# Patient Record
Sex: Male | Born: 1945 | Race: White | Hispanic: No | Marital: Married | Smoking: Former smoker
Health system: Southern US, Community
[De-identification: ages and names within clinical notes are randomized; demographics above are authoritative.]

## PROBLEM LIST (undated history)

## (undated) DIAGNOSIS — IMO0001 Reserved for inherently not codable concepts without codable children: Secondary | ICD-10-CM

## (undated) DIAGNOSIS — M1990 Unspecified osteoarthritis, unspecified site: Secondary | ICD-10-CM

## (undated) DIAGNOSIS — C9000 Multiple myeloma not having achieved remission: Secondary | ICD-10-CM

## (undated) DIAGNOSIS — K219 Gastro-esophageal reflux disease without esophagitis: Secondary | ICD-10-CM

## (undated) DIAGNOSIS — F32A Depression, unspecified: Secondary | ICD-10-CM

## (undated) DIAGNOSIS — I209 Angina pectoris, unspecified: Secondary | ICD-10-CM

## (undated) DIAGNOSIS — E785 Hyperlipidemia, unspecified: Secondary | ICD-10-CM

## (undated) DIAGNOSIS — F419 Anxiety disorder, unspecified: Secondary | ICD-10-CM

## (undated) DIAGNOSIS — I2510 Atherosclerotic heart disease of native coronary artery without angina pectoris: Secondary | ICD-10-CM

## (undated) DIAGNOSIS — E119 Type 2 diabetes mellitus without complications: Secondary | ICD-10-CM

## (undated) DIAGNOSIS — I10 Essential (primary) hypertension: Secondary | ICD-10-CM

## (undated) DIAGNOSIS — F329 Major depressive disorder, single episode, unspecified: Secondary | ICD-10-CM

## (undated) DIAGNOSIS — I509 Heart failure, unspecified: Secondary | ICD-10-CM

## (undated) DIAGNOSIS — I219 Acute myocardial infarction, unspecified: Secondary | ICD-10-CM

## (undated) DIAGNOSIS — G4730 Sleep apnea, unspecified: Secondary | ICD-10-CM

## (undated) DIAGNOSIS — M4800 Spinal stenosis, site unspecified: Secondary | ICD-10-CM

## (undated) DIAGNOSIS — K5790 Diverticulosis of intestine, part unspecified, without perforation or abscess without bleeding: Secondary | ICD-10-CM

## (undated) HISTORY — PX: CATARACT EXTRACTION: SUR2

## (undated) HISTORY — DX: Heart failure, unspecified: I50.9

## (undated) HISTORY — DX: Hyperlipidemia, unspecified: E78.5

## (undated) HISTORY — PX: EYE SURGERY: SHX253

## (undated) HISTORY — PX: CORONARY ARTERY BYPASS GRAFT: SHX141

## (undated) HISTORY — PX: TOTAL HIP ARTHROPLASTY: SHX124

## (undated) HISTORY — PX: CARDIAC CATHETERIZATION: SHX172

## (undated) HISTORY — PX: ROTATOR CUFF REPAIR: SHX139

## (undated) HISTORY — DX: Spinal stenosis, site unspecified: M48.00

## (undated) HISTORY — PX: INGUINAL HERNIA REPAIR: SUR1180

## (undated) HISTORY — DX: Essential (primary) hypertension: I10

## (undated) HISTORY — DX: Gastro-esophageal reflux disease without esophagitis: K21.9

## (undated) HISTORY — PX: CARPAL TUNNEL RELEASE: SHX101

## (undated) HISTORY — PX: PILONIDAL CYST EXCISION: SHX744

## (undated) HISTORY — DX: Unspecified osteoarthritis, unspecified site: M19.90

## (undated) HISTORY — DX: Type 2 diabetes mellitus without complications: E11.9

## (undated) HISTORY — DX: Multiple myeloma not having achieved remission: C90.00

## (undated) HISTORY — DX: Angina pectoris, unspecified: I20.9

---

## 2000-02-07 DIAGNOSIS — I219 Acute myocardial infarction, unspecified: Secondary | ICD-10-CM

## 2000-02-07 HISTORY — DX: Acute myocardial infarction, unspecified: I21.9

## 2000-03-02 ENCOUNTER — Encounter: Payer: Self-pay | Admitting: Thoracic Surgery (Cardiothoracic Vascular Surgery)

## 2000-03-02 ENCOUNTER — Inpatient Hospital Stay (HOSPITAL_COMMUNITY): Admission: EM | Admit: 2000-03-02 | Discharge: 2000-03-10 | Payer: Self-pay | Admitting: *Deleted

## 2000-03-03 ENCOUNTER — Encounter: Payer: Self-pay | Admitting: Thoracic Surgery (Cardiothoracic Vascular Surgery)

## 2000-03-04 ENCOUNTER — Encounter: Payer: Self-pay | Admitting: Thoracic Surgery (Cardiothoracic Vascular Surgery)

## 2000-03-05 ENCOUNTER — Encounter: Payer: Self-pay | Admitting: Thoracic Surgery (Cardiothoracic Vascular Surgery)

## 2000-03-06 ENCOUNTER — Encounter: Payer: Self-pay | Admitting: Thoracic Surgery (Cardiothoracic Vascular Surgery)

## 2005-09-24 ENCOUNTER — Emergency Department: Payer: Self-pay | Admitting: Emergency Medicine

## 2005-09-24 ENCOUNTER — Other Ambulatory Visit: Payer: Self-pay

## 2005-10-07 ENCOUNTER — Ambulatory Visit: Payer: Self-pay | Admitting: Emergency Medicine

## 2006-11-08 HISTORY — PX: JOINT REPLACEMENT: SHX530

## 2006-12-27 ENCOUNTER — Inpatient Hospital Stay: Payer: Self-pay | Admitting: Unknown Physician Specialty

## 2006-12-27 ENCOUNTER — Other Ambulatory Visit: Payer: Self-pay

## 2008-12-21 ENCOUNTER — Emergency Department: Payer: Self-pay

## 2009-08-12 ENCOUNTER — Ambulatory Visit: Payer: Self-pay | Admitting: Unknown Physician Specialty

## 2009-08-18 ENCOUNTER — Ambulatory Visit: Payer: Self-pay | Admitting: Unknown Physician Specialty

## 2009-08-25 ENCOUNTER — Ambulatory Visit: Payer: Self-pay | Admitting: Internal Medicine

## 2009-09-08 ENCOUNTER — Ambulatory Visit: Payer: Self-pay | Admitting: Internal Medicine

## 2009-09-24 ENCOUNTER — Ambulatory Visit: Payer: Self-pay | Admitting: Unknown Physician Specialty

## 2010-03-27 ENCOUNTER — Ambulatory Visit: Payer: Self-pay | Admitting: Surgery

## 2010-04-13 ENCOUNTER — Emergency Department: Payer: Self-pay | Admitting: Emergency Medicine

## 2012-11-17 ENCOUNTER — Ambulatory Visit: Payer: Self-pay | Admitting: Gastroenterology

## 2012-12-01 ENCOUNTER — Ambulatory Visit: Payer: Self-pay | Admitting: Unknown Physician Specialty

## 2013-02-22 ENCOUNTER — Ambulatory Visit: Payer: Self-pay | Admitting: Internal Medicine

## 2013-03-01 ENCOUNTER — Ambulatory Visit: Payer: Self-pay | Admitting: Hematology and Oncology

## 2013-03-01 LAB — CBC CANCER CENTER
Basophil #: 0.1 x10 3/mm (ref 0.0–0.1)
Eosinophil %: 1.7 %
HGB: 12.8 g/dL — ABNORMAL LOW (ref 13.0–18.0)
Lymphocyte #: 1 x10 3/mm (ref 1.0–3.6)
Lymphocyte %: 20.4 %
MCH: 36.1 pg — ABNORMAL HIGH (ref 26.0–34.0)
MCHC: 35.4 g/dL (ref 32.0–36.0)
MCV: 102 fL — ABNORMAL HIGH (ref 80–100)
Monocyte #: 0.5 x10 3/mm (ref 0.2–1.0)
Neutrophil #: 3.4 x10 3/mm (ref 1.4–6.5)
Neutrophil %: 67 %
Platelet: 199 x10 3/mm (ref 150–440)
RBC: 3.54 10*6/uL — ABNORMAL LOW (ref 4.40–5.90)
RDW: 13.9 % (ref 11.5–14.5)
WBC: 5.1 x10 3/mm (ref 3.8–10.6)

## 2013-03-01 LAB — BASIC METABOLIC PANEL
Calcium, Total: 8.8 mg/dL (ref 8.5–10.1)
EGFR (Non-African Amer.): >60
Osmolality: 276 (ref 275–301)
Potassium: 4 mmol/L (ref 3.5–5.1)
Sodium: 135 mmol/L — ABNORMAL LOW (ref 136–145)

## 2013-03-05 LAB — PROT IMMUNOELECTROPHORES(ARMC)

## 2013-03-05 LAB — KAPPA/LAMBDA FREE LIGHT CHAINS (ARMC)

## 2013-03-06 ENCOUNTER — Ambulatory Visit: Payer: Self-pay | Admitting: Hematology and Oncology

## 2013-03-08 ENCOUNTER — Ambulatory Visit: Payer: Self-pay | Admitting: Hematology and Oncology

## 2013-03-09 LAB — CBC CANCER CENTER
Bands: 1 %
Comment - H1-Com2: NORMAL
HCT: 38 % — ABNORMAL LOW (ref 40.0–52.0)
HGB: 12.8 g/dL — ABNORMAL LOW (ref 13.0–18.0)
MCH: 34.9 pg — ABNORMAL HIGH (ref 26.0–34.0)
MCV: 104 fL — ABNORMAL HIGH (ref 80–100)
Monocytes: 10 %
Platelet: 214 x10 3/mm (ref 150–440)
RDW: 14.1 % (ref 11.5–14.5)
WBC: 4.1 x10 3/mm (ref 3.8–10.6)

## 2013-04-04 LAB — CBC CANCER CENTER
Basophil #: 0.1 x10 3/mm (ref 0.0–0.1)
Basophil %: 1.2 %
Eosinophil #: 0.1 x10 3/mm (ref 0.0–0.7)
Eosinophil %: 1.5 %
Lymphocyte %: 17.3 %
MCH: 36.6 pg — ABNORMAL HIGH (ref 26.0–34.0)
MCV: 103 fL — ABNORMAL HIGH (ref 80–100)
Monocyte %: 8.9 %
Neutrophil #: 4.8 x10 3/mm (ref 1.4–6.5)
Neutrophil %: 71.1 %
Platelet: 220 x10 3/mm (ref 150–440)
RDW: 13 % (ref 11.5–14.5)
WBC: 6.7 x10 3/mm (ref 3.8–10.6)

## 2013-04-04 LAB — BASIC METABOLIC PANEL
Co2: 27 mmol/L (ref 21–32)
Creatinine: 0.96 mg/dL (ref 0.60–1.30)
EGFR (African American): >60
Osmolality: 277 (ref 275–301)
Potassium: 4 mmol/L (ref 3.5–5.1)
Sodium: 134 mmol/L — ABNORMAL LOW (ref 136–145)

## 2013-04-08 ENCOUNTER — Ambulatory Visit: Payer: Self-pay | Admitting: Hematology and Oncology

## 2013-05-01 LAB — COMPREHENSIVE METABOLIC PANEL
Albumin: 3.3 g/dL — ABNORMAL LOW (ref 3.4–5.0)
Anion Gap: 8 (ref 7–16)
Bilirubin,Total: 0.4 mg/dL (ref 0.2–1.0)
Chloride: 97 mmol/L — ABNORMAL LOW (ref 98–107)
Co2: 27 mmol/L (ref 21–32)
Creatinine: 1.02 mg/dL (ref 0.60–1.30)
EGFR (African American): >60
EGFR (Non-African Amer.): >60
Glucose: 240 mg/dL — ABNORMAL HIGH (ref 65–99)
Potassium: 4.6 mmol/L (ref 3.5–5.1)
SGOT(AST): 23 U/L (ref 15–37)
SGPT (ALT): 44 U/L (ref 12–78)
Sodium: 132 mmol/L — ABNORMAL LOW (ref 136–145)

## 2013-05-01 LAB — CBC CANCER CENTER
Basophil %: 1.3 %
Eosinophil %: 10.5 %
HGB: 13 g/dL (ref 13.0–18.0)
Lymphocyte #: 0.7 x10 3/mm — ABNORMAL LOW (ref 1.0–3.6)
Lymphocyte %: 18.8 %
MCH: 36 pg — ABNORMAL HIGH (ref 26.0–34.0)
MCHC: 35.4 g/dL (ref 32.0–36.0)
Monocyte %: 13.6 %
RDW: 12.7 % (ref 11.5–14.5)
WBC: 3.6 x10 3/mm — ABNORMAL LOW (ref 3.8–10.6)

## 2013-05-08 ENCOUNTER — Ambulatory Visit: Payer: Self-pay | Admitting: Hematology and Oncology

## 2013-05-29 LAB — COMPREHENSIVE METABOLIC PANEL
Albumin: 3.4 g/dL (ref 3.4–5.0)
BUN: 14 mg/dL (ref 7–18)
Calcium, Total: 8.5 mg/dL (ref 8.5–10.1)
Co2: 28 mmol/L (ref 21–32)
Potassium: 4.7 mmol/L (ref 3.5–5.1)
SGOT(AST): 17 U/L (ref 15–37)
Sodium: 133 mmol/L — ABNORMAL LOW (ref 136–145)

## 2013-05-29 LAB — CBC CANCER CENTER
Basophil #: 0.2 x10 3/mm — ABNORMAL HIGH (ref 0.0–0.1)
Lymphocyte %: 14.3 %
MCH: 35.8 pg — ABNORMAL HIGH (ref 26.0–34.0)
MCHC: 35.7 g/dL (ref 32.0–36.0)
MCV: 100 fL (ref 80–100)
Monocyte #: 0.5 x10 3/mm (ref 0.2–1.0)
RDW: 13.5 % (ref 11.5–14.5)
WBC: 4.7 x10 3/mm (ref 3.8–10.6)

## 2013-05-30 LAB — PROT IMMUNOELECTROPHORES(ARMC)

## 2013-06-08 ENCOUNTER — Ambulatory Visit: Payer: Self-pay | Admitting: Hematology and Oncology

## 2013-07-03 LAB — CBC CANCER CENTER
Basophil #: 0 x10 3/mm (ref 0.0–0.1)
Basophil %: 0.4 %
Eosinophil #: 0 x10 3/mm (ref 0.0–0.7)
HCT: 35 % — ABNORMAL LOW (ref 40.0–52.0)
HGB: 12.4 g/dL — ABNORMAL LOW (ref 13.0–18.0)
Lymphocyte %: 10.2 %
MCHC: 35.5 g/dL (ref 32.0–36.0)
Monocyte #: 0.7 x10 3/mm (ref 0.2–1.0)
Monocyte %: 12.4 %
Neutrophil %: 77 %
Platelet: 227 x10 3/mm (ref 150–440)
RBC: 3.55 10*6/uL — ABNORMAL LOW (ref 4.40–5.90)
WBC: 5.5 x10 3/mm (ref 3.8–10.6)

## 2013-07-03 LAB — COMPREHENSIVE METABOLIC PANEL
Alkaline Phosphatase: 70 U/L (ref 50–136)
Calcium, Total: 8.5 mg/dL (ref 8.5–10.1)
Creatinine: 1.16 mg/dL (ref 0.60–1.30)
Glucose: 379 mg/dL — ABNORMAL HIGH (ref 65–99)
Osmolality: 288 (ref 275–301)
Total Protein: 7.2 g/dL (ref 6.4–8.2)

## 2013-07-05 LAB — PROT IMMUNOELECTROPHORES(ARMC)

## 2013-07-09 ENCOUNTER — Ambulatory Visit: Payer: Self-pay | Admitting: Hematology and Oncology

## 2013-07-31 LAB — CBC CANCER CENTER
Basophil #: 0.1 x10 3/mm (ref 0.0–0.1)
Basophil %: 2.5 %
Eosinophil #: 0.3 x10 3/mm (ref 0.0–0.7)
HGB: 12.2 g/dL — ABNORMAL LOW (ref 13.0–18.0)
Lymphocyte #: 0.8 x10 3/mm — ABNORMAL LOW (ref 1.0–3.6)
MCHC: 34.4 g/dL (ref 32.0–36.0)
Monocyte #: 0.6 x10 3/mm (ref 0.2–1.0)
Monocyte %: 18 %
Platelet: 192 x10 3/mm (ref 150–440)
RBC: 3.56 10*6/uL — ABNORMAL LOW (ref 4.40–5.90)
RDW: 14.7 % — ABNORMAL HIGH (ref 11.5–14.5)
WBC: 3.2 x10 3/mm — ABNORMAL LOW (ref 3.8–10.6)

## 2013-07-31 LAB — BASIC METABOLIC PANEL
Calcium, Total: 8.4 mg/dL — ABNORMAL LOW (ref 8.5–10.1)
Creatinine: 0.79 mg/dL (ref 0.60–1.30)
EGFR (African American): >60
EGFR (Non-African Amer.): >60
Glucose: 213 mg/dL — ABNORMAL HIGH (ref 65–99)
Osmolality: 284 (ref 275–301)
Potassium: 3.9 mmol/L (ref 3.5–5.1)
Sodium: 140 mmol/L (ref 136–145)

## 2013-08-06 LAB — CBC
HCT: 35.8 % — ABNORMAL LOW (ref 40.0–52.0)
HGB: 12.7 g/dL — ABNORMAL LOW (ref 13.0–18.0)
MCH: 34.2 pg — ABNORMAL HIGH (ref 26.0–34.0)
MCHC: 35.4 g/dL (ref 32.0–36.0)
Platelet: 210 10*3/uL (ref 150–440)
WBC: 4.8 10*3/uL (ref 3.8–10.6)

## 2013-08-07 ENCOUNTER — Inpatient Hospital Stay: Payer: Self-pay | Admitting: Internal Medicine

## 2013-08-07 LAB — TROPONIN I: Troponin-I: <0.02 ng/mL

## 2013-08-07 LAB — URINALYSIS, COMPLETE
Bilirubin,UR: NEGATIVE
Blood: NEGATIVE
Glucose,UR: 50 mg/dL (ref 0–75)
Leukocyte Esterase: NEGATIVE
Nitrite: NEGATIVE
Ph: 5 (ref 4.5–8.0)
RBC,UR: 1 /HPF (ref 0–5)
Specific Gravity: 1.023 (ref 1.003–1.030)
WBC UR: 1 /HPF (ref 0–5)

## 2013-08-07 LAB — COMPREHENSIVE METABOLIC PANEL
Albumin: 3.3 g/dL — ABNORMAL LOW (ref 3.4–5.0)
Alkaline Phosphatase: 63 U/L (ref 50–136)
Anion Gap: 8 (ref 7–16)
BUN: 13 mg/dL (ref 7–18)
Bilirubin,Total: 0.2 mg/dL (ref 0.2–1.0)
Calcium, Total: 8.7 mg/dL (ref 8.5–10.1)
Chloride: 104 mmol/L (ref 98–107)
Co2: 23 mmol/L (ref 21–32)
EGFR (African American): >60
EGFR (Non-African Amer.): >60
SGPT (ALT): 25 U/L (ref 12–78)
Sodium: 135 mmol/L — ABNORMAL LOW (ref 136–145)
Total Protein: 7.2 g/dL (ref 6.4–8.2)

## 2013-08-07 LAB — AMYLASE: Amylase: 107 U/L (ref 25–115)

## 2013-08-07 LAB — CK TOTAL AND CKMB (NOT AT ARMC): CK, Total: 65 U/L (ref 35–232)

## 2013-08-08 ENCOUNTER — Ambulatory Visit: Payer: Self-pay | Admitting: Hematology and Oncology

## 2013-08-08 LAB — CBC WITH DIFFERENTIAL/PLATELET
Basophil #: 0.1 10*3/uL (ref 0.0–0.1)
Eosinophil #: 0 10*3/uL (ref 0.0–0.7)
HCT: 34.9 % — ABNORMAL LOW (ref 40.0–52.0)
Lymphocyte #: 0.6 10*3/uL — ABNORMAL LOW (ref 1.0–3.6)
Lymphocyte %: 7.5 %
MCH: 34.5 pg — ABNORMAL HIGH (ref 26.0–34.0)
MCHC: 34.9 g/dL (ref 32.0–36.0)
MCV: 99 fL (ref 80–100)
Monocyte #: 0.7 x10 3/mm (ref 0.2–1.0)
Monocyte %: 10 %
Neutrophil %: 80.8 %
Platelet: 178 10*3/uL (ref 150–440)
RBC: 3.53 10*6/uL — ABNORMAL LOW (ref 4.40–5.90)
RDW: 15.1 % — ABNORMAL HIGH (ref 11.5–14.5)
WBC: 7.5 10*3/uL (ref 3.8–10.6)

## 2013-08-08 LAB — BASIC METABOLIC PANEL
Anion Gap: 6 — ABNORMAL LOW (ref 7–16)
BUN: 7 mg/dL (ref 7–18)
Calcium, Total: 7.7 mg/dL — ABNORMAL LOW (ref 8.5–10.1)
Co2: 25 mmol/L (ref 21–32)
EGFR (Non-African Amer.): >60
Glucose: 135 mg/dL — ABNORMAL HIGH (ref 65–99)
Osmolality: 272 (ref 275–301)
Potassium: 4 mmol/L (ref 3.5–5.1)
Sodium: 136 mmol/L (ref 136–145)

## 2013-08-09 LAB — CBC WITH DIFFERENTIAL/PLATELET
Basophil #: 0.1 10*3/uL (ref 0.0–0.1)
Basophil %: 1.2 %
Eosinophil #: 0.1 10*3/uL (ref 0.0–0.7)
Eosinophil %: 2 %
Lymphocyte #: 0.6 10*3/uL — ABNORMAL LOW (ref 1.0–3.6)
Lymphocyte %: 8.7 %
MCHC: 35.2 g/dL (ref 32.0–36.0)
MCV: 98 fL (ref 80–100)
Monocyte #: 0.5 x10 3/mm (ref 0.2–1.0)
Monocyte %: 6.8 %
Neutrophil %: 81.3 %
Platelet: 173 10*3/uL (ref 150–440)
RBC: 3.32 10*6/uL — ABNORMAL LOW (ref 4.40–5.90)

## 2013-08-09 LAB — BASIC METABOLIC PANEL
Co2: 24 mmol/L (ref 21–32)
Creatinine: 0.79 mg/dL (ref 0.60–1.30)
EGFR (African American): >60
Glucose: 97 mg/dL (ref 65–99)
Osmolality: 275 (ref 275–301)
Sodium: 139 mmol/L (ref 136–145)

## 2013-08-14 LAB — BASIC METABOLIC PANEL
Anion Gap: 10 (ref 7–16)
BUN: 12 mg/dL (ref 7–18)
Chloride: 102 mmol/L (ref 98–107)
Co2: 27 mmol/L (ref 21–32)
EGFR (Non-African Amer.): >60
Glucose: 184 mg/dL — ABNORMAL HIGH (ref 65–99)
Potassium: 3.1 mmol/L — ABNORMAL LOW (ref 3.5–5.1)
Sodium: 139 mmol/L (ref 136–145)

## 2013-08-14 LAB — CBC CANCER CENTER
Basophil %: 1.1 %
Eosinophil #: 0.3 x10 3/mm (ref 0.0–0.7)
Eosinophil %: 5.1 %
HCT: 37.1 % — ABNORMAL LOW (ref 40.0–52.0)
Lymphocyte #: 0.7 x10 3/mm — ABNORMAL LOW (ref 1.0–3.6)
Lymphocyte %: 12.4 %
MCH: 33.9 pg (ref 26.0–34.0)
MCHC: 34.9 g/dL (ref 32.0–36.0)
MCV: 97 fL (ref 80–100)
Monocyte %: 10.3 %
Platelet: 326 x10 3/mm (ref 150–440)
WBC: 5.5 x10 3/mm (ref 3.8–10.6)

## 2013-08-30 LAB — CBC CANCER CENTER
Basophil %: 3.4 %
HCT: 39.6 % — ABNORMAL LOW (ref 40.0–52.0)
HGB: 13.7 g/dL (ref 13.0–18.0)
Lymphocyte %: 24.5 %
MCH: 33.6 pg (ref 26.0–34.0)
Monocyte #: 0.7 x10 3/mm (ref 0.2–1.0)
Neutrophil #: 1.6 x10 3/mm (ref 1.4–6.5)
Neutrophil %: 41.4 %
Platelet: 207 x10 3/mm (ref 150–440)
RBC: 4.07 10*6/uL — ABNORMAL LOW (ref 4.40–5.90)
WBC: 3.8 x10 3/mm (ref 3.8–10.6)

## 2013-08-30 LAB — BASIC METABOLIC PANEL
Anion Gap: 12 (ref 7–16)
Calcium, Total: 7.8 mg/dL — ABNORMAL LOW (ref 8.5–10.1)
Chloride: 102 mmol/L (ref 98–107)
EGFR (Non-African Amer.): >60
Glucose: 140 mg/dL — ABNORMAL HIGH (ref 65–99)
Osmolality: 277 (ref 275–301)
Potassium: 4.1 mmol/L (ref 3.5–5.1)

## 2013-09-03 LAB — PROT IMMUNOELECTROPHORES(ARMC)

## 2013-09-08 ENCOUNTER — Ambulatory Visit: Payer: Self-pay | Admitting: Hematology and Oncology

## 2013-10-03 LAB — CBC CANCER CENTER
Basophil #: 0.2 x10 3/mm — ABNORMAL HIGH (ref 0.0–0.1)
Eosinophil #: 0.6 x10 3/mm (ref 0.0–0.7)
HGB: 13.3 g/dL (ref 13.0–18.0)
Lymphocyte #: 1.1 x10 3/mm (ref 1.0–3.6)
MCH: 32.9 pg (ref 26.0–34.0)
MCHC: 33.4 g/dL (ref 32.0–36.0)
MCV: 99 fL (ref 80–100)
Monocyte %: 14.2 %
Neutrophil %: 29.4 %
Platelet: 168 x10 3/mm (ref 150–440)
RBC: 4.05 10*6/uL — ABNORMAL LOW (ref 4.40–5.90)
RDW: 15.7 % — ABNORMAL HIGH (ref 11.5–14.5)
WBC: 3.2 x10 3/mm — ABNORMAL LOW (ref 3.8–10.6)

## 2013-10-03 LAB — COMPREHENSIVE METABOLIC PANEL
Alkaline Phosphatase: 48 U/L
Anion Gap: 12 (ref 7–16)
BUN: 13 mg/dL (ref 7–18)
Calcium, Total: 8.4 mg/dL — ABNORMAL LOW (ref 8.5–10.1)
EGFR (African American): >60
EGFR (Non-African Amer.): >60
Glucose: 114 mg/dL — ABNORMAL HIGH (ref 65–99)

## 2013-10-08 ENCOUNTER — Ambulatory Visit: Payer: Self-pay | Admitting: Hematology and Oncology

## 2013-10-30 LAB — BASIC METABOLIC PANEL
Chloride: 103 mmol/L (ref 98–107)
Creatinine: 0.84 mg/dL (ref 0.60–1.30)
EGFR (Non-African Amer.): >60
Glucose: 121 mg/dL — ABNORMAL HIGH (ref 65–99)
Osmolality: 279 (ref 275–301)
Potassium: 3.9 mmol/L (ref 3.5–5.1)
Sodium: 139 mmol/L (ref 136–145)

## 2013-10-30 LAB — CBC CANCER CENTER
Basophil #: 0.1 x10 3/mm (ref 0.0–0.1)
Basophil %: 4.4 %
Eosinophil %: 13.1 %
HGB: 14 g/dL (ref 13.0–18.0)
Lymphocyte %: 35.6 %
MCHC: 33.5 g/dL (ref 32.0–36.0)
MCV: 99 fL (ref 80–100)
Monocyte #: 0.5 x10 3/mm (ref 0.2–1.0)
Monocyte %: 17.9 %
Neutrophil #: 0.9 x10 3/mm — ABNORMAL LOW (ref 1.4–6.5)
Neutrophil %: 29 %
Platelet: 203 x10 3/mm (ref 150–440)
RDW: 16.2 % — ABNORMAL HIGH (ref 11.5–14.5)
WBC: 3 x10 3/mm — ABNORMAL LOW (ref 3.8–10.6)

## 2013-11-02 LAB — PROT IMMUNOELECTROPHORES(ARMC)

## 2013-11-08 ENCOUNTER — Ambulatory Visit: Payer: Self-pay | Admitting: Hematology and Oncology

## 2013-11-27 LAB — COMPREHENSIVE METABOLIC PANEL
ALT: 36 U/L (ref 12–78)
Albumin: 3.5 g/dL (ref 3.4–5.0)
Alkaline Phosphatase: 70 U/L
Anion Gap: 4 — ABNORMAL LOW (ref 7–16)
BUN: 12 mg/dL (ref 7–18)
Bilirubin,Total: 0.3 mg/dL (ref 0.2–1.0)
CHLORIDE: 106 mmol/L (ref 98–107)
CO2: 25 mmol/L (ref 21–32)
Calcium, Total: 8.2 mg/dL — ABNORMAL LOW (ref 8.5–10.1)
Creatinine: 0.84 mg/dL (ref 0.60–1.30)
Glucose: 99 mg/dL (ref 65–99)
Osmolality: 270 (ref 275–301)
POTASSIUM: 4 mmol/L (ref 3.5–5.1)
SGOT(AST): 40 U/L — ABNORMAL HIGH (ref 15–37)
Sodium: 135 mmol/L — ABNORMAL LOW (ref 136–145)
Total Protein: 8.1 g/dL (ref 6.4–8.2)

## 2013-11-27 LAB — CBC CANCER CENTER
BASOS PCT: 3.9 %
Basophil #: 0.2 x10 3/mm — ABNORMAL HIGH (ref 0.0–0.1)
EOS ABS: 0.9 x10 3/mm — AB (ref 0.0–0.7)
Eosinophil %: 21.3 %
HCT: 40.8 % (ref 40.0–52.0)
HGB: 14 g/dL (ref 13.0–18.0)
LYMPHS ABS: 1.1 x10 3/mm (ref 1.0–3.6)
Lymphocyte %: 26.1 %
MCH: 34.2 pg — ABNORMAL HIGH (ref 26.0–34.0)
MCHC: 34.4 g/dL (ref 32.0–36.0)
MCV: 100 fL (ref 80–100)
Monocyte #: 0.6 x10 3/mm (ref 0.2–1.0)
Monocyte %: 15.6 %
NEUTROS PCT: 33.1 %
Neutrophil #: 1.4 x10 3/mm (ref 1.4–6.5)
Platelet: 196 x10 3/mm (ref 150–440)
RBC: 4.1 10*6/uL — ABNORMAL LOW (ref 4.40–5.90)
RDW: 15.7 % — ABNORMAL HIGH (ref 11.5–14.5)
WBC: 4.1 x10 3/mm (ref 3.8–10.6)

## 2013-11-29 LAB — PROT IMMUNOELECTROPHORES(ARMC)

## 2013-12-09 ENCOUNTER — Ambulatory Visit: Payer: Self-pay | Admitting: Hematology and Oncology

## 2013-12-28 LAB — CBC CANCER CENTER
BASOS PCT: 0.6 %
Basophil #: 0 x10 3/mm (ref 0.0–0.1)
Eosinophil #: 0.5 x10 3/mm (ref 0.0–0.7)
Eosinophil %: 13.8 %
HCT: 40.5 % (ref 40.0–52.0)
HGB: 13.6 g/dL (ref 13.0–18.0)
Lymphocyte #: 1.1 x10 3/mm (ref 1.0–3.6)
Lymphocyte %: 32.6 %
MCH: 34.2 pg — ABNORMAL HIGH (ref 26.0–34.0)
MCHC: 33.7 g/dL (ref 32.0–36.0)
MCV: 102 fL — ABNORMAL HIGH (ref 80–100)
Monocyte #: 0.4 x10 3/mm (ref 0.2–1.0)
Monocyte %: 12.3 %
Neutrophil #: 1.4 x10 3/mm (ref 1.4–6.5)
Neutrophil %: 40.7 %
Platelet: 167 x10 3/mm (ref 150–440)
RBC: 3.98 10*6/uL — AB (ref 4.40–5.90)
RDW: 14.7 % — ABNORMAL HIGH (ref 11.5–14.5)
WBC: 3.4 x10 3/mm — ABNORMAL LOW (ref 3.8–10.6)

## 2013-12-28 LAB — COMPREHENSIVE METABOLIC PANEL
ALK PHOS: 56 U/L
AST: 26 U/L (ref 15–37)
Albumin: 3.6 g/dL (ref 3.4–5.0)
Anion Gap: 9 (ref 7–16)
BUN: 10 mg/dL (ref 7–18)
Bilirubin,Total: 0.4 mg/dL (ref 0.2–1.0)
CALCIUM: 8 mg/dL — AB (ref 8.5–10.1)
Chloride: 103 mmol/L (ref 98–107)
Co2: 27 mmol/L (ref 21–32)
Creatinine: 0.84 mg/dL (ref 0.60–1.30)
EGFR (African American): >60
EGFR (Non-African Amer.): >60
Glucose: 77 mg/dL (ref 65–99)
Osmolality: 275 (ref 275–301)
POTASSIUM: 4.6 mmol/L (ref 3.5–5.1)
SGPT (ALT): 29 U/L (ref 12–78)
Sodium: 139 mmol/L (ref 136–145)
Total Protein: 8.1 g/dL (ref 6.4–8.2)

## 2014-01-06 ENCOUNTER — Ambulatory Visit: Payer: Self-pay | Admitting: Hematology and Oncology

## 2014-01-11 DIAGNOSIS — I25810 Atherosclerosis of coronary artery bypass graft(s) without angina pectoris: Secondary | ICD-10-CM | POA: Insufficient documentation

## 2014-01-11 DIAGNOSIS — K219 Gastro-esophageal reflux disease without esophagitis: Secondary | ICD-10-CM | POA: Insufficient documentation

## 2014-01-11 DIAGNOSIS — F17200 Nicotine dependence, unspecified, uncomplicated: Secondary | ICD-10-CM | POA: Insufficient documentation

## 2014-01-11 DIAGNOSIS — E782 Mixed hyperlipidemia: Secondary | ICD-10-CM | POA: Insufficient documentation

## 2014-01-25 LAB — CBC CANCER CENTER
Basophil #: 0.1 x10 3/mm (ref 0.0–0.1)
Basophil %: 4.2 %
Eosinophil #: 0.4 x10 3/mm (ref 0.0–0.7)
Eosinophil %: 13.7 %
HCT: 40 % (ref 40.0–52.0)
HGB: 13.5 g/dL (ref 13.0–18.0)
Lymphocyte #: 0.9 x10 3/mm — ABNORMAL LOW (ref 1.0–3.6)
Lymphocyte %: 32.3 %
MCH: 35.3 pg — AB (ref 26.0–34.0)
MCHC: 33.7 g/dL (ref 32.0–36.0)
MCV: 105 fL — ABNORMAL HIGH (ref 80–100)
Monocyte #: 0.4 x10 3/mm (ref 0.2–1.0)
Monocyte %: 14 %
NEUTROS ABS: 1 x10 3/mm — AB (ref 1.4–6.5)
Neutrophil %: 35.8 %
Platelet: 191 x10 3/mm (ref 150–440)
RBC: 3.82 10*6/uL — ABNORMAL LOW (ref 4.40–5.90)
RDW: 14.4 % (ref 11.5–14.5)
WBC: 2.9 x10 3/mm — AB (ref 3.8–10.6)

## 2014-01-25 LAB — BASIC METABOLIC PANEL
Anion Gap: 4 — ABNORMAL LOW (ref 7–16)
BUN: 12 mg/dL (ref 7–18)
CALCIUM: 8.2 mg/dL — AB (ref 8.5–10.1)
Chloride: 105 mmol/L (ref 98–107)
Co2: 28 mmol/L (ref 21–32)
Creatinine: 0.82 mg/dL (ref 0.60–1.30)
EGFR (Non-African Amer.): >60
GLUCOSE: 113 mg/dL — AB (ref 65–99)
OSMOLALITY: 274 (ref 275–301)
POTASSIUM: 4.1 mmol/L (ref 3.5–5.1)
Sodium: 137 mmol/L (ref 136–145)

## 2014-01-28 LAB — PROT IMMUNOELECTROPHORES(ARMC)

## 2014-02-06 ENCOUNTER — Ambulatory Visit: Payer: Self-pay | Admitting: Hematology and Oncology

## 2014-02-19 DIAGNOSIS — C4492 Squamous cell carcinoma of skin, unspecified: Secondary | ICD-10-CM

## 2014-02-19 HISTORY — DX: Squamous cell carcinoma of skin, unspecified: C44.92

## 2014-02-28 LAB — BASIC METABOLIC PANEL
Anion Gap: 13 (ref 7–16)
BUN: 13 mg/dL (ref 7–18)
CALCIUM: 8.8 mg/dL (ref 8.5–10.1)
CREATININE: 0.85 mg/dL (ref 0.60–1.30)
Chloride: 104 mmol/L (ref 98–107)
Co2: 24 mmol/L (ref 21–32)
EGFR (African American): >60
EGFR (Non-African Amer.): >60
Glucose: 94 mg/dL (ref 65–99)
Osmolality: 281 (ref 275–301)
Potassium: 4.2 mmol/L (ref 3.5–5.1)
Sodium: 141 mmol/L (ref 136–145)

## 2014-02-28 LAB — CBC CANCER CENTER
BASOS ABS: 0.1 x10 3/mm (ref 0.0–0.1)
BASOS PCT: 4.3 %
Eosinophil #: 0.4 x10 3/mm (ref 0.0–0.7)
Eosinophil %: 15.2 %
HCT: 39.7 % — ABNORMAL LOW (ref 40.0–52.0)
HGB: 13.5 g/dL (ref 13.0–18.0)
LYMPHS ABS: 0.9 x10 3/mm — AB (ref 1.0–3.6)
LYMPHS PCT: 31.2 %
MCH: 36.1 pg — AB (ref 26.0–34.0)
MCHC: 34 g/dL (ref 32.0–36.0)
MCV: 106 fL — AB (ref 80–100)
MONO ABS: 0.4 x10 3/mm (ref 0.2–1.0)
Monocyte %: 12.7 %
NEUTROS PCT: 36.6 %
Neutrophil #: 1.1 x10 3/mm — ABNORMAL LOW (ref 1.4–6.5)
Platelet: 169 x10 3/mm (ref 150–440)
RBC: 3.74 10*6/uL — AB (ref 4.40–5.90)
RDW: 15.2 % — AB (ref 11.5–14.5)
WBC: 2.9 x10 3/mm — ABNORMAL LOW (ref 3.8–10.6)

## 2014-03-08 ENCOUNTER — Ambulatory Visit: Payer: Self-pay | Admitting: Hematology and Oncology

## 2014-03-14 DIAGNOSIS — R5383 Other fatigue: Secondary | ICD-10-CM | POA: Insufficient documentation

## 2014-03-14 DIAGNOSIS — R079 Chest pain, unspecified: Secondary | ICD-10-CM | POA: Insufficient documentation

## 2014-03-14 DIAGNOSIS — R531 Weakness: Secondary | ICD-10-CM | POA: Insufficient documentation

## 2014-03-14 DIAGNOSIS — R0602 Shortness of breath: Secondary | ICD-10-CM | POA: Insufficient documentation

## 2014-03-28 DIAGNOSIS — M1990 Unspecified osteoarthritis, unspecified site: Secondary | ICD-10-CM | POA: Insufficient documentation

## 2014-03-29 LAB — CBC CANCER CENTER
Basophil #: 0 x10 3/mm (ref 0.0–0.1)
Basophil %: 1.3 %
EOS PCT: 0.3 %
Eosinophil #: 0 x10 3/mm (ref 0.0–0.7)
HCT: 37.4 % — AB (ref 40.0–52.0)
HGB: 13.2 g/dL (ref 13.0–18.0)
Lymphocyte #: 0.5 x10 3/mm — ABNORMAL LOW (ref 1.0–3.6)
Lymphocyte %: 17.5 %
MCH: 36.8 pg — ABNORMAL HIGH (ref 26.0–34.0)
MCHC: 35.2 g/dL (ref 32.0–36.0)
MCV: 105 fL — AB (ref 80–100)
MONO ABS: 0.4 x10 3/mm (ref 0.2–1.0)
MONOS PCT: 14.6 %
Neutrophil #: 2 x10 3/mm (ref 1.4–6.5)
Neutrophil %: 66.3 %
Platelet: 207 x10 3/mm (ref 150–440)
RBC: 3.58 10*6/uL — ABNORMAL LOW (ref 4.40–5.90)
RDW: 13.5 % (ref 11.5–14.5)
WBC: 3 x10 3/mm — ABNORMAL LOW (ref 3.8–10.6)

## 2014-03-29 LAB — BASIC METABOLIC PANEL
ANION GAP: 11 (ref 7–16)
BUN: 18 mg/dL (ref 7–18)
CALCIUM: 8.7 mg/dL (ref 8.5–10.1)
CO2: 25 mmol/L (ref 21–32)
Chloride: 101 mmol/L (ref 98–107)
Creatinine: 0.96 mg/dL (ref 0.60–1.30)
EGFR (African American): >60
EGFR (Non-African Amer.): >60
Glucose: 153 mg/dL — ABNORMAL HIGH (ref 65–99)
Osmolality: 279 (ref 275–301)
POTASSIUM: 4.8 mmol/L (ref 3.5–5.1)
SODIUM: 137 mmol/L (ref 136–145)

## 2014-04-08 ENCOUNTER — Ambulatory Visit: Payer: Self-pay | Admitting: Hematology and Oncology

## 2014-05-03 LAB — COMPREHENSIVE METABOLIC PANEL
ALK PHOS: 49 U/L
AST: 22 U/L (ref 15–37)
Albumin: 3.5 g/dL (ref 3.4–5.0)
Anion Gap: 10 (ref 7–16)
BUN: 11 mg/dL (ref 7–18)
Bilirubin,Total: 0.4 mg/dL (ref 0.2–1.0)
CALCIUM: 8.7 mg/dL (ref 8.5–10.1)
Chloride: 102 mmol/L (ref 98–107)
Co2: 27 mmol/L (ref 21–32)
Creatinine: 0.9 mg/dL (ref 0.60–1.30)
EGFR (African American): >60
EGFR (Non-African Amer.): >60
GLUCOSE: 127 mg/dL — AB (ref 65–99)
OSMOLALITY: 279 (ref 275–301)
POTASSIUM: 4.5 mmol/L (ref 3.5–5.1)
SGPT (ALT): 31 U/L (ref 12–78)
Sodium: 139 mmol/L (ref 136–145)
Total Protein: 7.9 g/dL (ref 6.4–8.2)

## 2014-05-03 LAB — CBC CANCER CENTER
BASOS ABS: 0.1 x10 3/mm (ref 0.0–0.1)
Basophil %: 4.5 %
Eosinophil #: 0.3 x10 3/mm (ref 0.0–0.7)
Eosinophil %: 12.6 %
HCT: 38.9 % — ABNORMAL LOW (ref 40.0–52.0)
HGB: 13.1 g/dL (ref 13.0–18.0)
LYMPHS PCT: 29.7 %
Lymphocyte #: 0.8 x10 3/mm — ABNORMAL LOW (ref 1.0–3.6)
MCH: 36.6 pg — ABNORMAL HIGH (ref 26.0–34.0)
MCHC: 33.7 g/dL (ref 32.0–36.0)
MCV: 108 fL — ABNORMAL HIGH (ref 80–100)
MONO ABS: 0.3 x10 3/mm (ref 0.2–1.0)
Monocyte %: 12.6 %
NEUTROS PCT: 40.6 %
Neutrophil #: 1 x10 3/mm — ABNORMAL LOW (ref 1.4–6.5)
PLATELETS: 159 x10 3/mm (ref 150–440)
RBC: 3.59 10*6/uL — AB (ref 4.40–5.90)
RDW: 14.1 % (ref 11.5–14.5)
WBC: 2.5 x10 3/mm — ABNORMAL LOW (ref 3.8–10.6)

## 2014-05-03 LAB — LACTATE DEHYDROGENASE: LDH: 163 U/L (ref 85–241)

## 2014-05-03 LAB — PROTIME-INR
INR: 1
Prothrombin Time: 12.7 secs (ref 11.5–14.7)

## 2014-05-03 LAB — FOLATE: FOLIC ACID: 11.4 ng/mL (ref 3.1–100.0)

## 2014-05-07 LAB — PROT IMMUNOELECTROPHORES(ARMC)

## 2014-05-08 ENCOUNTER — Ambulatory Visit: Payer: Self-pay | Admitting: Hematology and Oncology

## 2014-05-24 LAB — COMPREHENSIVE METABOLIC PANEL
ALK PHOS: 54 U/L
ANION GAP: 10 (ref 7–16)
Albumin: 3.6 g/dL (ref 3.4–5.0)
BUN: 15 mg/dL (ref 7–18)
Bilirubin,Total: 0.5 mg/dL (ref 0.2–1.0)
CALCIUM: 9.1 mg/dL (ref 8.5–10.1)
CREATININE: 0.96 mg/dL (ref 0.60–1.30)
Chloride: 101 mmol/L (ref 98–107)
Co2: 26 mmol/L (ref 21–32)
EGFR (African American): >60
EGFR (Non-African Amer.): >60
Glucose: 120 mg/dL — ABNORMAL HIGH (ref 65–99)
OSMOLALITY: 276 (ref 275–301)
POTASSIUM: 4.6 mmol/L (ref 3.5–5.1)
SGOT(AST): 27 U/L (ref 15–37)
SGPT (ALT): 28 U/L (ref 12–78)
SODIUM: 137 mmol/L (ref 136–145)
TOTAL PROTEIN: 8.1 g/dL (ref 6.4–8.2)

## 2014-05-24 LAB — CBC CANCER CENTER
Basophil #: 0 x10 3/mm (ref 0.0–0.1)
Basophil %: 1.4 %
Eosinophil #: 0.1 x10 3/mm (ref 0.0–0.7)
Eosinophil %: 3.5 %
HCT: 38.3 % — ABNORMAL LOW (ref 40.0–52.0)
HGB: 13.2 g/dL (ref 13.0–18.0)
LYMPHS PCT: 34.2 %
Lymphocyte #: 1.2 x10 3/mm (ref 1.0–3.6)
MCH: 37.1 pg — ABNORMAL HIGH (ref 26.0–34.0)
MCHC: 34.4 g/dL (ref 32.0–36.0)
MCV: 108 fL — ABNORMAL HIGH (ref 80–100)
Monocyte #: 0.4 x10 3/mm (ref 0.2–1.0)
Monocyte %: 11.6 %
NEUTROS PCT: 49.3 %
Neutrophil #: 1.7 x10 3/mm (ref 1.4–6.5)
Platelet: 179 x10 3/mm (ref 150–440)
RBC: 3.55 10*6/uL — ABNORMAL LOW (ref 4.40–5.90)
RDW: 14 % (ref 11.5–14.5)
WBC: 3.5 x10 3/mm — ABNORMAL LOW (ref 3.8–10.6)

## 2014-05-31 LAB — BASIC METABOLIC PANEL
Anion Gap: 12 (ref 7–16)
BUN: 13 mg/dL (ref 7–18)
CO2: 24 mmol/L (ref 21–32)
CREATININE: 1.28 mg/dL (ref 0.60–1.30)
Calcium, Total: 9.3 mg/dL (ref 8.5–10.1)
Chloride: 102 mmol/L (ref 98–107)
EGFR (Non-African Amer.): 57 — ABNORMAL LOW
Glucose: 143 mg/dL — ABNORMAL HIGH (ref 65–99)
OSMOLALITY: 278 (ref 275–301)
POTASSIUM: 4.6 mmol/L (ref 3.5–5.1)
SODIUM: 138 mmol/L (ref 136–145)

## 2014-05-31 LAB — CBC CANCER CENTER
Basophil #: 0 x10 3/mm (ref 0.0–0.1)
Basophil %: 0.9 %
EOS ABS: 0.3 x10 3/mm (ref 0.0–0.7)
EOS PCT: 6.1 %
HCT: 38.1 % — ABNORMAL LOW (ref 40.0–52.0)
HGB: 13.1 g/dL (ref 13.0–18.0)
LYMPHS ABS: 1 x10 3/mm (ref 1.0–3.6)
Lymphocyte %: 24 %
MCH: 37 pg — AB (ref 26.0–34.0)
MCHC: 34.5 g/dL (ref 32.0–36.0)
MCV: 107 fL — AB (ref 80–100)
MONO ABS: 0.4 x10 3/mm (ref 0.2–1.0)
Monocyte %: 10.1 %
NEUTROS ABS: 2.4 x10 3/mm (ref 1.4–6.5)
Neutrophil %: 58.9 %
PLATELETS: 221 x10 3/mm (ref 150–440)
RBC: 3.55 10*6/uL — ABNORMAL LOW (ref 4.40–5.90)
RDW: 14.7 % — ABNORMAL HIGH (ref 11.5–14.5)
WBC: 4.1 x10 3/mm (ref 3.8–10.6)

## 2014-06-08 ENCOUNTER — Ambulatory Visit: Payer: Self-pay | Admitting: Hematology and Oncology

## 2014-06-21 LAB — CBC CANCER CENTER
BASOS PCT: 2.4 %
Basophil #: 0.1 x10 3/mm (ref 0.0–0.1)
EOS PCT: 4.6 %
Eosinophil #: 0.1 x10 3/mm (ref 0.0–0.7)
HCT: 38.3 % — AB (ref 40.0–52.0)
HGB: 13.4 g/dL (ref 13.0–18.0)
LYMPHS ABS: 1 x10 3/mm (ref 1.0–3.6)
LYMPHS PCT: 31.3 %
MCH: 37.9 pg — ABNORMAL HIGH (ref 26.0–34.0)
MCHC: 34.9 g/dL (ref 32.0–36.0)
MCV: 109 fL — AB (ref 80–100)
MONOS PCT: 11.8 %
Monocyte #: 0.4 x10 3/mm (ref 0.2–1.0)
Neutrophil #: 1.6 x10 3/mm (ref 1.4–6.5)
Neutrophil %: 49.9 %
Platelet: 205 x10 3/mm (ref 150–440)
RBC: 3.52 10*6/uL — ABNORMAL LOW (ref 4.40–5.90)
RDW: 14.4 % (ref 11.5–14.5)
WBC: 3.2 x10 3/mm — AB (ref 3.8–10.6)

## 2014-06-21 LAB — BASIC METABOLIC PANEL
Anion Gap: 13 (ref 7–16)
BUN: 15 mg/dL (ref 7–18)
CHLORIDE: 98 mmol/L (ref 98–107)
Calcium, Total: 8.3 mg/dL — ABNORMAL LOW (ref 8.5–10.1)
Co2: 26 mmol/L (ref 21–32)
Creatinine: 1.06 mg/dL (ref 0.60–1.30)
EGFR (African American): >60
EGFR (Non-African Amer.): >60
GLUCOSE: 159 mg/dL — AB (ref 65–99)
OSMOLALITY: 278 (ref 275–301)
Potassium: 4.1 mmol/L (ref 3.5–5.1)
SODIUM: 137 mmol/L (ref 136–145)

## 2014-07-09 ENCOUNTER — Ambulatory Visit: Payer: Self-pay | Admitting: Hematology and Oncology

## 2014-07-26 LAB — CBC CANCER CENTER
Basophil #: 0.1 x10 3/mm (ref 0.0–0.1)
Basophil %: 3.7 %
EOS ABS: 0.4 x10 3/mm (ref 0.0–0.7)
Eosinophil %: 11.1 %
HCT: 38.5 % — ABNORMAL LOW (ref 40.0–52.0)
HGB: 13.2 g/dL (ref 13.0–18.0)
LYMPHS ABS: 1 x10 3/mm (ref 1.0–3.6)
Lymphocyte %: 25.3 %
MCH: 37.5 pg — ABNORMAL HIGH (ref 26.0–34.0)
MCHC: 34.4 g/dL (ref 32.0–36.0)
MCV: 109 fL — AB (ref 80–100)
Monocyte #: 0.6 x10 3/mm (ref 0.2–1.0)
Monocyte %: 14.8 %
Neutrophil #: 1.7 x10 3/mm (ref 1.4–6.5)
Neutrophil %: 45.1 %
Platelet: 209 x10 3/mm (ref 150–440)
RBC: 3.53 10*6/uL — ABNORMAL LOW (ref 4.40–5.90)
RDW: 12.8 % (ref 11.5–14.5)
WBC: 3.9 x10 3/mm (ref 3.8–10.6)

## 2014-07-26 LAB — COMPREHENSIVE METABOLIC PANEL
Albumin: 3.6 g/dL (ref 3.4–5.0)
Alkaline Phosphatase: 54 U/L
Anion Gap: 12 (ref 7–16)
BILIRUBIN TOTAL: 0.5 mg/dL (ref 0.2–1.0)
BUN: 16 mg/dL (ref 7–18)
CHLORIDE: 101 mmol/L (ref 98–107)
CO2: 22 mmol/L (ref 21–32)
Calcium, Total: 9 mg/dL (ref 8.5–10.1)
Creatinine: 1.12 mg/dL (ref 0.60–1.30)
EGFR (African American): >60
Glucose: 141 mg/dL — ABNORMAL HIGH (ref 65–99)
Osmolality: 274 (ref 275–301)
POTASSIUM: 4.9 mmol/L (ref 3.5–5.1)
SGOT(AST): 40 U/L — ABNORMAL HIGH (ref 15–37)
SGPT (ALT): 39 U/L
Sodium: 135 mmol/L — ABNORMAL LOW (ref 136–145)
Total Protein: 8.3 g/dL — ABNORMAL HIGH (ref 6.4–8.2)

## 2014-07-31 LAB — PROT IMMUNOELECTROPHORES(ARMC)

## 2014-08-08 ENCOUNTER — Ambulatory Visit: Payer: Self-pay | Admitting: Hematology and Oncology

## 2014-08-23 LAB — BASIC METABOLIC PANEL
ANION GAP: 4 — AB (ref 7–16)
BUN: 17 mg/dL (ref 7–18)
CALCIUM: 8.3 mg/dL — AB (ref 8.5–10.1)
CREATININE: 1 mg/dL (ref 0.60–1.30)
Chloride: 106 mmol/L (ref 98–107)
Co2: 26 mmol/L (ref 21–32)
EGFR (Non-African Amer.): >60
Glucose: 118 mg/dL — ABNORMAL HIGH (ref 65–99)
Osmolality: 275 (ref 275–301)
Potassium: 4.5 mmol/L (ref 3.5–5.1)
Sodium: 136 mmol/L (ref 136–145)

## 2014-08-23 LAB — CBC CANCER CENTER
BASOS PCT: 3.2 %
Basophil #: 0.1 x10 3/mm (ref 0.0–0.1)
EOS ABS: 0.2 x10 3/mm (ref 0.0–0.7)
Eosinophil %: 6.7 %
HCT: 39.5 % — AB (ref 40.0–52.0)
HGB: 13.5 g/dL (ref 13.0–18.0)
LYMPHS PCT: 33.2 %
Lymphocyte #: 1.2 x10 3/mm (ref 1.0–3.6)
MCH: 36.3 pg — ABNORMAL HIGH (ref 26.0–34.0)
MCHC: 34.2 g/dL (ref 32.0–36.0)
MCV: 106 fL — AB (ref 80–100)
MONOS PCT: 16.5 %
Monocyte #: 0.6 x10 3/mm (ref 0.2–1.0)
NEUTROS ABS: 1.5 x10 3/mm (ref 1.4–6.5)
Neutrophil %: 40.4 %
Platelet: 212 x10 3/mm (ref 150–440)
RBC: 3.72 10*6/uL — ABNORMAL LOW (ref 4.40–5.90)
RDW: 12.5 % (ref 11.5–14.5)
WBC: 3.7 x10 3/mm — AB (ref 3.8–10.6)

## 2014-08-27 DIAGNOSIS — M5116 Intervertebral disc disorders with radiculopathy, lumbar region: Secondary | ICD-10-CM | POA: Insufficient documentation

## 2014-08-27 DIAGNOSIS — M169 Osteoarthritis of hip, unspecified: Secondary | ICD-10-CM | POA: Insufficient documentation

## 2014-08-27 DIAGNOSIS — M4800 Spinal stenosis, site unspecified: Secondary | ICD-10-CM | POA: Insufficient documentation

## 2014-08-27 DIAGNOSIS — M48061 Spinal stenosis, lumbar region without neurogenic claudication: Secondary | ICD-10-CM | POA: Insufficient documentation

## 2014-08-27 DIAGNOSIS — M47816 Spondylosis without myelopathy or radiculopathy, lumbar region: Secondary | ICD-10-CM | POA: Insufficient documentation

## 2014-09-08 ENCOUNTER — Ambulatory Visit: Payer: Self-pay | Admitting: Hematology and Oncology

## 2014-09-17 DIAGNOSIS — I272 Pulmonary hypertension, unspecified: Secondary | ICD-10-CM | POA: Insufficient documentation

## 2014-09-17 DIAGNOSIS — I340 Nonrheumatic mitral (valve) insufficiency: Secondary | ICD-10-CM | POA: Insufficient documentation

## 2014-09-17 DIAGNOSIS — G4733 Obstructive sleep apnea (adult) (pediatric): Secondary | ICD-10-CM | POA: Insufficient documentation

## 2014-09-20 LAB — BASIC METABOLIC PANEL
Anion Gap: 14 (ref 7–16)
BUN: 20 mg/dL — ABNORMAL HIGH (ref 7–18)
CALCIUM: 9.4 mg/dL (ref 8.5–10.1)
CO2: 22 mmol/L (ref 21–32)
CREATININE: 1.26 mg/dL (ref 0.60–1.30)
Chloride: 101 mmol/L (ref 98–107)
EGFR (African American): >60
Glucose: 111 mg/dL — ABNORMAL HIGH (ref 65–99)
OSMOLALITY: 277 (ref 275–301)
Potassium: 4.8 mmol/L (ref 3.5–5.1)
SODIUM: 137 mmol/L (ref 136–145)

## 2014-09-20 LAB — CBC CANCER CENTER
Basophil #: 0.1 x10 3/mm (ref 0.0–0.1)
Basophil %: 1.4 %
Eosinophil #: 0.2 x10 3/mm (ref 0.0–0.7)
Eosinophil %: 4.8 %
HCT: 39.4 % — AB (ref 40.0–52.0)
HGB: 13.6 g/dL (ref 13.0–18.0)
LYMPHS ABS: 1.2 x10 3/mm (ref 1.0–3.6)
Lymphocyte %: 29.1 %
MCH: 35.3 pg — ABNORMAL HIGH (ref 26.0–34.0)
MCHC: 34.4 g/dL (ref 32.0–36.0)
MCV: 103 fL — ABNORMAL HIGH (ref 80–100)
MONO ABS: 0.7 x10 3/mm (ref 0.2–1.0)
Monocyte %: 16.4 %
Neutrophil #: 2.1 x10 3/mm (ref 1.4–6.5)
Neutrophil %: 48.3 %
Platelet: 193 x10 3/mm (ref 150–440)
RBC: 3.84 10*6/uL — AB (ref 4.40–5.90)
RDW: 13.2 % (ref 11.5–14.5)
WBC: 4.3 x10 3/mm (ref 3.8–10.6)

## 2014-09-23 LAB — PROT IMMUNOELECTROPHORES(ARMC)

## 2014-09-29 ENCOUNTER — Ambulatory Visit: Payer: Self-pay | Admitting: Internal Medicine

## 2014-10-08 ENCOUNTER — Ambulatory Visit: Payer: Self-pay | Admitting: Hematology and Oncology

## 2014-10-16 LAB — CBC CANCER CENTER
Basophil #: 0.1 x10 3/mm (ref 0.0–0.1)
Basophil %: 3.6 %
Eosinophil #: 0.3 x10 3/mm (ref 0.0–0.7)
Eosinophil %: 8.9 %
HCT: 38.5 % — ABNORMAL LOW (ref 40.0–52.0)
HGB: 13.1 g/dL (ref 13.0–18.0)
LYMPHS PCT: 20.7 %
Lymphocyte #: 0.7 x10 3/mm — ABNORMAL LOW (ref 1.0–3.6)
MCH: 35.1 pg — AB (ref 26.0–34.0)
MCHC: 33.9 g/dL (ref 32.0–36.0)
MCV: 104 fL — ABNORMAL HIGH (ref 80–100)
MONOS PCT: 11.6 %
Monocyte #: 0.4 x10 3/mm (ref 0.2–1.0)
Neutrophil #: 1.8 x10 3/mm (ref 1.4–6.5)
Neutrophil %: 55.2 %
PLATELETS: 177 x10 3/mm (ref 150–440)
RBC: 3.72 10*6/uL — AB (ref 4.40–5.90)
RDW: 13.5 % (ref 11.5–14.5)
WBC: 3.3 x10 3/mm — AB (ref 3.8–10.6)

## 2014-10-16 LAB — COMPREHENSIVE METABOLIC PANEL
ALBUMIN: 3.5 g/dL (ref 3.4–5.0)
Alkaline Phosphatase: 58 U/L
Anion Gap: 9 (ref 7–16)
BUN: 16 mg/dL (ref 7–18)
Bilirubin,Total: 0.5 mg/dL (ref 0.2–1.0)
CO2: 26 mmol/L (ref 21–32)
Calcium, Total: 8.8 mg/dL (ref 8.5–10.1)
Chloride: 102 mmol/L (ref 98–107)
Creatinine: 1.07 mg/dL (ref 0.60–1.30)
EGFR (African American): >60
EGFR (Non-African Amer.): >60
Glucose: 169 mg/dL — ABNORMAL HIGH (ref 65–99)
OSMOLALITY: 279 (ref 275–301)
Potassium: 4.9 mmol/L (ref 3.5–5.1)
SGOT(AST): 17 U/L (ref 15–37)
SGPT (ALT): 26 U/L
Sodium: 137 mmol/L (ref 136–145)
Total Protein: 7.8 g/dL (ref 6.4–8.2)

## 2014-10-16 LAB — MAGNESIUM: MAGNESIUM: 1.8 mg/dL

## 2014-10-17 LAB — KAPPA/LAMBDA FREE LIGHT CHAINS (ARMC)

## 2014-11-08 ENCOUNTER — Ambulatory Visit: Payer: Self-pay | Admitting: Hematology and Oncology

## 2014-11-18 LAB — CBC CANCER CENTER
Basophil #: 0.1 x10 3/mm (ref 0.0–0.1)
Basophil %: 2.7 %
EOS PCT: 8.5 %
Eosinophil #: 0.3 x10 3/mm (ref 0.0–0.7)
HCT: 37 % — ABNORMAL LOW (ref 40.0–52.0)
HGB: 12.7 g/dL — ABNORMAL LOW (ref 13.0–18.0)
Lymphocyte #: 1 x10 3/mm (ref 1.0–3.6)
Lymphocyte %: 34.8 %
MCH: 35.8 pg — AB (ref 26.0–34.0)
MCHC: 34.4 g/dL (ref 32.0–36.0)
MCV: 104 fL — ABNORMAL HIGH (ref 80–100)
Monocyte #: 0.4 x10 3/mm (ref 0.2–1.0)
Monocyte %: 14.5 %
Neutrophil #: 1.2 x10 3/mm — ABNORMAL LOW (ref 1.4–6.5)
Neutrophil %: 39.5 %
Platelet: 198 x10 3/mm (ref 150–440)
RBC: 3.56 10*6/uL — AB (ref 4.40–5.90)
RDW: 15.1 % — ABNORMAL HIGH (ref 11.5–14.5)
WBC: 3 x10 3/mm — ABNORMAL LOW (ref 3.8–10.6)

## 2014-11-18 LAB — CREATININE, SERUM
Creatinine: 0.82 mg/dL (ref 0.60–1.30)
EGFR (African American): >60
EGFR (Non-African Amer.): >60

## 2014-11-18 LAB — ALBUMIN: Albumin: 3.5 g/dL (ref 3.4–5.0)

## 2014-11-18 LAB — CALCIUM: Calcium, Total: 7.9 mg/dL — ABNORMAL LOW (ref 8.5–10.1)

## 2014-12-09 ENCOUNTER — Ambulatory Visit: Payer: Self-pay | Admitting: Hematology and Oncology

## 2015-01-07 ENCOUNTER — Ambulatory Visit
Admit: 2015-01-07 | Disposition: A | Discharge status: Home / Self Care | Payer: Self-pay | Attending: Hematology and Oncology | Admitting: Hematology and Oncology

## 2015-02-07 ENCOUNTER — Ambulatory Visit
Admit: 2015-02-07 | Disposition: A | Discharge status: Home / Self Care | Payer: Self-pay | Attending: Hematology and Oncology | Admitting: Hematology and Oncology

## 2015-02-17 LAB — CBC CANCER CENTER
Basophil #: 0.1 x10 3/mm (ref 0.0–0.1)
Basophil %: 2.6 %
Eosinophil #: 0.5 x10 3/mm (ref 0.0–0.7)
Eosinophil %: 12.6 %
HCT: 37.7 % — ABNORMAL LOW (ref 40.0–52.0)
HGB: 12.9 g/dL — ABNORMAL LOW (ref 13.0–18.0)
Lymphocyte #: 0.9 x10 3/mm — ABNORMAL LOW (ref 1.0–3.6)
Lymphocyte %: 25.4 %
MCH: 35.5 pg — ABNORMAL HIGH (ref 26.0–34.0)
MCHC: 34.2 g/dL (ref 32.0–36.0)
MCV: 104 fL — ABNORMAL HIGH (ref 80–100)
Monocyte #: 0.4 x10 3/mm (ref 0.2–1.0)
Monocyte %: 10.1 %
Neutrophil #: 1.8 x10 3/mm (ref 1.4–6.5)
Neutrophil %: 49.3 %
Platelet: 284 x10 3/mm (ref 150–440)
RBC: 3.63 10*6/uL — ABNORMAL LOW (ref 4.40–5.90)
RDW: 13.5 % (ref 11.5–14.5)
WBC: 3.6 x10 3/mm — ABNORMAL LOW (ref 3.8–10.6)

## 2015-02-17 LAB — BASIC METABOLIC PANEL
Anion Gap: 7 (ref 7–16)
BUN: 14 mg/dL
Calcium, Total: 8.6 mg/dL — ABNORMAL LOW
Chloride: 101 mmol/L
Co2: 26 mmol/L
Creatinine: 0.77 mg/dL
EGFR (African American): >60
EGFR (Non-African Amer.): >60
Glucose: 120 mg/dL — ABNORMAL HIGH
Potassium: 4.3 mmol/L
Sodium: 134 mmol/L — ABNORMAL LOW

## 2015-02-17 LAB — ALBUMIN: Albumin: 3.8 g/dL

## 2015-02-26 LAB — CREATININE, SERUM: CREATINE, SERUM: 0.77

## 2015-02-28 NOTE — H&P (Signed)
PATIENT NAME:  Luis [name], DEVINCENT MR#:  [id number] DATE OF BIRTH:  [birth year]  DATE OF ADMISSION:  08/07/2013  REFERRING PHYSICIAN: Dr. Marta Antu.   PRIMARY CARE PHYSICIAN: Dr. Geralyn Corwin.   PRIMARY ONCOLOGIST: Dr. Kennieth Francois.  CHIEF COMPLAINT: Abdominal pain.   HISTORY OF PRESENT ILLNESS: This is a 69 year old male with significant past medical history of coronary artery disease, diabetes mellitus, hyperlipidemia, multiple myeloma currently in admission, presents with complaints of abdominal pain. The patient reports he is having abdominal pain. Started this afternoon. Reported progressed during he day. Became more significant which prompted him to come to the ED. Also, the patient has been complaining of nausea, some dry heaves. Reports some episodes of vomiting but was a very minimal amount. The patient denies any fever, any chills, any coffee-ground emesis, any diarrhea, any constipation or any melena. In the ED, the patient had CT abdomen and pelvis which did show evidence of acute diverticulitis. As well, did show evidence of left abdominal wall hernia containing small bowel and as well containing part on inflamed colon, but there was no evidence of incarceration or obstruction. Hospitalist service was requested to admit the patient for treatment for his acute diverticulitis. ED physician discussed the case with surgery on call who reported there is no indication for any emergent surgery intervention at this point. As well, she discussed this with radiology on call who reports that there is no evidence of obstruction or incarceration. The patient did not have any fever in the ED. Has no leukocytosis. Reports abdominal pain has much improved after receiving pain meds in the ED.   PAST MEDICAL HISTORY:  1. Coronary artery disease.  2. Gastroesophageal reflux disease.  3. Hyperlipidemia.  4. Diabetes mellitus.  5. Hypertension.  6. Osteoarthritis.  7. Obstructive sleep apnea, on CPAP.  8.  Multiple myeloma, in remission.   PAST SURGICAL HISTORY:  1. Cardiac bypass in 2001.  2. Right shoulder surgery.  3. Left inguinal hernia.  4. Right hip replacement.  5. Right carpal tunnel surgery.  6. Right cataract surgery in 2009, left in 2011.  7. Colonoscopy in January 2014 showing rectal polyps.   ALLERGIES:  1. PRAVACHOL.  2. STATIN.  3. ZOCOR.   HOME MEDICATIONS:  1. Prednisone 5 mg oral daily. Being tapered currently. Today is his first dose on 5 mg. He used to be on 15. This was by his rheumatologist for inflammatory arthritis.  2. Aspirin 81 mg oral daily.  3. Tramadol 50 mg every 6 hours as needed.  4. Lisinopril 10 mg oral daily.  5. Glyburide 5 mg oral daily.  6. Metformin 500 mg oral 2 times a day.  7. Lovastatin 20 mg oral daily.  8. Revlimid 10 mg oral daily.  9. Omeprazole 20 mg oral daily.  10. Vitamin B12 1000 mcg injection every 6 weeks.    FAMILY HISTORY: Significant for coronary artery disease in his father.   SOCIAL HISTORY: No history of alcohol use. The patient quit smoking in 2003. Lives at home with his wife.   REVIEW OF SYSTEMS:  CONSTITUTIONAL: The patient denies any fever, chills. Complains of fatigue, weakness. Denies weight gain, weight loss.  EYES: Denies blurry vision, double vision, inflammation, glaucoma.  ENT: Denies tinnitus, ear pain, hearing loss, epistaxis, discharge.  RESPIRATORY: Denies cough, wheezing, hemoptysis, dyspnea, painful respiratory or COPD.   CARDIOVASCULAR: Denies chest pain, orthopnea, edema, arrhythmia, palpitation or syncope.  GASTROINTESTINAL: Complains of nausea. Had dry heaves but no significant vomiting. Complains  of abdominal pain. Denies hematemesis, melena, rectal bleed, diarrhea or constipation.  GENITOURINARY: Denies dysuria, hematuria, renal colic.  ENDOCRINE: Denies polyuria, polydipsia, heat or cold intolerance.  HEMATOLOGY: Denies anemia, easy bruising, bleeding, diathesis. Has history of multiple  myeloma.  INTEGUMENTARY: Denies acne, rash or skin lesions.  MUSCULOSKELETAL: Has history of arthritis but denies any cramps, swelling or gout.  NEUROLOGIC: Denies CVA, TIA, vertigo, tremors, migraine.  PSYCHIATRIC: Denies anxiety, insomnia, schizophrenia, substance abuse or alcohol abuse.   PHYSICAL EXAMINATION:  VITAL SIGNS: Temperature 98.2, pulse 72, respiratory rate 18, blood pressure 170/84, saturating 95% on room air.  GENERAL: Elderly male, looks comfortable, in no apparent distress.  HEENT: Head is atraumatic, normocephalic. Pupils equal, reactive to light. Pink conjunctivae. Anicteric sclerae. Moist oral mucosa.  NECK: Supple. No thyromegaly. No JVD.  CHEST: Good air entry bilaterally. No wheezing, rales or rhonchi.  CARDIOVASCULAR: S1, S2 heard. No rubs, murmurs or gallops.  ABDOMEN: Has very minimal tenderness in the right abdominal area, but no rebound, no guarding. Bowel sounds present in all 4 quadrents.  No tenderness on the left abdominal wall hernia.  EXTREMITIES: No edema. No clubbing. No cyanosis. Dorsalis pedis pulse +2 bilaterally.  SKIN: Normal skin turgor. Warm and dry. No rash.  PSYCHIATRIC: Awake, alert x 3. Intact judgment and insight.  NEUROLOGIC: Cranial nerves grossly intact. Motor 5 out of 5. No focal deficits.  LYMPHATICS: No cervical or supraclavicular lymphadenopathy.   PERTINENT LABORATORIES: Glucose 153, BUN 15, creatinine 1.06, sodium 135, potassium 3.3, chloride 104, CO2 23. ALT 25, AST 28, alk phos 6.3. Troponin less than 0.02. White blood cells 4.8, hemoglobin 12.7, hematocrit 35.8, platelets 210. Urinalysis negative for leukocyte esterase and nitrites.   CT of abdomen and pelvis showing evidence of left abdominal wall hernia with small bowel. No evidence of obstruction and part of inflamed colon in the hernia as well.   ASSESSMENT AND PLAN:  1. Acute diverticulitis: The patient will be started on intravenous Cipro and intravenous Flagyl. He will be  kept on clear liquid diet and advance as tolerated. He will be kept on p.r.n. nausea and pain medicine. Will consult surgery as well.  2. Abdominal hernia which contains inflamed colon: Has no evidence of incarceration or obstruction on the CT. I did discuss with surgeon on call. There is no need for any emergent intervention. Will consult surgery and they will follow the patient in a.m.  3. Coronary artery disease: The patient denies any chest pain or any shortness of breath. Will resume him back on aspirin when he is more stable.  4. Gastroesophageal reflux disease: Continue with proton pump inhibitor.   5. History of multiple myeloma: Currently in admission. Continue with Revlimid. The patient is following with oncology service as an outpatient.  6. Hyperlipidemia: Continue with Mevacor.  7. Diabetes mellitus: Will hold oral agents,  and metformin, until we start the patient on insulin sliding scale.  8. Obstructive sleep apnea: Continue with CPAP.  9. Deep vein thrombosis prophylaxis: Subcutaneous heparin.  10. Gastrointestinal prophylaxis: The patient is on proton pump inhibitor.   CODE STATUS: The patient is FULL CODE, but does not wish to remain on life support if he has poor prognosis. As well, he reports he does not have a Living Will, but his healthcare power of attorney is his eldest son.   TOTAL TIME SPENT ON ADMISSION AND PATIENT CARE: 55 minutes.   ____________________________ Albertine Patricia, MD dse:gb  D: 08/07/2013 03:05:23 ET T: 08/07/2013 03:31:30 ET  JOB#: [id number]  cc: Albertine Patricia, MD, <Dictator> Latoia Eyster Graciela Husbands MD ELECTRONICALLY SIGNED 08/18/2013 3:30

## 2015-02-28 NOTE — Consult Note (Signed)
PATIENT NAME:  Luis [name], Luis [name] MR#:  [id number] DATE OF BIRTH:  15-Oct-1946  DATE OF CONSULTATION:  08/07/2013  CONSULTING PHYSICIAN:  Harrell Gave A. Allante Beane, MD  ATTENDING PHYSICIAN: Dr. Marlyce Huge.  REASON FOR CONSULT: Abdominal pain.   HISTORY OF PRESENT ILLNESS: Luis [name] is a pleasant 69 year old male with a history of coronary artery disease, diabetes, hyperlipidemia and multiple myeloma, currently in remission for which he takes chemotherapy intermittently, for which he began his first dose of oral chemotherapy yesterday, who presents with abdominal pain. He says that his pain started in the afternoon yesterday. It has gotten worse. He also had some nausea and dry heaves. He says that it is diffuse. No obvious focal area at that time, but it has since resolved for the most part. Otherwise feeling fine. Does have alternating diarrhea and constipation at baseline. No current nausea, vomiting, diarrhea, or constipation. No current abdominal pain, no fevers, chills, night sweats. Has a history of abdominal pain in the past after chemotherapy, but is unable to be specific as to how long after taking chemotherapy.   PAST MEDICAL HISTORY:  1.  Coronary artery disease.  2.  Gastroesophageal reflux disease.  3.  Hyperlipidemia.  4.  Diabetes.  5.  Hypertension.  6.  Osteoarthritis.  7.  Multiple myeloma, in remission.  8.  Obstructive sleep apnea.  9.  History of cardiac bypass.  10.  History of right shoulder surgery.  11.  History of left inguinal hernia repair when he was 69 year old.  12.  History of right hip replacement.  13.  History of right carpal tunnel.   SURGERIES:  1.  History of right and left cataract surgeries.  2.  History of colonoscopy in 2014, showing five rectal polyps.   ALLERGIES: PRAVACHOL, STATIN, ZOCOR.   HOME MEDICATIONS:  1.  Prednisone.  2.  Aspirin.  3.  Tramadol.  4.  Lisinopril. 5. Glyburide.  6.  Metformin.  7.  Lovastatin.  8.   Revlimid. 9.  Omeprazole.  10.  Vitamin B12.   FAMILY HISTORY: Coronary artery disease in his father.   SOCIAL HISTORY: Denies alcohol or drug use. No current tobacco. Is here with his wife.   REVIEW OF SYSTEMS: 12-point review of systems  obtained. Pertinent positives and negatives as above.   PHYSICAL EXAMINATION: VITAL SIGNS: Temperature 99.1, pulse 86, blood pressure 171/77, respirations 20, 93% on room air.  GENERAL: No acute distress. Alert and oriented x 3.  HEAD: Normocephalic, atraumatic.  EYES: No scleral icterus. No conjunctivitis.  CHEST: Lungs clear to auscultation.  HEAD: No obvious facial trauma. Normal external nose. Normal external ears.  CHEST: Lungs clear to auscultation, without rales.  ABDOMEN: Soft, nontender, nondistended. Does have a weakening in the lateral abdominal wall. Groins nontender.  EXTREMITIES. Strength 5/5 in all 4 extremities. Moves all extremities well.  NEUROLOGIC: Cranial nerves II-XII grossly intact.   LABS: Currently white cell count of 4.8, hemoglobin 12.7, hematocrit 35.8, platelets are 210.   BMP significant for potassium of 3.3.   CT shows a colon with diverticulosis. No history of diverticulitis. Does have a weakening  hernia/atrophy of the lateral abdominal wall. No obvious signs of incarceration of bowel. Does have 2 fat-filled inguinal hernias.   ASSESSMENT AND PLAN: Luis [name] is a pleasant 69 year old male who presents with abdominal pain which has since resolved.   I do not see any indication of diverticulitis, but he is just began his chemotherapy and I am unsure whether or  not that could be there could be masking the exam and CT findings. As his white cell count was depressed at admission I feel that his abdominal wall hernia may in fact be  an atrophy of abdominal wall muscles in the context of rib fractures and metastases, which could be involving nerves which innervate this area. I can see no surgical intervention at this time.  Will consider discussing.   Will defer antibiotic. Continue the patient on antibiotics to primary team as I am uncertain whether or not chemotherapy could be masking symptoms, and the safe route would be to continue for a full course, but as this was uncomplicated would not recommend surgery now or in the near future. Will continue to follow.      ____________________________ Glena Norfolk Bettey Muraoka, MD cal:dm D: 08/07/2013 11:55:08 ET T: 08/07/2013 12:10:52 ET JOB#: [id number]  cc: Harrell Gave A. Clarity Ciszek, MD, <Dictator> Floyde Parkins MD ELECTRONICALLY SIGNED 08/20/2013 21:08

## 2015-02-28 NOTE — Consult Note (Signed)
Statins: Muscles aches  Zocor: Other  Pravachol: Other  Electronic Signatures: Georges Mouse (MD)  (Signed 27-Oct-14 13:25)  AuthoredFabienne Bruns, HOME MEDICATIONS   Last Updated: 27-Oct-14 13:25 by Georges Mouse (MD)

## 2015-02-28 NOTE — Discharge Summary (Signed)
PATIENT NAME:  Luis [name], HOOGLAND MR#:  [id number] DATE OF BIRTH:  1946-05-28  DATE OF ADMISSION:  08/07/2013 DATE OF DISCHARGE:  08/09/2013  DISCHARGE DIAGNOSES: 1.  Abdominal pain secondary to diverticulitis.  2.  Multiple myeloma.  3.  History of coronary artery disease.  4.  Gastroesophageal reflux disease.  5.  Hyperlipidemia.  6.  Type 2 diabetes.  7.  Obstructive sleep apnea.   CHIEF COMPLAINT: Abdominal pain.   HISTORY OF PRESENT ILLNESS: Dina Mobley is a 69 year old male with a history of coronary artery disease, type 2 diabetes, hyperlipidemia and multiple myeloma who presented to the ER complaining of abdominal pain. The patient states the pain gradually got worse and also had been complaining of nausea associated with dry heaves. Denies any fevers or chills. No coffee-ground emesis. No diarrhea.   PAST MEDICAL HISTORY: Significant for CAD, GERD, hyperlipidemia, diabetes mellitus, hypertension, osteoarthritis, obstructive sleep apnea and multiple myeloma in remission.   PAST SURGICAL HISTORY: Significant for cardiac bypass surgery in 2001, right shoulder surgery, left inguinal hernia repair, right hip replacement, right carpal tunnel surgery and right cataract surgery.   PHYSICAL EXAMINATION: VITAL SIGNS: Temperature 98.2, pulse 72, respirations 18, blood pressure 170/84.  GENERAL: He was not in distress.  HEENT: :[place], AT NECK: Supple.  CHEST: Good air entry.  HEART: S1, S2.  ABDOMEN: Soft. Evidence of tenderness in both lower quadrants, left worse than right.  EXTREMITIES: No edema.  NEUROLOGIC: Nonfocal.   HOSPITAL COURSE: The patient was admitted to [facility] and started on IV antibiotics with Cipro and Flagyl. He was also seen in consultation by Dr. Rexene Edison who did not think surgery was warranted. The patient did have an episode of right-sided chest pain and had an x-ray of his chest which showed an area of atelectasis versus infiltrate in the region of the right lingula and left  lung base. CT scan of the abdomen showed atrophic liver with hepatic changes and bilateral fat-containing inguinal hernias, left lower quadrant ventral wall hernia and evidence of diverticulosis. The patient responded well with antibiotic therapy and his abdominal pain gradually reduced. His diet was advanced. He did have low potassium of 3.3, which was repleted. White cell count remained stable at 6.9. The patient was discharged in stable condition on the following medications.   DISCHARGE MEDICATIONS: 1.  Cipro 500 mg q. 12 for 10 days. 2.  Metronidazole 5 mg every 8 hours for 10 days. 3.  Revlimid 10 mg once a day. 4.  Prednisone 5 mg a day. 5.  Lisinopril 10 mg once a day. 6.  Vitamin B12 one dose injectable every 6 weeks. 7.  Lovastatin 20 mg once a day. 8.  Aspirin 81 mg a day. 9.  Bee pollen 1 tab once a day.   DISCHARGE INSTRUCTIONS:  The patient was advised a low carb diet and follow me, Dr. Ginette Pitman, in 1 to 2 weeks. He was also advised to keep his follow-up appointment with Dr. Kallie Edward, his oncologist, as scheduled. Advised to call back with any questions or concerns.   TOTAL TIME SPENT: In discharging this patient, 35 minutes.  ____________________________ Tracie Harrier, MD vh:sb D: 08/09/2013 13:14:15 ET T: 08/09/2013 13:40:09 ET JOB#: [id number]  cc: Tracie Harrier, MD, <Dictator> Tracie Harrier MD ELECTRONICALLY SIGNED 08/13/2013 18:27

## 2015-03-27 ENCOUNTER — Other Ambulatory Visit: Payer: Self-pay

## 2015-03-27 DIAGNOSIS — C9000 Multiple myeloma not having achieved remission: Secondary | ICD-10-CM | POA: Insufficient documentation

## 2015-03-27 HISTORY — DX: Multiple myeloma not having achieved remission: C90.00

## 2015-03-31 ENCOUNTER — Inpatient Hospital Stay: Payer: BLUE CROSS/BLUE SHIELD

## 2015-03-31 ENCOUNTER — Inpatient Hospital Stay: Payer: BLUE CROSS/BLUE SHIELD | Attending: Hematology and Oncology | Admitting: Hematology and Oncology

## 2015-03-31 VITALS — BP 160/82 | HR 102 | Temp 98.7°F | Ht 67.0 in | Wt 194.9 lb

## 2015-03-31 DIAGNOSIS — E119 Type 2 diabetes mellitus without complications: Secondary | ICD-10-CM | POA: Diagnosis not present

## 2015-03-31 DIAGNOSIS — I10 Essential (primary) hypertension: Secondary | ICD-10-CM

## 2015-03-31 DIAGNOSIS — K219 Gastro-esophageal reflux disease without esophagitis: Secondary | ICD-10-CM | POA: Insufficient documentation

## 2015-03-31 DIAGNOSIS — E538 Deficiency of other specified B group vitamins: Secondary | ICD-10-CM

## 2015-03-31 DIAGNOSIS — C9000 Multiple myeloma not having achieved remission: Secondary | ICD-10-CM | POA: Diagnosis present

## 2015-03-31 DIAGNOSIS — R5383 Other fatigue: Secondary | ICD-10-CM | POA: Diagnosis not present

## 2015-03-31 DIAGNOSIS — R197 Diarrhea, unspecified: Secondary | ICD-10-CM | POA: Insufficient documentation

## 2015-03-31 DIAGNOSIS — I208 Other forms of angina pectoris: Secondary | ICD-10-CM

## 2015-03-31 DIAGNOSIS — I509 Heart failure, unspecified: Secondary | ICD-10-CM | POA: Diagnosis not present

## 2015-03-31 DIAGNOSIS — Z79899 Other long term (current) drug therapy: Secondary | ICD-10-CM | POA: Insufficient documentation

## 2015-03-31 DIAGNOSIS — M129 Arthropathy, unspecified: Secondary | ICD-10-CM

## 2015-03-31 DIAGNOSIS — E785 Hyperlipidemia, unspecified: Secondary | ICD-10-CM | POA: Diagnosis not present

## 2015-03-31 DIAGNOSIS — Z7982 Long term (current) use of aspirin: Secondary | ICD-10-CM | POA: Diagnosis not present

## 2015-03-31 LAB — COMPREHENSIVE METABOLIC PANEL
ALT: 14 U/L — ABNORMAL LOW (ref 17–63)
AST: 25 U/L (ref 15–41)
Albumin: 4 g/dL (ref 3.5–5.0)
Alkaline Phosphatase: 42 U/L (ref 38–126)
Anion gap: 7 (ref 5–15)
BUN: 11 mg/dL (ref 6–20)
CO2: 24 mmol/L (ref 22–32)
Calcium: 8.6 mg/dL — ABNORMAL LOW (ref 8.9–10.3)
Chloride: 104 mmol/L (ref 101–111)
Creatinine, Ser: 0.81 mg/dL (ref 0.61–1.24)
GFR calc Af Amer: >60 mL/min (ref >60)
GFR calc non Af Amer: >60 mL/min (ref >60)
Glucose, Bld: 118 mg/dL — ABNORMAL HIGH (ref 65–99)
Potassium: 3.8 mmol/L (ref 3.5–5.1)
Sodium: 135 mmol/L (ref 135–145)
Total Bilirubin: 0.7 mg/dL (ref 0.3–1.2)
Total Protein: 8.2 g/dL — ABNORMAL HIGH (ref 6.5–8.1)

## 2015-03-31 LAB — CBC WITH DIFFERENTIAL/PLATELET
Basophils Absolute: 0.1 10*3/uL (ref 0–0.1)
Basophils Relative: 3 %
Eosinophils Absolute: 0.3 10*3/uL (ref 0–0.7)
Eosinophils Relative: 10 %
HCT: 40.5 % (ref 40.0–52.0)
Hemoglobin: 13.9 g/dL (ref 13.0–18.0)
Lymphocytes Relative: 29 %
Lymphs Abs: 1 10*3/uL (ref 1.0–3.6)
MCH: 36.2 pg — ABNORMAL HIGH (ref 26.0–34.0)
MCHC: 34.4 g/dL (ref 32.0–36.0)
MCV: 105.2 fL — ABNORMAL HIGH (ref 80.0–100.0)
Monocytes Absolute: 0.5 10*3/uL (ref 0.2–1.0)
Monocytes Relative: 15 %
Neutro Abs: 1.5 10*3/uL (ref 1.4–6.5)
Neutrophils Relative %: 43 %
Platelets: 202 10*3/uL (ref 150–440)
RBC: 3.85 MIL/uL — ABNORMAL LOW (ref 4.40–5.90)
RDW: 14 % (ref 11.5–14.5)
WBC: 3.5 10*3/uL — ABNORMAL LOW (ref 3.8–10.6)

## 2015-03-31 LAB — MAGNESIUM: Magnesium: 1.7 mg/dL (ref 1.7–2.4)

## 2015-03-31 MED ORDER — CYANOCOBALAMIN 1000 MCG/ML IJ SOLN
1000.0000 ug | Freq: Once | INTRAMUSCULAR | Status: AC
  Administered 2015-03-31: 1000 ug via INTRAMUSCULAR
  Filled 2015-03-31: qty 1

## 2015-03-31 MED ORDER — ZOLEDRONIC ACID 4 MG/100ML IV SOLN
4.0000 mg | Freq: Once | INTRAVENOUS | Status: AC
  Administered 2015-03-31: 4 mg via INTRAVENOUS

## 2015-03-31 MED ORDER — ZOLEDRONIC ACID 4 MG/5ML IV CONC: 4.0000 mg | Freq: Once | INTRAVENOUS | Status: DC

## 2015-03-31 MED ORDER — SODIUM CHLORIDE 0.9 % IV SOLN
Freq: Once | INTRAVENOUS | Status: AC
  Administered 2015-03-31: 13:00:00 via INTRAVENOUS
  Filled 2015-03-31: qty 250

## 2015-03-31 NOTE — Progress Notes (Signed)
Pt here today for follow up regarding MM; c/o hip and joint pain 5/10

## 2015-04-01 LAB — PROTEIN ELECTROPHORESIS, SERUM
A/G Ratio: 0.9 (ref 0.7–2.0)
Albumin ELP: 3.7 g/dL (ref 3.2–5.6)
Alpha-1-Globulin: 0.3 g/dL (ref 0.1–0.4)
Alpha-2-Globulin: 1 g/dL (ref 0.4–1.2)
Beta Globulin: 1.4 g/dL — ABNORMAL HIGH (ref 0.6–1.3)
Gamma Globulin: 1.5 g/dL (ref 0.5–1.6)
Globulin, Total: 4.1 g/dL (ref 2.0–4.5)
Total Protein ELP: 7.8 g/dL (ref 6.0–8.5)

## 2015-04-04 ENCOUNTER — Telehealth: Payer: Self-pay | Admitting: *Deleted

## 2015-04-04 DIAGNOSIS — I10 Essential (primary) hypertension: Secondary | ICD-10-CM | POA: Insufficient documentation

## 2015-04-04 DIAGNOSIS — C9000 Multiple myeloma not having achieved remission: Secondary | ICD-10-CM

## 2015-04-04 MED ORDER — TRAMADOL HCL 50 MG PO TABS: 50.0000 mg | ORAL_TABLET | Freq: Three times a day (TID) | ORAL | Status: DC | PRN

## 2015-04-04 NOTE — Telephone Encounter (Signed)
Rx faxed

## 2015-04-08 ENCOUNTER — Telehealth: Payer: Self-pay | Admitting: *Deleted

## 2015-04-08 MED ORDER — SBI/PROTEIN ISOLATE 5 G PO PACK: 1.0000 | PACK | Freq: Every day | ORAL | Status: DC

## 2015-04-08 NOTE — Telephone Encounter (Signed)
Called and told pt samples are at front desk for him.

## 2015-04-08 NOTE — Addendum Note (Signed)
Addended by: Betti Cruz on: 04/08/2015 05:07 PM   Modules accepted: Orders

## 2015-04-11 NOTE — Telephone Encounter (Signed)
  Please call patient next week and see how the Luis [name] is doing.  M

## 2015-04-16 DIAGNOSIS — I071 Rheumatic tricuspid insufficiency: Secondary | ICD-10-CM | POA: Insufficient documentation

## 2015-04-18 ENCOUNTER — Telehealth: Payer: Self-pay

## 2015-04-18 NOTE — Telephone Encounter (Signed)
[facility] [phone] ext [phone]) called to clarify REvlimid prescription; verified with Dr. Mike Gip pt is to take Revlimid 10 mg every other day

## 2015-05-01 ENCOUNTER — Inpatient Hospital Stay: Payer: BLUE CROSS/BLUE SHIELD

## 2015-05-01 ENCOUNTER — Inpatient Hospital Stay: Payer: BLUE CROSS/BLUE SHIELD | Attending: Hematology and Oncology

## 2015-05-01 ENCOUNTER — Inpatient Hospital Stay (HOSPITAL_BASED_OUTPATIENT_CLINIC_OR_DEPARTMENT_OTHER): Payer: BLUE CROSS/BLUE SHIELD | Admitting: Hematology and Oncology

## 2015-05-01 VITALS — BP 164/84 | HR 64 | Temp 96.5°F | Resp 18 | Ht 67.0 in | Wt 200.8 lb

## 2015-05-01 DIAGNOSIS — M129 Arthropathy, unspecified: Secondary | ICD-10-CM | POA: Insufficient documentation

## 2015-05-01 DIAGNOSIS — I208 Other forms of angina pectoris: Secondary | ICD-10-CM | POA: Diagnosis not present

## 2015-05-01 DIAGNOSIS — E119 Type 2 diabetes mellitus without complications: Secondary | ICD-10-CM

## 2015-05-01 DIAGNOSIS — Z7982 Long term (current) use of aspirin: Secondary | ICD-10-CM

## 2015-05-01 DIAGNOSIS — I10 Essential (primary) hypertension: Secondary | ICD-10-CM | POA: Diagnosis not present

## 2015-05-01 DIAGNOSIS — R5383 Other fatigue: Secondary | ICD-10-CM | POA: Diagnosis not present

## 2015-05-01 DIAGNOSIS — R197 Diarrhea, unspecified: Secondary | ICD-10-CM | POA: Diagnosis not present

## 2015-05-01 DIAGNOSIS — Z79899 Other long term (current) drug therapy: Secondary | ICD-10-CM | POA: Diagnosis not present

## 2015-05-01 DIAGNOSIS — C9000 Multiple myeloma not having achieved remission: Secondary | ICD-10-CM

## 2015-05-01 DIAGNOSIS — I509 Heart failure, unspecified: Secondary | ICD-10-CM | POA: Insufficient documentation

## 2015-05-01 DIAGNOSIS — K219 Gastro-esophageal reflux disease without esophagitis: Secondary | ICD-10-CM

## 2015-05-01 DIAGNOSIS — E785 Hyperlipidemia, unspecified: Secondary | ICD-10-CM | POA: Diagnosis not present

## 2015-05-01 DIAGNOSIS — E538 Deficiency of other specified B group vitamins: Secondary | ICD-10-CM

## 2015-05-01 DIAGNOSIS — M1990 Unspecified osteoarthritis, unspecified site: Secondary | ICD-10-CM | POA: Insufficient documentation

## 2015-05-01 LAB — COMPREHENSIVE METABOLIC PANEL
ALT: 18 U/L (ref 17–63)
AST: 27 U/L (ref 15–41)
Albumin: 3.7 g/dL (ref 3.5–5.0)
Alkaline Phosphatase: 46 U/L (ref 38–126)
Anion gap: 7 (ref 5–15)
BUN: 14 mg/dL (ref 6–20)
CO2: 24 mmol/L (ref 22–32)
Calcium: 8.5 mg/dL — ABNORMAL LOW (ref 8.9–10.3)
Chloride: 107 mmol/L (ref 101–111)
Creatinine, Ser: 0.72 mg/dL (ref 0.61–1.24)
GFR calc Af Amer: >60 mL/min (ref >60)
GFR calc non Af Amer: >60 mL/min (ref >60)
Glucose, Bld: 158 mg/dL — ABNORMAL HIGH (ref 65–99)
Potassium: 3.6 mmol/L (ref 3.5–5.1)
Sodium: 138 mmol/L (ref 135–145)
Total Bilirubin: 0.6 mg/dL (ref 0.3–1.2)
Total Protein: 7.7 g/dL (ref 6.5–8.1)

## 2015-05-01 LAB — CBC WITH DIFFERENTIAL/PLATELET
Basophils Absolute: 0.1 10*3/uL (ref 0–0.1)
Basophils Relative: 3 %
Eosinophils Absolute: 0.3 10*3/uL (ref 0–0.7)
Eosinophils Relative: 10 %
HCT: 38.8 % — ABNORMAL LOW (ref 40.0–52.0)
Hemoglobin: 13.2 g/dL (ref 13.0–18.0)
Lymphocytes Relative: 32 %
Lymphs Abs: 1.1 10*3/uL (ref 1.0–3.6)
MCH: 35.3 pg — ABNORMAL HIGH (ref 26.0–34.0)
MCHC: 34 g/dL (ref 32.0–36.0)
MCV: 103.7 fL — ABNORMAL HIGH (ref 80.0–100.0)
Monocytes Absolute: 0.5 10*3/uL (ref 0.2–1.0)
Monocytes Relative: 15 %
Neutro Abs: 1.3 10*3/uL — ABNORMAL LOW (ref 1.4–6.5)
Neutrophils Relative %: 40 %
Platelets: 190 10*3/uL (ref 150–440)
RBC: 3.74 MIL/uL — ABNORMAL LOW (ref 4.40–5.90)
RDW: 13.8 % (ref 11.5–14.5)
WBC: 3.3 10*3/uL — ABNORMAL LOW (ref 3.8–10.6)

## 2015-05-01 MED ORDER — CYANOCOBALAMIN 1000 MCG/ML IJ SOLN
1000.0000 ug | Freq: Once | INTRAMUSCULAR | Status: AC
  Administered 2015-05-01: 1000 ug via INTRAMUSCULAR
  Filled 2015-05-01: qty 1

## 2015-05-01 NOTE — Progress Notes (Signed)
[facility] day:  03/31/2015  Chief Complaint: Danny Yackley. is a 69 y.o. male with multiple myeloma on Revlimid who is seen for 1 month assessment.  HPI: The patient was last seen in the medical oncology clinic on 02/17/2015.  At that time, he was having more issues with diarrhea that was affecting his quality of life. He denied any bone pain or infection. His calcium was slightly low. We discussed his diarrhea with episodic constipation. It was felt unlikely related to his low dose Revlimid. Stool studies were requested.  Enterogam was discussed. We also discussed the use of Imodium. He was to follow-up with his gastroenterologist at the [facility] clinic. He was to continue his Revlimid 10 mg every other day.  He received Zometa on 02/17/2015.  Symptomatically he states that his bowel movements may be normal then 2-3 hours mostly liquid.  Diarrhea comes on quickly. He may have 3 episodes of diarrhea a day.  He notes that some days are good and some days not so good. He feels fatigue and at times fights to stay awake. His muscles are sore. He has some arthritis.  Past Medical History  Diagnosis Date  . Multiple myeloma   . Hypertension   . Spinal stenosis   . GERD (gastroesophageal reflux disease)   . Diabetes mellitus without complication   . CHF (congestive heart failure)   . Arthritis   . Angina pectoris   . Hyperlipidemia   . Multiple myeloma 03/27/2015    Past Surgical History  Procedure Laterality Date  . Rotator cuff repair    . Coronary artery bypass graft    . Pilonidal cyst excision    . Total hip arthroplasty Right   . Cataract extraction    . Inguinal hernia repair    . Carpal tunnel release      No family history on file.  Social History:  reports that he does not drink alcohol or use illicit drugs. His tobacco history is not on file.  The patient is alone today.  Allergies:  Allergies  Allergen Reactions  .  Pravachol [Pravastatin]   . Statins Other (See Comments)    Muscle aches  . Zocor [Simvastatin]     Current Medications: Current Outpatient Prescriptions  Medication Sig Dispense Refill  . aspirin 81 MG tablet Take 81 mg by mouth daily as needed for pain.    [name] Pollen 500 MG CHEW Chew 1 tablet by mouth daily.    . Calcium Carb-Cholecalciferol 600-200 MG-UNIT TABS Take 1 tablet by mouth daily.    . Ferrous Sulfate (IRON) 325 (65 FE) MG TABS Take 1 tablet by mouth. 2 - 3 times  a week    . lenalidomide (REVLIMID) 15 MG capsule Take 15 mg by mouth every other day. At bedtime    . lisinopril (PRINIVIL,ZESTRIL) 10 MG tablet Take 5 mg by mouth daily.    . metFORMIN (GLUCOPHAGE) 500 MG tablet Take 500 mg by mouth daily with breakfast.    . omeprazole (PRILOSEC) 20 MG capsule Take 20 mg by mouth daily.    . potassium chloride SA (K-DUR,KLOR-CON) 20 MEQ tablet Take 20 mEq by mouth 2 (two) times daily.    Marland Kitchen zolendronic acid 4 mg in sodium chloride 0.9 % 100 mL Inject 4 mg into the vein every 30 (thirty) days.    . ALPRAZolam (XANAX) 0.25 MG tablet Take 0.25 mg by mouth at bedtime as needed. for sleep  3  . amoxicillin (AMOXIL) 500 MG capsule   1  . SBI/Protein Isolate (ENTERAGAM) 5 G PACK Take 1 packet by mouth daily.    . traMADol (ULTRAM) 50 MG tablet Take 1 tablet (50 mg total) by mouth 3 (three) times daily as needed. 60 tablet 0   No current facility-administered medications for this visit.    Review of Systems:  GENERAL:  Some good days and some bad days.  No fevers, sweats or weight loss. PERFORMANCE STATUS (ECOG):  1 HEENT:  No visual changes, runny nose, sore throat, mouth sores or tenderness. Lungs: No shortness of breath or cough.  No hemoptysis. Cardiac:  No chest pain, palpitations, orthopnea, or PND. GI:  Diarrhea, comes and goes.  No nausea, vomiting, constipation, melena or hematochezia. GU:  No urgency, frequency, dysuria, or hematuria. Musculoskeletal:  Arthritis.   Little stiff today.  No back pain.  Muscles sore. Extremities:  No pain or swelling. Skin:  No rashes or skin changes. Neuro:  No headache, numbness or weakness, balance or coordination issues. Endocrine:  No diabetes, thyroid issues, hot flashes or night sweats. Psych:  Fights depression every day.  No mood changes or anxiety. Pain:  No focal pain. Review of systems:  All other systems reviewed and found to be negative.   Physical Exam: Blood pressure 160/82, pulse 102, temperature 98.7 F (37.1 C), temperature source Tympanic, height _0  (1.702 m), weight 194 lb 14.2 oz (88.4 kg). GENERAL:  Well developed, well nourished, sitting comfortably in the exam room in no acute distress.  He has a cane at his side. MENTAL STATUS:  Alert and oriented to person, place and time. HEAD:  Pearline Cables hair.  Male pattern baldness.  Normocephalic, atraumatic, face symmetric, no Cushingoid features. EYES:  Blue eyes.  Pupils equal round and reactive to light and accomodation.  No conjunctivitis or scleral icterus. ENT:  Oropharynx clear without lesion.  Tongue normal. Mucous membranes moist.  RESPIRATORY:  Clear to auscultation without rales, wheezes or rhonchi. CARDIOVASCULAR:  Regular rate and rhythm without murmur, rub or gallop. ABDOMEN:  Soft, non-tender, with active bowel sounds, and no hepatosplenomegaly.  No masses. SKIN:  No rashes, ulcers or lesions. EXTREMITIES: No edema, no skin discoloration or tenderness.  No palpable cords. LYMPH NODES: No palpable cervical, supraclavicular, axillary or inguinal adenopathy  NEUROLOGICAL: Unremarkable. PSYCH:  Appropriate.   Appointment on 03/31/2015  Component Date Value Ref Range Status  . WBC 03/31/2015 3.5* 3.8 - 10.6 K/uL Final  . RBC 03/31/2015 3.85* 4.40 - 5.90 MIL/uL Final  . Hemoglobin 03/31/2015 13.9  13.0 - 18.0 g/dL Final  . HCT 03/31/2015 40.5  40.0 - 52.0 % Final  . MCV 03/31/2015 105.2* 80.0 - 100.0 fL Final  . MCH 03/31/2015 36.2* 26.0 -  34.0 pg Final  . MCHC 03/31/2015 34.4  32.0 - 36.0 g/dL Final  . RDW 03/31/2015 14.0  11.5 - 14.5 % Final  . Platelets 03/31/2015 202  150 - 440 K/uL Final  . Neutrophils Relative % 03/31/2015 43   Final  . Neutro Abs 03/31/2015 1.5  1.4 - 6.5 K/uL Final  . Lymphocytes Relative 03/31/2015 29   Final  . Lymphs Abs 03/31/2015 1.0  1.0 - 3.6 K/uL Final  . Monocytes Relative 03/31/2015 15   Final  . Monocytes Absolute 03/31/2015 0.5  0.2 - 1.0 K/uL Final  . Eosinophils Relative 03/31/2015 10   Final  . Eosinophils Absolute 03/31/2015 0.3  0 - 0.7 K/uL Final  .  Basophils Relative 03/31/2015 3   Final  . Basophils Absolute 03/31/2015 0.1  0 - 0.1 K/uL Final  . Sodium 03/31/2015 135  135 - 145 mmol/L Final  . Potassium 03/31/2015 3.8  3.5 - 5.1 mmol/L Final  . Chloride 03/31/2015 104  101 - 111 mmol/L Final  . CO2 03/31/2015 24  22 - 32 mmol/L Final  . Glucose, Bld 03/31/2015 118* 65 - 99 mg/dL Final  . BUN 03/31/2015 11  6 - 20 mg/dL Final  . Creatinine, Ser 03/31/2015 0.81  0.61 - 1.24 mg/dL Final  . Calcium 03/31/2015 8.6* 8.9 - 10.3 mg/dL Final  . Total Protein 03/31/2015 8.2* 6.5 - 8.1 g/dL Final  . Albumin 03/31/2015 4.0  3.5 - 5.0 g/dL Final  . AST 03/31/2015 25  15 - 41 U/L Final  . ALT 03/31/2015 14* 17 - 63 U/L Final  . Alkaline Phosphatase 03/31/2015 42  38 - 126 U/L Final  . Total Bilirubin 03/31/2015 0.7  0.3 - 1.2 mg/dL Final  . GFR calc non Af Amer 03/31/2015 >60  >60 mL/min Final  . GFR calc Af Amer 03/31/2015 >60  >60 mL/min Final   Comment: (NOTE) The eGFR has been calculated using the CKD EPI equation. This calculation has not been validated in all clinical situations. eGFR's persistently <60 mL/min signify possible Chronic Kidney Disease.   . Anion gap 03/31/2015 7  5 - 15 Final  . Magnesium 03/31/2015 1.7  1.7 - 2.4 mg/dL Final  . Total Protein ELP 03/31/2015 7.8  6.0 - 8.5 g/dL Final  . Albumin ELP 03/31/2015 3.7  3.2 - 5.6 g/dL Final  . Alpha-1-Globulin  03/31/2015 0.3  0.1 - 0.4 g/dL Final  . Alpha-2-Globulin 03/31/2015 1.0  0.4 - 1.2 g/dL Final  . Beta Globulin 03/31/2015 1.4* 0.6 - 1.3 g/dL Final  . Gamma Globulin 03/31/2015 1.5  0.5 - 1.6 g/dL Final  . M-Spike, % 03/31/2015 Not Observed  Not Observed g/dL Final  . SPE Interp. 03/31/2015 Comment   Final   Comment: (NOTE) The SPE pattern appears essentially unremarkable. Evidence of monoclonal protein is not apparent. Performed At: [facility] [place], [place] [id number] Lindon Romp MD ZP:[id number]   . Comment 03/31/2015 Comment   Final   Comment: (NOTE) Protein electrophoresis scan will follow via computer, mail, or courier delivery.   Marland Kitchen GLOBULIN, TOTAL 03/31/2015 4.1  2.0 - 4.5 g/dL Corrected  . A/G Ratio 03/31/2015 0.9  0.7 - 2.0 Corrected    Assessment:  Dalonte Hardage. is a 69 y.o. male with stage III multiple myeloma. He presented in 02/2013 with left-sided sharp pain. Evaluation revealed wide-spread lytic lesions including fracture of the left 5th rib laterally. CT scans on 02/22/2013 showed innumerable lytic lesions in thoracic spine, sternum, clavicle, scapula, and ribs.   Bone marrow biopsy on 03/13/2013 revealed 15 to 20% plasma cells. Iron stores were absent. Renal function was normal. SPEP on 03/01/2013 revealed a 1.4 gm.dl IgG monoclonal lambda. IgG was 1987.   He began induction with Revlimid 25 mg a day (3 weeks on and 1 week off) and Decadron (40 mg once a week) on 04/09/2013. SPEP has revealed no monoclonal protein since 07/26/2014 (last check 03/31/2015). IgG was 1362 on 07/26/2014 and 1097 on 12/19/2014. Light chains have been monitored: lambda free light chains 33.20 (ratio of 1.56) on 09/20/2014, 31.51 (ratio of 1.43) on 12/19/2014, and 30.25 (ratio of 1.56).  With remission, the patient's Revlimid has been decreased  to 10 mg a day then to 10 mg every other day (several months ago) secondary to issues with renal  function and diarrhea.   He receives Zometa monthly (last 02/17/2015). He takes calcium. He has macrocytic RBC indices.  B12 was 370, folate 15.4, and TSH 1.713  on 01/17/2015.  Symptomatically, he is faigued. He has diarrhea which appears unrelated to low dose Revlimid. Counts are good.  Plan: 1. Labs today:  CBC with diff, CMP, SPEP. 2. B12 and Zometa today. 3. Schedule bone survey- f/u multiple myeloma. 4. Information on Enterogam.  Patient to call if would like samples 5. RTC in 1 month for MD assess, labs (CBC, CMP, SPEP, free light chain assay), B12 +/- Zometa  Lequita Asal, MD  03/31/2015

## 2015-05-02 LAB — KAPPA/LAMBDA LIGHT CHAINS
Kappa free light chain: 47.07 mg/L — ABNORMAL HIGH (ref 3.30–19.40)
Kappa, lambda light chain ratio: 1.56 (ref 0.26–1.65)
Lambda free light chains: 30.25 mg/L — ABNORMAL HIGH (ref 5.71–26.30)

## 2015-05-06 ENCOUNTER — Other Ambulatory Visit: Payer: Self-pay

## 2015-05-06 MED ORDER — SBI/PROTEIN ISOLATE 5 G PO PACK: 1.0000 | PACK | Freq: Every day | ORAL | Status: DC

## 2015-05-18 ENCOUNTER — Other Ambulatory Visit: Payer: Self-pay | Admitting: Hematology and Oncology

## 2015-05-20 ENCOUNTER — Other Ambulatory Visit: Payer: Self-pay

## 2015-05-22 ENCOUNTER — Other Ambulatory Visit: Payer: Self-pay

## 2015-05-22 ENCOUNTER — Other Ambulatory Visit: Payer: Self-pay | Admitting: *Deleted

## 2015-05-22 MED ORDER — LENALIDOMIDE 10 MG PO CAPS: 10.0000 mg | ORAL_CAPSULE | ORAL | Status: DC

## 2015-05-22 MED ORDER — LENALIDOMIDE 15 MG PO CAPS: 10.0000 mg | ORAL_CAPSULE | ORAL | Status: DC

## 2015-05-29 ENCOUNTER — Inpatient Hospital Stay: Payer: BLUE CROSS/BLUE SHIELD | Attending: Hematology and Oncology | Admitting: Hematology and Oncology

## 2015-05-29 ENCOUNTER — Other Ambulatory Visit: Payer: Medicare Other

## 2015-05-29 ENCOUNTER — Inpatient Hospital Stay: Payer: BLUE CROSS/BLUE SHIELD

## 2015-05-29 ENCOUNTER — Ambulatory Visit: Payer: Medicare Other

## 2015-05-29 ENCOUNTER — Telehealth: Payer: Self-pay

## 2015-05-29 ENCOUNTER — Ambulatory Visit: Payer: Medicare Other | Admitting: Hematology and Oncology

## 2015-05-29 ENCOUNTER — Other Ambulatory Visit: Payer: Self-pay

## 2015-05-29 VITALS — BP 168/80 | HR 64 | Temp 98.3°F | Ht 67.0 in | Wt 197.3 lb

## 2015-05-29 DIAGNOSIS — K219 Gastro-esophageal reflux disease without esophagitis: Secondary | ICD-10-CM | POA: Diagnosis not present

## 2015-05-29 DIAGNOSIS — E119 Type 2 diabetes mellitus without complications: Secondary | ICD-10-CM | POA: Diagnosis not present

## 2015-05-29 DIAGNOSIS — I208 Other forms of angina pectoris: Secondary | ICD-10-CM | POA: Diagnosis not present

## 2015-05-29 DIAGNOSIS — E538 Deficiency of other specified B group vitamins: Secondary | ICD-10-CM

## 2015-05-29 DIAGNOSIS — E785 Hyperlipidemia, unspecified: Secondary | ICD-10-CM | POA: Diagnosis not present

## 2015-05-29 DIAGNOSIS — M4800 Spinal stenosis, site unspecified: Secondary | ICD-10-CM | POA: Insufficient documentation

## 2015-05-29 DIAGNOSIS — C9001 Multiple myeloma in remission: Secondary | ICD-10-CM | POA: Diagnosis not present

## 2015-05-29 DIAGNOSIS — I10 Essential (primary) hypertension: Secondary | ICD-10-CM | POA: Diagnosis not present

## 2015-05-29 DIAGNOSIS — Z7982 Long term (current) use of aspirin: Secondary | ICD-10-CM | POA: Insufficient documentation

## 2015-05-29 DIAGNOSIS — M129 Arthropathy, unspecified: Secondary | ICD-10-CM | POA: Diagnosis not present

## 2015-05-29 DIAGNOSIS — D709 Neutropenia, unspecified: Secondary | ICD-10-CM

## 2015-05-29 DIAGNOSIS — Z79899 Other long term (current) drug therapy: Secondary | ICD-10-CM | POA: Diagnosis not present

## 2015-05-29 DIAGNOSIS — I509 Heart failure, unspecified: Secondary | ICD-10-CM | POA: Insufficient documentation

## 2015-05-29 DIAGNOSIS — C9000 Multiple myeloma not having achieved remission: Secondary | ICD-10-CM

## 2015-05-29 LAB — CBC WITH DIFFERENTIAL/PLATELET
Basophils Absolute: 0.1 10*3/uL (ref 0–0.1)
Basophils Relative: 4 %
Eosinophils Absolute: 0.3 10*3/uL (ref 0–0.7)
Eosinophils Relative: 11 %
HCT: 40.3 % (ref 40.0–52.0)
Hemoglobin: 13.8 g/dL (ref 13.0–18.0)
Lymphocytes Relative: 37 %
Lymphs Abs: 1 10*3/uL (ref 1.0–3.6)
MCH: 35.1 pg — ABNORMAL HIGH (ref 26.0–34.0)
MCHC: 34.1 g/dL (ref 32.0–36.0)
MCV: 103 fL — ABNORMAL HIGH (ref 80.0–100.0)
Monocytes Absolute: 0.4 10*3/uL (ref 0.2–1.0)
Monocytes Relative: 15 %
Neutro Abs: 0.9 10*3/uL — ABNORMAL LOW (ref 1.4–6.5)
Neutrophils Relative %: 33 %
Platelets: 201 10*3/uL (ref 150–440)
RBC: 3.91 MIL/uL — ABNORMAL LOW (ref 4.40–5.90)
RDW: 13.8 % (ref 11.5–14.5)
WBC: 2.8 10*3/uL — ABNORMAL LOW (ref 3.8–10.6)

## 2015-05-29 LAB — COMPREHENSIVE METABOLIC PANEL
ALT: 18 U/L (ref 17–63)
AST: 30 U/L (ref 15–41)
Albumin: 3.9 g/dL (ref 3.5–5.0)
Alkaline Phosphatase: 47 U/L (ref 38–126)
Anion gap: 7 (ref 5–15)
BUN: 11 mg/dL (ref 6–20)
CO2: 25 mmol/L (ref 22–32)
Calcium: 8.6 mg/dL — ABNORMAL LOW (ref 8.9–10.3)
Chloride: 106 mmol/L (ref 101–111)
Creatinine, Ser: 0.78 mg/dL (ref 0.61–1.24)
GFR calc Af Amer: >60 mL/min (ref >60)
GFR calc non Af Amer: >60 mL/min (ref >60)
Glucose, Bld: 101 mg/dL — ABNORMAL HIGH (ref 65–99)
Potassium: 3.6 mmol/L (ref 3.5–5.1)
Sodium: 138 mmol/L (ref 135–145)
Total Bilirubin: 0.7 mg/dL (ref 0.3–1.2)
Total Protein: 8 g/dL (ref 6.5–8.1)

## 2015-05-29 LAB — VITAMIN B12: Vitamin B-12: 250 pg/mL (ref 180–914)

## 2015-05-29 MED ORDER — TRAMADOL HCL 50 MG PO TABS: 50.0000 mg | ORAL_TABLET | Freq: Three times a day (TID) | ORAL | Status: DC | PRN

## 2015-05-29 MED ORDER — CYANOCOBALAMIN 1000 MCG/ML IJ SOLN
1000.0000 ug | Freq: Once | INTRAMUSCULAR | Status: AC
  Administered 2015-05-29: 1000 ug via INTRAMUSCULAR
  Filled 2015-05-29: qty 1

## 2015-05-29 NOTE — Progress Notes (Signed)
[facility] day:  05/29/2015  Chief Complaint: Luis [name]. is a 69 y.o. male with multiple myeloma on Revlimid who is seen for 1 month assessment.  HPI: The patient was last seen in the medical oncology clinic on 05/01/2015.  At that time, he was seen for monthly assessment.  Symptomatically, his diarrhea had improved on Enterogam.  CBC revealed a hematocrit of 38.8, hemoglobin 13.2, platelets 190,000, WBC 3300 and ANC 1300.  Kappa free light chains were 47.07, lambda free light chains 30.25 with a ratio of 1.56 (normal).  SPEP on 03/31/2015 was negative.  During the interim, he has done well.  He saw gastroenterology. He has cut down on eating cantelope.  He is eating yogurt and taking Citrical. He has about 2 bowel movements a day. He Is taking Revlimid 10 mg every other day. He denies any bone pain.  He did not have a bone survey (orderd at last visit).  Past Medical History  Diagnosis Date  . Multiple myeloma   . Hypertension   . Spinal stenosis   . GERD (gastroesophageal reflux disease)   . Diabetes mellitus without complication   . CHF (congestive heart failure)   . Arthritis   . Angina pectoris   . Hyperlipidemia   . Multiple myeloma 03/27/2015    Past Surgical History  Procedure Laterality Date  . Rotator cuff repair    . Coronary artery bypass graft    . Pilonidal cyst excision    . Total hip arthroplasty Right   . Cataract extraction    . Inguinal hernia repair    . Carpal tunnel release      No family history on file.  Social History:  reports that he does not drink alcohol or use illicit drugs. His tobacco history is not on file.  The patient is alone today.  Allergies:  Allergies  Allergen Reactions  . Pravachol [Pravastatin]   . Pravastatin Sodium Other (See Comments)  . Statins Other (See Comments)    Muscle aches  . Zocor [Simvastatin]     Current Medications: Current Outpatient Prescriptions   Medication Sig Dispense Refill  . ALPRAZolam (XANAX) 0.25 MG tablet Take 0.25 mg by mouth at bedtime as needed. for sleep  3  . amoxicillin (AMOXIL) 500 MG capsule   1  . aspirin 81 MG tablet Take 81 mg by mouth daily as needed for pain.    [name] Pollen 500 MG CHEW Chew 1 tablet by mouth daily.    . Calcium Carb-Cholecalciferol 600-200 MG-UNIT TABS Take 1 tablet by mouth daily.    . Ferrous Sulfate (IRON) 325 (65 FE) MG TABS Take 1 tablet by mouth. 2 - 3 times  a week    . lenalidomide (REVLIMID) 10 MG capsule Take 1 capsule (10 mg total) by mouth every other day. Auth # [id number] 14 capsule 0  . lisinopril (PRINIVIL,ZESTRIL) 10 MG tablet Take 5 mg by mouth daily.    . metFORMIN (GLUCOPHAGE) 500 MG tablet Take 500 mg by mouth daily with breakfast.    . omeprazole (PRILOSEC) 20 MG capsule Take 20 mg by mouth daily.    . potassium chloride SA (K-DUR,KLOR-CON) 20 MEQ tablet Take 20 mEq by mouth 2 (two) times daily.    . SBI/Protein Isolate (ENTERAGAM) 5 G PACK Take 1 packet by mouth daily. 30 each 2  . traMADol (ULTRAM) 50 MG tablet Take 1 tablet (50 mg total) by mouth 3 (three)  times daily as needed. 60 tablet 0  . zolendronic acid 4 mg in sodium chloride 0.9 % 100 mL Inject 4 mg into the vein every 30 (thirty) days.     No current facility-administered medications for this visit.    Review of Systems:  GENERAL:  Feels good. No fevers, sweats or weight loss. PERFORMANCE STATUS (ECOG):  1 HEENT:  No visual changes, runny nose, sore throat, mouth sores or tenderness. Lungs: No shortness of breath or cough.  No hemoptysis. Cardiac:  No chest pain, palpitations, orthopnea, or PND. GI:  Diarrhea improved.  No nausea, vomiting, constipation, melena or hematochezia. GU:  No urgency, frequency, dysuria, or hematuria. Musculoskeletal:  Spinal stenosis.  Steroid injection today. No joint pain.  No muscle tenderness. Extremities:  No pain or swelling. Skin:  No rashes or skin changes. Neuro:  No  headache, numbness or weakness, balance or coordination issues. Endocrine:  No diabetes, thyroid issues, hot flashes or night sweats. Psych:  No mood changes, depression or anxiety. Pain:  No focal pain. Review of systems:  All other systems reviewed and found to be negative.   Physical Exam: Blood pressure 168/80, pulse 64, temperature 98.3 F (36.8 C), temperature source Tympanic, height '5\' 7"'  (1.702 m), weight 197 lb 5 oz (89.5 kg). GENERAL:  Well developed, well nourished, sitting comfortably in the exam room in no acute distress. MENTAL STATUS:  Alert and oriented to person, place and time. HEAD:  Gtray hair.  Male pattern baldness.  Normocephalic, atraumatic, face symmetric, no Cushingoid features. EYES:  Blue eyes.  Pupils equal round and reactive to light and accomodation.  No conjunctivitis or scleral icterus. ENT:  Oropharynx clear without lesion.  Tongue normal. Mucous membranes moist.  RESPIRATORY:  Clear to auscultation without rales, wheezes or rhonchi. CARDIOVASCULAR:  Regular rate and rhythm without murmur, rub or gallop. ABDOMEN:  Soft, non-tender, with active bowel sounds, and no hepatosplenomegaly.  No masses. SKIN:  No rashes, ulcers or lesions. EXTREMITIES: No edema, no skin discoloration or tenderness.  No palpable cords. LYMPH NODES: No palpable cervical, supraclavicular, axillary or inguinal adenopathy  NEUROLOGICAL: Unremarkable. PSYCH:  Appropriate.   Appointment on 05/29/2015  Component Date Value Ref Range Status  . WBC 05/29/2015 2.8* 3.8 - 10.6 K/uL Final  . RBC 05/29/2015 3.91* 4.40 - 5.90 MIL/uL Final  . Hemoglobin 05/29/2015 13.8  13.0 - 18.0 g/dL Final  . HCT 05/29/2015 40.3  40.0 - 52.0 % Final  . MCV 05/29/2015 103.0* 80.0 - 100.0 fL Final  . MCH 05/29/2015 35.1* 26.0 - 34.0 pg Final  . MCHC 05/29/2015 34.1  32.0 - 36.0 g/dL Final  . RDW 05/29/2015 13.8  11.5 - 14.5 % Final  . Platelets 05/29/2015 201  150 - 440 K/uL Final  . Neutrophils Relative  % 05/29/2015 33   Final  . Neutro Abs 05/29/2015 0.9* 1.4 - 6.5 K/uL Final  . Lymphocytes Relative 05/29/2015 37   Final  . Lymphs Abs 05/29/2015 1.0  1.0 - 3.6 K/uL Final  . Monocytes Relative 05/29/2015 15   Final  . Monocytes Absolute 05/29/2015 0.4  0.2 - 1.0 K/uL Final  . Eosinophils Relative 05/29/2015 11   Final  . Eosinophils Absolute 05/29/2015 0.3  0 - 0.7 K/uL Final  . Basophils Relative 05/29/2015 4   Final  . Basophils Absolute 05/29/2015 0.1  0 - 0.1 K/uL Final  . Sodium 05/29/2015 138  135 - 145 mmol/L Final  . Potassium 05/29/2015 3.6  3.5 - 5.1  mmol/L Final  . Chloride 05/29/2015 106  101 - 111 mmol/L Final  . CO2 05/29/2015 25  22 - 32 mmol/L Final  . Glucose, Bld 05/29/2015 101* 65 - 99 mg/dL Final  . BUN 05/29/2015 11  6 - 20 mg/dL Final  . Creatinine, Ser 05/29/2015 0.78  0.61 - 1.24 mg/dL Final  . Calcium 05/29/2015 8.6* 8.9 - 10.3 mg/dL Final  . Total Protein 05/29/2015 8.0  6.5 - 8.1 g/dL Final  . Albumin 05/29/2015 3.9  3.5 - 5.0 g/dL Final  . AST 05/29/2015 30  15 - 41 U/L Final  . ALT 05/29/2015 18  17 - 63 U/L Final  . Alkaline Phosphatase 05/29/2015 47  38 - 126 U/L Final  . Total Bilirubin 05/29/2015 0.7  0.3 - 1.2 mg/dL Final  . GFR calc non Af Amer 05/29/2015 >60  >60 mL/min Final  . GFR calc Af Amer 05/29/2015 >60  >60 mL/min Final   Comment: (NOTE) The eGFR has been calculated using the CKD EPI equation. This calculation has not been validated in all clinical situations. eGFR's persistently <60 mL/min signify possible Chronic Kidney Disease.   . Anion gap 05/29/2015 7  5 - 15 Final  . Vitamin B-12 05/29/2015 250  180 - 914 pg/mL Final   Comment: (NOTE) This assay is not validated for testing neonatal or myeloproliferative syndrome specimens for Vitamin B12 levels. Performed at [facility]     Assessment:  Luis Lavallee Mauricio Dahlen. is a 69 y.o. male with stage III multiple myeloma.  He presented in 02/2013 with left-sided sharp pain.   Evaluation revealed  wide-spread lytic lesions including fracture of the left 5th rib laterally.  CT scans on 02/22/2013 showed innumerable lytic lesions in thoracic spine, sternum, clavicle, scapula, and ribs.    Bone marrow biopsy on 03/13/2013 revealed 15 to 20% plasma cells. Iron stores were absent.  Renal function was normal.  SPEP on 03/01/2013 revealed a 1.4 gm.dl IgG monoclonal lambda.  IgG was 1987.   He began induction with Revlimid 25 mg a day (3 weeks on and 1 week off) and Decadron (40 mg once a week) on 04/09/2013.  SPEP has revealed no monoclonal protein since 07/26/2014 (last check 03/31/2015).  IgG was 1362 on 07/26/2014 and 1097 on 12/19/2014.  Light chains have been monitored:  lambda free light chains 33.20 (ratio of 1.56) on 09/20/2014, 31.51 (ratio of 1.43) on 12/19/2014, and 30.25 (ratio of 1.56).  With remission, the patient's Revlimid has been decreased to 10 mg a day then to 10 mg every other day (several months ago) secondary to issues with renal function and diarrhea.    He receives Zometa monthly (last 03/31/2015).  He takes calcium 600 mg BID.  He takes iron 2-3 times a week.  He receives B12 monthly (last 05/01/2015).  B12 was 370 on 01/17/2015.  Symptomatically, he is doing well.  Bowel movements are less frequent.  He is neutropenic.  Plan: 1. Labs today:  CBC with diff, CMP, B12. 2. B12 today. 3. Hold Revlimid. 4. Review fever and neutropenia precautions. 5. RTC in 1 week for CBC with diff 6. Reschedule bone survey. 7. RTC in 1 month for MD assessment, labs (CBC with diff, BMP), Zometa, and B12.   Lequita Asal, MD  05/29/2015

## 2015-05-29 NOTE — Progress Notes (Signed)
Pt here today for follow up regarding B 12 deficiency and B 12 injection; currently REvlimid 10 mg every other day; c/o mild abdominal and leg pain 3/10; saw gastroenterolgist 2 weeks ago and placed on yogurt and Citracell; diarrhea has improved per pt; needs rerill on Tramadol and states he was getting a steroid injection in his back this afternoon for spinal stenosis

## 2015-05-30 ENCOUNTER — Ambulatory Visit
Admission: RE | Admit: 2015-05-30 | Discharge: 2015-05-30 | Disposition: A | Discharge status: Home / Self Care | Payer: BLUE CROSS/BLUE SHIELD | Source: Ambulatory Visit | Attending: Hematology and Oncology | Admitting: Hematology and Oncology

## 2015-05-30 DIAGNOSIS — I700 Atherosclerosis of aorta: Secondary | ICD-10-CM | POA: Insufficient documentation

## 2015-05-30 DIAGNOSIS — C9000 Multiple myeloma not having achieved remission: Secondary | ICD-10-CM | POA: Insufficient documentation

## 2015-06-02 ENCOUNTER — Telehealth: Payer: Self-pay

## 2015-06-02 NOTE — Telephone Encounter (Signed)
Called pt to inform him that his rx for enteragam had been faxed to [phone]; per rep should receive the medication in 3-5 business days and should be $59/box; he will receive a call from a [phone] and to please answer; also he can go to the [email or web address] website and register for a copay card; pt verbalized understanding of this

## 2015-06-05 ENCOUNTER — Inpatient Hospital Stay: Payer: BLUE CROSS/BLUE SHIELD

## 2015-06-05 ENCOUNTER — Other Ambulatory Visit: Payer: Self-pay

## 2015-06-05 DIAGNOSIS — C9000 Multiple myeloma not having achieved remission: Secondary | ICD-10-CM

## 2015-06-05 DIAGNOSIS — C9001 Multiple myeloma in remission: Secondary | ICD-10-CM | POA: Diagnosis not present

## 2015-06-05 LAB — CBC WITH DIFFERENTIAL/PLATELET
Basophils Absolute: 0.1 10*3/uL (ref 0–0.1)
Basophils Relative: 3 %
Eosinophils Absolute: 0.2 10*3/uL (ref 0–0.7)
Eosinophils Relative: 5 %
HCT: 39.2 % — ABNORMAL LOW (ref 40.0–52.0)
Hemoglobin: 13.3 g/dL (ref 13.0–18.0)
Lymphocytes Relative: 33 %
Lymphs Abs: 1.3 10*3/uL (ref 1.0–3.6)
MCH: 35.1 pg — ABNORMAL HIGH (ref 26.0–34.0)
MCHC: 33.8 g/dL (ref 32.0–36.0)
MCV: 103.6 fL — ABNORMAL HIGH (ref 80.0–100.0)
Monocytes Absolute: 0.6 10*3/uL (ref 0.2–1.0)
Monocytes Relative: 15 %
Neutro Abs: 1.7 10*3/uL (ref 1.4–6.5)
Neutrophils Relative %: 44 %
Platelets: 154 10*3/uL (ref 150–440)
RBC: 3.78 MIL/uL — ABNORMAL LOW (ref 4.40–5.90)
RDW: 13.8 % (ref 11.5–14.5)
WBC: 3.8 10*3/uL (ref 3.8–10.6)

## 2015-06-25 NOTE — Telephone Encounter (Signed)
She lvm regarding Revlomid but the message was garbled. Please call: [phone]

## 2015-06-25 NOTE — Telephone Encounter (Signed)
  Can you sort this out?  M

## 2015-06-26 ENCOUNTER — Inpatient Hospital Stay: Payer: BLUE CROSS/BLUE SHIELD | Attending: Hematology and Oncology

## 2015-06-26 ENCOUNTER — Inpatient Hospital Stay: Payer: BLUE CROSS/BLUE SHIELD

## 2015-06-26 ENCOUNTER — Telehealth: Payer: Self-pay

## 2015-06-26 ENCOUNTER — Inpatient Hospital Stay (HOSPITAL_BASED_OUTPATIENT_CLINIC_OR_DEPARTMENT_OTHER): Payer: BLUE CROSS/BLUE SHIELD | Admitting: Hematology and Oncology

## 2015-06-26 ENCOUNTER — Encounter: Payer: Self-pay | Admitting: Hematology and Oncology

## 2015-06-26 ENCOUNTER — Other Ambulatory Visit: Payer: Self-pay | Admitting: Family Medicine

## 2015-06-26 VITALS — BP 146/80 | HR 60 | Temp 96.6°F | Resp 20 | Wt 202.2 lb

## 2015-06-26 DIAGNOSIS — K219 Gastro-esophageal reflux disease without esophagitis: Secondary | ICD-10-CM

## 2015-06-26 DIAGNOSIS — I10 Essential (primary) hypertension: Secondary | ICD-10-CM | POA: Insufficient documentation

## 2015-06-26 DIAGNOSIS — D519 Vitamin B12 deficiency anemia, unspecified: Secondary | ICD-10-CM

## 2015-06-26 DIAGNOSIS — Z87891 Personal history of nicotine dependence: Secondary | ICD-10-CM | POA: Diagnosis not present

## 2015-06-26 DIAGNOSIS — E876 Hypokalemia: Secondary | ICD-10-CM

## 2015-06-26 DIAGNOSIS — I208 Other forms of angina pectoris: Secondary | ICD-10-CM | POA: Insufficient documentation

## 2015-06-26 DIAGNOSIS — Z79899 Other long term (current) drug therapy: Secondary | ICD-10-CM | POA: Insufficient documentation

## 2015-06-26 DIAGNOSIS — M129 Arthropathy, unspecified: Secondary | ICD-10-CM

## 2015-06-26 DIAGNOSIS — C9001 Multiple myeloma in remission: Secondary | ICD-10-CM | POA: Diagnosis not present

## 2015-06-26 DIAGNOSIS — Z7982 Long term (current) use of aspirin: Secondary | ICD-10-CM

## 2015-06-26 DIAGNOSIS — I509 Heart failure, unspecified: Secondary | ICD-10-CM | POA: Diagnosis not present

## 2015-06-26 DIAGNOSIS — E119 Type 2 diabetes mellitus without complications: Secondary | ICD-10-CM

## 2015-06-26 DIAGNOSIS — C9000 Multiple myeloma not having achieved remission: Secondary | ICD-10-CM

## 2015-06-26 DIAGNOSIS — M4800 Spinal stenosis, site unspecified: Secondary | ICD-10-CM

## 2015-06-26 DIAGNOSIS — E538 Deficiency of other specified B group vitamins: Secondary | ICD-10-CM

## 2015-06-26 LAB — CBC WITH DIFFERENTIAL/PLATELET
Basophils Absolute: 0.1 10*3/uL (ref 0–0.1)
Basophils Relative: 2 %
Eosinophils Absolute: 0.5 10*3/uL (ref 0–0.7)
Eosinophils Relative: 13 %
HCT: 37.8 % — ABNORMAL LOW (ref 40.0–52.0)
Hemoglobin: 13.3 g/dL (ref 13.0–18.0)
Lymphocytes Relative: 25 %
Lymphs Abs: 0.9 10*3/uL — ABNORMAL LOW (ref 1.0–3.6)
MCH: 35.5 pg — ABNORMAL HIGH (ref 26.0–34.0)
MCHC: 35.2 g/dL (ref 32.0–36.0)
MCV: 100.7 fL — ABNORMAL HIGH (ref 80.0–100.0)
Monocytes Absolute: 0.5 10*3/uL (ref 0.2–1.0)
Monocytes Relative: 14 %
Neutro Abs: 1.6 10*3/uL (ref 1.4–6.5)
Neutrophils Relative %: 46 %
Platelets: 181 10*3/uL (ref 150–440)
RBC: 3.75 MIL/uL — ABNORMAL LOW (ref 4.40–5.90)
RDW: 14.2 % (ref 11.5–14.5)
WBC: 3.5 10*3/uL — ABNORMAL LOW (ref 3.8–10.6)

## 2015-06-26 LAB — BASIC METABOLIC PANEL
Anion gap: 8 (ref 5–15)
BUN: 12 mg/dL (ref 6–20)
CO2: 23 mmol/L (ref 22–32)
Calcium: 8.4 mg/dL — ABNORMAL LOW (ref 8.9–10.3)
Chloride: 105 mmol/L (ref 101–111)
Creatinine, Ser: 0.73 mg/dL (ref 0.61–1.24)
GFR calc Af Amer: >60 mL/min (ref >60)
GFR calc non Af Amer: >60 mL/min (ref >60)
Glucose, Bld: 162 mg/dL — ABNORMAL HIGH (ref 65–99)
Potassium: 3.1 mmol/L — ABNORMAL LOW (ref 3.5–5.1)
Sodium: 136 mmol/L (ref 135–145)

## 2015-06-26 MED ORDER — ZOLEDRONIC ACID 4 MG/100ML IV SOLN
4.0000 mg | Freq: Once | INTRAVENOUS | Status: AC
  Administered 2015-06-26: 4 mg via INTRAVENOUS
  Filled 2015-06-26: qty 100

## 2015-06-26 MED ORDER — CYANOCOBALAMIN 1000 MCG/ML IJ SOLN
1000.0000 ug | Freq: Once | INTRAMUSCULAR | Status: AC
  Administered 2015-06-26: 1000 ug via INTRAMUSCULAR
  Filled 2015-06-26: qty 1

## 2015-06-26 MED ORDER — SODIUM CHLORIDE 0.9 % IV SOLN
Freq: Once | INTRAVENOUS | Status: AC
  Administered 2015-06-26: 13:00:00 via INTRAVENOUS
  Filled 2015-06-26: qty 1000

## 2015-06-26 MED ORDER — LENALIDOMIDE 10 MG PO CAPS
10.0000 mg | ORAL_CAPSULE | ORAL | Status: DC
Start: 2015-06-26 — End: 2015-11-11

## 2015-06-26 NOTE — Progress Notes (Signed)
Patient here today for follow up B12 deficiency and MM. State he has been doing well. Reports his bowel movements are less and he is feeling better since starting the SBI/Protein Isolate.  States he has pain 3/10 on left side of abdomen where he has a hernia that he will be seeing Dr. Tamala Julian for repair.

## 2015-06-26 NOTE — Progress Notes (Signed)
[facility] day:  06/26/2015  Chief Complaint: Luis [name]. is a 69 y.o. male with multiple myeloma on Revlimid who is seen for assessment on Revlimid prior to Zometa and monthly B12.  HPI: The patient was last seen in the medical oncology clinic on 05/29/2015.  At that time, he was seen for monthly assessment.  Symptomatically, his diarrhea had improved with a change in diet (added yogurt and Citrical).  He had seen gastroenterology.    Labs on 05/29/2015 included a hematocrit of 40.3, hemoglobin 13.8, platelets 201, 000, WBC 2800 with an ANC of 900.  His Revlimid was held.  He received his monthly B12.  His bone survey was reordered.  CBC with diff on 06/05/2015 revealed a WBC of 3800 with an ANC of 1700.  Revlimid was restarted.  Bone survey on 05/30/2015 revealed the majority of small lytic lesions were stable.  There was possibly new lesion in the distal left clavicle and right mid femur (small) and possible developing lucency in the proximal left femoral shaft.  The calvarial lesion noted on the prior study was not well seen (? positional).  Last bone survey was 09/24/2014.  Symptomatically,  he is doing well. He is considering a hernia repair. He underwent a steroid injection in his back.  He stopped his calcium and switch to calcium with vitamin D. He is taking Enterogam.  Bowel movements are daily.  He continues to eat yogurt.  Past Medical History  Diagnosis Date  . Hypertension   . Spinal stenosis   . GERD (gastroesophageal reflux disease)   . CHF (congestive heart failure)   . Arthritis   . Angina pectoris   . Hyperlipidemia   . Multiple myeloma   . Multiple myeloma 03/27/2015  . Diabetes mellitus without complication     Patient takes Metformin.    Past Surgical History  Procedure Laterality Date  . Rotator cuff repair    . Coronary artery bypass graft    . Pilonidal cyst excision    . Total hip arthroplasty Right   .  Cataract extraction    . Inguinal hernia repair    . Carpal tunnel release      History reviewed. No pertinent family history.  Social History:  reports that he quit smoking about 15 years ago. His smoking use included Cigarettes. He has a 60 pack-year smoking history. He does not have any smokeless tobacco history on file. He reports that he does not drink alcohol or use illicit drugs.  The patient is alone today.  Allergies:  Allergies  Allergen Reactions  . Pravachol [Pravastatin]   . Pravastatin Sodium Other (See Comments)  . Statins Other (See Comments)    Muscle aches  . Zocor [Simvastatin]     Current Medications: Current Outpatient Prescriptions  Medication Sig Dispense Refill  . ALPRAZolam (XANAX) 0.25 MG tablet Take 0.25 mg by mouth at bedtime as needed. for sleep  3  . amoxicillin (AMOXIL) 500 MG capsule   1  . aspirin 81 MG tablet Take 81 mg by mouth daily as needed for pain.    [name] Pollen 500 MG CHEW Chew 1 tablet by mouth daily.    . Ferrous Sulfate (IRON) 325 (65 FE) MG TABS Take 1 tablet by mouth. 2 - 3 times  a week    . lisinopril (PRINIVIL,ZESTRIL) 20 MG tablet Take 1 tablet by mouth daily.    . metFORMIN (GLUCOPHAGE) 500 MG tablet  Take 500 mg by mouth daily with breakfast.    . omeprazole (PRILOSEC) 20 MG capsule Take 20 mg by mouth daily.    . SBI/Protein Isolate (ENTERAGAM) 5 G PACK Take 1 packet by mouth daily. 30 each 2  . zolendronic acid 4 mg in sodium chloride 0.9 % 100 mL Inject 4 mg into the vein every 30 (thirty) days.    . Calcium Carb-Cholecalciferol 600-200 MG-UNIT TABS Take 1 tablet by mouth daily.    Marland Kitchen lenalidomide (REVLIMID) 10 MG capsule Take 1 capsule (10 mg total) by mouth every other day. Auth # [id number] 14 capsule 0  . potassium chloride SA (K-DUR,KLOR-CON) 20 MEQ tablet Take 1 tablet (20 mEq total) by mouth daily. 30 tablet 2  . traMADol (ULTRAM) 50 MG tablet TAKE 1 TABLET BY MOUTH 3 TIMES A DAY AS NEEDED 60 tablet 0   No current  facility-administered medications for this visit.    Review of Systems:  GENERAL: Feels good. No fevers, sweats or weight loss. PERFORMANCE STATUS (ECOG): 1 HEENT: No visual changes, runny nose, sore throat, mouth sores or tenderness. Lungs: No shortness of breath or cough. No hemoptysis. Cardiac: No chest pain, palpitations, orthopnea, or PND. GI: Diarrhea improved. No nausea, vomiting, constipation, melena or hematochezia. GU: No urgency, frequency, dysuria, or hematuria. Musculoskeletal: Spinal stenosis. Steroid injection today. No joint pain. No muscle tenderness. Extremities: No pain or swelling. Skin: No rashes or skin changes. Neuro: No headache, numbness or weakness, balance or coordination issues. Endocrine: No diabetes, thyroid issues, hot flashes or night sweats. Psych: No mood changes, depression or anxiety. Pain: No focal pain. Review of systems: All other systems reviewed and found to be negative.  Physical Exam: Blood pressure 146/80, pulse 60, temperature 96.6 F (35.9 C), temperature source Tympanic, resp. rate 20, weight 202 lb 2.6 oz (91.7 kg).  GENERAL: Well developed, well nourished, sitting comfortably in the exam room in no acute distress. MENTAL STATUS: Alert and oriented to person, place and time. HEAD: Pearline Cables hair. Male pattern baldness. Normocephalic, atraumatic, face symmetric, no Cushingoid features. EYES: Blue eyes. Pupils equal round and reactive to light and accomodation. No conjunctivitis or scleral icterus. ENT: Oropharynx clear without lesion. Tongue normal. Mucous membranes moist.  RESPIRATORY: Clear to auscultation without rales, wheezes or rhonchi. CARDIOVASCULAR: Regular rate and rhythm without murmur, rub or gallop. ABDOMEN: Soft, non-tender, with active bowel sounds, and no hepatosplenomegaly. No masses. SKIN: No rashes, ulcers or lesions. EXTREMITIES: No edema, no skin discoloration or tenderness. No palpable  cords. LYMPH NODES: No palpable cervical, supraclavicular, axillary or inguinal adenopathy  NEUROLOGICAL: Unremarkable. PSYCH: Appropriate  Appointment on 06/26/2015  Component Date Value Ref Range Status  . Sodium 06/26/2015 136  135 - 145 mmol/L Final  . Potassium 06/26/2015 3.1* 3.5 - 5.1 mmol/L Final  . Chloride 06/26/2015 105  101 - 111 mmol/L Final  . CO2 06/26/2015 23  22 - 32 mmol/L Final  . Glucose, Bld 06/26/2015 162* 65 - 99 mg/dL Final  . BUN 06/26/2015 12  6 - 20 mg/dL Final  . Creatinine, Ser 06/26/2015 0.73  0.61 - 1.24 mg/dL Final  . Calcium 06/26/2015 8.4* 8.9 - 10.3 mg/dL Final  . GFR calc non Af Amer 06/26/2015 >60  >60 mL/min Final  . GFR calc Af Amer 06/26/2015 >60  >60 mL/min Final   Comment: (NOTE) The eGFR has been calculated using the CKD EPI equation. This calculation has not been validated in all clinical situations. eGFR's persistently <60 mL/min signify possible  Chronic Kidney Disease.   . Anion gap 06/26/2015 8  5 - 15 Final  . WBC 06/26/2015 3.5* 3.8 - 10.6 K/uL Final  . RBC 06/26/2015 3.75* 4.40 - 5.90 MIL/uL Final  . Hemoglobin 06/26/2015 13.3  13.0 - 18.0 g/dL Final  . HCT 06/26/2015 37.8* 40.0 - 52.0 % Final  . MCV 06/26/2015 100.7* 80.0 - 100.0 fL Final  . MCH 06/26/2015 35.5* 26.0 - 34.0 pg Final  . MCHC 06/26/2015 35.2  32.0 - 36.0 g/dL Final  . RDW 06/26/2015 14.2  11.5 - 14.5 % Final  . Platelets 06/26/2015 181  150 - 440 K/uL Final  . Neutrophils Relative % 06/26/2015 46   Final  . Neutro Abs 06/26/2015 1.6  1.4 - 6.5 K/uL Final  . Lymphocytes Relative 06/26/2015 25   Final  . Lymphs Abs 06/26/2015 0.9* 1.0 - 3.6 K/uL Final  . Monocytes Relative 06/26/2015 14   Final  . Monocytes Absolute 06/26/2015 0.5  0.2 - 1.0 K/uL Final  . Eosinophils Relative 06/26/2015 13   Final  . Eosinophils Absolute 06/26/2015 0.5  0 - 0.7 K/uL Final  . Basophils Relative 06/26/2015 2   Final  . Basophils Absolute 06/26/2015 0.1  0 - 0.1 K/uL Final     Assessment:  Keylin Podolsky. is a 69 y.o. male with stage III multiple myeloma. He presented in 02/2013 with left-sided sharp pain. Evaluation revealed wide-spread lytic lesions including fracture of the left 5th rib laterally. CT scans on 02/22/2013 showed innumerable lytic lesions in thoracic spine, sternum, clavicle, scapula, and ribs.   Bone marrow biopsy on 03/13/2013 revealed 15 to 20% plasma cells. Iron stores were absent. Renal function was normal. SPEP on 03/01/2013 revealed a 1.4 gm/dL IgG monoclonal lambda. IgG was 1987.   He began induction with Revlimid 25 mg a day (3 weeks on and 1 week off) and Decadron (40 mg once a week) on 04/09/2013. SPEP has revealed no monoclonal protein since 07/26/2014 (last check 03/31/2015). IgG was 1362 on 07/26/2014 and 1097 on 12/19/2014. Light chains have been monitored: lambda free light chains 33.20 (ratio of 1.56) on 09/20/2014, 31.51 (ratio of 1.43) on 12/19/2014, and 30.25 (ratio of 1.56) on 05/01/2015.  With remission, Revlimid was decreased to 10 mg a day then to 10 mg every other day (several months ago) secondary to issues with renal function and diarrhea.   Bone survey on 05/30/2015 revealed the majority of small lytic lesions were stable.  There was possibly new lesion in the distal left clavicle and right mid femur (small) and a possible developing lucency in the proximal left femoral shaft.  The calvarial lesion noted on the prior study was not well seen (? positional).   He receives Zometa monthly (last 03/31/2015). He takes calcium 600 mg BID. He takes iron 2-3 times a week. He receives B12 monthly (last 05/29/2015). B12 was 370 on 01/17/2015.  Symptomatically, he is doing well. Bowel movements have improved on Enterogam. Exam is stable.  He has hypokalemia.  Plan: 1. Labs today:  CBC with diff, BMP. 2. Zometa today. 3. B12 today. 4. Patient to restart K-Dur 20 meq a day. 5. Anticipate follow-up bone survey in 6  months. 6. RTC in 1 month for MD assessment and labs (CBC with diff, CMP, SPEP, ferritin, iron studies).   Lequita Asal, MD  06/26/2015, 10:58 AM

## 2015-06-26 NOTE — Telephone Encounter (Signed)
E-scribe prescription to [facility]

## 2015-07-08 ENCOUNTER — Other Ambulatory Visit: Payer: Self-pay | Admitting: Gastroenterology

## 2015-07-08 DIAGNOSIS — R1904 Left lower quadrant abdominal swelling, mass and lump: Secondary | ICD-10-CM

## 2015-07-08 DIAGNOSIS — R1032 Left lower quadrant pain: Secondary | ICD-10-CM

## 2015-07-15 ENCOUNTER — Ambulatory Visit
Admission: RE | Admit: 2015-07-15 | Discharge: 2015-07-15 | Disposition: A | Discharge status: Home / Self Care | Payer: BLUE CROSS/BLUE SHIELD | Source: Ambulatory Visit | Attending: Gastroenterology | Admitting: Gastroenterology

## 2015-07-15 DIAGNOSIS — R1904 Left lower quadrant abdominal swelling, mass and lump: Secondary | ICD-10-CM | POA: Diagnosis present

## 2015-07-15 DIAGNOSIS — M899 Disorder of bone, unspecified: Secondary | ICD-10-CM | POA: Diagnosis not present

## 2015-07-15 DIAGNOSIS — K4020 Bilateral inguinal hernia, without obstruction or gangrene, not specified as recurrent: Secondary | ICD-10-CM | POA: Diagnosis not present

## 2015-07-15 DIAGNOSIS — K5730 Diverticulosis of large intestine without perforation or abscess without bleeding: Secondary | ICD-10-CM | POA: Insufficient documentation

## 2015-07-15 DIAGNOSIS — R1032 Left lower quadrant pain: Secondary | ICD-10-CM

## 2015-07-15 MED ORDER — IOHEXOL 300 MG/ML  SOLN
100.0000 mL | Freq: Once | INTRAMUSCULAR | Status: AC | PRN
  Administered 2015-07-15: 100 mL via INTRAVENOUS

## 2015-07-22 ENCOUNTER — Telehealth: Payer: Self-pay | Admitting: *Deleted

## 2015-07-22 MED ORDER — POTASSIUM CHLORIDE CRYS ER 20 MEQ PO TBCR: 20.0000 meq | EXTENDED_RELEASE_TABLET | Freq: Every day | ORAL | Status: DC

## 2015-07-22 NOTE — Telephone Encounter (Signed)
Escribed

## 2015-07-26 ENCOUNTER — Other Ambulatory Visit: Payer: Self-pay | Admitting: Hematology and Oncology

## 2015-07-28 ENCOUNTER — Encounter: Payer: Self-pay | Admitting: Hematology and Oncology

## 2015-07-28 ENCOUNTER — Ambulatory Visit: Payer: Medicare Other | Admitting: Hematology and Oncology

## 2015-07-28 ENCOUNTER — Other Ambulatory Visit: Payer: Medicare Other

## 2015-07-29 ENCOUNTER — Inpatient Hospital Stay: Payer: BLUE CROSS/BLUE SHIELD

## 2015-07-29 ENCOUNTER — Inpatient Hospital Stay: Payer: BLUE CROSS/BLUE SHIELD | Attending: Hematology and Oncology

## 2015-07-29 ENCOUNTER — Encounter: Payer: Self-pay | Admitting: Hematology and Oncology

## 2015-07-29 ENCOUNTER — Inpatient Hospital Stay (HOSPITAL_BASED_OUTPATIENT_CLINIC_OR_DEPARTMENT_OTHER): Payer: BLUE CROSS/BLUE SHIELD | Admitting: Hematology and Oncology

## 2015-07-29 VITALS — BP 146/71 | HR 62 | Temp 97.9°F | Resp 20 | Ht 67.0 in | Wt 200.0 lb

## 2015-07-29 DIAGNOSIS — E785 Hyperlipidemia, unspecified: Secondary | ICD-10-CM

## 2015-07-29 DIAGNOSIS — Z8042 Family history of malignant neoplasm of prostate: Secondary | ICD-10-CM | POA: Diagnosis not present

## 2015-07-29 DIAGNOSIS — F329 Major depressive disorder, single episode, unspecified: Secondary | ICD-10-CM

## 2015-07-29 DIAGNOSIS — I509 Heart failure, unspecified: Secondary | ICD-10-CM | POA: Insufficient documentation

## 2015-07-29 DIAGNOSIS — M4800 Spinal stenosis, site unspecified: Secondary | ICD-10-CM

## 2015-07-29 DIAGNOSIS — Z7982 Long term (current) use of aspirin: Secondary | ICD-10-CM | POA: Insufficient documentation

## 2015-07-29 DIAGNOSIS — E538 Deficiency of other specified B group vitamins: Secondary | ICD-10-CM | POA: Insufficient documentation

## 2015-07-29 DIAGNOSIS — R197 Diarrhea, unspecified: Secondary | ICD-10-CM | POA: Diagnosis not present

## 2015-07-29 DIAGNOSIS — I10 Essential (primary) hypertension: Secondary | ICD-10-CM

## 2015-07-29 DIAGNOSIS — Z87891 Personal history of nicotine dependence: Secondary | ICD-10-CM

## 2015-07-29 DIAGNOSIS — K219 Gastro-esophageal reflux disease without esophagitis: Secondary | ICD-10-CM | POA: Diagnosis not present

## 2015-07-29 DIAGNOSIS — Z79899 Other long term (current) drug therapy: Secondary | ICD-10-CM

## 2015-07-29 DIAGNOSIS — E119 Type 2 diabetes mellitus without complications: Secondary | ICD-10-CM

## 2015-07-29 DIAGNOSIS — K469 Unspecified abdominal hernia without obstruction or gangrene: Secondary | ICD-10-CM | POA: Diagnosis not present

## 2015-07-29 DIAGNOSIS — D519 Vitamin B12 deficiency anemia, unspecified: Secondary | ICD-10-CM

## 2015-07-29 DIAGNOSIS — M129 Arthropathy, unspecified: Secondary | ICD-10-CM

## 2015-07-29 DIAGNOSIS — C9000 Multiple myeloma not having achieved remission: Secondary | ICD-10-CM | POA: Diagnosis not present

## 2015-07-29 LAB — CBC WITH DIFFERENTIAL/PLATELET
Basophils Absolute: 0.1 10*3/uL (ref 0–0.1)
Basophils Relative: 3 %
Eosinophils Absolute: 0.4 10*3/uL (ref 0–0.7)
Eosinophils Relative: 11 %
HCT: 40 % (ref 40.0–52.0)
Hemoglobin: 14 g/dL (ref 13.0–18.0)
Lymphocytes Relative: 30 %
Lymphs Abs: 1 10*3/uL (ref 1.0–3.6)
MCH: 35.7 pg — ABNORMAL HIGH (ref 26.0–34.0)
MCHC: 34.9 g/dL (ref 32.0–36.0)
MCV: 102.3 fL — ABNORMAL HIGH (ref 80.0–100.0)
Monocytes Absolute: 0.4 10*3/uL (ref 0.2–1.0)
Monocytes Relative: 14 %
Neutro Abs: 1.4 10*3/uL (ref 1.4–6.5)
Neutrophils Relative %: 42 %
Platelets: 190 10*3/uL (ref 150–440)
RBC: 3.91 MIL/uL — ABNORMAL LOW (ref 4.40–5.90)
RDW: 13.5 % (ref 11.5–14.5)
WBC: 3.3 10*3/uL — ABNORMAL LOW (ref 3.8–10.6)

## 2015-07-29 LAB — COMPREHENSIVE METABOLIC PANEL
ALT: 20 U/L (ref 17–63)
AST: 28 U/L (ref 15–41)
Albumin: 4.1 g/dL (ref 3.5–5.0)
Alkaline Phosphatase: 44 U/L (ref 38–126)
Anion gap: 7 (ref 5–15)
BUN: 14 mg/dL (ref 6–20)
CO2: 23 mmol/L (ref 22–32)
Calcium: 8.6 mg/dL — ABNORMAL LOW (ref 8.9–10.3)
Chloride: 103 mmol/L (ref 101–111)
Creatinine, Ser: 0.88 mg/dL (ref 0.61–1.24)
GFR calc Af Amer: >60 mL/min (ref >60)
GFR calc non Af Amer: >60 mL/min (ref >60)
Glucose, Bld: 119 mg/dL — ABNORMAL HIGH (ref 65–99)
Potassium: 4 mmol/L (ref 3.5–5.1)
Sodium: 133 mmol/L — ABNORMAL LOW (ref 135–145)
Total Bilirubin: 0.6 mg/dL (ref 0.3–1.2)
Total Protein: 8 g/dL (ref 6.5–8.1)

## 2015-07-29 LAB — IRON AND TIBC
Iron: 99 ug/dL (ref 45–182)
Saturation Ratios: 30 % (ref 17.9–39.5)
TIBC: 332 ug/dL (ref 250–450)
UIBC: 233 ug/dL

## 2015-07-29 LAB — FERRITIN: Ferritin: 334 ng/mL (ref 24–336)

## 2015-07-29 MED ORDER — CYANOCOBALAMIN 1000 MCG/ML IJ SOLN
1000.0000 ug | Freq: Once | INTRAMUSCULAR | Status: AC
  Administered 2015-07-29: 1000 ug via INTRAMUSCULAR
  Filled 2015-07-29: qty 1

## 2015-07-29 NOTE — Progress Notes (Signed)
[facility] day:  07/29/2015   Chief Complaint: Luis [name]. is a 69 y.o. male with multiple myeloma on Revlimid who is seen for assessment on Revlimid prior to monthly B12.  HPI: The patient was last seen in the medical oncology clinic on 06/26/2015.  At that time, he was doing well. He was considering a hernia repair. He recently had a steroid injection in his back.  He stopped his calcium and switch to calcium with vitamin D. He was taking Enterogam.  Bowel movements were daily.  He continues to eat yogurt.  He received B12 and Zometa.  Symptomatically, he notes good days and bad days. He has some episodes of diarrhea. He is taking Enterogam 1 a day. He notes a left hernia for years. For the past 3-4 months, is has been painful. Surgery is planned on 08/15/2015. He is waiting approval by cardiology (stress test and echo on 08/13/2015).  He denies any heavy lifting for the past month.  He notes that he fights depression. He tries to keep busy. 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  He meets from 10 am to 1 pm then has lunch.  Past Medical History  Diagnosis Date  . Hypertension   . Spinal stenosis   . GERD (gastroesophageal reflux disease)   . CHF (congestive heart failure)   . Arthritis   . Angina pectoris   . Hyperlipidemia   . Multiple myeloma   . Multiple myeloma 03/27/2015  . Diabetes mellitus without complication     Patient takes Metformin.    Past Surgical History  Procedure Laterality Date  . Rotator cuff repair    . Coronary artery bypass graft    . Pilonidal cyst excision    . Total hip arthroplasty Right   . Cataract extraction    . Inguinal hernia repair    . Carpal tunnel release      Family History  Problem Relation Age of Onset  . Heart disease Father   . Stroke Mother   . Prostate cancer Maternal Grandfather 6    Social History:  reports that he quit smoking about 15 years ago.  His smoking use included Cigarettes. He has a 60 pack-year smoking history. He quit smokeless tobacco use about 15 years ago. He reports that he does not drink alcohol or use illicit drugs.  The patient is alone today.  Allergies:  Allergies  Allergen Reactions  . Pravachol [Pravastatin]   . Pravastatin Sodium Other (See Comments)  . Statins Other (See Comments)    Muscle aches  . Zocor [Simvastatin]     Current Medications: Current Outpatient Prescriptions  Medication Sig Dispense Refill  . ALPRAZolam (XANAX) 0.25 MG tablet Take 0.25 mg by mouth at bedtime as needed. for sleep  3  . amoxicillin (AMOXIL) 500 MG capsule   1  . aspirin 81 MG tablet Take 81 mg by mouth daily as needed for pain.    [name] Pollen 500 MG CHEW Chew 1 tablet by mouth daily.    . Calcium Carb-Cholecalciferol 600-200 MG-UNIT TABS Take 1 tablet by mouth daily.    . Ferrous Sulfate (IRON) 325 (65 FE) MG TABS Take 1 tablet by mouth. 2 - 3 times  a week    . lenalidomide (REVLIMID) 10 MG capsule Take 1 capsule (10 mg total) by mouth every other day. Auth # [id number] 14 capsule 0  . lisinopril (PRINIVIL,ZESTRIL) 20  MG tablet Take 1 tablet by mouth daily.    . metFORMIN (GLUCOPHAGE) 500 MG tablet Take 500 mg by mouth daily with breakfast.    . omeprazole (PRILOSEC) 20 MG capsule Take 20 mg by mouth daily.    . potassium chloride SA (K-DUR,KLOR-CON) 20 MEQ tablet Take 1 tablet (20 mEq total) by mouth daily. 30 tablet 2  . SBI/Protein Isolate (ENTERAGAM) 5 G PACK Take 1 packet by mouth daily. 30 each 2  . traMADol (ULTRAM) 50 MG tablet TAKE 1 TABLET BY MOUTH 3 TIMES A DAY AS NEEDED 60 tablet 0  . zolendronic acid 4 mg in sodium chloride 0.9 % 100 mL Inject 4 mg into the vein every 30 (thirty) days.     No current facility-administered medications for this visit.    Review of Systems:  GENERAL: Good and bad days. No fevers, sweats or weight loss. PERFORMANCE STATUS (ECOG): 1 HEENT: No visual changes, runny nose,  sore throat, mouth sores or tenderness. Lungs: No shortness of breath or cough. No hemoptysis. Cardiac: No chest pain, palpitations, orthopnea, or PND.  Cardiology clearance in process. GI: Diarrhea on Enterogam No nausea, vomiting, constipation, melena or hematochezia. GU: No urgency, frequency, dysuria, or hematuria. Musculoskeletal: Spinal stenosis. No joint pain. No muscle tenderness. Extremities: No pain or swelling. Skin: No rashes or skin changes. Neuro: No headache, numbness or weakness, balance or coordination issues. Endocrine: Diabetes.  No thyroid issues, hot flashes or night sweats. Psych: fights depression, stays busy.  No mood changes, depression or anxiety. Pain: No focal pain. Review of systems: All other systems reviewed and found to be negative.  Physical Exam: Blood pressure 146/71, pulse 62, temperature 97.9 F (36.6 C), temperature source Tympanic, resp. rate 20, height _0  (1.702 m), weight 199 lb 15.3 oz (90.7 kg).  GENERAL: Well developed, well nourished, sitting comfortably in the exam room in no acute distress. MENTAL STATUS: Alert and oriented to person, place and time. HEAD: Short white hair. Male pattern baldness. Normocephalic, atraumatic, face symmetric, no Cushingoid features. EYES: Blue eyes. Pupils equal round and reactive to light and accomodation. No conjunctivitis or scleral icterus. ENT: Oropharynx clear without lesion. Tongue normal. Mucous membranes moist.  RESPIRATORY: Clear to auscultation without rales, wheezes or rhonchi. CARDIOVASCULAR: Regular rate and rhythm without murmur, rub or gallop. ABDOMEN: Soft, non-tender, with active bowel sounds, and no hepatosplenomegaly. No masses. SKIN: No rashes, ulcers or lesions. EXTREMITIES: No edema, no skin discoloration or tenderness. No palpable cords. LYMPH NODES: No palpable cervical, supraclavicular, axillary or inguinal adenopathy  NEUROLOGICAL:  Unremarkable. PSYCH: Appropriate  Appointment on 07/29/2015  Component Date Value Ref Range Status  . WBC 07/29/2015 3.3* 3.8 - 10.6 K/uL Final  . RBC 07/29/2015 3.91* 4.40 - 5.90 MIL/uL Final  . Hemoglobin 07/29/2015 14.0  13.0 - 18.0 g/dL Final  . HCT 07/29/2015 40.0  40.0 - 52.0 % Final  . MCV 07/29/2015 102.3* 80.0 - 100.0 fL Final  . MCH 07/29/2015 35.7* 26.0 - 34.0 pg Final  . MCHC 07/29/2015 34.9  32.0 - 36.0 g/dL Final  . RDW 07/29/2015 13.5  11.5 - 14.5 % Final  . Platelets 07/29/2015 190  150 - 440 K/uL Final  . Neutrophils Relative % 07/29/2015 42   Final  . Neutro Abs 07/29/2015 1.4  1.4 - 6.5 K/uL Final  . Lymphocytes Relative 07/29/2015 30   Final  . Lymphs Abs 07/29/2015 1.0  1.0 - 3.6 K/uL Final  . Monocytes Relative 07/29/2015 14   Final  .  Monocytes Absolute 07/29/2015 0.4  0.2 - 1.0 K/uL Final  . Eosinophils Relative 07/29/2015 11   Final  . Eosinophils Absolute 07/29/2015 0.4  0 - 0.7 K/uL Final  . Basophils Relative 07/29/2015 3   Final  . Basophils Absolute 07/29/2015 0.1  0 - 0.1 K/uL Final  . Sodium 07/29/2015 133* 135 - 145 mmol/L Final  . Potassium 07/29/2015 4.0  3.5 - 5.1 mmol/L Final  . Chloride 07/29/2015 103  101 - 111 mmol/L Final  . CO2 07/29/2015 23  22 - 32 mmol/L Final  . Glucose, Bld 07/29/2015 119* 65 - 99 mg/dL Final  . BUN 07/29/2015 14  6 - 20 mg/dL Final  . Creatinine, Ser 07/29/2015 0.88  0.61 - 1.24 mg/dL Final  . Calcium 07/29/2015 8.6* 8.9 - 10.3 mg/dL Final  . Total Protein 07/29/2015 8.0  6.5 - 8.1 g/dL Final  . Albumin 07/29/2015 4.1  3.5 - 5.0 g/dL Final  . AST 07/29/2015 28  15 - 41 U/L Final  . ALT 07/29/2015 20  17 - 63 U/L Final  . Alkaline Phosphatase 07/29/2015 44  38 - 126 U/L Final  . Total Bilirubin 07/29/2015 0.6  0.3 - 1.2 mg/dL Final  . GFR calc non Af Amer 07/29/2015 >60  >60 mL/min Final  . GFR calc Af Amer 07/29/2015 >60  >60 mL/min Final   Comment: (NOTE) The eGFR has been calculated using the CKD EPI  equation. This calculation has not been validated in all clinical situations. eGFR's persistently <60 mL/min signify possible Chronic Kidney Disease.   . Anion gap 07/29/2015 7  5 - 15 Final  . Ferritin 07/29/2015 334  24 - 336 ng/mL Final    Assessment:  Luis [name]. is a 69 y.o. male with stage III multiple myeloma. He presented in 02/2013 with left-sided sharp pain. Evaluation revealed wide-spread lytic lesions including fracture of the left 5th rib laterally. CT scans on 02/22/2013 showed innumerable lytic lesions in thoracic spine, sternum, clavicle, scapula, and ribs.   Bone marrow biopsy on 03/13/2013 revealed 15 to 20% plasma cells. Iron stores were absent. Renal function was normal. SPEP on 03/01/2013 revealed a 1.4 gm/dL IgG monoclonal lambda. IgG was 1987.   He began induction with Revlimid 25 mg a day (3 weeks on and 1 week off) and Decadron (40 mg once a week) on 04/09/2013. SPEP has revealed no monoclonal protein since 07/26/2014 (last check 03/31/2015). IgG was 1362 on 07/26/2014 and 1097 on 12/19/2014. Light chains have been monitored: lambda free light chains 33.20 (ratio of 1.56) on 09/20/2014, 31.51 (ratio of 1.43) on 12/19/2014, and 30.25 (ratio of 1.56) on 05/01/2015.  With remission, Revlimid was decreased to 10 mg a day then to 10 mg every other day (several months ago) secondary to issues with renal function and diarrhea.   Bone survey on 05/30/2015 revealed the majority of small lytic lesions were stable.  There was possibly new lesion in the distal left clavicle and right mid femur (small) and a possible developing lucency in the proximal left femoral shaft.  The calvarial lesion noted on the prior study was not well seen (? positional).   He receives Zometa every 3 months (last 06/26/2015). He takes calcium 600 mg BID. He takes iron 2-3 times a week. He receives B12 monthly (last 06/26/2015). B12 was 370 on 01/17/2015.  Symptomatically, he is doing  well. Bowel movements are at baseline on Enterogam. He is scheduled for a hernia repair on 08/15/2015.  Exam  is stable.  Plan: 1.  Labs today:  CBC with diff, CMP, SPEP, ferritin, iron studies. 2.  B12 today. 3.  Continue Revlimid 10 mg QOD. 4.  Anticipate follow-up bone survey 11/30/2015. 5.  RTC in 1 month for MD assessment and labs (CBC with diff, CMP, SPEP), and B12.   Lequita Asal, MD  07/29/2015, 2:52 PM

## 2015-07-30 LAB — PROTEIN ELECTROPHORESIS, SERUM
A/G Ratio: 1 (ref 0.7–1.7)
Albumin ELP: 3.6 g/dL (ref 2.9–4.4)
Alpha-1-Globulin: 0.3 g/dL (ref 0.0–0.4)
Alpha-2-Globulin: 0.8 g/dL (ref 0.4–1.0)
Beta Globulin: 1.4 g/dL — ABNORMAL HIGH (ref 0.7–1.3)
Gamma Globulin: 1.2 g/dL (ref 0.4–1.8)
Globulin, Total: 3.7 g/dL (ref 2.2–3.9)
Total Protein ELP: 7.3 g/dL (ref 6.0–8.5)

## 2015-07-31 ENCOUNTER — Telehealth: Payer: Self-pay

## 2015-07-31 ENCOUNTER — Encounter
Admission: RE | Admit: 2015-07-31 | Discharge: 2015-07-31 | Disposition: A | Discharge status: Home / Self Care | Payer: BLUE CROSS/BLUE SHIELD | Source: Ambulatory Visit | Attending: Surgery | Admitting: Surgery

## 2015-07-31 DIAGNOSIS — K439 Ventral hernia without obstruction or gangrene: Secondary | ICD-10-CM | POA: Insufficient documentation

## 2015-07-31 DIAGNOSIS — Z01818 Encounter for other preprocedural examination: Secondary | ICD-10-CM | POA: Insufficient documentation

## 2015-07-31 HISTORY — DX: Major depressive disorder, single episode, unspecified: F32.9

## 2015-07-31 HISTORY — DX: Atherosclerotic heart disease of native coronary artery without angina pectoris: I25.10

## 2015-07-31 HISTORY — DX: Reserved for inherently not codable concepts without codable children: IMO0001

## 2015-07-31 HISTORY — DX: Diverticulosis of intestine, part unspecified, without perforation or abscess without bleeding: K57.90

## 2015-07-31 HISTORY — DX: Depression, unspecified: F32.A

## 2015-07-31 HISTORY — DX: Acute myocardial infarction, unspecified: I21.9

## 2015-07-31 HISTORY — DX: Sleep apnea, unspecified: G47.30

## 2015-07-31 HISTORY — DX: Anxiety disorder, unspecified: F41.9

## 2015-07-31 NOTE — Patient Instructions (Signed)
  Your procedure is scheduled on: August 15, 2015 (Friday) Report to Day Surgery.[facility]) Second Floor To find out your arrival time please call [phone] between 1PM - 3PM on August 14, 2015 (Thursday).  Remember: Instructions that are not followed completely may result in serious medical risk, up to and including death, or upon the discretion of your surgeon and anesthesiologist your surgery may need to be rescheduled.    __x_ 1. Do not eat food or drink liquids after midnight. No gum chewing or hard candies.     ____ 2. No Alcohol for 24 hours before or after surgery.   ____ 3. Bring all medications with you on the day of surgery if instructed.    __x__ 4. Notify your doctor if there is any change in your medical condition     (cold, fever, infections).     Do not wear jewelry, make-up, hairpins, clips or nail polish.  Do not wear lotions, powders, or perfumes. You may wear deodorant.  Do not shave 48 hours prior to surgery. Men may shave face and neck.  Do not bring valuables to the hospital.    [facility] is not responsible for any belongings or valuables.               Contacts, dentures or bridgework may not be worn into surgery.  Leave your suitcase in the car. After surgery it may be brought to your room.  For patients admitted to the hospital, discharge time is determined by your                treatment team.   Patients discharged the day of surgery will not be allowed to drive home.   Please read over the following fact sheets that you were given:   Surgical Site Infection Prevention   ____ Take these medicines the morning of surgery with A SIP OF WATER:    1. Lisinopril  2. Omeprazole (Omeprazole at bedtime on October 6)  3.   4.  5.  6.  ____ Fleet Enema (as directed)   __x__ Use CHG Soap as directed  ____ Use inhalers on the day of surgery  _x___ Stop metformin 2 days prior to surgery (STOP METFORMIN ON October 5)  ____ Take 1/2 of usual  insulin dose the night before surgery and none on the morning of surgery.   __x__ Stop Coumadin/Plavix/aspirin on (STOP ASPIRIN ONE WEEK PRIOR TO SURGERY)  ____ Stop Anti-inflammatories on    __x__ Stop supplements until after surgery.  (STOP BEE POLLEN NOW)  ____ Bring C-Pap to the hospital.

## 2015-07-31 NOTE — Telephone Encounter (Signed)
Called pt per md to let him know it was ok to stop taking oral iron.  Pt also had a question that I asked Dr. Mike Gip about over the phone.  Pt asked to be off Revelemid for two days for a seafood festival wanting to avoid diarrhea,  per MD that was completely fine.  Pt aware and verbalized an understanding.

## 2015-08-15 ENCOUNTER — Ambulatory Visit: Payer: BLUE CROSS/BLUE SHIELD | Admitting: *Deleted

## 2015-08-15 ENCOUNTER — Encounter
Admission: RE | Disposition: A | Discharge status: Home / Self Care | Payer: Self-pay | Source: Ambulatory Visit | Attending: Surgery

## 2015-08-15 ENCOUNTER — Ambulatory Visit
Admission: RE | Admit: 2015-08-15 | Discharge: 2015-08-15 | Disposition: A | Discharge status: Home / Self Care | Payer: BLUE CROSS/BLUE SHIELD | Source: Ambulatory Visit | Attending: Surgery | Admitting: Surgery

## 2015-08-15 DIAGNOSIS — I509 Heart failure, unspecified: Secondary | ICD-10-CM | POA: Diagnosis not present

## 2015-08-15 DIAGNOSIS — R0602 Shortness of breath: Secondary | ICD-10-CM | POA: Diagnosis not present

## 2015-08-15 DIAGNOSIS — E119 Type 2 diabetes mellitus without complications: Secondary | ICD-10-CM | POA: Insufficient documentation

## 2015-08-15 DIAGNOSIS — Z8249 Family history of ischemic heart disease and other diseases of the circulatory system: Secondary | ICD-10-CM | POA: Insufficient documentation

## 2015-08-15 DIAGNOSIS — K439 Ventral hernia without obstruction or gangrene: Secondary | ICD-10-CM | POA: Diagnosis present

## 2015-08-15 DIAGNOSIS — Z823 Family history of stroke: Secondary | ICD-10-CM | POA: Insufficient documentation

## 2015-08-15 DIAGNOSIS — K5790 Diverticulosis of intestine, part unspecified, without perforation or abscess without bleeding: Secondary | ICD-10-CM | POA: Diagnosis not present

## 2015-08-15 DIAGNOSIS — I25119 Atherosclerotic heart disease of native coronary artery with unspecified angina pectoris: Secondary | ICD-10-CM | POA: Diagnosis not present

## 2015-08-15 DIAGNOSIS — Z8042 Family history of malignant neoplasm of prostate: Secondary | ICD-10-CM | POA: Diagnosis not present

## 2015-08-15 DIAGNOSIS — M4800 Spinal stenosis, site unspecified: Secondary | ICD-10-CM | POA: Diagnosis not present

## 2015-08-15 DIAGNOSIS — G4730 Sleep apnea, unspecified: Secondary | ICD-10-CM | POA: Diagnosis not present

## 2015-08-15 DIAGNOSIS — E785 Hyperlipidemia, unspecified: Secondary | ICD-10-CM | POA: Insufficient documentation

## 2015-08-15 DIAGNOSIS — Z9841 Cataract extraction status, right eye: Secondary | ICD-10-CM | POA: Diagnosis not present

## 2015-08-15 DIAGNOSIS — K219 Gastro-esophageal reflux disease without esophagitis: Secondary | ICD-10-CM | POA: Diagnosis not present

## 2015-08-15 DIAGNOSIS — F419 Anxiety disorder, unspecified: Secondary | ICD-10-CM | POA: Diagnosis not present

## 2015-08-15 DIAGNOSIS — Z96641 Presence of right artificial hip joint: Secondary | ICD-10-CM | POA: Diagnosis not present

## 2015-08-15 DIAGNOSIS — Z7982 Long term (current) use of aspirin: Secondary | ICD-10-CM | POA: Diagnosis not present

## 2015-08-15 DIAGNOSIS — C9000 Multiple myeloma not having achieved remission: Secondary | ICD-10-CM | POA: Diagnosis not present

## 2015-08-15 DIAGNOSIS — Z7984 Long term (current) use of oral hypoglycemic drugs: Secondary | ICD-10-CM | POA: Insufficient documentation

## 2015-08-15 DIAGNOSIS — Z951 Presence of aortocoronary bypass graft: Secondary | ICD-10-CM | POA: Diagnosis not present

## 2015-08-15 DIAGNOSIS — M1990 Unspecified osteoarthritis, unspecified site: Secondary | ICD-10-CM | POA: Insufficient documentation

## 2015-08-15 DIAGNOSIS — I252 Old myocardial infarction: Secondary | ICD-10-CM | POA: Insufficient documentation

## 2015-08-15 DIAGNOSIS — R0600 Dyspnea, unspecified: Secondary | ICD-10-CM | POA: Diagnosis not present

## 2015-08-15 DIAGNOSIS — Z79899 Other long term (current) drug therapy: Secondary | ICD-10-CM | POA: Insufficient documentation

## 2015-08-15 DIAGNOSIS — I10 Essential (primary) hypertension: Secondary | ICD-10-CM | POA: Diagnosis not present

## 2015-08-15 DIAGNOSIS — Z87891 Personal history of nicotine dependence: Secondary | ICD-10-CM | POA: Insufficient documentation

## 2015-08-15 DIAGNOSIS — I110 Hypertensive heart disease with heart failure: Secondary | ICD-10-CM | POA: Insufficient documentation

## 2015-08-15 DIAGNOSIS — Z9842 Cataract extraction status, left eye: Secondary | ICD-10-CM | POA: Insufficient documentation

## 2015-08-15 DIAGNOSIS — F329 Major depressive disorder, single episode, unspecified: Secondary | ICD-10-CM | POA: Diagnosis not present

## 2015-08-15 DIAGNOSIS — F418 Other specified anxiety disorders: Secondary | ICD-10-CM | POA: Insufficient documentation

## 2015-08-15 HISTORY — PX: VENTRAL HERNIA REPAIR: SHX424

## 2015-08-15 LAB — GLUCOSE, CAPILLARY: Glucose-Capillary: 130 mg/dL — ABNORMAL HIGH (ref 65–99)

## 2015-08-15 SURGERY — REPAIR, HERNIA, VENTRAL
Anesthesia: General | Wound class: Clean

## 2015-08-15 MED ORDER — GLYCOPYRROLATE 0.2 MG/ML IJ SOLN
INTRAMUSCULAR | Status: DC | PRN
  Administered 2015-08-15: .4 mg via INTRAVENOUS
  Administered 2015-08-15: .1 mg via INTRAVENOUS

## 2015-08-15 MED ORDER — HYDROCODONE-ACETAMINOPHEN 5-325 MG PO TABS: 1.0000 | ORAL_TABLET | ORAL | Status: DC | PRN

## 2015-08-15 MED ORDER — HYDROCODONE-ACETAMINOPHEN 5-325 MG PO TABS
ORAL_TABLET | ORAL | Status: AC
  Filled 2015-08-15: qty 1

## 2015-08-15 MED ORDER — BUPIVACAINE-EPINEPHRINE (PF) 0.5% -1:200000 IJ SOLN
INTRAMUSCULAR | Status: AC
  Filled 2015-08-15: qty 30

## 2015-08-15 MED ORDER — FENTANYL CITRATE (PF) 100 MCG/2ML IJ SOLN
25.0000 ug | INTRAMUSCULAR | Status: DC | PRN
  Administered 2015-08-15 (×4): 25 ug via INTRAVENOUS

## 2015-08-15 MED ORDER — CEFAZOLIN SODIUM-DEXTROSE 2-3 GM-% IV SOLR
INTRAVENOUS | Status: AC
  Administered 2015-08-15: 2 g via INTRAVENOUS
  Filled 2015-08-15: qty 50

## 2015-08-15 MED ORDER — HYDROCODONE-ACETAMINOPHEN 5-325 MG PO TABS
1.0000 | ORAL_TABLET | ORAL | Status: DC | PRN
  Administered 2015-08-15: 1 via ORAL

## 2015-08-15 MED ORDER — DEXAMETHASONE SODIUM PHOSPHATE 10 MG/ML IJ SOLN
INTRAMUSCULAR | Status: DC | PRN
  Administered 2015-08-15: 5 mg via INTRAVENOUS

## 2015-08-15 MED ORDER — FENTANYL CITRATE (PF) 100 MCG/2ML IJ SOLN
INTRAMUSCULAR | Status: AC
  Administered 2015-08-15: 25 ug via INTRAVENOUS
  Filled 2015-08-15: qty 2

## 2015-08-15 MED ORDER — EPHEDRINE SULFATE 50 MG/ML IJ SOLN
INTRAMUSCULAR | Status: DC | PRN
  Administered 2015-08-15: 10 mg via INTRAVENOUS
  Administered 2015-08-15: 15 mg via INTRAVENOUS

## 2015-08-15 MED ORDER — LIDOCAINE HCL (CARDIAC) 20 MG/ML IV SOLN
INTRAVENOUS | Status: DC | PRN
  Administered 2015-08-15: 40 mg via INTRAVENOUS

## 2015-08-15 MED ORDER — FENTANYL CITRATE (PF) 100 MCG/2ML IJ SOLN
INTRAMUSCULAR | Status: DC | PRN
  Administered 2015-08-15: 125 ug via INTRAVENOUS
  Administered 2015-08-15: 25 ug via INTRAVENOUS

## 2015-08-15 MED ORDER — FAMOTIDINE 20 MG PO TABS
20.0000 mg | ORAL_TABLET | Freq: Once | ORAL | Status: AC
  Administered 2015-08-15: 20 mg via ORAL

## 2015-08-15 MED ORDER — ONDANSETRON HCL 4 MG/2ML IJ SOLN: 4.0000 mg | Freq: Once | INTRAMUSCULAR | Status: DC | PRN

## 2015-08-15 MED ORDER — FAMOTIDINE 20 MG PO TABS
ORAL_TABLET | ORAL | Status: AC
  Administered 2015-08-15: 20 mg via ORAL
  Filled 2015-08-15: qty 1

## 2015-08-15 MED ORDER — NEOSTIGMINE METHYLSULFATE 10 MG/10ML IV SOLN
INTRAVENOUS | Status: DC | PRN
  Administered 2015-08-15: 3 mg via INTRAVENOUS

## 2015-08-15 MED ORDER — SODIUM CHLORIDE 0.9 % IV SOLN
INTRAVENOUS | Status: DC
  Administered 2015-08-15 (×2): via INTRAVENOUS

## 2015-08-15 MED ORDER — SUCCINYLCHOLINE CHLORIDE 20 MG/ML IJ SOLN
INTRAMUSCULAR | Status: DC | PRN
  Administered 2015-08-15: 100 mg via INTRAVENOUS

## 2015-08-15 MED ORDER — PROPOFOL 10 MG/ML IV BOLUS
INTRAVENOUS | Status: DC | PRN
  Administered 2015-08-15: 150 mg via INTRAVENOUS
  Administered 2015-08-15: 50 mg via INTRAVENOUS

## 2015-08-15 MED ORDER — ONDANSETRON HCL 4 MG/2ML IJ SOLN
INTRAMUSCULAR | Status: DC | PRN
  Administered 2015-08-15: 4 mg via INTRAVENOUS

## 2015-08-15 MED ORDER — MIDAZOLAM HCL 5 MG/5ML IJ SOLN
INTRAMUSCULAR | Status: DC | PRN
  Administered 2015-08-15: 2 mg via INTRAVENOUS

## 2015-08-15 MED ORDER — BUPIVACAINE-EPINEPHRINE 0.5% -1:200000 IJ SOLN
INTRAMUSCULAR | Status: DC | PRN
  Administered 2015-08-15: 30 mL

## 2015-08-15 MED ORDER — CEFAZOLIN SODIUM-DEXTROSE 2-3 GM-% IV SOLR
2.0000 g | Freq: Once | INTRAVENOUS | Status: AC
  Administered 2015-08-15: 2 g via INTRAVENOUS

## 2015-08-15 MED ORDER — ROCURONIUM BROMIDE 100 MG/10ML IV SOLN
INTRAVENOUS | Status: DC | PRN
  Administered 2015-08-15: 5 mg via INTRAVENOUS
  Administered 2015-08-15: 15 mg via INTRAVENOUS
  Administered 2015-08-15: 10 mg via INTRAVENOUS

## 2015-08-15 SURGICAL SUPPLY — 23 items
CANISTER SUCT 1200ML W/VALVE (MISCELLANEOUS) ×3 IMPLANT
CHLORAPREP W/TINT 26ML (MISCELLANEOUS) ×3 IMPLANT
DRAPE LAPAROTOMY 100X77 ABD (DRAPES) ×3 IMPLANT
GAUZE SPONGE 4X4 12PLY STRL (GAUZE/BANDAGES/DRESSINGS) IMPLANT
GLOVE BIO SURGEON STRL SZ7.5 (GLOVE) ×9 IMPLANT
GOWN STRL REUS W/ TWL LRG LVL3 (GOWN DISPOSABLE) ×3 IMPLANT
GOWN STRL REUS W/TWL LRG LVL3 (GOWN DISPOSABLE) ×6
KIT RM TURNOVER STRD PROC AR (KITS) ×3 IMPLANT
LABEL OR SOLS (LABEL) IMPLANT
LIQUID BAND (GAUZE/BANDAGES/DRESSINGS) ×3 IMPLANT
MESH SYNTHETIC 4X6 SOFT BARD (Mesh General) ×1 IMPLANT
MESH SYNTHETIC SOFT BARD 4X6 (Mesh General) ×2 IMPLANT
NEEDLE HYPO 25X1 1.5 SAFETY (NEEDLE) ×3 IMPLANT
NS IRRIG 500ML POUR BTL (IV SOLUTION) ×3 IMPLANT
PACK BASIN MINOR ARMC (MISCELLANEOUS) ×3 IMPLANT
PAD GROUND ADULT SPLIT (MISCELLANEOUS) ×3 IMPLANT
STAPLER SKIN PROX 35W (STAPLE) IMPLANT
SUT CHROMIC 3 0 SH 27 (SUTURE) ×3 IMPLANT
SUT MNCRL 4-0 (SUTURE) ×2
SUT MNCRL 4-0 27XMFL (SUTURE) ×1
SUT SURGILON 0 30 BLK (SUTURE) ×15 IMPLANT
SUTURE MNCRL 4-0 27XMF (SUTURE) ×1 IMPLANT
SYRINGE 10CC LL (SYRINGE) ×3 IMPLANT

## 2015-08-15 NOTE — Discharge Instructions (Addendum)
Take Tylenol or Norco if needed for pain. Should not take tramadol when taking Norco. May resume aspirin on Monday. May shower. Avoid straining and heavy lifting.AMBULATORY SURGERY  DISCHARGE INSTRUCTIONS   1) The drugs that you were given will stay in your system until tomorrow so for the next 24 hours you should not:  A) Drive an automobile B) Make any legal decisions C) Drink any alcoholic beverage   2) You may resume regular meals tomorrow.  Today it is better to start with liquids and gradually work up to solid foods.  You may eat anything you prefer, but it is better to start with liquids, then soup and crackers, and gradually work up to solid foods.   3) Please notify your doctor immediately if you have any unusual bleeding, trouble breathing, redness and pain at the surgery site, drainage, fever, or pain not relieved by medication.    4) Additional Instructions:        Please contact your physician with any problems or Same Day Surgery at [phone], Monday through Friday 6 am to 4 pm, or [place] at [facility] number at [phone].AMBULATORY SURGERY  DISCHARGE INSTRUCTIONS   5) The drugs that you were given will stay in your system until tomorrow so for the next 24 hours you should not:  D) Drive an automobile E) Make any legal decisions F) Drink any alcoholic beverage   6) You may resume regular meals tomorrow.  Today it is better to start with liquids and gradually work up to solid foods.  You may eat anything you prefer, but it is better to start with liquids, then soup and crackers, and gradually work up to solid foods.   7) Please notify your doctor immediately if you have any unusual bleeding, trouble breathing, redness and pain at the surgery site, drainage, fever, or pain not relieved by medication.    8) Additional Instructions:        Please contact your physician with any problems or Same Day Surgery at [phone], Monday  through Friday 6 am to 4 pm, or [place] at [facility] number at [phone].AMBULATORY SURGERY  DISCHARGE INSTRUCTIONS   9) The drugs that you were given will stay in your system until tomorrow so for the next 24 hours you should not:  G) Drive an automobile H) Make any legal decisions I) Drink any alcoholic beverage   10) You may resume regular meals tomorrow.  Today it is better to start with liquids and gradually work up to solid foods.  You may eat anything you prefer, but it is better to start with liquids, then soup and crackers, and gradually work up to solid foods.   11) Please notify your doctor immediately if you have any unusual bleeding, trouble breathing, redness and pain at the surgery site, drainage, fever, or pain not relieved by medication.    12) Additional Instructions:        Please contact your physician with any problems or Same Day Surgery at [phone], Monday through Friday 6 am to 4 pm, or [place] at [facility] number at [phone].AMBULATORY SURGERY  DISCHARGE INSTRUCTIONS   13) The drugs that you were given will stay in your system until tomorrow so for the next 24 hours you should not:  J) Drive an automobile K) Make any legal decisions L) Drink any alcoholic beverage   14) You may resume regular meals tomorrow.  Today it is better to start with liquids and gradually work  up to solid foods.  You may eat anything you prefer, but it is better to start with liquids, then soup and crackers, and gradually work up to solid foods.   15) Please notify your doctor immediately if you have any unusual bleeding, trouble breathing, redness and pain at the surgery site, drainage, fever, or pain not relieved by medication.    16) Additional Instructions:        Please contact your physician with any problems or Same Day Surgery at [phone], Monday through Friday 6 am to 4 pm, or [place] at [facility] number at  [phone].AMBULATORY SURGERY  DISCHARGE INSTRUCTIONS   17) The drugs that you were given will stay in your system until tomorrow so for the next 24 hours you should not:  M) Drive an automobile N) Make any legal decisions O) Drink any alcoholic beverage   18) You may resume regular meals tomorrow.  Today it is better to start with liquids and gradually work up to solid foods.  You may eat anything you prefer, but it is better to start with liquids, then soup and crackers, and gradually work up to solid foods.   19) Please notify your doctor immediately if you have any unusual bleeding, trouble breathing, redness and pain at the surgery site, drainage, fever, or pain not relieved by medication.    20) Additional Instructions:        Please contact your physician with any problems or Same Day Surgery at [phone], Monday through Friday 6 am to 4 pm, or [place] at [facility] number at [phone].

## 2015-08-15 NOTE — Op Note (Signed)
OPERATIVE REPORT  PREOPERATIVE  DIAGNOSIS: . Ventral hernia  POSTOPERATIVE DIAGNOSIS: . Spigelian ventral hernia  PROCEDURE: . Ventral hernia repair  ANESTHESIA:  General  SURGEON: Rochel Brome  MD   INDICATIONS: . He has a history of bulging and pain in the left mid abdomen. A large ventral hernia was demonstrated on physical exam. The hernias and some 15 cm with standing.  The patient was placed on the operating table in the supine position under general endotracheal anesthesia. The abdomen was prepared with ChloraPrep and draped in a sterile manner. A left lower mid abdominal incision was made oriented obliquely and was lengthened up to 11 cm.. Dissection was carried down through subcutaneous tissues. Numerous small bleeding points were cauterized. The external oblique aponeurosis was incised along the course of its fibers. There was a ventral hernia sac which was dissected free from surrounding structures. This was just lateral to the rectus muscle typical of a spigelian hernia. There was a somewhat tedious dissection undertaken. The hernia was large in size. Dissection was carried out circumferentially dissecting the hernia sac away from the muscle wall identifying the rectus muscle the internal oblique and transversus abdominis. Retractors were used for exposure. Bard soft mesh was cut to create an oval shape of 6 x 12 cm. This was placed into the properitoneal plane and was sutured to the surrounding fascia with 0 Surgilon. Some of the sutures were through and through sutures and other sutures were simple sutures. It is noted that during the course of the procedure a number of small bleeding points were cauterized and bleeding was minimal. The external oblique aponeurosis was closed over the mesh with interrupted 0 Surgilon figure-of-eight sutures. The deep fascia superior and lateral to the repair site was infiltrated with half percent Sensorcaine with epinephrine. Subcutaneous tissues were  also infiltrated. Scarpa's fascia was closed with interrupted 3-0 chromic. The skin was closed with running 4-0 Monocryl subcuticular suture and LiquiBand.  The patient tolerated the procedure satisfactorily and was prepared for transfer to the recovery room. Estimated blood loss approximately 10 cc. The operation was longer and more difficult than the typical ventral hernia repair due to its large size. The operation lasted 2 hours and 20 minutes.  Rochel Brome M.D.

## 2015-08-15 NOTE — Transfer of Care (Signed)
Immediate Anesthesia Transfer of Care Note  Patient: Luis [name].  Procedure(s) Performed: Procedure(s): VENTRAL HERNIA REPAIR WITH MESH  (N/A)  Patient Location: PACU  Anesthesia Type:General  Level of Consciousness: sedated  Airway & Oxygen Therapy: Patient Spontanous Breathing and Patient connected to face mask oxygen  Post-op Assessment: Report given to RN and Post -op Vital signs reviewed and stable  Post vital signs: Reviewed and stable  Last Vitals:  Filed Vitals:   08/15/15 1148  BP: 151/63  Pulse: 74  Temp: 38.2 C  Resp: 23    Complications: No apparent anesthesia complications

## 2015-08-15 NOTE — H&P (Signed)
Luis [name]. is an 69 y.o. male.   Chief Complaint: Bulging in the left mid abdomen HPI: He had recent office visit finding a ventral hernia in the left mid abdomen containing bowel.  Past Medical History  Diagnosis Date  . Hypertension   . Spinal stenosis   . GERD (gastroesophageal reflux disease)   . CHF (congestive heart failure) ([place])   . Arthritis   . Angina pectoris ([place])   . Hyperlipidemia   . Multiple myeloma ([place])   . Multiple myeloma ([place]) 03/27/2015  . Diabetes mellitus without complication [facility])     Patient takes Metformin.  . Sleep apnea     No CPAP @ present  . Shortness of breath dyspnea   . Anxiety   . Depression   . Coronary artery disease   . Diverticulosis   . Myocardial infarction [facility]) April 2001    widowmaker   recent cardiac stress test was normal  Past Surgical History  Procedure Laterality Date  . Rotator cuff repair    . Coronary artery bypass graft    . Pilonidal cyst excision    . Total hip arthroplasty Right   . Cataract extraction    . Inguinal hernia repair    . Carpal tunnel release    . Cardiac catheterization    . Joint replacement Right 2008    Right Total Hip Replacement  . Eye surgery Bilateral     Cataract Extraction    Family History  Problem Relation Age of Onset  . Heart disease Father   . Stroke Mother   . Prostate cancer Maternal Grandfather 80   Social History:  reports that he quit smoking about 15 years ago. His smoking use included Cigarettes. He has a 60 pack-year smoking history. He quit smokeless tobacco use about 15 years ago. He reports that he does not drink alcohol or use illicit drugs.  Allergies:  Allergies  Allergen Reactions  . Pravachol [Pravastatin]   . Pravastatin Sodium Other (See Comments)  . Statins Other (See Comments)    Muscle aches  . Zocor [Simvastatin] Other (See Comments)    Muscle aches    Medications Prior to Admission  Medication Sig Dispense Refill  . ALPRAZolam (XANAX) 0.25  MG tablet Take 0.25 mg by mouth at bedtime as needed. for sleep  3  . aspirin 81 MG tablet Take 81 mg by mouth daily.     [name] Pollen 500 MG CHEW Chew 1 tablet by mouth daily.    . Calcium Carb-Cholecalciferol 600-200 MG-UNIT TABS Take 1 tablet by mouth daily.    . ergocalciferol (VITAMIN D2) 50000 UNITS capsule Take 50,000 Units by mouth once a week.    . Ferrous Sulfate (IRON) 325 (65 FE) MG TABS Take 1 tablet by mouth. 2 - 3 times  a week    . lenalidomide (REVLIMID) 10 MG capsule Take 1 capsule (10 mg total) by mouth every other day. Auth # [id number] 14 capsule 0  . lovastatin (MEVACOR) 40 MG tablet Take 40 mg by mouth at bedtime.    Marland Kitchen omeprazole (PRILOSEC) 20 MG capsule Take 20 mg by mouth daily.    . potassium chloride SA (K-DUR,KLOR-CON) 20 MEQ tablet Take 1 tablet (20 mEq total) by mouth daily. 30 tablet 2  . SBI/Protein Isolate (ENTERAGAM) 5 G PACK Take 1 packet by mouth daily. 30 each 2  . traMADol (ULTRAM) 50 MG tablet TAKE 1 TABLET BY MOUTH 3 TIMES A DAY AS NEEDED 60  tablet 0  . vitamin B-12 (CYANOCOBALAMIN) 1000 MCG tablet Take 1,000 mcg by mouth. 2-3 times a week    . amoxicillin (AMOXIL) 500 MG capsule One hour prior to dental appointment (4 capsules)  1  . lisinopril (PRINIVIL,ZESTRIL) 20 MG tablet Take 1 tablet by mouth daily.    . metFORMIN (GLUCOPHAGE) 500 MG tablet Take 500 mg by mouth daily with breakfast.    . zolendronic acid 4 mg in sodium chloride 0.9 % 100 mL Inject 4 mg into the vein every 30 (thirty) days.      Results for orders placed or performed during the hospital encounter of 08/15/15 (from the past 48 hour(s))  Glucose, capillary     Status: Abnormal   Collection Time: 08/15/15  7:44 AM  Result Value Ref Range   Glucose-Capillary 130 (H) 65 - 99 mg/dL   No results found.  ROS he reports no recent acute illness such as cough cold or sore throat. He reports his been eating satisfactorily moving his bowels satisfactorily and voiding satisfactorily.  Blood  pressure 169/54, pulse 61, temperature 98.5 F (36.9 C), temperature source Oral, resp. rate 20, height '5\' 7"'  (1.702 m), weight 88.905 kg (196 lb), SpO2 98 %. Physical Exam he is awake alert and oriented.  HEENT:  Head is normocephalic.  Pupils are equal reactive to light.  Extraocular movements are intact. Sclera is clear.  Pharynx is clear.  LUNGS:  Clear without rales rhonchi or wheezes.  HEART:  Regular rhythm S1-S2, without murmur.  Abdomen is moderately obese. There is a palpable bulge in the left lower quadrant which is approximately 10 cm in dimension and reducible.  Assessment/Plan Diagnosis is ventral hernia. Plan is ventral hernia repair as discussed  Rochel Brome 08/15/2015, 8:43 AM

## 2015-08-15 NOTE — Anesthesia Postprocedure Evaluation (Signed)
  Anesthesia Post-op Note  Patient: Luis [name].  Procedure(s) Performed: Procedure(s): VENTRAL HERNIA REPAIR WITH MESH  (N/A)  Anesthesia type:General  Patient location: PACU  Post pain: Pain level controlled  Post assessment: Post-op Vital signs reviewed, Patient's Cardiovascular Status Stable, Respiratory Function Stable, Patent Airway and No signs of Nausea or vomiting  Post vital signs: Reviewed and stable  Last Vitals:  Filed Vitals:   08/15/15 0745  BP: 169/54  Pulse: 61  Temp: 36.9 C  Resp: 20    Level of consciousness: awake, alert  and patient cooperative  Complications: No apparent anesthesia complications

## 2015-08-15 NOTE — Anesthesia Preprocedure Evaluation (Signed)
Anesthesia Evaluation  Patient identified by MRN, date of birth, ID band Patient awake    Airway Mallampati: I  TM Distance: >3 FB Neck ROM: Limited    Dental  (+) Teeth Intact   Pulmonary shortness of breath, sleep apnea , former smoker,    Pulmonary exam normal        Cardiovascular Exercise Tolerance: Poor hypertension, + angina with exertion + CAD, + Past MI and +CHF  Normal cardiovascular exam     Neuro/Psych Anxiety Depression    GI/Hepatic GERD  Medicated and Controlled,  Endo/Other  diabetes, Poorly Controlled, Type 2  Renal/GU      Musculoskeletal  (+) Arthritis , Osteoarthritis,    Abdominal (+) + obese,   Peds  Hematology   Anesthesia Other Findings   Reproductive/Obstetrics                             Anesthesia Physical Anesthesia Plan  ASA: III  Anesthesia Plan: General   Post-op Pain Management:    Induction: Intravenous  Airway Management Planned: Oral ETT  Additional Equipment:   Intra-op Plan:   Post-operative Plan: Extubation in OR  Informed Consent: I have reviewed the patients History and Physical, chart, labs and discussed the procedure including the risks, benefits and alternatives for the proposed anesthesia with the patient or authorized representative who has indicated his/her understanding and acceptance.     Plan Discussed with: CRNA  Anesthesia Plan Comments:         Anesthesia Quick Evaluation

## 2015-08-15 NOTE — Anesthesia Procedure Notes (Signed)
Procedure Name: Intubation Date/Time: 08/15/2015 9:08 AM Performed by: Kennon Holter Pre-anesthesia Checklist: Patient identified, Emergency Drugs available, Suction available, Patient being monitored and Timeout performed Patient Re-evaluated:Patient Re-evaluated prior to inductionOxygen Delivery Method: Circle system utilized Preoxygenation: Pre-oxygenation with 100% oxygen Intubation Type: IV induction Ventilation: Mask ventilation without difficulty Laryngoscope Size: Miller and 2 Grade View: Grade II Tube type: Oral Tube size: 7.5 mm Number of attempts: 1 Airway Equipment and Method: Stylet Placement Confirmation: ETT inserted through vocal cords under direct vision,  positive ETCO2 and breath sounds checked- equal and bilateral Secured at: 23.5 cm Dental Injury: Teeth and Oropharynx as per pre-operative assessment  Difficulty Due To: Difficulty was anticipated Comments: Very limited neck extension, on two pillows.  Grade 2.  Would recommend Mac 4 and have Glidescope immediately available.  Poor dentation is noted.  Justine Null, CRNA

## 2015-09-01 ENCOUNTER — Inpatient Hospital Stay: Payer: BLUE CROSS/BLUE SHIELD

## 2015-09-01 ENCOUNTER — Inpatient Hospital Stay: Payer: BLUE CROSS/BLUE SHIELD | Attending: Hematology and Oncology | Admitting: Hematology and Oncology

## 2015-09-01 ENCOUNTER — Other Ambulatory Visit: Payer: Self-pay

## 2015-09-01 VITALS — BP 138/68 | HR 65 | Temp 98.7°F | Resp 18 | Ht 67.0 in | Wt 202.6 lb

## 2015-09-01 DIAGNOSIS — Z8719 Personal history of other diseases of the digestive system: Secondary | ICD-10-CM | POA: Diagnosis not present

## 2015-09-01 DIAGNOSIS — M4800 Spinal stenosis, site unspecified: Secondary | ICD-10-CM | POA: Diagnosis not present

## 2015-09-01 DIAGNOSIS — E538 Deficiency of other specified B group vitamins: Secondary | ICD-10-CM

## 2015-09-01 DIAGNOSIS — C9000 Multiple myeloma not having achieved remission: Secondary | ICD-10-CM

## 2015-09-01 DIAGNOSIS — M129 Arthropathy, unspecified: Secondary | ICD-10-CM | POA: Insufficient documentation

## 2015-09-01 DIAGNOSIS — F419 Anxiety disorder, unspecified: Secondary | ICD-10-CM | POA: Diagnosis not present

## 2015-09-01 DIAGNOSIS — Z79899 Other long term (current) drug therapy: Secondary | ICD-10-CM | POA: Diagnosis not present

## 2015-09-01 DIAGNOSIS — Z7982 Long term (current) use of aspirin: Secondary | ICD-10-CM

## 2015-09-01 DIAGNOSIS — F329 Major depressive disorder, single episode, unspecified: Secondary | ICD-10-CM | POA: Diagnosis not present

## 2015-09-01 DIAGNOSIS — Z7984 Long term (current) use of oral hypoglycemic drugs: Secondary | ICD-10-CM | POA: Insufficient documentation

## 2015-09-01 DIAGNOSIS — G4730 Sleep apnea, unspecified: Secondary | ICD-10-CM | POA: Diagnosis not present

## 2015-09-01 DIAGNOSIS — E785 Hyperlipidemia, unspecified: Secondary | ICD-10-CM | POA: Diagnosis not present

## 2015-09-01 DIAGNOSIS — I2510 Atherosclerotic heart disease of native coronary artery without angina pectoris: Secondary | ICD-10-CM | POA: Diagnosis not present

## 2015-09-01 DIAGNOSIS — I252 Old myocardial infarction: Secondary | ICD-10-CM

## 2015-09-01 DIAGNOSIS — I509 Heart failure, unspecified: Secondary | ICD-10-CM | POA: Diagnosis not present

## 2015-09-01 DIAGNOSIS — Z87891 Personal history of nicotine dependence: Secondary | ICD-10-CM | POA: Insufficient documentation

## 2015-09-01 DIAGNOSIS — R197 Diarrhea, unspecified: Secondary | ICD-10-CM | POA: Insufficient documentation

## 2015-09-01 DIAGNOSIS — Z8042 Family history of malignant neoplasm of prostate: Secondary | ICD-10-CM | POA: Insufficient documentation

## 2015-09-01 DIAGNOSIS — I10 Essential (primary) hypertension: Secondary | ICD-10-CM | POA: Diagnosis not present

## 2015-09-01 DIAGNOSIS — K219 Gastro-esophageal reflux disease without esophagitis: Secondary | ICD-10-CM | POA: Diagnosis not present

## 2015-09-01 DIAGNOSIS — K469 Unspecified abdominal hernia without obstruction or gangrene: Secondary | ICD-10-CM | POA: Diagnosis not present

## 2015-09-01 DIAGNOSIS — E119 Type 2 diabetes mellitus without complications: Secondary | ICD-10-CM | POA: Insufficient documentation

## 2015-09-01 LAB — COMPREHENSIVE METABOLIC PANEL
ALT: 15 U/L — ABNORMAL LOW (ref 17–63)
AST: 26 U/L (ref 15–41)
Albumin: 3.9 g/dL (ref 3.5–5.0)
Alkaline Phosphatase: 48 U/L (ref 38–126)
Anion gap: 6 (ref 5–15)
BUN: 12 mg/dL (ref 6–20)
CO2: 25 mmol/L (ref 22–32)
Calcium: 8.2 mg/dL — ABNORMAL LOW (ref 8.9–10.3)
Chloride: 108 mmol/L (ref 101–111)
Creatinine, Ser: 0.76 mg/dL (ref 0.61–1.24)
GFR calc Af Amer: >60 mL/min (ref >60)
GFR calc non Af Amer: >60 mL/min (ref >60)
Glucose, Bld: 146 mg/dL — ABNORMAL HIGH (ref 65–99)
Potassium: 3.8 mmol/L (ref 3.5–5.1)
Sodium: 139 mmol/L (ref 135–145)
Total Bilirubin: 0.5 mg/dL (ref 0.3–1.2)
Total Protein: 7.5 g/dL (ref 6.5–8.1)

## 2015-09-01 LAB — CBC WITH DIFFERENTIAL/PLATELET
Basophils Absolute: 0.1 10*3/uL (ref 0–0.1)
Basophils Relative: 2 %
Eosinophils Absolute: 0.3 10*3/uL (ref 0–0.7)
Eosinophils Relative: 9 %
HCT: 38.2 % — ABNORMAL LOW (ref 40.0–52.0)
Hemoglobin: 13.3 g/dL (ref 13.0–18.0)
Lymphocytes Relative: 30 %
Lymphs Abs: 1 10*3/uL (ref 1.0–3.6)
MCH: 35.4 pg — ABNORMAL HIGH (ref 26.0–34.0)
MCHC: 34.7 g/dL (ref 32.0–36.0)
MCV: 101.9 fL — ABNORMAL HIGH (ref 80.0–100.0)
Monocytes Absolute: 0.4 10*3/uL (ref 0.2–1.0)
Monocytes Relative: 12 %
Neutro Abs: 1.5 10*3/uL (ref 1.4–6.5)
Neutrophils Relative %: 47 %
Platelets: 236 10*3/uL (ref 150–440)
RBC: 3.75 MIL/uL — ABNORMAL LOW (ref 4.40–5.90)
RDW: 13.8 % (ref 11.5–14.5)
WBC: 3.3 10*3/uL — ABNORMAL LOW (ref 3.8–10.6)

## 2015-09-01 LAB — FERRITIN: Ferritin: 376 ng/mL — ABNORMAL HIGH (ref 24–336)

## 2015-09-01 LAB — IRON AND TIBC
Iron: 139 ug/dL (ref 45–182)
Saturation Ratios: 46 % — ABNORMAL HIGH (ref 17.9–39.5)
TIBC: 299 ug/dL (ref 250–450)
UIBC: 160 ug/dL

## 2015-09-01 MED ORDER — CYANOCOBALAMIN 1000 MCG/ML IJ SOLN
1000.0000 ug | Freq: Once | INTRAMUSCULAR | Status: AC
  Administered 2015-09-01: 1000 ug via INTRAMUSCULAR

## 2015-09-01 NOTE — Progress Notes (Signed)
Patient is here for follow-up of multiple myeloma. Patient had surgery on 08/15/15 for a ventral hernia. He states that he is still kind of sore from the surgery. He states that he is still having diarrhea and is working to control it.

## 2015-09-01 NOTE — Progress Notes (Signed)
[facility] day:  09/01/2015   Chief Complaint: Luis Parmer. is a 69 y.o. male with multiple myeloma on Revlimid who is seen for assessment on Revlimid prior tomonthly B12.  HPI: The patient was last seen in the medical oncology clinic on 07/29/2015.  At that time, he was doing well on Revlimid.  Bowel movements were stable on Enterogram.  He was planning a hernia repair on 08/15/2015.  He received B12.  During the interim, he has had a rough month. He states he had his ventral hernia repair on 08/15/2015.  It is  taking him a while to get over this. Pain pills cause constipation. He is now using Tylenol only.  He continues to use Enterogam. He is almost back to his regular regimen. He will have a bowel movement and 30-40 minutes later have liquid stools with some particulate matter. He had 3 bowel movements on Sunday.  He continues to take Revlimid every other day. He does not take iron regularly. He is taking potassium one a day. He is not taking calcium but plans to restart it.  He denies any bone pain.  He denies any infections.  Past Medical History  Diagnosis Date  . Hypertension   . Spinal stenosis   . GERD (gastroesophageal reflux disease)   . CHF (congestive heart failure) ([place])   . Arthritis   . Angina pectoris ([place])   . Hyperlipidemia   . Multiple myeloma ([place])   . Multiple myeloma ([place]) 03/27/2015  . Diabetes mellitus without complication [facility])     Patient takes Metformin.  . Sleep apnea     No CPAP @ present  . Shortness of breath dyspnea   . Anxiety   . Depression   . Coronary artery disease   . Diverticulosis   . Myocardial infarction [facility]) April 2001    widowmaker    Past Surgical History  Procedure Laterality Date  . Rotator cuff repair    . Coronary artery bypass graft    . Pilonidal cyst excision    . Total hip arthroplasty Right   . Cataract extraction    . Inguinal hernia repair    . Carpal tunnel  release    . Cardiac catheterization    . Joint replacement Right 2008    Right Total Hip Replacement  . Eye surgery Bilateral     Cataract Extraction  . Ventral hernia repair N/A 08/15/2015    Procedure: VENTRAL HERNIA REPAIR WITH MESH ;  Surgeon: Leonie Green, MD;  Location: [facility] ORS;  Service: General;  Laterality: N/A;    Family History  Problem Relation Age of Onset  . Heart disease Father   . Stroke Mother   . Prostate cancer Maternal Grandfather 4    Social History:  reports that he quit smoking about 15 years ago. His smoking use included Cigarettes. He has a 60 pack-year smoking history. He quit smokeless tobacco use about 15 years ago. He reports that he does not drink alcohol or use illicit drugs.  The patient is alone today.  Allergies:  Allergies  Allergen Reactions  . Pravachol [Pravastatin]   . Pravastatin Sodium Other (See Comments)  . Statins Other (See Comments)    Muscle aches  . Zocor [Simvastatin] Other (See Comments)    Muscle aches    Current Medications: Current Outpatient Prescriptions  Medication Sig Dispense Refill  . ALPRAZolam (XANAX) 0.25 MG tablet Take 0.25 mg by mouth at  bedtime as needed. for sleep  3  . amoxicillin (AMOXIL) 500 MG capsule One hour prior to dental appointment (4 capsules)  1  . aspirin 81 MG tablet Take 81 mg by mouth daily.     [name] Pollen 500 MG CHEW Chew 1 tablet by mouth daily.    . Calcium Carb-Cholecalciferol 600-200 MG-UNIT TABS Take 1 tablet by mouth daily.    . ergocalciferol (VITAMIN D2) 50000 UNITS capsule Take 50,000 Units by mouth once a week.    . Ferrous Sulfate (IRON) 325 (65 FE) MG TABS Take 1 tablet by mouth. 2 - 3 times  a week    . HYDROcodone-acetaminophen (NORCO) 5-325 MG tablet Take 1-2 tablets by mouth every 4 (four) hours as needed for moderate pain. 20 tablet 0  . lenalidomide (REVLIMID) 10 MG capsule Take 1 capsule (10 mg total) by mouth every other day. Auth # [id number] 14 capsule 0  .  lisinopril (PRINIVIL,ZESTRIL) 20 MG tablet Take 1 tablet by mouth daily.    Marland Kitchen lovastatin (MEVACOR) 40 MG tablet Take 40 mg by mouth at bedtime.    . metFORMIN (GLUCOPHAGE) 500 MG tablet Take 500 mg by mouth daily with breakfast.    . omeprazole (PRILOSEC) 20 MG capsule Take 20 mg by mouth daily.    . potassium chloride SA (K-DUR,KLOR-CON) 20 MEQ tablet Take 1 tablet (20 mEq total) by mouth daily. 30 tablet 2  . SBI/Protein Isolate (ENTERAGAM) 5 G PACK Take 1 packet by mouth daily. 30 each 2  . traMADol (ULTRAM) 50 MG tablet TAKE 1 TABLET BY MOUTH 3 TIMES A DAY AS NEEDED 60 tablet 0  . vitamin B-12 (CYANOCOBALAMIN) 1000 MCG tablet Take 1,000 mcg by mouth. 2-3 times a week    . zolendronic acid 4 mg in sodium chloride 0.9 % 100 mL Inject 4 mg into the vein every 30 (thirty) days.     No current facility-administered medications for this visit.    Review of Systems:  GENERAL: Feels "ok".  No fevers, sweats or weight loss. PERFORMANCE STATUS (ECOG): 1 HEENT: No visual changes, runny nose, sore throat, mouth sores or tenderness. Lungs: No shortness of breath or cough. No hemoptysis. Cardiac: No chest pain, palpitations, orthopnea, or PND.  Pre-op echo and stress test good. GI: Bowel movements fairly predictable on Enterogam.  No nausea, vomiting, diarrhea, constipation, melena or hematochezia. GU: No urgency, frequency, dysuria, or hematuria. Musculoskeletal: Spinal stenosis.No joint pain. No muscle tenderness. Extremities: No pain or swelling. Skin: No rashes or skin changes. Neuro: No headache, numbness or weakness, balance or coordination issues. Endocrine: Diabetes.  No thyroid issues, hot flashes or night sweats. Psych: No mood changes, depression or anxiety. Pain: No focal pain. Review of systems: All other systems reviewed and found to be negative.  Physical Exam: Blood pressure 138/68, pulse 65, temperature 98.7 F (37.1 C), temperature source Tympanic, resp. rate  18, height _0  (1.702 m), weight 202 lb 9.6 oz (91.9 kg).  GENERAL: Well developed, well nourished, sitting comfortably in the exam room in no acute distress.  He has a cane at his side.   MENTAL STATUS: Alert and oriented to person, place and time. HEAD: White hair. Male pattern baldness. Normocephalic, atraumatic, face symmetric, no Cushingoid features. EYES: Blue eyes. Pupils equal round and reactive to light and accomodation. No conjunctivitis or scleral icterus. ENT: Oropharynx clear without lesion. Tongue normal. Mucous membranes moist.  RESPIRATORY: Clear to auscultation without rales, wheezes or rhonchi. CARDIOVASCULAR: Regular rate and  rhythm without murmur, rub or gallop. ABDOMEN: Long horizontal incision in LLQ.  Soft, non-tender, with active bowel sounds, and no hepatosplenomegaly. No guarding or rebound tenderness.  No masses. SKIN: No rashes, ulcers or lesions. EXTREMITIES: No edema, no skin discoloration or tenderness. No palpable cords. LYMPH NODES: No palpable cervical, supraclavicular, axillary or inguinal adenopathy  NEUROLOGICAL: Unremarkable. PSYCH: Appropriate  Appointment on 09/01/2015  Component Date Value Ref Range Status  . WBC 09/01/2015 3.3* 3.8 - 10.6 K/uL Final  . RBC 09/01/2015 3.75* 4.40 - 5.90 MIL/uL Final  . Hemoglobin 09/01/2015 13.3  13.0 - 18.0 g/dL Final  . HCT 09/01/2015 38.2* 40.0 - 52.0 % Final  . MCV 09/01/2015 101.9* 80.0 - 100.0 fL Final  . MCH 09/01/2015 35.4* 26.0 - 34.0 pg Final  . MCHC 09/01/2015 34.7  32.0 - 36.0 g/dL Final  . RDW 09/01/2015 13.8  11.5 - 14.5 % Final  . Platelets 09/01/2015 236  150 - 440 K/uL Final  . Neutrophils Relative % 09/01/2015 47   Final  . Neutro Abs 09/01/2015 1.5  1.4 - 6.5 K/uL Final  . Lymphocytes Relative 09/01/2015 30   Final  . Lymphs Abs 09/01/2015 1.0  1.0 - 3.6 K/uL Final  . Monocytes Relative 09/01/2015 12   Final  . Monocytes Absolute 09/01/2015 0.4  0.2 - 1.0 K/uL Final  .  Eosinophils Relative 09/01/2015 9   Final  . Eosinophils Absolute 09/01/2015 0.3  0 - 0.7 K/uL Final  . Basophils Relative 09/01/2015 2   Final  . Basophils Absolute 09/01/2015 0.1  0 - 0.1 K/uL Final  . Sodium 09/01/2015 139  135 - 145 mmol/L Final  . Potassium 09/01/2015 3.8  3.5 - 5.1 mmol/L Final  . Chloride 09/01/2015 108  101 - 111 mmol/L Final  . CO2 09/01/2015 25  22 - 32 mmol/L Final  . Glucose, Bld 09/01/2015 146* 65 - 99 mg/dL Final  . BUN 09/01/2015 12  6 - 20 mg/dL Final  . Creatinine, Ser 09/01/2015 0.76  0.61 - 1.24 mg/dL Final  . Calcium 09/01/2015 8.2* 8.9 - 10.3 mg/dL Final  . Total Protein 09/01/2015 7.5  6.5 - 8.1 g/dL Final  . Albumin 09/01/2015 3.9  3.5 - 5.0 g/dL Final  . AST 09/01/2015 26  15 - 41 U/L Final  . ALT 09/01/2015 15* 17 - 63 U/L Final  . Alkaline Phosphatase 09/01/2015 48  38 - 126 U/L Final  . Total Bilirubin 09/01/2015 0.5  0.3 - 1.2 mg/dL Final  . GFR calc non Af Amer 09/01/2015 >60  >60 mL/min Final  . GFR calc Af Amer 09/01/2015 >60  >60 mL/min Final   Comment: (NOTE) The eGFR has been calculated using the CKD EPI equation. This calculation has not been validated in all clinical situations. eGFR's persistently <60 mL/min signify possible Chronic Kidney Disease.   . Anion gap 09/01/2015 6  5 - 15 Final  . Ferritin 09/01/2015 376* 24 - 336 ng/mL Final  . Iron 09/01/2015 139  45 - 182 ug/dL Final  . TIBC 09/01/2015 299  250 - 450 ug/dL Final  . Saturation Ratios 09/01/2015 46* 17.9 - 39.5 % Final  . UIBC 09/01/2015 160   Final    Assessment:  Luis [name]. is a 69 y.o. male with stage III multiple myeloma. He presented in 02/2013 with left-sided sharp pain. Evaluation revealed wide-spread lytic lesions including fracture of the left 5th rib laterally. CT scans on 02/22/2013 showed innumerable lytic lesions in  thoracic spine, sternum, clavicle, scapula, and ribs.   Bone marrow biopsy on 03/13/2013 revealed 15 to 20% plasma cells. Iron  stores were absent. Renal function was normal. SPEP on 03/01/2013 revealed a 1.4 gm/dL IgG monoclonal lambda. IgG was 1987.   He began induction with Revlimid 25 mg a day (3 weeks on and 1 week off) and Decadron (40 mg once a week) on 04/09/2013. SPEP has revealed no monoclonal protein since 07/26/2014 (last check 07/29/2015). IgG was 1362 on 07/26/2014 and 1097 on 12/19/2014. Light chains have been monitored: lambda free light chains 33.20 (ratio of 1.56) on 09/20/2014, 31.51 (ratio of 1.43) on 12/19/2014, and 30.25 (ratio of 1.56) on 05/01/2015.  With remission, Revlimid was decreased to 10 mg a day then to 10 mg every other day secondary to issues with renal function and diarrhea.   Bone survey on 05/30/2015 revealed the majority of small lytic lesions were stable.  There was possibly new lesion in the distal left clavicle and right mid femur (small) and a possible developing lucency in the proximal left femoral shaft.  The calvarial lesion noted on the prior study was not well seen (? positional).   He receives Zometa every 3 months (last 06/26/2015). He takes calcium 600 mg BID. He takes iron periodically. He receives B12 monthly (last 07/29/2015). B12 was 370 on 01/17/2015.  Symptomatically, he is has had a difficult time since his hernia surgery on 08/15/2015.  Bowel movements are predictable on Enterogam. Exam is stable.  Ferritin is 376.  Plan: 1.  Labs today:  CBC with diff, CMP, SPEP, ferritin, iron studies. 2.  B12 today. 3.  Discontinue ferrous sulfate 4.  Follow-up bone survey in 6 months (11/30/2015). 5.  RTC in 1 month for MD assessment and labs (CBC with diff, CMP, SPEP), B12 and Zometa.   Lequita Asal, MD  09/01/2015, 3:27 PM

## 2015-09-02 LAB — PROTEIN ELECTROPHORESIS, SERUM
A/G Ratio: 1 (ref 0.7–1.7)
Albumin ELP: 3.5 g/dL (ref 2.9–4.4)
Alpha-1-Globulin: 0.2 g/dL (ref 0.0–0.4)
Alpha-2-Globulin: 0.9 g/dL (ref 0.4–1.0)
Beta Globulin: 1.2 g/dL (ref 0.7–1.3)
Gamma Globulin: 1.1 g/dL (ref 0.4–1.8)
Globulin, Total: 3.5 g/dL (ref 2.2–3.9)
Total Protein ELP: 7 g/dL (ref 6.0–8.5)

## 2015-09-23 ENCOUNTER — Other Ambulatory Visit: Payer: Self-pay

## 2015-09-24 ENCOUNTER — Encounter: Payer: Self-pay | Admitting: Hematology and Oncology

## 2015-09-24 NOTE — Progress Notes (Signed)
[facility] day:  05/01/2015  Chief Complaint: Luis Wambold. is a 69 y.o. male with multiple myeloma on Revlimid who is seen for 1 month assessment prior to monthly Zometa.  HPI: The patient was last seen in the medical oncology clinic on 03/31/2015.  At that time, he was had on going issues with diarrhea.  Prior stool studies were normal.  We discussed a trial of Enterogam.  He was to continue his Revlimid 10 mg every other day.  He received Zometa on 03/31/2015.  Symptomatically, he states that his diarrhea is a little better with Enterogam. Some days he has diarrhea twice a day. He has had none today. He notes more control. He now describes his bowel movements as loose, small pieces, but not liquid.  He is taking Enterogam twice a day. He has 3 samples left. He continues his Revlimid.  Past Medical History  Diagnosis Date  . Hypertension   . Spinal stenosis   . GERD (gastroesophageal reflux disease)   . CHF (congestive heart failure) ([place])   . Arthritis   . Angina pectoris ([place])   . Hyperlipidemia   . Multiple myeloma ([place])   . Multiple myeloma ([place]) 03/27/2015  . Diabetes mellitus without complication [facility])     Patient takes Metformin.  . Sleep apnea     No CPAP @ present  . Shortness of breath dyspnea   . Anxiety   . Depression   . Coronary artery disease   . Diverticulosis   . Myocardial infarction [facility]) April 2001    widowmaker    Past Surgical History  Procedure Laterality Date  . Rotator cuff repair    . Coronary artery bypass graft    . Pilonidal cyst excision    . Total hip arthroplasty Right   . Cataract extraction    . Inguinal hernia repair    . Carpal tunnel release    . Cardiac catheterization    . Joint replacement Right 2008    Right Total Hip Replacement  . Eye surgery Bilateral     Cataract Extraction  . Ventral hernia repair N/A 08/15/2015    Procedure: VENTRAL HERNIA REPAIR WITH MESH ;  Surgeon:  Leonie Green, MD;  Location: [facility] ORS;  Service: General;  Laterality: N/A;    Family History  Problem Relation Age of Onset  . Heart disease Father   . Stroke Mother   . Prostate cancer Maternal Grandfather 68    Social History:  reports that he quit smoking about 15 years ago. His smoking use included Cigarettes. He has a 60 pack-year smoking history. He quit smokeless tobacco use about 15 years ago. He reports that he does not drink alcohol or use illicit drugs.  The patient is alone today.  Allergies:  Allergies  Allergen Reactions  . Pravachol [Pravastatin]   . Pravastatin Sodium Other (See Comments)  . Statins Other (See Comments)    Muscle aches  . Zocor [Simvastatin] Other (See Comments)    Muscle aches    Current Medications: Current Outpatient Prescriptions  Medication Sig Dispense Refill  . ALPRAZolam (XANAX) 0.25 MG tablet Take 0.25 mg by mouth at bedtime as needed. for sleep  3  . amoxicillin (AMOXIL) 500 MG capsule One hour prior to dental appointment (4 capsules)  1  . aspirin 81 MG tablet Take 81 mg by mouth daily.     [name] Pollen 500 MG CHEW Chew 1 tablet by  mouth daily.    . Calcium Carb-Cholecalciferol 600-200 MG-UNIT TABS Take 1 tablet by mouth daily.    . Ferrous Sulfate (IRON) 325 (65 FE) MG TABS Take 1 tablet by mouth. 2 - 3 times  a week    . metFORMIN (GLUCOPHAGE) 500 MG tablet Take 500 mg by mouth daily with breakfast.    . omeprazole (PRILOSEC) 20 MG capsule Take 20 mg by mouth daily.    Marland Kitchen zolendronic acid 4 mg in sodium chloride 0.9 % 100 mL Inject 4 mg into the vein every 30 (thirty) days.    . ergocalciferol (VITAMIN D2) 50000 UNITS capsule Take 50,000 Units by mouth once a week.    Marland Kitchen HYDROcodone-acetaminophen (NORCO) 5-325 MG tablet Take 1-2 tablets by mouth every 4 (four) hours as needed for moderate pain. 20 tablet 0  . lenalidomide (REVLIMID) 10 MG capsule Take 1 capsule (10 mg total) by mouth every other day. Auth # [id number] 14 capsule 0   . lisinopril (PRINIVIL,ZESTRIL) 20 MG tablet Take 1 tablet by mouth daily.    Marland Kitchen lovastatin (MEVACOR) 40 MG tablet Take 40 mg by mouth at bedtime.    . potassium chloride SA (K-DUR,KLOR-CON) 20 MEQ tablet Take 1 tablet (20 mEq total) by mouth daily. 30 tablet 2  . SBI/Protein Isolate (ENTERAGAM) 5 G PACK Take 1 packet by mouth daily. 30 each 2  . traMADol (ULTRAM) 50 MG tablet TAKE 1 TABLET BY MOUTH 3 TIMES A DAY AS NEEDED 60 tablet 0  . vitamin B-12 (CYANOCOBALAMIN) 1000 MCG tablet Take 1,000 mcg by mouth. 2-3 times a week     No current facility-administered medications for this visit.    Review of Systems:  GENERAL:  Overall better.  No fevers, sweats or weight loss. PERFORMANCE STATUS (ECOG):  1 HEENT:  No visual changes, runny nose, sore throat, mouth sores or tenderness. Lungs: No shortness of breath or cough.  No hemoptysis. Cardiac:  No chest pain, palpitations, orthopnea, or PND. GI:  Diarrhea, improved with Enterogam.  No nausea, vomiting, constipation, melena or hematochezia. GU:  No urgency, frequency, dysuria, or hematuria. Musculoskeletal:  Arthritis. No back pain.  Muscles sore. Extremities:  No pain or swelling. Skin:  No rashes or skin changes. Neuro:  No headache, numbness or weakness, balance or coordination issues. Endocrine:  No diabetes, thyroid issues, hot flashes or night sweats. Psych:  No mood changes or anxiety. Pain:  No focal pain. Review of systems:  All other systems reviewed and found to be negative.   Physical Exam: Blood pressure 164/84, pulse 64, temperature 96.5 F (35.8 C), temperature source Tympanic, resp. rate 18, height '5\' 7"'  (1.702 m), weight 200 lb 13.4 oz (91.1 kg). GENERAL:  Well developed, well nourished, sitting comfortably in the exam room in no acute distress.  He has a cane at his side. MENTAL STATUS:  Alert and oriented to person, place and time. HEAD:  Pearline Cables hair.  Male pattern baldness.  Normocephalic, atraumatic, face symmetric, no  Cushingoid features. EYES:  Blue eyes.  Pupils equal round and reactive to light and accomodation.  No conjunctivitis or scleral icterus. ENT:  Oropharynx clear without lesion.  Tongue normal. Mucous membranes moist.  RESPIRATORY:  Clear to auscultation without rales, wheezes or rhonchi. CARDIOVASCULAR:  Regular rate and rhythm without murmur, rub or gallop. ABDOMEN:  Soft, non-tender, with active bowel sounds, and no hepatosplenomegaly.  No masses. SKIN:  No rashes, ulcers or lesions. EXTREMITIES: No edema, no skin discoloration or tenderness.  No  palpable cords. LYMPH NODES: No palpable cervical, supraclavicular, axillary or inguinal adenopathy  NEUROLOGICAL: Unremarkable. PSYCH:  Appropriate.   Appointment on 05/01/2015  Component Date Value Ref Range Status  . WBC 05/01/2015 3.3* 3.8 - 10.6 K/uL Final  . RBC 05/01/2015 3.74* 4.40 - 5.90 MIL/uL Final  . Hemoglobin 05/01/2015 13.2  13.0 - 18.0 g/dL Final  . HCT 05/01/2015 38.8* 40.0 - 52.0 % Final  . MCV 05/01/2015 103.7* 80.0 - 100.0 fL Final  . MCH 05/01/2015 35.3* 26.0 - 34.0 pg Final  . MCHC 05/01/2015 34.0  32.0 - 36.0 g/dL Final  . RDW 05/01/2015 13.8  11.5 - 14.5 % Final  . Platelets 05/01/2015 190  150 - 440 K/uL Final  . Neutrophils Relative % 05/01/2015 40   Final  . Neutro Abs 05/01/2015 1.3* 1.4 - 6.5 K/uL Final  . Lymphocytes Relative 05/01/2015 32   Final  . Lymphs Abs 05/01/2015 1.1  1.0 - 3.6 K/uL Final  . Monocytes Relative 05/01/2015 15   Final  . Monocytes Absolute 05/01/2015 0.5  0.2 - 1.0 K/uL Final  . Eosinophils Relative 05/01/2015 10   Final  . Eosinophils Absolute 05/01/2015 0.3  0 - 0.7 K/uL Final  . Basophils Relative 05/01/2015 3   Final  . Basophils Absolute 05/01/2015 0.1  0 - 0.1 K/uL Final  . Sodium 05/01/2015 138  135 - 145 mmol/L Final  . Potassium 05/01/2015 3.6  3.5 - 5.1 mmol/L Final  . Chloride 05/01/2015 107  101 - 111 mmol/L Final  . CO2 05/01/2015 24  22 - 32 mmol/L Final  . Glucose, Bld  05/01/2015 158* 65 - 99 mg/dL Final  . BUN 05/01/2015 14  6 - 20 mg/dL Final  . Creatinine, Ser 05/01/2015 0.72  0.61 - 1.24 mg/dL Final  . Calcium 05/01/2015 8.5* 8.9 - 10.3 mg/dL Final  . Total Protein 05/01/2015 7.7  6.5 - 8.1 g/dL Final  . Albumin 05/01/2015 3.7  3.5 - 5.0 g/dL Final  . AST 05/01/2015 27  15 - 41 U/L Final  . ALT 05/01/2015 18  17 - 63 U/L Final  . Alkaline Phosphatase 05/01/2015 46  38 - 126 U/L Final  . Total Bilirubin 05/01/2015 0.6  0.3 - 1.2 mg/dL Final  . GFR calc non Af Amer 05/01/2015 >60  >60 mL/min Final  . GFR calc Af Amer 05/01/2015 >60  >60 mL/min Final   Comment: (NOTE) The eGFR has been calculated using the CKD EPI equation. This calculation has not been validated in all clinical situations. eGFR's persistently <60 mL/min signify possible Chronic Kidney Disease.   . Anion gap 05/01/2015 7  5 - 15 Final  . Kappa free light chain 05/01/2015 47.07* 3.30 - 19.40 mg/L Final  . Lamda free light chains 05/01/2015 30.25* 5.71 - 26.30 mg/L Final  . Kappa, lamda light chain ratio 05/01/2015 1.56  0.26 - 1.65 Final   Comment: (NOTE) Performed At: [facility] [place], [place] [id number] Lindon Romp MD YN:[id number]     Assessment:  Luis Klopf. is a 69 y.o. male with stage III multiple myeloma. He presented in 02/2013 with left-sided sharp pain. Evaluation revealed wide-spread lytic lesions including fracture of the left 5th rib laterally. CT scans on 02/22/2013 showed innumerable lytic lesions in thoracic spine, sternum, clavicle, scapula, and ribs.   Bone marrow biopsy on 03/13/2013 revealed 15 to 20% plasma cells. Iron stores were absent. Renal function was normal. SPEP on 03/01/2013 revealed a  1.4 gm/dL IgG monoclonal lambda. IgG was 1987.   He began induction with Revlimid 25 mg a day (3 weeks on and 1 week off) and Decadron (40 mg once a week) on 04/09/2013. SPEP has revealed no monoclonal protein since  07/26/2014 (last check 03/31/2015). IgG was 1362 on 07/26/2014 and 1097 on 12/19/2014. Light chains have been monitored: lambda free light chains 33.20 (ratio of 1.56) on 09/20/2014, 31.51 (ratio of 1.43) on 12/19/2014, and 30.25 (ratio of 1.56) on 05/01/2015.  With remission, the patient's Revlimid has been decreased to 10 mg a day then to 10 mg every other day (several months ago) secondary to issues with renal function and diarrhea.   He receives Zometa monthly (last 03/31/2015). He takes calcium. He has macrocytic RBC indices.  B12 was 370, folate 15.4, and TSH 1.713  on 01/17/2015.  Symptomatically, he is faigued. He has diarrhea which appears unrelated to low dose Revlimid. Counts are good.  Calcium is slightly low.  Plan: 1. Labs today:  CBC with diff, CMP, free light chains. 2. B12 today.  No Zometa. 3. Re-schedule bone survey- f/u multiple myeloma. 4. Refill Revlimid. 5. Continue Enterogam.  Rx provided.  Samples. 6. RTC in 1 month for MD assess, labs (CBC, CMP), and B12.   Lequita Asal, MD  05/01/2015

## 2015-10-06 ENCOUNTER — Inpatient Hospital Stay: Payer: BLUE CROSS/BLUE SHIELD | Attending: Hematology and Oncology | Admitting: Hematology and Oncology

## 2015-10-06 ENCOUNTER — Inpatient Hospital Stay: Payer: BLUE CROSS/BLUE SHIELD

## 2015-10-06 VITALS — BP 144/75 | HR 63 | Temp 96.0°F | Resp 18 | Ht 67.0 in | Wt 203.7 lb

## 2015-10-06 DIAGNOSIS — Z87891 Personal history of nicotine dependence: Secondary | ICD-10-CM | POA: Diagnosis not present

## 2015-10-06 DIAGNOSIS — E119 Type 2 diabetes mellitus without complications: Secondary | ICD-10-CM | POA: Diagnosis not present

## 2015-10-06 DIAGNOSIS — Z8042 Family history of malignant neoplasm of prostate: Secondary | ICD-10-CM

## 2015-10-06 DIAGNOSIS — I509 Heart failure, unspecified: Secondary | ICD-10-CM

## 2015-10-06 DIAGNOSIS — K219 Gastro-esophageal reflux disease without esophagitis: Secondary | ICD-10-CM

## 2015-10-06 DIAGNOSIS — C9000 Multiple myeloma not having achieved remission: Secondary | ICD-10-CM

## 2015-10-06 DIAGNOSIS — K5730 Diverticulosis of large intestine without perforation or abscess without bleeding: Secondary | ICD-10-CM

## 2015-10-06 DIAGNOSIS — F329 Major depressive disorder, single episode, unspecified: Secondary | ICD-10-CM

## 2015-10-06 DIAGNOSIS — E538 Deficiency of other specified B group vitamins: Secondary | ICD-10-CM | POA: Diagnosis not present

## 2015-10-06 DIAGNOSIS — F419 Anxiety disorder, unspecified: Secondary | ICD-10-CM

## 2015-10-06 DIAGNOSIS — I10 Essential (primary) hypertension: Secondary | ICD-10-CM

## 2015-10-06 DIAGNOSIS — M4800 Spinal stenosis, site unspecified: Secondary | ICD-10-CM | POA: Diagnosis not present

## 2015-10-06 DIAGNOSIS — Z7982 Long term (current) use of aspirin: Secondary | ICD-10-CM

## 2015-10-06 DIAGNOSIS — M129 Arthropathy, unspecified: Secondary | ICD-10-CM

## 2015-10-06 DIAGNOSIS — K469 Unspecified abdominal hernia without obstruction or gangrene: Secondary | ICD-10-CM

## 2015-10-06 DIAGNOSIS — Z79899 Other long term (current) drug therapy: Secondary | ICD-10-CM

## 2015-10-06 DIAGNOSIS — E785 Hyperlipidemia, unspecified: Secondary | ICD-10-CM | POA: Diagnosis not present

## 2015-10-06 DIAGNOSIS — I252 Old myocardial infarction: Secondary | ICD-10-CM | POA: Diagnosis not present

## 2015-10-06 DIAGNOSIS — I2510 Atherosclerotic heart disease of native coronary artery without angina pectoris: Secondary | ICD-10-CM

## 2015-10-06 DIAGNOSIS — R197 Diarrhea, unspecified: Secondary | ICD-10-CM

## 2015-10-06 LAB — CBC WITH DIFFERENTIAL/PLATELET
Basophils Absolute: 0.1 10*3/uL (ref 0–0.1)
Basophils Relative: 3 %
Eosinophils Absolute: 0.4 10*3/uL (ref 0–0.7)
Eosinophils Relative: 10 %
HCT: 39.4 % — ABNORMAL LOW (ref 40.0–52.0)
Hemoglobin: 13.6 g/dL (ref 13.0–18.0)
Lymphocytes Relative: 30 %
Lymphs Abs: 1.2 10*3/uL (ref 1.0–3.6)
MCH: 35.3 pg — ABNORMAL HIGH (ref 26.0–34.0)
MCHC: 34.4 g/dL (ref 32.0–36.0)
MCV: 102.4 fL — ABNORMAL HIGH (ref 80.0–100.0)
Monocytes Absolute: 0.5 10*3/uL (ref 0.2–1.0)
Monocytes Relative: 14 %
Neutro Abs: 1.7 10*3/uL (ref 1.4–6.5)
Neutrophils Relative %: 43 %
Platelets: 183 10*3/uL (ref 150–440)
RBC: 3.85 MIL/uL — ABNORMAL LOW (ref 4.40–5.90)
RDW: 13.6 % (ref 11.5–14.5)
WBC: 3.9 10*3/uL (ref 3.8–10.6)

## 2015-10-06 LAB — COMPREHENSIVE METABOLIC PANEL
ALT: 38 U/L (ref 17–63)
AST: 44 U/L — ABNORMAL HIGH (ref 15–41)
Albumin: 4.1 g/dL (ref 3.5–5.0)
Alkaline Phosphatase: 59 U/L (ref 38–126)
Anion gap: 11 (ref 5–15)
BUN: 16 mg/dL (ref 6–20)
CO2: 22 mmol/L (ref 22–32)
Calcium: 9.2 mg/dL (ref 8.9–10.3)
Chloride: 104 mmol/L (ref 101–111)
Creatinine, Ser: 0.84 mg/dL (ref 0.61–1.24)
GFR calc Af Amer: >60 mL/min (ref >60)
GFR calc non Af Amer: >60 mL/min (ref >60)
Glucose, Bld: 103 mg/dL — ABNORMAL HIGH (ref 65–99)
Potassium: 4.3 mmol/L (ref 3.5–5.1)
Sodium: 137 mmol/L (ref 135–145)
Total Bilirubin: 0.8 mg/dL (ref 0.3–1.2)
Total Protein: 8.1 g/dL (ref 6.5–8.1)

## 2015-10-06 LAB — MAGNESIUM: Magnesium: 1.9 mg/dL (ref 1.7–2.4)

## 2015-10-06 MED ORDER — CYANOCOBALAMIN 1000 MCG/ML IJ SOLN
1000.0000 ug | Freq: Once | INTRAMUSCULAR | Status: AC
Start: 2015-10-06 — End: 2015-10-06
  Administered 2015-10-06: 1000 ug via INTRAMUSCULAR
  Filled 2015-10-06: qty 1

## 2015-10-06 MED ORDER — ZOLEDRONIC ACID 4 MG/100ML IV SOLN
4.0000 mg | Freq: Once | INTRAVENOUS | Status: AC
  Administered 2015-10-06: 4 mg via INTRAVENOUS
  Filled 2015-10-06: qty 100

## 2015-10-06 MED ORDER — ZOLEDRONIC ACID 4 MG/5ML IV CONC: 4.0000 mg | Freq: Once | INTRAVENOUS | Status: DC

## 2015-10-06 MED ORDER — SODIUM CHLORIDE 0.9 % IV SOLN
Freq: Once | INTRAVENOUS | Status: AC
  Administered 2015-10-06: 15:00:00 via INTRAVENOUS
  Filled 2015-10-06: qty 1000

## 2015-10-06 NOTE — Progress Notes (Signed)
[facility] day:  10/06/2015   Chief Complaint: Luis Holan. is a 69 y.o. male with multiple myeloma on Revlimid who is seen for assessment on Revlimid prior to Zometa and monthly B12.  HPI: The patient was last seen in the medical oncology clinic on 09/01/2015.  At that time, he was slowly recovering from hernia surgery on 08/15/2015.  Bowel movements were predictable on Enterogam.  He was tolerating his Revlimid.  SPEP revealed no monoclonal protein.  He received B12.  During the interim, had some issues with diarrhea. He continues to take Enterogam. Approximately 45 minutes after eating, he has diarrhea. He notes that every second or third day it is "bad". He will have constipation and then loose stools. He has a colonoscopy scheduled for after the first of the year with Dr. Gustavo Lah.   He underwent abdominal and pelvic CT scan on 07/15/2015. There was extensive sigmoid diverticulosis. There was questionable wall thickening of the ascending colon versus artifact from incomplete distention. He has Spigelian hernias.  There were numerous lytic lesions consistent with myeloma  Past Medical History  Diagnosis Date  . Hypertension   . Spinal stenosis   . GERD (gastroesophageal reflux disease)   . CHF (congestive heart failure) ([place])   . Arthritis   . Angina pectoris ([place])   . Hyperlipidemia   . Multiple myeloma ([place])   . Multiple myeloma ([place]) 03/27/2015  . Diabetes mellitus without complication [facility])     Patient takes Metformin.  . Sleep apnea     No CPAP @ present  . Shortness of breath dyspnea   . Anxiety   . Depression   . Coronary artery disease   . Diverticulosis   . Myocardial infarction [facility]) April 2001    widowmaker    Past Surgical History  Procedure Laterality Date  . Rotator cuff repair    . Coronary artery bypass graft    . Pilonidal cyst excision    . Total hip arthroplasty Right   . Cataract extraction    .  Inguinal hernia repair    . Carpal tunnel release    . Cardiac catheterization    . Joint replacement Right 2008    Right Total Hip Replacement  . Eye surgery Bilateral     Cataract Extraction  . Ventral hernia repair N/A 08/15/2015    Procedure: VENTRAL HERNIA REPAIR WITH MESH ;  Surgeon: Leonie Green, MD;  Location: [facility] ORS;  Service: General;  Laterality: N/A;    Family History  Problem Relation Age of Onset  . Heart disease Father   . Stroke Mother   . Prostate cancer Maternal Grandfather 40    Social History:  reports that he quit smoking about 15 years ago. His smoking use included Cigarettes. He has a 60 pack-year smoking history. He quit smokeless tobacco use about 15 years ago. He reports that he does not drink alcohol or use illicit drugs.  The patient is alone today.  Allergies:  Allergies  Allergen Reactions  . Pravachol [Pravastatin]   . Pravastatin Sodium Other (See Comments)  . Statins Other (See Comments)    Muscle aches  . Zocor [Simvastatin] Other (See Comments)    Muscle aches    Current Medications: Current Outpatient Prescriptions  Medication Sig Dispense Refill  . ALPRAZolam (XANAX) 0.25 MG tablet Take 0.25 mg by mouth at bedtime as needed. for sleep  3  . amoxicillin (AMOXIL) 500 MG capsule One  hour prior to dental appointment (4 capsules)  1  . aspirin 81 MG tablet Take 81 mg by mouth daily.     [name] Pollen 500 MG CHEW Chew 1 tablet by mouth daily.    . Calcium Carb-Cholecalciferol 600-200 MG-UNIT TABS Take 1 tablet by mouth daily.    . ergocalciferol (VITAMIN D2) 50000 UNITS capsule Take 50,000 Units by mouth once a week.    Marland Kitchen HYDROcodone-acetaminophen (NORCO) 5-325 MG tablet Take 1-2 tablets by mouth every 4 (four) hours as needed for moderate pain. 20 tablet 0  . lenalidomide (REVLIMID) 10 MG capsule Take 1 capsule (10 mg total) by mouth every other day. Auth # [id number] 14 capsule 0  . lisinopril (PRINIVIL,ZESTRIL) 20 MG tablet Take 1 tablet  by mouth daily.    Marland Kitchen lovastatin (MEVACOR) 40 MG tablet Take 40 mg by mouth at bedtime.    . metFORMIN (GLUCOPHAGE) 500 MG tablet Take 500 mg by mouth daily with breakfast.    . omeprazole (PRILOSEC) 20 MG capsule Take 20 mg by mouth daily.    . potassium chloride SA (K-DUR,KLOR-CON) 20 MEQ tablet Take 1 tablet (20 mEq total) by mouth daily. 30 tablet 2  . SBI/Protein Isolate (ENTERAGAM) 5 G PACK Take 1 packet by mouth daily. 30 each 2  . traMADol (ULTRAM) 50 MG tablet TAKE 1 TABLET BY MOUTH 3 TIMES A DAY AS NEEDED 60 tablet 0  . vitamin B-12 (CYANOCOBALAMIN) 1000 MCG tablet Take 1,000 mcg by mouth. 2-3 times a week    . zolendronic acid 4 mg in sodium chloride 0.9 % 100 mL Inject 4 mg into the vein every 30 (thirty) days.     No current facility-administered medications for this visit.    Review of Systems:  GENERAL: Tired. No fevers, sweats or weight loss. PERFORMANCE STATUS (ECOG): 1 HEENT: No visual changes, runny nose, sore throat, mouth sores or tenderness. Lungs: No shortness of breath or cough. No hemoptysis. Cardiac: No chest pain, palpitations, orthopnea, or PND.  Pre-op echo and stress test good. GI: Diarrhea worse every 2nd or 3rd day. No nausea, vomiting, constipation, melena or hematochezia. GU: No urgency, frequency, dysuria, or hematuria. Musculoskeletal: Spinal stenosis. No joint pain. No muscle tenderness. Extremities: No pain or swelling. Skin: No rashes or skin changes. Neuro: No headache, numbness or weakness, balance or coordination issues. Endocrine: Diabetes.  No tthyroid issues, hot flashes or night sweats. Psych: No mood changes, depression or anxiety. Pain: No focal pain. Review of systems: All other systems reviewed and found to be negative.  Physical Exam: Blood pressure 144/75, pulse 63, temperature 96 F (35.6 C), temperature source Tympanic, resp. rate 18, height '5\' 7"'  (1.702 m), weight 203 lb 11.3 oz (92.4 kg).  GENERAL: Well  developed, well nourished, sitting comfortably in the exam room in no acute distress. MENTAL STATUS: Alert and oriented to person, place and time. HEAD: Pearline Cables hair. Male pattern baldness. Normocephalic, atraumatic, face symmetric, no Cushingoid features. EYES: Blue eyes. Pupils equal round and reactive to light and accomodation. No conjunctivitis or scleral icterus. ENT: Oropharynx clear without lesion. Tongue normal. Mucous membranes moist.  RESPIRATORY: Clear to auscultation without rales, wheezes or rhonchi. CARDIOVASCULAR: Regular rate and rhythm without murmur, rub or gallop. ABDOMEN: Soft, non-tender, with active bowel sounds, and no hepatosplenomegaly. No masses. SKIN: No rashes, ulcers or lesions. EXTREMITIES: No edema, no skin discoloration or tenderness. No palpable cords. LYMPH NODES: No palpable cervical, supraclavicular, axillary or inguinal adenopathy  NEUROLOGICAL: Unremarkable. PSYCH: Appropriate  Appointment on 10/06/2015  Component Date Value Ref Range Status  . WBC 10/06/2015 3.9  3.8 - 10.6 K/uL Final  . RBC 10/06/2015 3.85* 4.40 - 5.90 MIL/uL Final  . Hemoglobin 10/06/2015 13.6  13.0 - 18.0 g/dL Final  . HCT 10/06/2015 39.4* 40.0 - 52.0 % Final  . MCV 10/06/2015 102.4* 80.0 - 100.0 fL Final  . MCH 10/06/2015 35.3* 26.0 - 34.0 pg Final  . MCHC 10/06/2015 34.4  32.0 - 36.0 g/dL Final  . RDW 10/06/2015 13.6  11.5 - 14.5 % Final  . Platelets 10/06/2015 183  150 - 440 K/uL Final  . Neutrophils Relative % 10/06/2015 43   Final  . Neutro Abs 10/06/2015 1.7  1.4 - 6.5 K/uL Final  . Lymphocytes Relative 10/06/2015 30   Final  . Lymphs Abs 10/06/2015 1.2  1.0 - 3.6 K/uL Final  . Monocytes Relative 10/06/2015 14   Final  . Monocytes Absolute 10/06/2015 0.5  0.2 - 1.0 K/uL Final  . Eosinophils Relative 10/06/2015 10   Final  . Eosinophils Absolute 10/06/2015 0.4  0 - 0.7 K/uL Final  . Basophils Relative 10/06/2015 3   Final  . Basophils Absolute 10/06/2015  0.1  0 - 0.1 K/uL Final  . Sodium 10/06/2015 137  135 - 145 mmol/L Final  . Potassium 10/06/2015 4.3  3.5 - 5.1 mmol/L Final  . Chloride 10/06/2015 104  101 - 111 mmol/L Final  . CO2 10/06/2015 22  22 - 32 mmol/L Final  . Glucose, Bld 10/06/2015 103* 65 - 99 mg/dL Final  . BUN 10/06/2015 16  6 - 20 mg/dL Final  . Creatinine, Ser 10/06/2015 0.84  0.61 - 1.24 mg/dL Final  . Calcium 10/06/2015 9.2  8.9 - 10.3 mg/dL Final  . Total Protein 10/06/2015 8.1  6.5 - 8.1 g/dL Final  . Albumin 10/06/2015 4.1  3.5 - 5.0 g/dL Final  . AST 10/06/2015 44* 15 - 41 U/L Final  . ALT 10/06/2015 38  17 - 63 U/L Final  . Alkaline Phosphatase 10/06/2015 59  38 - 126 U/L Final  . Total Bilirubin 10/06/2015 0.8  0.3 - 1.2 mg/dL Final  . GFR calc non Af Amer 10/06/2015 >60  >60 mL/min Final  . GFR calc Af Amer 10/06/2015 >60  >60 mL/min Final   Comment: (NOTE) The eGFR has been calculated using the CKD EPI equation. This calculation has not been validated in all clinical situations. eGFR's persistently <60 mL/min signify possible Chronic Kidney Disease.   . Anion gap 10/06/2015 11  5 - 15 Final  . Magnesium 10/06/2015 1.9  1.7 - 2.4 mg/dL Final    Assessment:  Luis [name]. is a 69 y.o. male with stage III multiple myeloma. He presented in 02/2013 with left-sided sharp pain. Evaluation revealed wide-spread lytic lesions including fracture of the left 5th rib laterally. CT scans on 02/22/2013 showed innumerable lytic lesions in thoracic spine, sternum, clavicle, scapula, and ribs.   Bone marrow biopsy on 03/13/2013 revealed 15 to 20% plasma cells. Iron stores were absent. Renal function was normal. SPEP on 03/01/2013 revealed a 1.4 gm/dL IgG monoclonal lambda. IgG was 1987.   He began induction with Revlimid 25 mg a day (3 weeks on and 1 week off) and Decadron (40 mg once a week) on 04/09/2013. SPEP has revealed no monoclonal protein since 07/26/2014 (last check 09/01/2015). IgG was 1362 on  07/26/2014 and 1097 on 12/19/2014. Light chains have been monitored: lambda free light chains 33.20 (ratio of  1.56) on 09/20/2014, 31.51 (ratio of 1.43) on 12/19/2014, and 30.25 (ratio of 1.56) on 05/01/2015.  With remission, Revlimid was decreased to 10 mg a day then to 10 mg every other day secondary to issues with renal function and diarrhea.   Bone survey on 05/30/2015 revealed the majority of small lytic lesions were stable.  There was possibly new lesion in the distal left clavicle and right mid femur (small) and a possible developing lucency in the proximal left femoral shaft.  The calvarial lesion noted on the prior study was not well seen (? positional).   He receives Zometa every 3 months (last 06/26/2015). He takes calcium 600 mg BID. He receives B12 monthly (last 09/01/2015). B12 was 370 on 01/17/2015.  He struggles with diarrhea.  He remains on Enterogam.  Abdominal and pelvic CT scan on 07/15/2015 revealed  extensive sigmoid diverticulosis. There was questionable wall thickening of the ascending colon versus artifact from incomplete distention.  He is scheduled for colonoscopy on 11/2015.  Plan: 1.  Labs today:  CBC with diff, CMP. 2.  Zometa today. 3.  B12 today. 4.  Schedule bone survey on 11/30/2015. 5.  RTC in 1 month for labs (CBC with diff, CMP), and B12. 6.  RTC in 2 months for MD assess, labs (CBC with diff, CMP, SPEP), review of bone survey, and B12.   Lequita Asal, MD  10/06/2015, 2:41 PM

## 2015-10-08 LAB — PROTEIN ELECTROPHORESIS, SERUM
A/G Ratio: 1.1 (ref 0.7–1.7)
Albumin ELP: 3.8 g/dL (ref 2.9–4.4)
Alpha-1-Globulin: 0.2 g/dL (ref 0.0–0.4)
Alpha-2-Globulin: 0.9 g/dL (ref 0.4–1.0)
Beta Globulin: 1.2 g/dL (ref 0.7–1.3)
Gamma Globulin: 1.3 g/dL (ref 0.4–1.8)
Globulin, Total: 3.6 g/dL (ref 2.2–3.9)
Total Protein ELP: 7.4 g/dL (ref 6.0–8.5)

## 2015-10-30 ENCOUNTER — Other Ambulatory Visit: Payer: Self-pay | Admitting: Hematology and Oncology

## 2015-10-31 ENCOUNTER — Other Ambulatory Visit: Payer: Self-pay | Admitting: Hematology and Oncology

## 2015-11-01 ENCOUNTER — Other Ambulatory Visit: Payer: Self-pay | Admitting: Hematology and Oncology

## 2015-11-03 ENCOUNTER — Other Ambulatory Visit: Payer: Self-pay | Admitting: Hematology and Oncology

## 2015-11-04 ENCOUNTER — Inpatient Hospital Stay: Payer: BLUE CROSS/BLUE SHIELD | Attending: Hematology and Oncology

## 2015-11-04 ENCOUNTER — Inpatient Hospital Stay: Payer: BLUE CROSS/BLUE SHIELD

## 2015-11-04 DIAGNOSIS — E538 Deficiency of other specified B group vitamins: Secondary | ICD-10-CM

## 2015-11-04 DIAGNOSIS — C9000 Multiple myeloma not having achieved remission: Secondary | ICD-10-CM

## 2015-11-04 LAB — COMPREHENSIVE METABOLIC PANEL WITH GFR
ALT: 21 U/L (ref 17–63)
AST: 25 U/L (ref 15–41)
Albumin: 4 g/dL (ref 3.5–5.0)
Alkaline Phosphatase: 53 U/L (ref 38–126)
Anion gap: 8 (ref 5–15)
BUN: 16 mg/dL (ref 6–20)
CO2: 24 mmol/L (ref 22–32)
Calcium: 9 mg/dL (ref 8.9–10.3)
Chloride: 103 mmol/L (ref 101–111)
Creatinine, Ser: 0.79 mg/dL (ref 0.61–1.24)
GFR calc Af Amer: >60 mL/min
GFR calc non Af Amer: >60 mL/min
Glucose, Bld: 200 mg/dL — ABNORMAL HIGH (ref 65–99)
Potassium: 3.5 mmol/L (ref 3.5–5.1)
Sodium: 135 mmol/L (ref 135–145)
Total Bilirubin: 0.6 mg/dL (ref 0.3–1.2)
Total Protein: 7.9 g/dL (ref 6.5–8.1)

## 2015-11-04 LAB — CBC WITH DIFFERENTIAL/PLATELET
Basophils Absolute: 0 10*3/uL (ref 0–0.1)
Basophils Relative: 1 %
Eosinophils Absolute: 0.4 10*3/uL (ref 0–0.7)
Eosinophils Relative: 10 %
HCT: 38.9 % — ABNORMAL LOW (ref 40.0–52.0)
Hemoglobin: 13.5 g/dL (ref 13.0–18.0)
Lymphocytes Relative: 25 %
Lymphs Abs: 1 10*3/uL (ref 1.0–3.6)
MCH: 35.4 pg — ABNORMAL HIGH (ref 26.0–34.0)
MCHC: 34.8 g/dL (ref 32.0–36.0)
MCV: 101.9 fL — ABNORMAL HIGH (ref 80.0–100.0)
Monocytes Absolute: 0.4 10*3/uL (ref 0.2–1.0)
Monocytes Relative: 10 %
Neutro Abs: 2.3 10*3/uL (ref 1.4–6.5)
Neutrophils Relative %: 54 %
Platelets: 218 10*3/uL (ref 150–440)
RBC: 3.82 MIL/uL — ABNORMAL LOW (ref 4.40–5.90)
RDW: 13.4 % (ref 11.5–14.5)
WBC: 4.2 10*3/uL (ref 3.8–10.6)

## 2015-11-04 MED ORDER — CYANOCOBALAMIN 1000 MCG/ML IJ SOLN
1000.0000 ug | Freq: Once | INTRAMUSCULAR | Status: AC
  Administered 2015-11-04: 1000 ug via INTRAMUSCULAR
  Filled 2015-11-04: qty 1

## 2015-11-10 ENCOUNTER — Encounter: Payer: Self-pay | Admitting: Hematology and Oncology

## 2015-11-11 ENCOUNTER — Ambulatory Visit: Payer: BLUE CROSS/BLUE SHIELD | Admitting: Anesthesiology

## 2015-11-11 ENCOUNTER — Other Ambulatory Visit: Payer: Self-pay | Admitting: *Deleted

## 2015-11-11 ENCOUNTER — Ambulatory Visit
Admission: RE | Admit: 2015-11-11 | Discharge: 2015-11-11 | Disposition: A | Discharge status: Home / Self Care | Payer: BLUE CROSS/BLUE SHIELD | Source: Ambulatory Visit | Attending: Gastroenterology | Admitting: Gastroenterology

## 2015-11-11 ENCOUNTER — Encounter
Admission: RE | Disposition: A | Discharge status: Home / Self Care | Payer: Self-pay | Source: Ambulatory Visit | Attending: Gastroenterology

## 2015-11-11 ENCOUNTER — Encounter: Payer: Self-pay | Admitting: Anesthesiology

## 2015-11-11 DIAGNOSIS — K5790 Diverticulosis of intestine, part unspecified, without perforation or abscess without bleeding: Secondary | ICD-10-CM | POA: Insufficient documentation

## 2015-11-11 DIAGNOSIS — Z7984 Long term (current) use of oral hypoglycemic drugs: Secondary | ICD-10-CM | POA: Diagnosis not present

## 2015-11-11 DIAGNOSIS — I252 Old myocardial infarction: Secondary | ICD-10-CM | POA: Diagnosis not present

## 2015-11-11 DIAGNOSIS — I509 Heart failure, unspecified: Secondary | ICD-10-CM | POA: Diagnosis not present

## 2015-11-11 DIAGNOSIS — Z87891 Personal history of nicotine dependence: Secondary | ICD-10-CM | POA: Diagnosis not present

## 2015-11-11 DIAGNOSIS — K219 Gastro-esophageal reflux disease without esophagitis: Secondary | ICD-10-CM | POA: Diagnosis not present

## 2015-11-11 DIAGNOSIS — F329 Major depressive disorder, single episode, unspecified: Secondary | ICD-10-CM | POA: Insufficient documentation

## 2015-11-11 DIAGNOSIS — I209 Angina pectoris, unspecified: Secondary | ICD-10-CM | POA: Insufficient documentation

## 2015-11-11 DIAGNOSIS — E785 Hyperlipidemia, unspecified: Secondary | ICD-10-CM | POA: Insufficient documentation

## 2015-11-11 DIAGNOSIS — I2510 Atherosclerotic heart disease of native coronary artery without angina pectoris: Secondary | ICD-10-CM | POA: Insufficient documentation

## 2015-11-11 DIAGNOSIS — I10 Essential (primary) hypertension: Secondary | ICD-10-CM | POA: Insufficient documentation

## 2015-11-11 DIAGNOSIS — F419 Anxiety disorder, unspecified: Secondary | ICD-10-CM | POA: Diagnosis not present

## 2015-11-11 DIAGNOSIS — R0600 Dyspnea, unspecified: Secondary | ICD-10-CM | POA: Diagnosis not present

## 2015-11-11 DIAGNOSIS — M1990 Unspecified osteoarthritis, unspecified site: Secondary | ICD-10-CM | POA: Diagnosis not present

## 2015-11-11 DIAGNOSIS — M4800 Spinal stenosis, site unspecified: Secondary | ICD-10-CM | POA: Insufficient documentation

## 2015-11-11 DIAGNOSIS — Z951 Presence of aortocoronary bypass graft: Secondary | ICD-10-CM | POA: Insufficient documentation

## 2015-11-11 DIAGNOSIS — E119 Type 2 diabetes mellitus without complications: Secondary | ICD-10-CM | POA: Diagnosis not present

## 2015-11-11 DIAGNOSIS — K5730 Diverticulosis of large intestine without perforation or abscess without bleeding: Secondary | ICD-10-CM | POA: Insufficient documentation

## 2015-11-11 DIAGNOSIS — R933 Abnormal findings on diagnostic imaging of other parts of digestive tract: Secondary | ICD-10-CM | POA: Diagnosis present

## 2015-11-11 DIAGNOSIS — Z79899 Other long term (current) drug therapy: Secondary | ICD-10-CM | POA: Diagnosis not present

## 2015-11-11 DIAGNOSIS — D125 Benign neoplasm of sigmoid colon: Secondary | ICD-10-CM | POA: Diagnosis not present

## 2015-11-11 DIAGNOSIS — R0602 Shortness of breath: Secondary | ICD-10-CM | POA: Insufficient documentation

## 2015-11-11 DIAGNOSIS — Z7982 Long term (current) use of aspirin: Secondary | ICD-10-CM | POA: Diagnosis not present

## 2015-11-11 DIAGNOSIS — Z888 Allergy status to other drugs, medicaments and biological substances status: Secondary | ICD-10-CM | POA: Insufficient documentation

## 2015-11-11 DIAGNOSIS — G4730 Sleep apnea, unspecified: Secondary | ICD-10-CM | POA: Diagnosis not present

## 2015-11-11 DIAGNOSIS — Z85820 Personal history of malignant melanoma of skin: Secondary | ICD-10-CM | POA: Insufficient documentation

## 2015-11-11 HISTORY — PX: COLONOSCOPY WITH PROPOFOL: SHX5780

## 2015-11-11 LAB — GLUCOSE, CAPILLARY: GLUCOSE-CAPILLARY: 130 mg/dL — AB (ref 65–99)

## 2015-11-11 LAB — CBC
HCT: 40.1 % (ref 40.0–52.0)
Hemoglobin: 14.1 g/dL (ref 13.0–18.0)
MCH: 35.8 pg — AB (ref 26.0–34.0)
MCHC: 35.2 g/dL (ref 32.0–36.0)
MCV: 101.9 fL — AB (ref 80.0–100.0)
PLATELETS: 183 10*3/uL (ref 150–440)
RBC: 3.93 MIL/uL — ABNORMAL LOW (ref 4.40–5.90)
RDW: 13.5 % (ref 11.5–14.5)
WBC: 4.2 10*3/uL (ref 3.8–10.6)

## 2015-11-11 SURGERY — COLONOSCOPY WITH PROPOFOL
Anesthesia: General

## 2015-11-11 MED ORDER — SODIUM CHLORIDE 0.9 % IV SOLN
INTRAVENOUS | Status: DC
  Administered 2015-11-11: 10:00:00 via INTRAVENOUS

## 2015-11-11 MED ORDER — PROPOFOL 500 MG/50ML IV EMUL
INTRAVENOUS | Status: DC | PRN
  Administered 2015-11-11: 140 ug/kg/min via INTRAVENOUS

## 2015-11-11 MED ORDER — LENALIDOMIDE 10 MG PO CAPS: 10.0000 mg | ORAL_CAPSULE | ORAL | Status: DC

## 2015-11-11 MED ORDER — SODIUM CHLORIDE 0.9 % IV SOLN
INTRAVENOUS | Status: DC
  Administered 2015-11-11: 09:00:00 via INTRAVENOUS

## 2015-11-11 MED ORDER — PROPOFOL 10 MG/ML IV BOLUS
INTRAVENOUS | Status: DC | PRN
  Administered 2015-11-11: 50 mg via INTRAVENOUS

## 2015-11-11 NOTE — H&P (Addendum)
Outpatient short stay form Pre-procedure 11/11/2015 8:45 AM Luis Sails MD  Primary Physician: Dr Roetta Sessions  Reason for visit:  Colonoscopy  History of present illness:  Patient is a 70 year old male presenting today for colonoscopy. Several months ago he had a CT scan of the abdomen and pelvis in regards to evaluation of a large ventral hernia. CT scan found a thickening in the ascending colon. In the interim since that time he has had a ventral hernia this being a scaling hernia on 08/15/2015. Percent today for colonoscopy. Discussed his case with Dr. Tamala Julian this morning and we should be fine for the colonoscopy. She tolerated his prep well.  He takes no blood thinning products with the exception of an 81 mg aspirin.  Current facility-administered medications:  .  0.9 %  sodium chloride infusion, , Intravenous, Continuous, Luis Sails, MD .  0.9 %  sodium chloride infusion, , Intravenous, Continuous, Luis Sails, MD  Prescriptions prior to admission  Medication Sig Dispense Refill Last Dose  . ALPRAZolam (XANAX) 0.25 MG tablet Take 0.25 mg by mouth at bedtime as needed. for sleep  3 Taking  . amoxicillin (AMOXIL) 500 MG capsule One hour prior to dental appointment (4 capsules)  1 Taking  . aspirin 81 MG tablet Take 81 mg by mouth daily.    Taking  . Bee Pollen 500 MG CHEW Chew 1 tablet by mouth daily.   Taking  . Calcium Carb-Cholecalciferol 600-200 MG-UNIT TABS Take 1 tablet by mouth daily.   Taking  . ergocalciferol (VITAMIN D2) 50000 UNITS capsule Take 50,000 Units by mouth once a week.   Taking  . HYDROcodone-acetaminophen (NORCO) 5-325 MG tablet Take 1-2 tablets by mouth every 4 (four) hours as needed for moderate pain. 20 tablet 0 Taking  . KLOR-CON M20 20 MEQ tablet TAKE 1 TABLET (20 MEQ TOTAL) BY MOUTH DAILY. 30 tablet 2   . lenalidomide (REVLIMID) 10 MG capsule Take 1 capsule (10 mg total) by mouth every other day. Auth # [id number] 46 capsule 0 Taking  . lisinopril  (PRINIVIL,ZESTRIL) 20 MG tablet Take 1 tablet by mouth daily.   Taking  . lovastatin (MEVACOR) 40 MG tablet Take 40 mg by mouth at bedtime.   Taking  . metFORMIN (GLUCOPHAGE) 500 MG tablet Take 500 mg by mouth daily with breakfast.   Taking  . omeprazole (PRILOSEC) 20 MG capsule Take 20 mg by mouth daily.   Taking  . SBI/Protein Isolate (ENTERAGAM) 5 G PACK Take 1 packet by mouth daily. 30 each 2 Taking  . traMADol (ULTRAM) 50 MG tablet TAKE 1 TABLET 3 TIMES A DAY AS NEEDED 60 tablet 0   . vitamin B-12 (CYANOCOBALAMIN) 1000 MCG tablet Take 1,000 mcg by mouth. 2-3 times a week   Taking  . zolendronic acid 4 mg in sodium chloride 0.9 % 100 mL Inject 4 mg into the vein every 30 (thirty) days.   Taking     Allergies  Allergen Reactions  . Pravachol [Pravastatin]   . Pravastatin Sodium Other (See Comments)  . Statins Other (See Comments)    Muscle aches  . Zocor [Simvastatin] Other (See Comments)    Muscle aches     Past Medical History  Diagnosis Date  . Hypertension   . Spinal stenosis   . GERD (gastroesophageal reflux disease)   . CHF (congestive heart failure) ([place])   . Arthritis   . Angina pectoris ([place])   . Hyperlipidemia   . Multiple myeloma ([place])   .  Multiple myeloma ([place]) 03/27/2015  . Diabetes mellitus without complication [facility])     Patient takes Metformin.  . Sleep apnea     No CPAP @ present  . Shortness of breath dyspnea   . Anxiety   . Depression   . Coronary artery disease   . Diverticulosis   . Myocardial infarction [facility]) April 2001    widowmaker    Review of systems:      Physical Exam    Heart and lungs: Regular rate and rhythm without rub or gallop, lungs are bilaterally clear.    HEENT: Normocephalic atraumatic eyes are anicteric    Other:     Pertinant exam for procedure: Soft mild tenderness to palpation left lower quadrant associated with his incision site. Bowel sounds are positive normoactive. Is no rebound.    Planned proceedures:  Colonoscopy and indicated procedures.    Luis Sails, MD Gastroenterology 11/11/2015  8:45 AM

## 2015-11-11 NOTE — Anesthesia Preprocedure Evaluation (Signed)
Anesthesia Evaluation  Patient identified by MRN, date of birth, ID band Patient awake    Reviewed: Allergy & Precautions, H&P , NPO status , Patient's Chart, lab work & pertinent test results  History of Anesthesia Complications Negative for: history of anesthetic complications  Airway Mallampati: III  TM Distance: >3 FB Neck ROM: limited    Dental  (+) Poor Dentition, Chipped, Missing   Pulmonary shortness of breath, sleep apnea , former smoker,    Pulmonary exam normal breath sounds clear to auscultation       Cardiovascular Exercise Tolerance: Good hypertension, (-) angina+ CAD, + Past MI, + CABG and +CHF  (-) DOE Normal cardiovascular exam Rhythm:regular Rate:Normal     Neuro/Psych PSYCHIATRIC DISORDERS Anxiety Depression  Neuromuscular disease    GI/Hepatic Neg liver ROS, GERD  Controlled,  Endo/Other  diabetes, Type 2  Renal/GU negative Renal ROS  negative genitourinary   Musculoskeletal  (+) Arthritis ,   Abdominal   Peds  Hematology negative hematology ROS (+)   Anesthesia Other Findings Past Medical History:   Hypertension                                                 Spinal stenosis                                              GERD (gastroesophageal reflux disease)                       CHF (congestive heart failure) (HCC)                         Arthritis                                                    Angina pectoris (HCC)                                        Hyperlipidemia                                               Multiple myeloma (HCC)                                       Multiple myeloma ([place])                          03/27/2015    Diabetes mellitus without complication ([place])                   Comment:Patient takes Metformin.   Sleep apnea  Comment:No CPAP @ present   Shortness of breath dyspnea                                 Anxiety                                                      Depression                                                   Coronary artery disease                                      Diverticulosis                                               Myocardial infarction [facility])                     April 2001     Comment:widowmaker  Past Surgical History:   ROTATOR CUFF REPAIR                                           CORONARY ARTERY BYPASS GRAFT                                  PILONIDAL CYST EXCISION                                       TOTAL HIP ARTHROPLASTY                          Right              CATARACT EXTRACTION                                           INGUINAL HERNIA REPAIR                                        CARPAL TUNNEL RELEASE                                         CARDIAC CATHETERIZATION  JOINT REPLACEMENT                               Right 2008           Comment:Right Total Hip Replacement   EYE SURGERY                                     Bilateral                Comment:Cataract Extraction   VENTRAL HERNIA REPAIR                           N/A 08/15/2015      Comment:Procedure: VENTRAL HERNIA REPAIR WITH MESH ;                Surgeon: Leonie Green, MD;  Location:               [facility] ORS;  Service: General;  Laterality: N/A;  Patient has cardiac clearance for this procedure.     Reproductive/Obstetrics negative OB ROS                             Anesthesia Physical Anesthesia Plan  ASA: IV  Anesthesia Plan: General   Post-op Pain Management:    Induction:   Airway Management Planned:   Additional Equipment:   Intra-op Plan:   Post-operative Plan:   Informed Consent: I have reviewed the patients History and Physical, chart, labs and discussed the procedure including the risks, benefits and alternatives for the proposed anesthesia with the patient or authorized representative who has  indicated his/her understanding and acceptance.   Dental Advisory Given  Plan Discussed with: Anesthesiologist, CRNA and Surgeon  Anesthesia Plan Comments:         Anesthesia Quick Evaluation

## 2015-11-11 NOTE — Op Note (Signed)
[facility] Gastroenterology Patient Name: Luis [name] Procedure Date: 11/11/2015 10:09 AM MRN: [id number] Account #: [id number] Date of Birth: [birth year] Admit Type: Outpatient Age: 70 Room: [facility] ENDO ROOM 3 Gender: Male Note Status: Finalized Procedure:         Colonoscopy Indications:       Abnormal CT of the GI tract Providers:         Lollie Sails, MD Referring MD:      Tracie Harrier, MD (Referring MD) Medicines:         Monitored Anesthesia Care Complications:     No immediate complications. Procedure:         Pre-Anesthesia Assessment:                    - ASA Grade Assessment: III - A patient with severe                     systemic disease.                    After obtaining informed consent, the colonoscope was                     passed under direct vision. Throughout the procedure, the                     patient's blood pressure, pulse, and oxygen saturations                     were monitored continuously. The Colonoscope was                     introduced through the anus and advanced to the the cecum,                     identified by appendiceal orifice and ileocecal valve. The                     colonoscopy was performed without difficulty. The patient                     tolerated the procedure well. The quality of the bowel                     preparation was good. Findings:      Multiple small and large-mouthed diverticula were found in the sigmoid       colon, in the distal descending colon and in the ascending colon.      A 2 mm polyp was found in the mid sigmoid colon. The polyp was flat. The       polyp was removed with a cold biopsy forceps. Resection and retrieval       were complete.      The retroflexed view of the distal rectum and anal verge was normal and       showed no anal or rectal abnormalities.      The digital rectal exam was normal. Prominant anal pillar noted.      The exam was otherwise without abnormality.    No evidence of abnormality in the ascending colon specifically. Impression:        - Diverticulosis in the sigmoid colon, in the distal                     descending colon and in  the ascending colon.                    - One 2 mm polyp in the mid sigmoid colon. Resected and                     retrieved.                    - The distal rectum and anal verge are normal on                     retroflexion view.                    - The examination was otherwise normal. Recommendation:    - Telephone GI clinic for pathology results in 1 week. Procedure Code(s): --- Professional ---                    [phone], Colonoscopy, flexible; with biopsy, single or                     multiple Diagnosis Code(s): --- Professional ---                    D12.5, Benign neoplasm of sigmoid colon                    K57.30, Diverticulosis of large intestine without                     perforation or abscess without bleeding                    R93.3, Abnormal findings on diagnostic imaging of other                     parts of digestive tract CPT copyright 2014 American Medical Association. All rights reserved. The codes documented in this report are preliminary and upon coder review may  be revised to meet current compliance requirements. Lollie Sails, MD 11/11/2015 10:45:47 AM This report has been signed electronically. Number of Addenda: 0 Note Initiated On: 11/11/2015 10:09 AM Scope Withdrawal Time: 0 hours 10 minutes 40 seconds  Total Procedure Duration: 0 hours 19 minutes 23 seconds       [facility]

## 2015-11-11 NOTE — Anesthesia Postprocedure Evaluation (Signed)
Anesthesia Post Note  Patient: Luis [name].  Procedure(s) Performed: Procedure(s) (LRB): COLONOSCOPY WITH PROPOFOL (N/A)  Patient location during evaluation: Endoscopy Anesthesia Type: General Level of consciousness: awake and alert Pain management: pain level controlled Vital Signs Assessment: post-procedure vital signs reviewed and stable Respiratory status: spontaneous breathing, nonlabored ventilation, respiratory function stable and patient connected to nasal cannula oxygen Cardiovascular status: blood pressure returned to baseline and stable Postop Assessment: no signs of nausea or vomiting Anesthetic complications: no    Last Vitals:  Filed Vitals:   11/11/15 1048 11/11/15 1050  BP: 128/48 128/48  Pulse: 62   Temp: 36.1 C   Resp: 20     Last Pain:  Filed Vitals:   11/11/15 1132  PainSc: 0-No pain                 Precious Haws Geneal Huebert

## 2015-11-11 NOTE — Transfer of Care (Signed)
Immediate Anesthesia Transfer of Care Note  Patient: Luis [name].  Procedure(s) Performed: Procedure(s): COLONOSCOPY WITH PROPOFOL (N/A)  Patient Location: PACU  Anesthesia Type:General  Level of Consciousness: sedated  Airway & Oxygen Therapy: Patient Spontanous Breathing and Patient connected to nasal cannula oxygen  Post-op Assessment: Report given to RN and Post -op Vital signs reviewed and stable  Post vital signs: Reviewed and stable  Last Vitals:  Filed Vitals:   11/11/15 0815  BP: 166/81  Pulse: 69  Temp: 36.3 C  Resp: 19    Complications: No apparent anesthesia complications

## 2015-11-12 ENCOUNTER — Encounter: Payer: Self-pay | Admitting: Gastroenterology

## 2015-11-12 LAB — SURGICAL PATHOLOGY

## 2015-11-14 ENCOUNTER — Other Ambulatory Visit: Payer: Self-pay | Admitting: Hematology and Oncology

## 2015-11-16 IMAGING — CR DG BONE SURVEY MET
7 of 10 series · 7 of 10 positions shown · non-contrast
Comparison: 09/24/2014

CLINICAL DATA: Multiple myeloma. Known lytic lesions. Last oral
chemotherapy 2 days ago. Previous right hip surgery. Cardiac bypass
8222.

EXAM:
METASTATIC BONE SURVEY

[chest pa]
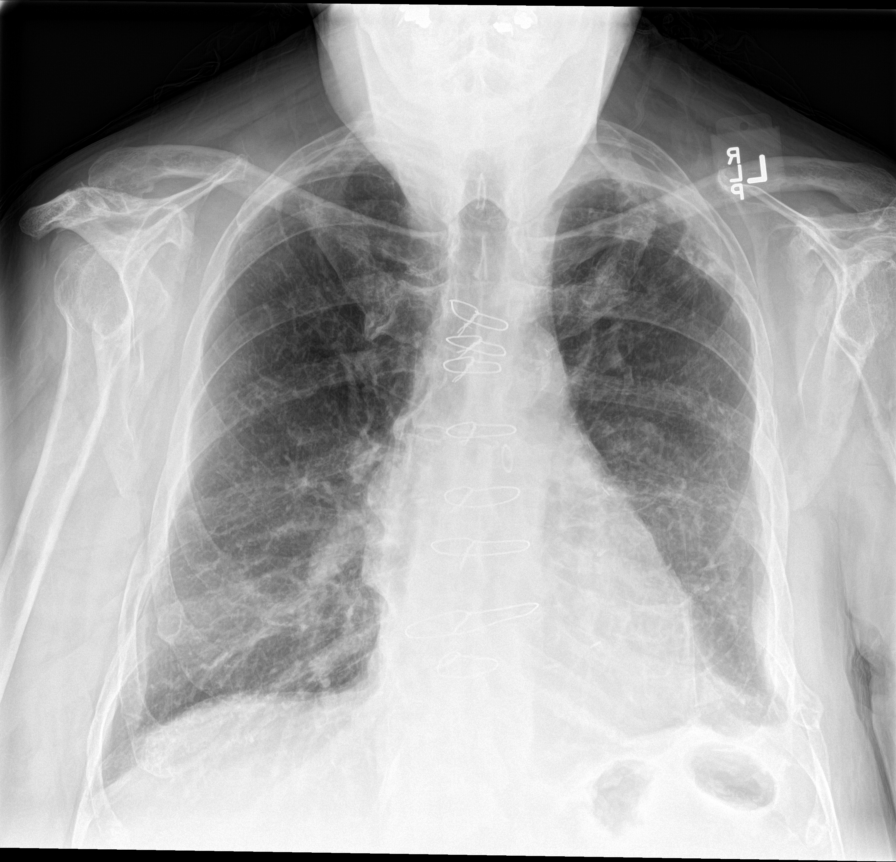

[c-spine lat]
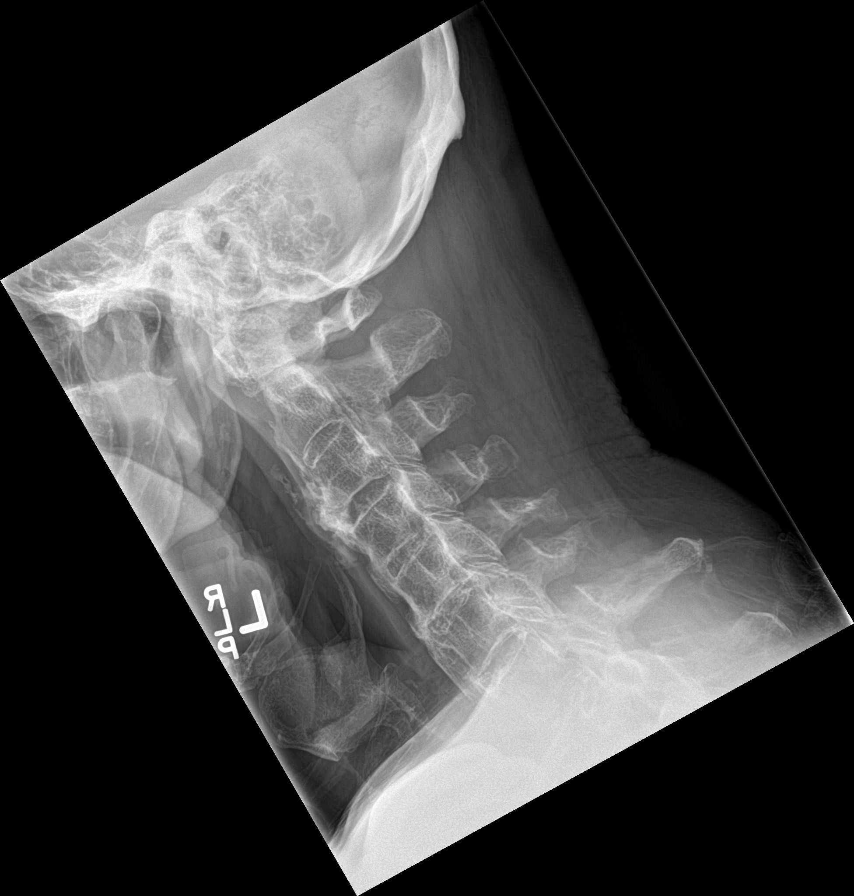

[c-spine swimmers]
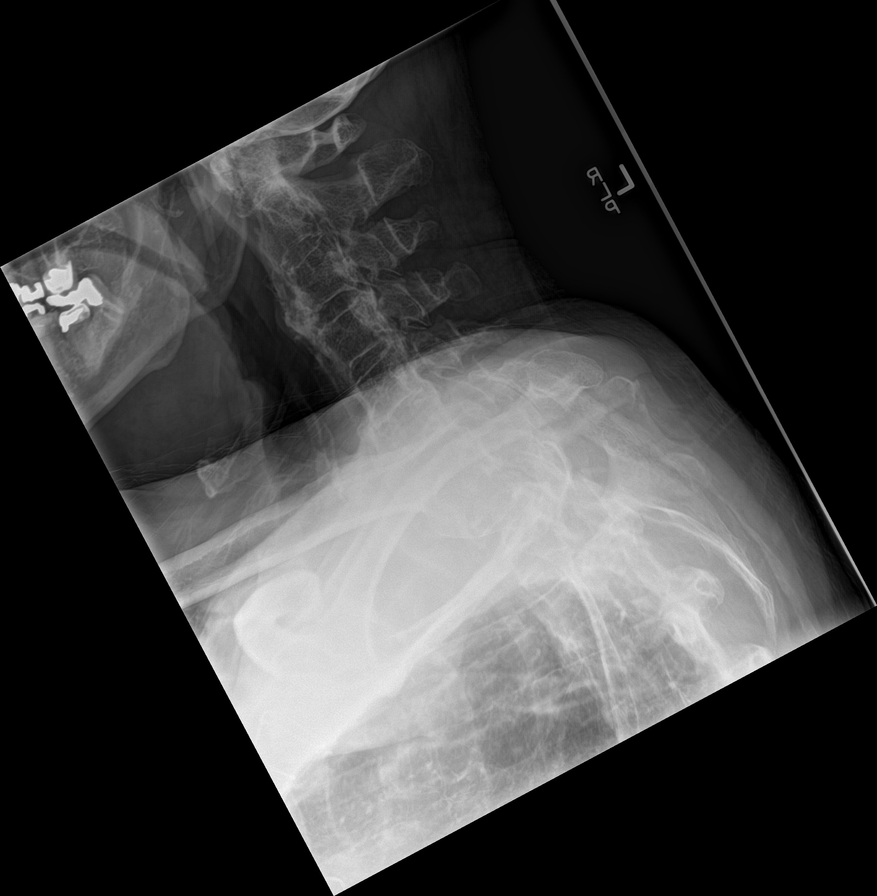

[skull lat]
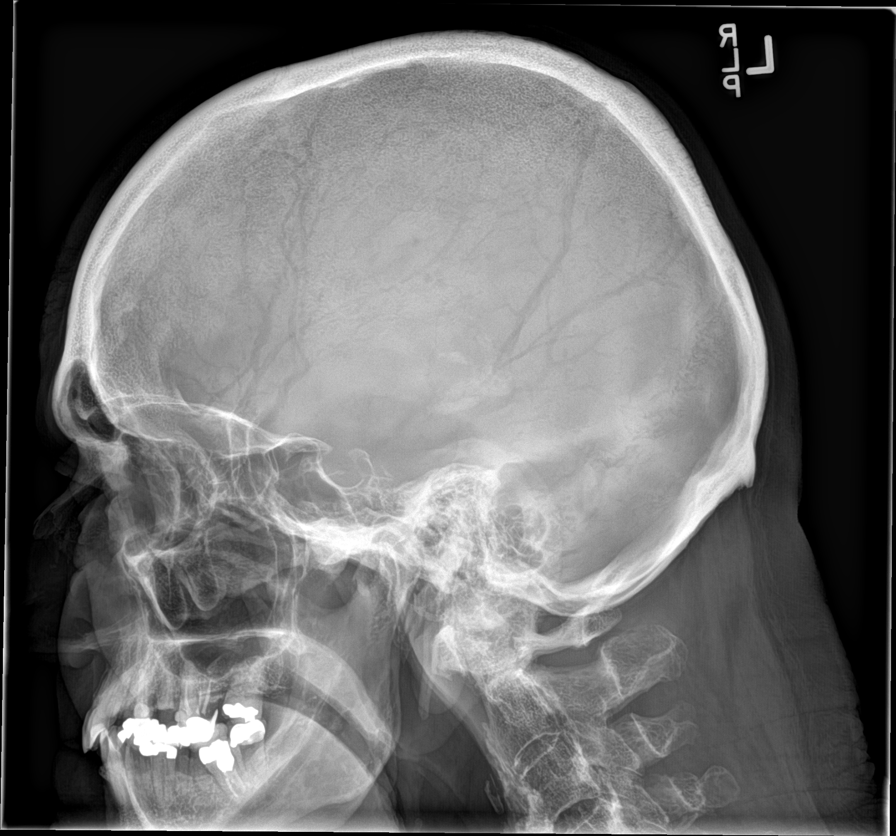

[c-spine ap]
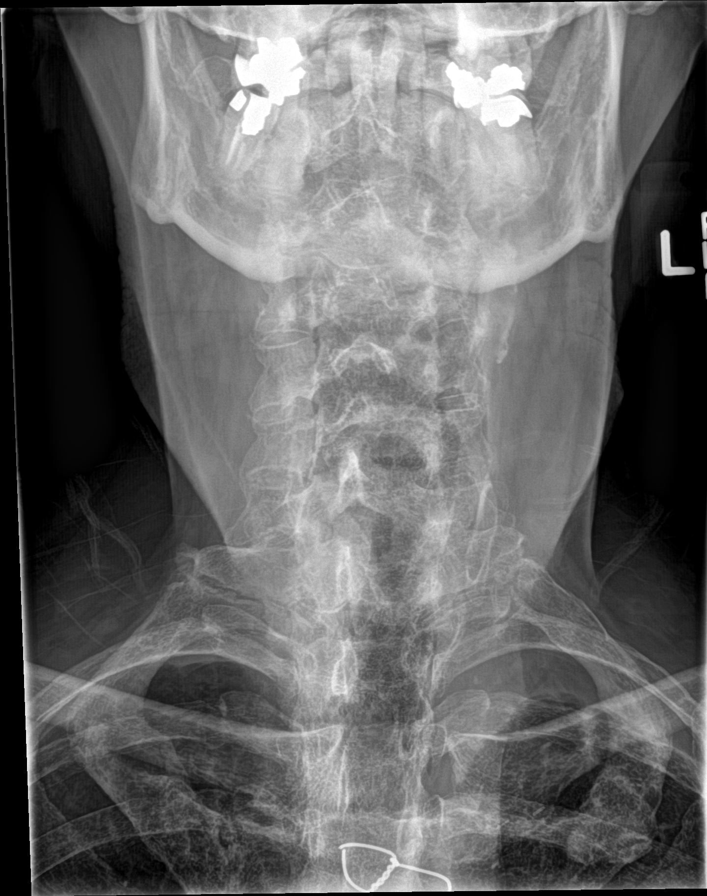

[shoulder ap (1 of 2)]
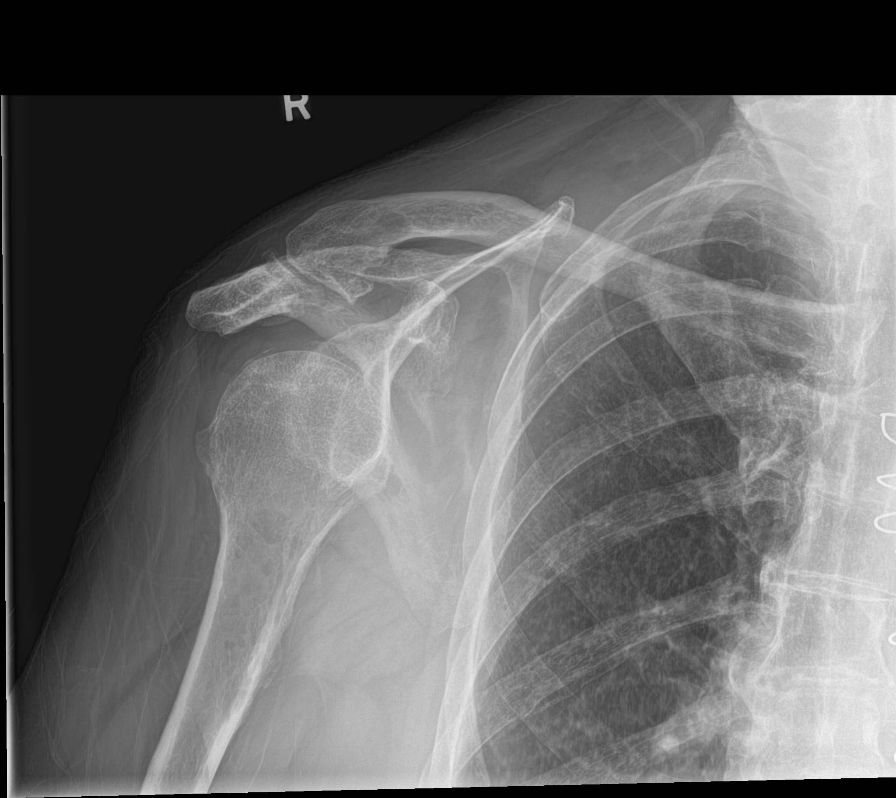

[shoulder ap (2 of 2)]
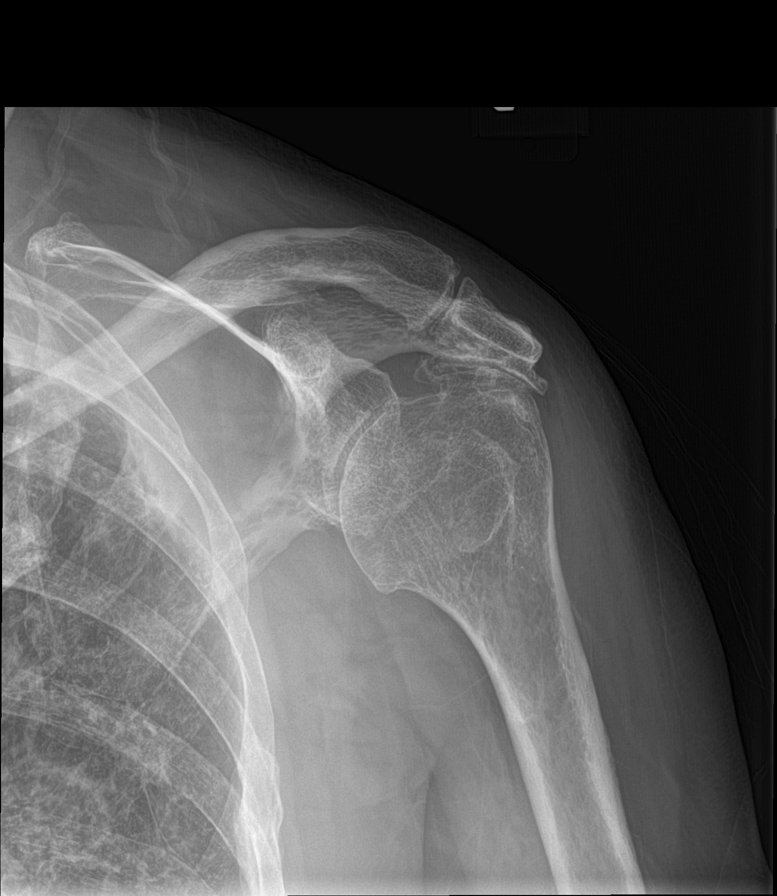

[7 of 10 positions shown; findings below may reference images not displayed]

FINDINGS: Patient has had median sternotomy. The heart is mildly enlarged.
Biapical pleural parenchymal changes are again identified, left
greater than right. There is left basilar pleural thickening or
small amount of pleural fluid, stable in appearance.

There is loss of cervical lordosis. There is stable ankylosis of the
cervical spine. No discrete lytic lesions.

Discrete upper mid calvarial lesion noted on the previous exam is
not well seen on the current study possibly due to differences in
positioning. There are small lytic lesions within the skull, stable
in appearance.

There are numerous lytic lesions within the proximal right humerus,
lateral right scapula, and distal right clavicle probably stable in
appearance. Similar lytic lesions are identified within the lateral
left scapula and proximal left humerus. I suspect early lesions
within the distal left clavicle. These are possibly new. No new
forearm or wrist lesions.

Numerous lytic and expansile rib lesions are probably stable. There
is ankylosis of the thoracic spine. No acute fractures. Degenerative
changes are seen in the lumbar spine. No acute fractures. Dense
atherosclerotic calcification of the abdominal aorta.

Status post right hip hemiarthroplasty. Numerous lytic lesions are
identified throughout the pelvis which appear stable. Significant
degenerative change involving the left hip. Progression of small
lytic lesions in the right mid femur. Stable left femoral head
lesion. Possible developing lucent lesions in the proximal left
femoral shaft. No new lesions identified within the lower legs
bilaterally.
IMPRESSION: 1. No evidence for acute fracture.
2. Majority of the small lytic lesions are largely stable.
3. Progression of the lesions involving the left distal clavicle,
proximal left and right femurs.
4. Larger calvarial lesion described on the prior study is not well
seen but this may be positional. Other calvarial lesions appear
stable.

## 2015-11-27 ENCOUNTER — Other Ambulatory Visit: Payer: Self-pay

## 2015-11-27 ENCOUNTER — Ambulatory Visit
Admission: RE | Admit: 2015-11-27 | Discharge: 2015-11-27 | Disposition: A | Discharge status: Home / Self Care | Payer: BLUE CROSS/BLUE SHIELD | Source: Ambulatory Visit | Attending: Hematology and Oncology | Admitting: Hematology and Oncology

## 2015-11-27 DIAGNOSIS — C9000 Multiple myeloma not having achieved remission: Secondary | ICD-10-CM

## 2015-12-01 ENCOUNTER — Other Ambulatory Visit: Payer: Self-pay

## 2015-12-01 ENCOUNTER — Inpatient Hospital Stay: Payer: BLUE CROSS/BLUE SHIELD | Attending: Hematology and Oncology | Admitting: Hematology and Oncology

## 2015-12-01 ENCOUNTER — Inpatient Hospital Stay: Payer: BLUE CROSS/BLUE SHIELD

## 2015-12-01 VITALS — BP 150/77 | HR 66 | Temp 98.1°F | Resp 18 | Ht 67.0 in | Wt 203.7 lb

## 2015-12-01 DIAGNOSIS — C9001 Multiple myeloma in remission: Secondary | ICD-10-CM | POA: Diagnosis present

## 2015-12-01 DIAGNOSIS — M129 Arthropathy, unspecified: Secondary | ICD-10-CM | POA: Diagnosis not present

## 2015-12-01 DIAGNOSIS — I2510 Atherosclerotic heart disease of native coronary artery without angina pectoris: Secondary | ICD-10-CM | POA: Insufficient documentation

## 2015-12-01 DIAGNOSIS — Z79899 Other long term (current) drug therapy: Secondary | ICD-10-CM | POA: Insufficient documentation

## 2015-12-01 DIAGNOSIS — K219 Gastro-esophageal reflux disease without esophagitis: Secondary | ICD-10-CM | POA: Diagnosis not present

## 2015-12-01 DIAGNOSIS — Z7984 Long term (current) use of oral hypoglycemic drugs: Secondary | ICD-10-CM

## 2015-12-01 DIAGNOSIS — E538 Deficiency of other specified B group vitamins: Secondary | ICD-10-CM | POA: Diagnosis not present

## 2015-12-01 DIAGNOSIS — C9000 Multiple myeloma not having achieved remission: Secondary | ICD-10-CM

## 2015-12-01 DIAGNOSIS — F419 Anxiety disorder, unspecified: Secondary | ICD-10-CM | POA: Diagnosis not present

## 2015-12-01 DIAGNOSIS — E119 Type 2 diabetes mellitus without complications: Secondary | ICD-10-CM | POA: Insufficient documentation

## 2015-12-01 DIAGNOSIS — M4800 Spinal stenosis, site unspecified: Secondary | ICD-10-CM | POA: Diagnosis not present

## 2015-12-01 DIAGNOSIS — I252 Old myocardial infarction: Secondary | ICD-10-CM

## 2015-12-01 DIAGNOSIS — E785 Hyperlipidemia, unspecified: Secondary | ICD-10-CM | POA: Insufficient documentation

## 2015-12-01 DIAGNOSIS — G4730 Sleep apnea, unspecified: Secondary | ICD-10-CM | POA: Diagnosis not present

## 2015-12-01 DIAGNOSIS — F329 Major depressive disorder, single episode, unspecified: Secondary | ICD-10-CM | POA: Diagnosis not present

## 2015-12-01 DIAGNOSIS — D125 Benign neoplasm of sigmoid colon: Secondary | ICD-10-CM | POA: Diagnosis not present

## 2015-12-01 DIAGNOSIS — I10 Essential (primary) hypertension: Secondary | ICD-10-CM | POA: Insufficient documentation

## 2015-12-01 DIAGNOSIS — Z7982 Long term (current) use of aspirin: Secondary | ICD-10-CM | POA: Diagnosis not present

## 2015-12-01 DIAGNOSIS — Z87891 Personal history of nicotine dependence: Secondary | ICD-10-CM | POA: Diagnosis not present

## 2015-12-01 DIAGNOSIS — K5730 Diverticulosis of large intestine without perforation or abscess without bleeding: Secondary | ICD-10-CM | POA: Diagnosis not present

## 2015-12-01 DIAGNOSIS — I509 Heart failure, unspecified: Secondary | ICD-10-CM

## 2015-12-01 DIAGNOSIS — R0602 Shortness of breath: Secondary | ICD-10-CM | POA: Insufficient documentation

## 2015-12-01 DIAGNOSIS — I209 Angina pectoris, unspecified: Secondary | ICD-10-CM | POA: Diagnosis not present

## 2015-12-01 LAB — COMPREHENSIVE METABOLIC PANEL
ALT: 22 U/L (ref 17–63)
AST: 25 U/L (ref 15–41)
Albumin: 4.1 g/dL (ref 3.5–5.0)
Alkaline Phosphatase: 50 U/L (ref 38–126)
Anion gap: 10 (ref 5–15)
BUN: 18 mg/dL (ref 6–20)
CO2: 21 mmol/L — ABNORMAL LOW (ref 22–32)
Calcium: 9 mg/dL (ref 8.9–10.3)
Chloride: 102 mmol/L (ref 101–111)
Creatinine, Ser: 0.99 mg/dL (ref 0.61–1.24)
GFR calc Af Amer: >60 mL/min (ref >60)
GFR calc non Af Amer: >60 mL/min (ref >60)
Glucose, Bld: 139 mg/dL — ABNORMAL HIGH (ref 65–99)
Potassium: 4.1 mmol/L (ref 3.5–5.1)
Sodium: 133 mmol/L — ABNORMAL LOW (ref 135–145)
Total Bilirubin: 0.6 mg/dL (ref 0.3–1.2)
Total Protein: 8.1 g/dL (ref 6.5–8.1)

## 2015-12-01 LAB — CBC WITH DIFFERENTIAL/PLATELET
Basophils Absolute: 0.1 10*3/uL (ref 0–0.1)
Basophils Relative: 3 %
Eosinophils Absolute: 0.4 10*3/uL (ref 0–0.7)
Eosinophils Relative: 10 %
HCT: 40.5 % (ref 40.0–52.0)
Hemoglobin: 14.1 g/dL (ref 13.0–18.0)
Lymphocytes Relative: 31 %
Lymphs Abs: 1.2 10*3/uL (ref 1.0–3.6)
MCH: 35.4 pg — ABNORMAL HIGH (ref 26.0–34.0)
MCHC: 34.8 g/dL (ref 32.0–36.0)
MCV: 101.6 fL — ABNORMAL HIGH (ref 80.0–100.0)
Monocytes Absolute: 0.6 10*3/uL (ref 0.2–1.0)
Monocytes Relative: 15 %
Neutro Abs: 1.5 10*3/uL (ref 1.4–6.5)
Neutrophils Relative %: 41 %
Platelets: 191 10*3/uL (ref 150–440)
RBC: 3.99 MIL/uL — ABNORMAL LOW (ref 4.40–5.90)
RDW: 13.8 % (ref 11.5–14.5)
WBC: 3.7 10*3/uL — ABNORMAL LOW (ref 3.8–10.6)

## 2015-12-01 MED ORDER — CYANOCOBALAMIN 1000 MCG/ML IJ SOLN
1000.0000 ug | Freq: Once | INTRAMUSCULAR | Status: AC
  Administered 2015-12-01: 1000 ug via INTRAMUSCULAR
  Filled 2015-12-01: qty 1

## 2015-12-01 NOTE — Progress Notes (Signed)
[facility] day:  12/01/2015   Chief Complaint: Luis [name]. is a 70 y.o. male with multiple myeloma on Revlimid who is seen for 2 month assessment on Revlimid prior to monthly B12.  HPI: The patient was last seen in the medical oncology clinic on 10/06/2015.  At that time, he was struggling with diarrhea.  He remained on Enterogam.  Abdominal and pelvic CT scan had revealed a questionable wall thickening of the ascending colon versus artifact from incomplete distention.  Colonoscopy on 11/11/2015 revealed diverticulosis in the sigmoid colon and distal descending colon and ascending colon.  There was a 2 mm polyp in the mid sigmoid colon.  Pathology revealed a hyperplastic polyp, negative for dysplasia and malignancy.  Regarding his myeloma, he was doing well.  SPEP on 10/06/2015 revealed no monoclonal protein.  Bone survey on 11/27/2015 revealed widespread bony lytic lesions consistent with multiple myeloma.  The vast majority of lesions were stable.  A few small lesions were not previously seen.  One lesion was previously obscured by bowel contents.  Two small lesions in the right distal femoral diaphysis appeared new.  During the interim, he has felt pretty good. He is off his central Gamma. He describes previously taking is an trochanter with cranberry juice than 1-2 hours later bowel movement. He states by spreading his pills out, his diarrhea is less. He is going 1-2 times a day some days he has 3 bowel movements.  He received his B12 on 11/04/2015 he last received his omental on 10/06/2015.  He states he trying to get back on his treadmill. His weight is up 3-4 pounds over the holidays. He denies any infections.  He has issues with spinal stenosis. He is unsure about surgery.  Past Medical History  Diagnosis Date  . Hypertension   . Spinal stenosis   . GERD (gastroesophageal reflux disease)   . CHF (congestive heart failure) ([place])   .  Arthritis   . Angina pectoris ([place])   . Hyperlipidemia   . Multiple myeloma ([place])   . Multiple myeloma ([place]) 03/27/2015  . Diabetes mellitus without complication [facility])     Patient takes Metformin.  . Sleep apnea     No CPAP @ present  . Shortness of breath dyspnea   . Anxiety   . Depression   . Coronary artery disease   . Diverticulosis   . Myocardial infarction [facility]) April 2001    widowmaker    Past Surgical History  Procedure Laterality Date  . Rotator cuff repair    . Coronary artery bypass graft    . Pilonidal cyst excision    . Total hip arthroplasty Right   . Cataract extraction    . Inguinal hernia repair    . Carpal tunnel release    . Cardiac catheterization    . Joint replacement Right 2008    Right Total Hip Replacement  . Eye surgery Bilateral     Cataract Extraction  . Ventral hernia repair N/A 08/15/2015    Procedure: VENTRAL HERNIA REPAIR WITH MESH ;  Surgeon: Leonie Green, MD;  Location: [facility] ORS;  Service: General;  Laterality: N/A;  . Colonoscopy with propofol N/A 11/11/2015    Procedure: COLONOSCOPY WITH PROPOFOL;  Surgeon: Lollie Sails, MD;  Location: [facility] ENDOSCOPY;  Service: Endoscopy;  Laterality: N/A;    Family History  Problem Relation Age of Onset  . Heart disease Father   . Stroke Mother   .  Prostate cancer Maternal Grandfather 17    Social History:  reports that he quit smoking about 15 years ago. His smoking use included Cigarettes. He has a 60 pack-year smoking history. He quit smokeless tobacco use about 15 years ago. He reports that he does not drink alcohol or use illicit drugs.  The patient is alone today.  Allergies:  Allergies  Allergen Reactions  . Pravachol [Pravastatin]   . Pravastatin Sodium Other (See Comments)  . Statins Other (See Comments)    Muscle aches  . Zocor [Simvastatin] Other (See Comments)    Muscle aches    Current Medications: Current Outpatient Prescriptions  Medication Sig Dispense Refill  .  ALPRAZolam (XANAX) 0.25 MG tablet Take 0.25 mg by mouth at bedtime as needed. for sleep  3  . aspirin 81 MG tablet Take 81 mg by mouth daily.     [name] Pollen 500 MG CHEW Chew 1 tablet by mouth daily.    . Calcium Carb-Cholecalciferol 600-200 MG-UNIT TABS Take 1 tablet by mouth daily.    . ergocalciferol (VITAMIN D2) 50000 UNITS capsule Take 50,000 Units by mouth once a week.    Marland Kitchen HYDROcodone-acetaminophen (NORCO) 5-325 MG tablet Take 1-2 tablets by mouth every 4 (four) hours as needed for moderate pain. 20 tablet 0  . KLOR-CON M20 20 MEQ tablet TAKE 1 TABLET (20 MEQ TOTAL) BY MOUTH DAILY. 30 tablet 2  . lenalidomide (REVLIMID) 10 MG capsule Take 1 capsule (10 mg total) by mouth every other day. Auth # [id number] 14 capsule 0  . lisinopril (PRINIVIL,ZESTRIL) 20 MG tablet Take 1 tablet by mouth daily.    Marland Kitchen lovastatin (MEVACOR) 40 MG tablet Take 40 mg by mouth at bedtime.    . metFORMIN (GLUCOPHAGE) 500 MG tablet Take 500 mg by mouth daily with breakfast.    . omeprazole (PRILOSEC) 20 MG capsule Take 20 mg by mouth daily.    . traMADol (ULTRAM) 50 MG tablet TAKE 1 TABLET 3 TIMES A DAY AS NEEDED 60 tablet 0  . vitamin B-12 (CYANOCOBALAMIN) 1000 MCG tablet Take 1,000 mcg by mouth. 2-3 times a week    . zolendronic acid 4 mg in sodium chloride 0.9 % 100 mL Inject 4 mg into the vein every 30 (thirty) days.     No current facility-administered medications for this visit.    Review of Systems:  GENERAL:  Feels pretty good. No fevers or sweats.  Weight gain of 3-4 pounds over holidays. PERFORMANCE STATUS (ECOG): 1 HEENT: No visual changes, runny nose, sore throat, mouth sores or tenderness. Lungs: No shortness of breath or cough. No hemoptysis. Cardiac: No chest pain, palpitations, orthopnea, or PND.  Pre-op echo and stress test good. GI: Diarrhea better with pills spread out. No nausea, vomiting, constipation, melena or hematochezia. GU: No urgency, frequency, dysuria, or  hematuria. Musculoskeletal: Spinal stenosis. No joint pain. No muscle tenderness. Extremities: No pain or swelling. Skin: No rashes or skin changes. Neuro: No headache, numbness or weakness, balance or coordination issues. Endocrine: Diabetes.  No tthyroid issues, hot flashes or night sweats. Psych: No mood changes, depression or anxiety. Pain: No focal pain. Review of systems: All other systems reviewed and found to be negative.  Physical Exam: Blood pressure 150/77, pulse 66, temperature 98.1 F (36.7 C), temperature source Tympanic, resp. rate 18, height '5\' 7"'$  (1.702 m), weight 203 lb 11.3 oz (92.4 kg).  GENERAL: Well developed, well nourished, sitting comfortably in the exam room in no acute distress. MENTAL STATUS: Alert  and oriented to person, place and time. HEAD: Glasses propped up.  Gray/white hair. Male pattern baldness. Normocephalic, atraumatic, face symmetric, no Cushingoid features. EYES: Blue eyes. Pupils equal round and reactive to light and accomodation. No conjunctivitis or scleral icterus. ENT: Oropharynx clear without lesion. Tongue normal. Mucous membranes moist.  RESPIRATORY: Clear to auscultation without rales, wheezes or rhonchi. CARDIOVASCULAR: Regular rate and rhythm without murmur, rub or gallop. ABDOMEN: Soft, non-tender, with active bowel sounds, and no hepatosplenomegaly. No masses. SKIN: No rashes, ulcers or lesions. EXTREMITIES: No edema, no skin discoloration or tenderness. No palpable cords. LYMPH NODES: No palpable cervical, supraclavicular, axillary or inguinal adenopathy  NEUROLOGICAL: Unremarkable. PSYCH: Appropriate  Appointment on 12/01/2015  Component Date Value Ref Range Status  . WBC 12/01/2015 3.7* 3.8 - 10.6 K/uL Final  . RBC 12/01/2015 3.99* 4.40 - 5.90 MIL/uL Final  . Hemoglobin 12/01/2015 14.1  13.0 - 18.0 g/dL Final  . HCT 12/01/2015 40.5  40.0 - 52.0 % Final  . MCV 12/01/2015 101.6* 80.0 - 100.0 fL Final   . MCH 12/01/2015 35.4* 26.0 - 34.0 pg Final  . MCHC 12/01/2015 34.8  32.0 - 36.0 g/dL Final  . RDW 12/01/2015 13.8  11.5 - 14.5 % Final  . Platelets 12/01/2015 191  150 - 440 K/uL Final  . Neutrophils Relative % 12/01/2015 41   Final  . Neutro Abs 12/01/2015 1.5  1.4 - 6.5 K/uL Final  . Lymphocytes Relative 12/01/2015 31   Final  . Lymphs Abs 12/01/2015 1.2  1.0 - 3.6 K/uL Final  . Monocytes Relative 12/01/2015 15   Final  . Monocytes Absolute 12/01/2015 0.6  0.2 - 1.0 K/uL Final  . Eosinophils Relative 12/01/2015 10   Final  . Eosinophils Absolute 12/01/2015 0.4  0 - 0.7 K/uL Final  . Basophils Relative 12/01/2015 3   Final  . Basophils Absolute 12/01/2015 0.1  0 - 0.1 K/uL Final  . Sodium 12/01/2015 133* 135 - 145 mmol/L Final  . Potassium 12/01/2015 4.1  3.5 - 5.1 mmol/L Final  . Chloride 12/01/2015 102  101 - 111 mmol/L Final  . CO2 12/01/2015 21* 22 - 32 mmol/L Final  . Glucose, Bld 12/01/2015 139* 65 - 99 mg/dL Final  . BUN 12/01/2015 18  6 - 20 mg/dL Final  . Creatinine, Ser 12/01/2015 0.99  0.61 - 1.24 mg/dL Final  . Calcium 12/01/2015 9.0  8.9 - 10.3 mg/dL Final  . Total Protein 12/01/2015 8.1  6.5 - 8.1 g/dL Final  . Albumin 12/01/2015 4.1  3.5 - 5.0 g/dL Final  . AST 12/01/2015 25  15 - 41 U/L Final  . ALT 12/01/2015 22  17 - 63 U/L Final  . Alkaline Phosphatase 12/01/2015 50  38 - 126 U/L Final  . Total Bilirubin 12/01/2015 0.6  0.3 - 1.2 mg/dL Final  . GFR calc non Af Amer 12/01/2015 >60  >60 mL/min Final  . GFR calc Af Amer 12/01/2015 >60  >60 mL/min Final   Comment: (NOTE) The eGFR has been calculated using the CKD EPI equation. This calculation has not been validated in all clinical situations. eGFR's persistently <60 mL/min signify possible Chronic Kidney Disease.   Georgiann Hahn gap 12/01/2015 10  5 - 15 Final    Assessment:  Muath Hallam. is a 71 y.o. male with stage III multiple myeloma. He presented in 02/2013 with left-sided sharp pain. Evaluation  revealed wide-spread lytic lesions including fracture of the left 5th rib laterally. CT scans on 02/22/2013 showed  innumerable lytic lesions in thoracic spine, sternum, clavicle, scapula, and ribs.   Bone marrow biopsy on 03/13/2013 revealed 15 to 20% plasma cells. Iron stores were absent. Renal function was normal. SPEP on 03/01/2013 revealed a 1.4 gm/dL IgG monoclonal lambda. IgG was 1987.   He began induction with Revlimid 25 mg a day (3 weeks on and 1 week off) and Decadron (40 mg once a week) on 04/09/2013. SPEP has revealed no monoclonal protein since 07/26/2014 (last check 12/01/2015). IgG was 1362 on 07/26/2014 and 1097 on 12/19/2014. Light chains have been monitored: lambda free light chains 33.20 (ratio of 1.56) on 09/20/2014, 31.51 (ratio of 1.43) on 12/19/2014, and 30.25 (ratio of 1.56) on 05/01/2015.  With remission, Revlimid was decreased to 10 mg a day then to 10 mg every other day secondary to issues with renal function and diarrhea.   Bone survey on 05/30/2015 revealed the majority of small lytic lesions were stable.  There was possibly new lesion in the distal left clavicle and right mid femur (small) and a possible developing lucency in the proximal left femoral shaft.  The calvarial lesion noted on the prior study was not well seen (? positional).  Bone survey on 11/27/2015 revealed widespread bony lytic lesions consistent with multiple myeloma.  The vast majority of lesions were stable.  A few small lesions were not previously seen.  One lesion was previously obscured by bowel contents.  Two small lesions in the right distal femoral diaphysis appeared new.  He receives Zometa every 3 months (last 10/06/2015). He takes calcium 600 mg BID. He receives B12 monthly (last 11/04/2015). B12 was 370 on 01/17/2015.  His diarrhea is better.  Abdominal and pelvic CT scan on 07/15/2015 revealed  extensive sigmoid diverticulosis. There was questionable wall thickening of the  ascending colon versus artifact from incomplete distention.  Colonoscopy on 11/11/2015 revealed diverticulosis in the sigmoid colon and distal descending colon and ascending colon.  There was a 2 mm polyp in the mid sigmoid colon.  Pathology revealed a hyperplastic polyp, negative for dysplasia or malignancy.  Symptomatically, he is doing well.  Exam is stable.  SPEP reveals no monoclonal protein today.  Plan: 1.  Labs today:  CBC with diff, CMP, SPEP. 2.  Collect 24 hour urine for UPEP with immunofixation. 3.  Continue Revlimid. 4.  B12 today and monthly. 5.  Discuss follow-up bone survey in 6 months (05/26/2016) secondary to questionable new lesions. 6.  RTC on 12/29/2015 for MD assessment, labs (CBC with diff, CMP, FLCA), and Zometa.   Lequita Asal, MD  12/01/2015, 3:17 PM

## 2015-12-02 DIAGNOSIS — C9001 Multiple myeloma in remission: Secondary | ICD-10-CM | POA: Diagnosis not present

## 2015-12-02 LAB — PROTEIN ELECTROPHORESIS, SERUM
A/G Ratio: 1 (ref 0.7–1.7)
Albumin ELP: 3.7 g/dL (ref 2.9–4.4)
Alpha-1-Globulin: 0.2 g/dL (ref 0.0–0.4)
Alpha-2-Globulin: 0.9 g/dL (ref 0.4–1.0)
Beta Globulin: 1.3 g/dL (ref 0.7–1.3)
Gamma Globulin: 1.3 g/dL (ref 0.4–1.8)
Globulin, Total: 3.7 g/dL (ref 2.2–3.9)
Total Protein ELP: 7.4 g/dL (ref 6.0–8.5)

## 2015-12-03 ENCOUNTER — Other Ambulatory Visit: Payer: Self-pay

## 2015-12-03 DIAGNOSIS — C9000 Multiple myeloma not having achieved remission: Secondary | ICD-10-CM

## 2015-12-04 LAB — UPEP/TP, 24-HR URINE
Albumin, U: 100 %
Alpha 1, Urine: 0 %
Alpha 2, Urine: 0 %
Beta, Urine: 0 %
Gamma Globulin, Urine: 0 %
Total Protein, Urine-Ur/day: 113.6 mg/24 hr (ref 30.0–150.0)
Total Protein, Urine: 14.2 mg/dL
Total Volume: 800

## 2015-12-07 ENCOUNTER — Encounter: Payer: Self-pay | Admitting: Hematology and Oncology

## 2016-01-01 ENCOUNTER — Ambulatory Visit: Payer: BLUE CROSS/BLUE SHIELD

## 2016-01-01 ENCOUNTER — Other Ambulatory Visit: Payer: Self-pay | Admitting: Hematology and Oncology

## 2016-01-01 ENCOUNTER — Inpatient Hospital Stay: Payer: BLUE CROSS/BLUE SHIELD

## 2016-01-01 ENCOUNTER — Inpatient Hospital Stay (HOSPITAL_BASED_OUTPATIENT_CLINIC_OR_DEPARTMENT_OTHER): Payer: BLUE CROSS/BLUE SHIELD | Admitting: Hematology and Oncology

## 2016-01-01 ENCOUNTER — Inpatient Hospital Stay: Payer: BLUE CROSS/BLUE SHIELD | Attending: Hematology and Oncology

## 2016-01-01 VITALS — BP 143/81 | HR 65 | Temp 98.2°F | Resp 18 | Ht 67.0 in | Wt 203.9 lb

## 2016-01-01 DIAGNOSIS — K5730 Diverticulosis of large intestine without perforation or abscess without bleeding: Secondary | ICD-10-CM | POA: Diagnosis not present

## 2016-01-01 DIAGNOSIS — K5900 Constipation, unspecified: Secondary | ICD-10-CM | POA: Insufficient documentation

## 2016-01-01 DIAGNOSIS — I10 Essential (primary) hypertension: Secondary | ICD-10-CM | POA: Diagnosis not present

## 2016-01-01 DIAGNOSIS — K219 Gastro-esophageal reflux disease without esophagitis: Secondary | ICD-10-CM

## 2016-01-01 DIAGNOSIS — E785 Hyperlipidemia, unspecified: Secondary | ICD-10-CM

## 2016-01-01 DIAGNOSIS — F329 Major depressive disorder, single episode, unspecified: Secondary | ICD-10-CM | POA: Diagnosis not present

## 2016-01-01 DIAGNOSIS — F419 Anxiety disorder, unspecified: Secondary | ICD-10-CM | POA: Diagnosis not present

## 2016-01-01 DIAGNOSIS — I252 Old myocardial infarction: Secondary | ICD-10-CM | POA: Insufficient documentation

## 2016-01-01 DIAGNOSIS — G4730 Sleep apnea, unspecified: Secondary | ICD-10-CM | POA: Insufficient documentation

## 2016-01-01 DIAGNOSIS — E119 Type 2 diabetes mellitus without complications: Secondary | ICD-10-CM | POA: Diagnosis not present

## 2016-01-01 DIAGNOSIS — E538 Deficiency of other specified B group vitamins: Secondary | ICD-10-CM

## 2016-01-01 DIAGNOSIS — C9000 Multiple myeloma not having achieved remission: Secondary | ICD-10-CM | POA: Insufficient documentation

## 2016-01-01 DIAGNOSIS — Z7982 Long term (current) use of aspirin: Secondary | ICD-10-CM

## 2016-01-01 DIAGNOSIS — Z79899 Other long term (current) drug therapy: Secondary | ICD-10-CM

## 2016-01-01 DIAGNOSIS — I2510 Atherosclerotic heart disease of native coronary artery without angina pectoris: Secondary | ICD-10-CM

## 2016-01-01 DIAGNOSIS — I509 Heart failure, unspecified: Secondary | ICD-10-CM | POA: Insufficient documentation

## 2016-01-01 DIAGNOSIS — M4800 Spinal stenosis, site unspecified: Secondary | ICD-10-CM | POA: Insufficient documentation

## 2016-01-01 DIAGNOSIS — D125 Benign neoplasm of sigmoid colon: Secondary | ICD-10-CM

## 2016-01-01 DIAGNOSIS — Z7984 Long term (current) use of oral hypoglycemic drugs: Secondary | ICD-10-CM | POA: Insufficient documentation

## 2016-01-01 DIAGNOSIS — I209 Angina pectoris, unspecified: Secondary | ICD-10-CM | POA: Insufficient documentation

## 2016-01-01 DIAGNOSIS — C9001 Multiple myeloma in remission: Secondary | ICD-10-CM

## 2016-01-01 DIAGNOSIS — M129 Arthropathy, unspecified: Secondary | ICD-10-CM | POA: Diagnosis not present

## 2016-01-01 DIAGNOSIS — Z87891 Personal history of nicotine dependence: Secondary | ICD-10-CM | POA: Diagnosis not present

## 2016-01-01 LAB — CBC WITH DIFFERENTIAL/PLATELET
Basophils Absolute: 0.1 10*3/uL (ref 0–0.1)
Basophils Relative: 3 %
Eosinophils Absolute: 0.4 10*3/uL (ref 0–0.7)
Eosinophils Relative: 11 %
HCT: 38.7 % — ABNORMAL LOW (ref 40.0–52.0)
Hemoglobin: 13.6 g/dL (ref 13.0–18.0)
Lymphocytes Relative: 38 %
Lymphs Abs: 1.3 10*3/uL (ref 1.0–3.6)
MCH: 36.1 pg — ABNORMAL HIGH (ref 26.0–34.0)
MCHC: 35.3 g/dL (ref 32.0–36.0)
MCV: 102.4 fL — ABNORMAL HIGH (ref 80.0–100.0)
Monocytes Absolute: 0.6 10*3/uL (ref 0.2–1.0)
Monocytes Relative: 17 %
Neutro Abs: 1.1 10*3/uL — ABNORMAL LOW (ref 1.4–6.5)
Neutrophils Relative %: 31 %
Platelets: 201 10*3/uL (ref 150–440)
RBC: 3.78 MIL/uL — ABNORMAL LOW (ref 4.40–5.90)
RDW: 13.8 % (ref 11.5–14.5)
WBC: 3.5 10*3/uL — ABNORMAL LOW (ref 3.8–10.6)

## 2016-01-01 LAB — COMPREHENSIVE METABOLIC PANEL
ALT: 24 U/L (ref 17–63)
AST: 26 U/L (ref 15–41)
Albumin: 4 g/dL (ref 3.5–5.0)
Alkaline Phosphatase: 53 U/L (ref 38–126)
Anion gap: 9 (ref 5–15)
BUN: 16 mg/dL (ref 6–20)
CO2: 22 mmol/L (ref 22–32)
Calcium: 8.7 mg/dL — ABNORMAL LOW (ref 8.9–10.3)
Chloride: 105 mmol/L (ref 101–111)
Creatinine, Ser: 0.84 mg/dL (ref 0.61–1.24)
GFR calc Af Amer: >60 mL/min (ref >60)
GFR calc non Af Amer: >60 mL/min (ref >60)
Glucose, Bld: 145 mg/dL — ABNORMAL HIGH (ref 65–99)
Potassium: 4 mmol/L (ref 3.5–5.1)
Sodium: 136 mmol/L (ref 135–145)
Total Bilirubin: 0.5 mg/dL (ref 0.3–1.2)
Total Protein: 8.1 g/dL (ref 6.5–8.1)

## 2016-01-01 MED ORDER — CYANOCOBALAMIN 1000 MCG/ML IJ SOLN
1000.0000 ug | Freq: Once | INTRAMUSCULAR | Status: AC
  Administered 2016-01-01: 1000 ug via INTRAMUSCULAR
  Filled 2016-01-01: qty 1

## 2016-01-01 NOTE — Progress Notes (Signed)
[facility] day:  01/01/2016   Chief Complaint: Luis [name]. is a 70 y.o. male with multiple myeloma on Revlimid who is seen for 2 month assessment on Revlimid prior to monthly B12.  HPI: The patient was last seen in the medical oncology clinic on 12/01/2015.  At that time, he was was seen for 2 month assessment on Revlimid and prior to monthly B12.  Symptomatically, he was doing well except for issues with spinal stenosis.  Exam was stable.  SPEP reveals no monoclonal protein.  Follow-up 24 hour UPEP on 12/02/2015 revealed no monoclonal protein.  During the interim, he has been doing well.  He is doing some projects.  He has had some constipation alternating with diarrhea.  He states that he may have missed taking his calcium a few times.  He denies any bone pain or infections.  Past Medical History  Diagnosis Date  . Hypertension   . Spinal stenosis   . GERD (gastroesophageal reflux disease)   . CHF (congestive heart failure) ([place])   . Arthritis   . Angina pectoris ([place])   . Hyperlipidemia   . Multiple myeloma ([place])   . Multiple myeloma ([place]) 03/27/2015  . Diabetes mellitus without complication [facility])     Patient takes Metformin.  . Sleep apnea     No CPAP @ present  . Shortness of breath dyspnea   . Anxiety   . Depression   . Coronary artery disease   . Diverticulosis   . Myocardial infarction [facility]) April 2001    widowmaker    Past Surgical History  Procedure Laterality Date  . Rotator cuff repair    . Coronary artery bypass graft    . Pilonidal cyst excision    . Total hip arthroplasty Right   . Cataract extraction    . Inguinal hernia repair    . Carpal tunnel release    . Cardiac catheterization    . Joint replacement Right 2008    Right Total Hip Replacement  . Eye surgery Bilateral     Cataract Extraction  . Ventral hernia repair N/A 08/15/2015    Procedure: VENTRAL HERNIA REPAIR WITH MESH ;  Surgeon: Leonie Green, MD;  Location: [facility] ORS;  Service: General;  Laterality: N/A;  . Colonoscopy with propofol N/A 11/11/2015    Procedure: COLONOSCOPY WITH PROPOFOL;  Surgeon: Lollie Sails, MD;  Location: [facility] ENDOSCOPY;  Service: Endoscopy;  Laterality: N/A;    Family History  Problem Relation Age of Onset  . Heart disease Father   . Stroke Mother   . Prostate cancer Maternal Grandfather 49    Social History:  reports that he quit smoking about 15 years ago. His smoking use included Cigarettes. He has a 60 pack-year smoking history. He quit smokeless tobacco use about 15 years ago. He reports that he does not drink alcohol or use illicit drugs.  He notes that his wife goes off to work.  He has projects that he likes to do at home.  The patient is alone today.  Allergies:  Allergies  Allergen Reactions  . Pravachol [Pravastatin]   . Pravastatin Sodium Other (See Comments)  . Statins Other (See Comments)    Muscle aches  . Zocor [Simvastatin] Other (See Comments)    Muscle aches    Current Medications: Current Outpatient Prescriptions  Medication Sig Dispense Refill  . ALPRAZolam (XANAX) 0.25 MG tablet Take 0.25 mg by mouth at bedtime  as needed. for sleep  3  . aspirin 81 MG tablet Take 81 mg by mouth daily.     [name] Pollen 500 MG CHEW Chew 1 tablet by mouth daily.    . Calcium Carb-Cholecalciferol 600-200 MG-UNIT TABS Take 1 tablet by mouth daily.    . ergocalciferol (VITAMIN D2) 50000 UNITS capsule Take 50,000 Units by mouth once a week.    Marland Kitchen HYDROcodone-acetaminophen (NORCO) 5-325 MG tablet Take 1-2 tablets by mouth every 4 (four) hours as needed for moderate pain. 20 tablet 0  . KLOR-CON M20 20 MEQ tablet TAKE 1 TABLET (20 MEQ TOTAL) BY MOUTH DAILY. 30 tablet 2  . lenalidomide (REVLIMID) 10 MG capsule Take 1 capsule (10 mg total) by mouth every other day. Auth # [id number] 14 capsule 0  . lisinopril (PRINIVIL,ZESTRIL) 20 MG tablet Take 1 tablet by mouth daily.    Marland Kitchen lovastatin (MEVACOR)  40 MG tablet Take 40 mg by mouth at bedtime.    . metFORMIN (GLUCOPHAGE) 500 MG tablet Take 500 mg by mouth daily with breakfast.    . omeprazole (PRILOSEC) 20 MG capsule Take 20 mg by mouth daily.    . traMADol (ULTRAM) 50 MG tablet TAKE 1 TABLET 3 TIMES A DAY AS NEEDED 60 tablet 0  . vitamin B-12 (CYANOCOBALAMIN) 1000 MCG tablet Take 1,000 mcg by mouth. 2-3 times a week    . zolendronic acid 4 mg in sodium chloride 0.9 % 100 mL Inject 4 mg into the vein every 30 (thirty) days.     No current facility-administered medications for this visit.    Review of Systems:  GENERAL:  Feels good. No fevers or sweats.  Weight stable. PERFORMANCE STATUS (ECOG): 1 HEENT: No visual changes, runny nose, sore throat, mouth sores or tenderness. Lungs: No shortness of breath or cough. No hemoptysis. Cardiac: No chest pain, palpitations, orthopnea, or PND.  Pre-op echo and stress test good. GI: Diarrhea alternating with constipation. No nausea, vomiting, constipation, melena or hematochezia. GU: No urgency, frequency, dysuria, or hematuria. Musculoskeletal: Spinal stenosis. No joint pain. No muscle tenderness. Extremities: No pain or swelling. Skin: No rashes or skin changes. Neuro: No headache, numbness or weakness, balance or coordination issues. Endocrine: Diabetes.  No tthyroid issues, hot flashes or night sweats. Psych: No mood changes, depression or anxiety. Pain: No focal pain. Review of systems: All other systems reviewed and found to be negative.  Physical Exam: Blood pressure 143/81, pulse 65, temperature 98.2 F (36.8 C), temperature source Tympanic, resp. rate 18, height '5\' 7"'  (1.702 m), weight 203 lb 14.8 oz (92.5 kg).  GENERAL: Well developed, well nourished, sitting comfortably in the exam room in no acute distress. MENTAL STATUS: Alert and oriented to person, place and time. HEAD: Short gray hair. Male pattern baldness. Normocephalic, atraumatic, face symmetric, no  Cushingoid features. EYES: Blue eyes. Pupils equal round and reactive to light and accomodation. No conjunctivitis or scleral icterus. ENT: Oropharynx clear without lesion. Tongue normal. Mucous membranes moist.  RESPIRATORY: Clear to auscultation without rales, wheezes or rhonchi. CARDIOVASCULAR: Regular rate and rhythm without murmur, rub or gallop. ABDOMEN: Soft, non-tender, with active bowel sounds, and no hepatosplenomegaly. No masses. SKIN: No rashes, ulcers or lesions. EXTREMITIES: No edema, no skin discoloration or tenderness. No palpable cords. LYMPH NODES: No palpable cervical, supraclavicular, axillary or inguinal adenopathy  NEUROLOGICAL: Unremarkable. PSYCH: Appropriate  Appointment on 01/01/2016  Component Date Value Ref Range Status  . WBC 01/01/2016 3.5* 3.8 - 10.6 K/uL Final  . RBC  01/01/2016 3.78* 4.40 - 5.90 MIL/uL Final  . Hemoglobin 01/01/2016 13.6  13.0 - 18.0 g/dL Final  . HCT 01/01/2016 38.7* 40.0 - 52.0 % Final  . MCV 01/01/2016 102.4* 80.0 - 100.0 fL Final  . MCH 01/01/2016 36.1* 26.0 - 34.0 pg Final  . MCHC 01/01/2016 35.3  32.0 - 36.0 g/dL Final  . RDW 01/01/2016 13.8  11.5 - 14.5 % Final  . Platelets 01/01/2016 201  150 - 440 K/uL Final  . Neutrophils Relative % 01/01/2016 31   Final  . Neutro Abs 01/01/2016 1.1* 1.4 - 6.5 K/uL Final  . Lymphocytes Relative 01/01/2016 38   Final  . Lymphs Abs 01/01/2016 1.3  1.0 - 3.6 K/uL Final  . Monocytes Relative 01/01/2016 17   Final  . Monocytes Absolute 01/01/2016 0.6  0.2 - 1.0 K/uL Final  . Eosinophils Relative 01/01/2016 11   Final  . Eosinophils Absolute 01/01/2016 0.4  0 - 0.7 K/uL Final  . Basophils Relative 01/01/2016 3   Final  . Basophils Absolute 01/01/2016 0.1  0 - 0.1 K/uL Final  . Sodium 01/01/2016 136  135 - 145 mmol/L Final  . Potassium 01/01/2016 4.0  3.5 - 5.1 mmol/L Final  . Chloride 01/01/2016 105  101 - 111 mmol/L Final  . CO2 01/01/2016 22  22 - 32 mmol/L Final  . Glucose, Bld  01/01/2016 145* 65 - 99 mg/dL Final  . BUN 01/01/2016 16  6 - 20 mg/dL Final  . Creatinine, Ser 01/01/2016 0.84  0.61 - 1.24 mg/dL Final  . Calcium 01/01/2016 8.7* 8.9 - 10.3 mg/dL Final  . Total Protein 01/01/2016 8.1  6.5 - 8.1 g/dL Final  . Albumin 01/01/2016 4.0  3.5 - 5.0 g/dL Final  . AST 01/01/2016 26  15 - 41 U/L Final  . ALT 01/01/2016 24  17 - 63 U/L Final  . Alkaline Phosphatase 01/01/2016 53  38 - 126 U/L Final  . Total Bilirubin 01/01/2016 0.5  0.3 - 1.2 mg/dL Final  . GFR calc non Af Amer 01/01/2016 >60  >60 mL/min Final  . GFR calc Af Amer 01/01/2016 >60  >60 mL/min Final   Comment: (NOTE) The eGFR has been calculated using the CKD EPI equation. This calculation has not been validated in all clinical situations. eGFR's persistently <60 mL/min signify possible Chronic Kidney Disease.   Georgiann Hahn gap 01/01/2016 9  5 - 15 Final    Assessment:  Luis [name]. is a 70 y.o. male with stage III multiple myeloma. He presented in 02/2013 with left-sided sharp pain. Evaluation revealed wide-spread lytic lesions including fracture of the left 5th rib laterally. CT scans on 02/22/2013 showed innumerable lytic lesions in thoracic spine, sternum, clavicle, scapula, and ribs.   Bone marrow biopsy on 03/13/2013 revealed 15 to 20% plasma cells. Iron stores were absent. Renal function was normal. SPEP on 03/01/2013 revealed a 1.4 gm/dL IgG monoclonal lambda. IgG was 1987.   He began induction with Revlimid 25 mg a day (3 weeks on and 1 week off) and Decadron (40 mg once a week) on 04/09/2013. SPEP has revealed no monoclonal protein since 07/26/2014 (last check 12/01/2015). IgG was 1362 on 07/26/2014 and 1097 on 12/19/2014. Light chains have been monitored: lambda free light chains 33.20 (ratio of 1.56) on 09/20/2014, 31.51 (ratio of 1.43) on 12/19/2014, 30.25 (ratio of 1.56) on 05/01/2015, and 47.75 (ratio 1.49) on 01/01/2016.  24 hour UPEP on 12/02/2015 revealed no monoclonal  protein.  With remission, Revlimid was  decreased to 10 mg a day then to 10 mg every other day secondary to issues with renal function and diarrhea.   Bone survey on 05/30/2015 revealed the majority of small lytic lesions were stable.  There was possibly new lesion in the distal left clavicle and right mid femur (small) and a possible developing lucency in the proximal left femoral shaft.  The calvarial lesion noted on the prior study was not well seen (? positional).  Bone survey on 11/27/2015 revealed widespread bony lytic lesions consistent with multiple myeloma.  The vast majority of lesions were stable.  A few small lesions were not previously seen.  One lesion was previously obscured by bowel contents.  Two small lesions in the right distal femoral diaphysis appeared new.  He receives Zometa every 3 months (last 10/06/2015). He takes calcium 600 mg BID. He receives B12 monthly (last 12/01/2015). B12 was 370 on 01/17/2015.  Abdominal and pelvic CT scan on 07/15/2015 revealed  extensive sigmoid diverticulosis. There was questionable wall thickening of the ascending colon versus artifact from incomplete distention.  Colonoscopy on 11/11/2015 revealed diverticulosis in the sigmoid colon and distal descending colon and ascending colon.  There was a 2 mm polyp in the mid sigmoid colon.  Pathology revealed a hyperplastic polyp, negative for dysplasia or malignancy.  Symptomatically, he is doing well.  Exam is stable.  He has mild hypocalcemia.  Plan: 1.  Labs today:  CBC with diff, CMP, FLCA. 2.  Continue Revlimid 10 mg QOD. 3.  B12 today and monthly. 4.  Discuss postponing Zometa secondary to low calcium. 5.  Anticipate follow-up bone survey in 6 months (05/26/2016) secondary to questionable new lesions. 6.  RTC in 1 month for MD assessment, labs (CBC with diff, CMP, SPEP), and Zometa.   Lequita Asal, MD  01/01/2016, 2:00 PM

## 2016-01-02 ENCOUNTER — Other Ambulatory Visit: Payer: Self-pay

## 2016-01-02 DIAGNOSIS — C9000 Multiple myeloma not having achieved remission: Secondary | ICD-10-CM

## 2016-01-02 LAB — KAPPA/LAMBDA LIGHT CHAINS
Kappa free light chain: 47.75 mg/L — ABNORMAL HIGH (ref 3.30–19.40)
Kappa, lambda light chain ratio: 1.49 (ref 0.26–1.65)
Lambda free light chains: 31.95 mg/L — ABNORMAL HIGH (ref 5.71–26.30)

## 2016-01-05 ENCOUNTER — Encounter: Payer: Self-pay | Admitting: Hematology and Oncology

## 2016-01-19 ENCOUNTER — Other Ambulatory Visit: Payer: Self-pay | Admitting: Hematology and Oncology

## 2016-01-24 ENCOUNTER — Other Ambulatory Visit: Payer: Self-pay | Admitting: Hematology and Oncology

## 2016-01-29 ENCOUNTER — Inpatient Hospital Stay: Payer: BLUE CROSS/BLUE SHIELD | Attending: Hematology and Oncology

## 2016-01-29 DIAGNOSIS — Z79899 Other long term (current) drug therapy: Secondary | ICD-10-CM | POA: Diagnosis not present

## 2016-01-29 DIAGNOSIS — E538 Deficiency of other specified B group vitamins: Secondary | ICD-10-CM | POA: Diagnosis present

## 2016-01-29 MED ORDER — CYANOCOBALAMIN 1000 MCG/ML IJ SOLN
1000.0000 ug | Freq: Once | INTRAMUSCULAR | Status: AC
  Administered 2016-01-29: 1000 ug via INTRAMUSCULAR
  Filled 2016-01-29: qty 1

## 2016-02-02 ENCOUNTER — Other Ambulatory Visit: Payer: Self-pay | Admitting: Hematology and Oncology

## 2016-02-02 ENCOUNTER — Inpatient Hospital Stay: Payer: BLUE CROSS/BLUE SHIELD

## 2016-02-02 ENCOUNTER — Inpatient Hospital Stay: Payer: BLUE CROSS/BLUE SHIELD | Admitting: Hematology and Oncology

## 2016-02-04 ENCOUNTER — Other Ambulatory Visit: Payer: Self-pay | Admitting: Hematology and Oncology

## 2016-02-05 NOTE — Telephone Encounter (Signed)
Brandy spoke to [place] and did the refill electronically and got the number for he celgene auth and sent it to [facility] specialty pharmacy

## 2016-02-12 ENCOUNTER — Other Ambulatory Visit: Payer: Self-pay | Admitting: Hematology and Oncology

## 2016-02-12 ENCOUNTER — Inpatient Hospital Stay: Payer: BLUE CROSS/BLUE SHIELD

## 2016-02-12 ENCOUNTER — Inpatient Hospital Stay (HOSPITAL_BASED_OUTPATIENT_CLINIC_OR_DEPARTMENT_OTHER): Payer: BLUE CROSS/BLUE SHIELD | Admitting: Hematology and Oncology

## 2016-02-12 ENCOUNTER — Encounter: Payer: Self-pay | Admitting: Hematology and Oncology

## 2016-02-12 ENCOUNTER — Telehealth: Payer: Self-pay | Admitting: *Deleted

## 2016-02-12 ENCOUNTER — Encounter: Payer: Self-pay | Admitting: *Deleted

## 2016-02-12 ENCOUNTER — Inpatient Hospital Stay: Payer: BLUE CROSS/BLUE SHIELD | Attending: Hematology and Oncology

## 2016-02-12 VITALS — BP 120/74 | HR 61 | Temp 96.6°F | Resp 18 | Wt 206.1 lb

## 2016-02-12 DIAGNOSIS — I209 Angina pectoris, unspecified: Secondary | ICD-10-CM | POA: Insufficient documentation

## 2016-02-12 DIAGNOSIS — Z7982 Long term (current) use of aspirin: Secondary | ICD-10-CM

## 2016-02-12 DIAGNOSIS — C9001 Multiple myeloma in remission: Secondary | ICD-10-CM

## 2016-02-12 DIAGNOSIS — K5790 Diverticulosis of intestine, part unspecified, without perforation or abscess without bleeding: Secondary | ICD-10-CM | POA: Insufficient documentation

## 2016-02-12 DIAGNOSIS — F418 Other specified anxiety disorders: Secondary | ICD-10-CM | POA: Insufficient documentation

## 2016-02-12 DIAGNOSIS — I252 Old myocardial infarction: Secondary | ICD-10-CM | POA: Insufficient documentation

## 2016-02-12 DIAGNOSIS — Z808 Family history of malignant neoplasm of other organs or systems: Secondary | ICD-10-CM | POA: Diagnosis not present

## 2016-02-12 DIAGNOSIS — K219 Gastro-esophageal reflux disease without esophagitis: Secondary | ICD-10-CM

## 2016-02-12 DIAGNOSIS — Z87891 Personal history of nicotine dependence: Secondary | ICD-10-CM | POA: Diagnosis not present

## 2016-02-12 DIAGNOSIS — R0602 Shortness of breath: Secondary | ICD-10-CM | POA: Diagnosis not present

## 2016-02-12 DIAGNOSIS — M129 Arthropathy, unspecified: Secondary | ICD-10-CM | POA: Diagnosis not present

## 2016-02-12 DIAGNOSIS — Z7984 Long term (current) use of oral hypoglycemic drugs: Secondary | ICD-10-CM

## 2016-02-12 DIAGNOSIS — E538 Deficiency of other specified B group vitamins: Secondary | ICD-10-CM | POA: Insufficient documentation

## 2016-02-12 DIAGNOSIS — Z79899 Other long term (current) drug therapy: Secondary | ICD-10-CM | POA: Diagnosis not present

## 2016-02-12 DIAGNOSIS — E119 Type 2 diabetes mellitus without complications: Secondary | ICD-10-CM

## 2016-02-12 DIAGNOSIS — C9000 Multiple myeloma not having achieved remission: Secondary | ICD-10-CM | POA: Diagnosis present

## 2016-02-12 DIAGNOSIS — I2510 Atherosclerotic heart disease of native coronary artery without angina pectoris: Secondary | ICD-10-CM | POA: Insufficient documentation

## 2016-02-12 DIAGNOSIS — I509 Heart failure, unspecified: Secondary | ICD-10-CM | POA: Insufficient documentation

## 2016-02-12 DIAGNOSIS — G4730 Sleep apnea, unspecified: Secondary | ICD-10-CM | POA: Diagnosis not present

## 2016-02-12 DIAGNOSIS — E785 Hyperlipidemia, unspecified: Secondary | ICD-10-CM | POA: Insufficient documentation

## 2016-02-12 DIAGNOSIS — M4800 Spinal stenosis, site unspecified: Secondary | ICD-10-CM

## 2016-02-12 LAB — COMPREHENSIVE METABOLIC PANEL
ALT: 28 U/L (ref 17–63)
AST: 34 U/L (ref 15–41)
Albumin: 3.8 g/dL (ref 3.5–5.0)
Alkaline Phosphatase: 53 U/L (ref 38–126)
Anion gap: 5 (ref 5–15)
BUN: 14 mg/dL (ref 6–20)
CO2: 25 mmol/L (ref 22–32)
Calcium: 8.5 mg/dL — ABNORMAL LOW (ref 8.9–10.3)
Chloride: 105 mmol/L (ref 101–111)
Creatinine, Ser: 0.86 mg/dL (ref 0.61–1.24)
GFR calc Af Amer: >60 mL/min (ref >60)
GFR calc non Af Amer: >60 mL/min (ref >60)
Glucose, Bld: 134 mg/dL — ABNORMAL HIGH (ref 65–99)
Potassium: 3.8 mmol/L (ref 3.5–5.1)
Sodium: 135 mmol/L (ref 135–145)
Total Bilirubin: 0.6 mg/dL (ref 0.3–1.2)
Total Protein: 7.8 g/dL (ref 6.5–8.1)

## 2016-02-12 LAB — CBC WITH DIFFERENTIAL/PLATELET
Basophils Absolute: 0.1 10*3/uL (ref 0–0.1)
Basophils Relative: 4 %
Eosinophils Absolute: 0.3 10*3/uL (ref 0–0.7)
Eosinophils Relative: 9 %
HCT: 37 % — ABNORMAL LOW (ref 40.0–52.0)
Hemoglobin: 13.2 g/dL (ref 13.0–18.0)
Lymphocytes Relative: 38 %
Lymphs Abs: 1.1 10*3/uL (ref 1.0–3.6)
MCH: 36.2 pg — ABNORMAL HIGH (ref 26.0–34.0)
MCHC: 35.6 g/dL (ref 32.0–36.0)
MCV: 101.6 fL — ABNORMAL HIGH (ref 80.0–100.0)
Monocytes Absolute: 0.5 10*3/uL (ref 0.2–1.0)
Monocytes Relative: 16 %
Neutro Abs: 1 10*3/uL — ABNORMAL LOW (ref 1.4–6.5)
Neutrophils Relative %: 33 %
Platelets: 158 10*3/uL (ref 150–440)
RBC: 3.65 MIL/uL — ABNORMAL LOW (ref 4.40–5.90)
RDW: 13.7 % (ref 11.5–14.5)
WBC: 3 10*3/uL — ABNORMAL LOW (ref 3.8–10.6)

## 2016-02-12 NOTE — Progress Notes (Signed)
F/U MM today. Has spinal stenosis causing his waist and legs to ache. Appetite is good. Trying to lose weight. Energy is fair to low. Has dyspnea with exertion. Started going to gym for exercise 3 days per week.

## 2016-02-12 NOTE — Telephone Encounter (Signed)
I called pt to let him know his ANC is 1000 , WBC is low. Instructions given for neutropenic precautions. He is to call MD for any fever. He is to hold his Revlimid for 1 week and until after F/U blood work in 1 week and MD re evaluates blood results. Our scheduler will call him with that lab appt. Pt voices his understanding.

## 2016-02-12 NOTE — Progress Notes (Signed)
[facility] day:  02/12/2016   Chief Complaint: Luis [name]. is a 70 y.o. male with multiple myeloma on Revlimid who is seen for 2 month assessment on Revlimid prior to monthly B12.  HPI: The patient was last seen in the medical oncology clinic on 01/01/2016.  At that time, he was was seen for 2 month assessment on Revlimid and prior to monthly B12.  He was doing well.  Exam was stable.  He had mild hypocalcemia.  Zometa was held.  Free light chain ratio was 1.49 (normal).  He received B12 on 01/29/2016.  During the interim, he has had issues with spinal stenosis.  He states that his legs ache at night and he takes Tylenol.  Appetite is good.  He is trying to lose weight.  He is going to the gym 3 times a week.  He denies any issues with infections.  Past Medical History  Diagnosis Date  . Hypertension   . Spinal stenosis   . GERD (gastroesophageal reflux disease)   . CHF (congestive heart failure) ([place])   . Arthritis   . Angina pectoris ([place])   . Hyperlipidemia   . Multiple myeloma ([place])   . Multiple myeloma ([place]) 03/27/2015  . Diabetes mellitus without complication [facility])     Patient takes Metformin.  . Sleep apnea     No CPAP @ present  . Shortness of breath dyspnea   . Anxiety   . Depression   . Coronary artery disease   . Diverticulosis   . Myocardial infarction [facility]) April 2001    widowmaker    Past Surgical History  Procedure Laterality Date  . Rotator cuff repair    . Coronary artery bypass graft    . Pilonidal cyst excision    . Total hip arthroplasty Right   . Cataract extraction    . Inguinal hernia repair    . Carpal tunnel release    . Cardiac catheterization    . Joint replacement Right 2008    Right Total Hip Replacement  . Eye surgery Bilateral     Cataract Extraction  . Ventral hernia repair N/A 08/15/2015    Procedure: VENTRAL HERNIA REPAIR WITH MESH ;  Surgeon: Leonie Green, MD;  Location: [facility]  ORS;  Service: General;  Laterality: N/A;  . Colonoscopy with propofol N/A 11/11/2015    Procedure: COLONOSCOPY WITH PROPOFOL;  Surgeon: Lollie Sails, MD;  Location: [facility] ENDOSCOPY;  Service: Endoscopy;  Laterality: N/A;    Family History  Problem Relation Age of Onset  . Heart disease Father   . Stroke Mother   . Prostate cancer Maternal Grandfather 76    Social History:  reports that he quit smoking about 15 years ago. His smoking use included Cigarettes. He has a 60 pack-year smoking history. He quit smokeless tobacco use about 15 years ago. He reports that he does not drink alcohol or use illicit drugs.  He notes that his wife goes off to work.  He has projects that he likes to do at home.  He shows pictures of his wood working today in clinic.The patient is alone today.  Allergies:  Allergies  Allergen Reactions  . Pravachol [Pravastatin]   . Pravastatin Sodium Other (See Comments)  . Statins Other (See Comments)    Muscle aches  . Zocor [Simvastatin] Other (See Comments)    Muscle aches    Current Medications: Current Outpatient Prescriptions  Medication Sig Dispense  Refill  . ALPRAZolam (XANAX) 0.25 MG tablet Take 0.25 mg by mouth at bedtime as needed. for sleep  3  . aspirin 81 MG tablet Take 81 mg by mouth daily.     [name] Pollen 500 MG CHEW Chew 1 tablet by mouth daily.    . Calcium Carb-Cholecalciferol 600-200 MG-UNIT TABS Take 1 tablet by mouth daily.    . ergocalciferol (VITAMIN D2) 50000 UNITS capsule Take 50,000 Units by mouth once a week.    Marland Kitchen KLOR-CON M20 20 MEQ tablet TAKE 1 TABLET (20 MEQ TOTAL) BY MOUTH DAILY. 30 tablet 2  . lisinopril (PRINIVIL,ZESTRIL) 20 MG tablet Take 1 tablet by mouth daily.    Marland Kitchen lovastatin (MEVACOR) 40 MG tablet Take 40 mg by mouth at bedtime.    . metFORMIN (GLUCOPHAGE) 500 MG tablet Take 500 mg by mouth daily with breakfast.    . omeprazole (PRILOSEC) 20 MG capsule Take 20 mg by mouth daily.    Marland Kitchen REVLIMID 10 MG capsule TAKE 1  CAPSULE (10MG) BY MOUTH EVERY OTHER DAY 14 capsule 0  . traMADol (ULTRAM) 50 MG tablet TAKE 1 TABLET THREE TIMES A DAY AS NEEDED 60 tablet 0  . vitamin B-12 (CYANOCOBALAMIN) 1000 MCG tablet Take 1,000 mcg by mouth. 2-3 times a week    . zolendronic acid 4 mg in sodium chloride 0.9 % 100 mL Inject 4 mg into the vein every 30 (thirty) days.    Marland Kitchen HYDROcodone-acetaminophen (NORCO) 5-325 MG tablet Take 1-2 tablets by mouth every 4 (four) hours as needed for moderate pain. (Patient not taking: Reported on 02/12/2016) 20 tablet 0   No current facility-administered medications for this visit.    Review of Systems:  GENERAL:  Feels good. No fevers or sweats.  Weight stable (tryong to lose weight). PERFORMANCE STATUS (ECOG): 1 HEENT: No visual changes, runny nose, sore throat, mouth sores or tenderness. Lungs: No shortness of breath or cough. No hemoptysis. Cardiac: No chest pain, palpitations, orthopnea, or PND.  Pre-op echo and stress test good. GI: Diarrhea alternating with constipation. No nausea, vomiting, constipation, melena or hematochezia. GU: No urgency, frequency, dysuria, or hematuria. Musculoskeletal: Spinal stenosis causing aching in legs. No joint pain. No muscle tenderness. Extremities: No pain or swelling. Skin: No rashes or skin changes. Neuro: No headache, numbness or weakness, balance or coordination issues. Endocrine: Diabetes.  No tthyroid issues, hot flashes or night sweats. Psych: No mood changes, depression or anxiety. Pain: No focal pain. Review of systems: All other systems reviewed and found to be negative.  Physical Exam: Blood pressure 120/74, pulse 61, temperature 96.6 F (35.9 C), temperature source Tympanic, resp. rate 18, weight 206 lb 2.1 oz (93.5 kg), SpO2 96 %.  GENERAL: Well developed, well nourished, gentleman sitting comfortably in the exam room in no acute distress. MENTAL STATUS: Alert and oriented to person, place and time. HEAD:  Short gray hair. Male pattern baldness. Normocephalic, atraumatic, face symmetric, no Cushingoid features. EYES: Blue eyes. Pupils equal round and reactive to light and accomodation. No conjunctivitis or scleral icterus. ENT: Oropharynx clear without lesion. Tongue normal. Mucous membranes moist.  RESPIRATORY: Clear to auscultation without rales, wheezes or rhonchi. CARDIOVASCULAR: Regular rate and rhythm without murmur, rub or gallop. ABDOMEN: Soft, non-tender, with active bowel sounds, and no hepatosplenomegaly. No masses. SKIN: No rashes, ulcers or lesions. EXTREMITIES: No edema, no skin discoloration or tenderness. No palpable cords. LYMPH NODES: No palpable cervical, supraclavicular, axillary or inguinal adenopathy  NEUROLOGICAL: Unremarkable. PSYCH: Appropriate  Appointment  on 02/12/2016  Component Date Value Ref Range Status  . WBC 02/12/2016 3.0* 3.8 - 10.6 K/uL Final  . RBC 02/12/2016 3.65* 4.40 - 5.90 MIL/uL Final  . Hemoglobin 02/12/2016 13.2  13.0 - 18.0 g/dL Final  . HCT 02/12/2016 37.0* 40.0 - 52.0 % Final  . MCV 02/12/2016 101.6* 80.0 - 100.0 fL Final  . MCH 02/12/2016 36.2* 26.0 - 34.0 pg Final  . MCHC 02/12/2016 35.6  32.0 - 36.0 g/dL Final  . RDW 02/12/2016 13.7  11.5 - 14.5 % Final  . Platelets 02/12/2016 158  150 - 440 K/uL Final  . Neutrophils Relative % 02/12/2016 33   Final  . Neutro Abs 02/12/2016 1.0* 1.4 - 6.5 K/uL Final  . Lymphocytes Relative 02/12/2016 38   Final  . Lymphs Abs 02/12/2016 1.1  1.0 - 3.6 K/uL Final  . Monocytes Relative 02/12/2016 16   Final  . Monocytes Absolute 02/12/2016 0.5  0.2 - 1.0 K/uL Final  . Eosinophils Relative 02/12/2016 9   Final  . Eosinophils Absolute 02/12/2016 0.3  0 - 0.7 K/uL Final  . Basophils Relative 02/12/2016 4   Final  . Basophils Absolute 02/12/2016 0.1  0 - 0.1 K/uL Final  . Sodium 02/12/2016 135  135 - 145 mmol/L Final  . Potassium 02/12/2016 3.8  3.5 - 5.1 mmol/L Final  . Chloride 02/12/2016  105  101 - 111 mmol/L Final  . CO2 02/12/2016 25  22 - 32 mmol/L Final  . Glucose, Bld 02/12/2016 134* 65 - 99 mg/dL Final  . BUN 02/12/2016 14  6 - 20 mg/dL Final  . Creatinine, Ser 02/12/2016 0.86  0.61 - 1.24 mg/dL Final  . Calcium 02/12/2016 8.5* 8.9 - 10.3 mg/dL Final  . Total Protein 02/12/2016 7.8  6.5 - 8.1 g/dL Final  . Albumin 02/12/2016 3.8  3.5 - 5.0 g/dL Final  . AST 02/12/2016 34  15 - 41 U/L Final  . ALT 02/12/2016 28  17 - 63 U/L Final  . Alkaline Phosphatase 02/12/2016 53  38 - 126 U/L Final  . Total Bilirubin 02/12/2016 0.6  0.3 - 1.2 mg/dL Final  . GFR calc non Af Amer 02/12/2016 >60  >60 mL/min Final  . GFR calc Af Amer 02/12/2016 >60  >60 mL/min Final   Comment: (NOTE) The eGFR has been calculated using the CKD EPI equation. This calculation has not been validated in all clinical situations. eGFR's persistently <60 mL/min signify possible Chronic Kidney Disease.   Georgiann Hahn gap 02/12/2016 5  5 - 15 Final    Assessment:  Holt Woolbright. is a 70 y.o. male with stage III multiple myeloma. He presented in 02/2013 with left-sided sharp pain. Evaluation revealed wide-spread lytic lesions including fracture of the left 5th rib laterally. CT scans on 02/22/2013 showed innumerable lytic lesions in thoracic spine, sternum, clavicle, scapula, and ribs.   Bone marrow biopsy on 03/13/2013 revealed 15 to 20% plasma cells. Iron stores were absent. Renal function was normal. SPEP on 03/01/2013 revealed a 1.4 gm/dL IgG monoclonal lambda. IgG was 1987.   He began induction with Revlimid 25 mg a day (3 weeks on and 1 week off) and Decadron (40 mg once a week) on 04/09/2013. SPEP has revealed no monoclonal protein since 07/26/2014 (last check 12/01/2015). IgG was 1362 on 07/26/2014 and 1097 on 12/19/2014. Light chains have been monitored: lambda free light chains 33.20 (ratio of 1.56) on 09/20/2014, 31.51 (ratio of 1.43) on 12/19/2014, 30.25 (ratio of 1.56) on 05/01/2015,  and  47.75 (ratio 1.49) on 01/01/2016.  24 hour UPEP on 12/02/2015 revealed no monoclonal protein.  With remission, Revlimid was decreased to 10 mg a day then to 10 mg every other day secondary to issues with renal function and diarrhea.   Bone survey on 05/30/2015 revealed the majority of small lytic lesions were stable.  There was possibly new lesion in the distal left clavicle and right mid femur (small) and a possible developing lucency in the proximal left femoral shaft.  The calvarial lesion noted on the prior study was not well seen (? positional).  Bone survey on 11/27/2015 revealed widespread bony lytic lesions consistent with multiple myeloma.  The vast majority of lesions were stable.  A few small lesions were not previously seen.  One lesion was previously obscured by bowel contents.  Two small lesions in the right distal femoral diaphysis appeared new.  He receives Zometa every 3 months (last 10/06/2015). He takes calcium 600 mg BID. He receives B12 monthly (last 01/29/2016). B12 was 370 on 01/17/2015.  Abdominal and pelvic CT scan on 07/15/2015 revealed  extensive sigmoid diverticulosis. There was questionable wall thickening of the ascending colon versus artifact from incomplete distention.  Colonoscopy on 11/11/2015 revealed diverticulosis in the sigmoid colon and distal descending colon and ascending colon.  There was a 2 mm polyp in the mid sigmoid colon.  Pathology revealed a hyperplastic polyp, negative for dysplasia or malignancy.  Symptomatically, he has issues with spinal stenosis.  Exam is stable.  He has mild hypocalcemia.  Plan: 1.  Labs today:  CBC with diff, CMP, SPEP. 2.  Continue Revlimid 10 mg QOD. 3.  B12 monthly  (next due 02/26/2016). 4.  Postpone Zometa secondary to low calcium. 5.  Increase oral calcium. 6.  Schedule bone survey 05/26/2016. 7.  RTC on 03/25/2016 for MD assessment, labs (CBC with diff, CMP, SPEP, FLCA), B12 and Zometa.   Lequita Asal,  MD  02/12/2016, 9:03 AM

## 2016-02-13 ENCOUNTER — Other Ambulatory Visit: Payer: Self-pay

## 2016-02-13 DIAGNOSIS — C9001 Multiple myeloma in remission: Secondary | ICD-10-CM

## 2016-02-13 LAB — PROTEIN ELECTROPHORESIS, SERUM
A/G Ratio: 1 (ref 0.7–1.7)
Albumin ELP: 3.4 g/dL (ref 2.9–4.4)
Alpha-1-Globulin: 0.2 g/dL (ref 0.0–0.4)
Alpha-2-Globulin: 0.8 g/dL (ref 0.4–1.0)
Beta Globulin: 1.3 g/dL (ref 0.7–1.3)
Gamma Globulin: 1.3 g/dL (ref 0.4–1.8)
Globulin, Total: 3.5 g/dL (ref 2.2–3.9)
Total Protein ELP: 6.9 g/dL (ref 6.0–8.5)

## 2016-02-17 DIAGNOSIS — E119 Type 2 diabetes mellitus without complications: Secondary | ICD-10-CM | POA: Insufficient documentation

## 2016-02-19 ENCOUNTER — Other Ambulatory Visit: Payer: Self-pay | Admitting: Hematology and Oncology

## 2016-02-19 ENCOUNTER — Inpatient Hospital Stay: Payer: BLUE CROSS/BLUE SHIELD

## 2016-02-19 DIAGNOSIS — C9001 Multiple myeloma in remission: Secondary | ICD-10-CM

## 2016-02-19 DIAGNOSIS — C9000 Multiple myeloma not having achieved remission: Secondary | ICD-10-CM | POA: Diagnosis not present

## 2016-02-19 LAB — CBC WITH DIFFERENTIAL/PLATELET
Basophils Absolute: 0.1 10*3/uL (ref 0–0.1)
Basophils Relative: 4 %
Eosinophils Absolute: 0.1 10*3/uL (ref 0–0.7)
Eosinophils Relative: 5 %
HCT: 36.9 % — ABNORMAL LOW (ref 40.0–52.0)
Hemoglobin: 13.2 g/dL (ref 13.0–18.0)
Lymphocytes Relative: 34 %
Lymphs Abs: 1 10*3/uL (ref 1.0–3.6)
MCH: 36.6 pg — ABNORMAL HIGH (ref 26.0–34.0)
MCHC: 35.8 g/dL (ref 32.0–36.0)
MCV: 102.1 fL — ABNORMAL HIGH (ref 80.0–100.0)
Monocytes Absolute: 0.5 10*3/uL (ref 0.2–1.0)
Monocytes Relative: 15 %
Neutro Abs: 1.3 10*3/uL — ABNORMAL LOW (ref 1.4–6.5)
Neutrophils Relative %: 42 %
Platelets: 161 10*3/uL (ref 150–440)
RBC: 3.61 MIL/uL — ABNORMAL LOW (ref 4.40–5.90)
RDW: 13.6 % (ref 11.5–14.5)
WBC: 3 10*3/uL — ABNORMAL LOW (ref 3.8–10.6)

## 2016-02-23 ENCOUNTER — Telehealth: Payer: Self-pay | Admitting: *Deleted

## 2016-02-23 DIAGNOSIS — C9001 Multiple myeloma in remission: Secondary | ICD-10-CM

## 2016-02-23 NOTE — Telephone Encounter (Signed)
Asking when he is to restart his revlimid. Had lab done last week   Ref Range 4d ago    WBC 3.8 - 10.6 K/uL 3.0 (L)   RBC 4.40 - 5.90 MIL/uL 3.61 (L)   Hemoglobin 13.0 - 18.0 g/dL 13.2   HCT 40.0 - 52.0 % 36.9 (L)   MCV 80.0 - 100.0 fL 102.1 (H)   MCH 26.0 - 34.0 pg 36.6 (H)   MCHC 32.0 - 36.0 g/dL 35.8   RDW 11.5 - 14.5 % 13.6   Platelets 150 - 440 K/uL 161   Neutrophils Relative % % 42   Neutro Abs 1.4 - 6.5 K/uL 1.3 (L)   Lymphocytes Relative % 34   Lymphs Abs 1.0 - 3.6 K/uL 1.0   Monocytes Relative % 15   Monocytes Absolute 0.2 - 1.0 K/uL 0.5   Eosinophils Relative % 5   Eosinophils Absolute 0 - 0.7 K/uL 0.1   Basophils Relative % 4   Basophils Absolute 0 - 0.1 K/uL 0.1

## 2016-02-23 NOTE — Telephone Encounter (Signed)
Per Dr Mike Gip, recheck labs 1 week, Pt has appt 4/20 which is 1 week post last lab so pt agrees to come in at 1015 for lab

## 2016-02-26 ENCOUNTER — Inpatient Hospital Stay: Payer: BLUE CROSS/BLUE SHIELD

## 2016-02-26 ENCOUNTER — Ambulatory Visit: Payer: BLUE CROSS/BLUE SHIELD | Admitting: Hematology and Oncology

## 2016-02-26 ENCOUNTER — Ambulatory Visit: Payer: BLUE CROSS/BLUE SHIELD

## 2016-02-26 ENCOUNTER — Other Ambulatory Visit: Payer: BLUE CROSS/BLUE SHIELD

## 2016-02-26 DIAGNOSIS — E538 Deficiency of other specified B group vitamins: Secondary | ICD-10-CM

## 2016-02-26 DIAGNOSIS — C9001 Multiple myeloma in remission: Secondary | ICD-10-CM

## 2016-02-26 DIAGNOSIS — C9000 Multiple myeloma not having achieved remission: Secondary | ICD-10-CM | POA: Diagnosis not present

## 2016-02-26 LAB — CBC WITH DIFFERENTIAL/PLATELET
Basophils Absolute: 0.1 10*3/uL (ref 0–0.1)
Basophils Relative: 3 %
Eosinophils Absolute: 0.2 10*3/uL (ref 0–0.7)
Eosinophils Relative: 5 %
HCT: 37.6 % — ABNORMAL LOW (ref 40.0–52.0)
Hemoglobin: 13.3 g/dL (ref 13.0–18.0)
Lymphocytes Relative: 34 %
Lymphs Abs: 1.2 10*3/uL (ref 1.0–3.6)
MCH: 36.1 pg — ABNORMAL HIGH (ref 26.0–34.0)
MCHC: 35.2 g/dL (ref 32.0–36.0)
MCV: 102.6 fL — ABNORMAL HIGH (ref 80.0–100.0)
Monocytes Absolute: 0.4 10*3/uL (ref 0.2–1.0)
Monocytes Relative: 12 %
Neutro Abs: 1.6 10*3/uL (ref 1.4–6.5)
Neutrophils Relative %: 46 %
Platelets: 163 10*3/uL (ref 150–440)
RBC: 3.67 MIL/uL — ABNORMAL LOW (ref 4.40–5.90)
RDW: 13.4 % (ref 11.5–14.5)
WBC: 3.4 10*3/uL — ABNORMAL LOW (ref 3.8–10.6)

## 2016-02-26 MED ORDER — CYANOCOBALAMIN 1000 MCG/ML IJ SOLN
1000.0000 ug | Freq: Once | INTRAMUSCULAR | Status: AC
Start: 2016-02-26 — End: 2016-02-26
  Administered 2016-02-26: 1000 ug via INTRAMUSCULAR
  Filled 2016-02-26: qty 1

## 2016-02-27 ENCOUNTER — Other Ambulatory Visit: Payer: Self-pay

## 2016-03-01 ENCOUNTER — Telehealth: Payer: Self-pay | Admitting: *Deleted

## 2016-03-01 NOTE — Telephone Encounter (Signed)
Asking if he is to restart his Revlimid based on labs from Thursday. Per Dr Mike Gip, he is to wait 1 more week then restart no need to recheck labs before starting. P informed of this and he will restart his Revlimid on Monday 03/08/16. He repeated this to me

## 2016-03-04 ENCOUNTER — Ambulatory Visit: Payer: BLUE CROSS/BLUE SHIELD | Admitting: Hematology and Oncology

## 2016-03-04 ENCOUNTER — Ambulatory Visit: Payer: BLUE CROSS/BLUE SHIELD

## 2016-03-04 ENCOUNTER — Other Ambulatory Visit: Payer: BLUE CROSS/BLUE SHIELD

## 2016-03-23 ENCOUNTER — Other Ambulatory Visit: Payer: Self-pay | Admitting: Hematology and Oncology

## 2016-03-25 ENCOUNTER — Other Ambulatory Visit: Payer: Self-pay | Admitting: Hematology and Oncology

## 2016-03-25 ENCOUNTER — Inpatient Hospital Stay (HOSPITAL_BASED_OUTPATIENT_CLINIC_OR_DEPARTMENT_OTHER): Payer: BLUE CROSS/BLUE SHIELD | Admitting: Hematology and Oncology

## 2016-03-25 ENCOUNTER — Inpatient Hospital Stay: Payer: BLUE CROSS/BLUE SHIELD

## 2016-03-25 ENCOUNTER — Inpatient Hospital Stay: Payer: BLUE CROSS/BLUE SHIELD | Attending: Hematology and Oncology

## 2016-03-25 VITALS — BP 133/76 | HR 75 | Temp 96.1°F | Resp 18 | Ht 67.0 in | Wt 203.4 lb

## 2016-03-25 DIAGNOSIS — E785 Hyperlipidemia, unspecified: Secondary | ICD-10-CM | POA: Insufficient documentation

## 2016-03-25 DIAGNOSIS — Z79899 Other long term (current) drug therapy: Secondary | ICD-10-CM | POA: Insufficient documentation

## 2016-03-25 DIAGNOSIS — E119 Type 2 diabetes mellitus without complications: Secondary | ICD-10-CM | POA: Diagnosis not present

## 2016-03-25 DIAGNOSIS — I208 Other forms of angina pectoris: Secondary | ICD-10-CM

## 2016-03-25 DIAGNOSIS — I252 Old myocardial infarction: Secondary | ICD-10-CM | POA: Diagnosis not present

## 2016-03-25 DIAGNOSIS — I10 Essential (primary) hypertension: Secondary | ICD-10-CM | POA: Diagnosis not present

## 2016-03-25 DIAGNOSIS — R197 Diarrhea, unspecified: Secondary | ICD-10-CM

## 2016-03-25 DIAGNOSIS — Z87891 Personal history of nicotine dependence: Secondary | ICD-10-CM

## 2016-03-25 DIAGNOSIS — Z8042 Family history of malignant neoplasm of prostate: Secondary | ICD-10-CM | POA: Diagnosis not present

## 2016-03-25 DIAGNOSIS — M4800 Spinal stenosis, site unspecified: Secondary | ICD-10-CM | POA: Diagnosis not present

## 2016-03-25 DIAGNOSIS — Z7982 Long term (current) use of aspirin: Secondary | ICD-10-CM | POA: Diagnosis not present

## 2016-03-25 DIAGNOSIS — C9001 Multiple myeloma in remission: Secondary | ICD-10-CM | POA: Diagnosis not present

## 2016-03-25 DIAGNOSIS — Z7984 Long term (current) use of oral hypoglycemic drugs: Secondary | ICD-10-CM

## 2016-03-25 DIAGNOSIS — I2510 Atherosclerotic heart disease of native coronary artery without angina pectoris: Secondary | ICD-10-CM

## 2016-03-25 DIAGNOSIS — K219 Gastro-esophageal reflux disease without esophagitis: Secondary | ICD-10-CM | POA: Diagnosis not present

## 2016-03-25 DIAGNOSIS — K5790 Diverticulosis of intestine, part unspecified, without perforation or abscess without bleeding: Secondary | ICD-10-CM

## 2016-03-25 DIAGNOSIS — I509 Heart failure, unspecified: Secondary | ICD-10-CM | POA: Diagnosis not present

## 2016-03-25 DIAGNOSIS — G4730 Sleep apnea, unspecified: Secondary | ICD-10-CM | POA: Diagnosis not present

## 2016-03-25 DIAGNOSIS — E538 Deficiency of other specified B group vitamins: Secondary | ICD-10-CM

## 2016-03-25 DIAGNOSIS — F418 Other specified anxiety disorders: Secondary | ICD-10-CM | POA: Diagnosis not present

## 2016-03-25 DIAGNOSIS — K5900 Constipation, unspecified: Secondary | ICD-10-CM | POA: Diagnosis not present

## 2016-03-25 LAB — COMPREHENSIVE METABOLIC PANEL
ALT: 36 U/L (ref 17–63)
AST: 37 U/L (ref 15–41)
Albumin: 4.1 g/dL (ref 3.5–5.0)
Alkaline Phosphatase: 64 U/L (ref 38–126)
Anion gap: 8 (ref 5–15)
BUN: 19 mg/dL (ref 6–20)
CO2: 22 mmol/L (ref 22–32)
Calcium: 9.2 mg/dL (ref 8.9–10.3)
Chloride: 105 mmol/L (ref 101–111)
Creatinine, Ser: 0.84 mg/dL (ref 0.61–1.24)
GFR calc Af Amer: >60 mL/min (ref >60)
GFR calc non Af Amer: >60 mL/min (ref >60)
Glucose, Bld: 160 mg/dL — ABNORMAL HIGH (ref 65–99)
Potassium: 4.1 mmol/L (ref 3.5–5.1)
Sodium: 135 mmol/L (ref 135–145)
Total Bilirubin: 0.8 mg/dL (ref 0.3–1.2)
Total Protein: 8.1 g/dL (ref 6.5–8.1)

## 2016-03-25 LAB — CBC WITH DIFFERENTIAL/PLATELET
Basophils Absolute: 0 10*3/uL (ref 0–0.1)
Basophils Relative: 1 %
Eosinophils Absolute: 0.3 10*3/uL (ref 0–0.7)
Eosinophils Relative: 7 %
HCT: 36.3 % — ABNORMAL LOW (ref 40.0–52.0)
Hemoglobin: 12.8 g/dL — ABNORMAL LOW (ref 13.0–18.0)
Lymphocytes Relative: 29 %
Lymphs Abs: 1.1 10*3/uL (ref 1.0–3.6)
MCH: 36.4 pg — ABNORMAL HIGH (ref 26.0–34.0)
MCHC: 35.2 g/dL (ref 32.0–36.0)
MCV: 103.5 fL — ABNORMAL HIGH (ref 80.0–100.0)
Monocytes Absolute: 0.7 10*3/uL (ref 0.2–1.0)
Monocytes Relative: 18 %
Neutro Abs: 1.8 10*3/uL (ref 1.4–6.5)
Neutrophils Relative %: 45 %
Platelets: 185 10*3/uL (ref 150–440)
RBC: 3.51 MIL/uL — ABNORMAL LOW (ref 4.40–5.90)
RDW: 14.3 % (ref 11.5–14.5)
WBC: 3.9 10*3/uL (ref 3.8–10.6)

## 2016-03-25 MED ORDER — ZOLEDRONIC ACID 4 MG/100ML IV SOLN
4.0000 mg | Freq: Once | INTRAVENOUS | Status: AC
  Administered 2016-03-25: 4 mg via INTRAVENOUS
  Filled 2016-03-25: qty 100

## 2016-03-25 MED ORDER — CYANOCOBALAMIN 1000 MCG/ML IJ SOLN
1000.0000 ug | Freq: Once | INTRAMUSCULAR | Status: AC
  Administered 2016-03-25: 1000 ug via INTRAMUSCULAR
  Filled 2016-03-25: qty 1

## 2016-03-25 MED ORDER — SODIUM CHLORIDE 0.9 % IV SOLN
Freq: Once | INTRAVENOUS | Status: AC
  Administered 2016-03-25: 15:00:00 via INTRAVENOUS
  Filled 2016-03-25: qty 1000

## 2016-03-25 NOTE — Progress Notes (Signed)
Pt verbalizes he has fatigue still.  Pt reports a new lower abdominal pain about at belt line.  Pt reports that he thinks it could be related to lifting bags of soil and it started about a week ago.

## 2016-03-25 NOTE — Progress Notes (Signed)
[facility] day:  03/25/2016   Chief Complaint: Luis [name]. is a 70 y.o. male with multiple myeloma on Revlimid who is seen for 1 month assessment on Revlimid prior to monthly B12.  HPI: The patient was last seen in the medical oncology clinic on 02/12/2016.  At that time, , he had issues with spinal stenosis.  His legs ached at night.  Appetite was good.  He was trying to lose weight.  He was going to the gym 3 times a week.  He denied any issues with infections.    Labs at last visit included a hematocrit of 37.0, hemoglobin 13.2, MCV 191.6, platelets 158,000, WBC 3000 with an ANC of 1000.  SPEP revealed no monoclonal protein.  Creatinine was 0.86.  Calcium was 8.5.  Zometa was held secondary to his mild hypocalcemia. His calcium was increased from 2 pills to 3 pills a day.  He received B12 on 02/26/2016.  During the interim, his Revlimid was held briefly secondary to [place] of 1000.  He has been back on his Revlimid for 2 weeks.  He notes issues with constipation and diarrhea.  He describes some aches and pains secondary to lifting bags of soil.   Past Medical History  Diagnosis Date  . Hypertension   . Spinal stenosis   . GERD (gastroesophageal reflux disease)   . CHF (congestive heart failure) ([place])   . Arthritis   . Angina pectoris ([place])   . Hyperlipidemia   . Multiple myeloma ([place])   . Multiple myeloma ([place]) 03/27/2015  . Diabetes mellitus without complication [facility])     Patient takes Metformin.  . Sleep apnea     No CPAP @ present  . Shortness of breath dyspnea   . Anxiety   . Depression   . Coronary artery disease   . Diverticulosis   . Myocardial infarction [facility]) April 2001    widowmaker    Past Surgical History  Procedure Laterality Date  . Rotator cuff repair    . Coronary artery bypass graft    . Pilonidal cyst excision    . Total hip arthroplasty Right   . Cataract extraction    . Inguinal hernia repair    .  Carpal tunnel release    . Cardiac catheterization    . Joint replacement Right 2008    Right Total Hip Replacement  . Eye surgery Bilateral     Cataract Extraction  . Ventral hernia repair N/A 08/15/2015    Procedure: VENTRAL HERNIA REPAIR WITH MESH ;  Surgeon: Leonie Green, MD;  Location: [facility] ORS;  Service: General;  Laterality: N/A;  . Colonoscopy with propofol N/A 11/11/2015    Procedure: COLONOSCOPY WITH PROPOFOL;  Surgeon: Lollie Sails, MD;  Location: [facility] ENDOSCOPY;  Service: Endoscopy;  Laterality: N/A;    Family History  Problem Relation Age of Onset  . Heart disease Father   . Stroke Mother   . Prostate cancer Maternal Grandfather 79    Social History:  reports that he quit smoking about 16 years ago. His smoking use included Cigarettes. He has a 60 pack-year smoking history. He quit smokeless tobacco use about 16 years ago. He reports that he does not drink alcohol or use illicit drugs.  He notes that his wife goes off to work.  He has projects that he likes to do at home.  He does wood working.  His 42nd high school graduation anniversary is coming  up in 06/2016.  The patient is alone today.  Allergies:  Allergies  Allergen Reactions  . Pravachol [Pravastatin]   . Pravastatin Sodium Other (See Comments)  . Statins Other (See Comments)    Muscle aches  . Zocor [Simvastatin] Other (See Comments)    Muscle aches    Current Medications: Current Outpatient Prescriptions  Medication Sig Dispense Refill  . ALPRAZolam (XANAX) 0.25 MG tablet Take 0.25 mg by mouth at bedtime as needed. for sleep  3  . aspirin 81 MG tablet Take 81 mg by mouth daily.     [name] Pollen 500 MG CHEW Chew 1 tablet by mouth daily.    . Calcium Carb-Cholecalciferol 600-200 MG-UNIT TABS Take 1 tablet by mouth daily.    . ergocalciferol (VITAMIN D2) 50000 UNITS capsule Take 50,000 Units by mouth once a week.    Marland Kitchen KLOR-CON M20 20 MEQ tablet TAKE 1 TABLET (20 MEQ TOTAL) BY MOUTH DAILY. 30  tablet 2  . lisinopril (PRINIVIL,ZESTRIL) 20 MG tablet Take 1 tablet by mouth daily.    Marland Kitchen lovastatin (MEVACOR) 40 MG tablet Take 40 mg by mouth at bedtime.    . metFORMIN (GLUCOPHAGE) 500 MG tablet Take 500 mg by mouth daily with breakfast.    . omeprazole (PRILOSEC) 20 MG capsule Take 20 mg by mouth daily.    Marland Kitchen REVLIMID 10 MG capsule TAKE 1 CAPSULE (10MG) BY MOUTH EVERY OTHER DAY 14 capsule 0  . traMADol (ULTRAM) 50 MG tablet TAKE 1 TABLET THREE TIMES A DAY AS NEEDED 60 tablet 0  . triamcinolone cream (KENALOG) 0.1 %     . vitamin B-12 (CYANOCOBALAMIN) 1000 MCG tablet Take 1,000 mcg by mouth. 2-3 times a week    . HYDROcodone-acetaminophen (NORCO) 5-325 MG tablet Take 1-2 tablets by mouth every 4 (four) hours as needed for moderate pain. (Patient not taking: Reported on 03/25/2016) 20 tablet 0   No current facility-administered medications for this visit.    Review of Systems:  GENERAL:  Feels good.  Some fatigue.  No fevers or sweats.  Weight down 3 pounds (tryong to lose weight). PERFORMANCE STATUS (ECOG): 1 HEENT: No visual changes, runny nose, sore throat, mouth sores or tenderness. Lungs: No shortness of breath or cough. No hemoptysis. Cardiac: No chest pain, palpitations, orthopnea, or PND.  Pre-op echo and stress test good. GI: Diarrhea alternating with constipation. No nausea, vomiting, constipation, melena or hematochezia. GU: No urgency, frequency, dysuria, or hematuria. Musculoskeletal: Spinal stenosis causing legs aching. No joint pain. No muscle tenderness. Extremities: No pain or swelling. Skin: No rashes or skin changes. Neuro: No headache, numbness or weakness, balance or coordination issues. Endocrine: Diabetes.  No tthyroid issues, hot flashes or night sweats. Psych: No mood changes, depression or anxiety. Pain: No focal pain. Review of systems: All other systems reviewed and found to be negative.  Physical Exam: Blood pressure 133/76, pulse 75,  temperature 96.1 F (35.6 C), temperature source Tympanic, resp. rate 18, height '5\' 7"'  (1.702 m), weight 203 lb 6 oz (92.25 kg).  GENERAL: Well developed, well nourished, gentleman sitting comfortably in the exam room in no acute distress. MENTAL STATUS: Alert and oriented to person, place and time. HEAD: Short gray hair. Male pattern baldness. Normocephalic, atraumatic, face symmetric, no Cushingoid features. EYES: Blue eyes. Pupils equal round and reactive to light and accomodation. No conjunctivitis or scleral icterus. ENT: Oropharynx clear without lesion. Tongue normal. Mucous membranes moist.  RESPIRATORY: Clear to auscultation without rales, wheezes or rhonchi. CARDIOVASCULAR:  Regular rate and rhythm without murmur, rub or gallop. ABDOMEN: Soft, non-tender, with active bowel sounds, and no hepatosplenomegaly. No masses. SKIN: No rashes, ulcers or lesions. EXTREMITIES: No edema, no skin discoloration or tenderness. No palpable cords. LYMPH NODES: No palpable cervical, supraclavicular, axillary or inguinal adenopathy  NEUROLOGICAL: Unremarkable. PSYCH: Appropriate  Appointment on 03/25/2016  Component Date Value Ref Range Status  . WBC 03/25/2016 3.9  3.8 - 10.6 K/uL Final  . RBC 03/25/2016 3.51* 4.40 - 5.90 MIL/uL Final  . Hemoglobin 03/25/2016 12.8* 13.0 - 18.0 g/dL Final  . HCT 03/25/2016 36.3* 40.0 - 52.0 % Final  . MCV 03/25/2016 103.5* 80.0 - 100.0 fL Final  . MCH 03/25/2016 36.4* 26.0 - 34.0 pg Final  . MCHC 03/25/2016 35.2  32.0 - 36.0 g/dL Final  . RDW 03/25/2016 14.3  11.5 - 14.5 % Final  . Platelets 03/25/2016 185  150 - 440 K/uL Final  . Neutrophils Relative % 03/25/2016 45   Final  . Neutro Abs 03/25/2016 1.8  1.4 - 6.5 K/uL Final  . Lymphocytes Relative 03/25/2016 29   Final  . Lymphs Abs 03/25/2016 1.1  1.0 - 3.6 K/uL Final  . Monocytes Relative 03/25/2016 18   Final  . Monocytes Absolute 03/25/2016 0.7  0.2 - 1.0 K/uL Final  . Eosinophils  Relative 03/25/2016 7   Final  . Eosinophils Absolute 03/25/2016 0.3  0 - 0.7 K/uL Final  . Basophils Relative 03/25/2016 1   Final  . Basophils Absolute 03/25/2016 0.0  0 - 0.1 K/uL Final  . Sodium 03/25/2016 135  135 - 145 mmol/L Final  . Potassium 03/25/2016 4.1  3.5 - 5.1 mmol/L Final  . Chloride 03/25/2016 105  101 - 111 mmol/L Final  . CO2 03/25/2016 22  22 - 32 mmol/L Final  . Glucose, Bld 03/25/2016 160* 65 - 99 mg/dL Final  . BUN 03/25/2016 19  6 - 20 mg/dL Final  . Creatinine, Ser 03/25/2016 0.84  0.61 - 1.24 mg/dL Final  . Calcium 03/25/2016 9.2  8.9 - 10.3 mg/dL Final  . Total Protein 03/25/2016 8.1  6.5 - 8.1 g/dL Final  . Albumin 03/25/2016 4.1  3.5 - 5.0 g/dL Final  . AST 03/25/2016 37  15 - 41 U/L Final  . ALT 03/25/2016 36  17 - 63 U/L Final  . Alkaline Phosphatase 03/25/2016 64  38 - 126 U/L Final  . Total Bilirubin 03/25/2016 0.8  0.3 - 1.2 mg/dL Final  . GFR calc non Af Amer 03/25/2016 >60  >60 mL/min Final  . GFR calc Af Amer 03/25/2016 >60  >60 mL/min Final   Comment: (NOTE) The eGFR has been calculated using the CKD EPI equation. This calculation has not been validated in all clinical situations. eGFR's persistently <60 mL/min signify possible Chronic Kidney Disease.   Georgiann Hahn gap 03/25/2016 8  5 - 15 Final    Assessment:  Chelsey Kimberley. is a 70 y.o. male with stage III multiple myeloma. He presented in 02/2013 with left-sided sharp pain. Evaluation revealed wide-spread lytic lesions including fracture of the left 5th rib laterally. CT scans on 02/22/2013 showed innumerable lytic lesions in thoracic spine, sternum, clavicle, scapula, and ribs.   Bone marrow biopsy on 03/13/2013 revealed 15 to 20% plasma cells. Iron stores were absent. Renal function was normal. SPEP on 03/01/2013 revealed a 1.4 gm/dL IgG monoclonal lambda. IgG was 1987.   He began induction with Revlimid 25 mg a day (3 weeks on and 1 week  off) and Decadron (40 mg once a week) on  04/09/2013. SPEP has revealed no monoclonal protein since 07/26/2014 (last check 03/25/2016). IgG was 1362 on 07/26/2014 and 1097 on 12/19/2014. Light chains have been monitored: lambda free light chains 33.20 (ratio of 1.56) on 09/20/2014, 31.51 (ratio of 1.43) on 12/19/2014, 30.25 (ratio of 1.56) on 05/01/2015, 31.95 (ratio 1.49) on 01/01/2016, and 28.02 (ratio 1.29) on 03/25/2016.  24 hour UPEP on 12/02/2015 revealed no monoclonal protein.  With remission, Revlimid was decreased to 10 mg a day then to 10 mg every other day secondary to issues with renal function and diarrhea.   Bone survey on 05/30/2015 revealed the majority of small lytic lesions were stable.  There was possibly new lesion in the distal left clavicle and right mid femur (small) and a possible developing lucency in the proximal left femoral shaft.  The calvarial lesion noted on the prior study was not well seen (? positional).  Bone survey on 11/27/2015 revealed widespread bony lytic lesions consistent with multiple myeloma.  The vast majority of lesions were stable.  A few small lesions were not previously seen.  One lesion was previously obscured by bowel contents.  Two small lesions in the right distal femoral diaphysis appeared new.  He receives Zometa every 3 months (last 10/06/2015). He takes calcium 600 mg BID. He receives B12 monthly (last 02/26/2016). B12 was 370 on 01/17/2015.  Abdominal and pelvic CT scan on 07/15/2015 revealed  extensive sigmoid diverticulosis. There was questionable wall thickening of the ascending colon versus artifact from incomplete distention.  Colonoscopy on 11/11/2015 revealed diverticulosis in the sigmoid colon and distal descending colon and ascending colon.  There was a 2 mm polyp in the mid sigmoid colon.  Pathology revealed a hyperplastic polyp, negative for dysplasia or malignancy.  Symptomatically, he is doing well.  Exam is stable.  Hypocalcemia has resolved with increased calcium  supplementation.  Plan: 1.  Labs today:  CBC with diff, CMP, SPEP, FLCA. 2.  Continue Revlimid 10 mg QOD. 3.  B12 today and monthly. 4.  Zometa today. 5.  Continue increased calcium supplimentation. 6.  Bone survey 05/26/2016. 7.  RTC in 1 month for labs (CBC with diff, CMP) and B12. 8.  RTC in 2 months for MD assessment, labs (CBC with diff, CMP, SPEP, FLCA), and B12.   Lequita Asal, MD  03/25/2016, 2:34 PM

## 2016-03-26 LAB — PROTEIN ELECTROPHORESIS, SERUM
A/G Ratio: 1 (ref 0.7–1.7)
Albumin ELP: 3.6 g/dL (ref 2.9–4.4)
Alpha-1-Globulin: 0.2 g/dL (ref 0.0–0.4)
Alpha-2-Globulin: 0.9 g/dL (ref 0.4–1.0)
Beta Globulin: 1.3 g/dL (ref 0.7–1.3)
Gamma Globulin: 1.1 g/dL (ref 0.4–1.8)
Globulin, Total: 3.5 g/dL (ref 2.2–3.9)
Total Protein ELP: 7.1 g/dL (ref 6.0–8.5)

## 2016-03-26 LAB — KAPPA/LAMBDA LIGHT CHAINS
Kappa free light chain: 36.07 mg/L — ABNORMAL HIGH (ref 3.30–19.40)
Kappa, lambda light chain ratio: 1.29 (ref 0.26–1.65)
Lambda free light chains: 28.02 mg/L — ABNORMAL HIGH (ref 5.71–26.30)

## 2016-03-27 ENCOUNTER — Encounter: Payer: Self-pay | Admitting: Hematology and Oncology

## 2016-03-30 ENCOUNTER — Telehealth: Payer: Self-pay | Admitting: *Deleted

## 2016-03-30 MED ORDER — TRAMADOL HCL 50 MG PO TABS: 50.0000 mg | ORAL_TABLET | Freq: Three times a day (TID) | ORAL | Status: DC | PRN

## 2016-03-30 NOTE — Telephone Encounter (Signed)
Faxed rx

## 2016-04-01 ENCOUNTER — Inpatient Hospital Stay: Payer: BLUE CROSS/BLUE SHIELD

## 2016-04-16 ENCOUNTER — Other Ambulatory Visit: Payer: Self-pay | Admitting: Hematology and Oncology

## 2016-04-16 ENCOUNTER — Telehealth: Payer: Self-pay | Admitting: *Deleted

## 2016-04-16 NOTE — Telephone Encounter (Signed)
Need auth number for Revlimid prescription sent to them, please call with it

## 2016-04-22 ENCOUNTER — Inpatient Hospital Stay: Payer: BLUE CROSS/BLUE SHIELD | Attending: Hematology and Oncology

## 2016-04-22 DIAGNOSIS — E538 Deficiency of other specified B group vitamins: Secondary | ICD-10-CM | POA: Diagnosis not present

## 2016-04-22 MED ORDER — CYANOCOBALAMIN 1000 MCG/ML IJ SOLN
1000.0000 ug | Freq: Once | INTRAMUSCULAR | Status: AC
  Administered 2016-04-22: 1000 ug via INTRAMUSCULAR
  Filled 2016-04-22: qty 1

## 2016-04-26 ENCOUNTER — Inpatient Hospital Stay: Payer: BLUE CROSS/BLUE SHIELD

## 2016-04-26 DIAGNOSIS — E538 Deficiency of other specified B group vitamins: Secondary | ICD-10-CM | POA: Diagnosis not present

## 2016-04-26 DIAGNOSIS — C9001 Multiple myeloma in remission: Secondary | ICD-10-CM

## 2016-04-26 LAB — COMPREHENSIVE METABOLIC PANEL
ALT: 26 U/L (ref 17–63)
AST: 33 U/L (ref 15–41)
Albumin: 4 g/dL (ref 3.5–5.0)
Alkaline Phosphatase: 53 U/L (ref 38–126)
Anion gap: 8 (ref 5–15)
BUN: 16 mg/dL (ref 6–20)
CO2: 22 mmol/L (ref 22–32)
Calcium: 8.5 mg/dL — ABNORMAL LOW (ref 8.9–10.3)
Chloride: 107 mmol/L (ref 101–111)
Creatinine, Ser: 1.1 mg/dL (ref 0.61–1.24)
GFR calc Af Amer: >60 mL/min (ref >60)
GFR calc non Af Amer: >60 mL/min (ref >60)
Glucose, Bld: 154 mg/dL — ABNORMAL HIGH (ref 65–99)
Potassium: 3.7 mmol/L (ref 3.5–5.1)
Sodium: 137 mmol/L (ref 135–145)
Total Bilirubin: 0.8 mg/dL (ref 0.3–1.2)
Total Protein: 7.8 g/dL (ref 6.5–8.1)

## 2016-04-26 LAB — CBC WITH DIFFERENTIAL/PLATELET
Basophils Absolute: 0.1 10*3/uL (ref 0–0.1)
Basophils Relative: 3 %
Eosinophils Absolute: 0.3 10*3/uL (ref 0–0.7)
Eosinophils Relative: 9 %
HCT: 37.6 % — ABNORMAL LOW (ref 40.0–52.0)
Hemoglobin: 13.4 g/dL (ref 13.0–18.0)
Lymphocytes Relative: 33 %
Lymphs Abs: 1.2 10*3/uL (ref 1.0–3.6)
MCH: 36.7 pg — ABNORMAL HIGH (ref 26.0–34.0)
MCHC: 35.6 g/dL (ref 32.0–36.0)
MCV: 103.2 fL — ABNORMAL HIGH (ref 80.0–100.0)
Monocytes Absolute: 0.5 10*3/uL (ref 0.2–1.0)
Monocytes Relative: 14 %
Neutro Abs: 1.5 10*3/uL (ref 1.4–6.5)
Neutrophils Relative %: 41 %
Platelets: 195 10*3/uL (ref 150–440)
RBC: 3.64 MIL/uL — ABNORMAL LOW (ref 4.40–5.90)
RDW: 13.8 % (ref 11.5–14.5)
WBC: 3.6 10*3/uL — ABNORMAL LOW (ref 3.8–10.6)

## 2016-04-28 ENCOUNTER — Other Ambulatory Visit: Payer: Self-pay | Admitting: *Deleted

## 2016-04-29 ENCOUNTER — Other Ambulatory Visit: Payer: Self-pay

## 2016-04-29 NOTE — Telephone Encounter (Signed)
Sent 6/9

## 2016-05-14 ENCOUNTER — Other Ambulatory Visit: Payer: Self-pay | Admitting: Hematology and Oncology

## 2016-05-20 ENCOUNTER — Inpatient Hospital Stay: Payer: BLUE CROSS/BLUE SHIELD | Attending: Hematology and Oncology

## 2016-05-20 ENCOUNTER — Telehealth: Payer: Self-pay

## 2016-05-20 DIAGNOSIS — Z87891 Personal history of nicotine dependence: Secondary | ICD-10-CM | POA: Insufficient documentation

## 2016-05-20 DIAGNOSIS — K219 Gastro-esophageal reflux disease without esophagitis: Secondary | ICD-10-CM | POA: Insufficient documentation

## 2016-05-20 DIAGNOSIS — I110 Hypertensive heart disease with heart failure: Secondary | ICD-10-CM | POA: Insufficient documentation

## 2016-05-20 DIAGNOSIS — C9000 Multiple myeloma not having achieved remission: Secondary | ICD-10-CM | POA: Insufficient documentation

## 2016-05-20 DIAGNOSIS — K5730 Diverticulosis of large intestine without perforation or abscess without bleeding: Secondary | ICD-10-CM | POA: Insufficient documentation

## 2016-05-20 DIAGNOSIS — I2510 Atherosclerotic heart disease of native coronary artery without angina pectoris: Secondary | ICD-10-CM | POA: Insufficient documentation

## 2016-05-20 DIAGNOSIS — I252 Old myocardial infarction: Secondary | ICD-10-CM | POA: Insufficient documentation

## 2016-05-20 DIAGNOSIS — Z7982 Long term (current) use of aspirin: Secondary | ICD-10-CM | POA: Insufficient documentation

## 2016-05-20 DIAGNOSIS — F419 Anxiety disorder, unspecified: Secondary | ICD-10-CM | POA: Insufficient documentation

## 2016-05-20 DIAGNOSIS — M1990 Unspecified osteoarthritis, unspecified site: Secondary | ICD-10-CM | POA: Insufficient documentation

## 2016-05-20 DIAGNOSIS — I10 Essential (primary) hypertension: Secondary | ICD-10-CM | POA: Insufficient documentation

## 2016-05-20 DIAGNOSIS — E538 Deficiency of other specified B group vitamins: Secondary | ICD-10-CM | POA: Insufficient documentation

## 2016-05-20 DIAGNOSIS — D125 Benign neoplasm of sigmoid colon: Secondary | ICD-10-CM | POA: Insufficient documentation

## 2016-05-20 DIAGNOSIS — Z79899 Other long term (current) drug therapy: Secondary | ICD-10-CM | POA: Insufficient documentation

## 2016-05-20 DIAGNOSIS — I509 Heart failure, unspecified: Secondary | ICD-10-CM | POA: Insufficient documentation

## 2016-05-20 DIAGNOSIS — E785 Hyperlipidemia, unspecified: Secondary | ICD-10-CM | POA: Insufficient documentation

## 2016-05-20 DIAGNOSIS — F329 Major depressive disorder, single episode, unspecified: Secondary | ICD-10-CM | POA: Insufficient documentation

## 2016-05-20 DIAGNOSIS — R0602 Shortness of breath: Secondary | ICD-10-CM | POA: Insufficient documentation

## 2016-05-20 DIAGNOSIS — G4730 Sleep apnea, unspecified: Secondary | ICD-10-CM | POA: Insufficient documentation

## 2016-05-20 DIAGNOSIS — Z7984 Long term (current) use of oral hypoglycemic drugs: Secondary | ICD-10-CM | POA: Insufficient documentation

## 2016-05-20 DIAGNOSIS — E119 Type 2 diabetes mellitus without complications: Secondary | ICD-10-CM | POA: Insufficient documentation

## 2016-05-20 DIAGNOSIS — M4800 Spinal stenosis, site unspecified: Secondary | ICD-10-CM | POA: Insufficient documentation

## 2016-05-20 NOTE — Telephone Encounter (Signed)
Called pt back and he states his wife is going to retire and they are looking at another Set designer and spoke to representative about medicare advantage plan through [place] team advantage plan. He was not told if his pharmacy will change but he was told by the representative that his cost for 1 month of revlimid 13,000 dollars and he was interested in checking to see if he could get some help with the cost. I told him that I can check to see if [facility] works with copay asst funds or not and if not that we can get him an appt for jack the Education officer, museum and for pt to keep in mind that all of these copay foundations approve asst based on how much income the patient and wife make and bills that they have so i can't promise anything but I will check into it and call him back. He appreciates my helpfulness

## 2016-05-20 NOTE — Telephone Encounter (Signed)
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  Has questions regarding how he can keep the cost down with new insurance

## 2016-05-25 ENCOUNTER — Telehealth: Payer: Self-pay | Admitting: *Deleted

## 2016-05-25 ENCOUNTER — Ambulatory Visit
Admission: RE | Admit: 2016-05-25 | Discharge: 2016-05-25 | Disposition: A | Discharge status: Home / Self Care | Payer: BLUE CROSS/BLUE SHIELD | Source: Ambulatory Visit | Attending: Hematology and Oncology | Admitting: Hematology and Oncology

## 2016-05-25 DIAGNOSIS — C9001 Multiple myeloma in remission: Secondary | ICD-10-CM | POA: Diagnosis not present

## 2016-05-25 NOTE — Telephone Encounter (Signed)
rcvd fax for refill of tramadol and potassium. Refills signed and faxed bak tohis pharmacy.

## 2016-05-25 NOTE — Telephone Encounter (Signed)
Pt called me on Friday last week and told me that he is on his wife insurance plan and she is going to retire and they have spoke to another insurance provider for new policy and found health Team Advantage plan. It is a medicare advantage plan.  He was told by the representative that came out to give them info about the plan that when he ran the meds he is taking the revlimid cost was 13,000.00 per month and pt can't afford that so he wanted me to call around and see if he would qualify for copay asst.  I told him it will take a few days to get time to call but I would and when I called [facility] and got trf to revlimid dept.  They had never heard of the plan he is switching to .  At the end until they have rx for him with insurance info that they can run they will not know what his cost is.  There are 3 major copay funds that they can help him with but all funds are specific to dx and drug and how much income they make and how many people in household and that would determine if he would qualify.  They suggested asking rep do they have drug tiers and if so which one would revlmid be on and what is estimate of cost after benefits of insurance pays.  I have asked him to call me back and I don't think I have the answer he is looking for before he gets insurance.

## 2016-05-28 ENCOUNTER — Inpatient Hospital Stay: Payer: BLUE CROSS/BLUE SHIELD

## 2016-05-28 ENCOUNTER — Inpatient Hospital Stay (HOSPITAL_BASED_OUTPATIENT_CLINIC_OR_DEPARTMENT_OTHER): Payer: BLUE CROSS/BLUE SHIELD | Admitting: Hematology and Oncology

## 2016-05-28 ENCOUNTER — Other Ambulatory Visit: Payer: Self-pay | Admitting: Hematology and Oncology

## 2016-05-28 ENCOUNTER — Encounter: Payer: Self-pay | Admitting: Hematology and Oncology

## 2016-05-28 VITALS — BP 138/76 | HR 65 | Temp 99.4°F | Resp 18 | Wt 204.6 lb

## 2016-05-28 DIAGNOSIS — E538 Deficiency of other specified B group vitamins: Secondary | ICD-10-CM | POA: Diagnosis not present

## 2016-05-28 DIAGNOSIS — Z79899 Other long term (current) drug therapy: Secondary | ICD-10-CM

## 2016-05-28 DIAGNOSIS — M4800 Spinal stenosis, site unspecified: Secondary | ICD-10-CM

## 2016-05-28 DIAGNOSIS — K5730 Diverticulosis of large intestine without perforation or abscess without bleeding: Secondary | ICD-10-CM

## 2016-05-28 DIAGNOSIS — R0602 Shortness of breath: Secondary | ICD-10-CM

## 2016-05-28 DIAGNOSIS — C9000 Multiple myeloma not having achieved remission: Secondary | ICD-10-CM | POA: Diagnosis not present

## 2016-05-28 DIAGNOSIS — G4730 Sleep apnea, unspecified: Secondary | ICD-10-CM

## 2016-05-28 DIAGNOSIS — F329 Major depressive disorder, single episode, unspecified: Secondary | ICD-10-CM | POA: Diagnosis not present

## 2016-05-28 DIAGNOSIS — I509 Heart failure, unspecified: Secondary | ICD-10-CM | POA: Diagnosis not present

## 2016-05-28 DIAGNOSIS — I2510 Atherosclerotic heart disease of native coronary artery without angina pectoris: Secondary | ICD-10-CM | POA: Diagnosis not present

## 2016-05-28 DIAGNOSIS — M1990 Unspecified osteoarthritis, unspecified site: Secondary | ICD-10-CM | POA: Diagnosis not present

## 2016-05-28 DIAGNOSIS — Z7982 Long term (current) use of aspirin: Secondary | ICD-10-CM

## 2016-05-28 DIAGNOSIS — Z7984 Long term (current) use of oral hypoglycemic drugs: Secondary | ICD-10-CM

## 2016-05-28 DIAGNOSIS — K219 Gastro-esophageal reflux disease without esophagitis: Secondary | ICD-10-CM | POA: Diagnosis not present

## 2016-05-28 DIAGNOSIS — Z87891 Personal history of nicotine dependence: Secondary | ICD-10-CM

## 2016-05-28 DIAGNOSIS — I10 Essential (primary) hypertension: Secondary | ICD-10-CM

## 2016-05-28 DIAGNOSIS — F419 Anxiety disorder, unspecified: Secondary | ICD-10-CM | POA: Diagnosis not present

## 2016-05-28 DIAGNOSIS — I252 Old myocardial infarction: Secondary | ICD-10-CM

## 2016-05-28 DIAGNOSIS — I110 Hypertensive heart disease with heart failure: Secondary | ICD-10-CM | POA: Diagnosis not present

## 2016-05-28 DIAGNOSIS — E785 Hyperlipidemia, unspecified: Secondary | ICD-10-CM | POA: Diagnosis not present

## 2016-05-28 DIAGNOSIS — D125 Benign neoplasm of sigmoid colon: Secondary | ICD-10-CM | POA: Diagnosis not present

## 2016-05-28 DIAGNOSIS — E119 Type 2 diabetes mellitus without complications: Secondary | ICD-10-CM | POA: Diagnosis not present

## 2016-05-28 DIAGNOSIS — C9001 Multiple myeloma in remission: Secondary | ICD-10-CM

## 2016-05-28 LAB — COMPREHENSIVE METABOLIC PANEL
ALT: 33 U/L (ref 17–63)
AST: 41 U/L (ref 15–41)
Albumin: 4.1 g/dL (ref 3.5–5.0)
Alkaline Phosphatase: 57 U/L (ref 38–126)
Anion gap: 8 (ref 5–15)
BUN: 13 mg/dL (ref 6–20)
CO2: 22 mmol/L (ref 22–32)
Calcium: 9.3 mg/dL (ref 8.9–10.3)
Chloride: 107 mmol/L (ref 101–111)
Creatinine, Ser: 0.81 mg/dL (ref 0.61–1.24)
GFR calc Af Amer: >60 mL/min (ref >60)
GFR calc non Af Amer: >60 mL/min (ref >60)
Glucose, Bld: 167 mg/dL — ABNORMAL HIGH (ref 65–99)
Potassium: 4.2 mmol/L (ref 3.5–5.1)
Sodium: 137 mmol/L (ref 135–145)
Total Bilirubin: 0.7 mg/dL (ref 0.3–1.2)
Total Protein: 7.9 g/dL (ref 6.5–8.1)

## 2016-05-28 LAB — CBC WITH DIFFERENTIAL/PLATELET
Basophils Absolute: 0 10*3/uL (ref 0–0.1)
Basophils Relative: 0 %
Eosinophils Absolute: 0.3 10*3/uL (ref 0–0.7)
Eosinophils Relative: 9 %
HCT: 36.7 % — ABNORMAL LOW (ref 40.0–52.0)
Hemoglobin: 13.1 g/dL (ref 13.0–18.0)
Lymphocytes Relative: 34 %
Lymphs Abs: 1.1 10*3/uL (ref 1.0–3.6)
MCH: 37.1 pg — ABNORMAL HIGH (ref 26.0–34.0)
MCHC: 35.7 g/dL (ref 32.0–36.0)
MCV: 103.8 fL — ABNORMAL HIGH (ref 80.0–100.0)
Monocytes Absolute: 0.5 10*3/uL (ref 0.2–1.0)
Monocytes Relative: 14 %
Neutro Abs: 1.4 10*3/uL (ref 1.4–6.5)
Neutrophils Relative %: 43 %
Platelets: 167 10*3/uL (ref 150–440)
RBC: 3.54 MIL/uL — ABNORMAL LOW (ref 4.40–5.90)
RDW: 13.1 % (ref 11.5–14.5)
WBC: 3.3 10*3/uL — ABNORMAL LOW (ref 3.8–10.6)

## 2016-05-28 MED ORDER — CYANOCOBALAMIN 1000 MCG/ML IJ SOLN
1000.0000 ug | Freq: Once | INTRAMUSCULAR | Status: AC
  Administered 2016-05-28: 1000 ug via INTRAMUSCULAR

## 2016-05-28 NOTE — Progress Notes (Signed)
Patient is here for follow up, no complaints  

## 2016-05-28 NOTE — Progress Notes (Signed)
[facility] day:  05/28/2016   Chief Complaint: Terel Bann. is a 70 y.o. male with multiple myeloma on Revlimid who is seen for 2 month assessment on Revlimid prior to monthly B12.  HPI: The patient was last seen in the medical oncology clinic on 03/25/2016.  At that time, he was doing well.  Exam was stable.  Hypocalcemia had resolved with increased calcium supplementation.  He received his monthly B12.  He continued his Revlimid every other day.  Bone survey on 05/25/2016 revealed no evidence of progressive myelomatous involvement of the skeleton.  There were stable lytic foci in the right humerus, bilateral inferior pubic rami, and in both femurs.      During the interim, he has done well.  He voices no new complaints.  He denies any bone pain, fevers or infections.  Past Medical History  Diagnosis Date  . Hypertension   . Spinal stenosis   . GERD (gastroesophageal reflux disease)   . CHF (congestive heart failure) ([place])   . Arthritis   . Angina pectoris ([place])   . Hyperlipidemia   . Multiple myeloma ([place])   . Multiple myeloma ([place]) 03/27/2015  . Diabetes mellitus without complication [facility])     Patient takes Metformin.  . Sleep apnea     No CPAP @ present  . Shortness of breath dyspnea   . Anxiety   . Depression   . Coronary artery disease   . Diverticulosis   . Myocardial infarction [facility]) April 2001    widowmaker    Past  Surgical History  Procedure Laterality Date  . Rotator cuff repair    . Coronary artery bypass graft    . Pilonidal cyst excision    . Total hip arthroplasty Right   . Cataract extraction    . Inguinal hernia repair    . Carpal tunnel release    . Cardiac catheterization    . Joint replacement Right 2008    Right Total Hip Replacement  . Eye surgery Bilateral     Cataract Extraction  . Ventral hernia repair N/A 08/15/2015    Procedure: VENTRAL HERNIA REPAIR WITH MESH ;  Surgeon: Leonie Green, MD;  Location: [facility] ORS;  Service: General;  Laterality: N/A;  . Colonoscopy with propofol N/A 11/11/2015    Procedure: COLONOSCOPY WITH PROPOFOL;  Surgeon: Lollie Sails, MD;  Location[facility] ENDOSCOPY;  Service: Endoscopy;  Laterality: N/A;    Family History  Problem Relation Age of Onset  . Heart disease Father   . Stroke Mother   . Prostate cancer Maternal Grandfather 47    Social History:  reports that he quit smoking about 16 years ago. His smoking use included Cigarettes. He has a 60 pack-year smoking history. He quit smokeless tobacco use about 16 years ago. He reports that he does not drink alcohol or use illicit drugs.  He notes that his wife goes off to work.  He states that his wife would like to retire, but that would affect his medication coverage.  He has projects that he likes to do at home.  He does wood working.  His 42nd high school graduation anniversary is coming up in 06/2016.  The patient is alone today.  Allergies:  Allergies  Allergen Reactions  . Pravachol [Pravastatin]   . Pravastatin Sodium Other (See Comments)  . Statins Other (See Comments)    Muscle aches  . Zocor [Simvastatin] Other (See Comments)    Muscle aches    Current Medications: Current Outpatient Prescriptions  Medication Sig Dispense Refill  . ALPRAZolam (XANAX) 0.25 MG tablet Take 0.25 mg by mouth at bedtime as needed. for sleep  3  . aspirin 81 MG tablet Take 81 mg by mouth daily.      [name] Pollen 500 MG CHEW Chew 1 tablet by mouth daily.    . Calcium Carb-Cholecalciferol 600-200 MG-UNIT TABS Take 1 tablet by mouth daily.    . ergocalciferol (VITAMIN D2) 50000 UNITS capsule Take 50,000 Units by mouth once a week.    Marland Kitchen HYDROcodone-acetaminophen (NORCO) 5-325 MG tablet Take 1-2 tablets by mouth every 4 (four) hours as needed for moderate pain. 20 tablet 0  . KLOR-CON M20 20 MEQ tablet TAKE 1 TABLET (20 MEQ TOTAL) BY MOUTH DAILY. 30 tablet 2  . lisinopril (PRINIVIL,ZESTRIL) 20 MG tablet Take 1  tablet by mouth daily.    Marland Kitchen lovastatin (MEVACOR) 40 MG tablet Take 40 mg by mouth at bedtime.    . metFORMIN (GLUCOPHAGE) 500 MG tablet Take 500 mg by mouth daily with breakfast.    . omeprazole (PRILOSEC) 20 MG capsule Take 20 mg by mouth daily.    Marland Kitchen REVLIMID 10 MG capsule TAKE 1 CAPSULE (10MG) BY MOUTH EVERY OTHER DAY 14 capsule 1  . traMADol (ULTRAM) 50 MG tablet Take 1 tablet (50 mg total) by mouth 3 (three) times daily as needed. 60 tablet 0  . triamcinolone cream (KENALOG) 0.1 %     . vitamin B-12 (CYANOCOBALAMIN) 1000 MCG tablet Take 1,000 mcg by mouth. 2-3 times a week     No current facility-administered medications for this visit.    Review of Systems:  GENERAL:  Feels good.  No fevers or sweats.  Weight down 2 pounds (tryong to lose weight). PERFORMANCE STATUS (ECOG): 1 HEENT: No visual changes, runny nose, sore throat, mouth sores or tenderness. Lungs: No shortness of breath or cough. No hemoptysis. Cardiac: No chest pain, palpitations, orthopnea, or PND.  Pre-op echo and stress test good. GI: Diarrhea alternating with constipation. No nausea, vomiting, constipation, melena or hematochezia. GU: No urgency, frequency, dysuria, or hematuria. Musculoskeletal: Spinal stenosis causing legs aching. No joint pain. No muscle tenderness. Extremities: No pain or swelling. Skin: No rashes or skin changes. Neuro: No headache, numbness or weakness, balance or  coordination issues. Endocrine: Diabetes.  No tthyroid issues, hot flashes or night sweats. Psych: No mood changes, depression or anxiety. Pain: No focal pain. Review of systems: All other systems reviewed and found to be negative.  Physical Exam: Blood pressure 138/76, pulse 65, temperature 99.4 F (37.4 C), temperature source Tympanic, resp. rate 18, weight 204 lb 9.4 oz (92.8 kg).  GENERAL: Well developed, well nourished, elderly gentleman sitting comfortably in the exam room in no acute distress. MENTAL STATUS: Alert and oriented to person, place and time. HEAD: Short gray hair. Male pattern baldness. Normocephalic, atraumatic, face symmetric, no Cushingoid features. EYES: Blue eyes. Pupils equal round and reactive to light and accomodation. No conjunctivitis or scleral icterus. ENT: Oropharynx clear without lesion. Tongue normal. Mucous membranes moist.  RESPIRATORY: Clear to auscultation without rales, wheezes or rhonchi. CARDIOVASCULAR: Regular rate and rhythm without murmur, rub or gallop. ABDOMEN: Soft, non-tender, with active bowel sounds, and no hepatosplenomegaly. No masses. SKIN: No rashes, ulcers or lesions. EXTREMITIES: No edema, no skin discoloration or tenderness. No palpable cords. LYMPH NODES: No palpable cervical, supraclavicular, axillary or inguinal adenopathy  NEUROLOGICAL: Unremarkable. PSYCH: Appropriate   Appointment on 05/28/2016  Component Date Value Ref Range Status  . WBC 05/28/2016 3.3* 3.8 - 10.6 K/uL Final  . RBC 05/28/2016 3.54* 4.40 - 5.90 MIL/uL Final  . Hemoglobin 05/28/2016 13.1  13.0 - 18.0 g/dL Final  . HCT 05/28/2016 36.7* 40.0 - 52.0 % Final  . MCV 05/28/2016 103.8* 80.0 - 100.0 fL Final  . MCH 05/28/2016 37.1* 26.0 - 34.0 pg Final  . MCHC 05/28/2016 35.7  32.0 - 36.0 g/dL Final  . RDW 05/28/2016 13.1  11.5 - 14.5 % Final  . Platelets 05/28/2016 167  150 - 440 K/uL Final  . Neutrophils Relative % 05/28/2016 43   Final   . Neutro Abs 05/28/2016 1.4  1.4 - 6.5 K/uL Final  . Lymphocytes Relative 05/28/2016 34   Final  . Lymphs Abs 05/28/2016 1.1  1.0 - 3.6 K/uL Final  . Monocytes Relative 05/28/2016  14   Final  . Monocytes Absolute 05/28/2016 0.5  0.2 - 1.0 K/uL Final  . Eosinophils Relative 05/28/2016 9   Final  . Eosinophils Absolute 05/28/2016 0.3  0 - 0.7 K/uL Final  . Basophils Relative 05/28/2016 0   Final  . Basophils Absolute 05/28/2016 0.0  0 - 0.1 K/uL Final  . Sodium 05/28/2016 137  135 - 145 mmol/L Final  . Potassium 05/28/2016 4.2  3.5 - 5.1 mmol/L Final  . Chloride 05/28/2016 107  101 - 111 mmol/L Final  . CO2 05/28/2016 22  22 - 32 mmol/L Final  . Glucose, Bld 05/28/2016 167* 65 - 99 mg/dL Final  . BUN 05/28/2016 13  6 - 20 mg/dL Final  . Creatinine, Ser 05/28/2016 0.81  0.61 - 1.24 mg/dL Final  . Calcium 05/28/2016 9.3  8.9 - 10.3 mg/dL Final  . Total Protein 05/28/2016 7.9  6.5 - 8.1 g/dL Final  . Albumin 05/28/2016 4.1  3.5 - 5.0 g/dL Final  . AST 05/28/2016 41  15 - 41 U/L Final  . ALT 05/28/2016 33  17 - 63 U/L Final  . Alkaline Phosphatase 05/28/2016 57  38 - 126 U/L Final  . Total Bilirubin 05/28/2016 0.7  0.3 - 1.2 mg/dL Final  . GFR calc non Af Amer 05/28/2016 >60  >60 mL/min Final  . GFR calc Af Amer 05/28/2016 >60  >60 mL/min Final   Comment: (NOTE) The eGFR has been calculated using the CKD EPI equation. This calculation has not been validated in all clinical situations. eGFR's persistently <60 mL/min signify possible Chronic Kidney Disease.   Georgiann Hahn gap 05/28/2016 8  5 - 15 Final    Assessment:  Jermond Burkemper. is a 70 y.o. male with stage III multiple myeloma. He presented in 02/2013 with left-sided sharp pain. Evaluation revealed wide-spread lytic lesions including fracture of the left 5th rib laterally. CT scans on 02/22/2013 showed innumerable lytic lesions in thoracic spine, sternum, clavicle, scapula, and ribs.   Bone marrow biopsy on 03/13/2013 revealed 15  to 20% plasma cells. Iron stores were absent. Renal function was normal. SPEP on 03/01/2013 revealed a 1.4 gm/dL IgG monoclonal lambda. IgG was 1987.   He began induction with Revlimid 25 mg a day (3 weeks on and 1 week off) and Decadron (40 mg once a week) on 04/09/2013. SPEP has revealed no monoclonal protein since 07/26/2014 (last check 03/25/2016). IgG was 1362 on 07/26/2014 and 1097 on 12/19/2014. Light chains have been monitored: lambda free light chains 33.20 (ratio of 1.56) on 09/20/2014, 31.51 (ratio of 1.43) on 12/19/2014, 30.25 (ratio of 1.56) on 05/01/2015, 31.95 (ratio 1.49) on 01/01/2016, and 28.02 (ratio 1.29) on 03/25/2016.  24 hour UPEP on 12/02/2015 revealed no monoclonal protein.  With remission, Revlimid was decreased to 10 mg a day then to 10 mg every other day secondary to issues with renal function and diarrhea.   Bone survey on 05/30/2015 revealed the majority of small lytic lesions were stable.  There was possibly new lesion in the distal left clavicle and right mid femur (small) and a possible developing lucency in the proximal left femoral shaft.  The calvarial lesion noted on the prior study was not well seen (? positional).  Bone survey on 11/27/2015 revealed widespread bony lytic lesions consistent with multiple myeloma.  The vast majority of lesions were stable.  A few small lesions were not previously seen.  One lesion was previously obscured by bowel contents.  Two small lesions in  the right distal femoral diaphysis appeared new.  Bone survey on 05/25/2016 revealed no evidence of progressive myelomatous involvement of the skeleton.  He receives Zometa every 3 months (last 03/25/2016). He takes calcium 600 mg BID. He receives B12 monthly (last 04/22/2016). B12 was 370 on 01/17/2015.  Abdominal and pelvic CT scan on 07/15/2015 revealed  extensive sigmoid diverticulosis. There was questionable wall thickening of the ascending colon versus artifact from incomplete  distention.  Colonoscopy on 11/11/2015 revealed diverticulosis in the sigmoid colon and distal descending colon and ascending colon.  There was a 2 mm polyp in the mid sigmoid colon.  Pathology revealed a hyperplastic polyp, negative for dysplasia or malignancy.  Symptomatically, he is doing well.  Exam is stable.  Calcium is normal.  Plan: 1.  Labs today:  CBC with diff, CMP, SPEP, FLCA. 2.  Review interval bone survey. 3.  Continue Revlimid 10 mg QOD. 4.  B12 today and monthly. 4.  Continue increased calcium supplimentation. 5.  RTC in 1 month for MD assessment, labs (CBC with diff, BMP), and Zometa + B12.   Lequita Asal, MD  05/28/2016, 3:18 PM

## 2016-05-30 ENCOUNTER — Other Ambulatory Visit: Payer: Self-pay | Admitting: *Deleted

## 2016-05-30 DIAGNOSIS — E538 Deficiency of other specified B group vitamins: Secondary | ICD-10-CM

## 2016-05-30 DIAGNOSIS — C9000 Multiple myeloma not having achieved remission: Secondary | ICD-10-CM

## 2016-05-31 LAB — KAPPA/LAMBDA LIGHT CHAINS
Kappa free light chain: 39.6 mg/L — ABNORMAL HIGH (ref 3.3–19.4)
Kappa, lambda light chain ratio: 1.26 (ref 0.26–1.65)
Lambda free light chains: 31.5 mg/L — ABNORMAL HIGH (ref 5.7–26.3)

## 2016-05-31 LAB — PROTEIN ELECTROPHORESIS, SERUM
A/G Ratio: 1.1 (ref 0.7–1.7)
Albumin ELP: 3.8 g/dL (ref 2.9–4.4)
Alpha-1-Globulin: 0.2 g/dL (ref 0.0–0.4)
Alpha-2-Globulin: 0.9 g/dL (ref 0.4–1.0)
Beta Globulin: 1.5 g/dL — ABNORMAL HIGH (ref 0.7–1.3)
Gamma Globulin: 1.1 g/dL (ref 0.4–1.8)
Globulin, Total: 3.6 g/dL (ref 2.2–3.9)
Total Protein ELP: 7.4 g/dL (ref 6.0–8.5)

## 2016-06-16 ENCOUNTER — Other Ambulatory Visit: Payer: Self-pay | Admitting: Hematology and Oncology

## 2016-06-25 ENCOUNTER — Ambulatory Visit: Payer: BLUE CROSS/BLUE SHIELD | Admitting: Hematology and Oncology

## 2016-06-25 ENCOUNTER — Inpatient Hospital Stay (HOSPITAL_BASED_OUTPATIENT_CLINIC_OR_DEPARTMENT_OTHER): Payer: BLUE CROSS/BLUE SHIELD | Admitting: Hematology and Oncology

## 2016-06-25 ENCOUNTER — Ambulatory Visit: Payer: BLUE CROSS/BLUE SHIELD

## 2016-06-25 ENCOUNTER — Other Ambulatory Visit: Payer: BLUE CROSS/BLUE SHIELD

## 2016-06-25 ENCOUNTER — Encounter: Payer: Self-pay | Admitting: Hematology and Oncology

## 2016-06-25 ENCOUNTER — Inpatient Hospital Stay: Payer: BLUE CROSS/BLUE SHIELD | Attending: Hematology and Oncology

## 2016-06-25 ENCOUNTER — Inpatient Hospital Stay: Payer: BLUE CROSS/BLUE SHIELD

## 2016-06-25 VITALS — BP 132/66 | HR 59 | Temp 97.3°F | Resp 18 | Wt 206.4 lb

## 2016-06-25 DIAGNOSIS — E119 Type 2 diabetes mellitus without complications: Secondary | ICD-10-CM

## 2016-06-25 DIAGNOSIS — D125 Benign neoplasm of sigmoid colon: Secondary | ICD-10-CM | POA: Insufficient documentation

## 2016-06-25 DIAGNOSIS — I10 Essential (primary) hypertension: Secondary | ICD-10-CM

## 2016-06-25 DIAGNOSIS — E785 Hyperlipidemia, unspecified: Secondary | ICD-10-CM

## 2016-06-25 DIAGNOSIS — Z87891 Personal history of nicotine dependence: Secondary | ICD-10-CM | POA: Diagnosis not present

## 2016-06-25 DIAGNOSIS — C9000 Multiple myeloma not having achieved remission: Secondary | ICD-10-CM | POA: Insufficient documentation

## 2016-06-25 DIAGNOSIS — K219 Gastro-esophageal reflux disease without esophagitis: Secondary | ICD-10-CM | POA: Diagnosis not present

## 2016-06-25 DIAGNOSIS — E538 Deficiency of other specified B group vitamins: Secondary | ICD-10-CM

## 2016-06-25 DIAGNOSIS — M129 Arthropathy, unspecified: Secondary | ICD-10-CM

## 2016-06-25 DIAGNOSIS — I2510 Atherosclerotic heart disease of native coronary artery without angina pectoris: Secondary | ICD-10-CM | POA: Diagnosis not present

## 2016-06-25 DIAGNOSIS — F419 Anxiety disorder, unspecified: Secondary | ICD-10-CM | POA: Diagnosis not present

## 2016-06-25 DIAGNOSIS — M4800 Spinal stenosis, site unspecified: Secondary | ICD-10-CM | POA: Diagnosis not present

## 2016-06-25 DIAGNOSIS — Z79899 Other long term (current) drug therapy: Secondary | ICD-10-CM | POA: Diagnosis not present

## 2016-06-25 DIAGNOSIS — Z7982 Long term (current) use of aspirin: Secondary | ICD-10-CM | POA: Diagnosis not present

## 2016-06-25 DIAGNOSIS — I209 Angina pectoris, unspecified: Secondary | ICD-10-CM | POA: Diagnosis not present

## 2016-06-25 DIAGNOSIS — K5730 Diverticulosis of large intestine without perforation or abscess without bleeding: Secondary | ICD-10-CM

## 2016-06-25 DIAGNOSIS — F329 Major depressive disorder, single episode, unspecified: Secondary | ICD-10-CM | POA: Insufficient documentation

## 2016-06-25 DIAGNOSIS — I252 Old myocardial infarction: Secondary | ICD-10-CM | POA: Diagnosis not present

## 2016-06-25 DIAGNOSIS — I509 Heart failure, unspecified: Secondary | ICD-10-CM | POA: Insufficient documentation

## 2016-06-25 LAB — BASIC METABOLIC PANEL
Anion gap: 7 (ref 5–15)
BUN: 15 mg/dL (ref 6–20)
CO2: 24 mmol/L (ref 22–32)
Calcium: 8.6 mg/dL — ABNORMAL LOW (ref 8.9–10.3)
Chloride: 105 mmol/L (ref 101–111)
Creatinine, Ser: 0.88 mg/dL (ref 0.61–1.24)
GFR calc Af Amer: >60 mL/min (ref >60)
GFR calc non Af Amer: >60 mL/min (ref >60)
Glucose, Bld: 119 mg/dL — ABNORMAL HIGH (ref 65–99)
Potassium: 4 mmol/L (ref 3.5–5.1)
Sodium: 136 mmol/L (ref 135–145)

## 2016-06-25 MED ORDER — CYANOCOBALAMIN 1000 MCG/ML IJ SOLN
1000.0000 ug | Freq: Once | INTRAMUSCULAR | Status: AC
  Administered 2016-06-25: 1000 ug via INTRAMUSCULAR
  Filled 2016-06-25: qty 1

## 2016-06-25 NOTE — Progress Notes (Signed)
[facility] day:  06/25/16   Chief Complaint: Luis Lampert. is a 70 y.o. male with multiple myeloma on Revlimid who is seen for 1 month assessment on Revlimid prior to monthly B12 and every 3 month Zometa.  HPI: The patient was last seen in the medical oncology clinic on 05/28/2016.  At that time, he was doing well.  Exam was stable.  Calcium was normal.  He received B12.  Bone survey revealed no evidence of progressive disease.  He continued Revlimid QOD.  During the interim, he states that he has been off his Metformin for 2 weeks as his A1c and blood sugar has been good.  He notes less diarrhea off Metformin.  He takes calcium 1  1/2 pills twice a day.  He notes some soreness from his right ear to his shoulder which went away and may have been due to lifting.  He denies any bone pain.                                                                                                                                                                                                                                                       Past Medical History:  Diagnosis Date  . Angina pectoris ([place])   . Anxiety   . Arthritis   . CHF (congestive heart failure) ([place])   . Coronary artery disease   . Depression   . Diabetes mellitus without complication [facility])    Patient takes Metformin.  . Diverticulosis   . GERD (gastroesophageal reflux disease)   . Hyperlipidemia   . Hypertension   . Multiple myeloma ([place])   . Multiple myeloma ([place]) 03/27/2015  . Myocardial infarction [facility]) April 2001   widowmaker  . Shortness of breath dyspnea   . Sleep apnea    No CPAP @ present  . Spinal stenosis     Past Surgical History:  Procedure Laterality Date  . CARDIAC CATHETERIZATION    . CARPAL TUNNEL RELEASE    . CATARACT EXTRACTION    . COLONOSCOPY WITH PROPOFOL N/A 11/11/2015   Procedure: COLONOSCOPY WITH PROPOFOL;  Surgeon: Lollie Sails, MD;  Location:  [facility] ENDOSCOPY;  Service: Endoscopy;  Laterality: N/A;  . CORONARY ARTERY BYPASS GRAFT    . EYE SURGERY Bilateral  Cataract Extraction  . INGUINAL HERNIA REPAIR    . JOINT REPLACEMENT Right 2008   Right Total Hip Replacement  . PILONIDAL CYST EXCISION    . ROTATOR CUFF REPAIR    . TOTAL HIP ARTHROPLASTY Right   . VENTRAL HERNIA REPAIR N/A 08/15/2015   Procedure: VENTRAL HERNIA REPAIR WITH MESH ;  Surgeon: Leonie Green, MD;  Location: [facility] ORS;  Service: General;  Laterality: N/A;    Family History  Problem Relation Age of Onset  . Heart disease Father   . Stroke Mother   . Prostate cancer Maternal Grandfather 68    Social History:  reports that he quit smoking about 16 years ago. His smoking use included Cigarettes. He has a 60.00 pack-year smoking history. He quit smokeless tobacco use about 16 years ago. He reports that he does not drink alcohol or use drugs.  He notes that his wife goes off to work.  He states that his wife would like to retire, but that would affect his medication coverage.  He has projects that he likes to do at home.  He does wood working.  His 42nd high school graduation anniversary is coming up in 06/2016.  He will be traveling to [place] for a few days to see his new grand-daughter, [name].  The patient is alone today.  Allergies:  Allergies  Allergen Reactions  . Pravachol [Pravastatin]   . Pravastatin Sodium Other (See Comments)  . Statins Other (See Comments)    Muscle aches  . Zocor [Simvastatin] Other (See Comments)    Muscle aches    Current Medications: Current Outpatient Prescriptions  Medication Sig Dispense Refill  . acetaminophen (TYLENOL) 500 MG tablet Take 500 mg by mouth every 6 (six) hours as needed.    . ALPRAZolam (XANAX) 0.25 MG tablet Take 0.25 mg by mouth at bedtime as needed. for sleep  3  . aspirin 81 MG tablet Take 81 mg by mouth daily.     [name] Pollen 500 MG CHEW Chew 1 tablet by mouth daily.    . Calcium  Carb-Cholecalciferol 600-200 MG-UNIT TABS Take 1 tablet by mouth daily.    . Cyanocobalamin (VITAMIN B-12 IJ) Inject as directed.    . ergocalciferol (VITAMIN D2) 50000 UNITS capsule Take 50,000 Units by mouth once a week.    Marland Kitchen KLOR-CON M20 20 MEQ tablet TAKE 1 TABLET (20 MEQ TOTAL) BY MOUTH DAILY. 30 tablet 2  . lisinopril (PRINIVIL,ZESTRIL) 20 MG tablet Take 1 tablet by mouth daily.    Marland Kitchen lovastatin (MEVACOR) 40 MG tablet Take 40 mg by mouth at bedtime.    Marland Kitchen omeprazole (PRILOSEC) 20 MG capsule Take 20 mg by mouth daily.    Marland Kitchen REVLIMID 10 MG capsule TAKE 1 CAPSULE (10MG) BY MOUTH EVERY OTHER DAY 14 capsule 1  . traMADol (ULTRAM) 50 MG tablet Take 1 tablet (50 mg total) by mouth 3 (three) times daily as needed. 60 tablet 0  . HYDROcodone-acetaminophen (NORCO) 5-325 MG tablet Take 1-2 tablets by mouth every 4 (four) hours as needed for moderate pain. (Patient not taking: Reported on 06/25/2016) 20 tablet 0   No current facility-administered medications for this visit.     Review of Systems:  GENERAL:  Feels good.  No fevers or sweats.  Weight up 2 pounds. PERFORMANCE STATUS (ECOG): 1 HEENT: No visual changes, runny nose, sore throat, mouth sores or tenderness. Lungs: No shortness of breath or cough. No hemoptysis. Cardiac: No chest pain, palpitations, orthopnea, or PND.  Pre-op echo and stress test good. GI: Diarrhea less off Metformin. No nausea, vomiting, constipation, melena or hematochezia. GU: No urgency, frequency, dysuria, or hematuria. Musculoskeletal: Spinal stenosis causing legs aching. Transient right neck pain ? secondary to lifting.  No joint pain. No muscle tenderness. Extremities: No pain or swelling. Skin: No rashes or skin changes. Neuro: No headache, numbness or weakness, balance or coordination issues. Endocrine: Diabetes.  No tthyroid issues, hot flashes or night sweats. Psych: No mood changes, depression or anxiety. Pain: No focal pain. Review of  systems: All other systems reviewed and found to be negative.  Physical Exam: Blood pressure 132/66, pulse (!) 59, temperature 97.3 F (36.3 C), resp. rate 18, weight 206 lb 5.6 oz (93.6 kg).  GENERAL: Well developed, well nourished, elderly gentleman sitting comfortably in the exam room in no acute distress. MENTAL STATUS: Alert and oriented to person, place and time. HEAD: Wearing a cap.  Short gray hair. Male pattern baldness. Normocephalic, atraumatic, face symmetric, no Cushingoid features. EYES: Blue eyes. Pupils equal round and reactive to light and accomodation. No conjunctivitis or scleral icterus. ENT: Oropharynx clear without lesion. Tongue normal. Mucous membranes moist.  RESPIRATORY: Clear to auscultation without rales, wheezes or rhonchi. CARDIOVASCULAR: Regular rate and rhythm without murmur, rub or gallop. ABDOMEN: Soft, non-tender, with active bowel sounds, and no hepatosplenomegaly. No masses. SKIN: No rashes, ulcers or lesions. EXTREMITIES: No edema, no skin discoloration or tenderness. No palpable cords. LYMPH NODES: No palpable cervical, supraclavicular, axillary or inguinal adenopathy  NEUROLOGICAL: Unremarkable. PSYCH: Appropriate   Appointment on 06/25/2016  Component Date Value Ref Range Status  . Sodium 06/25/2016 136  135 - 145 mmol/L Final  . Potassium 06/25/2016 4.0  3.5 - 5.1 mmol/L Final  . Chloride 06/25/2016 105  101 - 111 mmol/L Final  . CO2 06/25/2016 24  22 - 32 mmol/L Final  . Glucose, Bld 06/25/2016 119* 65 - 99 mg/dL Final  . BUN 06/25/2016 15  6 - 20 mg/dL Final  . Creatinine, Ser 06/25/2016 0.88  0.61 - 1.24 mg/dL Final  . Calcium 06/25/2016 8.6* 8.9 - 10.3 mg/dL Final  . GFR calc non Af Amer 06/25/2016 >60  >60 mL/min Final  . GFR calc Af Amer 06/25/2016 >60  >60 mL/min Final   Comment: (NOTE) The eGFR has been calculated using the CKD EPI equation. This calculation has not been validated in all clinical  situations. eGFR's persistently <60 mL/min signify possible Chronic Kidney Disease.   Georgiann Hahn gap 06/25/2016 7  5 - 15 Final    Assessment:  Luis [name]. is a 70 y.o. male with stage III multiple myeloma. He presented in 02/2013 with left-sided sharp pain. Evaluation revealed wide-spread lytic lesions including fracture of the left 5th rib laterally. CT scans on 02/22/2013 showed innumerable lytic lesions in thoracic spine, sternum, clavicle, scapula, and ribs.   Bone marrow biopsy on 03/13/2013 revealed 15 to 20% plasma cells. Iron stores were absent. Renal function was normal. SPEP on 03/01/2013 revealed a 1.4 gm/dL IgG monoclonal lambda. IgG was 1987.   He began induction with Revlimid 25 mg a day (3 weeks on and 1 week off) and Decadron (40 mg once a week) on 04/09/2013. SPEP has revealed no monoclonal protein since 07/26/2014 (last check 05/28/2016). IgG was 1362 on 07/26/2014 and 1097 on 12/19/2014. Light chains have been monitored: lambda free light chains 33.20 (ratio of 1.56) on 09/20/2014, 31.51 (ratio of 1.43) on 12/19/2014, 30.25 (ratio of 1.56) on 05/01/2015, 31.95 (ratio  1.49) on 01/01/2016, 28.02 (ratio 1.29) on 03/25/2016, 31.5 (ratio 1.26) on 05/28/2016.  24 hour UPEP on 12/02/2015 revealed no monoclonal protein.  With remission, Revlimid was decreased to 10 mg a day then to 10 mg every other day secondary to issues with renal function and diarrhea.   Bone survey on 05/30/2015 revealed the majority of small lytic lesions were stable.  There was possibly new lesion in the distal left clavicle and right mid femur (small) and a possible developing lucency in the proximal left femoral shaft.  The calvarial lesion noted on the prior study was not well seen (? positional).  Bone survey on 11/27/2015 revealed widespread bony lytic lesions consistent with multiple myeloma.  The vast majority of lesions were stable.  A few small lesions were not previously seen.  One lesion  was previously obscured by bowel contents.  Two small lesions in the right distal femoral diaphysis appeared new.  Bone survey on 05/25/2016 revealed no evidence of progressive myelomatous involvement of the skeleton.  He receives Zometa every 3 months (last 03/25/2016). He takes calcium 600 mg BID. He receives B12 monthly (last 05/28/2016). B12 was 370 on 01/17/2015.  Abdominal and pelvic CT scan on 07/15/2015 revealed  extensive sigmoid diverticulosis. There was questionable wall thickening of the ascending colon versus artifact from incomplete distention.  Colonoscopy on 11/11/2015 revealed diverticulosis in the sigmoid colon and distal descending colon and ascending colon.  There was a 2 mm polyp in the mid sigmoid colon.  Pathology revealed a hyperplastic polyp, negative for dysplasia or malignancy.  Symptomatically, he is doing well.  Exam is stable.  He has mild hypocalcemia.  Plan: 1.  Labs today:  CBC with diff, BMP. 2.  No Zometa today. 3.  B12 today and monthly. 4.  Increase calcium from 750 mg BID to 1000 mg BID. 5.  Continue Revlimid 10 mg QOD. Patient states he will hold for 2 days for his upcoming trip to [place]. 6.  RTC in 1 month for labs (CBC with diff, CMP) and Zometa. 7.  RTC in 2 months for MD assessment, labs (CBC with diff, CMP, SPEP, free light chains), and B12.   Lequita Asal, MD  06/25/2016, 10:50 AM

## 2016-06-27 ENCOUNTER — Other Ambulatory Visit: Payer: Self-pay | Admitting: *Deleted

## 2016-06-27 DIAGNOSIS — C9000 Multiple myeloma not having achieved remission: Secondary | ICD-10-CM

## 2016-06-28 ENCOUNTER — Other Ambulatory Visit: Payer: Self-pay | Admitting: *Deleted

## 2016-06-28 NOTE — Telephone Encounter (Signed)
Sent already, but needs auth number

## 2016-07-01 ENCOUNTER — Other Ambulatory Visit: Payer: Self-pay | Admitting: *Deleted

## 2016-07-01 MED ORDER — POTASSIUM CHLORIDE CRYS ER 20 MEQ PO TBCR: EXTENDED_RELEASE_TABLET | ORAL | 2 refills | Status: DC

## 2016-07-22 ENCOUNTER — Other Ambulatory Visit: Payer: Self-pay | Admitting: Hematology and Oncology

## 2016-07-23 ENCOUNTER — Inpatient Hospital Stay: Payer: BLUE CROSS/BLUE SHIELD

## 2016-07-23 ENCOUNTER — Inpatient Hospital Stay: Payer: BLUE CROSS/BLUE SHIELD | Attending: Hematology and Oncology

## 2016-07-23 ENCOUNTER — Telehealth: Payer: Self-pay | Admitting: *Deleted

## 2016-07-23 ENCOUNTER — Other Ambulatory Visit: Payer: Self-pay | Admitting: Hematology and Oncology

## 2016-07-23 ENCOUNTER — Other Ambulatory Visit: Payer: Self-pay

## 2016-07-23 VITALS — BP 118/76 | HR 76 | Temp 98.6°F | Resp 20

## 2016-07-23 DIAGNOSIS — Z79899 Other long term (current) drug therapy: Secondary | ICD-10-CM | POA: Insufficient documentation

## 2016-07-23 DIAGNOSIS — E538 Deficiency of other specified B group vitamins: Secondary | ICD-10-CM

## 2016-07-23 DIAGNOSIS — C9000 Multiple myeloma not having achieved remission: Secondary | ICD-10-CM

## 2016-07-23 LAB — CBC WITH DIFFERENTIAL/PLATELET
Basophils Absolute: 0.1 10*3/uL (ref 0–0.1)
Basophils Relative: 2 %
Eosinophils Absolute: 0.3 10*3/uL (ref 0–0.7)
Eosinophils Relative: 8 %
HCT: 36.7 % — ABNORMAL LOW (ref 40.0–52.0)
Hemoglobin: 13.1 g/dL (ref 13.0–18.0)
Lymphocytes Relative: 25 %
Lymphs Abs: 1.1 10*3/uL (ref 1.0–3.6)
MCH: 36.2 pg — ABNORMAL HIGH (ref 26.0–34.0)
MCHC: 35.6 g/dL (ref 32.0–36.0)
MCV: 101.8 fL — ABNORMAL HIGH (ref 80.0–100.0)
Monocytes Absolute: 0.5 10*3/uL (ref 0.2–1.0)
Monocytes Relative: 12 %
Neutro Abs: 2.2 10*3/uL (ref 1.4–6.5)
Neutrophils Relative %: 53 %
Platelets: 188 10*3/uL (ref 150–440)
RBC: 3.61 MIL/uL — ABNORMAL LOW (ref 4.40–5.90)
RDW: 12.9 % (ref 11.5–14.5)
WBC: 4.2 10*3/uL (ref 3.8–10.6)

## 2016-07-23 LAB — COMPREHENSIVE METABOLIC PANEL
ALT: 26 U/L (ref 17–63)
AST: 34 U/L (ref 15–41)
Albumin: 3.8 g/dL (ref 3.5–5.0)
Alkaline Phosphatase: 44 U/L (ref 38–126)
Anion gap: 6 (ref 5–15)
BUN: 17 mg/dL (ref 6–20)
CO2: 24 mmol/L (ref 22–32)
Calcium: 9.3 mg/dL (ref 8.9–10.3)
Chloride: 107 mmol/L (ref 101–111)
Creatinine, Ser: 0.96 mg/dL (ref 0.61–1.24)
GFR calc Af Amer: >60 mL/min (ref >60)
GFR calc non Af Amer: >60 mL/min (ref >60)
Glucose, Bld: 143 mg/dL — ABNORMAL HIGH (ref 65–99)
Potassium: 4.4 mmol/L (ref 3.5–5.1)
Sodium: 137 mmol/L (ref 135–145)
Total Bilirubin: 0.6 mg/dL (ref 0.3–1.2)
Total Protein: 7.7 g/dL (ref 6.5–8.1)

## 2016-07-23 MED ORDER — SODIUM CHLORIDE 0.9 % IV SOLN
Freq: Once | INTRAVENOUS | Status: AC
  Administered 2016-07-23: 14:00:00 via INTRAVENOUS
  Filled 2016-07-23: qty 1000

## 2016-07-23 MED ORDER — ZOLEDRONIC ACID 4 MG/100ML IV SOLN
4.0000 mg | Freq: Once | INTRAVENOUS | Status: AC
  Administered 2016-07-23: 4 mg via INTRAVENOUS
  Filled 2016-07-23: qty 100

## 2016-07-23 MED ORDER — CYANOCOBALAMIN 1000 MCG/ML IJ SOLN
1000.0000 ug | Freq: Once | INTRAMUSCULAR | Status: AC
  Administered 2016-07-23: 1000 ug via INTRAMUSCULAR
  Filled 2016-07-23: qty 1

## 2016-07-23 NOTE — Telephone Encounter (Signed)
Called and spoke to pharmacy and I gave him the auth # [id number]

## 2016-07-23 NOTE — Telephone Encounter (Signed)
Called and spoke to pharmacy and gave staff auth # [id number] for revlimid

## 2016-07-23 NOTE — Telephone Encounter (Signed)
Received prescription for Revlimid, but it has no auth # on it, Please call with Auth number [id number]

## 2016-07-26 ENCOUNTER — Telehealth: Payer: Self-pay | Admitting: *Deleted

## 2016-07-26 MED ORDER — LENALIDOMIDE 10 MG PO CAPS
ORAL_CAPSULE | ORAL | 1 refills | Status: DC
Start: 2016-07-26 — End: 2016-07-26

## 2016-07-26 MED ORDER — LENALIDOMIDE 10 MG PO CAPS: ORAL_CAPSULE | ORAL | 1 refills | Status: DC

## 2016-07-26 NOTE — Addendum Note (Signed)
Addended by: Betti Cruz on: 07/26/2016 02:23 PM   Modules accepted: Orders

## 2016-07-26 NOTE — Telephone Encounter (Signed)
Needs auth number on Rx hard copy,  Resubmitted with auth number

## 2016-08-20 ENCOUNTER — Inpatient Hospital Stay: Payer: BLUE CROSS/BLUE SHIELD | Attending: Hematology and Oncology

## 2016-08-20 ENCOUNTER — Inpatient Hospital Stay: Payer: BLUE CROSS/BLUE SHIELD

## 2016-08-20 ENCOUNTER — Inpatient Hospital Stay (HOSPITAL_BASED_OUTPATIENT_CLINIC_OR_DEPARTMENT_OTHER): Payer: BLUE CROSS/BLUE SHIELD | Admitting: Hematology and Oncology

## 2016-08-20 ENCOUNTER — Other Ambulatory Visit: Payer: Self-pay | Admitting: Hematology and Oncology

## 2016-08-20 ENCOUNTER — Other Ambulatory Visit: Payer: Self-pay | Admitting: *Deleted

## 2016-08-20 VITALS — BP 147/75 | HR 58 | Temp 97.2°F | Resp 18 | Wt 209.4 lb

## 2016-08-20 DIAGNOSIS — K5790 Diverticulosis of intestine, part unspecified, without perforation or abscess without bleeding: Secondary | ICD-10-CM | POA: Diagnosis not present

## 2016-08-20 DIAGNOSIS — I509 Heart failure, unspecified: Secondary | ICD-10-CM

## 2016-08-20 DIAGNOSIS — G4730 Sleep apnea, unspecified: Secondary | ICD-10-CM

## 2016-08-20 DIAGNOSIS — D125 Benign neoplasm of sigmoid colon: Secondary | ICD-10-CM | POA: Diagnosis not present

## 2016-08-20 DIAGNOSIS — M4800 Spinal stenosis, site unspecified: Secondary | ICD-10-CM | POA: Insufficient documentation

## 2016-08-20 DIAGNOSIS — Z79899 Other long term (current) drug therapy: Secondary | ICD-10-CM | POA: Insufficient documentation

## 2016-08-20 DIAGNOSIS — Z7984 Long term (current) use of oral hypoglycemic drugs: Secondary | ICD-10-CM | POA: Diagnosis not present

## 2016-08-20 DIAGNOSIS — F419 Anxiety disorder, unspecified: Secondary | ICD-10-CM | POA: Diagnosis not present

## 2016-08-20 DIAGNOSIS — I209 Angina pectoris, unspecified: Secondary | ICD-10-CM

## 2016-08-20 DIAGNOSIS — Z7982 Long term (current) use of aspirin: Secondary | ICD-10-CM | POA: Insufficient documentation

## 2016-08-20 DIAGNOSIS — M129 Arthropathy, unspecified: Secondary | ICD-10-CM | POA: Insufficient documentation

## 2016-08-20 DIAGNOSIS — F329 Major depressive disorder, single episode, unspecified: Secondary | ICD-10-CM | POA: Insufficient documentation

## 2016-08-20 DIAGNOSIS — C9000 Multiple myeloma not having achieved remission: Secondary | ICD-10-CM

## 2016-08-20 DIAGNOSIS — R0602 Shortness of breath: Secondary | ICD-10-CM

## 2016-08-20 DIAGNOSIS — E119 Type 2 diabetes mellitus without complications: Secondary | ICD-10-CM | POA: Insufficient documentation

## 2016-08-20 DIAGNOSIS — I10 Essential (primary) hypertension: Secondary | ICD-10-CM | POA: Diagnosis not present

## 2016-08-20 DIAGNOSIS — Z9221 Personal history of antineoplastic chemotherapy: Secondary | ICD-10-CM | POA: Diagnosis not present

## 2016-08-20 DIAGNOSIS — I2510 Atherosclerotic heart disease of native coronary artery without angina pectoris: Secondary | ICD-10-CM

## 2016-08-20 DIAGNOSIS — Z87891 Personal history of nicotine dependence: Secondary | ICD-10-CM | POA: Insufficient documentation

## 2016-08-20 DIAGNOSIS — K5730 Diverticulosis of large intestine without perforation or abscess without bleeding: Secondary | ICD-10-CM

## 2016-08-20 DIAGNOSIS — E785 Hyperlipidemia, unspecified: Secondary | ICD-10-CM | POA: Insufficient documentation

## 2016-08-20 DIAGNOSIS — I252 Old myocardial infarction: Secondary | ICD-10-CM

## 2016-08-20 DIAGNOSIS — Z8042 Family history of malignant neoplasm of prostate: Secondary | ICD-10-CM | POA: Insufficient documentation

## 2016-08-20 DIAGNOSIS — E538 Deficiency of other specified B group vitamins: Secondary | ICD-10-CM | POA: Insufficient documentation

## 2016-08-20 LAB — CBC WITH DIFFERENTIAL/PLATELET
Basophils Absolute: 0.1 10*3/uL (ref 0–0.1)
Basophils Relative: 2 %
Eosinophils Absolute: 0.2 10*3/uL (ref 0–0.7)
Eosinophils Relative: 5 %
HCT: 36.7 % — ABNORMAL LOW (ref 40.0–52.0)
Hemoglobin: 12.9 g/dL — ABNORMAL LOW (ref 13.0–18.0)
Lymphocytes Relative: 38 %
Lymphs Abs: 1.5 10*3/uL (ref 1.0–3.6)
MCH: 36.4 pg — ABNORMAL HIGH (ref 26.0–34.0)
MCHC: 35.2 g/dL (ref 32.0–36.0)
MCV: 103.4 fL — ABNORMAL HIGH (ref 80.0–100.0)
Monocytes Absolute: 0.8 10*3/uL (ref 0.2–1.0)
Monocytes Relative: 19 %
Neutro Abs: 1.5 10*3/uL (ref 1.4–6.5)
Neutrophils Relative %: 36 %
Platelets: 162 10*3/uL (ref 150–440)
RBC: 3.55 MIL/uL — ABNORMAL LOW (ref 4.40–5.90)
RDW: 13.7 % (ref 11.5–14.5)
WBC: 4 10*3/uL (ref 3.8–10.6)

## 2016-08-20 LAB — COMPREHENSIVE METABOLIC PANEL
ALT: 27 U/L (ref 17–63)
AST: 38 U/L (ref 15–41)
Albumin: 4 g/dL (ref 3.5–5.0)
Alkaline Phosphatase: 40 U/L (ref 38–126)
Anion gap: 9 (ref 5–15)
BUN: 22 mg/dL — ABNORMAL HIGH (ref 6–20)
CO2: 23 mmol/L (ref 22–32)
Calcium: 9 mg/dL (ref 8.9–10.3)
Chloride: 103 mmol/L (ref 101–111)
Creatinine, Ser: 0.9 mg/dL (ref 0.61–1.24)
GFR calc Af Amer: >60 mL/min (ref >60)
GFR calc non Af Amer: >60 mL/min (ref >60)
Glucose, Bld: 123 mg/dL — ABNORMAL HIGH (ref 65–99)
Potassium: 4 mmol/L (ref 3.5–5.1)
Sodium: 135 mmol/L (ref 135–145)
Total Bilirubin: 0.9 mg/dL (ref 0.3–1.2)
Total Protein: 7.8 g/dL (ref 6.5–8.1)

## 2016-08-20 MED ORDER — CYANOCOBALAMIN 1000 MCG/ML IJ SOLN
1000.0000 ug | Freq: Once | INTRAMUSCULAR | Status: AC
  Administered 2016-08-20: 1000 ug via INTRAMUSCULAR
  Filled 2016-08-20: qty 1

## 2016-08-20 NOTE — Progress Notes (Signed)
[facility] day:  08/20/16   Chief Complaint: Luis [name]. is a 70 y.o. male with multiple myeloma on Revlimid who is seen for 2 month assessment on Revlimid prior to monthly B12.  HPI:  The patient was last seen in the medical oncology clinic on 06/25/2016.  At that time, he was doing well.  Exam was stable.  He had mild hypocalcemia.  Zometa was postponed.  Calcium was increased.  He received Zometa on 07/23/2016.  He has continued to receive monthly B12.  He remains on Revlimid 10 mg every other day.  During the interim, he has done well.  He continues to note "a little bowel problem". He tries to be active. He notes a pain in his hip and leg due to spinal stenosis. He is taking his calcium regularly.  He notes that his wife is trying to retire in December.                                                                                                                                                                                                                                                       Past Medical History:  Diagnosis Date  . Angina pectoris ([place])   . Anxiety   . Arthritis   . CHF (congestive heart failure) ([place])   . Coronary artery disease   . Depression   . Diabetes mellitus without complication [facility])    Patient takes Metformin.  . Diverticulosis   . GERD (gastroesophageal reflux disease)   . Hyperlipidemia   . Hypertension   . Multiple myeloma ([place])   . Multiple myeloma ([place]) 03/27/2015  . Myocardial infarction April 2001   widowmaker  . Shortness of breath dyspnea   . Sleep apnea    No CPAP @ present  . Spinal stenosis     Past Surgical History:  Procedure Laterality Date  . CARDIAC CATHETERIZATION    . CARPAL TUNNEL RELEASE    . CATARACT EXTRACTION    . COLONOSCOPY WITH PROPOFOL N/A 11/11/2015   Procedure: COLONOSCOPY WITH PROPOFOL;  Surgeon: Lollie Sails, MD;  Location: [facility] ENDOSCOPY;  Service:  Endoscopy;  Laterality: N/A;  . CORONARY ARTERY BYPASS GRAFT    . EYE SURGERY Bilateral    Cataract Extraction  . INGUINAL  HERNIA REPAIR    . JOINT REPLACEMENT Right 2008   Right Total Hip Replacement  . PILONIDAL CYST EXCISION    . ROTATOR CUFF REPAIR    . TOTAL HIP ARTHROPLASTY Right   . VENTRAL HERNIA REPAIR N/A 08/15/2015   Procedure: VENTRAL HERNIA REPAIR WITH MESH ;  Surgeon: Leonie Green, MD;  Location: [facility] ORS;  Service: General;  Laterality: N/A;    Family History  Problem Relation Age of Onset  . Heart disease Father   . Stroke Mother   . Prostate cancer Maternal Grandfather 60    Social History:  reports that he quit smoking about 16 years ago. His smoking use included Cigarettes. He has a 60.00 pack-year smoking history. He quit smokeless tobacco use about 16 years ago. He reports that he does not drink alcohol or use drugs.  He notes that his wife goes off to work.  He states that his wife would like to retire, but that would affect his medication coverage.  He has projects that he likes to do at home.  He does wood working.  His 42nd high school graduation anniversary is coming up in 06/2016.  He traveled to [place] for a few days to see his new grand-daughter, [name].  The patient is alone today.  Allergies:  Allergies  Allergen Reactions  . Pravachol [Pravastatin]   . Pravastatin Sodium Other (See Comments)  . Statins Other (See Comments)    Muscle aches  . Zocor [Simvastatin] Other (See Comments)    Muscle aches    Current Medications: Current Outpatient Prescriptions  Medication Sig Dispense Refill  . acetaminophen (TYLENOL) 500 MG tablet Take 500 mg by mouth every 6 (six) hours as needed.    . ALPRAZolam (XANAX) 0.25 MG tablet Take 0.25 mg by mouth at bedtime as needed. for sleep  3  . aspirin 81 MG tablet Take 81 mg by mouth daily.     [name] Pollen 500 MG CHEW Chew 1 tablet by mouth daily.    . Calcium Carb-Cholecalciferol 600-200 MG-UNIT TABS  Take 1 tablet by mouth daily.    . Cyanocobalamin (VITAMIN B-12 IJ) Inject as directed.    . ergocalciferol (VITAMIN D2) 50000 UNITS capsule Take 50,000 Units by mouth once a week.    Marland Kitchen HYDROcodone-acetaminophen (NORCO) 5-325 MG tablet Take 1-2 tablets by mouth every 4 (four) hours as needed for moderate pain. 20 tablet 0  . lisinopril (PRINIVIL,ZESTRIL) 20 MG tablet Take 1 tablet by mouth daily.    Marland Kitchen lovastatin (MEVACOR) 40 MG tablet Take 40 mg by mouth at bedtime.    Marland Kitchen omeprazole (PRILOSEC) 20 MG capsule Take 20 mg by mouth daily.    . potassium chloride SA (KLOR-CON M20) 20 MEQ tablet TAKE 1 TABLET (20 MEQ TOTAL) BY MOUTH DAILY. 30 tablet 2  . REVLIMID 10 MG capsule TAKE 1 CAPSULE (10MG) BY MOUTH EVERY OTHER DAY 14 capsule 0  . metFORMIN (GLUCOPHAGE) 500 MG tablet     . traMADol (ULTRAM) 50 MG tablet Take 1 tablet (50 mg total) by mouth 3 (three) times daily as needed. (Patient not taking: Reported on 08/20/2016) 60 tablet 0   No current facility-administered medications for this visit.     Review of Systems:  GENERAL:  Feels good.  No fevers or sweats.  Weight up 3 pounds. PERFORMANCE STATUS (ECOG): 1 HEENT: No visual changes, runny nose, sore throat, mouth sores or tenderness. Lungs: No shortness of breath or cough. No hemoptysis. Cardiac: No  chest pain, palpitations, orthopnea, or PND.  Pre-op echo and stress test good. GI: Constipation. No nausea, vomiting, diarrhea, melena or hematochezia. GU: No urgency, frequency, dysuria, or hematuria. Musculoskeletal: Spinal stenosis causes legs aches. No joint pain. No muscle tenderness. Extremities: No pain or swelling. Skin: No rashes or skin changes. Neuro: No headache, numbness or weakness, balance or coordination issues. Endocrine: Diabetes.  No thyroid issues, hot flashes or night sweats. Psych: No mood changes, depression or anxiety. Pain: No focal pain. Review of systems: All other systems reviewed and found to be  negative.  Physical Exam: Blood pressure (!) 147/75, pulse (!) 58, temperature 97.2 F (36.2 C), temperature source Tympanic, resp. rate 18, weight 209 lb 7 oz (95 kg).  GENERAL: Well developed, well nourished, elderly gentleman sitting comfortably in the exam room in no acute distress. MENTAL STATUS: Alert and oriented to person, place and time. HEAD: Short white hair. Male pattern baldness. Normocephalic, atraumatic, face symmetric, no Cushingoid features. EYES: Blue eyes. Pupils equal round and reactive to light and accomodation. No conjunctivitis or scleral icterus. ENT: Oropharynx clear without lesion. Tongue normal. Mucous membranes moist.  RESPIRATORY: Clear to auscultation without rales, wheezes or rhonchi. CARDIOVASCULAR: Regular rate and rhythm without murmur, rub or gallop. ABDOMEN: Soft, non-tender, with active bowel sounds, and no hepatosplenomegaly. No masses. SKIN: No rashes, ulcers or lesions. EXTREMITIES: No edema, no skin discoloration or tenderness. No palpable cords. LYMPH NODES: No palpable cervical, supraclavicular, axillary or inguinal adenopathy  NEUROLOGICAL: Unremarkable. PSYCH: Appropriate   Appointment on 08/20/2016  Component Date Value Ref Range Status  . WBC 08/20/2016 4.0  3.8 - 10.6 K/uL Final  . RBC 08/20/2016 3.55* 4.40 - 5.90 MIL/uL Final  . Hemoglobin 08/20/2016 12.9* 13.0 - 18.0 g/dL Final  . HCT 08/20/2016 36.7* 40.0 - 52.0 % Final  . MCV 08/20/2016 103.4* 80.0 - 100.0 fL Final  . MCH 08/20/2016 36.4* 26.0 - 34.0 pg Final  . MCHC 08/20/2016 35.2  32.0 - 36.0 g/dL Final  . RDW 08/20/2016 13.7  11.5 - 14.5 % Final  . Platelets 08/20/2016 162  150 - 440 K/uL Final  . Neutrophils Relative % 08/20/2016 36  % Final  . Neutro Abs 08/20/2016 1.5  1.4 - 6.5 K/uL Final  . Lymphocytes Relative 08/20/2016 38  % Final  . Lymphs Abs 08/20/2016 1.5  1.0 - 3.6 K/uL Final  . Monocytes Relative 08/20/2016 19  % Final  . Monocytes Absolute  08/20/2016 0.8  0.2 - 1.0 K/uL Final  . Eosinophils Relative 08/20/2016 5  % Final  . Eosinophils Absolute 08/20/2016 0.2  0 - 0.7 K/uL Final  . Basophils Relative 08/20/2016 2  % Final  . Basophils Absolute 08/20/2016 0.1  0 - 0.1 K/uL Final  . Sodium 08/20/2016 135  135 - 145 mmol/L Final  . Potassium 08/20/2016 4.0  3.5 - 5.1 mmol/L Final  . Chloride 08/20/2016 103  101 - 111 mmol/L Final  . CO2 08/20/2016 23  22 - 32 mmol/L Final  . Glucose, Bld 08/20/2016 123* 65 - 99 mg/dL Final  . BUN 08/20/2016 22* 6 - 20 mg/dL Final  . Creatinine, Ser 08/20/2016 0.90  0.61 - 1.24 mg/dL Final  . Calcium 08/20/2016 9.0  8.9 - 10.3 mg/dL Final  . Total Protein 08/20/2016 7.8  6.5 - 8.1 g/dL Final  . Albumin 08/20/2016 4.0  3.5 - 5.0 g/dL Final  . AST 08/20/2016 38  15 - 41 U/L Final  . ALT 08/20/2016 27  17 -  63 U/L Final  . Alkaline Phosphatase 08/20/2016 40  38 - 126 U/L Final  . Total Bilirubin 08/20/2016 0.9  0.3 - 1.2 mg/dL Final  . GFR calc non Af Amer 08/20/2016 >60  >60 mL/min Final  . GFR calc Af Amer 08/20/2016 >60  >60 mL/min Final   Comment: (NOTE) The eGFR has been calculated using the CKD EPI equation. This calculation has not been validated in all clinical situations. eGFR's persistently <60 mL/min signify possible Chronic Kidney Disease.   Georgiann Hahn gap 08/20/2016 9  5 - 15 Final    Assessment:  Luis [name]. is a 70 y.o. male with stage III multiple myeloma. He presented in 02/2013 with left-sided sharp pain. Evaluation revealed wide-spread lytic lesions including fracture of the left 5th rib laterally. CT scans on 02/22/2013 showed innumerable lytic lesions in thoracic spine, sternum, clavicle, scapula, and ribs.   Bone marrow biopsy on 03/13/2013 revealed 15 to 20% plasma cells. Iron stores were absent. Renal function was normal. SPEP on 03/01/2013 revealed a 1.4 gm/dL IgG monoclonal lambda. IgG was 1987.   He began induction with Revlimid 25 mg a day (3 weeks on  and 1 week off) and Decadron (40 mg once a week) on 04/09/2013. SPEP has revealed no monoclonal protein since 07/26/2014 (last check 05/28/2016). IgG was 1362 on 07/26/2014 and 1097 on 12/19/2014. Light chains have been monitored: lambda free light chains 33.20 (ratio of 1.56) on 09/20/2014, 31.51 (ratio of 1.43) on 12/19/2014, 30.25 (ratio of 1.56) on 05/01/2015, 31.95 (ratio 1.49) on 01/01/2016, 28.02 (ratio 1.29) on 03/25/2016, 31.5 (ratio 1.26) on 05/28/2016.  24 hour UPEP on 12/02/2015 revealed no monoclonal protein.  With remission, Revlimid was decreased to 10 mg a day then to 10 mg every other day secondary to issues with renal function and diarrhea.   Bone survey on 05/30/2015 revealed the majority of small lytic lesions were stable.  There was possibly new lesion in the distal left clavicle and right mid femur (small) and a possible developing lucency in the proximal left femoral shaft.  The calvarial lesion noted on the prior study was not well seen (? positional).  Bone survey on 11/27/2015 revealed widespread bony lytic lesions consistent with multiple myeloma.  The vast majority of lesions were stable.  A few small lesions were not previously seen.  One lesion was previously obscured by bowel contents.  Two small lesions in the right distal femoral diaphysis appeared new.  Bone survey on 05/25/2016 revealed no evidence of progressive myelomatous involvement of the skeleton.  He receives Zometa every 3 months (last 07/23/2016). He takes calcium 750 mg BID. He receives B12 monthly (last 07/23/2016). B12 was 370 on 01/17/2015.  Abdominal and pelvic CT scan on 07/15/2015 revealed  extensive sigmoid diverticulosis. There was questionable wall thickening of the ascending colon versus artifact from incomplete distention.  Colonoscopy on 11/11/2015 revealed diverticulosis in the sigmoid colon and distal descending colon and ascending colon.  There was a 2 mm polyp in the mid sigmoid colon.   Pathology revealed a hyperplastic polyp, negative for dysplasia or malignancy.  Symptomatically, he is doing well.  Exam is stable.  Calcium is normal.  He has a stable macrocytic anemia.  Plan: 1.  Labs today:  CBC with diff, CMP, SPEP, free light chains.. 2.  B12 today and monthly. 3.  Continue Revlimid 10 mg QOD. 4.  RTC in 1 month for labs (CBC with diff, CMP, folate). 5.  RTC in 2 months for  MD assessment, labs (CBC with diff, CMP, SPEP, free light chains), Zometa and B12.   Lequita Asal, MD  08/20/2016, 11:25 AM

## 2016-08-21 ENCOUNTER — Encounter: Payer: Self-pay | Admitting: Hematology and Oncology

## 2016-08-23 LAB — PROTEIN ELECTROPHORESIS, SERUM
A/G Ratio: 1.1 (ref 0.7–1.7)
Albumin ELP: 3.7 g/dL (ref 2.9–4.4)
Alpha-1-Globulin: 0.2 g/dL (ref 0.0–0.4)
Alpha-2-Globulin: 0.8 g/dL (ref 0.4–1.0)
Beta Globulin: 1.3 g/dL (ref 0.7–1.3)
Gamma Globulin: 1.1 g/dL (ref 0.4–1.8)
Globulin, Total: 3.5 g/dL (ref 2.2–3.9)
Total Protein ELP: 7.2 g/dL (ref 6.0–8.5)

## 2016-08-23 LAB — KAPPA/LAMBDA LIGHT CHAINS
Kappa free light chain: 29.4 mg/L — ABNORMAL HIGH (ref 3.3–19.4)
Kappa, lambda light chain ratio: 1.35 (ref 0.26–1.65)
Lambda free light chains: 21.8 mg/L (ref 5.7–26.3)

## 2016-08-24 ENCOUNTER — Other Ambulatory Visit: Payer: Self-pay | Admitting: Hematology and Oncology

## 2016-08-29 ENCOUNTER — Other Ambulatory Visit: Payer: Self-pay | Admitting: Hematology and Oncology

## 2016-09-17 ENCOUNTER — Inpatient Hospital Stay: Payer: BLUE CROSS/BLUE SHIELD | Attending: Hematology and Oncology

## 2016-09-17 ENCOUNTER — Inpatient Hospital Stay: Payer: BLUE CROSS/BLUE SHIELD

## 2016-09-17 ENCOUNTER — Telehealth: Payer: Self-pay | Admitting: *Deleted

## 2016-09-17 DIAGNOSIS — Z79899 Other long term (current) drug therapy: Secondary | ICD-10-CM | POA: Diagnosis not present

## 2016-09-17 DIAGNOSIS — Z87891 Personal history of nicotine dependence: Secondary | ICD-10-CM | POA: Insufficient documentation

## 2016-09-17 DIAGNOSIS — F329 Major depressive disorder, single episode, unspecified: Secondary | ICD-10-CM | POA: Insufficient documentation

## 2016-09-17 DIAGNOSIS — K219 Gastro-esophageal reflux disease without esophagitis: Secondary | ICD-10-CM | POA: Insufficient documentation

## 2016-09-17 DIAGNOSIS — I10 Essential (primary) hypertension: Secondary | ICD-10-CM | POA: Diagnosis not present

## 2016-09-17 DIAGNOSIS — Z8041 Family history of malignant neoplasm of ovary: Secondary | ICD-10-CM | POA: Insufficient documentation

## 2016-09-17 DIAGNOSIS — K5790 Diverticulosis of intestine, part unspecified, without perforation or abscess without bleeding: Secondary | ICD-10-CM | POA: Diagnosis not present

## 2016-09-17 DIAGNOSIS — D125 Benign neoplasm of sigmoid colon: Secondary | ICD-10-CM | POA: Diagnosis not present

## 2016-09-17 DIAGNOSIS — C9000 Multiple myeloma not having achieved remission: Secondary | ICD-10-CM | POA: Diagnosis present

## 2016-09-17 DIAGNOSIS — E538 Deficiency of other specified B group vitamins: Secondary | ICD-10-CM | POA: Diagnosis not present

## 2016-09-17 DIAGNOSIS — Z7984 Long term (current) use of oral hypoglycemic drugs: Secondary | ICD-10-CM | POA: Diagnosis not present

## 2016-09-17 DIAGNOSIS — M129 Arthropathy, unspecified: Secondary | ICD-10-CM | POA: Diagnosis not present

## 2016-09-17 DIAGNOSIS — R0602 Shortness of breath: Secondary | ICD-10-CM | POA: Diagnosis not present

## 2016-09-17 DIAGNOSIS — I2510 Atherosclerotic heart disease of native coronary artery without angina pectoris: Secondary | ICD-10-CM | POA: Insufficient documentation

## 2016-09-17 DIAGNOSIS — E1165 Type 2 diabetes mellitus with hyperglycemia: Secondary | ICD-10-CM | POA: Diagnosis not present

## 2016-09-17 DIAGNOSIS — F419 Anxiety disorder, unspecified: Secondary | ICD-10-CM | POA: Diagnosis not present

## 2016-09-17 DIAGNOSIS — D539 Nutritional anemia, unspecified: Secondary | ICD-10-CM | POA: Diagnosis not present

## 2016-09-17 DIAGNOSIS — I25119 Atherosclerotic heart disease of native coronary artery with unspecified angina pectoris: Secondary | ICD-10-CM | POA: Diagnosis not present

## 2016-09-17 DIAGNOSIS — M4800 Spinal stenosis, site unspecified: Secondary | ICD-10-CM | POA: Insufficient documentation

## 2016-09-17 DIAGNOSIS — I509 Heart failure, unspecified: Secondary | ICD-10-CM | POA: Diagnosis not present

## 2016-09-17 DIAGNOSIS — G4730 Sleep apnea, unspecified: Secondary | ICD-10-CM | POA: Diagnosis not present

## 2016-09-17 DIAGNOSIS — E785 Hyperlipidemia, unspecified: Secondary | ICD-10-CM | POA: Diagnosis not present

## 2016-09-17 DIAGNOSIS — Z7982 Long term (current) use of aspirin: Secondary | ICD-10-CM | POA: Diagnosis not present

## 2016-09-17 DIAGNOSIS — I252 Old myocardial infarction: Secondary | ICD-10-CM | POA: Insufficient documentation

## 2016-09-17 LAB — CBC WITH DIFFERENTIAL/PLATELET
Basophils Absolute: 0.1 10*3/uL (ref 0–0.1)
Basophils Relative: 3 %
Eosinophils Absolute: 0.3 10*3/uL (ref 0–0.7)
Eosinophils Relative: 11 %
HCT: 34.7 % — ABNORMAL LOW (ref 40.0–52.0)
Hemoglobin: 12.2 g/dL — ABNORMAL LOW (ref 13.0–18.0)
Lymphocytes Relative: 41 %
Lymphs Abs: 1.1 10*3/uL (ref 1.0–3.6)
MCH: 36.6 pg — ABNORMAL HIGH (ref 26.0–34.0)
MCHC: 35.2 g/dL (ref 32.0–36.0)
MCV: 103.8 fL — ABNORMAL HIGH (ref 80.0–100.0)
Monocytes Absolute: 0.4 10*3/uL (ref 0.2–1.0)
Monocytes Relative: 15 %
Neutro Abs: 0.8 10*3/uL — ABNORMAL LOW (ref 1.4–6.5)
Neutrophils Relative %: 30 %
Platelets: 152 10*3/uL (ref 150–440)
RBC: 3.34 MIL/uL — ABNORMAL LOW (ref 4.40–5.90)
RDW: 13.6 % (ref 11.5–14.5)
WBC: 2.8 10*3/uL — ABNORMAL LOW (ref 3.8–10.6)

## 2016-09-17 LAB — COMPREHENSIVE METABOLIC PANEL
ALT: 29 U/L (ref 17–63)
AST: 32 U/L (ref 15–41)
Albumin: 4.1 g/dL (ref 3.5–5.0)
Alkaline Phosphatase: 43 U/L (ref 38–126)
Anion gap: 7 (ref 5–15)
BUN: 15 mg/dL (ref 6–20)
CO2: 27 mmol/L (ref 22–32)
Calcium: 9.7 mg/dL (ref 8.9–10.3)
Chloride: 105 mmol/L (ref 101–111)
Creatinine, Ser: 0.91 mg/dL (ref 0.61–1.24)
GFR calc Af Amer: >60 mL/min (ref >60)
GFR calc non Af Amer: >60 mL/min (ref >60)
Glucose, Bld: 118 mg/dL — ABNORMAL HIGH (ref 65–99)
Potassium: 4.8 mmol/L (ref 3.5–5.1)
Sodium: 139 mmol/L (ref 135–145)
Total Bilirubin: 0.5 mg/dL (ref 0.3–1.2)
Total Protein: 7.7 g/dL (ref 6.5–8.1)

## 2016-09-17 LAB — FOLATE: Folate: 17.5 ng/mL (ref >5.9)

## 2016-09-17 MED ORDER — CYANOCOBALAMIN 1000 MCG/ML IJ SOLN
1000.0000 ug | Freq: Once | INTRAMUSCULAR | Status: AC
  Administered 2016-09-17: 1000 ug via INTRAMUSCULAR
  Filled 2016-09-17: qty 1

## 2016-09-17 NOTE — Telephone Encounter (Signed)
-----   Message from Lequita Asal, MD sent at 09/17/2016 12:56 PM EST ----- Regarding: Please call patient  Neutropenic precautions ([place] [address]).  He is to stop Revlimid.  M   ----- Message ----- From: Interface, Lab In [place] Sent: 09/17/2016  10:12 AM To: Lequita Asal, MD

## 2016-09-17 NOTE — Telephone Encounter (Signed)
Called patient and LVM that he is to stop his Revlimid.  Patient should also follow neutropenic precautions which include: strict handwashing, staying away from anyone who might be sick, no buffet bars or shellfish, and no cut flowers.  All vegetables and fruits should be peeled and cooked.  Nothing raw.  Advised to call back if he has any questions.

## 2016-09-20 ENCOUNTER — Telehealth: Payer: Self-pay | Admitting: *Deleted

## 2016-09-20 ENCOUNTER — Other Ambulatory Visit: Payer: Self-pay | Admitting: Hematology and Oncology

## 2016-09-20 NOTE — Telephone Encounter (Signed)
Patient would like a call back regarding Revlimid and shellfish restriction.

## 2016-09-20 NOTE — Telephone Encounter (Signed)
Called patient back to reinterate that he is to stop his revlilmid.  Also spoke to him regarding shell fish harboring bacteria and that he should avoid it at this time until his counts come up.  Patient verbalized understanding.

## 2016-09-21 ENCOUNTER — Telehealth: Payer: Self-pay | Admitting: *Deleted

## 2016-09-21 DIAGNOSIS — C9000 Multiple myeloma not having achieved remission: Secondary | ICD-10-CM

## 2016-09-21 NOTE — Telephone Encounter (Signed)
I called pt to let him know that I rejected electronic refill of revlimid due to neutropenia.  We need to see pt back in 2 weeks since it is thanksgiving holiday we will push it forward to 11/27 and he is agreeable to the appt 2:45 for labs and see md 3 pm.

## 2016-09-28 ENCOUNTER — Other Ambulatory Visit: Payer: Self-pay | Admitting: *Deleted

## 2016-09-28 DIAGNOSIS — C9000 Multiple myeloma not having achieved remission: Secondary | ICD-10-CM

## 2016-10-04 ENCOUNTER — Inpatient Hospital Stay: Payer: BLUE CROSS/BLUE SHIELD

## 2016-10-04 ENCOUNTER — Inpatient Hospital Stay (HOSPITAL_BASED_OUTPATIENT_CLINIC_OR_DEPARTMENT_OTHER): Payer: BLUE CROSS/BLUE SHIELD | Admitting: Hematology and Oncology

## 2016-10-04 ENCOUNTER — Encounter: Payer: Self-pay | Admitting: Hematology and Oncology

## 2016-10-04 VITALS — BP 129/78 | HR 62 | Temp 96.9°F | Resp 18 | Wt 213.2 lb

## 2016-10-04 DIAGNOSIS — Z7984 Long term (current) use of oral hypoglycemic drugs: Secondary | ICD-10-CM

## 2016-10-04 DIAGNOSIS — F419 Anxiety disorder, unspecified: Secondary | ICD-10-CM

## 2016-10-04 DIAGNOSIS — E1165 Type 2 diabetes mellitus with hyperglycemia: Secondary | ICD-10-CM

## 2016-10-04 DIAGNOSIS — M129 Arthropathy, unspecified: Secondary | ICD-10-CM

## 2016-10-04 DIAGNOSIS — R0602 Shortness of breath: Secondary | ICD-10-CM

## 2016-10-04 DIAGNOSIS — E538 Deficiency of other specified B group vitamins: Secondary | ICD-10-CM

## 2016-10-04 DIAGNOSIS — Z87891 Personal history of nicotine dependence: Secondary | ICD-10-CM

## 2016-10-04 DIAGNOSIS — I252 Old myocardial infarction: Secondary | ICD-10-CM

## 2016-10-04 DIAGNOSIS — F329 Major depressive disorder, single episode, unspecified: Secondary | ICD-10-CM

## 2016-10-04 DIAGNOSIS — I2510 Atherosclerotic heart disease of native coronary artery without angina pectoris: Secondary | ICD-10-CM

## 2016-10-04 DIAGNOSIS — G4730 Sleep apnea, unspecified: Secondary | ICD-10-CM

## 2016-10-04 DIAGNOSIS — K219 Gastro-esophageal reflux disease without esophagitis: Secondary | ICD-10-CM

## 2016-10-04 DIAGNOSIS — I25119 Atherosclerotic heart disease of native coronary artery with unspecified angina pectoris: Secondary | ICD-10-CM

## 2016-10-04 DIAGNOSIS — I509 Heart failure, unspecified: Secondary | ICD-10-CM

## 2016-10-04 DIAGNOSIS — M4800 Spinal stenosis, site unspecified: Secondary | ICD-10-CM

## 2016-10-04 DIAGNOSIS — C9000 Multiple myeloma not having achieved remission: Secondary | ICD-10-CM | POA: Diagnosis not present

## 2016-10-04 DIAGNOSIS — K5790 Diverticulosis of intestine, part unspecified, without perforation or abscess without bleeding: Secondary | ICD-10-CM

## 2016-10-04 DIAGNOSIS — E785 Hyperlipidemia, unspecified: Secondary | ICD-10-CM

## 2016-10-04 DIAGNOSIS — D539 Nutritional anemia, unspecified: Secondary | ICD-10-CM

## 2016-10-04 DIAGNOSIS — Z79899 Other long term (current) drug therapy: Secondary | ICD-10-CM | POA: Diagnosis not present

## 2016-10-04 DIAGNOSIS — Z8041 Family history of malignant neoplasm of ovary: Secondary | ICD-10-CM

## 2016-10-04 DIAGNOSIS — D125 Benign neoplasm of sigmoid colon: Secondary | ICD-10-CM

## 2016-10-04 DIAGNOSIS — I10 Essential (primary) hypertension: Secondary | ICD-10-CM

## 2016-10-04 DIAGNOSIS — Z7982 Long term (current) use of aspirin: Secondary | ICD-10-CM

## 2016-10-04 LAB — CBC WITH DIFFERENTIAL/PLATELET
Basophils Absolute: 0.1 10*3/uL (ref 0–0.1)
Basophils Relative: 2 %
Eosinophils Absolute: 0.2 10*3/uL (ref 0–0.7)
Eosinophils Relative: 4 %
HCT: 35.9 % — ABNORMAL LOW (ref 40.0–52.0)
Hemoglobin: 12.7 g/dL — ABNORMAL LOW (ref 13.0–18.0)
Lymphocytes Relative: 30 %
Lymphs Abs: 1.1 10*3/uL (ref 1.0–3.6)
MCH: 36.7 pg — ABNORMAL HIGH (ref 26.0–34.0)
MCHC: 35.3 g/dL (ref 32.0–36.0)
MCV: 104 fL — ABNORMAL HIGH (ref 80.0–100.0)
Monocytes Absolute: 0.5 10*3/uL (ref 0.2–1.0)
Monocytes Relative: 13 %
Neutro Abs: 1.9 10*3/uL (ref 1.4–6.5)
Neutrophils Relative %: 51 %
Platelets: 170 10*3/uL (ref 150–440)
RBC: 3.45 MIL/uL — ABNORMAL LOW (ref 4.40–5.90)
RDW: 13.3 % (ref 11.5–14.5)
WBC: 3.6 10*3/uL — ABNORMAL LOW (ref 3.8–10.6)

## 2016-10-04 LAB — COMPREHENSIVE METABOLIC PANEL
ALT: 24 U/L (ref 17–63)
AST: 28 U/L (ref 15–41)
Albumin: 4.2 g/dL (ref 3.5–5.0)
Alkaline Phosphatase: 38 U/L (ref 38–126)
Anion gap: 9 (ref 5–15)
BUN: 27 mg/dL — ABNORMAL HIGH (ref 6–20)
CO2: 21 mmol/L — ABNORMAL LOW (ref 22–32)
Calcium: 9.1 mg/dL (ref 8.9–10.3)
Chloride: 105 mmol/L (ref 101–111)
Creatinine, Ser: 1.23 mg/dL (ref 0.61–1.24)
GFR calc Af Amer: >60 mL/min (ref >60)
GFR calc non Af Amer: 57 mL/min — ABNORMAL LOW (ref >60)
Glucose, Bld: 142 mg/dL — ABNORMAL HIGH (ref 65–99)
Potassium: 4.5 mmol/L (ref 3.5–5.1)
Sodium: 135 mmol/L (ref 135–145)
Total Bilirubin: 0.6 mg/dL (ref 0.3–1.2)
Total Protein: 7.8 g/dL (ref 6.5–8.1)

## 2016-10-04 NOTE — Progress Notes (Addendum)
[facility] day:  10/04/16   Chief Complaint: Luis Congrove. is a 70 y.o. male with multiple myeloma on Revlimid who is seen for 1 month assessment.  HPI:  The patient was last seen in the medical oncology clinic on 08/20/2016.  At that time, he was doing well.  Exam was stable.  Calcium was normal.  He had a stable macrocytic anemia.  Labs included a hematocrit 36.7, hemoglobin 12.9, MCV 103.4, platelets 162,000, white count 4000 with an ANC of 1500.  Comprehensive metabolic panel included a creatinine of 0.9, calcium 9.0, protein 7.8 and albumin 4.0.  SPEP revealed no monoclonal protein.  Kappa free light chains were 29.4, lambda free light chains 21.8 and a ratio of 1.35 (normal).  He received B12 on 08/20/2016 and 09/17/2016.  He continued his Revlimid 10 mg QOD.   Labs on 09/17/2016 revealed a hematocrit 34.7, hemoglobin 12.2, MCV 103.8, platelets 152,000, white count 2800 with an ANC of 800.  CMP was normal.   Folate was 17.5.   He was contacted regarding neutropenic precautions.  Revlimid was held.  During the interim, he notes fatigue all of the time.  He denies any new medications or herbal products.  He has kept to his routine.                                                                                                                                                                                                                                                      Past Medical History:  Diagnosis Date  . Angina pectoris ([place])   . Anxiety   . Arthritis   . CHF (congestive heart failure) ([place])   . Coronary artery disease   . Depression   . Diabetes mellitus without complication [facility])    Patient takes Metformin.  . Diverticulosis   . GERD (gastroesophageal reflux disease)   . Hyperlipidemia   . Hypertension   . Multiple myeloma ([place])   . Multiple myeloma ([place]) 03/27/2015  . Myocardial infarction April 2001   widowmaker  .  Shortness of breath dyspnea   . Sleep apnea    No CPAP @ present  . Spinal stenosis     Past Surgical History:  Procedure Laterality Date  . CARDIAC CATHETERIZATION    .  CARPAL TUNNEL RELEASE    . CATARACT EXTRACTION    . COLONOSCOPY WITH PROPOFOL N/A 11/11/2015   Procedure: COLONOSCOPY WITH PROPOFOL;  Surgeon: Lollie Sails, MD;  Location: [facility] ENDOSCOPY;  Service: Endoscopy;  Laterality: N/A;  . CORONARY ARTERY BYPASS GRAFT    . EYE SURGERY Bilateral    Cataract Extraction  . INGUINAL HERNIA REPAIR    . JOINT REPLACEMENT Right 2008   Right Total Hip Replacement  . PILONIDAL CYST EXCISION    . ROTATOR CUFF REPAIR    . TOTAL HIP ARTHROPLASTY Right   . VENTRAL HERNIA REPAIR N/A 08/15/2015   Procedure: VENTRAL HERNIA REPAIR WITH MESH ;  Surgeon: Leonie Green, MD;  Location: [facility] ORS;  Service: General;  Laterality: N/A;    Family History  Problem Relation Age of Onset  . Heart disease Father   . Stroke Mother   . Prostate cancer Maternal Grandfather 5    Social History:  reports that he quit smoking about 16 years ago. His smoking use included Cigarettes. He has a 60.00 pack-year smoking history. He quit smokeless tobacco use about 16 years ago. He reports that he does not drink alcohol or use drugs.  He notes that his wife goes off to work.  He states that his wife would like to retire, but that would affect his medication coverage.  He has projects that he likes to do at home.  He does wood working.  His 42nd high school graduation anniversary was in 06/2016.  He traveled to [place] to see his new grand-daughter, [name].  He does wood turning on Wednesdays.  The patient is alone today.  Allergies:  Allergies  Allergen Reactions  . Pravachol [Pravastatin]   . Pravastatin Sodium Other (See Comments)  . Statins Other (See Comments)    Muscle aches  . Zocor [Simvastatin] Other (See Comments)    Muscle aches    Current Medications: Current Outpatient  Prescriptions  Medication Sig Dispense Refill  . acetaminophen (TYLENOL) 500 MG tablet Take 500 mg by mouth every 6 (six) hours as needed.    . ALPRAZolam (XANAX) 0.25 MG tablet Take 0.25 mg by mouth at bedtime as needed. for sleep  3  . aspirin 81 MG tablet Take 81 mg by mouth daily.     [name] Pollen 500 MG CHEW Chew 1 tablet by mouth daily.    . Calcium Carb-Cholecalciferol 600-200 MG-UNIT TABS Take 1 tablet by mouth daily.    . Cyanocobalamin (VITAMIN B-12 IJ) Inject as directed.    . ergocalciferol (VITAMIN D2) 50000 UNITS capsule Take 50,000 Units by mouth once a week.    Marland Kitchen HYDROcodone-acetaminophen (NORCO) 5-325 MG tablet Take 1-2 tablets by mouth every 4 (four) hours as needed for moderate pain. 20 tablet 0  . KLOR-CON M20 20 MEQ tablet TAKE 1 TABLET (20 MEQ TOTAL) BY MOUTH DAILY. 30 tablet 0  . KLOR-CON M20 20 MEQ tablet TAKE 1 TABLET (20 MEQ TOTAL) BY MOUTH DAILY. 30 tablet 0  . lisinopril (PRINIVIL,ZESTRIL) 20 MG tablet Take 1 tablet by mouth daily.    Marland Kitchen lovastatin (MEVACOR) 40 MG tablet Take 40 mg by mouth at bedtime.    . metFORMIN (GLUCOPHAGE) 500 MG tablet     . omeprazole (PRILOSEC) 20 MG capsule Take 20 mg by mouth daily.    Marland Kitchen REVLIMID 10 MG capsule TAKE 1 CAPSULE (10MG) BY MOUTH EVERY OTHER DAY 14 capsule 0  . traMADol (ULTRAM) 50 MG tablet Take 1 tablet (  50 mg total) by mouth 3 (three) times daily as needed. (Patient not taking: Reported on 10/04/2016) 60 tablet 0  . Zoledronic Acid (ZOMETA) 4 MG/100ML IVPB Inject into the vein.     No current facility-administered medications for this visit.     Review of Systems:  GENERAL:  Fatigued all of the time.  No fevers or sweats.  Weight up 4 pounds. PERFORMANCE STATUS (ECOG): 1 HEENT: No visual changes, runny nose, sore throat, mouth sores or tenderness. Lungs: Shortness of breath on exertion.  No cough. No hemoptysis. Cardiac: No chest pain, palpitations, orthopnea, or PND.  Pre-op echo and stress test good. GI:  Constipation. No nausea, vomiting, diarrhea, melena or hematochezia. GU: No urgency, frequency, dysuria, or hematuria. Musculoskeletal: Spinal stenosis causes legs aches. No joint pain. No muscle tenderness. Extremities: No pain or swelling. Skin: No rashes or skin changes. Neuro: No headache, numbness or weakness, balance or coordination issues. Endocrine: Diabetes.  No thyroid issues, hot flashes or night sweats. Psych: No mood changes, depression or anxiety. Pain: No focal pain. Review of systems: All other systems reviewed and found to be negative.  Physical Exam: Blood pressure 129/78, pulse 62, temperature (!) 96.9 F (36.1 C), temperature source Tympanic, resp. rate 18, weight 213 lb 3 oz (96.7 kg).  GENERAL: Well developed, well nourished, elderly gentleman sitting comfortably in the exam room in no acute distress. MENTAL STATUS: Alert and oriented to person, place and time. HEAD: Short white hair. Male pattern baldness. Normocephalic, atraumatic, face symmetric, no Cushingoid features. EYES: Blue eyes. Pupils equal round and reactive to light and accomodation. No conjunctivitis or scleral icterus. ENT: Oropharynx clear without lesion. Tongue normal. Mucous membranes moist.  RESPIRATORY: Clear to auscultation without rales, wheezes or rhonchi. CARDIOVASCULAR: Regular rate and rhythm without murmur, rub or gallop. ABDOMEN: Soft, non-tender, with active bowel sounds, and no hepatosplenomegaly. No masses. SKIN: No rashes, ulcers or lesions. EXTREMITIES: No edema, no skin discoloration or tenderness. No palpable cords. LYMPH NODES: No palpable cervical, supraclavicular, axillary or inguinal adenopathy  NEUROLOGICAL: Unremarkable. PSYCH: Appropriate   Appointment on 10/04/2016  Component Date Value Ref Range Status  . WBC 10/04/2016 3.6* 3.8 - 10.6 K/uL Final  . RBC 10/04/2016 3.45* 4.40 - 5.90 MIL/uL Final  . Hemoglobin 10/04/2016 12.7* 13.0 - 18.0  g/dL Final  . HCT 10/04/2016 35.9* 40.0 - 52.0 % Final  . MCV 10/04/2016 104.0* 80.0 - 100.0 fL Final  . MCH 10/04/2016 36.7* 26.0 - 34.0 pg Final  . MCHC 10/04/2016 35.3  32.0 - 36.0 g/dL Final  . RDW 10/04/2016 13.3  11.5 - 14.5 % Final  . Platelets 10/04/2016 170  150 - 440 K/uL Final  . Neutrophils Relative % 10/04/2016 51  % Final  . Neutro Abs 10/04/2016 1.9  1.4 - 6.5 K/uL Final  . Lymphocytes Relative 10/04/2016 30  % Final  . Lymphs Abs 10/04/2016 1.1  1.0 - 3.6 K/uL Final  . Monocytes Relative 10/04/2016 13  % Final  . Monocytes Absolute 10/04/2016 0.5  0.2 - 1.0 K/uL Final  . Eosinophils Relative 10/04/2016 4  % Final  . Eosinophils Absolute 10/04/2016 0.2  0 - 0.7 K/uL Final  . Basophils Relative 10/04/2016 2  % Final  . Basophils Absolute 10/04/2016 0.1  0 - 0.1 K/uL Final  . Sodium 10/04/2016 135  135 - 145 mmol/L Final  . Potassium 10/04/2016 4.5  3.5 - 5.1 mmol/L Final  . Chloride 10/04/2016 105  101 - 111 mmol/L Final  .  CO2 10/04/2016 21* 22 - 32 mmol/L Final  . Glucose, Bld 10/04/2016 142* 65 - 99 mg/dL Final  . BUN 10/04/2016 27* 6 - 20 mg/dL Final  . Creatinine, Ser 10/04/2016 1.23  0.61 - 1.24 mg/dL Final  . Calcium 10/04/2016 9.1  8.9 - 10.3 mg/dL Final  . Total Protein 10/04/2016 7.8  6.5 - 8.1 g/dL Final  . Albumin 10/04/2016 4.2  3.5 - 5.0 g/dL Final  . AST 10/04/2016 28  15 - 41 U/L Final  . ALT 10/04/2016 24  17 - 63 U/L Final  . Alkaline Phosphatase 10/04/2016 38  38 - 126 U/L Final  . Total Bilirubin 10/04/2016 0.6  0.3 - 1.2 mg/dL Final  . GFR calc non Af Amer 10/04/2016 57* >60 mL/min Final  . GFR calc Af Amer 10/04/2016 >60  >60 mL/min Final   Comment: (NOTE) The eGFR has been calculated using the CKD EPI equation. This calculation has not been validated in all clinical situations. eGFR's persistently <60 mL/min signify possible Chronic Kidney Disease.   Georgiann Hahn gap 10/04/2016 9  5 - 15 Final    Assessment:  Luis Brood. is a 70 y.o. male  with stage III multiple myeloma. He presented in 02/2013 with left-sided sharp pain. Evaluation revealed wide-spread lytic lesions including fracture of the left 5th rib laterally. CT scans on 02/22/2013 showed innumerable lytic lesions in thoracic spine, sternum, clavicle, scapula, and ribs.   Bone marrow biopsy on 03/13/2013 revealed 15 to 20% plasma cells. Iron stores were absent. Renal function was normal. SPEP on 03/01/2013 revealed a 1.4 gm/dL IgG monoclonal lambda. IgG was 1987.   He began induction with Revlimid 25 mg a day (3 weeks on and 1 week off) and Decadron (40 mg once a week) on 04/09/2013. SPEP has revealed no monoclonal protein since 07/26/2014 (last check 08/20/2016). IgG was 1362 on 07/26/2014 and 1097 on 12/19/2014. Lambda free light chains have been monitored: 33.20 (ratio of 1.56) on 09/20/2014, 31.51 (ratio of 1.43) on 12/19/2014, 30.25 (ratio of 1.56) on 05/01/2015, 31.95 (ratio 1.49) on 01/01/2016, 28.02 (ratio 1.29) on 03/25/2016, 31.5 (ratio 1.26) on 05/28/2016, and 21.8 (ratio 1.35) on 08/20/2016.  24 hour UPEP on 12/02/2015 revealed no monoclonal protein.  With remission, Revlimid was decreased to 10 mg a day then to 10 mg every other day secondary to issues with renal function and diarrhea. Revlimid was held on 09/17/2016 secondary to neutropenia (ANC 800).  Bone survey on 05/30/2015 revealed the majority of small lytic lesions were stable.  There was possibly new lesion in the distal left clavicle and right mid femur (small) and a possible developing lucency in the proximal left femoral shaft.  The calvarial lesion noted on the prior study was not well seen (? positional).  Bone survey on 11/27/2015 revealed widespread bony lytic lesions consistent with multiple myeloma.  The vast majority of lesions were stable.  A few small lesions were not previously seen.  One lesion was previously obscured by bowel contents.  Two small lesions in the right distal femoral  diaphysis appeared new.  Bone survey on 05/25/2016 revealed no evidence of progressive myelomatous involvement of the skeleton.  He receives Zometa every 3 months (last 07/23/2016). He takes calcium 750 mg BID. He receives B12 monthly (last 09/17/2016). B12 was 370 on 01/17/2015.  Folate was 17.5 on 09/17/2016.  Abdominal and pelvic CT scan on 07/15/2015 revealed  extensive sigmoid diverticulosis. There was questionable wall thickening of the ascending colon versus artifact  from incomplete distention.  Colonoscopy on 11/11/2015 revealed diverticulosis in the sigmoid colon and distal descending colon and ascending colon.  There was a 2 mm polyp in the mid sigmoid colon.  Pathology revealed a hyperplastic polyp, negative for dysplasia or malignancy.  Symptomatically, he is fatigued.  Exam is stable.  Hematocrit is 35.9.  ANC is 1900.  Plan: 1.  Labs today:  CBC with diff, CMP.  2.  Continue B12 monthly (next due 10/15/2016).  3.  Discuss resolution of neutropenia.  Restart Revlimid 10 mg QOD. 4.  RTC on 10/22/2016 for MD assessment, labs (CBC with diff, CMP, SPEP, free light chains), Zometa and B12.   Lequita Asal, MD  10/04/2016, 3:31 PM

## 2016-10-05 ENCOUNTER — Telehealth: Payer: Self-pay | Admitting: *Deleted

## 2016-10-05 NOTE — Telephone Encounter (Signed)
Per Dr Mike Gip he was told to restart Revlimid 10 mg. Patient advised to restart Revlimid and keep his appt as scheduled. He repeated this back to me

## 2016-10-05 NOTE — Telephone Encounter (Signed)
Inquiring if he is to restart his Revlimid. States he never got the answer yesterday. Please advise

## 2016-10-18 ENCOUNTER — Other Ambulatory Visit: Payer: Self-pay | Admitting: *Deleted

## 2016-10-18 MED ORDER — LENALIDOMIDE 10 MG PO CAPS: ORAL_CAPSULE | ORAL | 0 refills | Status: DC

## 2016-10-18 NOTE — Telephone Encounter (Signed)
Called to report that he needs refill on Revlimid, he only has 2 pills left. Also asking what can be done regarding the fact that he either has a terrible cold or her has the flu. Denies fever. Reports sore throat, body aches, sneezing, coughing, runny nose. Please advise

## 2016-10-18 NOTE — Telephone Encounter (Signed)
  OK to hold Revlimid while sick.  If he has a fever, he should call call.  Please have him call us when he is better and restarting his Revlimid.  M

## 2016-10-18 NOTE — Addendum Note (Signed)
Addended by: Betti Cruz on: 10/18/2016 01:44 PM   Modules accepted: Orders

## 2016-10-18 NOTE — Telephone Encounter (Signed)
Advised to use OTC meds for his symptoms and call if fever develops

## 2016-10-22 ENCOUNTER — Inpatient Hospital Stay (HOSPITAL_BASED_OUTPATIENT_CLINIC_OR_DEPARTMENT_OTHER): Payer: BLUE CROSS/BLUE SHIELD | Admitting: Hematology and Oncology

## 2016-10-22 ENCOUNTER — Inpatient Hospital Stay: Payer: BLUE CROSS/BLUE SHIELD | Attending: Hematology and Oncology

## 2016-10-22 ENCOUNTER — Inpatient Hospital Stay: Payer: BLUE CROSS/BLUE SHIELD

## 2016-10-22 VITALS — BP 107/58 | HR 58 | Temp 98.2°F | Resp 18 | Wt 210.8 lb

## 2016-10-22 DIAGNOSIS — R0602 Shortness of breath: Secondary | ICD-10-CM | POA: Diagnosis not present

## 2016-10-22 DIAGNOSIS — F329 Major depressive disorder, single episode, unspecified: Secondary | ICD-10-CM | POA: Diagnosis not present

## 2016-10-22 DIAGNOSIS — I252 Old myocardial infarction: Secondary | ICD-10-CM | POA: Diagnosis not present

## 2016-10-22 DIAGNOSIS — J069 Acute upper respiratory infection, unspecified: Secondary | ICD-10-CM | POA: Insufficient documentation

## 2016-10-22 DIAGNOSIS — Z8781 Personal history of (healed) traumatic fracture: Secondary | ICD-10-CM | POA: Diagnosis not present

## 2016-10-22 DIAGNOSIS — I509 Heart failure, unspecified: Secondary | ICD-10-CM

## 2016-10-22 DIAGNOSIS — E785 Hyperlipidemia, unspecified: Secondary | ICD-10-CM | POA: Diagnosis not present

## 2016-10-22 DIAGNOSIS — Z87891 Personal history of nicotine dependence: Secondary | ICD-10-CM | POA: Diagnosis not present

## 2016-10-22 DIAGNOSIS — Z79899 Other long term (current) drug therapy: Secondary | ICD-10-CM

## 2016-10-22 DIAGNOSIS — F419 Anxiety disorder, unspecified: Secondary | ICD-10-CM

## 2016-10-22 DIAGNOSIS — E538 Deficiency of other specified B group vitamins: Secondary | ICD-10-CM | POA: Diagnosis not present

## 2016-10-22 DIAGNOSIS — Z7984 Long term (current) use of oral hypoglycemic drugs: Secondary | ICD-10-CM | POA: Diagnosis not present

## 2016-10-22 DIAGNOSIS — I10 Essential (primary) hypertension: Secondary | ICD-10-CM

## 2016-10-22 DIAGNOSIS — K219 Gastro-esophageal reflux disease without esophagitis: Secondary | ICD-10-CM | POA: Diagnosis not present

## 2016-10-22 DIAGNOSIS — Z86010 Personal history of colonic polyps: Secondary | ICD-10-CM

## 2016-10-22 DIAGNOSIS — I25119 Atherosclerotic heart disease of native coronary artery with unspecified angina pectoris: Secondary | ICD-10-CM | POA: Diagnosis not present

## 2016-10-22 DIAGNOSIS — C9000 Multiple myeloma not having achieved remission: Secondary | ICD-10-CM

## 2016-10-22 DIAGNOSIS — R5383 Other fatigue: Secondary | ICD-10-CM

## 2016-10-22 DIAGNOSIS — M4800 Spinal stenosis, site unspecified: Secondary | ICD-10-CM | POA: Insufficient documentation

## 2016-10-22 DIAGNOSIS — Z7982 Long term (current) use of aspirin: Secondary | ICD-10-CM | POA: Diagnosis not present

## 2016-10-22 DIAGNOSIS — Z8719 Personal history of other diseases of the digestive system: Secondary | ICD-10-CM | POA: Insufficient documentation

## 2016-10-22 DIAGNOSIS — K5730 Diverticulosis of large intestine without perforation or abscess without bleeding: Secondary | ICD-10-CM

## 2016-10-22 DIAGNOSIS — E119 Type 2 diabetes mellitus without complications: Secondary | ICD-10-CM | POA: Insufficient documentation

## 2016-10-22 LAB — CBC WITH DIFFERENTIAL/PLATELET
Basophils Absolute: 0 10*3/uL (ref 0–0.1)
Basophils Relative: 1 %
Eosinophils Absolute: 0.2 10*3/uL (ref 0–0.7)
Eosinophils Relative: 7 %
HCT: 32.7 % — ABNORMAL LOW (ref 40.0–52.0)
Hemoglobin: 11.3 g/dL — ABNORMAL LOW (ref 13.0–18.0)
Lymphocytes Relative: 27 %
Lymphs Abs: 1 10*3/uL (ref 1.0–3.6)
MCH: 36.1 pg — ABNORMAL HIGH (ref 26.0–34.0)
MCHC: 34.7 g/dL (ref 32.0–36.0)
MCV: 104.1 fL — ABNORMAL HIGH (ref 80.0–100.0)
Monocytes Absolute: 0.7 10*3/uL (ref 0.2–1.0)
Monocytes Relative: 18 %
Neutro Abs: 1.7 10*3/uL (ref 1.4–6.5)
Neutrophils Relative %: 47 %
Platelets: 167 10*3/uL (ref 150–440)
RBC: 3.14 MIL/uL — ABNORMAL LOW (ref 4.40–5.90)
RDW: 13.3 % (ref 11.5–14.5)
WBC: 3.7 10*3/uL — ABNORMAL LOW (ref 3.8–10.6)

## 2016-10-22 LAB — COMPREHENSIVE METABOLIC PANEL
ALT: 18 U/L (ref 17–63)
AST: 26 U/L (ref 15–41)
Albumin: 3.7 g/dL (ref 3.5–5.0)
Alkaline Phosphatase: 49 U/L (ref 38–126)
Anion gap: 7 (ref 5–15)
BUN: 17 mg/dL (ref 6–20)
CO2: 22 mmol/L (ref 22–32)
Calcium: 8 mg/dL — ABNORMAL LOW (ref 8.9–10.3)
Chloride: 107 mmol/L (ref 101–111)
Creatinine, Ser: 0.91 mg/dL (ref 0.61–1.24)
GFR calc Af Amer: >60 mL/min (ref >60)
GFR calc non Af Amer: >60 mL/min (ref >60)
Glucose, Bld: 140 mg/dL — ABNORMAL HIGH (ref 65–99)
Potassium: 4.2 mmol/L (ref 3.5–5.1)
Sodium: 136 mmol/L (ref 135–145)
Total Bilirubin: 0.5 mg/dL (ref 0.3–1.2)
Total Protein: 7.7 g/dL (ref 6.5–8.1)

## 2016-10-22 MED ORDER — CYANOCOBALAMIN 1000 MCG/ML IJ SOLN
1000.0000 ug | Freq: Once | INTRAMUSCULAR | Status: AC
  Administered 2016-10-22: 1000 ug via INTRAMUSCULAR
  Filled 2016-10-22: qty 1

## 2016-10-22 NOTE — Progress Notes (Signed)
Patient states since Sunday he has had cold like symptoms with cough/expectorant (green tinged) , sore throat.  He has not felt well at all and has cancelled several engagements.  Saw his PCP who prescribed Amoxil.  States he is better today. Not sleeping well due to cough.  States he has coughed so hard he felt like his heart was going to stop.

## 2016-10-22 NOTE — Progress Notes (Signed)
[facility] day:  10/22/16   Chief Complaint: Luis Harlin. is a 70 y.o. male with multiple myeloma on Revlimid who is seen for assessment on Revlimid prior to monthly B12.  HPI:  The patient was last seen in the medical oncology clinic on 10/04/2016.  At that time, he felt fatigued.  Hematocrit was stable.  ANC was 1900.  Calcium was normal.  Creatinine was 1.23.    As his neutropenia had resolved, Revlimid was restarted.  He restarted Revlimid on 10/05/2016.  He states that Sunday night, 10/17/2016, he felt achy.  He notes that his ribs are sore from coughing.  He describes a sore throat and congestion.  He was placed on amoxicillin by Dr. Ginette Pitman.  He states that he feels a little better.  He denies any fever.  He has taken his Revlimid.  He missed 1 day.  He may have missed some of his calcium.                                                                                                                                                                                                                                                      Past Medical History:  Diagnosis Date  . Angina pectoris ([place])   . Anxiety   . Arthritis   . CHF (congestive heart failure) ([place])   . Coronary artery disease   . Depression   . Diabetes mellitus without complication [facility])    Patient takes Metformin.  . Diverticulosis   . GERD (gastroesophageal reflux disease)   . Hyperlipidemia   . Hypertension   . Multiple myeloma ([place])   . Multiple myeloma ([place]) 03/27/2015  . Myocardial infarction April 2001   widowmaker  . Shortness of breath dyspnea   . Sleep apnea    No CPAP @ present  . Spinal stenosis     Past Surgical History:  Procedure Laterality Date  . CARDIAC CATHETERIZATION    . CARPAL TUNNEL RELEASE    . CATARACT EXTRACTION    . COLONOSCOPY WITH PROPOFOL N/A 11/11/2015   Procedure: COLONOSCOPY WITH PROPOFOL;  Surgeon: Lollie Sails, MD;   Location: [facility] ENDOSCOPY;  Service: Endoscopy;  Laterality: N/A;  . CORONARY ARTERY BYPASS GRAFT    . EYE SURGERY Bilateral  Cataract Extraction  . INGUINAL HERNIA REPAIR    . JOINT REPLACEMENT Right 2008   Right Total Hip Replacement  . PILONIDAL CYST EXCISION    . ROTATOR CUFF REPAIR    . TOTAL HIP ARTHROPLASTY Right   . VENTRAL HERNIA REPAIR N/A 08/15/2015   Procedure: VENTRAL HERNIA REPAIR WITH MESH ;  Surgeon: Leonie Green, MD;  Location: [facility] ORS;  Service: General;  Laterality: N/A;    Family History  Problem Relation Age of Onset  . Heart disease Father   . Stroke Mother   . Prostate cancer Maternal Grandfather 51    Social History:  reports that he quit smoking about 16 years ago. His smoking use included Cigarettes. He has a 60.00 pack-year smoking history. He quit smokeless tobacco use about 16 years ago. He reports that he does not drink alcohol or use drugs.  He notes that his wife goes off to work.  He states that his wife would like to retire, but that would affect his medication coverage.  He has projects that he likes to do at home.  He does wood working.  His 42nd high school graduation anniversary was in 06/2016.  He has a new grand-daughter, [name].  The patient is alone today.  Allergies:  Allergies  Allergen Reactions  . Pravachol [Pravastatin]   . Pravastatin Sodium Other (See Comments)  . Statins Other (See Comments)    Muscle aches  . Zocor [Simvastatin] Other (See Comments)    Muscle aches    Current Medications: Current Outpatient Prescriptions  Medication Sig Dispense Refill  . acetaminophen (TYLENOL) 500 MG tablet Take 500 mg by mouth every 6 (six) hours as needed.    . ALPRAZolam (XANAX) 0.25 MG tablet Take 0.25 mg by mouth at bedtime as needed. for sleep  3  . aspirin 81 MG tablet Take 81 mg by mouth daily.     [name] Pollen 500 MG CHEW Chew 1 tablet by mouth daily.    . Calcium Carb-Cholecalciferol 600-200 MG-UNIT TABS Take 1 tablet  by mouth daily.    . Cyanocobalamin (VITAMIN B-12 IJ) Inject as directed.    . ergocalciferol (VITAMIN D2) 50000 UNITS capsule Take 50,000 Units by mouth once a week.    Marland Kitchen HYDROcodone-acetaminophen (NORCO) 5-325 MG tablet Take 1-2 tablets by mouth every 4 (four) hours as needed for moderate pain. 20 tablet 0  . KLOR-CON M20 20 MEQ tablet TAKE 1 TABLET (20 MEQ TOTAL) BY MOUTH DAILY. 30 tablet 0  . lenalidomide (REVLIMID) 10 MG capsule TAKE 1 CAPSULE (10MG) BY MOUTH EVERY OTHER DAY 14 capsule 0  . lisinopril (PRINIVIL,ZESTRIL) 20 MG tablet Take 1 tablet by mouth daily.    Marland Kitchen lovastatin (MEVACOR) 40 MG tablet Take 40 mg by mouth at bedtime.    . metFORMIN (GLUCOPHAGE) 500 MG tablet     . omeprazole (PRILOSEC) 20 MG capsule Take 20 mg by mouth daily.    . traMADol (ULTRAM) 50 MG tablet Take 1 tablet (50 mg total) by mouth 3 (three) times daily as needed. 60 tablet 0  . Zoledronic Acid (ZOMETA) 4 MG/100ML IVPB Inject into the vein.    Marland Kitchen amoxicillin (AMOXIL) 875 MG tablet      No current facility-administered medications for this visit.     Review of Systems:  GENERAL:  Feeling better.  No fevers or sweats.  Weight down 3 pounds. PERFORMANCE STATUS (ECOG): 1 HEENT: URI.  Sore throat.  No visual changes, mouth sores or  tenderness. Lungs: Shortness of breath on exertion.  Cough. No hemoptysis. Cardiac: No chest pain, palpitations, orthopnea, or PND.  Pre-op echo and stress test good. GI: Poor appetite. No nausea, vomiting, diarrhea, melena or hematochezia. GU: No urgency, frequency, dysuria, or hematuria. Musculoskeletal: Spinal stenosis causes legs aches. No joint pain. No muscle tenderness. Extremities: No pain or swelling. Skin: No rashes or skin changes. Neuro: No headache, numbness or weakness, balance or coordination issues. Endocrine: Diabetes.  No thyroid issues, hot flashes or night sweats. Psych: Sleeping poorly.  No mood changes, depression or anxiety. Pain: No focal  pain. Review of systems: All other systems reviewed and found to be negative.  Physical Exam: Blood pressure (!) 107/58, pulse (!) 58, temperature 98.2 F (36.8 C), temperature source Tympanic, resp. rate 18, weight 210 lb 12.2 oz (95.6 kg).  GENERAL: Well developed, well nourished, elderly gentleman sitting comfortably in the exam room in no acute distress.  He has a cane at his side. MENTAL STATUS: Alert and oriented to person, place and time. HEAD: Short white hair. Male pattern baldness. Normocephalic, atraumatic, face symmetric, no Cushingoid features. EYES: Blue eyes. Pupils equal round and reactive to light and accomodation. No conjunctivitis or scleral icterus. ENT: Hoarse.  Oropharynx clear without lesion. Tongue normal. Mucous membranes moist.  RESPIRATORY: Clear to auscultation without rales, wheezes or rhonchi. CARDIOVASCULAR: Regular rate and rhythm without murmur, rub or gallop. ABDOMEN: Soft, non-tender, with active bowel sounds, and no hepatosplenomegaly. No masses. SKIN: No rashes, ulcers or lesions. EXTREMITIES: No edema, no skin discoloration or tenderness. No palpable cords. LYMPH NODES: No palpable cervical, supraclavicular, axillary or inguinal adenopathy  NEUROLOGICAL: Unremarkable. PSYCH: Appropriate   Appointment on 10/22/2016  Component Date Value Ref Range Status  . WBC 10/22/2016 3.7* 3.8 - 10.6 K/uL Final  . RBC 10/22/2016 3.14* 4.40 - 5.90 MIL/uL Final  . Hemoglobin 10/22/2016 11.3* 13.0 - 18.0 g/dL Final  . HCT 10/22/2016 32.7* 40.0 - 52.0 % Final  . MCV 10/22/2016 104.1* 80.0 - 100.0 fL Final  . MCH 10/22/2016 36.1* 26.0 - 34.0 pg Final  . MCHC 10/22/2016 34.7  32.0 - 36.0 g/dL Final  . RDW 10/22/2016 13.3  11.5 - 14.5 % Final  . Platelets 10/22/2016 167  150 - 440 K/uL Final  . Neutrophils Relative % 10/22/2016 47  % Final  . Neutro Abs 10/22/2016 1.7  1.4 - 6.5 K/uL Final  . Lymphocytes Relative 10/22/2016 27  % Final  . Lymphs  Abs 10/22/2016 1.0  1.0 - 3.6 K/uL Final  . Monocytes Relative 10/22/2016 18  % Final  . Monocytes Absolute 10/22/2016 0.7  0.2 - 1.0 K/uL Final  . Eosinophils Relative 10/22/2016 7  % Final  . Eosinophils Absolute 10/22/2016 0.2  0 - 0.7 K/uL Final  . Basophils Relative 10/22/2016 1  % Final  . Basophils Absolute 10/22/2016 0.0  0 - 0.1 K/uL Final  . Sodium 10/22/2016 136  135 - 145 mmol/L Final  . Potassium 10/22/2016 4.2  3.5 - 5.1 mmol/L Final  . Chloride 10/22/2016 107  101 - 111 mmol/L Final  . CO2 10/22/2016 22  22 - 32 mmol/L Final  . Glucose, Bld 10/22/2016 140* 65 - 99 mg/dL Final  . BUN 10/22/2016 17  6 - 20 mg/dL Final  . Creatinine, Ser 10/22/2016 0.91  0.61 - 1.24 mg/dL Final  . Calcium 10/22/2016 8.0* 8.9 - 10.3 mg/dL Final  . Total Protein 10/22/2016 7.7  6.5 - 8.1 g/dL Final  . Albumin  10/22/2016 3.7  3.5 - 5.0 g/dL Final  . AST 10/22/2016 26  15 - 41 U/L Final  . ALT 10/22/2016 18  17 - 63 U/L Final  . Alkaline Phosphatase 10/22/2016 49  38 - 126 U/L Final  . Total Bilirubin 10/22/2016 0.5  0.3 - 1.2 mg/dL Final  . GFR calc non Af Amer 10/22/2016 >60  >60 mL/min Final  . GFR calc Af Amer 10/22/2016 >60  >60 mL/min Final   Comment: (NOTE) The eGFR has been calculated using the CKD EPI equation. This calculation has not been validated in all clinical situations. eGFR's persistently <60 mL/min signify possible Chronic Kidney Disease.   Georgiann Hahn gap 10/22/2016 7  5 - 15 Final    Assessment:  Luis Dishman. is a 70 y.o. male with stage III multiple myeloma. He presented in 02/2013 with left-sided sharp pain. Evaluation revealed wide-spread lytic lesions including fracture of the left 5th rib laterally. CT scans on 02/22/2013 showed innumerable lytic lesions in thoracic spine, sternum, clavicle, scapula, and ribs.   Bone marrow biopsy on 03/13/2013 revealed 15 to 20% plasma cells. Iron stores were absent. Renal function was normal. SPEP on 03/01/2013 revealed a  1.4 gm/dL IgG monoclonal lambda. IgG was 1987.   He began induction with Revlimid 25 mg a day (3 weeks on and 1 week off) and Decadron (40 mg once a week) on 04/09/2013. SPEP has revealed no monoclonal protein since 07/26/2014 (last check 10/22/2016). IgG was 1362 on 07/26/2014 and 1097 on 12/19/2014. Lambda free light chains have been monitored: 33.20 (ratio of 1.56) on 09/20/2014, 31.51 (ratio of 1.43) on 12/19/2014, 30.25 (ratio of 1.56) on 05/01/2015, 31.95 (ratio 1.49) on 01/01/2016, 28.02 (ratio 1.29) on 03/25/2016, 31.5 (ratio 1.26) on 05/28/2016, 21.8 (ratio 1.35) on 08/20/2016, and 37.3 (ratio 1.09) on 10/22/2016.  24 hour UPEP on 12/02/2015 revealed no monoclonal protein.  With remission, Revlimid was decreased to 10 mg a day then to 10 mg every other day secondary to issues with renal function and diarrhea.  Revlimid was held on 09/17/2016 secondary to neutropenia (ANC 800) then restarted on 10/05/2016.  Bone survey on 05/30/2015 revealed the majority of small lytic lesions were stable.  There was possibly new lesion in the distal left clavicle and right mid femur (small) and a possible developing lucency in the proximal left femoral shaft.  The calvarial lesion noted on the prior study was not well seen (? positional).  Bone survey on 11/27/2015 revealed widespread bony lytic lesions consistent with multiple myeloma.  The vast majority of lesions were stable.  A few small lesions were not previously seen.  One lesion was previously obscured by bowel contents.  Two small lesions in the right distal femoral diaphysis appeared new.  Bone survey on 05/25/2016 revealed no evidence of progressive myelomatous involvement of the skeleton.  He receives Zometa every 3 months (last 07/23/2016). He takes calcium 750 mg BID. He receives B12 monthly (last 09/17/2016). B12 was 370 on 01/17/2015.  Folate was 17.5 on 09/17/2016.  Abdominal and pelvic CT scan on 07/15/2015 revealed  extensive sigmoid  diverticulosis. There was questionable wall thickening of the ascending colon versus artifact from incomplete distention.  Colonoscopy on 11/11/2015 revealed diverticulosis in the sigmoid colon and distal descending colon and ascending colon.  There was a 2 mm polyp in the mid sigmoid colon.  Pathology revealed a hyperplastic polyp, negative for dysplasia or malignancy.  Symptomatically, he has a URI.  Exam is stable.  He has  hypocalcemia (calcium 8.0) after missed doses of oral calcium.    Plan: 1.  Labs today:  CBC with diff, CMP, SPEP, free light chains.  2.  No Zometa today. 3.  B12 today. 4.  Encourage patient to take oral calcium. 5.  Continue Revlimid 10 mg QOD. 6.  RTC in 1 month for MD assessment, labs (CBC with diff, CMP, SPEP), B12 +/- Zometa.   Lequita Asal, MD  10/22/2016, 11:34 AM

## 2016-10-24 ENCOUNTER — Other Ambulatory Visit: Payer: Self-pay | Admitting: *Deleted

## 2016-10-24 DIAGNOSIS — C9000 Multiple myeloma not having achieved remission: Secondary | ICD-10-CM

## 2016-10-24 DIAGNOSIS — E538 Deficiency of other specified B group vitamins: Secondary | ICD-10-CM

## 2016-10-25 ENCOUNTER — Other Ambulatory Visit: Payer: Self-pay | Admitting: Hematology and Oncology

## 2016-10-25 LAB — KAPPA/LAMBDA LIGHT CHAINS
Kappa free light chain: 37.3 mg/L — ABNORMAL HIGH (ref 3.3–19.4)
Kappa, lambda light chain ratio: 1.09 (ref 0.26–1.65)
Lambda free light chains: 34.1 mg/L — ABNORMAL HIGH (ref 5.7–26.3)

## 2016-10-26 LAB — PROTEIN ELECTROPHORESIS, SERUM
A/G Ratio: 0.9 (ref 0.7–1.7)
Albumin ELP: 3.3 g/dL (ref 2.9–4.4)
Alpha-1-Globulin: 0.3 g/dL (ref 0.0–0.4)
Alpha-2-Globulin: 1.1 g/dL — ABNORMAL HIGH (ref 0.4–1.0)
Beta Globulin: 1.5 g/dL — ABNORMAL HIGH (ref 0.7–1.3)
Gamma Globulin: 0.9 g/dL (ref 0.4–1.8)
Globulin, Total: 3.8 g/dL (ref 2.2–3.9)
Total Protein ELP: 7.1 g/dL (ref 6.0–8.5)

## 2016-10-27 ENCOUNTER — Other Ambulatory Visit: Payer: Self-pay | Admitting: Hematology and Oncology

## 2016-11-09 ENCOUNTER — Other Ambulatory Visit: Payer: Self-pay | Admitting: Hematology and Oncology

## 2016-11-19 ENCOUNTER — Ambulatory Visit: Payer: BLUE CROSS/BLUE SHIELD

## 2016-11-19 ENCOUNTER — Ambulatory Visit: Payer: BLUE CROSS/BLUE SHIELD | Admitting: Hematology and Oncology

## 2016-11-19 ENCOUNTER — Other Ambulatory Visit: Payer: BLUE CROSS/BLUE SHIELD

## 2016-11-21 ENCOUNTER — Encounter: Payer: Self-pay | Admitting: Hematology and Oncology

## 2016-11-22 ENCOUNTER — Inpatient Hospital Stay: Payer: BLUE CROSS/BLUE SHIELD

## 2016-11-22 ENCOUNTER — Inpatient Hospital Stay: Payer: BLUE CROSS/BLUE SHIELD | Admitting: Hematology and Oncology

## 2016-11-25 NOTE — Progress Notes (Addendum)
[facility] day:  11/26/16   Chief Complaint: Luis [name]. is a 71 y.o. male with multiple myeloma on Revlimid who is seen for assessment on Revlimid prior to monthly B12.  HPI:  The patient was last seen in the medical oncology clinic on 10/22/2016.  At that time, he had missed 1 day of Revlimid and possibly some calcium.  Calcium was 8.0.  Zometa was held.  He received B12.  SPEP revealed no monoclonal protein.  During the interim, he notes that he has had the "crud" for 2 weeks.  He has had leg cramps at night.  He is taking his regular calcium chews.  He restarted Crestor.  He has continued his Revlimid QOD without break.                                                                                                                                                                                                                                                       Past Medical History:  Diagnosis Date  . Angina pectoris ([place])   . Anxiety   . Arthritis   . CHF (congestive heart failure) ([place])   . Coronary artery disease   . Depression   . Diabetes mellitus without complication [facility])    Patient takes Metformin.  . Diverticulosis   . GERD (gastroesophageal reflux disease)   . Hyperlipidemia   . Hypertension   . Multiple myeloma ([place])   . Multiple myeloma ([place]) 03/27/2015  . Myocardial infarction April 2001   widowmaker  . Shortness of breath dyspnea   . Sleep apnea    No CPAP @ present  . Spinal stenosis     Past Surgical History:  Procedure Laterality Date  . CARDIAC CATHETERIZATION    . CARPAL TUNNEL RELEASE    . CATARACT EXTRACTION    . COLONOSCOPY WITH PROPOFOL N/A 11/11/2015   Procedure: COLONOSCOPY WITH PROPOFOL;  Surgeon: Lollie Sails, MD;  Location: [facility] ENDOSCOPY;  Service: Endoscopy;  Laterality: N/A;  . CORONARY ARTERY BYPASS GRAFT    . EYE SURGERY Bilateral    Cataract Extraction  . INGUINAL HERNIA REPAIR     . JOINT REPLACEMENT Right 2008   Right Total Hip Replacement  . PILONIDAL CYST EXCISION    . ROTATOR CUFF  REPAIR    . TOTAL HIP ARTHROPLASTY Right   . VENTRAL HERNIA REPAIR N/A 08/15/2015   Procedure: VENTRAL HERNIA REPAIR WITH MESH ;  Surgeon: Leonie Green, MD;  Location: [facility] ORS;  Service: General;  Laterality: N/A;    Family History  Problem Relation Age of Onset  . Heart disease Father   . Stroke Mother   . Prostate cancer Maternal Grandfather 57    Social History:  reports that he quit smoking about 16 years ago. His smoking use included Cigarettes. He has a 60.00 pack-year smoking history. He quit smokeless tobacco use about 16 years ago. He reports that he does not drink alcohol or use drugs.  He notes that his wife goes off to work.  He states that his wife would like to retire, but that would affect his medication coverage.  He has projects that he likes to do at home.  He does wood working.  His 42nd high school graduation anniversary was in 06/2016.  He has a new grand-daughter, [name].  The patient is alone today.  Allergies:  Allergies  Allergen Reactions  . Pravachol [Pravastatin]   . Pravastatin Sodium Other (See Comments)  . Statins Other (See Comments)    Muscle aches  . Zocor [Simvastatin] Other (See Comments)    Muscle aches    Current Medications: Current Outpatient Prescriptions  Medication Sig Dispense Refill  . acetaminophen (TYLENOL) 500 MG tablet Take 500 mg by mouth every 6 (six) hours as needed.    . ALPRAZolam (XANAX) 0.25 MG tablet Take 0.25 mg by mouth at bedtime as needed. for sleep  3  . amoxicillin (AMOXIL) 875 MG tablet     . aspirin 81 MG tablet Take 81 mg by mouth daily.     [name] Pollen 500 MG CHEW Chew 1 tablet by mouth daily.    . Calcium Carbonate-Vitamin D 600-400 MG-UNIT chew tablet Chew 4 tablets by mouth daily. Takes them throughout the day for a total of 4    . Cyanocobalamin (VITAMIN B-12 IJ) Inject 1 Dose as directed  every 30 (thirty) days.     . ergocalciferol (VITAMIN D2) 50000 UNITS capsule Take 50,000 Units by mouth once a week.    Marland Kitchen KLOR-CON M20 20 MEQ tablet TAKE 1 TABLET (20 MEQ TOTAL) BY MOUTH DAILY. 30 tablet 0  . lisinopril (PRINIVIL,ZESTRIL) 20 MG tablet Take 1 tablet by mouth daily.    Marland Kitchen lovastatin (MEVACOR) 40 MG tablet Take 20 mg by mouth at bedtime. 2 tablets a day    . metFORMIN (GLUCOPHAGE) 500 MG tablet 500 mg daily with breakfast.     . omeprazole (PRILOSEC) 20 MG capsule Take 20 mg by mouth daily.    Marland Kitchen REVLIMID 10 MG capsule TAKE 1 CAPSULE (10MG) BY MOUTH EVERY OTHER DAY 14 capsule 1  . Zoledronic Acid (ZOMETA) 4 MG/100ML IVPB Inject into the vein.    Marland Kitchen traMADol (ULTRAM) 50 MG tablet Take 1 tablet (50 mg total) by mouth 3 (three) times daily as needed. (Patient not taking: Reported on 11/26/2016) 60 tablet 0   No current facility-administered medications for this visit.     Review of Systems:  GENERAL:  He has had "the crud" x 2 weeks.  No fevers or sweats.  Weight stable. PERFORMANCE STATUS (ECOG): 1 HEENT: URI/"the crud".  No visual changes, mouth sores or tenderness. Lungs: Shortness of breath on exertion.  Cough. No hemoptysis. Cardiac: No chest pain, palpitations, orthopnea, or PND.  Pre-op  echo and stress test good. GI: No nausea, vomiting, diarrhea, melena or hematochezia. GU: No urgency, frequency, dysuria, or hematuria. Musculoskeletal: Spinal stenosis causes legs aches. No joint pain. No muscle tenderness. Extremities: Leg cramps.  No swelling. Skin: No rashes or skin changes. Neuro: No headache, numbness or weakness, balance or coordination issues. Endocrine: Diabetes.  No thyroid issues, hot flashes or night sweats. Psych: Sleeping poorly.  No mood changes, depression or anxiety. Pain: No focal pain. Review of systems: All other systems reviewed and found to be negative.  Physical Exam: Blood pressure 135/73, pulse 71, temperature 97.4 F (36.3 C),  temperature source Tympanic, resp. rate 18, height _0  (1.702 m), weight 210 lb (95.3 kg).  GENERAL: Well developed, well nourished, elderly gentleman sitting comfortably in the exam room in no acute distress.  He has a cane at his side. MENTAL STATUS: Alert and oriented to person, place and time. HEAD: Short white hair. Male pattern baldness. Normocephalic, atraumatic, face symmetric, no Cushingoid features. EYES: Blue eyes. Pupils equal round and reactive to light and accomodation. No conjunctivitis or scleral icterus. ENT: Hoarse.  Oropharynx clear without lesion. Tongue normal. Mucous membranes moist.  RESPIRATORY: Clear to auscultation without rales, wheezes or rhonchi. CARDIOVASCULAR: Regular rate and rhythm without murmur, rub or gallop. ABDOMEN: Soft, non-tender, with active bowel sounds, and no hepatosplenomegaly. No masses. SKIN: No rashes, ulcers or lesions. EXTREMITIES: No edema, no skin discoloration or tenderness. No palpable cords. LYMPH NODES: No palpable cervical, supraclavicular, axillary or inguinal adenopathy  NEUROLOGICAL: Unremarkable. PSYCH: Appropriate   Appointment on 11/26/2016  Component Date Value Ref Range Status  . WBC 11/26/2016 3.2* 3.8 - 10.6 K/uL Final  . RBC 11/26/2016 3.34* 4.40 - 5.90 MIL/uL Final  . Hemoglobin 11/26/2016 12.3* 13.0 - 18.0 g/dL Final  . HCT 11/26/2016 34.9* 40.0 - 52.0 % Final  . MCV 11/26/2016 104.4* 80.0 - 100.0 fL Final  . MCH 11/26/2016 36.9* 26.0 - 34.0 pg Final  . MCHC 11/26/2016 35.3  32.0 - 36.0 g/dL Final  . RDW 11/26/2016 13.4  11.5 - 14.5 % Final  . Platelets 11/26/2016 169  150 - 440 K/uL Final  . Neutrophils Relative % 11/26/2016 35  % Final  . Neutro Abs 11/26/2016 1.1* 1.4 - 6.5 K/uL Final  . Lymphocytes Relative 11/26/2016 34  % Final  . Lymphs Abs 11/26/2016 1.1  1.0 - 3.6 K/uL Final  . Monocytes Relative 11/26/2016 14  % Final  . Monocytes Absolute 11/26/2016 0.4  0.2 - 1.0 K/uL Final  .  Eosinophils Relative 11/26/2016 14  % Final  . Eosinophils Absolute 11/26/2016 0.4  0 - 0.7 K/uL Final  . Basophils Relative 11/26/2016 3  % Final  . Basophils Absolute 11/26/2016 0.1  0 - 0.1 K/uL Final  . Sodium 11/26/2016 133* 135 - 145 mmol/L Final  . Potassium 11/26/2016 4.7  3.5 - 5.1 mmol/L Final  . Chloride 11/26/2016 104  101 - 111 mmol/L Final  . CO2 11/26/2016 20* 22 - 32 mmol/L Final  . Glucose, Bld 11/26/2016 186* 65 - 99 mg/dL Final  . BUN 11/26/2016 27* 6 - 20 mg/dL Final  . Creatinine, Ser 11/26/2016 1.15  0.61 - 1.24 mg/dL Final  . Calcium 11/26/2016 9.4  8.9 - 10.3 mg/dL Final  . Total Protein 11/26/2016 7.9  6.5 - 8.1 g/dL Final  . Albumin 11/26/2016 4.0  3.5 - 5.0 g/dL Final  . AST 11/26/2016 36  15 - 41 U/L Final  . ALT 11/26/2016 27  17 - 63 U/L Final  . Alkaline Phosphatase 11/26/2016 44  38 - 126 U/L Final  . Total Bilirubin 11/26/2016 0.6  0.3 - 1.2 mg/dL Final  . GFR calc non Af Amer 11/26/2016 >60  >60 mL/min Final  . GFR calc Af Amer 11/26/2016 >60  >60 mL/min Final   Comment: (NOTE) The eGFR has been calculated using the CKD EPI equation. This calculation has not been validated in all clinical situations. eGFR's persistently <60 mL/min signify possible Chronic Kidney Disease.   Georgiann Hahn gap 11/26/2016 9  5 - 15 Final    Assessment:  Luis [name]. is a 71 y.o. male with stage III multiple myeloma. He presented in 02/2013 with left-sided sharp pain. Evaluation revealed wide-spread lytic lesions including fracture of the left 5th rib laterally. CT scans on 02/22/2013 showed innumerable lytic lesions in thoracic spine, sternum, clavicle, scapula, and ribs.   Bone marrow biopsy on 03/13/2013 revealed 15 to 20% plasma cells. Iron stores were absent. Renal function was normal. SPEP on 03/01/2013 revealed a 1.4 gm/dL IgG monoclonal lambda. IgG was 1987.   He began induction with Revlimid 25 mg a day (3 weeks on and 1 week off) and Decadron (40 mg once a  week) on 04/09/2013. SPEP has revealed no monoclonal protein since 07/26/2014 (last check 10/22/2016). IgG was 1362 on 07/26/2014 and 1097 on 12/19/2014.   Lambda free light chains have been monitored: 33.20 (ratio of 1.56) on 09/20/2014, 31.51 (ratio of 1.43) on 12/19/2014, 30.25 (ratio of 1.56) on 05/01/2015, 31.95 (ratio 1.49) on 01/01/2016, 28.02 (ratio 1.29) on 03/25/2016, 31.5 (ratio 1.26) on 05/28/2016, 21.8 (ratio 1.35) on 08/20/2016, and 37.3 (ratio 1.09) on 10/22/2016.  24 hour UPEP on 12/02/2015 revealed no monoclonal protein.  With remission, Revlimid was decreased to 10 mg a day then to 10 mg every other day secondary to issues with renal function and diarrhea.  Revlimid was held on 09/17/2016 secondary to neutropenia (ANC 800) then restarted on 10/05/2016.  Bone survey on 05/30/2015 revealed the majority of small lytic lesions were stable.  There was possibly new lesion in the distal left clavicle and right mid femur (small) and a possible developing lucency in the proximal left femoral shaft.  The calvarial lesion noted on the prior study was not well seen (? positional).  Bone survey on 11/27/2015 revealed widespread bony lytic lesions consistent with multiple myeloma.  The vast majority of lesions were stable.  A few small lesions were not previously seen.  One lesion was previously obscured by bowel contents.  Two small lesions in the right distal femoral diaphysis appeared new.  Bone survey on 05/25/2016 revealed no evidence of progressive myelomatous involvement of the skeleton.  He receives Zometa every 3 months (last 07/23/2016). He takes calcium 750 mg BID. He receives B12 monthly (last 10/22/2016). B12 was 370 on 01/17/2015.  Folate was 17.5 on 09/17/2016.  Abdominal and pelvic CT scan on 07/15/2015 revealed  extensive sigmoid diverticulosis. There was questionable wall thickening of the ascending colon versus artifact from incomplete distention.  Colonoscopy on 11/11/2015  revealed diverticulosis in the sigmoid colon and distal descending colon and ascending colon.  There was a 2 mm polyp in the mid sigmoid colon.  Pathology revealed a hyperplastic polyp, negative for dysplasia or malignancy.  Symptomatically, he has had "the crud".  He has leg cramps at night.  Exam is stable.  ANC is slightly low (1100).  Calcium is normal.   Plan: 1.  Labs today:  CBC with diff, CMP, SPEP.  2.  Hold Revlimid.  ANC is borderline.  Discuss plan for 3 weeks on/1 week off. 3.  Zometa today. 4.  B12 today. 5.  Continue oral calcium supplementation. 6.  Continue Revlimid 10 mg QOD (3 weeks on/1 week off). 7.  Check CBC with diff on 12/06/2016 (planned start date of next Revlimid cycle). 8.  RTC in 1 month for MD assessment, labs (CBC with diff, CMP, SPEP), and B12.   Lequita Asal, MD  11/26/2016, 10:46 AM

## 2016-11-26 ENCOUNTER — Inpatient Hospital Stay: Payer: BLUE CROSS/BLUE SHIELD

## 2016-11-26 ENCOUNTER — Other Ambulatory Visit: Payer: Self-pay | Admitting: Hematology and Oncology

## 2016-11-26 ENCOUNTER — Inpatient Hospital Stay: Payer: BLUE CROSS/BLUE SHIELD | Attending: Hematology and Oncology | Admitting: Hematology and Oncology

## 2016-11-26 ENCOUNTER — Encounter: Payer: Self-pay | Admitting: Hematology and Oncology

## 2016-11-26 VITALS — BP 135/73 | HR 71 | Temp 97.4°F | Resp 18 | Ht 67.0 in | Wt 210.0 lb

## 2016-11-26 DIAGNOSIS — F329 Major depressive disorder, single episode, unspecified: Secondary | ICD-10-CM | POA: Insufficient documentation

## 2016-11-26 DIAGNOSIS — E538 Deficiency of other specified B group vitamins: Secondary | ICD-10-CM | POA: Diagnosis not present

## 2016-11-26 DIAGNOSIS — E785 Hyperlipidemia, unspecified: Secondary | ICD-10-CM

## 2016-11-26 DIAGNOSIS — Z8719 Personal history of other diseases of the digestive system: Secondary | ICD-10-CM | POA: Insufficient documentation

## 2016-11-26 DIAGNOSIS — M4800 Spinal stenosis, site unspecified: Secondary | ICD-10-CM

## 2016-11-26 DIAGNOSIS — G4730 Sleep apnea, unspecified: Secondary | ICD-10-CM | POA: Diagnosis not present

## 2016-11-26 DIAGNOSIS — I10 Essential (primary) hypertension: Secondary | ICD-10-CM | POA: Insufficient documentation

## 2016-11-26 DIAGNOSIS — C9000 Multiple myeloma not having achieved remission: Secondary | ICD-10-CM

## 2016-11-26 DIAGNOSIS — I25119 Atherosclerotic heart disease of native coronary artery with unspecified angina pectoris: Secondary | ICD-10-CM | POA: Diagnosis not present

## 2016-11-26 DIAGNOSIS — I509 Heart failure, unspecified: Secondary | ICD-10-CM | POA: Diagnosis not present

## 2016-11-26 DIAGNOSIS — R0602 Shortness of breath: Secondary | ICD-10-CM

## 2016-11-26 DIAGNOSIS — Z7982 Long term (current) use of aspirin: Secondary | ICD-10-CM | POA: Diagnosis not present

## 2016-11-26 DIAGNOSIS — Z79899 Other long term (current) drug therapy: Secondary | ICD-10-CM | POA: Insufficient documentation

## 2016-11-26 DIAGNOSIS — M129 Arthropathy, unspecified: Secondary | ICD-10-CM | POA: Insufficient documentation

## 2016-11-26 DIAGNOSIS — F419 Anxiety disorder, unspecified: Secondary | ICD-10-CM | POA: Diagnosis not present

## 2016-11-26 DIAGNOSIS — D701 Agranulocytosis secondary to cancer chemotherapy: Secondary | ICD-10-CM

## 2016-11-26 DIAGNOSIS — Z87891 Personal history of nicotine dependence: Secondary | ICD-10-CM | POA: Insufficient documentation

## 2016-11-26 DIAGNOSIS — Z86010 Personal history of colonic polyps: Secondary | ICD-10-CM | POA: Diagnosis not present

## 2016-11-26 DIAGNOSIS — D538 Other specified nutritional anemias: Secondary | ICD-10-CM | POA: Diagnosis not present

## 2016-11-26 DIAGNOSIS — R252 Cramp and spasm: Secondary | ICD-10-CM | POA: Diagnosis not present

## 2016-11-26 DIAGNOSIS — I252 Old myocardial infarction: Secondary | ICD-10-CM | POA: Insufficient documentation

## 2016-11-26 DIAGNOSIS — T451X5A Adverse effect of antineoplastic and immunosuppressive drugs, initial encounter: Secondary | ICD-10-CM

## 2016-11-26 DIAGNOSIS — Z7984 Long term (current) use of oral hypoglycemic drugs: Secondary | ICD-10-CM | POA: Diagnosis not present

## 2016-11-26 DIAGNOSIS — E119 Type 2 diabetes mellitus without complications: Secondary | ICD-10-CM | POA: Diagnosis not present

## 2016-11-26 DIAGNOSIS — I2510 Atherosclerotic heart disease of native coronary artery without angina pectoris: Secondary | ICD-10-CM | POA: Diagnosis not present

## 2016-11-26 DIAGNOSIS — K219 Gastro-esophageal reflux disease without esophagitis: Secondary | ICD-10-CM | POA: Insufficient documentation

## 2016-11-26 LAB — COMPREHENSIVE METABOLIC PANEL
ALT: 27 U/L (ref 17–63)
AST: 36 U/L (ref 15–41)
Albumin: 4 g/dL (ref 3.5–5.0)
Alkaline Phosphatase: 44 U/L (ref 38–126)
Anion gap: 9 (ref 5–15)
BUN: 27 mg/dL — ABNORMAL HIGH (ref 6–20)
CO2: 20 mmol/L — ABNORMAL LOW (ref 22–32)
Calcium: 9.4 mg/dL (ref 8.9–10.3)
Chloride: 104 mmol/L (ref 101–111)
Creatinine, Ser: 1.15 mg/dL (ref 0.61–1.24)
GFR calc Af Amer: >60 mL/min (ref >60)
GFR calc non Af Amer: >60 mL/min (ref >60)
Glucose, Bld: 186 mg/dL — ABNORMAL HIGH (ref 65–99)
Potassium: 4.7 mmol/L (ref 3.5–5.1)
Sodium: 133 mmol/L — ABNORMAL LOW (ref 135–145)
Total Bilirubin: 0.6 mg/dL (ref 0.3–1.2)
Total Protein: 7.9 g/dL (ref 6.5–8.1)

## 2016-11-26 LAB — CBC WITH DIFFERENTIAL/PLATELET
Basophils Absolute: 0.1 10*3/uL (ref 0–0.1)
Basophils Relative: 3 %
Eosinophils Absolute: 0.4 10*3/uL (ref 0–0.7)
Eosinophils Relative: 14 %
HCT: 34.9 % — ABNORMAL LOW (ref 40.0–52.0)
Hemoglobin: 12.3 g/dL — ABNORMAL LOW (ref 13.0–18.0)
Lymphocytes Relative: 34 %
Lymphs Abs: 1.1 10*3/uL (ref 1.0–3.6)
MCH: 36.9 pg — ABNORMAL HIGH (ref 26.0–34.0)
MCHC: 35.3 g/dL (ref 32.0–36.0)
MCV: 104.4 fL — ABNORMAL HIGH (ref 80.0–100.0)
Monocytes Absolute: 0.4 10*3/uL (ref 0.2–1.0)
Monocytes Relative: 14 %
Neutro Abs: 1.1 10*3/uL — ABNORMAL LOW (ref 1.4–6.5)
Neutrophils Relative %: 35 %
Platelets: 169 10*3/uL (ref 150–440)
RBC: 3.34 MIL/uL — ABNORMAL LOW (ref 4.40–5.90)
RDW: 13.4 % (ref 11.5–14.5)
WBC: 3.2 10*3/uL — ABNORMAL LOW (ref 3.8–10.6)

## 2016-11-26 MED ORDER — CYANOCOBALAMIN 1000 MCG/ML IJ SOLN
1000.0000 ug | Freq: Once | INTRAMUSCULAR | Status: AC
  Administered 2016-11-26: 1000 ug via INTRAMUSCULAR
  Filled 2016-11-26: qty 1

## 2016-11-26 MED ORDER — ZOLEDRONIC ACID 4 MG/100ML IV SOLN
4.0000 mg | Freq: Once | INTRAVENOUS | Status: AC
  Administered 2016-11-26: 4 mg via INTRAVENOUS
  Filled 2016-11-26: qty 100

## 2016-11-26 MED ORDER — SODIUM CHLORIDE 0.9 % IV SOLN
Freq: Once | INTRAVENOUS | Status: AC
  Administered 2016-11-26: 11:00:00 via INTRAVENOUS
  Filled 2016-11-26: qty 1000

## 2016-11-26 NOTE — Progress Notes (Signed)
Pt having lots of cramping in legs at night

## 2016-11-29 LAB — PROTEIN ELECTROPHORESIS, SERUM
A/G Ratio: 1 (ref 0.7–1.7)
Albumin ELP: 3.8 g/dL (ref 2.9–4.4)
Alpha-1-Globulin: 0.3 g/dL (ref 0.0–0.4)
Alpha-2-Globulin: 0.9 g/dL (ref 0.4–1.0)
Beta Globulin: 1.3 g/dL (ref 0.7–1.3)
Gamma Globulin: 1.4 g/dL (ref 0.4–1.8)
Globulin, Total: 3.8 g/dL (ref 2.2–3.9)
PDF: 0
Total Protein ELP: 7.6 g/dL (ref 6.0–8.5)

## 2016-12-01 ENCOUNTER — Other Ambulatory Visit: Payer: Self-pay | Admitting: Hematology and Oncology

## 2016-12-06 ENCOUNTER — Inpatient Hospital Stay: Payer: BLUE CROSS/BLUE SHIELD

## 2016-12-06 DIAGNOSIS — E538 Deficiency of other specified B group vitamins: Secondary | ICD-10-CM

## 2016-12-06 DIAGNOSIS — C9000 Multiple myeloma not having achieved remission: Secondary | ICD-10-CM | POA: Diagnosis not present

## 2016-12-06 LAB — CBC WITH DIFFERENTIAL/PLATELET
Basophils Absolute: 0.1 10*3/uL (ref 0–0.1)
Basophils Relative: 3 %
Eosinophils Absolute: 0.2 10*3/uL (ref 0–0.7)
Eosinophils Relative: 5 %
HCT: 35.7 % — ABNORMAL LOW (ref 40.0–52.0)
Hemoglobin: 12.4 g/dL — ABNORMAL LOW (ref 13.0–18.0)
Lymphocytes Relative: 33 %
Lymphs Abs: 1.2 10*3/uL (ref 1.0–3.6)
MCH: 36.4 pg — ABNORMAL HIGH (ref 26.0–34.0)
MCHC: 34.8 g/dL (ref 32.0–36.0)
MCV: 104.8 fL — ABNORMAL HIGH (ref 80.0–100.0)
Monocytes Absolute: 0.5 10*3/uL (ref 0.2–1.0)
Monocytes Relative: 15 %
Neutro Abs: 1.6 10*3/uL (ref 1.4–6.5)
Neutrophils Relative %: 44 %
Platelets: 149 10*3/uL — ABNORMAL LOW (ref 150–440)
RBC: 3.41 MIL/uL — ABNORMAL LOW (ref 4.40–5.90)
RDW: 13.1 % (ref 11.5–14.5)
WBC: 3.5 10*3/uL — ABNORMAL LOW (ref 3.8–10.6)

## 2016-12-23 ENCOUNTER — Other Ambulatory Visit: Payer: Self-pay | Admitting: Hematology and Oncology

## 2016-12-26 ENCOUNTER — Other Ambulatory Visit: Payer: Self-pay | Admitting: Hematology and Oncology

## 2016-12-27 ENCOUNTER — Other Ambulatory Visit: Payer: Self-pay | Admitting: *Deleted

## 2016-12-27 MED ORDER — POTASSIUM CHLORIDE CRYS ER 20 MEQ PO TBCR: EXTENDED_RELEASE_TABLET | ORAL | 2 refills | Status: DC

## 2016-12-27 NOTE — Telephone Encounter (Signed)
Called [facility] to confirm receipt of rx and did not get duplicates

## 2017-01-02 ENCOUNTER — Other Ambulatory Visit: Payer: Self-pay | Admitting: Hematology and Oncology

## 2017-01-02 NOTE — Progress Notes (Signed)
[facility] day:  01/03/17   Chief Complaint: Luis Paff. is a 71 y.o. male with multiple myeloma on Revlimid who is seen for monthly assessment on Revlimid prior to monthly B12.  HPI:  The patient was last seen in the medical oncology clinic on 11/26/2016.  At that time, he described leg cramps at night.  Calcium was normal.  He received Zometa.  His ANC was borderline.  We discussed Revlimid 3 weeks on and 1 week off.  He was to start on 12/06/2016 if his counts were good.  He received B12.  SPEP revealed no monoclonal protein.  CBC on 12/06/2016 revealed a hematocrit 35.7, hemoglobin 12.4, MCV 104.8, platelets 149,000, white count 3500 with an ANC of 1600.  During the interim, he has been a little tired.  He has been off Revlimid (last 12/27/2016).  He describes receiving a steroid injection for spinal stenosis 2 weeks ago.  He had a precancerous lesion frozen off of his forehead.                                                                                                                                                                                                                                                       Past Medical History:  Diagnosis Date  . Angina pectoris ([place])   . Anxiety   . Arthritis   . CHF (congestive heart failure) ([place])   . Coronary artery disease   . Depression   . Diabetes mellitus without complication [facility])    Patient takes Metformin.  . Diverticulosis   . GERD (gastroesophageal reflux disease)   . Hyperlipidemia   . Hypertension   . Multiple myeloma ([place])   . Multiple myeloma ([place]) 03/27/2015  . Myocardial infarction April 2001   widowmaker  . Shortness of breath dyspnea   . Sleep apnea    No CPAP @ present  . Spinal stenosis     Past Surgical History:  Procedure Laterality Date  . CARDIAC CATHETERIZATION    . CARPAL TUNNEL RELEASE    . CATARACT EXTRACTION    . COLONOSCOPY WITH PROPOFOL N/A  11/11/2015   Procedure: COLONOSCOPY WITH PROPOFOL;  Surgeon: Lollie Sails, MD;  Location: [facility] ENDOSCOPY;  Service: Endoscopy;  Laterality: N/A;  . CORONARY ARTERY  BYPASS GRAFT    . EYE SURGERY Bilateral    Cataract Extraction  . INGUINAL HERNIA REPAIR    . JOINT REPLACEMENT Right 2008   Right Total Hip Replacement  . PILONIDAL CYST EXCISION    . ROTATOR CUFF REPAIR    . TOTAL HIP ARTHROPLASTY Right   . VENTRAL HERNIA REPAIR N/A 08/15/2015   Procedure: VENTRAL HERNIA REPAIR WITH MESH ;  Surgeon: Leonie Green, MD;  Location: [facility] ORS;  Service: General;  Laterality: N/A;    Family History  Problem Relation Age of Onset  . Heart disease Father   . Stroke Mother   . Prostate cancer Maternal Grandfather 28    Social History:  reports that he quit smoking about 16 years ago. His smoking use included Cigarettes. He has a 60.00 pack-year smoking history. He quit smokeless tobacco use about 16 years ago. He reports that he does not drink alcohol or use drugs.  He notes that his wife goes off to work.  He states that his wife would like to retire, but that would affect his medication coverage.  He has projects that he likes to do at home.  He does wood working.  His 42nd high school graduation anniversary was in 06/2016.  He has a new grand-daughter, [name].  The patient is alone today.  Allergies:  Allergies  Allergen Reactions  . Pravachol [Pravastatin]   . Pravastatin Sodium Other (See Comments)  . Statins Other (See Comments)    Muscle aches  . Zocor [Simvastatin] Other (See Comments)    Muscle aches    Current Medications: Current Outpatient Prescriptions  Medication Sig Dispense Refill  . acetaminophen (TYLENOL) 500 MG tablet Take 500 mg by mouth every 6 (six) hours as needed.    . ALPRAZolam (XANAX) 0.25 MG tablet Take 0.25 mg by mouth at bedtime as needed. for sleep  3  . aspirin 81 MG tablet Take 81 mg by mouth daily.     [name] Pollen 500 MG CHEW Chew 1 tablet  by mouth daily.    . Calcium Carbonate-Vitamin D 600-400 MG-UNIT chew tablet Chew 4 tablets by mouth daily. Takes them throughout the day for a total of 4    . Cyanocobalamin (VITAMIN B-12 IJ) Inject 1 Dose as directed every 30 (thirty) days.     . ergocalciferol (VITAMIN D2) 50000 UNITS capsule Take 50,000 Units by mouth once a week.    Marland Kitchen lisinopril (PRINIVIL,ZESTRIL) 20 MG tablet Take 1 tablet by mouth daily.    Marland Kitchen lovastatin (MEVACOR) 40 MG tablet Take 20 mg by mouth at bedtime. 2 tablets a day    . metFORMIN (GLUCOPHAGE) 500 MG tablet 500 mg daily with breakfast.     . omeprazole (PRILOSEC) 20 MG capsule Take 20 mg by mouth daily.    . potassium chloride SA (KLOR-CON M20) 20 MEQ tablet TAKE 1 TABLET (20 MEQ TOTAL) BY MOUTH DAILY. 30 tablet 2  . traMADol (ULTRAM) 50 MG tablet Take 1 tablet (50 mg total) by mouth 3 (three) times daily as needed. 60 tablet 0  . Zoledronic Acid (ZOMETA) 4 MG/100ML IVPB Inject into the vein.    Marland Kitchen amoxicillin (AMOXIL) 875 MG tablet     . REVLIMID 10 MG capsule TAKE 1 CAPSULE (10MG) BY MOUTH EVERY OTHER DAY (Patient not taking: Reported on 01/03/2017) 14 capsule 1   No current facility-administered medications for this visit.     Review of Systems:  GENERAL:  Feels "a little tired".  No fevers or sweats.  Weight up 5 pounds. PERFORMANCE STATUS (ECOG): 1 HEENT: No visual changes, runny nose, sore throat, mouth sores or tenderness. Lungs:  No shortness of breath or cough. No hemoptysis. Cardiac: No chest pain, palpitations, orthopnea, or PND.  Pre-op echo and stress test good. GI: Appetite 505.  No nausea, vomiting, diarrhea, melena or hematochezia. GU: No urgency, frequency, dysuria, or hematuria. Musculoskeletal: Spinal stenosis causes legs aches. No joint pain. No muscle tenderness. Extremities: Leg cramps.  No swelling. Skin: Precancerous lesion on forehead (see HPI).  No rashes or skin changes. Neuro: No headache, numbness or weakness, balance  or coordination issues. Injection for spinal stenosis. Endocrine: Diabetes.  No thyroid issues, hot flashes or night sweats. Psych: Sleeping poorly.  No mood changes, depression or anxiety. Pain: No focal pain. Review of systems: All other systems reviewed and found to be negative.  Physical Exam: Blood pressure (!) 145/72, pulse (!) 57, temperature 97.8 F (36.6 C), temperature source Tympanic, resp. rate 18, weight 215 lb 6.2 oz (97.7 kg), SpO2 97 %.  GENERAL: Well developed, well nourished, elderly gentleman sitting comfortably in the exam room in no acute distress.  He has a cane at his side. MENTAL STATUS: Alert and oriented to person, place and time. HEAD: Short white hair. Male pattern baldness. Normocephalic, atraumatic, face symmetric, no Cushingoid features. EYES: Blue eyes. Pupils equal round and reactive to light and accomodation. No conjunctivitis or scleral icterus. ENT: Hoarse.  Oropharynx clear without lesion. Tongue normal. Mucous membranes moist.  RESPIRATORY: Clear to auscultation without rales, wheezes or rhonchi. CARDIOVASCULAR: Regular rate and rhythm without murmur, rub or gallop. ABDOMEN: Soft, non-tender, with active bowel sounds, and no hepatosplenomegaly. No masses. SKIN: No rashes, ulcers or lesions. EXTREMITIES: No edema, no skin discoloration or tenderness. No palpable cords. LYMPH NODES: No palpable cervical, supraclavicular, axillary or inguinal adenopathy  NEUROLOGICAL: Unremarkable. PSYCH: Appropriate   Appointment on 01/03/2017  Component Date Value Ref Range Status  . WBC 01/03/2017 2.6* 3.8 - 10.6 K/uL Final  . RBC 01/03/2017 3.30* 4.40 - 5.90 MIL/uL Final  . Hemoglobin 01/03/2017 12.3* 13.0 - 18.0 g/dL Final  . HCT 01/03/2017 34.5* 40.0 - 52.0 % Final  . MCV 01/03/2017 104.6* 80.0 - 100.0 fL Final  . MCH 01/03/2017 37.2* 26.0 - 34.0 pg Final  . MCHC 01/03/2017 35.6  32.0 - 36.0 g/dL Final  . RDW 01/03/2017 14.0  11.5 - 14.5  % Final  . Platelets 01/03/2017 159  150 - 440 K/uL Final  . Neutrophils Relative % 01/03/2017 33  % Final  . Neutro Abs 01/03/2017 0.8* 1.4 - 6.5 K/uL Final  . Lymphocytes Relative 01/03/2017 43  % Final  . Lymphs Abs 01/03/2017 1.1  1.0 - 3.6 K/uL Final  . Monocytes Relative 01/03/2017 16  % Final  . Monocytes Absolute 01/03/2017 0.4  0.2 - 1.0 K/uL Final  . Eosinophils Relative 01/03/2017 6  % Final  . Eosinophils Absolute 01/03/2017 0.2  0 - 0.7 K/uL Final  . Basophils Relative 01/03/2017 2  % Final  . Basophils Absolute 01/03/2017 0.1  0 - 0.1 K/uL Final  . Sodium 01/03/2017 138  135 - 145 mmol/L Final  . Potassium 01/03/2017 4.5  3.5 - 5.1 mmol/L Final  . Chloride 01/03/2017 105  101 - 111 mmol/L Final  . CO2 01/03/2017 26  22 - 32 mmol/L Final  . Glucose, Bld 01/03/2017 128* 65 - 99 mg/dL Final  . BUN 01/03/2017 18  6 - 20  mg/dL Final  . Creatinine, Ser 01/03/2017 0.94  0.61 - 1.24 mg/dL Final  . Calcium 01/03/2017 9.4  8.9 - 10.3 mg/dL Final  . Total Protein 01/03/2017 8.0  6.5 - 8.1 g/dL Final  . Albumin 01/03/2017 4.0  3.5 - 5.0 g/dL Final  . AST 01/03/2017 50* 15 - 41 U/L Final  . ALT 01/03/2017 31  17 - 63 U/L Final  . Alkaline Phosphatase 01/03/2017 54  38 - 126 U/L Final  . Total Bilirubin 01/03/2017 0.6  0.3 - 1.2 mg/dL Final  . GFR calc non Af Amer 01/03/2017 >60  >60 mL/min Final  . GFR calc Af Amer 01/03/2017 >60  >60 mL/min Final   Comment: (NOTE) The eGFR has been calculated using the CKD EPI equation. This calculation has not been validated in all clinical situations. eGFR's persistently <60 mL/min signify possible Chronic Kidney Disease.   . Anion gap 01/03/2017 7  5 - 15 Final  . Magnesium 01/03/2017 1.7  1.7 - 2.4 mg/dL Final    Assessment:  Luis [name]. is a 71 y.o. male with stage III multiple myeloma. He presented in 02/2013 with left-sided sharp pain. Evaluation revealed wide-spread lytic lesions including fracture of the left 5th rib  laterally. CT scans on 02/22/2013 showed innumerable lytic lesions in thoracic spine, sternum, clavicle, scapula, and ribs.   Bone marrow biopsy on 03/13/2013 revealed 15 to 20% plasma cells. Iron stores were absent. Renal function was normal. SPEP on 03/01/2013 revealed a 1.4 gm/dL IgG monoclonal lambda. IgG was 1987.   He began induction with Revlimid 25 mg a day (3 weeks on and 1 week off) and Decadron (40 mg once a week) on 04/09/2013. SPEP has revealed no monoclonal protein since 07/26/2014 (last check 01/03/2017). IgG was 1362 on 07/26/2014 and 1097 on 12/19/2014.   Lambda free light chains have been monitored: 33.20 (ratio of 1.56) on 09/20/2014, 31.51 (ratio of 1.43) on 12/19/2014, 30.25 (ratio of 1.56) on 05/01/2015, 31.95 (ratio 1.49) on 01/01/2016, 28.02 (ratio 1.29) on 03/25/2016, 31.5 (ratio 1.26) on 05/28/2016, 21.8 (ratio 1.35) on 08/20/2016, and 37.3 (ratio 1.09) on 10/22/2016.  24 hour UPEP on 12/02/2015 revealed no monoclonal protein.  With remission, Revlimid was decreased to 10 mg a day then to 10 mg every other day secondary to issues with renal function and diarrhea.  Revlimid was held on 09/17/2016 secondary to neutropenia (ANC 800) then restarted on 10/05/2016.  Bone survey on 05/30/2015 revealed the majority of small lytic lesions were stable.  There was possibly new lesion in the distal left clavicle and right mid femur (small) and a possible developing lucency in the proximal left femoral shaft.  The calvarial lesion noted on the prior study was not well seen (? positional).  Bone survey on 11/27/2015 revealed widespread bony lytic lesions consistent with multiple myeloma.  The vast majority of lesions were stable.  A few small lesions were not previously seen.  One lesion was previously obscured by bowel contents.  Two small lesions in the right distal femoral diaphysis appeared new.  Bone survey on 05/25/2016 revealed no evidence of progressive myelomatous  involvement of the skeleton.  He receives Zometa every 3 months (last 11/26/2016). He takes calcium 750 mg BID. He receives B12 monthly (last 11/26/2016). B12 was 370 on 01/17/2015.  Folate was 17.5 on 09/17/2016.  Abdominal and pelvic CT scan on 07/15/2015 revealed  extensive sigmoid diverticulosis. There was questionable wall thickening of the ascending colon versus artifact from incomplete distention.  Colonoscopy on 11/11/2015 revealed diverticulosis in the sigmoid colon and distal descending colon and ascending colon.  There was a 2 mm polyp in the mid sigmoid colon.  Pathology revealed a hyperplastic polyp, negative for dysplasia or malignancy.  Symptomatically, he feels a little tired.  Exam is stable.  Hematocrit is 34.5.  ANC is low (800).  Platelet count is 159,000.  Calcium is normal.   Plan: 1.  Labs today:  CBC with diff, CMP, SPEP, Mg.  2.  Discuss counts today.  Discuss neutropenic precautions.  Discuss holding Revlimid x 2 weeks. 3.  B12 today. 4.  RTC on 01/17/2017 for labs (CBC with diff, TSH) 5.  RTC in 4 weeks for MD assessment, labs (CBC with diff, CMP, SPEP), and B12.   Lequita Asal, MD  01/03/2017, 10:30 AM

## 2017-01-03 ENCOUNTER — Encounter: Payer: Self-pay | Admitting: Hematology and Oncology

## 2017-01-03 ENCOUNTER — Inpatient Hospital Stay: Payer: BLUE CROSS/BLUE SHIELD

## 2017-01-03 ENCOUNTER — Other Ambulatory Visit: Payer: Self-pay | Admitting: Hematology and Oncology

## 2017-01-03 ENCOUNTER — Inpatient Hospital Stay: Payer: BLUE CROSS/BLUE SHIELD | Attending: Hematology and Oncology | Admitting: Hematology and Oncology

## 2017-01-03 VITALS — BP 145/72 | HR 57 | Temp 97.8°F | Resp 18 | Wt 215.4 lb

## 2017-01-03 DIAGNOSIS — E538 Deficiency of other specified B group vitamins: Secondary | ICD-10-CM

## 2017-01-03 DIAGNOSIS — C9000 Multiple myeloma not having achieved remission: Secondary | ICD-10-CM

## 2017-01-03 DIAGNOSIS — R0602 Shortness of breath: Secondary | ICD-10-CM

## 2017-01-03 DIAGNOSIS — K219 Gastro-esophageal reflux disease without esophagitis: Secondary | ICD-10-CM | POA: Diagnosis not present

## 2017-01-03 DIAGNOSIS — M4800 Spinal stenosis, site unspecified: Secondary | ICD-10-CM | POA: Diagnosis not present

## 2017-01-03 DIAGNOSIS — E785 Hyperlipidemia, unspecified: Secondary | ICD-10-CM | POA: Diagnosis not present

## 2017-01-03 DIAGNOSIS — G4730 Sleep apnea, unspecified: Secondary | ICD-10-CM | POA: Diagnosis not present

## 2017-01-03 DIAGNOSIS — I252 Old myocardial infarction: Secondary | ICD-10-CM | POA: Diagnosis not present

## 2017-01-03 DIAGNOSIS — I25119 Atherosclerotic heart disease of native coronary artery with unspecified angina pectoris: Secondary | ICD-10-CM | POA: Insufficient documentation

## 2017-01-03 DIAGNOSIS — D702 Other drug-induced agranulocytosis: Secondary | ICD-10-CM

## 2017-01-03 DIAGNOSIS — Z7984 Long term (current) use of oral hypoglycemic drugs: Secondary | ICD-10-CM | POA: Diagnosis not present

## 2017-01-03 DIAGNOSIS — Z7982 Long term (current) use of aspirin: Secondary | ICD-10-CM | POA: Diagnosis not present

## 2017-01-03 DIAGNOSIS — F329 Major depressive disorder, single episode, unspecified: Secondary | ICD-10-CM | POA: Insufficient documentation

## 2017-01-03 DIAGNOSIS — F17210 Nicotine dependence, cigarettes, uncomplicated: Secondary | ICD-10-CM | POA: Insufficient documentation

## 2017-01-03 DIAGNOSIS — I10 Essential (primary) hypertension: Secondary | ICD-10-CM | POA: Diagnosis not present

## 2017-01-03 DIAGNOSIS — R252 Cramp and spasm: Secondary | ICD-10-CM

## 2017-01-03 DIAGNOSIS — E119 Type 2 diabetes mellitus without complications: Secondary | ICD-10-CM | POA: Diagnosis not present

## 2017-01-03 DIAGNOSIS — Z79899 Other long term (current) drug therapy: Secondary | ICD-10-CM | POA: Diagnosis not present

## 2017-01-03 DIAGNOSIS — Z8719 Personal history of other diseases of the digestive system: Secondary | ICD-10-CM | POA: Diagnosis not present

## 2017-01-03 DIAGNOSIS — I509 Heart failure, unspecified: Secondary | ICD-10-CM | POA: Diagnosis not present

## 2017-01-03 DIAGNOSIS — F419 Anxiety disorder, unspecified: Secondary | ICD-10-CM | POA: Diagnosis not present

## 2017-01-03 LAB — COMPREHENSIVE METABOLIC PANEL
ALT: 31 U/L (ref 17–63)
AST: 50 U/L — ABNORMAL HIGH (ref 15–41)
Albumin: 4 g/dL (ref 3.5–5.0)
Alkaline Phosphatase: 54 U/L (ref 38–126)
Anion gap: 7 (ref 5–15)
BUN: 18 mg/dL (ref 6–20)
CO2: 26 mmol/L (ref 22–32)
Calcium: 9.4 mg/dL (ref 8.9–10.3)
Chloride: 105 mmol/L (ref 101–111)
Creatinine, Ser: 0.94 mg/dL (ref 0.61–1.24)
GFR calc Af Amer: >60 mL/min (ref >60)
GFR calc non Af Amer: >60 mL/min (ref >60)
Glucose, Bld: 128 mg/dL — ABNORMAL HIGH (ref 65–99)
Potassium: 4.5 mmol/L (ref 3.5–5.1)
Sodium: 138 mmol/L (ref 135–145)
Total Bilirubin: 0.6 mg/dL (ref 0.3–1.2)
Total Protein: 8 g/dL (ref 6.5–8.1)

## 2017-01-03 LAB — CBC WITH DIFFERENTIAL/PLATELET
Basophils Absolute: 0.1 10*3/uL (ref 0–0.1)
Basophils Relative: 2 %
Eosinophils Absolute: 0.2 10*3/uL (ref 0–0.7)
Eosinophils Relative: 6 %
HCT: 34.5 % — ABNORMAL LOW (ref 40.0–52.0)
Hemoglobin: 12.3 g/dL — ABNORMAL LOW (ref 13.0–18.0)
Lymphocytes Relative: 43 %
Lymphs Abs: 1.1 10*3/uL (ref 1.0–3.6)
MCH: 37.2 pg — ABNORMAL HIGH (ref 26.0–34.0)
MCHC: 35.6 g/dL (ref 32.0–36.0)
MCV: 104.6 fL — ABNORMAL HIGH (ref 80.0–100.0)
Monocytes Absolute: 0.4 10*3/uL (ref 0.2–1.0)
Monocytes Relative: 16 %
Neutro Abs: 0.8 10*3/uL — ABNORMAL LOW (ref 1.4–6.5)
Neutrophils Relative %: 33 %
Platelets: 159 10*3/uL (ref 150–440)
RBC: 3.3 MIL/uL — ABNORMAL LOW (ref 4.40–5.90)
RDW: 14 % (ref 11.5–14.5)
WBC: 2.6 10*3/uL — ABNORMAL LOW (ref 3.8–10.6)

## 2017-01-03 LAB — MAGNESIUM: Magnesium: 1.7 mg/dL (ref 1.7–2.4)

## 2017-01-03 MED ORDER — CYANOCOBALAMIN 1000 MCG/ML IJ SOLN
1000.0000 ug | Freq: Once | INTRAMUSCULAR | Status: AC
  Administered 2017-01-03: 1000 ug via INTRAMUSCULAR
  Filled 2017-01-03: qty 1

## 2017-01-05 LAB — PROTEIN ELECTROPHORESIS, SERUM
A/G Ratio: 1.1 (ref 0.7–1.7)
Albumin ELP: 3.9 g/dL (ref 2.9–4.4)
Alpha-1-Globulin: 0.2 g/dL (ref 0.0–0.4)
Alpha-2-Globulin: 0.8 g/dL (ref 0.4–1.0)
Beta Globulin: 1.4 g/dL — ABNORMAL HIGH (ref 0.7–1.3)
Gamma Globulin: 1.1 g/dL (ref 0.4–1.8)
Globulin, Total: 3.6 g/dL (ref 2.2–3.9)
Total Protein ELP: 7.5 g/dL (ref 6.0–8.5)

## 2017-01-06 NOTE — Telephone Encounter (Signed)
Did you decide if you want to decrease his dose?  If so send me a message so I can print it and get someone here to sign it  Thanks  Leana Roe

## 2017-01-06 NOTE — Telephone Encounter (Signed)
  Plan to decrease dose.  He is off for 2 weeks.  M

## 2017-01-07 ENCOUNTER — Other Ambulatory Visit: Payer: Self-pay | Admitting: Hematology and Oncology

## 2017-01-07 NOTE — Telephone Encounter (Signed)
  Is this a new prescription?  M

## 2017-01-07 NOTE — Telephone Encounter (Signed)
No, you gave him 77 with 3 refills in May 2017

## 2017-01-12 ENCOUNTER — Other Ambulatory Visit: Payer: Self-pay | Admitting: Hematology and Oncology

## 2017-01-14 ENCOUNTER — Telehealth: Payer: Self-pay | Admitting: *Deleted

## 2017-01-14 ENCOUNTER — Other Ambulatory Visit: Payer: Self-pay | Admitting: Hematology and Oncology

## 2017-01-14 DIAGNOSIS — C9000 Multiple myeloma not having achieved remission: Secondary | ICD-10-CM

## 2017-01-14 MED ORDER — LENALIDOMIDE 2.5 MG PO CAPS: ORAL_CAPSULE | ORAL | 1 refills | Status: DC

## 2017-01-14 NOTE — Telephone Encounter (Signed)
Called patient to discuss that a new rx of revlimid was submitted but for the pt not to start taking it until we reviewed his labs and got back with him after Monday 01-17-17 voiced understanding.

## 2017-01-17 ENCOUNTER — Inpatient Hospital Stay: Payer: BLUE CROSS/BLUE SHIELD | Attending: Internal Medicine

## 2017-01-17 DIAGNOSIS — I509 Heart failure, unspecified: Secondary | ICD-10-CM | POA: Diagnosis not present

## 2017-01-17 DIAGNOSIS — E538 Deficiency of other specified B group vitamins: Secondary | ICD-10-CM | POA: Insufficient documentation

## 2017-01-17 DIAGNOSIS — Z7982 Long term (current) use of aspirin: Secondary | ICD-10-CM | POA: Insufficient documentation

## 2017-01-17 DIAGNOSIS — E785 Hyperlipidemia, unspecified: Secondary | ICD-10-CM | POA: Insufficient documentation

## 2017-01-17 DIAGNOSIS — C9000 Multiple myeloma not having achieved remission: Secondary | ICD-10-CM | POA: Insufficient documentation

## 2017-01-17 DIAGNOSIS — Z87891 Personal history of nicotine dependence: Secondary | ICD-10-CM | POA: Diagnosis not present

## 2017-01-17 DIAGNOSIS — I252 Old myocardial infarction: Secondary | ICD-10-CM | POA: Diagnosis not present

## 2017-01-17 DIAGNOSIS — J069 Acute upper respiratory infection, unspecified: Secondary | ICD-10-CM | POA: Insufficient documentation

## 2017-01-17 DIAGNOSIS — Z8042 Family history of malignant neoplasm of prostate: Secondary | ICD-10-CM | POA: Insufficient documentation

## 2017-01-17 DIAGNOSIS — E119 Type 2 diabetes mellitus without complications: Secondary | ICD-10-CM | POA: Diagnosis not present

## 2017-01-17 DIAGNOSIS — Z7984 Long term (current) use of oral hypoglycemic drugs: Secondary | ICD-10-CM | POA: Insufficient documentation

## 2017-01-17 DIAGNOSIS — M4800 Spinal stenosis, site unspecified: Secondary | ICD-10-CM | POA: Diagnosis not present

## 2017-01-17 DIAGNOSIS — F329 Major depressive disorder, single episode, unspecified: Secondary | ICD-10-CM | POA: Diagnosis not present

## 2017-01-17 DIAGNOSIS — R5383 Other fatigue: Secondary | ICD-10-CM | POA: Diagnosis not present

## 2017-01-17 DIAGNOSIS — M1990 Unspecified osteoarthritis, unspecified site: Secondary | ICD-10-CM | POA: Diagnosis not present

## 2017-01-17 DIAGNOSIS — I10 Essential (primary) hypertension: Secondary | ICD-10-CM | POA: Diagnosis not present

## 2017-01-17 DIAGNOSIS — Z79899 Other long term (current) drug therapy: Secondary | ICD-10-CM | POA: Diagnosis not present

## 2017-01-17 DIAGNOSIS — R252 Cramp and spasm: Secondary | ICD-10-CM

## 2017-01-17 DIAGNOSIS — K219 Gastro-esophageal reflux disease without esophagitis: Secondary | ICD-10-CM | POA: Diagnosis not present

## 2017-01-17 DIAGNOSIS — Z8719 Personal history of other diseases of the digestive system: Secondary | ICD-10-CM | POA: Insufficient documentation

## 2017-01-17 DIAGNOSIS — I25119 Atherosclerotic heart disease of native coronary artery with unspecified angina pectoris: Secondary | ICD-10-CM | POA: Diagnosis not present

## 2017-01-17 DIAGNOSIS — F419 Anxiety disorder, unspecified: Secondary | ICD-10-CM | POA: Insufficient documentation

## 2017-01-17 DIAGNOSIS — R0602 Shortness of breath: Secondary | ICD-10-CM | POA: Diagnosis not present

## 2017-01-17 LAB — TSH: TSH: 4.139 u[IU]/mL (ref 0.350–4.500)

## 2017-01-17 LAB — CBC WITH DIFFERENTIAL/PLATELET
Basophils Absolute: 0.1 10*3/uL (ref 0–0.1)
Basophils Relative: 2 %
Eosinophils Absolute: 0.2 10*3/uL (ref 0–0.7)
Eosinophils Relative: 4 %
HCT: 38.1 % — ABNORMAL LOW (ref 40.0–52.0)
Hemoglobin: 13.2 g/dL (ref 13.0–18.0)
Lymphocytes Relative: 33 %
Lymphs Abs: 1.5 10*3/uL (ref 1.0–3.6)
MCH: 36.7 pg — ABNORMAL HIGH (ref 26.0–34.0)
MCHC: 34.7 g/dL (ref 32.0–36.0)
MCV: 105.7 fL — ABNORMAL HIGH (ref 80.0–100.0)
Monocytes Absolute: 0.5 10*3/uL (ref 0.2–1.0)
Monocytes Relative: 11 %
Neutro Abs: 2.2 10*3/uL (ref 1.4–6.5)
Neutrophils Relative %: 50 %
Platelets: 182 10*3/uL (ref 150–440)
RBC: 3.61 MIL/uL — ABNORMAL LOW (ref 4.40–5.90)
RDW: 13.9 % (ref 11.5–14.5)
WBC: 4.5 10*3/uL (ref 3.8–10.6)

## 2017-01-20 ENCOUNTER — Telehealth: Payer: Self-pay | Admitting: *Deleted

## 2017-01-20 NOTE — Telephone Encounter (Signed)
Patient states he does not know of being around anyone sick, He will contact his PCP

## 2017-01-20 NOTE — Telephone Encounter (Signed)
Has he been exposed to anyone sick , call PCP, No revlimid while he is sick. Per VO Dr Mike Gip

## 2017-01-20 NOTE — Telephone Encounter (Signed)
Called to report that he has had nasal congestion, burning in the sinuses , sore throat, coughing and sneezing for a week. Has not had any fever. Asking for med to treat it. He is also inquiring about when he is to restart Revlimid at reduced dose, Please advise

## 2017-01-23 DIAGNOSIS — D702 Other drug-induced agranulocytosis: Secondary | ICD-10-CM | POA: Insufficient documentation

## 2017-01-31 ENCOUNTER — Inpatient Hospital Stay: Payer: BLUE CROSS/BLUE SHIELD

## 2017-01-31 ENCOUNTER — Inpatient Hospital Stay (HOSPITAL_BASED_OUTPATIENT_CLINIC_OR_DEPARTMENT_OTHER): Payer: BLUE CROSS/BLUE SHIELD | Admitting: Hematology and Oncology

## 2017-01-31 VITALS — BP 135/69 | HR 66 | Temp 97.0°F | Resp 18 | Wt 213.6 lb

## 2017-01-31 DIAGNOSIS — J069 Acute upper respiratory infection, unspecified: Secondary | ICD-10-CM | POA: Diagnosis not present

## 2017-01-31 DIAGNOSIS — K219 Gastro-esophageal reflux disease without esophagitis: Secondary | ICD-10-CM

## 2017-01-31 DIAGNOSIS — I25119 Atherosclerotic heart disease of native coronary artery with unspecified angina pectoris: Secondary | ICD-10-CM

## 2017-01-31 DIAGNOSIS — Z79899 Other long term (current) drug therapy: Secondary | ICD-10-CM

## 2017-01-31 DIAGNOSIS — I509 Heart failure, unspecified: Secondary | ICD-10-CM

## 2017-01-31 DIAGNOSIS — F419 Anxiety disorder, unspecified: Secondary | ICD-10-CM | POA: Diagnosis not present

## 2017-01-31 DIAGNOSIS — F329 Major depressive disorder, single episode, unspecified: Secondary | ICD-10-CM | POA: Diagnosis not present

## 2017-01-31 DIAGNOSIS — M1990 Unspecified osteoarthritis, unspecified site: Secondary | ICD-10-CM

## 2017-01-31 DIAGNOSIS — R5383 Other fatigue: Secondary | ICD-10-CM | POA: Diagnosis not present

## 2017-01-31 DIAGNOSIS — M4800 Spinal stenosis, site unspecified: Secondary | ICD-10-CM

## 2017-01-31 DIAGNOSIS — Z8042 Family history of malignant neoplasm of prostate: Secondary | ICD-10-CM

## 2017-01-31 DIAGNOSIS — E538 Deficiency of other specified B group vitamins: Secondary | ICD-10-CM

## 2017-01-31 DIAGNOSIS — Z7984 Long term (current) use of oral hypoglycemic drugs: Secondary | ICD-10-CM

## 2017-01-31 DIAGNOSIS — C9000 Multiple myeloma not having achieved remission: Secondary | ICD-10-CM

## 2017-01-31 DIAGNOSIS — E119 Type 2 diabetes mellitus without complications: Secondary | ICD-10-CM | POA: Diagnosis not present

## 2017-01-31 DIAGNOSIS — R252 Cramp and spasm: Secondary | ICD-10-CM

## 2017-01-31 DIAGNOSIS — Z87891 Personal history of nicotine dependence: Secondary | ICD-10-CM

## 2017-01-31 DIAGNOSIS — R0602 Shortness of breath: Secondary | ICD-10-CM | POA: Diagnosis not present

## 2017-01-31 DIAGNOSIS — I252 Old myocardial infarction: Secondary | ICD-10-CM

## 2017-01-31 DIAGNOSIS — Z7982 Long term (current) use of aspirin: Secondary | ICD-10-CM

## 2017-01-31 DIAGNOSIS — E785 Hyperlipidemia, unspecified: Secondary | ICD-10-CM

## 2017-01-31 DIAGNOSIS — I10 Essential (primary) hypertension: Secondary | ICD-10-CM

## 2017-01-31 DIAGNOSIS — Z8719 Personal history of other diseases of the digestive system: Secondary | ICD-10-CM

## 2017-01-31 LAB — CBC WITH DIFFERENTIAL/PLATELET
Basophils Absolute: 0 10*3/uL (ref 0–0.1)
Basophils Relative: 1 %
Eosinophils Absolute: 0.1 10*3/uL (ref 0–0.7)
Eosinophils Relative: 4 %
HCT: 34.9 % — ABNORMAL LOW (ref 40.0–52.0)
Hemoglobin: 12.3 g/dL — ABNORMAL LOW (ref 13.0–18.0)
Lymphocytes Relative: 24 %
Lymphs Abs: 0.9 10*3/uL — ABNORMAL LOW (ref 1.0–3.6)
MCH: 36.6 pg — ABNORMAL HIGH (ref 26.0–34.0)
MCHC: 35.1 g/dL (ref 32.0–36.0)
MCV: 104.5 fL — ABNORMAL HIGH (ref 80.0–100.0)
Monocytes Absolute: 0.3 10*3/uL (ref 0.2–1.0)
Monocytes Relative: 9 %
Neutro Abs: 2.4 10*3/uL (ref 1.4–6.5)
Neutrophils Relative %: 62 %
Platelets: 162 10*3/uL (ref 150–440)
RBC: 3.34 MIL/uL — ABNORMAL LOW (ref 4.40–5.90)
RDW: 13.7 % (ref 11.5–14.5)
WBC: 3.7 10*3/uL — ABNORMAL LOW (ref 3.8–10.6)

## 2017-01-31 LAB — COMPREHENSIVE METABOLIC PANEL
ALT: 25 U/L (ref 17–63)
AST: 32 U/L (ref 15–41)
Albumin: 3.8 g/dL (ref 3.5–5.0)
Alkaline Phosphatase: 44 U/L (ref 38–126)
Anion gap: 8 (ref 5–15)
BUN: 20 mg/dL (ref 6–20)
CO2: 23 mmol/L (ref 22–32)
Calcium: 9 mg/dL (ref 8.9–10.3)
Chloride: 103 mmol/L (ref 101–111)
Creatinine, Ser: 0.93 mg/dL (ref 0.61–1.24)
GFR calc Af Amer: >60 mL/min (ref >60)
GFR calc non Af Amer: >60 mL/min (ref >60)
Glucose, Bld: 118 mg/dL — ABNORMAL HIGH (ref 65–99)
Potassium: 4.6 mmol/L (ref 3.5–5.1)
Sodium: 134 mmol/L — ABNORMAL LOW (ref 135–145)
Total Bilirubin: 0.5 mg/dL (ref 0.3–1.2)
Total Protein: 7.5 g/dL (ref 6.5–8.1)

## 2017-01-31 MED ORDER — CYANOCOBALAMIN 1000 MCG/ML IJ SOLN
1000.0000 ug | Freq: Once | INTRAMUSCULAR | Status: AC
  Administered 2017-01-31: 1000 ug via INTRAMUSCULAR
  Filled 2017-01-31: qty 1

## 2017-01-31 NOTE — Progress Notes (Signed)
Patient states he has had a cold since his last visit.  Also has had steroid injections in his back since last visit.  Appetite has been off due to the cold he has had.  C/o exertional SOB.

## 2017-01-31 NOTE — Progress Notes (Signed)
[facility] day:  01/31/17   Chief Complaint: Luis [name]. is a 71 y.o. male with multiple myeloma on Revlimid who is seen for monthly assessment on Revlimid prior to monthly B12.  HPI:  The patient was last seen in the medical oncology clinic on 01/03/2017.  At that time, he was a little fatigued.  He had been off his Revlimid since 12/27/2016.  ANC was low (800).  Platelet count was 159,000.  Decision was made to hold Revlimid x 2 weeks and restart at a reduced dose.  CBC on 01/17/2017 revealed a hematocrit of 38.1, hemoglobin 13.2, MCV 105.7, platelets 182,000, WBC 4500 with an ANC of 2200.  TSH was 4.139 (normal).  During the interim, he has been tired.  He notes a "rough 3 weeks".  He "got the crud".  He has been coughing.  His wife also got the infection.  His medications got "off kilter".  His appetite has been off.  He has some exertional shortness of breath.  He has not received his Revlimid.                                                                                                                                                                                                                                                       Past Medical History:  Diagnosis Date  . Angina pectoris ([place])   . Anxiety   . Arthritis   . CHF (congestive heart failure) ([place])   . Coronary artery disease   . Depression   . Diabetes mellitus without complication [facility])    Patient takes Metformin.  . Diverticulosis   . GERD (gastroesophageal reflux disease)   . Hyperlipidemia   . Hypertension   . Multiple myeloma ([place])   . Multiple myeloma ([place]) 03/27/2015  . Myocardial infarction April 2001   widowmaker  . Shortness of breath dyspnea   . Sleep apnea    No CPAP @ present  . Spinal stenosis     Past Surgical History:  Procedure Laterality Date  . CARDIAC CATHETERIZATION    . CARPAL TUNNEL RELEASE    . CATARACT EXTRACTION    . COLONOSCOPY  WITH PROPOFOL N/A 11/11/2015   Procedure: COLONOSCOPY WITH PROPOFOL;  Surgeon: Lollie Sails, MD;  Location: [facility] ENDOSCOPY;  Service: Endoscopy;  Laterality: N/A;  . CORONARY ARTERY BYPASS GRAFT    . EYE SURGERY Bilateral    Cataract Extraction  . INGUINAL HERNIA REPAIR    . JOINT REPLACEMENT Right 2008   Right Total Hip Replacement  . PILONIDAL CYST EXCISION    . ROTATOR CUFF REPAIR    . TOTAL HIP ARTHROPLASTY Right   . VENTRAL HERNIA REPAIR N/A 08/15/2015   Procedure: VENTRAL HERNIA REPAIR WITH MESH ;  Surgeon: Leonie Green, MD;  Location: [facility] ORS;  Service: General;  Laterality: N/A;    Family History  Problem Relation Age of Onset  . Heart disease Father   . Stroke Mother   . Prostate cancer Maternal Grandfather 73    Social History:  reports that he quit smoking about 16 years ago. His smoking use included Cigarettes. He has a 60.00 pack-year smoking history. He quit smokeless tobacco use about 16 years ago. He reports that he does not drink alcohol or use drugs.  He notes that his wife goes off to work.  He states that his wife would like to retire, but that would affect his medication coverage.  He has projects that he likes to do at home.  He does wood working.  His 42nd high school graduation anniversary was in 06/2016.  He has a new grand-daughter, [name].  The patient is alone today.  Allergies:  Allergies  Allergen Reactions  . Pravachol [Pravastatin]   . Pravastatin Sodium Other (See Comments)  . Statins Other (See Comments)    Muscle aches  . Zocor [Simvastatin] Other (See Comments)    Muscle aches    Current Medications: Current Outpatient Prescriptions  Medication Sig Dispense Refill  . acetaminophen (TYLENOL) 500 MG tablet Take 500 mg by mouth every 6 (six) hours as needed.    . ALPRAZolam (XANAX) 0.25 MG tablet Take 0.25 mg by mouth at bedtime as needed. for sleep  3  . aspirin 81 MG tablet Take 81 mg by mouth daily.     [name] Pollen 500 MG  CHEW Chew 1 tablet by mouth daily.    . Calcium Carbonate-Vitamin D 600-400 MG-UNIT chew tablet Chew 4 tablets by mouth daily. Takes them throughout the day for a total of 4    . Cyanocobalamin (VITAMIN B-12 IJ) Inject 1 Dose as directed every 30 (thirty) days.     . ergocalciferol (VITAMIN D2) 50000 UNITS capsule Take 50,000 Units by mouth once a week.    Marland Kitchen lisinopril (PRINIVIL,ZESTRIL) 20 MG tablet Take 1 tablet by mouth daily.    Marland Kitchen lovastatin (MEVACOR) 40 MG tablet Take 20 mg by mouth at bedtime. 2 tablets a day    . metFORMIN (GLUCOPHAGE) 500 MG tablet 500 mg daily with breakfast.     . omeprazole (PRILOSEC) 20 MG capsule Take 20 mg by mouth daily.    . potassium chloride SA (KLOR-CON M20) 20 MEQ tablet TAKE 1 TABLET (20 MEQ TOTAL) BY MOUTH DAILY. 30 tablet 2  . REVLIMID 10 MG capsule TAKE 1 CAPSULE (10MG) BY MOUTH EVERY OTHER DAY 14 capsule 1  . traMADol (ULTRAM) 50 MG tablet TAKE 1 TABLET BY MOUTH 3 TIMES A DAY AS NEEDED 60 tablet 3  . Zoledronic Acid (ZOMETA) 4 MG/100ML IVPB Inject into the vein.    Marland Kitchen amoxicillin (AMOXIL) 875 MG tablet     . lenalidomide (REVLIMID) 2.5 MG capsule 2.5 mg po q day for 3 weeks then off for 1 week. (Patient not taking:  Reported on 01/31/2017) 21 capsule 1   No current facility-administered medications for this visit.     Review of Systems:  GENERAL:  Fatigue.  No fevers or sweats.  Weight down 2 pounds. PERFORMANCE STATUS (ECOG): 1 HEENT: No visual changes, runny nose, sore throat, mouth sores or tenderness. Lungs:  No shortness of breath.  Cough due to a URI/"crud". No hemoptysis. Cardiac: No chest pain, palpitations, orthopnea, or PND.  Pre-op echo and stress test good. GI: No nausea, vomiting, diarrhea, melena or hematochezia. GU: No urgency, frequency, dysuria, or hematuria. Musculoskeletal: Spinal stenosis causes legs aches. Interval steroid injection in back.  No joint pain. No muscle tenderness. Extremities: No pain or swelling. Skin:  Precancerous lesion on forehead.  No rashes or skin changes. Neuro: No headache, numbness or weakness, balance or coordination issues. Injection for spinal stenosis. Endocrine: Diabetes.  No thyroid issues, hot flashes or night sweats. Psych: Sleeping poorly.  No mood changes, depression or anxiety. Pain: No focal pain. Review of systems: All other systems reviewed and found to be negative.  Physical Exam: Blood pressure 135/69, pulse 66, temperature 97 F (36.1 C), resp. rate 18, weight 213 lb 9 oz (96.9 kg).  GENERAL: Well developed, well nourished, elderly gentleman sitting comfortably in the exam room in no acute distress.  He has a cane at his side. MENTAL STATUS: Alert and oriented to person, place and time. HEAD: Short white hair. Male pattern baldness. Normocephalic, atraumatic, face symmetric, no Cushingoid features. EYES: Blue eyes. Pupils equal round and reactive to light and accomodation. No conjunctivitis or scleral icterus. ENT: Hoarse.  Oropharynx clear without lesion. Tongue normal. Mucous membranes moist.  RESPIRATORY: Clear to auscultation without rales, wheezes or rhonchi. CARDIOVASCULAR: Regular rate and rhythm without murmur, rub or gallop. ABDOMEN: Soft, non-tender, with active bowel sounds, and no hepatosplenomegaly. No masses. SKIN: No rashes, ulcers or lesions. EXTREMITIES: No edema, no skin discoloration or tenderness. No palpable cords. LYMPH NODES: No palpable cervical, supraclavicular, axillary or inguinal adenopathy  NEUROLOGICAL: Unremarkable. PSYCH: Appropriate   Appointment on 01/31/2017  Component Date Value Ref Range Status  . Sodium 01/31/2017 134* 135 - 145 mmol/L Final  . Potassium 01/31/2017 4.6  3.5 - 5.1 mmol/L Final  . Chloride 01/31/2017 103  101 - 111 mmol/L Final  . CO2 01/31/2017 23  22 - 32 mmol/L Final  . Glucose, Bld 01/31/2017 118* 65 - 99 mg/dL Final  . BUN 01/31/2017 20  6 - 20 mg/dL Final  . Creatinine, Ser  01/31/2017 0.93  0.61 - 1.24 mg/dL Final  . Calcium 01/31/2017 9.0  8.9 - 10.3 mg/dL Final  . Total Protein 01/31/2017 7.5  6.5 - 8.1 g/dL Final  . Albumin 01/31/2017 3.8  3.5 - 5.0 g/dL Final  . AST 01/31/2017 32  15 - 41 U/L Final  . ALT 01/31/2017 25  17 - 63 U/L Final  . Alkaline Phosphatase 01/31/2017 44  38 - 126 U/L Final  . Total Bilirubin 01/31/2017 0.5  0.3 - 1.2 mg/dL Final  . GFR calc non Af Amer 01/31/2017 >60  >60 mL/min Final  . GFR calc Af Amer 01/31/2017 >60  >60 mL/min Final   Comment: (NOTE) The eGFR has been calculated using the CKD EPI equation. This calculation has not been validated in all clinical situations. eGFR's persistently <60 mL/min signify possible Chronic Kidney Disease.   . Anion gap 01/31/2017 8  5 - 15 Final  . WBC 01/31/2017 3.7* 3.8 - 10.6 K/uL Final  .  RBC 01/31/2017 3.34* 4.40 - 5.90 MIL/uL Final  . Hemoglobin 01/31/2017 12.3* 13.0 - 18.0 g/dL Final  . HCT 01/31/2017 34.9* 40.0 - 52.0 % Final  . MCV 01/31/2017 104.5* 80.0 - 100.0 fL Final  . MCH 01/31/2017 36.6* 26.0 - 34.0 pg Final  . MCHC 01/31/2017 35.1  32.0 - 36.0 g/dL Final  . RDW 01/31/2017 13.7  11.5 - 14.5 % Final  . Platelets 01/31/2017 162  150 - 440 K/uL Final  . Neutrophils Relative % 01/31/2017 62  % Final  . Neutro Abs 01/31/2017 2.4  1.4 - 6.5 K/uL Final  . Lymphocytes Relative 01/31/2017 24  % Final  . Lymphs Abs 01/31/2017 0.9* 1.0 - 3.6 K/uL Final  . Monocytes Relative 01/31/2017 9  % Final  . Monocytes Absolute 01/31/2017 0.3  0.2 - 1.0 K/uL Final  . Eosinophils Relative 01/31/2017 4  % Final  . Eosinophils Absolute 01/31/2017 0.1  0 - 0.7 K/uL Final  . Basophils Relative 01/31/2017 1  % Final  . Basophils Absolute 01/31/2017 0.0  0 - 0.1 K/uL Final    Assessment:  Luis [name]. is a 71 y.o. male with stage III multiple myeloma. He presented in 02/2013 with left-sided sharp pain. Evaluation revealed wide-spread lytic lesions including fracture of the left 5th rib  laterally. CT scans on 02/22/2013 showed innumerable lytic lesions in thoracic spine, sternum, clavicle, scapula, and ribs.   Bone marrow biopsy on 03/13/2013 revealed 15 to 20% plasma cells. Iron stores were absent. Renal function was normal. SPEP on 03/01/2013 revealed a 1.4 gm/dL IgG monoclonal lambda. IgG was 1987.   He began induction with Revlimid 25 mg a day (3 weeks on and 1 week off) and Decadron (40 mg once a week) on 04/09/2013. SPEP has revealed no monoclonal protein since 07/26/2014 (last check 01/31/2017). IgG was 1362 on 07/26/2014 and 1097 on 12/19/2014.   Lambda free light chains have been monitored: 33.20 (ratio of 1.56) on 09/20/2014, 31.51 (ratio of 1.43) on 12/19/2014, 30.25 (ratio of 1.56) on 05/01/2015, 31.95 (ratio 1.49) on 01/01/2016, 28.02 (ratio 1.29) on 03/25/2016, 31.5 (ratio 1.26) on 05/28/2016, 21.8 (ratio 1.35) on 08/20/2016, and 37.3 (ratio 1.09) on 10/22/2016.  24 hour UPEP on 12/02/2015 revealed no monoclonal protein.  With remission, Revlimid was decreased to 10 mg a day then to 10 mg every other day secondary to issues with renal function and diarrhea.  Revlimid was held on 09/17/2016 secondary to neutropenia (ANC 800) then restarted on 10/05/2016.  Bone survey on 05/30/2015 revealed the majority of small lytic lesions were stable.  There was possibly new lesion in the distal left clavicle and right mid femur (small) and a possible developing lucency in the proximal left femoral shaft.  The calvarial lesion noted on the prior study was not well seen (? positional).  Bone survey on 11/27/2015 revealed widespread bony lytic lesions consistent with multiple myeloma.  The vast majority of lesions were stable.  A few small lesions were not previously seen.  One lesion was previously obscured by bowel contents.  Two small lesions in the right distal femoral diaphysis appeared new.  Bone survey on 05/25/2016 revealed no evidence of progressive myelomatous  involvement of the skeleton.  He receives Zometa every 3 months (last 11/26/2016). He takes calcium 750 mg BID. He receives B12 monthly (last 01/03/2017). B12 was 370 on 01/17/2015.  Folate was 17.5 on 09/17/2016.  Abdominal and pelvic CT scan on 07/15/2015 revealed  extensive sigmoid diverticulosis. There was questionable  wall thickening of the ascending colon versus artifact from incomplete distention.  Colonoscopy on 11/11/2015 revealed diverticulosis in the sigmoid colon and distal descending colon and ascending colon.  There was a 2 mm polyp in the mid sigmoid colon.  Pathology revealed a hyperplastic polyp, negative for dysplasia or malignancy.  Symptomatically, he is fatigued secondary to a 3 week history of a URI.  Exam is stable.  Hematocrit is 34.9.  ANC is adequate (2400).  Platelet count is 162,000.  Calcium is normal (9.0) on 4 calcium tablets/day.   Plan: 1.  Labs today:  CBC with diff, CMP, SPEP. 2.  B12 today. 3.  Mallie Snooks, RN to call pt re: Revlimid 4.  RTC in 4 weeks for MD assessment, labs (CBC with diff, CMP, SPEP), and B12.   Lequita Asal, MD  01/31/2017, 10:28 AM

## 2017-02-01 ENCOUNTER — Telehealth: Payer: Self-pay | Admitting: *Deleted

## 2017-02-01 LAB — PROTEIN ELECTROPHORESIS, SERUM
A/G Ratio: 1 (ref 0.7–1.7)
Albumin ELP: 3.5 g/dL (ref 2.9–4.4)
Alpha-1-Globulin: 0.2 g/dL (ref 0.0–0.4)
Alpha-2-Globulin: 0.9 g/dL (ref 0.4–1.0)
Beta Globulin: 1.3 g/dL (ref 0.7–1.3)
Gamma Globulin: 1 g/dL (ref 0.4–1.8)
Globulin, Total: 3.5 g/dL (ref 2.2–3.9)
Total Protein ELP: 7 g/dL (ref 6.0–8.5)

## 2017-02-01 NOTE — Telephone Encounter (Signed)
Has not received Revlimid and is asking if we have heard anything about it. Please return his call

## 2017-02-01 NOTE — Telephone Encounter (Signed)
Called patient and let him know he had to do a patient survey, he will call them.    Leana Roe

## 2017-02-01 NOTE — Telephone Encounter (Signed)
Called patient and discussed to call [facility] that the revlimid was ready to be shipped and that he needed to call [facility] and do a patient survey, patient voiced understanding.

## 2017-02-17 ENCOUNTER — Other Ambulatory Visit: Payer: Self-pay | Admitting: Hematology and Oncology

## 2017-02-22 ENCOUNTER — Telehealth: Payer: Self-pay | Admitting: *Deleted

## 2017-02-22 NOTE — Telephone Encounter (Signed)
done

## 2017-02-22 NOTE — Telephone Encounter (Signed)
Recent Revlimid prescription submitted does not have [employer] Auth # present, Please do survey then call [facility] [phone] ext [phone] with the auth number

## 2017-02-27 ENCOUNTER — Encounter: Payer: Self-pay | Admitting: Hematology and Oncology

## 2017-02-27 NOTE — Progress Notes (Signed)
[facility] day:  02/28/17   Chief Complaint: Luis [name]. is a 71 y.o. male with multiple myeloma who is seen for assessment on Revlimid prior to monthly B12.  HPI:  The patient was last seen in the medical oncology clinic on 01/31/2017.  At that time, he was fatigued.secondary to a 3 week history of cough and "the crud'.  Counts were good.  He had not received his Revlimid.  He received B12.  During the interim, he has done well.  He states that he completed his last 3 week cycle of Revlimid last night. He will now be off Revlimid for a week. He plans to pick up his new prescription and tomorrow.   Symptomatically, he has been tired all week. He has not been outside secondary to the weather and pollen. He states that all he would like to do is sleep. He is over the crud.                                                                                                                                                                                                                       Past Medical History:  Diagnosis Date  . Angina pectoris ([place])   . Anxiety   . Arthritis   . CHF (congestive heart failure) ([place])   . Coronary artery disease   . Depression   . Diabetes mellitus without complication [facility])    Patient takes Metformin.  . Diverticulosis   . GERD (gastroesophageal reflux disease)   . Hyperlipidemia   . Hypertension   . Multiple myeloma ([place])   . Multiple myeloma ([place]) 03/27/2015  . Myocardial infarction [facility]) April 2001   widowmaker  . Shortness of breath dyspnea   . Sleep apnea    No CPAP @ present  . Spinal stenosis     Past Surgical History:  Procedure Laterality Date  . CARDIAC CATHETERIZATION    . CARPAL TUNNEL RELEASE    . CATARACT EXTRACTION    . COLONOSCOPY WITH PROPOFOL N/A 11/11/2015   Procedure: COLONOSCOPY WITH PROPOFOL;  Surgeon: Lollie Sails, MD;  Location: [facility] ENDOSCOPY;  Service: Endoscopy;   Laterality: N/A;  . CORONARY ARTERY BYPASS GRAFT    . EYE SURGERY Bilateral    Cataract Extraction  . INGUINAL HERNIA REPAIR    . JOINT REPLACEMENT Right 2008   Right Total Hip Replacement  . PILONIDAL CYST EXCISION    . ROTATOR CUFF  REPAIR    . TOTAL HIP ARTHROPLASTY Right   . VENTRAL HERNIA REPAIR N/A 08/15/2015   Procedure: VENTRAL HERNIA REPAIR WITH MESH ;  Surgeon: Leonie Green, MD;  Location: [facility] ORS;  Service: General;  Laterality: N/A;    Family History  Problem Relation Age of Onset  . Heart disease Father   . Stroke Mother   . Prostate cancer Maternal Grandfather 50    Social History:  reports that he quit smoking about 17 years ago. His smoking use included Cigarettes. He has a 60.00 pack-year smoking history. He quit smokeless tobacco use about 17 years ago. He reports that he does not drink alcohol or use drugs.  He notes that his wife goes off to work.  He states that his wife would like to retire, but that would affect his medication coverage.  He has projects that he likes to do at home.  He does wood working.  His 42nd high school graduation anniversary was in 06/2016.  He has a new grand-daughter, [name].  The patient is alone today.  Allergies:  Allergies  Allergen Reactions  . Pravachol [Pravastatin]   . Pravastatin Sodium Other (See Comments)  . Statins Other (See Comments)    Muscle aches  . Zocor [Simvastatin] Other (See Comments)    Muscle aches    Current Medications: Current Outpatient Prescriptions  Medication Sig Dispense Refill  . acetaminophen (TYLENOL) 500 MG tablet Take 500 mg by mouth every 6 (six) hours as needed.    . ALPRAZolam (XANAX) 0.25 MG tablet Take 0.25 mg by mouth at bedtime as needed. for sleep  3  . amoxicillin (AMOXIL) 875 MG tablet     . aspirin 81 MG tablet Take 81 mg by mouth daily.     [name] Pollen 500 MG CHEW Chew 1 tablet by mouth daily.    . Calcium Carbonate-Vitamin D 600-400 MG-UNIT chew tablet Chew 4  tablets by mouth daily. Takes them throughout the day for a total of 4    . Cyanocobalamin (VITAMIN B-12 IJ) Inject 1 Dose as directed every 30 (thirty) days.     . ergocalciferol (VITAMIN D2) 50000 UNITS capsule Take 50,000 Units by mouth once a week.    Marland Kitchen lisinopril (PRINIVIL,ZESTRIL) 20 MG tablet Take 1 tablet by mouth daily.    Marland Kitchen lovastatin (MEVACOR) 40 MG tablet Take 20 mg by mouth at bedtime. 2 tablets a day    . metFORMIN (GLUCOPHAGE) 500 MG tablet 500 mg daily with breakfast.     . omeprazole (PRILOSEC) 20 MG capsule Take 20 mg by mouth daily.    . potassium chloride SA (KLOR-CON M20) 20 MEQ tablet TAKE 1 TABLET (20 MEQ TOTAL) BY MOUTH DAILY. 30 tablet 2  . REVLIMID 2.5 MG capsule TAKE 1 CAPSULE (2.5MG) BY MOUTH ONCE DAILY FOR 3 WEEKS, THEN OFF FOR 1WEEK 21 capsule 0  . traMADol (ULTRAM) 50 MG tablet TAKE 1 TABLET BY MOUTH 3 TIMES A DAY AS NEEDED 60 tablet 3  . Zoledronic Acid (ZOMETA) 4 MG/100ML IVPB Inject into the vein.    Marland Kitchen REVLIMID 10 MG capsule TAKE 1 CAPSULE (10MG) BY MOUTH EVERY OTHER DAY (Patient not taking: Reported on 02/28/2017) 14 capsule 1   No current facility-administered medications for this visit.     Review of Systems:  GENERAL:  Fatigue.  No fevers or sweats.  Weight up 4 pounds. PERFORMANCE STATUS (ECOG): 1 HEENT: No visual changes, runny nose, sore throat, mouth sores or tenderness.  Lungs:  No shortness of breath or cough. No hemoptysis. Cardiac: No chest pain, palpitations, orthopnea, or PND.  Pre-op echo and stress test good. GI: No nausea, vomiting, diarrhea, melena or hematochezia. GU: No urgency, frequency, dysuria, or hematuria. Musculoskeletal: Spinal stenosis causes legs aches. Interval steroid injection in back.  No joint pain. No muscle tenderness. Extremities: No pain or swelling. Skin: No rashes or skin changes. Neuro: No headache, numbness or weakness, balance or coordination issues. Injection for spinal stenosis. Endocrine:  Diabetes.  No thyroid issues, hot flashes or night sweats. Psych: No mood changes, depression or anxiety.  Wants to sleep a lot. Pain: No focal pain. Review of systems: All other systems reviewed and found to be negative.  Physical Exam: Blood pressure 129/65, pulse 67, temperature 97.6 F (36.4 C), temperature source Tympanic, resp. rate 18, weight 217 lb 1 oz (98.5 kg).  GENERAL: Well developed, well nourished, elderly gentleman sitting comfortably in the exam room in no acute distress.  He has a cane at his side. MENTAL STATUS: Alert and oriented to person, place and time. HEAD: Short white hair. Male pattern baldness. Normocephalic, atraumatic, face symmetric, no Cushingoid features. EYES: Blue eyes. Pupils equal round and reactive to light and accomodation. No conjunctivitis or scleral icterus. ENT: Hoarse.  Oropharynx clear without lesion. Tongue normal. Mucous membranes moist.  RESPIRATORY: Clear to auscultation without rales, wheezes or rhonchi. CARDIOVASCULAR: Regular rate and rhythm without murmur, rub or gallop. ABDOMEN: Soft, non-tender, with active bowel sounds, and no hepatosplenomegaly. No masses. SKIN: No rashes, ulcers or lesions. EXTREMITIES: No edema, no skin discoloration or tenderness. No palpable cords. LYMPH NODES: No palpable cervical, supraclavicular, axillary or inguinal adenopathy  NEUROLOGICAL: Unremarkable. PSYCH: Appropriate   Appointment on 02/28/2017  Component Date Value Ref Range Status  . WBC 02/28/2017 2.9* 3.8 - 10.6 K/uL Final  . RBC 02/28/2017 3.30* 4.40 - 5.90 MIL/uL Final  . Hemoglobin 02/28/2017 12.2* 13.0 - 18.0 g/dL Final  . HCT 02/28/2017 34.7* 40.0 - 52.0 % Final  . MCV 02/28/2017 104.9* 80.0 - 100.0 fL Final  . MCH 02/28/2017 37.1* 26.0 - 34.0 pg Final  . MCHC 02/28/2017 35.3  32.0 - 36.0 g/dL Final  . RDW 02/28/2017 13.9  11.5 - 14.5 % Final  . Platelets 02/28/2017 176  150 - 440 K/uL Final  . Neutrophils Relative  % 02/28/2017 39  % Final  . Neutro Abs 02/28/2017 1.1* 1.4 - 6.5 K/uL Final  . Lymphocytes Relative 02/28/2017 36  % Final  . Lymphs Abs 02/28/2017 1.0  1.0 - 3.6 K/uL Final  . Monocytes Relative 02/28/2017 15  % Final  . Monocytes Absolute 02/28/2017 0.4  0.2 - 1.0 K/uL Final  . Eosinophils Relative 02/28/2017 8  % Final  . Eosinophils Absolute 02/28/2017 0.2  0 - 0.7 K/uL Final  . Basophils Relative 02/28/2017 2  % Final  . Basophils Absolute 02/28/2017 0.1  0 - 0.1 K/uL Final  . Sodium 02/28/2017 132* 135 - 145 mmol/L Final  . Potassium 02/28/2017 4.4  3.5 - 5.1 mmol/L Final  . Chloride 02/28/2017 103  101 - 111 mmol/L Final  . CO2 02/28/2017 21* 22 - 32 mmol/L Final  . Glucose, Bld 02/28/2017 211* 65 - 99 mg/dL Final  . BUN 02/28/2017 19  6 - 20 mg/dL Final  . Creatinine, Ser 02/28/2017 1.02  0.61 - 1.24 mg/dL Final  . Calcium 02/28/2017 9.1  8.9 - 10.3 mg/dL Final  . Total Protein 02/28/2017 7.8  6.5 -  8.1 g/dL Final  . Albumin 02/28/2017 3.9  3.5 - 5.0 g/dL Final  . AST 02/28/2017 37  15 - 41 U/L Final  . ALT 02/28/2017 28  17 - 63 U/L Final  . Alkaline Phosphatase 02/28/2017 52  38 - 126 U/L Final  . Total Bilirubin 02/28/2017 0.6  0.3 - 1.2 mg/dL Final  . GFR calc non Af Amer 02/28/2017 >60  >60 mL/min Final  . GFR calc Af Amer 02/28/2017 >60  >60 mL/min Final   Comment: (NOTE) The eGFR has been calculated using the CKD EPI equation. This calculation has not been validated in all clinical situations. eGFR's persistently <60 mL/min signify possible Chronic Kidney Disease.   Georgiann Hahn gap 02/28/2017 8  5 - 15 Final    Assessment:  Aran Menning. is a 71 y.o. male with stage III multiple myeloma. He presented in 02/2013 with left-sided sharp pain. Evaluation revealed wide-spread lytic lesions including fracture of the left 5th rib laterally. CT scans on 02/22/2013 showed innumerable lytic lesions in thoracic spine, sternum, clavicle, scapula, and ribs.   Bone marrow  biopsy on 03/13/2013 revealed 15 to 20% plasma cells. Iron stores were absent. Renal function was normal. SPEP on 03/01/2013 revealed a 1.4 gm/dL IgG monoclonal lambda. IgG was 1987.   He began induction with Revlimid 25 mg a day (3 weeks on and 1 week off) and Decadron (40 mg once a week) on 04/09/2013. SPEP has revealed no monoclonal protein since 07/26/2014 (last check 01/31/2017). IgG was 1362 on 07/26/2014 and 1097 on 12/19/2014.   Lambda free light chains have been monitored: 33.20 (ratio of 1.56) on 09/20/2014, 31.51 (ratio of 1.43) on 12/19/2014, 30.25 (ratio of 1.56) on 05/01/2015, 31.95 (ratio 1.49) on 01/01/2016, 28.02 (ratio 1.29) on 03/25/2016, 31.5 (ratio 1.26) on 05/28/2016, 21.8 (ratio 1.35) on 08/20/2016, and 37.3 (ratio 1.09) on 10/22/2016.  24 hour UPEP on 12/02/2015 revealed no monoclonal protein.  With remission, Revlimid was decreased to 10 mg a day then to 10 mg every other day secondary to issues with renal function and diarrhea.  Revlimid was held on 09/17/2016 secondary to neutropenia (ANC 800) then restarted on 10/05/2016.  Bone survey on 05/30/2015 revealed the majority of small lytic lesions were stable.  There was possibly new lesion in the distal left clavicle and right mid femur (small) and a possible developing lucency in the proximal left femoral shaft.  The calvarial lesion noted on the prior study was not well seen (? positional).  Bone survey on 11/27/2015 revealed widespread bony lytic lesions consistent with multiple myeloma.  The vast majority of lesions were stable.  A few small lesions were not previously seen.  One lesion was previously obscured by bowel contents.  Two small lesions in the right distal femoral diaphysis appeared new.  Bone survey on 05/25/2016 revealed no evidence of progressive myelomatous involvement of the skeleton.  He receives Zometa every 3 months (last 11/26/2016). He takes calcium 4 pills/day. He receives B12 monthly (last  01/31/2017). B12 was 370 on 01/17/2015.  Folate was 17.5 on 09/17/2016.  Abdominal and pelvic CT scan on 07/15/2015 revealed  extensive sigmoid diverticulosis. There was questionable wall thickening of the ascending colon versus artifact from incomplete distention.  Colonoscopy on 11/11/2015 revealed diverticulosis in the sigmoid colon and distal descending colon and ascending colon.  There was a 2 mm polyp in the mid sigmoid colon.  Pathology revealed a hyperplastic polyp, negative for dysplasia or malignancy.  Symptomatically, he is fatigued.  Exam is stable.  Hematocrit is 34.7.  ANC is borderline (1100).  Platelet count is 176,000.  Calcium is normal (9.1) on 4 calcium tablets/day.   Plan: 1.  Labs today:  CBC with diff, CMP, SPEP. 2.  Discuss continuation of Zometa every 3 months until next bone survey. 3.  Discuss counts today.  Confirm recovery of counts in 1 week.  Schedule follow-up at beginning of next cycle. 4.  B12 today. 5.  Zometa today. 6.  RTC in 1 week  for CBC with diff. 7.  RTC in 5 weeks for MD assessment, labs (CBC with diff, CMP, SPEP), and B12.   Lequita Asal, MD  02/28/2017, 11:10 AM

## 2017-02-28 ENCOUNTER — Encounter: Payer: Self-pay | Admitting: Hematology and Oncology

## 2017-02-28 ENCOUNTER — Inpatient Hospital Stay: Payer: BLUE CROSS/BLUE SHIELD

## 2017-02-28 ENCOUNTER — Inpatient Hospital Stay: Payer: BLUE CROSS/BLUE SHIELD | Attending: Hematology and Oncology | Admitting: Hematology and Oncology

## 2017-02-28 VITALS — BP 129/65 | HR 67 | Temp 97.6°F | Resp 18 | Wt 217.1 lb

## 2017-02-28 DIAGNOSIS — E785 Hyperlipidemia, unspecified: Secondary | ICD-10-CM

## 2017-02-28 DIAGNOSIS — E119 Type 2 diabetes mellitus without complications: Secondary | ICD-10-CM

## 2017-02-28 DIAGNOSIS — F419 Anxiety disorder, unspecified: Secondary | ICD-10-CM | POA: Diagnosis not present

## 2017-02-28 DIAGNOSIS — R0602 Shortness of breath: Secondary | ICD-10-CM | POA: Diagnosis not present

## 2017-02-28 DIAGNOSIS — Z8042 Family history of malignant neoplasm of prostate: Secondary | ICD-10-CM

## 2017-02-28 DIAGNOSIS — I509 Heart failure, unspecified: Secondary | ICD-10-CM | POA: Diagnosis not present

## 2017-02-28 DIAGNOSIS — I10 Essential (primary) hypertension: Secondary | ICD-10-CM | POA: Diagnosis not present

## 2017-02-28 DIAGNOSIS — K219 Gastro-esophageal reflux disease without esophagitis: Secondary | ICD-10-CM | POA: Diagnosis not present

## 2017-02-28 DIAGNOSIS — K5730 Diverticulosis of large intestine without perforation or abscess without bleeding: Secondary | ICD-10-CM | POA: Diagnosis not present

## 2017-02-28 DIAGNOSIS — C9000 Multiple myeloma not having achieved remission: Secondary | ICD-10-CM

## 2017-02-28 DIAGNOSIS — Z87891 Personal history of nicotine dependence: Secondary | ICD-10-CM

## 2017-02-28 DIAGNOSIS — Z7984 Long term (current) use of oral hypoglycemic drugs: Secondary | ICD-10-CM | POA: Diagnosis not present

## 2017-02-28 DIAGNOSIS — F329 Major depressive disorder, single episode, unspecified: Secondary | ICD-10-CM

## 2017-02-28 DIAGNOSIS — Z7982 Long term (current) use of aspirin: Secondary | ICD-10-CM

## 2017-02-28 DIAGNOSIS — Z86010 Personal history of colonic polyps: Secondary | ICD-10-CM | POA: Diagnosis not present

## 2017-02-28 DIAGNOSIS — Z79899 Other long term (current) drug therapy: Secondary | ICD-10-CM

## 2017-02-28 DIAGNOSIS — R5383 Other fatigue: Secondary | ICD-10-CM

## 2017-02-28 DIAGNOSIS — I25119 Atherosclerotic heart disease of native coronary artery with unspecified angina pectoris: Secondary | ICD-10-CM

## 2017-02-28 DIAGNOSIS — E538 Deficiency of other specified B group vitamins: Secondary | ICD-10-CM

## 2017-02-28 DIAGNOSIS — M4800 Spinal stenosis, site unspecified: Secondary | ICD-10-CM

## 2017-02-28 DIAGNOSIS — M129 Arthropathy, unspecified: Secondary | ICD-10-CM | POA: Diagnosis not present

## 2017-02-28 DIAGNOSIS — I252 Old myocardial infarction: Secondary | ICD-10-CM

## 2017-02-28 DIAGNOSIS — G4730 Sleep apnea, unspecified: Secondary | ICD-10-CM

## 2017-02-28 LAB — CBC WITH DIFFERENTIAL/PLATELET
Basophils Absolute: 0.1 10*3/uL (ref 0–0.1)
Basophils Relative: 2 %
Eosinophils Absolute: 0.2 10*3/uL (ref 0–0.7)
Eosinophils Relative: 8 %
HCT: 34.7 % — ABNORMAL LOW (ref 40.0–52.0)
Hemoglobin: 12.2 g/dL — ABNORMAL LOW (ref 13.0–18.0)
Lymphocytes Relative: 36 %
Lymphs Abs: 1 10*3/uL (ref 1.0–3.6)
MCH: 37.1 pg — ABNORMAL HIGH (ref 26.0–34.0)
MCHC: 35.3 g/dL (ref 32.0–36.0)
MCV: 104.9 fL — ABNORMAL HIGH (ref 80.0–100.0)
Monocytes Absolute: 0.4 10*3/uL (ref 0.2–1.0)
Monocytes Relative: 15 %
Neutro Abs: 1.1 10*3/uL — ABNORMAL LOW (ref 1.4–6.5)
Neutrophils Relative %: 39 %
Platelets: 176 10*3/uL (ref 150–440)
RBC: 3.3 MIL/uL — ABNORMAL LOW (ref 4.40–5.90)
RDW: 13.9 % (ref 11.5–14.5)
WBC: 2.9 10*3/uL — ABNORMAL LOW (ref 3.8–10.6)

## 2017-02-28 LAB — COMPREHENSIVE METABOLIC PANEL
ALT: 28 U/L (ref 17–63)
AST: 37 U/L (ref 15–41)
Albumin: 3.9 g/dL (ref 3.5–5.0)
Alkaline Phosphatase: 52 U/L (ref 38–126)
Anion gap: 8 (ref 5–15)
BUN: 19 mg/dL (ref 6–20)
CO2: 21 mmol/L — ABNORMAL LOW (ref 22–32)
Calcium: 9.1 mg/dL (ref 8.9–10.3)
Chloride: 103 mmol/L (ref 101–111)
Creatinine, Ser: 1.02 mg/dL (ref 0.61–1.24)
GFR calc Af Amer: >60 mL/min (ref >60)
GFR calc non Af Amer: >60 mL/min (ref >60)
Glucose, Bld: 211 mg/dL — ABNORMAL HIGH (ref 65–99)
Potassium: 4.4 mmol/L (ref 3.5–5.1)
Sodium: 132 mmol/L — ABNORMAL LOW (ref 135–145)
Total Bilirubin: 0.6 mg/dL (ref 0.3–1.2)
Total Protein: 7.8 g/dL (ref 6.5–8.1)

## 2017-02-28 MED ORDER — ZOLEDRONIC ACID 4 MG/100ML IV SOLN
4.0000 mg | Freq: Once | INTRAVENOUS | Status: AC
  Administered 2017-02-28: 4 mg via INTRAVENOUS
  Filled 2017-02-28: qty 100

## 2017-02-28 MED ORDER — CYANOCOBALAMIN 1000 MCG/ML IJ SOLN
1000.0000 ug | Freq: Once | INTRAMUSCULAR | Status: AC
  Administered 2017-02-28: 1000 ug via INTRAMUSCULAR
  Filled 2017-02-28: qty 1

## 2017-02-28 MED ORDER — SODIUM CHLORIDE 0.9 % IV SOLN
Freq: Once | INTRAVENOUS | Status: AC
Start: 2017-02-28 — End: 2017-02-28
  Administered 2017-02-28: 12:00:00 via INTRAVENOUS
  Filled 2017-02-28: qty 1000

## 2017-02-28 NOTE — Progress Notes (Signed)
Patient offers no complaints today.  Patient states he took his last Revlimid last night.  He is now on his 7 day break.

## 2017-03-01 LAB — PROTEIN ELECTROPHORESIS, SERUM
A/G Ratio: 1.2 (ref 0.7–1.7)
Albumin ELP: 3.7 g/dL (ref 2.9–4.4)
Alpha-1-Globulin: 0.2 g/dL (ref 0.0–0.4)
Alpha-2-Globulin: 0.8 g/dL (ref 0.4–1.0)
Beta Globulin: 1.3 g/dL (ref 0.7–1.3)
Gamma Globulin: 1 g/dL (ref 0.4–1.8)
Globulin, Total: 3.2 g/dL (ref 2.2–3.9)
Total Protein ELP: 6.9 g/dL (ref 6.0–8.5)

## 2017-03-07 ENCOUNTER — Inpatient Hospital Stay: Payer: BLUE CROSS/BLUE SHIELD

## 2017-03-07 ENCOUNTER — Telehealth: Payer: Self-pay | Admitting: *Deleted

## 2017-03-07 DIAGNOSIS — C9000 Multiple myeloma not having achieved remission: Secondary | ICD-10-CM | POA: Diagnosis not present

## 2017-03-07 LAB — CBC WITH DIFFERENTIAL/PLATELET
Basophils Absolute: 0.1 10*3/uL (ref 0–0.1)
Basophils Relative: 3 %
Eosinophils Absolute: 0.1 10*3/uL (ref 0–0.7)
Eosinophils Relative: 4 %
HCT: 35.4 % — ABNORMAL LOW (ref 40.0–52.0)
Hemoglobin: 12.5 g/dL — ABNORMAL LOW (ref 13.0–18.0)
Lymphocytes Relative: 33 %
Lymphs Abs: 1.2 10*3/uL (ref 1.0–3.6)
MCH: 37 pg — ABNORMAL HIGH (ref 26.0–34.0)
MCHC: 35.3 g/dL (ref 32.0–36.0)
MCV: 104.8 fL — ABNORMAL HIGH (ref 80.0–100.0)
Monocytes Absolute: 0.6 10*3/uL (ref 0.2–1.0)
Monocytes Relative: 16 %
Neutro Abs: 1.6 10*3/uL (ref 1.4–6.5)
Neutrophils Relative %: 44 %
Platelets: 207 10*3/uL (ref 150–440)
RBC: 3.38 MIL/uL — ABNORMAL LOW (ref 4.40–5.90)
RDW: 14 % (ref 11.5–14.5)
WBC: 3.6 10*3/uL — ABNORMAL LOW (ref 3.8–10.6)

## 2017-03-07 NOTE — Telephone Encounter (Signed)
Called patient to inform him that his counts are all good.  Patient asked when is he to restart his Revlimid?  States he was told MD would let him know.

## 2017-03-07 NOTE — Telephone Encounter (Signed)
Called patient to tell him to start his revlimid tomorrow, May 1.  Verbalized understanding.

## 2017-03-07 NOTE — Telephone Encounter (Signed)
-----   Message from Lequita Asal, MD sent at 03/07/2017  3:10 PM EDT ----- Regarding: Please call patient  Counts good.  M  ----- Message ----- From: Interface, Lab In [place] Sent: 03/07/2017   9:54 AM To: Lequita Asal, MD

## 2017-03-16 ENCOUNTER — Other Ambulatory Visit: Payer: Self-pay | Admitting: Hematology and Oncology

## 2017-03-17 ENCOUNTER — Telehealth: Payer: Self-pay | Admitting: *Deleted

## 2017-03-17 NOTE — Telephone Encounter (Signed)
No authorization number on the Revlimid prescription they received. Please resubmit after the auth number has been obtained

## 2017-03-17 NOTE — Telephone Encounter (Signed)
[facility] notified of [facility] # [id number]

## 2017-04-04 NOTE — Progress Notes (Signed)
[facility] day:  04/05/17   Chief Complaint: Luis [name]. is a 71 y.o. male with multiple myeloma who is seen for assessment on Revlimid prior to monthly B12.  HPI:  The patient was last seen in the medical oncology clinic on 02/28/2017.  At that time, he was fatigued.  Exam was stable.  Hematocrit was 34.7.  ANC was borderline (1100).  Platelet count was 176,000.  Calcium was normal (9.1) on 4 calcium tablets/day.  SPEP revealed no monoclonal protein.  He began his next cycle of Revlimid on 03/07/2017.  ANC was 1600.  Symptomatically, he feels "slow" today.  He notes hip and abdominal pain due to spinal stenosis.  He comments that he gets shots every 3-4 months.  He believes it is time for one.  He states that he is trying to lose weight.  He is cutting back on sweets and eating less.                                                                                                                                                                                                                       Past Medical History:  Diagnosis Date  . Angina pectoris ([place])   . Anxiety   . Arthritis   . CHF (congestive heart failure) ([place])   . Coronary artery disease   . Depression   . Diabetes mellitus without complication [facility])    Patient takes Metformin.  . Diverticulosis   . GERD (gastroesophageal reflux disease)   . Hyperlipidemia   . Hypertension   . Multiple myeloma ([place])   . Multiple myeloma ([place]) 03/27/2015  . Myocardial infarction [facility]) April 2001   widowmaker  . Shortness of breath dyspnea   . Sleep apnea    No CPAP @ present  . Spinal stenosis     Past Surgical History:  Procedure Laterality Date  . CARDIAC CATHETERIZATION    . CARPAL TUNNEL RELEASE    . CATARACT EXTRACTION    . COLONOSCOPY WITH PROPOFOL N/A 11/11/2015   Procedure: COLONOSCOPY WITH PROPOFOL;  Surgeon: Lollie Sails, MD;  Location: [facility] ENDOSCOPY;  Service:  Endoscopy;  Laterality: N/A;  . CORONARY ARTERY BYPASS GRAFT    . EYE SURGERY Bilateral    Cataract Extraction  . INGUINAL HERNIA REPAIR    . JOINT REPLACEMENT Right 2008   Right Total Hip Replacement  . PILONIDAL CYST EXCISION    . ROTATOR CUFF REPAIR    .  TOTAL HIP ARTHROPLASTY Right   . VENTRAL HERNIA REPAIR N/A 08/15/2015   Procedure: VENTRAL HERNIA REPAIR WITH MESH ;  Surgeon: Leonie Green, MD;  Location: [facility] ORS;  Service: General;  Laterality: N/A;    Family History  Problem Relation Age of Onset  . Heart disease Father   . Stroke Mother   . Prostate cancer Maternal Grandfather 54    Social History:  reports that he quit smoking about 17 years ago. His smoking use included Cigarettes. He has a 60.00 pack-year smoking history. He quit smokeless tobacco use about 17 years ago. He reports that he does not drink alcohol or use drugs.  He notes that his wife goes off to work.  He states that his wife would like to retire, but that would affect his medication coverage.  He has projects that he likes to do at home.  He does wood working.  His 42nd high school graduation anniversary was in 06/2016.  He has a new grand-daughter, [name].  The patient is alone today.  Allergies:  Allergies  Allergen Reactions  . Pravachol [Pravastatin]   . Pravastatin Sodium Other (See Comments)  . Statins Other (See Comments)    Muscle aches  . Zocor [Simvastatin] Other (See Comments)    Muscle aches    Current Medications: Current Outpatient Prescriptions  Medication Sig Dispense Refill  . acetaminophen (TYLENOL) 500 MG tablet Take 500 mg by mouth every 6 (six) hours as needed.    . ALPRAZolam (XANAX) 0.25 MG tablet Take 0.25 mg by mouth at bedtime as needed. for sleep  3  . amoxicillin (AMOXIL) 875 MG tablet     . aspirin 81 MG tablet Take 81 mg by mouth daily.     [name] Pollen 500 MG CHEW Chew 1 tablet by mouth daily.    . Calcium Carbonate-Vitamin D 600-400 MG-UNIT chew tablet  Chew 4 tablets by mouth daily. Takes them throughout the day for a total of 4    . Cyanocobalamin (VITAMIN B-12 IJ) Inject 1 Dose as directed every 30 (thirty) days.     . ergocalciferol (VITAMIN D2) 50000 UNITS capsule Take 50,000 Units by mouth once a week.    Marland Kitchen lisinopril (PRINIVIL,ZESTRIL) 20 MG tablet Take 1 tablet by mouth daily.    Marland Kitchen lovastatin (MEVACOR) 40 MG tablet Take 20 mg by mouth at bedtime. 2 tablets a day    . metFORMIN (GLUCOPHAGE) 500 MG tablet 500 mg daily with breakfast.     . omeprazole (PRILOSEC) 20 MG capsule Take 20 mg by mouth daily.    . potassium chloride SA (KLOR-CON M20) 20 MEQ tablet TAKE 1 TABLET (20 MEQ TOTAL) BY MOUTH DAILY. 30 tablet 2  . REVLIMID 10 MG capsule TAKE 1 CAPSULE (10MG) BY MOUTH EVERY OTHER DAY 14 capsule 1  . REVLIMID 2.5 MG capsule TAKE 1 CAPSULE (2.5MG) BY MOUTH ONCE DAILY FOR 21 DAYS, THEN OFF FOR 7DAYS 21 capsule 1  . traMADol (ULTRAM) 50 MG tablet TAKE 1 TABLET BY MOUTH 3 TIMES A DAY AS NEEDED 60 tablet 3  . Zoledronic Acid (ZOMETA) 4 MG/100ML IVPB Inject into the vein.     No current facility-administered medications for this visit.     Review of Systems:  GENERAL:  Feels "slow" today.  No fevers or sweats.  Weight down 5 pounds. PERFORMANCE STATUS (ECOG): 1 HEENT: No visual changes, runny nose, sore throat, mouth sores or tenderness. Lungs:  No shortness of breath or cough. No  hemoptysis. Cardiac: No chest pain, palpitations, orthopnea, or PND.  Pre-op echo and stress test good. GI: Abdominal discomfort (see HPI).  No nausea, vomiting, diarrhea, melena or hematochezia. GU: No urgency, frequency, dysuria, or hematuria. Musculoskeletal: Spinal stenosis causes legs aches and hip pain.Awaiting steroid injection.  No joint pain. No muscle tenderness. Extremities: No pain or swelling. Skin: No rashes or skin changes. Neuro: No headache, numbness or weakness, balance or coordination issues. Injection for spinal  stenosis. Endocrine: Diabetes.  No thyroid issues, hot flashes or night sweats. Psych: No mood changes, depression or anxiety.  Wants to sleep a lot. Pain: No focal pain. Review of systems: All other systems reviewed and found to be negative.  Physical Exam: Blood pressure (!) 145/64, pulse (!) 57, temperature 98.4 F (36.9 C), temperature source Tympanic, resp. rate 18, weight 212 lb 8 oz (96.4 kg).  GENERAL: Well developed, well nourished, elderly gentleman sitting comfortably in the exam room in no acute distress.  He has a cane at his side. MENTAL STATUS: Alert and oriented to person, place and time. HEAD: Short white hair. Male pattern baldness. Normocephalic, atraumatic, face symmetric, no Cushingoid features. EYES: Blue eyes. Pupils equal round and reactive to light and accomodation. No conjunctivitis or scleral icterus. ENT: Oropharynx clear without lesion. Tongue normal. Mucous membranes moist.  RESPIRATORY: Clear to auscultation without rales, wheezes or rhonchi. CARDIOVASCULAR: Regular rate and rhythm without murmur, rub or gallop. ABDOMEN: Soft, non-tender, with active bowel sounds, and no hepatosplenomegaly. No masses. SKIN: No rashes, ulcers or lesions. EXTREMITIES: No edema, no skin discoloration or tenderness. No palpable cords. LYMPH NODES: No palpable cervical, supraclavicular, axillary or inguinal adenopathy  NEUROLOGICAL: Unremarkable. PSYCH: Appropriate   Appointment on 04/05/2017  Component Date Value Ref Range Status  . Sodium 04/05/2017 135  135 - 145 mmol/L Final  . Potassium 04/05/2017 5.1  3.5 - 5.1 mmol/L Final  . Chloride 04/05/2017 103  101 - 111 mmol/L Final  . CO2 04/05/2017 26  22 - 32 mmol/L Final  . Glucose, Bld 04/05/2017 120* 65 - 99 mg/dL Final  . BUN 04/05/2017 18  6 - 20 mg/dL Final  . Creatinine, Ser 04/05/2017 0.98  0.61 - 1.24 mg/dL Final  . Calcium 04/05/2017 9.4  8.9 - 10.3 mg/dL Final  . Total Protein 04/05/2017  8.3* 6.5 - 8.1 g/dL Final  . Albumin 04/05/2017 4.2  3.5 - 5.0 g/dL Final  . AST 04/05/2017 35  15 - 41 U/L Final  . ALT 04/05/2017 23  17 - 63 U/L Final  . Alkaline Phosphatase 04/05/2017 42  38 - 126 U/L Final  . Total Bilirubin 04/05/2017 0.7  0.3 - 1.2 mg/dL Final  . GFR calc non Af Amer 04/05/2017 >60  >60 mL/min Final  . GFR calc Af Amer 04/05/2017 >60  >60 mL/min Final   Comment: (NOTE) The eGFR has been calculated using the CKD EPI equation. This calculation has not been validated in all clinical situations. eGFR's persistently <60 mL/min signify possible Chronic Kidney Disease.   . Anion gap 04/05/2017 6  5 - 15 Final  . WBC 04/05/2017 2.7* 3.8 - 10.6 K/uL Final  . RBC 04/05/2017 3.33* 4.40 - 5.90 MIL/uL Final  . Hemoglobin 04/05/2017 12.5* 13.0 - 18.0 g/dL Final  . HCT 04/05/2017 34.8* 40.0 - 52.0 % Final  . MCV 04/05/2017 104.7* 80.0 - 100.0 fL Final  . MCH 04/05/2017 37.6* 26.0 - 34.0 pg Final  . MCHC 04/05/2017 35.9  32.0 - 36.0 g/dL Final  .  RDW 04/05/2017 13.1  11.5 - 14.5 % Final  . Platelets 04/05/2017 158  150 - 440 K/uL Final  . Neutrophils Relative % 04/05/2017 40  % Final  . Neutro Abs 04/05/2017 1.1* 1.4 - 6.5 K/uL Final  . Lymphocytes Relative 04/05/2017 37  % Final  . Lymphs Abs 04/05/2017 1.0  1.0 - 3.6 K/uL Final  . Monocytes Relative 04/05/2017 14  % Final  . Monocytes Absolute 04/05/2017 0.4  0.2 - 1.0 K/uL Final  . Eosinophils Relative 04/05/2017 6  % Final  . Eosinophils Absolute 04/05/2017 0.2  0 - 0.7 K/uL Final  . Basophils Relative 04/05/2017 3  % Final  . Basophils Absolute 04/05/2017 0.1  0 - 0.1 K/uL Final    Assessment:  Luis [name]. is a 71 y.o. male with stage III multiple myeloma. He presented in 02/2013 with left-sided sharp pain. Evaluation revealed wide-spread lytic lesions including fracture of the left 5th rib laterally. CT scans on 02/22/2013 showed innumerable lytic lesions in thoracic spine, sternum, clavicle, scapula, and  ribs.   Bone marrow biopsy on 03/13/2013 revealed 15 to 20% plasma cells. Iron stores were absent. Renal function was normal. SPEP on 03/01/2013 revealed a 1.4 gm/dL IgG monoclonal lambda. IgG was 1987.   He began induction with Revlimid 25 mg a day (3 weeks on and 1 week off) and Decadron (40 mg once a week) on 04/09/2013. SPEP has revealed no monoclonal protein since 07/26/2014 (last check 02/28/2017). IgG was 1362 on 07/26/2014 and 1097 on 12/19/2014.   Lambda free light chains have been monitored: 33.20 (ratio of 1.56) on 09/20/2014, 31.51 (ratio of 1.43) on 12/19/2014, 30.25 (ratio of 1.56) on 05/01/2015, 31.95 (ratio 1.49) on 01/01/2016, 28.02 (ratio 1.29) on 03/25/2016, 31.5 (ratio 1.26) on 05/28/2016, 21.8 (ratio 1.35) on 08/20/2016, and 37.3 (ratio 1.09) on 10/22/2016.  24 hour UPEP on 12/02/2015 revealed no monoclonal protein.  With remission, Revlimid was decreased to 10 mg a day then to 10 mg every other day secondary to issues with renal function and diarrhea.  Revlimid was held on 09/17/2016 secondary to neutropenia (ANC 800) then restarted on 10/05/2016.  Bone survey on 05/30/2015 revealed the majority of small lytic lesions were stable.  There was possibly new lesion in the distal left clavicle and right mid femur (small) and a possible developing lucency in the proximal left femoral shaft.  The calvarial lesion noted on the prior study was not well seen (? positional).  Bone survey on 11/27/2015 revealed widespread bony lytic lesions consistent with multiple myeloma.  The vast majority of lesions were stable.  A few small lesions were not previously seen.  One lesion was previously obscured by bowel contents.  Two small lesions in the right distal femoral diaphysis appeared new.  Bone survey on 05/25/2016 revealed no evidence of progressive myelomatous involvement of the skeleton.  He receives Zometa every 3 months (last 02/28/2017). He takes calcium 4 pills/day. He receives  B12 monthly (last 02/28/2017). B12 was 370 on 01/17/2015.  Folate was 17.5 on 09/17/2016.  Abdominal and pelvic CT scan on 07/15/2015 revealed  extensive sigmoid diverticulosis. There was questionable wall thickening of the ascending colon versus artifact from incomplete distention.  Colonoscopy on 11/11/2015 revealed diverticulosis in the sigmoid colon and distal descending colon and ascending colon.  There was a 2 mm polyp in the mid sigmoid colon.  Pathology revealed a hyperplastic polyp, negative for dysplasia or malignancy.  Symptomatically, he has symptoms associated with his spinal stenosis.  Exam is stable.  Hematocrit is 34.8.  ANC is borderline (1100).  Platelet count is 158,000.  Calcium is normal (9.4) on 4 calcium tablets/day.   Plan: 1.  Labs today:  CBC with diff, CMP, SPEP. 2.  Discuss plan to start next Revlimid cycle in 1 week (04/12/2017) if counts improved. 3.  B12 today. 4.  Bone survey 05/16/2017. 5.  RTC in 1 week for CBC with diff. 6.  RTC on 05/17/2017 for MD assessment, labs (CBC with diff, CMP, SPEP), and B12   Lequita Asal, MD  04/05/2017, 11:33 AM

## 2017-04-05 ENCOUNTER — Inpatient Hospital Stay: Payer: BLUE CROSS/BLUE SHIELD

## 2017-04-05 ENCOUNTER — Inpatient Hospital Stay: Payer: BLUE CROSS/BLUE SHIELD | Attending: Hematology and Oncology

## 2017-04-05 ENCOUNTER — Inpatient Hospital Stay (HOSPITAL_BASED_OUTPATIENT_CLINIC_OR_DEPARTMENT_OTHER): Payer: BLUE CROSS/BLUE SHIELD | Admitting: Hematology and Oncology

## 2017-04-05 VITALS — BP 145/64 | HR 57 | Temp 98.4°F | Resp 18 | Wt 212.5 lb

## 2017-04-05 DIAGNOSIS — C9000 Multiple myeloma not having achieved remission: Secondary | ICD-10-CM

## 2017-04-05 DIAGNOSIS — Z86010 Personal history of colonic polyps: Secondary | ICD-10-CM

## 2017-04-05 DIAGNOSIS — Z79899 Other long term (current) drug therapy: Secondary | ICD-10-CM

## 2017-04-05 DIAGNOSIS — K5730 Diverticulosis of large intestine without perforation or abscess without bleeding: Secondary | ICD-10-CM | POA: Diagnosis not present

## 2017-04-05 DIAGNOSIS — Z7984 Long term (current) use of oral hypoglycemic drugs: Secondary | ICD-10-CM | POA: Insufficient documentation

## 2017-04-05 DIAGNOSIS — Z87891 Personal history of nicotine dependence: Secondary | ICD-10-CM | POA: Diagnosis not present

## 2017-04-05 DIAGNOSIS — D709 Neutropenia, unspecified: Secondary | ICD-10-CM | POA: Diagnosis not present

## 2017-04-05 DIAGNOSIS — Z7982 Long term (current) use of aspirin: Secondary | ICD-10-CM | POA: Diagnosis not present

## 2017-04-05 DIAGNOSIS — Z8042 Family history of malignant neoplasm of prostate: Secondary | ICD-10-CM

## 2017-04-05 DIAGNOSIS — I10 Essential (primary) hypertension: Secondary | ICD-10-CM | POA: Insufficient documentation

## 2017-04-05 DIAGNOSIS — F419 Anxiety disorder, unspecified: Secondary | ICD-10-CM

## 2017-04-05 DIAGNOSIS — I509 Heart failure, unspecified: Secondary | ICD-10-CM

## 2017-04-05 DIAGNOSIS — Z8719 Personal history of other diseases of the digestive system: Secondary | ICD-10-CM | POA: Insufficient documentation

## 2017-04-05 DIAGNOSIS — I2510 Atherosclerotic heart disease of native coronary artery without angina pectoris: Secondary | ICD-10-CM | POA: Diagnosis not present

## 2017-04-05 DIAGNOSIS — M4800 Spinal stenosis, site unspecified: Secondary | ICD-10-CM | POA: Insufficient documentation

## 2017-04-05 DIAGNOSIS — R0602 Shortness of breath: Secondary | ICD-10-CM | POA: Diagnosis not present

## 2017-04-05 DIAGNOSIS — F329 Major depressive disorder, single episode, unspecified: Secondary | ICD-10-CM | POA: Diagnosis not present

## 2017-04-05 DIAGNOSIS — E538 Deficiency of other specified B group vitamins: Secondary | ICD-10-CM | POA: Diagnosis not present

## 2017-04-05 DIAGNOSIS — I252 Old myocardial infarction: Secondary | ICD-10-CM

## 2017-04-05 DIAGNOSIS — K219 Gastro-esophageal reflux disease without esophagitis: Secondary | ICD-10-CM

## 2017-04-05 DIAGNOSIS — E119 Type 2 diabetes mellitus without complications: Secondary | ICD-10-CM | POA: Diagnosis not present

## 2017-04-05 DIAGNOSIS — R5383 Other fatigue: Secondary | ICD-10-CM

## 2017-04-05 DIAGNOSIS — E785 Hyperlipidemia, unspecified: Secondary | ICD-10-CM

## 2017-04-05 DIAGNOSIS — I25119 Atherosclerotic heart disease of native coronary artery with unspecified angina pectoris: Secondary | ICD-10-CM | POA: Insufficient documentation

## 2017-04-05 LAB — CBC WITH DIFFERENTIAL/PLATELET
Basophils Absolute: 0.1 10*3/uL (ref 0–0.1)
Basophils Relative: 3 %
Eosinophils Absolute: 0.2 10*3/uL (ref 0–0.7)
Eosinophils Relative: 6 %
HCT: 34.8 % — ABNORMAL LOW (ref 40.0–52.0)
Hemoglobin: 12.5 g/dL — ABNORMAL LOW (ref 13.0–18.0)
Lymphocytes Relative: 37 %
Lymphs Abs: 1 10*3/uL (ref 1.0–3.6)
MCH: 37.6 pg — ABNORMAL HIGH (ref 26.0–34.0)
MCHC: 35.9 g/dL (ref 32.0–36.0)
MCV: 104.7 fL — ABNORMAL HIGH (ref 80.0–100.0)
Monocytes Absolute: 0.4 10*3/uL (ref 0.2–1.0)
Monocytes Relative: 14 %
Neutro Abs: 1.1 10*3/uL — ABNORMAL LOW (ref 1.4–6.5)
Neutrophils Relative %: 40 %
Platelets: 158 10*3/uL (ref 150–440)
RBC: 3.33 MIL/uL — ABNORMAL LOW (ref 4.40–5.90)
RDW: 13.1 % (ref 11.5–14.5)
WBC: 2.7 10*3/uL — ABNORMAL LOW (ref 3.8–10.6)

## 2017-04-05 LAB — COMPREHENSIVE METABOLIC PANEL
ALT: 23 U/L (ref 17–63)
AST: 35 U/L (ref 15–41)
Albumin: 4.2 g/dL (ref 3.5–5.0)
Alkaline Phosphatase: 42 U/L (ref 38–126)
Anion gap: 6 (ref 5–15)
BUN: 18 mg/dL (ref 6–20)
CO2: 26 mmol/L (ref 22–32)
Calcium: 9.4 mg/dL (ref 8.9–10.3)
Chloride: 103 mmol/L (ref 101–111)
Creatinine, Ser: 0.98 mg/dL (ref 0.61–1.24)
GFR calc Af Amer: >60 mL/min (ref >60)
GFR calc non Af Amer: >60 mL/min (ref >60)
Glucose, Bld: 120 mg/dL — ABNORMAL HIGH (ref 65–99)
Potassium: 5.1 mmol/L (ref 3.5–5.1)
Sodium: 135 mmol/L (ref 135–145)
Total Bilirubin: 0.7 mg/dL (ref 0.3–1.2)
Total Protein: 8.3 g/dL — ABNORMAL HIGH (ref 6.5–8.1)

## 2017-04-05 MED ORDER — CYANOCOBALAMIN 1000 MCG/ML IJ SOLN
1000.0000 ug | Freq: Once | INTRAMUSCULAR | Status: AC
  Administered 2017-04-05: 1000 ug via INTRAMUSCULAR
  Filled 2017-04-05: qty 1

## 2017-04-05 NOTE — Progress Notes (Signed)
Patient is having left hip pain that is intermittent.  Having some consttpation as well.  States he is supposed to start his Revlimid again today.

## 2017-04-06 LAB — PROTEIN ELECTROPHORESIS, SERUM
A/G Ratio: 0.9 (ref 0.7–1.7)
Albumin ELP: 3.6 g/dL (ref 2.9–4.4)
Alpha-1-Globulin: 0.2 g/dL (ref 0.0–0.4)
Alpha-2-Globulin: 0.9 g/dL (ref 0.4–1.0)
Beta Globulin: 1.5 g/dL — ABNORMAL HIGH (ref 0.7–1.3)
Gamma Globulin: 1.1 g/dL (ref 0.4–1.8)
Globulin, Total: 3.8 g/dL (ref 2.2–3.9)
Total Protein ELP: 7.4 g/dL (ref 6.0–8.5)

## 2017-04-12 ENCOUNTER — Inpatient Hospital Stay: Payer: BLUE CROSS/BLUE SHIELD | Attending: Hematology and Oncology

## 2017-04-12 DIAGNOSIS — E538 Deficiency of other specified B group vitamins: Secondary | ICD-10-CM

## 2017-04-12 DIAGNOSIS — C9000 Multiple myeloma not having achieved remission: Secondary | ICD-10-CM | POA: Insufficient documentation

## 2017-04-12 LAB — CBC WITH DIFFERENTIAL/PLATELET
Basophils Absolute: 0.1 10*3/uL (ref 0–0.1)
Basophils Relative: 2 %
Eosinophils Absolute: 0.1 10*3/uL (ref 0–0.7)
Eosinophils Relative: 4 %
HCT: 34.9 % — ABNORMAL LOW (ref 40.0–52.0)
Hemoglobin: 12.3 g/dL — ABNORMAL LOW (ref 13.0–18.0)
Lymphocytes Relative: 28 %
Lymphs Abs: 1 10*3/uL (ref 1.0–3.6)
MCH: 37.4 pg — ABNORMAL HIGH (ref 26.0–34.0)
MCHC: 35.2 g/dL (ref 32.0–36.0)
MCV: 106.3 fL — ABNORMAL HIGH (ref 80.0–100.0)
Monocytes Absolute: 0.6 10*3/uL (ref 0.2–1.0)
Monocytes Relative: 17 %
Neutro Abs: 1.7 10*3/uL (ref 1.4–6.5)
Neutrophils Relative %: 49 %
Platelets: 137 10*3/uL — ABNORMAL LOW (ref 150–440)
RBC: 3.29 MIL/uL — ABNORMAL LOW (ref 4.40–5.90)
RDW: 12.9 % (ref 11.5–14.5)
WBC: 3.5 10*3/uL — ABNORMAL LOW (ref 3.8–10.6)

## 2017-04-14 ENCOUNTER — Other Ambulatory Visit: Payer: Self-pay | Admitting: Hematology and Oncology

## 2017-04-24 ENCOUNTER — Other Ambulatory Visit: Payer: Self-pay | Admitting: Hematology and Oncology

## 2017-04-30 ENCOUNTER — Other Ambulatory Visit: Payer: Self-pay | Admitting: Hematology and Oncology

## 2017-04-30 DIAGNOSIS — C9000 Multiple myeloma not having achieved remission: Secondary | ICD-10-CM

## 2017-05-02 NOTE — Addendum Note (Signed)
Addended by: Betti Cruz on: 05/02/2017 10:08 AM   Modules accepted: Orders

## 2017-05-02 NOTE — Telephone Encounter (Signed)
Per VO Dr Grayland Ormond, hold potassium and recheck lab in 1 week. Patient agrees to come in 7/2 for lab only appt

## 2017-05-02 NOTE — Telephone Encounter (Signed)
Dx:  Multiple myeloma not having achieved ...   Ref Range & Units 3wk ago  Potassium 3.5 - 5.1 mmol/L 5.1

## 2017-05-09 ENCOUNTER — Inpatient Hospital Stay: Payer: BLUE CROSS/BLUE SHIELD | Attending: Hematology and Oncology

## 2017-05-09 DIAGNOSIS — M545 Low back pain: Secondary | ICD-10-CM | POA: Diagnosis not present

## 2017-05-09 DIAGNOSIS — Z7984 Long term (current) use of oral hypoglycemic drugs: Secondary | ICD-10-CM | POA: Insufficient documentation

## 2017-05-09 DIAGNOSIS — Z7982 Long term (current) use of aspirin: Secondary | ICD-10-CM | POA: Diagnosis not present

## 2017-05-09 DIAGNOSIS — D709 Neutropenia, unspecified: Secondary | ICD-10-CM | POA: Diagnosis not present

## 2017-05-09 DIAGNOSIS — E119 Type 2 diabetes mellitus without complications: Secondary | ICD-10-CM | POA: Diagnosis not present

## 2017-05-09 DIAGNOSIS — G4730 Sleep apnea, unspecified: Secondary | ICD-10-CM | POA: Insufficient documentation

## 2017-05-09 DIAGNOSIS — I10 Essential (primary) hypertension: Secondary | ICD-10-CM | POA: Diagnosis not present

## 2017-05-09 DIAGNOSIS — R0602 Shortness of breath: Secondary | ICD-10-CM | POA: Diagnosis not present

## 2017-05-09 DIAGNOSIS — I252 Old myocardial infarction: Secondary | ICD-10-CM | POA: Insufficient documentation

## 2017-05-09 DIAGNOSIS — E538 Deficiency of other specified B group vitamins: Secondary | ICD-10-CM | POA: Insufficient documentation

## 2017-05-09 DIAGNOSIS — I25119 Atherosclerotic heart disease of native coronary artery with unspecified angina pectoris: Secondary | ICD-10-CM | POA: Insufficient documentation

## 2017-05-09 DIAGNOSIS — E785 Hyperlipidemia, unspecified: Secondary | ICD-10-CM | POA: Diagnosis not present

## 2017-05-09 DIAGNOSIS — Z87891 Personal history of nicotine dependence: Secondary | ICD-10-CM | POA: Insufficient documentation

## 2017-05-09 DIAGNOSIS — F419 Anxiety disorder, unspecified: Secondary | ICD-10-CM | POA: Insufficient documentation

## 2017-05-09 DIAGNOSIS — M4800 Spinal stenosis, site unspecified: Secondary | ICD-10-CM | POA: Diagnosis not present

## 2017-05-09 DIAGNOSIS — I509 Heart failure, unspecified: Secondary | ICD-10-CM | POA: Diagnosis not present

## 2017-05-09 DIAGNOSIS — Z79899 Other long term (current) drug therapy: Secondary | ICD-10-CM | POA: Insufficient documentation

## 2017-05-09 DIAGNOSIS — K219 Gastro-esophageal reflux disease without esophagitis: Secondary | ICD-10-CM | POA: Diagnosis not present

## 2017-05-09 DIAGNOSIS — Z8719 Personal history of other diseases of the digestive system: Secondary | ICD-10-CM | POA: Diagnosis not present

## 2017-05-09 DIAGNOSIS — F329 Major depressive disorder, single episode, unspecified: Secondary | ICD-10-CM | POA: Insufficient documentation

## 2017-05-09 DIAGNOSIS — Z86010 Personal history of colonic polyps: Secondary | ICD-10-CM | POA: Insufficient documentation

## 2017-05-09 DIAGNOSIS — C9000 Multiple myeloma not having achieved remission: Secondary | ICD-10-CM

## 2017-05-09 LAB — POTASSIUM: POTASSIUM: 4 mmol/L (ref 3.5–5.1)

## 2017-05-16 ENCOUNTER — Ambulatory Visit
Admission: RE | Admit: 2017-05-16 | Discharge: 2017-05-16 | Disposition: A | Discharge status: Home / Self Care | Payer: BLUE CROSS/BLUE SHIELD | Source: Ambulatory Visit | Attending: Hematology and Oncology | Admitting: Hematology and Oncology

## 2017-05-16 DIAGNOSIS — E538 Deficiency of other specified B group vitamins: Secondary | ICD-10-CM | POA: Diagnosis not present

## 2017-05-16 DIAGNOSIS — C9000 Multiple myeloma not having achieved remission: Secondary | ICD-10-CM | POA: Diagnosis present

## 2017-05-17 ENCOUNTER — Encounter: Payer: Self-pay | Admitting: Hematology and Oncology

## 2017-05-17 ENCOUNTER — Inpatient Hospital Stay: Payer: BLUE CROSS/BLUE SHIELD

## 2017-05-17 ENCOUNTER — Inpatient Hospital Stay (HOSPITAL_BASED_OUTPATIENT_CLINIC_OR_DEPARTMENT_OTHER): Payer: BLUE CROSS/BLUE SHIELD | Admitting: Hematology and Oncology

## 2017-05-17 VITALS — BP 155/83 | HR 55 | Temp 97.4°F | Resp 18 | Wt 215.0 lb

## 2017-05-17 DIAGNOSIS — C9000 Multiple myeloma not having achieved remission: Secondary | ICD-10-CM | POA: Diagnosis not present

## 2017-05-17 DIAGNOSIS — I25119 Atherosclerotic heart disease of native coronary artery with unspecified angina pectoris: Secondary | ICD-10-CM | POA: Diagnosis not present

## 2017-05-17 DIAGNOSIS — D709 Neutropenia, unspecified: Secondary | ICD-10-CM | POA: Diagnosis not present

## 2017-05-17 DIAGNOSIS — E119 Type 2 diabetes mellitus without complications: Secondary | ICD-10-CM

## 2017-05-17 DIAGNOSIS — K219 Gastro-esophageal reflux disease without esophagitis: Secondary | ICD-10-CM

## 2017-05-17 DIAGNOSIS — M4800 Spinal stenosis, site unspecified: Secondary | ICD-10-CM

## 2017-05-17 DIAGNOSIS — Z87891 Personal history of nicotine dependence: Secondary | ICD-10-CM

## 2017-05-17 DIAGNOSIS — G4730 Sleep apnea, unspecified: Secondary | ICD-10-CM

## 2017-05-17 DIAGNOSIS — M545 Low back pain: Secondary | ICD-10-CM

## 2017-05-17 DIAGNOSIS — F419 Anxiety disorder, unspecified: Secondary | ICD-10-CM

## 2017-05-17 DIAGNOSIS — I509 Heart failure, unspecified: Secondary | ICD-10-CM | POA: Diagnosis not present

## 2017-05-17 DIAGNOSIS — I10 Essential (primary) hypertension: Secondary | ICD-10-CM

## 2017-05-17 DIAGNOSIS — R0602 Shortness of breath: Secondary | ICD-10-CM

## 2017-05-17 DIAGNOSIS — Z79899 Other long term (current) drug therapy: Secondary | ICD-10-CM

## 2017-05-17 DIAGNOSIS — E785 Hyperlipidemia, unspecified: Secondary | ICD-10-CM

## 2017-05-17 DIAGNOSIS — E538 Deficiency of other specified B group vitamins: Secondary | ICD-10-CM

## 2017-05-17 DIAGNOSIS — Z7982 Long term (current) use of aspirin: Secondary | ICD-10-CM

## 2017-05-17 DIAGNOSIS — Z86010 Personal history of colonic polyps: Secondary | ICD-10-CM

## 2017-05-17 DIAGNOSIS — I252 Old myocardial infarction: Secondary | ICD-10-CM

## 2017-05-17 DIAGNOSIS — T451X5A Adverse effect of antineoplastic and immunosuppressive drugs, initial encounter: Secondary | ICD-10-CM

## 2017-05-17 DIAGNOSIS — Z7984 Long term (current) use of oral hypoglycemic drugs: Secondary | ICD-10-CM

## 2017-05-17 DIAGNOSIS — F329 Major depressive disorder, single episode, unspecified: Secondary | ICD-10-CM

## 2017-05-17 DIAGNOSIS — D701 Agranulocytosis secondary to cancer chemotherapy: Secondary | ICD-10-CM

## 2017-05-17 DIAGNOSIS — Z8719 Personal history of other diseases of the digestive system: Secondary | ICD-10-CM

## 2017-05-17 LAB — COMPREHENSIVE METABOLIC PANEL
ALT: 29 U/L (ref 17–63)
AST: 46 U/L — ABNORMAL HIGH (ref 15–41)
Albumin: 3.8 g/dL (ref 3.5–5.0)
Alkaline Phosphatase: 53 U/L (ref 38–126)
Anion gap: 9 (ref 5–15)
BUN: 21 mg/dL — ABNORMAL HIGH (ref 6–20)
CO2: 25 mmol/L (ref 22–32)
Calcium: 9.1 mg/dL (ref 8.9–10.3)
Chloride: 104 mmol/L (ref 101–111)
Creatinine, Ser: 0.96 mg/dL (ref 0.61–1.24)
GFR calc Af Amer: >60 mL/min (ref >60)
GFR calc non Af Amer: >60 mL/min (ref >60)
Glucose, Bld: 121 mg/dL — ABNORMAL HIGH (ref 65–99)
Potassium: 4.5 mmol/L (ref 3.5–5.1)
Sodium: 138 mmol/L (ref 135–145)
Total Bilirubin: 0.7 mg/dL (ref 0.3–1.2)
Total Protein: 7.4 g/dL (ref 6.5–8.1)

## 2017-05-17 LAB — CBC WITH DIFFERENTIAL/PLATELET
Basophils Absolute: 0.1 10*3/uL (ref 0–0.1)
Basophils Relative: 3 %
Eosinophils Absolute: 0.2 10*3/uL (ref 0–0.7)
Eosinophils Relative: 10 %
HCT: 34.3 % — ABNORMAL LOW (ref 40.0–52.0)
Hemoglobin: 12.3 g/dL — ABNORMAL LOW (ref 13.0–18.0)
Lymphocytes Relative: 41 %
Lymphs Abs: 0.9 10*3/uL — ABNORMAL LOW (ref 1.0–3.6)
MCH: 37.2 pg — ABNORMAL HIGH (ref 26.0–34.0)
MCHC: 35.8 g/dL (ref 32.0–36.0)
MCV: 104.1 fL — ABNORMAL HIGH (ref 80.0–100.0)
Monocytes Absolute: 0.3 10*3/uL (ref 0.2–1.0)
Monocytes Relative: 13 %
Neutro Abs: 0.7 10*3/uL — ABNORMAL LOW (ref 1.4–6.5)
Neutrophils Relative %: 33 %
Platelets: 138 10*3/uL — ABNORMAL LOW (ref 150–440)
RBC: 3.29 MIL/uL — ABNORMAL LOW (ref 4.40–5.90)
RDW: 13.5 % (ref 11.5–14.5)
WBC: 2.2 10*3/uL — ABNORMAL LOW (ref 3.8–10.6)

## 2017-05-17 MED ORDER — CYANOCOBALAMIN 1000 MCG/ML IJ SOLN
1000.0000 ug | Freq: Once | INTRAMUSCULAR | Status: AC
  Administered 2017-05-17: 1000 ug via INTRAMUSCULAR

## 2017-05-17 NOTE — Progress Notes (Signed)
Patient states he is SOB with exertion. He also has had problems with his hips hurting.  Gets cortisone shots in his hips.  Otherwise, no complaints. BP elevated slightly 155/83  HR 55.  Patient states he has not taken his medications today.

## 2017-05-17 NOTE — Progress Notes (Signed)
[facility] day:  05/17/17   Chief Complaint: Luis Cruey. is a 71 y.o. male with multiple myeloma who is seen for assessment on Revlimid prior to monthly B12.  HPI:  The patient was last seen in the medical oncology clinic on 04/05/2017.  At that time, he had symptoms associated with his spinal stenosis.  Exam was stable.  Hematocrit was 34.8.  ANC was borderline (1100).  Platelet count was 158,000.  Calcium was normal (9.4) on 4 calcium tablets/day. SPEP revealed no monoclonal protein.  Last cycle of Revlimid was on 04/12/2017.  ANC was 1700. Patient started his next cycle of Revlimid on 05/10/2017.  ANC is 700.  Bone survey on 05/16/2017 revealed multiple lucent lesions as previously seen. There was no convincing evidence of progression or superimposed abnormality since prior study.  Symptomatically, reports that he is doing well. Patient has has some LEFT hip and lower back pain in the previous weeks.  He is seeing Dr. Sharlet Salina with plans for injections.                                                                                                                                                                                                                       Past Medical History:  Diagnosis Date  . Angina pectoris ([place])   . Anxiety   . Arthritis   . CHF (congestive heart failure) ([place])   . Coronary artery disease   . Depression   . Diabetes mellitus without complication [facility])    Patient takes Metformin.  . Diverticulosis   . GERD (gastroesophageal reflux disease)   . Hyperlipidemia   . Hypertension   . Multiple myeloma ([place])   . Multiple myeloma ([place]) 03/27/2015  . Myocardial infarction [facility]) April 2001   widowmaker  . Shortness of breath dyspnea   . Sleep apnea    No CPAP @ present  . Spinal stenosis     Past Surgical History:  Procedure Laterality Date  . CARDIAC CATHETERIZATION    . CARPAL TUNNEL RELEASE    .  CATARACT EXTRACTION    . COLONOSCOPY WITH PROPOFOL N/A 11/11/2015   Procedure: COLONOSCOPY WITH PROPOFOL;  Surgeon: Lollie Sails, MD;  Location[facility] ENDOSCOPY;  Service: Endoscopy;  Laterality: N/A;  . CORONARY ARTERY BYPASS GRAFT    . EYE SURGERY Bilateral    Cataract Extraction  . INGUINAL HERNIA REPAIR    . JOINT REPLACEMENT Right 2008  Right Total Hip Replacement  . PILONIDAL CYST EXCISION    . ROTATOR CUFF REPAIR    . TOTAL HIP ARTHROPLASTY Right   . VENTRAL HERNIA REPAIR N/A 08/15/2015   Procedure: VENTRAL HERNIA REPAIR WITH MESH ;  Surgeon: Leonie Green, MD;  Location: [facility] ORS;  Service: General;  Laterality: N/A;    Family History  Problem Relation Age of Onset  . Heart disease Father   . Stroke Mother   . Prostate cancer Maternal Grandfather 55    Social History:  reports that he quit smoking about 17 years ago. His smoking use included Cigarettes. He has a 60.00 pack-year smoking history. He quit smokeless tobacco use about 17 years ago. He reports that he does not drink alcohol or use drugs.  He notes that his wife goes off to work.  He states that his wife would like to retire, but that would affect his medication coverage.  He has projects that he likes to do at home.  He does wood working.  His 42nd high school graduation anniversary was in 06/2016.  He has a new grand-daughter, [name].  The patient is alone today.  Allergies:  Allergies  Allergen Reactions  . Pravachol [Pravastatin]   . Pravastatin Sodium Other (See Comments)  . Statins Other (See Comments)    Muscle aches  . Zocor [Simvastatin] Other (See Comments)    Muscle aches    Current Medications: Current Outpatient Prescriptions  Medication Sig Dispense Refill  . acetaminophen (TYLENOL) 500 MG tablet Take 500 mg by mouth every 6 (six) hours as needed.    . ALPRAZolam (XANAX) 0.25 MG tablet Take 0.25 mg by mouth at bedtime as needed. for sleep  3  . aspirin 81 MG tablet Take 81 mg by  mouth daily.     [name] Pollen 500 MG CHEW Chew 1 tablet by mouth daily.    . Calcium Carbonate-Vitamin D 600-400 MG-UNIT chew tablet Chew 4 tablets by mouth daily. Takes them throughout the day for a total of 4    . Cyanocobalamin (VITAMIN B-12 IJ) Inject 1 Dose as directed every 30 (thirty) days.     . ergocalciferol (VITAMIN D2) 50000 UNITS capsule Take 50,000 Units by mouth once a week.    Marland Kitchen lisinopril (PRINIVIL,ZESTRIL) 20 MG tablet Take 1 tablet by mouth daily.    Marland Kitchen lovastatin (MEVACOR) 40 MG tablet Take 20 mg by mouth at bedtime. 2 tablets a day    . metFORMIN (GLUCOPHAGE) 500 MG tablet 500 mg daily with breakfast.     . omeprazole (PRILOSEC) 20 MG capsule Take 20 mg by mouth daily.    Marland Kitchen REVLIMID 10 MG capsule TAKE 1 CAPSULE (10MG) BY MOUTH EVERY OTHER DAY 14 capsule 1  . REVLIMID 2.5 MG capsule TAKE 1 CAPSULE (2.5MG) BY MOUTH ONCE DAILY FOR 21 DAYS ON THEN 7 DAYSOFF 21 capsule 1  . traMADol (ULTRAM) 50 MG tablet TAKE 1 TABLET BY MOUTH 3 TIMES A DAY AS NEEDED 60 tablet 3  . Zoledronic Acid (ZOMETA) 4 MG/100ML IVPB Inject into the vein.    Marland Kitchen amoxicillin (AMOXIL) 875 MG tablet     . KLOR-CON M20 20 MEQ tablet TAKE 1 TABLET (20 MEQ TOTAL) BY MOUTH DAILY. (Patient not taking: Reported on 05/17/2017) 30 tablet 2   No current facility-administered medications for this visit.     Review of Systems:  GENERAL:  Feels "ok" today.  No fevers or sweats.  Weight gain of 3 pounds. PERFORMANCE  STATUS (ECOG): 1 HEENT: No visual changes, runny nose, sore throat, mouth sores or tenderness. Lungs:  No shortness of breath or cough. No hemoptysis. Cardiac: No chest pain, palpitations, orthopnea, or PND.  Pre-op echo and stress test good. GI: No nausea, vomiting, diarrhea, melena or hematochezia. GU: No urgency, frequency, dysuria, or hematuria. Musculoskeletal: Spinal stenosis causes legs aches and hip pain.Awaiting steroid injection.  No joint pain. No muscle tenderness. Extremities: No pain  or swelling. Skin: No rashes or skin changes. Neuro: No headache, numbness or weakness, balance or coordination issues. Injection for spinal stenosis. Endocrine: Diabetes.  No thyroid issues, hot flashes or night sweats. Psych: No mood changes, depression or anxiety.  Wants to sleep a lot. Pain: No focal pain. Review of systems: All other systems reviewed and found to be negative.  Physical Exam: Blood pressure (!) 155/83, pulse (!) 55, temperature (!) 97.4 F (36.3 C), temperature source Tympanic, resp. rate 18, weight 215 lb (97.5 kg).  GENERAL: Well developed, well nourished, elderly gentleman sitting comfortably in the exam room in no acute distress.  He has a cane at his side. MENTAL STATUS: Alert and oriented to person, place and time. HEAD: Short white hair. Male pattern baldness. Normocephalic, atraumatic, face symmetric, no Cushingoid features. EYES: Blue eyes. Pupils equal round and reactive to light and accomodation. No conjunctivitis or scleral icterus. ENT: Oropharynx clear without lesion. Tongue normal. Mucous membranes moist.  RESPIRATORY: Clear to auscultation without rales, wheezes or rhonchi. CARDIOVASCULAR: Regular rate and rhythm without murmur, rub or gallop. ABDOMEN: Soft, non-tender, with active bowel sounds, and no hepatosplenomegaly. No masses. SKIN: No rashes, ulcers or lesions. EXTREMITIES: No edema, no skin discoloration or tenderness. No palpable cords. LYMPH NODES: No palpable cervical, supraclavicular, axillary or inguinal adenopathy  NEUROLOGICAL: Unremarkable. PSYCH: Appropriate   Appointment on 05/17/2017  Component Date Value Ref Range Status  . WBC 05/17/2017 2.2* 3.8 - 10.6 K/uL Final  . RBC 05/17/2017 3.29* 4.40 - 5.90 MIL/uL Final  . Hemoglobin 05/17/2017 12.3* 13.0 - 18.0 g/dL Final  . HCT 05/17/2017 34.3* 40.0 - 52.0 % Final  . MCV 05/17/2017 104.1* 80.0 - 100.0 fL Final  . MCH 05/17/2017 37.2* 26.0 - 34.0 pg Final  .  MCHC 05/17/2017 35.8  32.0 - 36.0 g/dL Final  . RDW 05/17/2017 13.5  11.5 - 14.5 % Final  . Platelets 05/17/2017 138* 150 - 440 K/uL Final  . Neutrophils Relative % 05/17/2017 33  % Final  . Neutro Abs 05/17/2017 0.7* 1.4 - 6.5 K/uL Final  . Lymphocytes Relative 05/17/2017 41  % Final  . Lymphs Abs 05/17/2017 0.9* 1.0 - 3.6 K/uL Final  . Monocytes Relative 05/17/2017 13  % Final  . Monocytes Absolute 05/17/2017 0.3  0.2 - 1.0 K/uL Final  . Eosinophils Relative 05/17/2017 10  % Final  . Eosinophils Absolute 05/17/2017 0.2  0 - 0.7 K/uL Final  . Basophils Relative 05/17/2017 3  % Final  . Basophils Absolute 05/17/2017 0.1  0 - 0.1 K/uL Final  . Sodium 05/17/2017 138  135 - 145 mmol/L Final  . Potassium 05/17/2017 4.5  3.5 - 5.1 mmol/L Final  . Chloride 05/17/2017 104  101 - 111 mmol/L Final  . CO2 05/17/2017 25  22 - 32 mmol/L Final  . Glucose, Bld 05/17/2017 121* 65 - 99 mg/dL Final  . BUN 05/17/2017 21* 6 - 20 mg/dL Final  . Creatinine, Ser 05/17/2017 0.96  0.61 - 1.24 mg/dL Final  . Calcium 05/17/2017 9.1  8.9 -  10.3 mg/dL Final  . Total Protein 05/17/2017 7.4  6.5 - 8.1 g/dL Final  . Albumin 05/17/2017 3.8  3.5 - 5.0 g/dL Final  . AST 05/17/2017 46* 15 - 41 U/L Final  . ALT 05/17/2017 29  17 - 63 U/L Final  . Alkaline Phosphatase 05/17/2017 53  38 - 126 U/L Final  . Total Bilirubin 05/17/2017 0.7  0.3 - 1.2 mg/dL Final  . GFR calc non Af Amer 05/17/2017 >60  >60 mL/min Final  . GFR calc Af Amer 05/17/2017 >60  >60 mL/min Final   Comment: (NOTE) The eGFR has been calculated using the CKD EPI equation. This calculation has not been validated in all clinical situations. eGFR's persistently <60 mL/min signify possible Chronic Kidney Disease.   Luis [name] 05/17/2017 9  5 - 15 Final    Assessment:  Luis Gaxiola. is a 71 y.o. male with stage III multiple myeloma. He presented in 02/2013 with left-sided sharp pain. Evaluation revealed wide-spread lytic lesions including  fracture of the left 5th rib laterally. CT scans on 02/22/2013 showed innumerable lytic lesions in thoracic spine, sternum, clavicle, scapula, and ribs.   Bone marrow biopsy on 03/13/2013 revealed 15 to 20% plasma cells. Iron stores were absent. Renal function was normal. SPEP on 03/01/2013 revealed a 1.4 gm/dL IgG monoclonal lambda. IgG was 1987.   He began induction with Revlimid 25 mg a day (3 weeks on and 1 week off) and Decadron (40 mg once a week) on 04/09/2013. SPEP has revealed no monoclonal protein since 07/26/2014 (last check 02/28/2017). IgG was 1362 on 07/26/2014 and 1097 on 12/19/2014.   Lambda free light chains have been monitored: 33.20 (ratio of 1.56) on 09/20/2014, 31.51 (ratio of 1.43) on 12/19/2014, 30.25 (ratio of 1.56) on 05/01/2015, 31.95 (ratio 1.49) on 01/01/2016, 28.02 (ratio 1.29) on 03/25/2016, 31.5 (ratio 1.26) on 05/28/2016, 21.8 (ratio 1.35) on 08/20/2016, and 37.3 (ratio 1.09) on 10/22/2016.  24 hour UPEP on 12/02/2015 revealed no monoclonal protein.  With remission, Revlimid was decreased to 10 mg a day then to 10 mg every other day secondary to issues with renal function and diarrhea.  Revlimid was held on 09/17/2016 secondary to neutropenia (ANC 800) then restarted on 10/05/2016. Patient started most recent cycle of Revlimid on 05/10/2017.  He was advised to hold due to neutropenia (ANC 700) until labs rechecked on 06/07/2017.   Bone survey on 05/30/2015 revealed the majority of small lytic lesions were stable.  There was possibly new lesion in the distal left clavicle and right mid femur (small) and a possible developing lucency in the proximal left femoral shaft.  The calvarial lesion noted on the prior study was not well seen (? positional).  Bone survey on 11/27/2015 revealed widespread bony lytic lesions consistent with multiple myeloma.  The vast majority of lesions were stable.  A few small lesions were not previously seen.  One lesion was previously  obscured by bowel contents.  Two small lesions in the right distal femoral diaphysis appeared new.  Bone survey on 05/25/2016 revealed no evidence of progressive myelomatous involvement of the skeleton.  Bone survey on 05/16/2017 revealed multiple lucent lesions as previously seen. There was no convincing evidence of progression or superimposed abnormality since prior study.  He received Zometa every 3 months (last 02/28/2017). He takes calcium 4 pills/day. He receives B12 monthly (last 02/28/2017). B12 was 370 on 01/17/2015.  Folate was 17.5 on 09/17/2016.  Abdominal and pelvic CT scan on 07/15/2015 revealed  extensive  sigmoid diverticulosis. There was questionable wall thickening of the ascending colon versus artifact from incomplete distention.  Colonoscopy on 11/11/2015 revealed diverticulosis in the sigmoid colon and distal descending colon and ascending colon.  There was a 2 mm polyp in the mid sigmoid colon.  Pathology revealed a hyperplastic polyp, negative for dysplasia or malignancy.  Symptomatically, he has symptoms associated with his spinal stenosis. Patient seeing Dr. Sharlet Salina (physiatry) and plans to make an appointment with orthopedics. Exam is stable.  Hematocrit is 34.3.  WBC is 2.2. ANC is low (700).  Platelet count is 138,000.  Calcium is normal (9.1) on 4 calcium tablets/day.   Plan: 1.  Labs today:  CBC with diff, CMP, SPEP. 2.  Review bone survey.  Stable.  Discuss plan to hold Zometa as M spike is 0 and no evidence of active bone disease. 3.  Hold Revlimid due to neutropenia ([place] 700). Will recheck labs on 06/07/2017 with plans to restart if blood counts have improved.  4.  B12 today, then monthly x 3. 5.  RTC on 06/07/2017 for MD assessment and labs (CBC with diff, CMP).   Lequita Asal, MD  05/17/2017, 10:13 AM

## 2017-05-18 LAB — PROTEIN ELECTROPHORESIS, SERUM
A/G Ratio: 0.9 (ref 0.7–1.7)
Albumin ELP: 3.4 g/dL (ref 2.9–4.4)
Alpha-1-Globulin: 0.2 g/dL (ref 0.0–0.4)
Alpha-2-Globulin: 0.9 g/dL (ref 0.4–1.0)
Beta Globulin: 1.3 g/dL (ref 0.7–1.3)
Gamma Globulin: 1.2 g/dL (ref 0.4–1.8)
Globulin, Total: 3.7 g/dL (ref 2.2–3.9)
Total Protein ELP: 7.1 g/dL (ref 6.0–8.5)

## 2017-05-19 ENCOUNTER — Telehealth: Payer: Self-pay | Admitting: *Deleted

## 2017-05-19 NOTE — Telephone Encounter (Signed)
Called patient to inform him his M spike is 0.  Patient verbalized understanding.

## 2017-05-19 NOTE — Telephone Encounter (Signed)
-----   Message from Lequita Asal, MD sent at 05/18/2017  4:50 PM EDT ----- Regarding: Please call patient  M spike 0  M  ----- Message ----- From: Interface, Lab In [place] Sent: 05/17/2017   9:10 AM To: Lequita Asal, MD

## 2017-05-23 ENCOUNTER — Other Ambulatory Visit: Payer: Self-pay | Admitting: Hematology and Oncology

## 2017-05-23 ENCOUNTER — Telehealth: Payer: Self-pay | Admitting: *Deleted

## 2017-05-23 NOTE — Telephone Encounter (Signed)
No auth number on rx received

## 2017-05-23 NOTE — Telephone Encounter (Signed)
I will send this thanks  t

## 2017-06-06 NOTE — Progress Notes (Signed)
[facility] day:  06/07/17   Chief Complaint: Luis [name]. is a 71 y.o. male with multiple myeloma who is seen for assessment on Revlimid prior to monthly B12.  HPI:  The patient was last seen in the medical oncology clinic on 05/17/2017.  At that time, he was doing well.  He had symptoms related to his spinal stenosis.  Bone survey was stable.  M spike was 0.  ANC was 700.  Revlimid was held.  He received B12.  During the interim, he has done well.  He feels tired today secondary to poor sleep.  He received 2 injections yesterday for spinal stenosis.  He states that 2 weeks ago he could hardly walk secondary to spinal stenosis.                                                                                                                                                                                                                         Past Medical History:  Diagnosis Date  . Angina pectoris ([place])   . Anxiety   . Arthritis   . CHF (congestive heart failure) ([place])   . Coronary artery disease   . Depression   . Diabetes mellitus without complication [facility])    Patient takes Metformin.  . Diverticulosis   . GERD (gastroesophageal reflux disease)   . Hyperlipidemia   . Hypertension   . Multiple myeloma ([place])   . Multiple myeloma ([place]) 03/27/2015  . Myocardial infarction [facility]) April 2001   widowmaker  . Shortness of breath dyspnea   . Sleep apnea    No CPAP @ present  . Spinal stenosis     Past Surgical History:  Procedure Laterality Date  . CARDIAC CATHETERIZATION    . CARPAL TUNNEL RELEASE    . CATARACT EXTRACTION    . COLONOSCOPY WITH PROPOFOL N/A 11/11/2015   Procedure: COLONOSCOPY WITH PROPOFOL;  Surgeon: Lollie Sails, MD;  Location: [facility] ENDOSCOPY;  Service: Endoscopy;  Laterality: N/A;  . CORONARY ARTERY BYPASS GRAFT    . EYE SURGERY Bilateral    Cataract Extraction  . INGUINAL HERNIA REPAIR    . JOINT REPLACEMENT  Right 2008   Right Total Hip Replacement  . PILONIDAL CYST EXCISION    . ROTATOR CUFF REPAIR    . TOTAL HIP ARTHROPLASTY Right   . VENTRAL HERNIA REPAIR N/A 08/15/2015   Procedure: VENTRAL HERNIA REPAIR WITH MESH ;  Surgeon:  Leonie Green, MD;  Location: [facility] ORS;  Service: General;  Laterality: N/A;    Family History  Problem Relation Age of Onset  . Heart disease Father   . Stroke Mother   . Prostate cancer Maternal Grandfather 66    Social History:  reports that he quit smoking about 17 years ago. His smoking use included Cigarettes. He has a 60.00 pack-year smoking history. He quit smokeless tobacco use about 17 years ago. He reports that he does not drink alcohol or use drugs.  He notes that his wife goes off to work.  He states that his wife would like to retire, but that would affect his medication coverage.  He has projects that he likes to do at home.  He does wood working.  His 42nd high school graduation anniversary was in 06/2016.  He has a new grand-daughter, [name].  His wife is retiring today.  The patient is alone today.  Allergies:  Allergies  Allergen Reactions  . Pravachol [Pravastatin]   . Pravastatin Sodium Other (See Comments)  . Statins Other (See Comments)    Muscle aches  . Zocor [Simvastatin] Other (See Comments)    Muscle aches    Current Medications: Current Outpatient Prescriptions  Medication Sig Dispense Refill  . acetaminophen (TYLENOL) 500 MG tablet Take 500 mg by mouth every 6 (six) hours as needed.    . ALPRAZolam (XANAX) 0.25 MG tablet Take 0.25 mg by mouth at bedtime as needed. for sleep  3  . amoxicillin (AMOXIL) 875 MG tablet     . aspirin 81 MG tablet Take 81 mg by mouth daily.     [name] Pollen 500 MG CHEW Chew 1 tablet by mouth daily.    . Calcium Carbonate-Vitamin D 600-400 MG-UNIT chew tablet Chew 4 tablets by mouth daily. Takes them throughout the day for a total of 4    . Cyanocobalamin (VITAMIN B-12 IJ) Inject 1 Dose as  directed every 30 (thirty) days.     . ergocalciferol (VITAMIN D2) 50000 UNITS capsule Take 50,000 Units by mouth once a week.    Marland Kitchen lisinopril (PRINIVIL,ZESTRIL) 20 MG tablet Take 1 tablet by mouth daily.    Marland Kitchen lovastatin (MEVACOR) 40 MG tablet Take 20 mg by mouth at bedtime. 2 tablets a day    . metFORMIN (GLUCOPHAGE) 500 MG tablet 500 mg daily with breakfast.     . omeprazole (PRILOSEC) 20 MG capsule Take 20 mg by mouth daily.    Marland Kitchen REVLIMID 10 MG capsule TAKE 1 CAPSULE (10MG) BY MOUTH EVERY OTHER DAY 14 capsule 1  . REVLIMID 2.5 MG capsule TAKE 1 CAPSULE (2.5MG) BY MOUTH ONCE DAILY FOR 21 DAYS ON THEN 7 DAYSOFF 21 capsule 0  . traMADol (ULTRAM) 50 MG tablet TAKE 1 TABLET BY MOUTH 3 TIMES A DAY AS NEEDED 60 tablet 3  . Zoledronic Acid (ZOMETA) 4 MG/100ML IVPB Inject into the vein.    Marland Kitchen KLOR-CON M20 20 MEQ tablet TAKE 1 TABLET (20 MEQ TOTAL) BY MOUTH DAILY. (Patient not taking: Reported on 06/07/2017) 30 tablet 2   No current facility-administered medications for this visit.     Review of Systems:  GENERAL:  Feels "tired" today.  No fevers or sweats.  Weight stable. PERFORMANCE STATUS (ECOG): 1 HEENT: No visual changes, runny nose, sore throat, mouth sores or tenderness. Lungs:  No shortness of breath or cough. No hemoptysis. Cardiac: No chest pain, palpitations, orthopnea, or PND.  Pre-op echo and stress test good.  GI: No nausea, vomiting, diarrhea, melena or hematochezia. GU: No urgency, frequency, dysuria, or hematuria. Musculoskeletal: Spinal stenosis causes legs aches and hip pain (see HPI).  No joint pain. No muscle tenderness. Extremities: No pain or swelling. Skin: No rashes or skin changes. Neuro: No headache, numbness or weakness, balance or coordination issues. Injection for spinal stenosis. Endocrine: Diabetes.  No thyroid issues, hot flashes or night sweats. Psych: No mood changes, depression or anxiety. He is a "night owl". Pain: No focal pain. Review of  systems: All other systems reviewed and found to be negative.  Physical Exam: Blood pressure 132/72, pulse 76, temperature 98 F (36.7 C), temperature source Tympanic, resp. rate 20, weight 215 lb (97.5 kg).  GENERAL: Well developed, well nourished, elderly gentleman sitting comfortably in the exam room in no acute distress.  He has a cane at his side. MENTAL STATUS: Alert and oriented to person, place and time. HEAD: Short white hair. Male pattern baldness. Normocephalic, atraumatic, face symmetric, no Cushingoid features. EYES: Blue eyes. Pupils equal round and reactive to light and accomodation. No conjunctivitis or scleral icterus. ENT: Oropharynx clear without lesion. Tongue normal. Mucous membranes moist.  RESPIRATORY: Clear to auscultation without rales, wheezes or rhonchi. CARDIOVASCULAR: Regular rate and rhythm without murmur, rub or gallop. ABDOMEN: Soft, non-tender, with active bowel sounds, and no hepatosplenomegaly. No masses. SKIN: No rashes, ulcers or lesions. EXTREMITIES: No edema, no skin discoloration or tenderness. No palpable cords. LYMPH NODES: No palpable cervical, supraclavicular, axillary or inguinal adenopathy  NEUROLOGICAL: Unremarkable. PSYCH: Appropriate   Orders Only on 06/07/2017  Component Date Value Ref Range Status  . WBC 06/07/2017 6.9  3.8 - 10.6 K/uL Final  . RBC 06/07/2017 3.64* 4.40 - 5.90 MIL/uL Final  . Hemoglobin 06/07/2017 13.3  13.0 - 18.0 g/dL Final  . HCT 06/07/2017 37.5* 40.0 - 52.0 % Final  . MCV 06/07/2017 103.0* 80.0 - 100.0 fL Final  . MCH 06/07/2017 36.6* 26.0 - 34.0 pg Final  . MCHC 06/07/2017 35.5  32.0 - 36.0 g/dL Final  . RDW 06/07/2017 13.5  11.5 - 14.5 % Final  . Platelets 06/07/2017 202  150 - 440 K/uL Final  . Neutrophils Relative % 06/07/2017 85  % Final  . Neutro Abs 06/07/2017 5.8  1.4 - 6.5 K/uL Final  . Lymphocytes Relative 06/07/2017 10  % Final  . Lymphs Abs 06/07/2017 0.7* 1.0 - 3.6 K/uL Final  .  Monocytes Relative 06/07/2017 5  % Final  . Monocytes Absolute 06/07/2017 0.3  0.2 - 1.0 K/uL Final  . Eosinophils Relative 06/07/2017 0  % Final  . Eosinophils Absolute 06/07/2017 0.0  0 - 0.7 K/uL Final  . Basophils Relative 06/07/2017 0  % Final  . Basophils Absolute 06/07/2017 0.0  0 - 0.1 K/uL Final  . Sodium 06/07/2017 132* 135 - 145 mmol/L Final  . Potassium 06/07/2017 4.3  3.5 - 5.1 mmol/L Final  . Chloride 06/07/2017 102  101 - 111 mmol/L Final  . CO2 06/07/2017 21* 22 - 32 mmol/L Final  . Glucose, Bld 06/07/2017 275* 65 - 99 mg/dL Final  . BUN 06/07/2017 25* 6 - 20 mg/dL Final  . Creatinine, Ser 06/07/2017 1.06  0.61 - 1.24 mg/dL Final  . Calcium 06/07/2017 9.4  8.9 - 10.3 mg/dL Final  . Total Protein 06/07/2017 7.8  6.5 - 8.1 g/dL Final  . Albumin 06/07/2017 3.9  3.5 - 5.0 g/dL Final  . AST 06/07/2017 38  15 - 41 U/L Final  . ALT  06/07/2017 24  17 - 63 U/L Final  . Alkaline Phosphatase 06/07/2017 45  38 - 126 U/L Final  . Total Bilirubin 06/07/2017 0.7  0.3 - 1.2 mg/dL Final  . GFR calc non Af Amer 06/07/2017 >60  >60 mL/min Final  . GFR calc Af Amer 06/07/2017 >60  >60 mL/min Final   Comment: (NOTE) The eGFR has been calculated using the CKD EPI equation. This calculation has not been validated in all clinical situations. eGFR's persistently <60 mL/min signify possible Chronic Kidney Disease.   Georgiann Hahn gap 06/07/2017 9  5 - 15 Final    Assessment:  Luis Brood. is a 71 y.o. male with stage III multiple myeloma. He presented in 02/2013 with left-sided sharp pain. Evaluation revealed wide-spread lytic lesions including fracture of the left 5th rib laterally. CT scans on 02/22/2013 showed innumerable lytic lesions in thoracic spine, sternum, clavicle, scapula, and ribs.   Bone marrow biopsy on 03/13/2013 revealed 15 to 20% plasma cells. Iron stores were absent. Renal function was normal. SPEP on 03/01/2013 revealed a 1.4 gm/dL IgG monoclonal lambda. IgG was 1987.    He began induction with Revlimid 25 mg a day (3 weeks on and 1 week off) and Decadron (40 mg once a week) on 04/09/2013. SPEP has revealed no monoclonal protein since 07/26/2014 (last check 02/28/2017). IgG was 1362 on 07/26/2014 and 1097 on 12/19/2014.   Lambda free light chains have been monitored: 33.20 (ratio of 1.56) on 09/20/2014, 31.51 (ratio of 1.43) on 12/19/2014, 30.25 (ratio of 1.56) on 05/01/2015, 31.95 (ratio 1.49) on 01/01/2016, 28.02 (ratio 1.29) on 03/25/2016, 31.5 (ratio 1.26) on 05/28/2016, 21.8 (ratio 1.35) on 08/20/2016, and 37.3 (ratio 1.09) on 10/22/2016.  24 hour UPEP on 12/02/2015 revealed no monoclonal protein.  With remission, Revlimid was decreased to 10 mg a day then to 10 mg every other day secondary to issues with renal function and diarrhea.  Revlimid was held on 09/17/2016 secondary to neutropenia (ANC 800) then restarted on 10/05/2016. Patient started most recent cycle of Revlimid on 05/10/2017.  He was advised to hold due to neutropenia (ANC 700) until labs rechecked on 06/07/2017.   Bone survey on 05/30/2015 revealed the majority of small lytic lesions were stable.  There was possibly new lesion in the distal left clavicle and right mid femur (small) and a possible developing lucency in the proximal left femoral shaft.  The calvarial lesion noted on the prior study was not well seen (? positional).  Bone survey on 11/27/2015 revealed widespread bony lytic lesions consistent with multiple myeloma.  The vast majority of lesions were stable.  A few small lesions were not previously seen.  One lesion was previously obscured by bowel contents.  Two small lesions in the right distal femoral diaphysis appeared new.  Bone survey on 05/25/2016 revealed no evidence of progressive myelomatous involvement of the skeleton.  Bone survey on 05/16/2017 revealed multiple lucent lesions as previously seen. There was no convincing evidence of progression or superimposed abnormality  since prior study.  He received Zometa every 3 months (last 02/28/2017). He takes calcium 4 pills/day. He receives B12 monthly (last 05/17/2017). B12 was 370 on 01/17/2015.  Folate was 17.5 on 09/17/2016.  Abdominal and pelvic CT scan on 07/15/2015 revealed  extensive sigmoid diverticulosis. There was questionable wall thickening of the ascending colon versus artifact from incomplete distention.  Colonoscopy on 11/11/2015 revealed diverticulosis in the sigmoid colon and distal descending colon and ascending colon.  There was a 2 mm  polyp in the mid sigmoid colon.  Pathology revealed a hyperplastic polyp, negative for dysplasia or malignancy.  Symptomatically, he is tired today.  Exam is stable.  Counts have recovered.  Plan: 1.  Labs today:  CBC with diff, CMP. 2.  Continue B12 monthly x 3 (last given 05/17/2017) 3.  Begin Revlimid 3 weeks on/1 week off. 4.  RTC in 1 month for MD assessment and labs (CBC with diff, CMP, SPEP).   Lequita Asal, MD  06/07/2017, 9:27 AM

## 2017-06-07 ENCOUNTER — Other Ambulatory Visit: Payer: Self-pay

## 2017-06-07 ENCOUNTER — Inpatient Hospital Stay: Payer: BLUE CROSS/BLUE SHIELD

## 2017-06-07 ENCOUNTER — Inpatient Hospital Stay (HOSPITAL_BASED_OUTPATIENT_CLINIC_OR_DEPARTMENT_OTHER): Payer: BLUE CROSS/BLUE SHIELD | Admitting: Hematology and Oncology

## 2017-06-07 ENCOUNTER — Encounter: Payer: Self-pay | Admitting: Hematology and Oncology

## 2017-06-07 VITALS — BP 132/72 | HR 76 | Temp 98.0°F | Resp 20 | Wt 215.0 lb

## 2017-06-07 DIAGNOSIS — F419 Anxiety disorder, unspecified: Secondary | ICD-10-CM

## 2017-06-07 DIAGNOSIS — R0602 Shortness of breath: Secondary | ICD-10-CM

## 2017-06-07 DIAGNOSIS — I25119 Atherosclerotic heart disease of native coronary artery with unspecified angina pectoris: Secondary | ICD-10-CM

## 2017-06-07 DIAGNOSIS — E538 Deficiency of other specified B group vitamins: Secondary | ICD-10-CM | POA: Diagnosis not present

## 2017-06-07 DIAGNOSIS — I509 Heart failure, unspecified: Secondary | ICD-10-CM

## 2017-06-07 DIAGNOSIS — Z87891 Personal history of nicotine dependence: Secondary | ICD-10-CM

## 2017-06-07 DIAGNOSIS — C9000 Multiple myeloma not having achieved remission: Secondary | ICD-10-CM

## 2017-06-07 DIAGNOSIS — M545 Low back pain: Secondary | ICD-10-CM | POA: Diagnosis not present

## 2017-06-07 DIAGNOSIS — D709 Neutropenia, unspecified: Secondary | ICD-10-CM | POA: Diagnosis not present

## 2017-06-07 DIAGNOSIS — Z7982 Long term (current) use of aspirin: Secondary | ICD-10-CM

## 2017-06-07 DIAGNOSIS — E119 Type 2 diabetes mellitus without complications: Secondary | ICD-10-CM

## 2017-06-07 DIAGNOSIS — Z7984 Long term (current) use of oral hypoglycemic drugs: Secondary | ICD-10-CM

## 2017-06-07 DIAGNOSIS — I252 Old myocardial infarction: Secondary | ICD-10-CM

## 2017-06-07 DIAGNOSIS — E785 Hyperlipidemia, unspecified: Secondary | ICD-10-CM

## 2017-06-07 DIAGNOSIS — F329 Major depressive disorder, single episode, unspecified: Secondary | ICD-10-CM

## 2017-06-07 DIAGNOSIS — K219 Gastro-esophageal reflux disease without esophagitis: Secondary | ICD-10-CM

## 2017-06-07 DIAGNOSIS — M4800 Spinal stenosis, site unspecified: Secondary | ICD-10-CM | POA: Diagnosis not present

## 2017-06-07 DIAGNOSIS — Z86010 Personal history of colonic polyps: Secondary | ICD-10-CM

## 2017-06-07 DIAGNOSIS — Z8719 Personal history of other diseases of the digestive system: Secondary | ICD-10-CM

## 2017-06-07 DIAGNOSIS — I10 Essential (primary) hypertension: Secondary | ICD-10-CM

## 2017-06-07 DIAGNOSIS — G4730 Sleep apnea, unspecified: Secondary | ICD-10-CM

## 2017-06-07 DIAGNOSIS — Z79899 Other long term (current) drug therapy: Secondary | ICD-10-CM

## 2017-06-07 LAB — COMPREHENSIVE METABOLIC PANEL
ALT: 24 U/L (ref 17–63)
AST: 38 U/L (ref 15–41)
Albumin: 3.9 g/dL (ref 3.5–5.0)
Alkaline Phosphatase: 45 U/L (ref 38–126)
Anion gap: 9 (ref 5–15)
BUN: 25 mg/dL — ABNORMAL HIGH (ref 6–20)
CO2: 21 mmol/L — ABNORMAL LOW (ref 22–32)
Calcium: 9.4 mg/dL (ref 8.9–10.3)
Chloride: 102 mmol/L (ref 101–111)
Creatinine, Ser: 1.06 mg/dL (ref 0.61–1.24)
GFR calc Af Amer: >60 mL/min (ref >60)
GFR calc non Af Amer: >60 mL/min (ref >60)
Glucose, Bld: 275 mg/dL — ABNORMAL HIGH (ref 65–99)
Potassium: 4.3 mmol/L (ref 3.5–5.1)
Sodium: 132 mmol/L — ABNORMAL LOW (ref 135–145)
Total Bilirubin: 0.7 mg/dL (ref 0.3–1.2)
Total Protein: 7.8 g/dL (ref 6.5–8.1)

## 2017-06-07 LAB — CBC WITH DIFFERENTIAL/PLATELET
Basophils Absolute: 0 10*3/uL (ref 0–0.1)
Basophils Relative: 0 %
Eosinophils Absolute: 0 10*3/uL (ref 0–0.7)
Eosinophils Relative: 0 %
HCT: 37.5 % — ABNORMAL LOW (ref 40.0–52.0)
Hemoglobin: 13.3 g/dL (ref 13.0–18.0)
Lymphocytes Relative: 10 %
Lymphs Abs: 0.7 10*3/uL — ABNORMAL LOW (ref 1.0–3.6)
MCH: 36.6 pg — ABNORMAL HIGH (ref 26.0–34.0)
MCHC: 35.5 g/dL (ref 32.0–36.0)
MCV: 103 fL — ABNORMAL HIGH (ref 80.0–100.0)
Monocytes Absolute: 0.3 10*3/uL (ref 0.2–1.0)
Monocytes Relative: 5 %
Neutro Abs: 5.8 10*3/uL (ref 1.4–6.5)
Neutrophils Relative %: 85 %
Platelets: 202 10*3/uL (ref 150–440)
RBC: 3.64 MIL/uL — ABNORMAL LOW (ref 4.40–5.90)
RDW: 13.5 % (ref 11.5–14.5)
WBC: 6.9 10*3/uL (ref 3.8–10.6)

## 2017-06-07 NOTE — Progress Notes (Signed)
Patient had a steroid injection in his spine yesterday for his spinal stenosis.  Continues to have hip and leg pain.

## 2017-06-14 ENCOUNTER — Inpatient Hospital Stay: Payer: Medicare Other | Attending: Hematology and Oncology

## 2017-06-14 DIAGNOSIS — E119 Type 2 diabetes mellitus without complications: Secondary | ICD-10-CM | POA: Diagnosis not present

## 2017-06-14 DIAGNOSIS — E785 Hyperlipidemia, unspecified: Secondary | ICD-10-CM | POA: Diagnosis not present

## 2017-06-14 DIAGNOSIS — F329 Major depressive disorder, single episode, unspecified: Secondary | ICD-10-CM | POA: Diagnosis not present

## 2017-06-14 DIAGNOSIS — I219 Acute myocardial infarction, unspecified: Secondary | ICD-10-CM | POA: Diagnosis not present

## 2017-06-14 DIAGNOSIS — I509 Heart failure, unspecified: Secondary | ICD-10-CM | POA: Insufficient documentation

## 2017-06-14 DIAGNOSIS — E538 Deficiency of other specified B group vitamins: Secondary | ICD-10-CM | POA: Diagnosis not present

## 2017-06-14 DIAGNOSIS — F419 Anxiety disorder, unspecified: Secondary | ICD-10-CM | POA: Diagnosis not present

## 2017-06-14 DIAGNOSIS — Z79899 Other long term (current) drug therapy: Secondary | ICD-10-CM | POA: Diagnosis not present

## 2017-06-14 DIAGNOSIS — R112 Nausea with vomiting, unspecified: Secondary | ICD-10-CM | POA: Insufficient documentation

## 2017-06-14 DIAGNOSIS — K5730 Diverticulosis of large intestine without perforation or abscess without bleeding: Secondary | ICD-10-CM | POA: Insufficient documentation

## 2017-06-14 DIAGNOSIS — Z7982 Long term (current) use of aspirin: Secondary | ICD-10-CM | POA: Diagnosis not present

## 2017-06-14 DIAGNOSIS — R0602 Shortness of breath: Secondary | ICD-10-CM | POA: Insufficient documentation

## 2017-06-14 DIAGNOSIS — G4730 Sleep apnea, unspecified: Secondary | ICD-10-CM | POA: Diagnosis not present

## 2017-06-14 DIAGNOSIS — K219 Gastro-esophageal reflux disease without esophagitis: Secondary | ICD-10-CM | POA: Insufficient documentation

## 2017-06-14 DIAGNOSIS — M4800 Spinal stenosis, site unspecified: Secondary | ICD-10-CM | POA: Diagnosis not present

## 2017-06-14 DIAGNOSIS — I25119 Atherosclerotic heart disease of native coronary artery with unspecified angina pectoris: Secondary | ICD-10-CM | POA: Insufficient documentation

## 2017-06-14 DIAGNOSIS — Z87891 Personal history of nicotine dependence: Secondary | ICD-10-CM | POA: Insufficient documentation

## 2017-06-14 DIAGNOSIS — C9000 Multiple myeloma not having achieved remission: Secondary | ICD-10-CM | POA: Insufficient documentation

## 2017-06-14 DIAGNOSIS — I10 Essential (primary) hypertension: Secondary | ICD-10-CM | POA: Insufficient documentation

## 2017-06-14 MED ORDER — CYANOCOBALAMIN 1000 MCG/ML IJ SOLN
1000.0000 ug | Freq: Once | INTRAMUSCULAR | Status: AC
  Administered 2017-06-14: 1000 ug via INTRAMUSCULAR
  Filled 2017-06-14: qty 1

## 2017-06-24 ENCOUNTER — Telehealth: Payer: Self-pay | Admitting: *Deleted

## 2017-06-24 NOTE — Telephone Encounter (Signed)
Called patient and discussed plan of 3 weeks on 1 week off with him and he voiced understanding.  Patient is awaiting call from pharmacy for shipment.

## 2017-06-24 NOTE — Telephone Encounter (Signed)
Will touch base with him next week to check on approval

## 2017-06-24 NOTE — Telephone Encounter (Signed)
[facility] called him back and said that his grant ran out and his co pay will be $6700 a month. He only has 2 doses left. He has contacted the [facility] to see if they can help him.

## 2017-06-24 NOTE — Telephone Encounter (Signed)
Patient called reporting that he is out of Revlimid, He called [facility], but they have not returned his call (his insurance changed) He is asking if he should be off of medicine for a week. Please return his call [phone]

## 2017-06-28 ENCOUNTER — Telehealth: Payer: Self-pay | Admitting: *Deleted

## 2017-06-28 ENCOUNTER — Telehealth: Payer: Self-pay | Admitting: Hematology and Oncology

## 2017-06-28 ENCOUNTER — Other Ambulatory Visit: Payer: Self-pay | Admitting: Hematology and Oncology

## 2017-06-28 MED ORDER — LENALIDOMIDE 2.5 MG PO CAPS: ORAL_CAPSULE | ORAL | 0 refills | Status: DC

## 2017-06-28 NOTE — Telephone Encounter (Signed)
FYI, I know you already know about this patient but I wanted to send along this information

## 2017-06-28 NOTE — Telephone Encounter (Signed)
Patient contacted our office and is unable to pay the copayment of $3000.00 for his Revlimid. I went to [facility] [place] was able to get $10,000.00 to help with his copayment. Notified Mr. Hippert with this information and told him that [facility] would be contacting him to set up arrangements for delivery. [facility] in [place], [place] to give them the card information for the Co-Pay Relief. Dr. Mike Gip escribed a new script to [place].  I will continue to work on others funds for Mr. Bahl.    [place] Patient Advocate [phone] 06/28/2017 4:28 PM

## 2017-06-28 NOTE — Telephone Encounter (Signed)
Alyson, can you assist this patient  Thanks,  Leana Roe

## 2017-06-28 NOTE — Telephone Encounter (Signed)
Asking for someone to help him with co pay assistance for Revlimid. He cannot afford $3000 a month. I spoke with [facility] and she will look into getting help for him

## 2017-06-29 NOTE — Telephone Encounter (Signed)
That is awesome  Thanks

## 2017-07-05 ENCOUNTER — Telehealth: Payer: Self-pay | Admitting: *Deleted

## 2017-07-05 NOTE — Telephone Encounter (Signed)
Patient called to let Dr Mike Gip know that his Revlimid will be delivered today and he will start it tonight, He also reports he has been off drug for a month.  He has an appointment Friday

## 2017-07-08 ENCOUNTER — Inpatient Hospital Stay (HOSPITAL_BASED_OUTPATIENT_CLINIC_OR_DEPARTMENT_OTHER): Payer: Medicare Other | Admitting: Hematology and Oncology

## 2017-07-08 ENCOUNTER — Inpatient Hospital Stay: Payer: Medicare Other

## 2017-07-08 VITALS — BP 132/73 | HR 71 | Temp 97.8°F | Resp 18 | Wt 215.5 lb

## 2017-07-08 DIAGNOSIS — I509 Heart failure, unspecified: Secondary | ICD-10-CM

## 2017-07-08 DIAGNOSIS — K5730 Diverticulosis of large intestine without perforation or abscess without bleeding: Secondary | ICD-10-CM

## 2017-07-08 DIAGNOSIS — C9000 Multiple myeloma not having achieved remission: Secondary | ICD-10-CM | POA: Diagnosis not present

## 2017-07-08 DIAGNOSIS — F419 Anxiety disorder, unspecified: Secondary | ICD-10-CM

## 2017-07-08 DIAGNOSIS — Z79899 Other long term (current) drug therapy: Secondary | ICD-10-CM

## 2017-07-08 DIAGNOSIS — Z7982 Long term (current) use of aspirin: Secondary | ICD-10-CM

## 2017-07-08 DIAGNOSIS — K219 Gastro-esophageal reflux disease without esophagitis: Secondary | ICD-10-CM

## 2017-07-08 DIAGNOSIS — F329 Major depressive disorder, single episode, unspecified: Secondary | ICD-10-CM | POA: Diagnosis not present

## 2017-07-08 DIAGNOSIS — E119 Type 2 diabetes mellitus without complications: Secondary | ICD-10-CM

## 2017-07-08 DIAGNOSIS — I25119 Atherosclerotic heart disease of native coronary artery with unspecified angina pectoris: Secondary | ICD-10-CM | POA: Diagnosis not present

## 2017-07-08 DIAGNOSIS — R112 Nausea with vomiting, unspecified: Secondary | ICD-10-CM | POA: Diagnosis not present

## 2017-07-08 DIAGNOSIS — M4800 Spinal stenosis, site unspecified: Secondary | ICD-10-CM

## 2017-07-08 DIAGNOSIS — Z87891 Personal history of nicotine dependence: Secondary | ICD-10-CM

## 2017-07-08 DIAGNOSIS — G4730 Sleep apnea, unspecified: Secondary | ICD-10-CM

## 2017-07-08 DIAGNOSIS — R0602 Shortness of breath: Secondary | ICD-10-CM

## 2017-07-08 DIAGNOSIS — I10 Essential (primary) hypertension: Secondary | ICD-10-CM

## 2017-07-08 DIAGNOSIS — E538 Deficiency of other specified B group vitamins: Secondary | ICD-10-CM

## 2017-07-08 DIAGNOSIS — I219 Acute myocardial infarction, unspecified: Secondary | ICD-10-CM

## 2017-07-08 DIAGNOSIS — E785 Hyperlipidemia, unspecified: Secondary | ICD-10-CM

## 2017-07-08 LAB — CBC WITH DIFFERENTIAL/PLATELET
Basophils Absolute: 0.1 10*3/uL (ref 0–0.1)
Basophils Relative: 2 %
Eosinophils Absolute: 0.2 10*3/uL (ref 0–0.7)
Eosinophils Relative: 5 %
HCT: 37.5 % — ABNORMAL LOW (ref 40.0–52.0)
Hemoglobin: 13.2 g/dL (ref 13.0–18.0)
Lymphocytes Relative: 21 %
Lymphs Abs: 1 10*3/uL (ref 1.0–3.6)
MCH: 36.4 pg — ABNORMAL HIGH (ref 26.0–34.0)
MCHC: 35.3 g/dL (ref 32.0–36.0)
MCV: 103.1 fL — ABNORMAL HIGH (ref 80.0–100.0)
Monocytes Absolute: 0.5 10*3/uL (ref 0.2–1.0)
Monocytes Relative: 11 %
Neutro Abs: 3 10*3/uL (ref 1.4–6.5)
Neutrophils Relative %: 61 %
Platelets: 196 10*3/uL (ref 150–440)
RBC: 3.63 MIL/uL — ABNORMAL LOW (ref 4.40–5.90)
RDW: 13.6 % (ref 11.5–14.5)
WBC: 4.9 10*3/uL (ref 3.8–10.6)

## 2017-07-08 LAB — COMPREHENSIVE METABOLIC PANEL
ALT: 25 U/L (ref 17–63)
AST: 39 U/L (ref 15–41)
Albumin: 4 g/dL (ref 3.5–5.0)
Alkaline Phosphatase: 52 U/L (ref 38–126)
Anion gap: 9 (ref 5–15)
BUN: 25 mg/dL — ABNORMAL HIGH (ref 6–20)
CO2: 24 mmol/L (ref 22–32)
Calcium: 9.2 mg/dL (ref 8.9–10.3)
Chloride: 101 mmol/L (ref 101–111)
Creatinine, Ser: 1.17 mg/dL (ref 0.61–1.24)
GFR calc Af Amer: >60 mL/min (ref >60)
GFR calc non Af Amer: >60 mL/min (ref >60)
Glucose, Bld: 144 mg/dL — ABNORMAL HIGH (ref 65–99)
Potassium: 4.8 mmol/L (ref 3.5–5.1)
Sodium: 134 mmol/L — ABNORMAL LOW (ref 135–145)
Total Bilirubin: 0.8 mg/dL (ref 0.3–1.2)
Total Protein: 8.3 g/dL — ABNORMAL HIGH (ref 6.5–8.1)

## 2017-07-08 NOTE — Progress Notes (Signed)
[facility] day:  07/08/17   Chief Complaint: Luis Gaudin. is a 71 y.o. male with multiple myeloma who is seen for assessment prior to reinitiation of Revlimid.  HPI:  The patient was last seen in the medical oncology clinic on 06/07/2017.  At that time, he was doing well.  Sleep was poor.  He had ongoing issues with spinal stenosis.  He is feeling poorly today. Patient woke at 4:00 AM with nausea and vomiting.  He started Revlimid back on 07/05/2017 after being off of the medication for a month due to insurance.  Patient normally has constipation on the Revlimid for the initial 3 days of a cycle.  He denies any diarrhea or fever.                                                                                                                                                                                                                       Past Medical History:  Diagnosis Date  . Angina pectoris ([place])   . Anxiety   . Arthritis   . CHF (congestive heart failure) ([place])   . Coronary artery disease   . Depression   . Diabetes mellitus without complication [facility])    Patient takes Metformin.  . Diverticulosis   . GERD (gastroesophageal reflux disease)   . Hyperlipidemia   . Hypertension   . Multiple myeloma ([place])   . Multiple myeloma ([place]) 03/27/2015  . Myocardial infarction [facility]) April 2001   widowmaker  . Shortness of breath dyspnea   . Sleep apnea    No CPAP @ present  . Spinal stenosis     Past Surgical History:  Procedure Laterality Date  . CARDIAC CATHETERIZATION    . CARPAL TUNNEL RELEASE    . CATARACT EXTRACTION    . COLONOSCOPY WITH PROPOFOL N/A 11/11/2015   Procedure: COLONOSCOPY WITH PROPOFOL;  Surgeon: Lollie Sails, MD;  Location: [facility] ENDOSCOPY;  Service: Endoscopy;  Laterality: N/A;  . CORONARY ARTERY BYPASS GRAFT    . EYE SURGERY Bilateral    Cataract Extraction  . INGUINAL HERNIA REPAIR    . JOINT REPLACEMENT  Right 2008   Right Total Hip Replacement  . PILONIDAL CYST EXCISION    . ROTATOR CUFF REPAIR    . TOTAL HIP ARTHROPLASTY Right   . VENTRAL HERNIA REPAIR N/A 08/15/2015   Procedure: VENTRAL HERNIA REPAIR WITH MESH ;  Surgeon: Leonie Green, MD;  Location:  [facility] ORS;  Service: General;  Laterality: N/A;    Family History  Problem Relation Age of Onset  . Heart disease Father   . Stroke Mother   . Prostate cancer Maternal Grandfather 31    Social History:  reports that he quit smoking about 17 years ago. His smoking use included Cigarettes. He has a 60.00 pack-year smoking history. He quit smokeless tobacco use about 17 years ago. He reports that he does not drink alcohol or use drugs.  He notes that his wife goes off to work.  He states that his wife would like to retire, but that would affect his medication coverage.  He has projects that he likes to do at home.  He does wood working.  His 42nd high school graduation anniversary was in 06/2016.  He has a new grand-daughter, [name].  His wife is retiring today.  The patient is alone today.  Allergies:  Allergies  Allergen Reactions  . Pravachol [Pravastatin]   . Pravastatin Sodium Other (See Comments)  . Statins Other (See Comments)    Muscle aches  . Zocor [Simvastatin] Other (See Comments)    Muscle aches    Current Medications: Current Outpatient Prescriptions  Medication Sig Dispense Refill  . acetaminophen (TYLENOL) 500 MG tablet Take 500 mg by mouth every 6 (six) hours as needed.    . ALPRAZolam (XANAX) 0.25 MG tablet Take 0.25 mg by mouth at bedtime as needed. for sleep  3  . amoxicillin (AMOXIL) 875 MG tablet     . aspirin 81 MG tablet Take 81 mg by mouth daily.     [name] Pollen 500 MG CHEW Chew 1 tablet by mouth daily.    . Calcium Carbonate-Vitamin D 600-400 MG-UNIT chew tablet Chew 4 tablets by mouth daily. Takes them throughout the day for a total of 4    . Cyanocobalamin (VITAMIN B-12 IJ) Inject 1 Dose as  directed every 30 (thirty) days.     . ergocalciferol (VITAMIN D2) 50000 UNITS capsule Take 50,000 Units by mouth once a week.    . lenalidomide (REVLIMID) 2.5 MG capsule TAKE 1 CAPSULE (2.5MG) BY MOUTH ONCE DAILY FOR 21 DAYS ON THEN 7 DAYSOFF 21 capsule 0  . lisinopril (PRINIVIL,ZESTRIL) 20 MG tablet Take 1 tablet by mouth daily.    Marland Kitchen lovastatin (MEVACOR) 40 MG tablet Take 20 mg by mouth at bedtime. 2 tablets a day    . metFORMIN (GLUCOPHAGE) 500 MG tablet 500 mg daily with breakfast.     . omeprazole (PRILOSEC) 20 MG capsule Take 20 mg by mouth daily.    . traMADol (ULTRAM) 50 MG tablet TAKE 1 TABLET BY MOUTH 3 TIMES A DAY AS NEEDED 60 tablet 3  . Zoledronic Acid (ZOMETA) 4 MG/100ML IVPB Inject into the vein.    Marland Kitchen KLOR-CON M20 20 MEQ tablet TAKE 1 TABLET (20 MEQ TOTAL) BY MOUTH DAILY. (Patient not taking: Reported on 06/07/2017) 30 tablet 2   No current facility-administered medications for this visit.     Review of Systems:  GENERAL:  Feels "poorly" today.  No fevers or sweats.  Weight stable. PERFORMANCE STATUS (ECOG): 1 HEENT: No visual changes, runny nose, sore throat, mouth sores or tenderness. Lungs:  No shortness of breath or cough. No hemoptysis. Cardiac: No chest pain, palpitations, orthopnea, or PND.  Pre-op echo and stress test good. GI: Nausea and vomiting (see HPI).  No diarrhea, melena or hematochezia. GU: No urgency, frequency, dysuria, or hematuria. Musculoskeletal: Spinal stenosis causes  legs aches and hip pain.  No joint pain. No muscle tenderness. Extremities: No pain or swelling. Skin: No rashes or skin changes. Neuro: No headache, numbness or weakness, balance or coordination issues. Injection for spinal stenosis. Endocrine: Diabetes.  No thyroid issues, hot flashes or night sweats. Psych: No mood changes, depression or anxiety. He is a "night owl". Pain: No focal pain. Review of systems: All other systems reviewed and found to be  negative.  Physical Exam: Blood pressure 132/73, pulse 71, temperature 97.8 F (36.6 C), temperature source Tympanic, resp. rate 18, weight 215 lb 8 oz (97.8 kg).  GENERAL: Well developed, well nourished, elderly gentleman sitting comfortably in the exam room in no acute distress.  He has a cane at his side. MENTAL STATUS: Alert and oriented to person, place and time. HEAD: Short white hair. Male pattern baldness. Normocephalic, atraumatic, face symmetric, no Cushingoid features. EYES: Blue eyes. Pupils equal round and reactive to light and accomodation. No conjunctivitis or scleral icterus. ENT: Oropharynx clear without lesion. Tongue normal. Mucous membranes moist.  RESPIRATORY: Clear to auscultation without rales, wheezes or rhonchi. CARDIOVASCULAR: Regular rate and rhythm without murmur, rub or gallop. ABDOMEN: Soft, non-tender, with active bowel sounds, and no hepatosplenomegaly. No guarding or rebound tenderness.  No masses. SKIN: No rashes, ulcers or lesions. EXTREMITIES: No edema, no skin discoloration or tenderness. No palpable cords. LYMPH NODES: No palpable cervical, supraclavicular, axillary or inguinal adenopathy  NEUROLOGICAL: Unremarkable. PSYCH: Appropriate   Appointment on 07/08/2017  Component Date Value Ref Range Status  . Sodium 07/08/2017 134* 135 - 145 mmol/L Final  . Potassium 07/08/2017 4.8  3.5 - 5.1 mmol/L Final  . Chloride 07/08/2017 101  101 - 111 mmol/L Final  . CO2 07/08/2017 24  22 - 32 mmol/L Final  . Glucose, Bld 07/08/2017 144* 65 - 99 mg/dL Final  . BUN 07/08/2017 25* 6 - 20 mg/dL Final  . Creatinine, Ser 07/08/2017 1.17  0.61 - 1.24 mg/dL Final  . Calcium 07/08/2017 9.2  8.9 - 10.3 mg/dL Final  . Total Protein 07/08/2017 8.3* 6.5 - 8.1 g/dL Final  . Albumin 07/08/2017 4.0  3.5 - 5.0 g/dL Final  . AST 07/08/2017 39  15 - 41 U/L Final  . ALT 07/08/2017 25  17 - 63 U/L Final  . Alkaline Phosphatase 07/08/2017 52  38 - 126 U/L Final   . Total Bilirubin 07/08/2017 0.8  0.3 - 1.2 mg/dL Final  . GFR calc non Af Amer 07/08/2017 >60  >60 mL/min Final  . GFR calc Af Amer 07/08/2017 >60  >60 mL/min Final   Comment: (NOTE) The eGFR has been calculated using the CKD EPI equation. This calculation has not been validated in all clinical situations. eGFR's persistently <60 mL/min signify possible Chronic Kidney Disease.   . Anion gap 07/08/2017 9  5 - 15 Final  . WBC 07/08/2017 4.9  3.8 - 10.6 K/uL Final  . RBC 07/08/2017 3.63* 4.40 - 5.90 MIL/uL Final  . Hemoglobin 07/08/2017 13.2  13.0 - 18.0 g/dL Final  . HCT 07/08/2017 37.5* 40.0 - 52.0 % Final  . MCV 07/08/2017 103.1* 80.0 - 100.0 fL Final  . MCH 07/08/2017 36.4* 26.0 - 34.0 pg Final  . MCHC 07/08/2017 35.3  32.0 - 36.0 g/dL Final  . RDW 07/08/2017 13.6  11.5 - 14.5 % Final  . Platelets 07/08/2017 196  150 - 440 K/uL Final  . Neutrophils Relative % 07/08/2017 61  % Final  . Neutro Abs 07/08/2017 3.0  1.4 - 6.5 K/uL Final  . Lymphocytes Relative 07/08/2017 21  % Final  . Lymphs Abs 07/08/2017 1.0  1.0 - 3.6 K/uL Final  . Monocytes Relative 07/08/2017 11  % Final  . Monocytes Absolute 07/08/2017 0.5  0.2 - 1.0 K/uL Final  . Eosinophils Relative 07/08/2017 5  % Final  . Eosinophils Absolute 07/08/2017 0.2  0 - 0.7 K/uL Final  . Basophils Relative 07/08/2017 2  % Final  . Basophils Absolute 07/08/2017 0.1  0 - 0.1 K/uL Final    Assessment:  Luis Boyd. is a 71 y.o. male with stage III multiple myeloma. He presented in 02/2013 with left-sided sharp pain. Evaluation revealed wide-spread lytic lesions including fracture of the left 5th rib laterally. CT scans on 02/22/2013 showed innumerable lytic lesions in thoracic spine, sternum, clavicle, scapula, and ribs.   Bone marrow biopsy on 03/13/2013 revealed 15 to 20% plasma cells. Iron stores were absent. Renal function was normal. SPEP on 03/01/2013 revealed a 1.4 gm/dL IgG monoclonal lambda. IgG was 1987.   He  began induction with Revlimid 25 mg a day (3 weeks on and 1 week off) and Decadron (40 mg once a week) on 04/09/2013. SPEP has revealed no monoclonal protein since 07/26/2014 (last check 05/17/2017). IgG was 1362 on 07/26/2014 and 1097 on 12/19/2014.   Lambda free light chains have been monitored: 33.20 (ratio of 1.56) on 09/20/2014, 31.51 (ratio of 1.43) on 12/19/2014, 30.25 (ratio of 1.56) on 05/01/2015, 31.95 (ratio 1.49) on 01/01/2016, 28.02 (ratio 1.29) on 03/25/2016, 31.5 (ratio 1.26) on 05/28/2016, 21.8 (ratio 1.35) on 08/20/2016, and 37.3 (ratio 1.09) on 10/22/2016.  24 hour UPEP on 12/02/2015 revealed no monoclonal protein.  With remission, Revlimid was decreased to 10 mg a day then to 10 mg every other day secondary to issues with renal function and diarrhea.  Current Revlimid is 2.5 mg a day (3 weeks on/1 week off).  Last cycle of Revlimid began on 07/05/2017.  Bone survey on 05/30/2015 revealed the majority of small lytic lesions were stable.  There was possibly new lesion in the distal left clavicle and right mid femur (small) and a possible developing lucency in the proximal left femoral shaft.  The calvarial lesion noted on the prior study was not well seen (? positional).  Bone survey on 11/27/2015 revealed widespread bony lytic lesions consistent with multiple myeloma.  The vast majority of lesions were stable.  A few small lesions were not previously seen.  One lesion was previously obscured by bowel contents.  Two small lesions in the right distal femoral diaphysis appeared new.  Bone survey on 05/25/2016 revealed no evidence of progressive myelomatous involvement of the skeleton.  Bone survey on 05/16/2017 revealed multiple lucent lesions as previously seen. There was no convincing evidence of progression or superimposed abnormality since prior study.  He received Zometa every 3 months (last 02/28/2017). He takes calcium 4 pills/day. He receives B12 monthly (last 06/14/2017).  B12 was 370 on 01/17/2015.  Folate was 17.5 on 09/17/2016.  Abdominal and pelvic CT scan on 07/15/2015 revealed  extensive sigmoid diverticulosis. There was questionable wall thickening of the ascending colon versus artifact from incomplete distention.  Colonoscopy on 11/11/2015 revealed diverticulosis in the sigmoid colon and distal descending colon and ascending colon.  There was a 2 mm polyp in the mid sigmoid colon.  Pathology revealed a hyperplastic polyp, negative for dysplasia or malignancy.  Symptomatically, he has had unexplained nausea and vomiting today.  Exam is stable.  Counts  are good. Chemistries are normal.   Plan: 1.  Labs today:  CBC with diff, CMP. 2.  Continue B12 monthly x 3 (due 07/12/2017) 3.  Hold Revlimid until nausea and vomiting resolve.  Revlimid to continue 3 weeks on/1 week off after current GI symptoms resolve. 4.  RTC in 1 month for MD assessment and labs (CBC with diff, CMP, SPEP).   Honor Loh, NP  07/08/2017, 11:23 AM

## 2017-07-08 NOTE — Progress Notes (Signed)
Patient states he woke up this morning at 4 am throwing up.  States he has not had a bowel movement in three days.

## 2017-07-11 ENCOUNTER — Encounter: Payer: Self-pay | Admitting: Hematology and Oncology

## 2017-07-11 LAB — PROTEIN ELECTROPHORESIS, SERUM
A/G Ratio: 1.1 (ref 0.7–1.7)
Albumin ELP: 3.9 g/dL (ref 2.9–4.4)
Alpha-1-Globulin: 0.2 g/dL (ref 0.0–0.4)
Alpha-2-Globulin: 0.9 g/dL (ref 0.4–1.0)
Beta Globulin: 1.4 g/dL — ABNORMAL HIGH (ref 0.7–1.3)
Gamma Globulin: 1.1 g/dL (ref 0.4–1.8)
Globulin, Total: 3.6 g/dL (ref 2.2–3.9)
Total Protein ELP: 7.5 g/dL (ref 6.0–8.5)

## 2017-07-12 ENCOUNTER — Inpatient Hospital Stay: Payer: Medicare Other | Attending: Hematology and Oncology

## 2017-07-12 DIAGNOSIS — Z79899 Other long term (current) drug therapy: Secondary | ICD-10-CM | POA: Diagnosis not present

## 2017-07-12 DIAGNOSIS — E538 Deficiency of other specified B group vitamins: Secondary | ICD-10-CM | POA: Diagnosis present

## 2017-07-12 MED ORDER — CYANOCOBALAMIN 1000 MCG/ML IJ SOLN
1000.0000 ug | Freq: Once | INTRAMUSCULAR | Status: AC
  Administered 2017-07-12: 1000 ug via INTRAMUSCULAR
  Filled 2017-07-12: qty 1

## 2017-07-14 NOTE — Telephone Encounter (Signed)
Oral Oncology Patient Advocate Encounter  [employer] diagnosis verification form was completed and faxed to [employer] at [phone]  This is the last step from the office needed to secure continued patient access to funding.   [place] Patient Advocate [phone] 07/14/2017 2:08 PM

## 2017-07-20 ENCOUNTER — Other Ambulatory Visit: Payer: Self-pay | Admitting: Hematology and Oncology

## 2017-07-21 ENCOUNTER — Other Ambulatory Visit: Payer: Self-pay | Admitting: Hematology and Oncology

## 2017-07-21 MED ORDER — LENALIDOMIDE 2.5 MG PO CAPS: ORAL_CAPSULE | ORAL | 0 refills | Status: DC

## 2017-08-05 ENCOUNTER — Inpatient Hospital Stay: Payer: Medicare Other | Admitting: Hematology and Oncology

## 2017-08-05 ENCOUNTER — Inpatient Hospital Stay: Payer: Medicare Other

## 2017-08-05 NOTE — Progress Notes (Unsigned)
[facility] day:  08/05/17   Chief Complaint: Luis [name]. is a 71 y.o. male with multiple myeloma who is seen for 1 month assessment.  HPI:  The patient was last seen in the medical oncology clinic on 07/08/2017.  At that time, he was feeling poorly.  He described nausea and vomiting unrelated to Revlimid.  Exam was stable.  Counts were good. Chemistries were normal.  We discussed holding Revlimid until his symptoms resolved.                                                                                                                                                                                                                        Past Medical History:  Diagnosis Date  . Angina pectoris ([place])   . Anxiety   . Arthritis   . CHF (congestive heart failure) ([place])   . Coronary artery disease   . Depression   . Diabetes mellitus without complication [facility])    Patient takes Metformin.  . Diverticulosis   . GERD (gastroesophageal reflux disease)   . Hyperlipidemia   . Hypertension   . Multiple myeloma ([place])   . Multiple myeloma ([place]) 03/27/2015  . Myocardial infarction [facility]) April 2001   widowmaker  . Shortness of breath dyspnea   . Sleep apnea    No CPAP @ present  . Spinal stenosis     Past Surgical History:  Procedure Laterality Date  . CARDIAC CATHETERIZATION    . CARPAL TUNNEL RELEASE    . CATARACT EXTRACTION    . COLONOSCOPY WITH PROPOFOL N/A 11/11/2015   Procedure: COLONOSCOPY WITH PROPOFOL;  Surgeon: Lollie Sails, MD;  Location: [facility] ENDOSCOPY;  Service: Endoscopy;  Laterality: N/A;  . CORONARY ARTERY BYPASS GRAFT    . EYE SURGERY Bilateral    Cataract Extraction  . INGUINAL HERNIA REPAIR    . JOINT REPLACEMENT Right 2008   Right Total Hip Replacement  . PILONIDAL CYST EXCISION    . ROTATOR CUFF REPAIR    . TOTAL HIP ARTHROPLASTY Right   . VENTRAL HERNIA REPAIR N/A 08/15/2015   Procedure: VENTRAL HERNIA REPAIR  WITH MESH ;  Surgeon: Leonie Green, MD;  Location: [facility] ORS;  Service: General;  Laterality: N/A;    Family History  Problem Relation Age of Onset  . Heart disease Father   . Stroke Mother   . Prostate cancer Maternal Grandfather 73    Social History:  reports that he quit smoking about 17 years ago. His smoking use included Cigarettes. He has a 60.00 pack-year smoking history. He quit smokeless tobacco use about 17 years ago. He reports that he does not drink alcohol or use drugs.  He notes that his wife goes off to work.  He states that his wife would like to retire, but that would affect his medication coverage.  He has projects that he likes to do at home.  He does wood working.  His 42nd high school graduation anniversary was in 06/2016.  He has a new grand-daughter, [name].  His wife is retiring today.  The patient is alone today.  Allergies:  Allergies  Allergen Reactions  . Pravachol [Pravastatin]   . Pravastatin Sodium Other (See Comments)  . Statins Other (See Comments)    Muscle aches  . Zocor [Simvastatin] Other (See Comments)    Muscle aches    Current Medications: Current Outpatient Prescriptions  Medication Sig Dispense Refill  . acetaminophen (TYLENOL) 500 MG tablet Take 500 mg by mouth every 6 (six) hours as needed.    . ALPRAZolam (XANAX) 0.25 MG tablet Take 0.25 mg by mouth at bedtime as needed. for sleep  3  . amoxicillin (AMOXIL) 875 MG tablet     . aspirin 81 MG tablet Take 81 mg by mouth daily.     [name] Pollen 500 MG CHEW Chew 1 tablet by mouth daily.    . Calcium Carbonate-Vitamin D 600-400 MG-UNIT chew tablet Chew 4 tablets by mouth daily. Takes them throughout the day for a total of 4    . Cyanocobalamin (VITAMIN B-12 IJ) Inject 1 Dose as directed every 30 (thirty) days.     . ergocalciferol (VITAMIN D2) 50000 UNITS capsule Take 50,000 Units by mouth once a week.    Marland Kitchen KLOR-CON M20 20 MEQ tablet TAKE 1 TABLET (20 MEQ TOTAL) BY MOUTH DAILY.  (Patient not taking: Reported on 06/07/2017) 30 tablet 2  . lenalidomide (REVLIMID) 2.5 MG capsule TAKE 1 CAPSULE (2.'5MG'$ ) BY MOUTH ONCE DAILY FOR 21 DAYS ON THEN 7 DAYSOFF 21 capsule 0  . lisinopril (PRINIVIL,ZESTRIL) 20 MG tablet Take 1 tablet by mouth daily.    Marland Kitchen lovastatin (MEVACOR) 40 MG tablet Take 20 mg by mouth at bedtime. 2 tablets a day    . metFORMIN (GLUCOPHAGE) 500 MG tablet 500 mg daily with breakfast.     . omeprazole (PRILOSEC) 20 MG capsule Take 20 mg by mouth daily.    . traMADol (ULTRAM) 50 MG tablet TAKE 1 TABLET BY MOUTH 3 TIMES A DAY AS NEEDED 60 tablet 3  . Zoledronic Acid (ZOMETA) 4 MG/100ML IVPB Inject into the vein.     No current facility-administered medications for this visit.     Review of Systems:  GENERAL:  Feels "poorly" today.  No fevers or sweats.  Weight stable. PERFORMANCE STATUS (ECOG): 1 HEENT: No visual changes, runny nose, sore throat, mouth sores or tenderness. Lungs:  No shortness of breath or cough. No hemoptysis. Cardiac: No chest pain, palpitations, orthopnea, or PND.  Pre-op echo and stress test good. GI: Nausea and vomiting (see HPI).  No diarrhea, melena or hematochezia. GU: No urgency, frequency, dysuria, or hematuria. Musculoskeletal: Spinal stenosis causes legs aches and hip pain.  No joint pain. No muscle tenderness. Extremities: No pain or swelling. Skin: No rashes or skin changes. Neuro: No headache, numbness or weakness, balance or coordination issues. Injection for spinal stenosis. Endocrine: Diabetes.  No thyroid issues,  hot flashes or night sweats. Psych: No mood changes, depression or anxiety. He is a "night owl". Pain: No focal pain. Review of systems: All other systems reviewed and found to be negative.  Physical Exam: There were no vitals taken for this visit.  GENERAL: Well developed, well nourished, elderly gentleman sitting comfortably in the exam room in no acute distress.  He has a cane at his  side. MENTAL STATUS: Alert and oriented to person, place and time. HEAD: Short white hair. Male pattern baldness. Normocephalic, atraumatic, face symmetric, no Cushingoid features. EYES: Blue eyes. Pupils equal round and reactive to light and accomodation. No conjunctivitis or scleral icterus. ENT: Oropharynx clear without lesion. Tongue normal. Mucous membranes moist.  RESPIRATORY: Clear to auscultation without rales, wheezes or rhonchi. CARDIOVASCULAR: Regular rate and rhythm without murmur, rub or gallop. ABDOMEN: Soft, non-tender, with active bowel sounds, and no hepatosplenomegaly. No guarding or rebound tenderness.  No masses. SKIN: No rashes, ulcers or lesions. EXTREMITIES: No edema, no skin discoloration or tenderness. No palpable cords. LYMPH NODES: No palpable cervical, supraclavicular, axillary or inguinal adenopathy  NEUROLOGICAL: Unremarkable. PSYCH: Appropriate   No visits with results within 3 Day(s) from this visit.  Latest known visit with results is:  Appointment on 07/08/2017  Component Date Value Ref Range Status  . Sodium 07/08/2017 134* 135 - 145 mmol/L Final  . Potassium 07/08/2017 4.8  3.5 - 5.1 mmol/L Final  . Chloride 07/08/2017 101  101 - 111 mmol/L Final  . CO2 07/08/2017 24  22 - 32 mmol/L Final  . Glucose, Bld 07/08/2017 144* 65 - 99 mg/dL Final  . BUN 04/22/1491 25* 6 - 20 mg/dL Final  . Creatinine, Ser 07/08/2017 1.17  0.61 - 1.24 mg/dL Final  . Calcium 43/52/5518 9.2  8.9 - 10.3 mg/dL Final  . Total Protein 07/08/2017 8.3* 6.5 - 8.1 g/dL Final  . Albumin 60/26/2692 4.0  3.5 - 5.0 g/dL Final  . AST 39/05/4972 39  15 - 41 U/L Final  . ALT 07/08/2017 25  17 - 63 U/L Final  . Alkaline Phosphatase 07/08/2017 52  38 - 126 U/L Final  . Total Bilirubin 07/08/2017 0.8  0.3 - 1.2 mg/dL Final  . GFR calc non Af Amer 07/08/2017 >60  >60 mL/min Final  . GFR calc Af Amer 07/08/2017 >60  >60 mL/min Final   Comment: (NOTE) The eGFR has been  calculated using the CKD EPI equation. This calculation has not been validated in all clinical situations. eGFR's persistently <60 mL/min signify possible Chronic Kidney Disease.   . Anion gap 07/08/2017 9  5 - 15 Final  . WBC 07/08/2017 4.9  3.8 - 10.6 K/uL Final  . RBC 07/08/2017 3.63* 4.40 - 5.90 MIL/uL Final  . Hemoglobin 07/08/2017 13.2  13.0 - 18.0 g/dL Final  . HCT 66/65/9660 37.5* 40.0 - 52.0 % Final  . MCV 07/08/2017 103.1* 80.0 - 100.0 fL Final  . MCH 07/08/2017 36.4* 26.0 - 34.0 pg Final  . MCHC 07/08/2017 35.3  32.0 - 36.0 g/dL Final  . RDW 72/08/3747 13.6  11.5 - 14.5 % Final  . Platelets 07/08/2017 196  150 - 440 K/uL Final  . Neutrophils Relative % 07/08/2017 61  % Final  . Neutro Abs 07/08/2017 3.0  1.4 - 6.5 K/uL Final  . Lymphocytes Relative 07/08/2017 21  % Final  . Lymphs Abs 07/08/2017 1.0  1.0 - 3.6 K/uL Final  . Monocytes Relative 07/08/2017 11  % Final  . Monocytes Absolute 07/08/2017  0.5  0.2 - 1.0 K/uL Final  . Eosinophils Relative 07/08/2017 5  % Final  . Eosinophils Absolute 07/08/2017 0.2  0 - 0.7 K/uL Final  . Basophils Relative 07/08/2017 2  % Final  . Basophils Absolute 07/08/2017 0.1  0 - 0.1 K/uL Final  . Total Protein ELP 07/08/2017 7.5  6.0 - 8.5 g/dL Final  . Albumin ELP 07/08/2017 3.9  2.9 - 4.4 g/dL Final  . Alpha-1-Globulin 07/08/2017 0.2  0.0 - 0.4 g/dL Final  . Alpha-2-Globulin 07/08/2017 0.9  0.4 - 1.0 g/dL Final  . Beta Globulin 07/08/2017 1.4* 0.7 - 1.3 g/dL Final  . Gamma Globulin 07/08/2017 1.1  0.4 - 1.8 g/dL Final  . M-Spike, % 07/08/2017 Not Observed  Not Observed g/dL Final  . SPE Interp. 07/08/2017 Comment   Final   Comment: (NOTE) The SPE pattern appears essentially unremarkable. Evidence of monoclonal protein is not apparent. Performed At: [facility] [place], [place] [id number] Lindon Romp MD ZS:[id number]   . Comment 07/08/2017 Comment   Final   Comment: (NOTE) Protein electrophoresis scan  will follow via computer, mail, or courier delivery.   Marland Kitchen GLOBULIN, TOTAL 07/08/2017 3.6  2.2 - 3.9 g/dL Corrected  . A/G Ratio 07/08/2017 1.1  0.7 - 1.7 Corrected    Assessment:  Heaton Sarin. is a 71 y.o. male with stage III multiple myeloma. He presented in 02/2013 with left-sided sharp pain. Evaluation revealed wide-spread lytic lesions including fracture of the left 5th rib laterally. CT scans on 02/22/2013 showed innumerable lytic lesions in thoracic spine, sternum, clavicle, scapula, and ribs.   Bone marrow biopsy on 03/13/2013 revealed 15 to 20% plasma cells. Iron stores were absent. Renal function was normal. SPEP on 03/01/2013 revealed a 1.4 gm/dL IgG monoclonal lambda. IgG was 1987.   He began induction with Revlimid 25 mg a day (3 weeks on and 1 week off) and Decadron (40 mg once a week) on 04/09/2013. SPEP has revealed no monoclonal protein since 07/26/2014 (last check 07/08/2017). IgG was 1362 on 07/26/2014 and 1097 on 12/19/2014.   Lambda free light chains have been monitored: 33.20 (ratio of 1.56) on 09/20/2014, 31.51 (ratio of 1.43) on 12/19/2014, 30.25 (ratio of 1.56) on 05/01/2015, 31.95 (ratio 1.49) on 01/01/2016, 28.02 (ratio 1.29) on 03/25/2016, 31.5 (ratio 1.26) on 05/28/2016, 21.8 (ratio 1.35) on 08/20/2016, and 37.3 (ratio 1.09) on 10/22/2016.  24 hour UPEP on 12/02/2015 revealed no monoclonal protein.  With remission, Revlimid was decreased to 10 mg a day then to 10 mg every other day secondary to issues with renal function and diarrhea.  Current Revlimid is 2.5 mg a day (3 weeks on/1 week off).  Last cycle of Revlimid began on 07/05/2017.  Bone survey on 05/30/2015 revealed the majority of small lytic lesions were stable.  There was possibly new lesion in the distal left clavicle and right mid femur (small) and a possible developing lucency in the proximal left femoral shaft.  The calvarial lesion noted on the prior study was not well seen (? positional).   Bone survey on 11/27/2015 revealed widespread bony lytic lesions consistent with multiple myeloma.  The vast majority of lesions were stable.  A few small lesions were not previously seen.  One lesion was previously obscured by bowel contents.  Two small lesions in the right distal femoral diaphysis appeared new.  Bone survey on 05/25/2016 revealed no evidence of progressive myelomatous involvement of the skeleton.  Bone survey on 05/16/2017 revealed multiple  lucent lesions as previously seen. There was no convincing evidence of progression or superimposed abnormality since prior study.  He received Zometa every 3 months (last 02/28/2017). He takes calcium 4 pills/day. He receives B12 monthly (last 07/12/2017). B12 was 370 on 01/17/2015.  Folate was 17.5 on 09/17/2016.  Abdominal and pelvic CT scan on 07/15/2015 revealed  extensive sigmoid diverticulosis. There was questionable wall thickening of the ascending colon versus artifact from incomplete distention.  Colonoscopy on 11/11/2015 revealed diverticulosis in the sigmoid colon and distal descending colon and ascending colon.  There was a 2 mm polyp in the mid sigmoid colon.  Pathology revealed a hyperplastic polyp, negative for dysplasia or malignancy.  Symptomatically, he has had unexplained nausea and vomiting today.  Exam is stable.  Counts are good. Chemistries are normal.   Plan: 1.  Labs today:  CBC with diff, CMP, Mg, SPEP, folate.  2.  Continue B12 monthly x 3 (due 08/09/2017) 3.  Hold Revlimid until nausea and vomiting resolve.  Revlimid to continue 3 weeks on/1 week off after current GI symptoms resolve. 4.  RTC in 1 month for MD assessment and labs (CBC with diff, CMP).   Lequita Asal, MD  08/05/2017, 2:44 AM

## 2017-08-09 ENCOUNTER — Encounter: Payer: Self-pay | Admitting: Hematology and Oncology

## 2017-08-09 ENCOUNTER — Inpatient Hospital Stay (HOSPITAL_BASED_OUTPATIENT_CLINIC_OR_DEPARTMENT_OTHER): Payer: Medicare Other | Admitting: Hematology and Oncology

## 2017-08-09 ENCOUNTER — Other Ambulatory Visit: Payer: Self-pay | Admitting: *Deleted

## 2017-08-09 ENCOUNTER — Inpatient Hospital Stay: Payer: Medicare Other | Attending: Hematology and Oncology

## 2017-08-09 ENCOUNTER — Inpatient Hospital Stay: Payer: Medicare Other

## 2017-08-09 VITALS — BP 134/76 | HR 64 | Temp 98.1°F | Resp 18 | Wt 210.4 lb

## 2017-08-09 DIAGNOSIS — G4730 Sleep apnea, unspecified: Secondary | ICD-10-CM

## 2017-08-09 DIAGNOSIS — K219 Gastro-esophageal reflux disease without esophagitis: Secondary | ICD-10-CM

## 2017-08-09 DIAGNOSIS — I2510 Atherosclerotic heart disease of native coronary artery without angina pectoris: Secondary | ICD-10-CM | POA: Insufficient documentation

## 2017-08-09 DIAGNOSIS — I25119 Atherosclerotic heart disease of native coronary artery with unspecified angina pectoris: Secondary | ICD-10-CM

## 2017-08-09 DIAGNOSIS — M4800 Spinal stenosis, site unspecified: Secondary | ICD-10-CM | POA: Diagnosis not present

## 2017-08-09 DIAGNOSIS — R630 Anorexia: Secondary | ICD-10-CM | POA: Insufficient documentation

## 2017-08-09 DIAGNOSIS — C9000 Multiple myeloma not having achieved remission: Secondary | ICD-10-CM | POA: Insufficient documentation

## 2017-08-09 DIAGNOSIS — M129 Arthropathy, unspecified: Secondary | ICD-10-CM | POA: Diagnosis not present

## 2017-08-09 DIAGNOSIS — Z8042 Family history of malignant neoplasm of prostate: Secondary | ICD-10-CM | POA: Insufficient documentation

## 2017-08-09 DIAGNOSIS — Z79899 Other long term (current) drug therapy: Secondary | ICD-10-CM

## 2017-08-09 DIAGNOSIS — E538 Deficiency of other specified B group vitamins: Secondary | ICD-10-CM

## 2017-08-09 DIAGNOSIS — E785 Hyperlipidemia, unspecified: Secondary | ICD-10-CM | POA: Diagnosis not present

## 2017-08-09 DIAGNOSIS — Z7982 Long term (current) use of aspirin: Secondary | ICD-10-CM | POA: Insufficient documentation

## 2017-08-09 DIAGNOSIS — F329 Major depressive disorder, single episode, unspecified: Secondary | ICD-10-CM

## 2017-08-09 DIAGNOSIS — I252 Old myocardial infarction: Secondary | ICD-10-CM | POA: Insufficient documentation

## 2017-08-09 DIAGNOSIS — R0602 Shortness of breath: Secondary | ICD-10-CM | POA: Diagnosis not present

## 2017-08-09 DIAGNOSIS — Z86010 Personal history of colonic polyps: Secondary | ICD-10-CM

## 2017-08-09 DIAGNOSIS — R634 Abnormal weight loss: Secondary | ICD-10-CM | POA: Insufficient documentation

## 2017-08-09 DIAGNOSIS — Z87891 Personal history of nicotine dependence: Secondary | ICD-10-CM | POA: Insufficient documentation

## 2017-08-09 DIAGNOSIS — F419 Anxiety disorder, unspecified: Secondary | ICD-10-CM | POA: Insufficient documentation

## 2017-08-09 DIAGNOSIS — I509 Heart failure, unspecified: Secondary | ICD-10-CM

## 2017-08-09 DIAGNOSIS — R5383 Other fatigue: Secondary | ICD-10-CM | POA: Insufficient documentation

## 2017-08-09 DIAGNOSIS — E119 Type 2 diabetes mellitus without complications: Secondary | ICD-10-CM

## 2017-08-09 DIAGNOSIS — Z638 Other specified problems related to primary support group: Secondary | ICD-10-CM

## 2017-08-09 DIAGNOSIS — I10 Essential (primary) hypertension: Secondary | ICD-10-CM | POA: Diagnosis not present

## 2017-08-09 DIAGNOSIS — Z8719 Personal history of other diseases of the digestive system: Secondary | ICD-10-CM | POA: Insufficient documentation

## 2017-08-09 DIAGNOSIS — D709 Neutropenia, unspecified: Secondary | ICD-10-CM | POA: Insufficient documentation

## 2017-08-09 LAB — CBC WITH DIFFERENTIAL/PLATELET
Basophils Absolute: 0 10*3/uL (ref 0–0.1)
Basophils Relative: 0 %
Eosinophils Absolute: 0.2 10*3/uL (ref 0–0.7)
Eosinophils Relative: 7 %
HCT: 36.7 % — ABNORMAL LOW (ref 40.0–52.0)
Hemoglobin: 12.9 g/dL — ABNORMAL LOW (ref 13.0–18.0)
Lymphocytes Relative: 35 %
Lymphs Abs: 1 10*3/uL (ref 1.0–3.6)
MCH: 36 pg — ABNORMAL HIGH (ref 26.0–34.0)
MCHC: 35.2 g/dL (ref 32.0–36.0)
MCV: 102.3 fL — ABNORMAL HIGH (ref 80.0–100.0)
Monocytes Absolute: 0.3 10*3/uL (ref 0.2–1.0)
Monocytes Relative: 12 %
Neutro Abs: 1.3 10*3/uL — ABNORMAL LOW (ref 1.4–6.5)
Neutrophils Relative %: 46 %
Platelets: 177 10*3/uL (ref 150–440)
RBC: 3.59 MIL/uL — ABNORMAL LOW (ref 4.40–5.90)
RDW: 13.2 % (ref 11.5–14.5)
WBC: 2.9 10*3/uL — ABNORMAL LOW (ref 3.8–10.6)

## 2017-08-09 LAB — COMPREHENSIVE METABOLIC PANEL
ALT: 23 U/L (ref 17–63)
AST: 31 U/L (ref 15–41)
Albumin: 3.7 g/dL (ref 3.5–5.0)
Alkaline Phosphatase: 51 U/L (ref 38–126)
Anion gap: 8 (ref 5–15)
BUN: 15 mg/dL (ref 6–20)
CO2: 23 mmol/L (ref 22–32)
Calcium: 9.9 mg/dL (ref 8.9–10.3)
Chloride: 103 mmol/L (ref 101–111)
Creatinine, Ser: 0.95 mg/dL (ref 0.61–1.24)
GFR calc Af Amer: >60 mL/min (ref >60)
GFR calc non Af Amer: >60 mL/min (ref >60)
Glucose, Bld: 190 mg/dL — ABNORMAL HIGH (ref 65–99)
Potassium: 4.3 mmol/L (ref 3.5–5.1)
Sodium: 134 mmol/L — ABNORMAL LOW (ref 135–145)
Total Bilirubin: 0.5 mg/dL (ref 0.3–1.2)
Total Protein: 7.8 g/dL (ref 6.5–8.1)

## 2017-08-09 LAB — FOLATE: Folate: 18.1 ng/mL (ref >5.9)

## 2017-08-09 LAB — MAGNESIUM: Magnesium: 1.6 mg/dL — ABNORMAL LOW (ref 1.7–2.4)

## 2017-08-09 MED ORDER — CYANOCOBALAMIN 1000 MCG/ML IJ SOLN
1000.0000 ug | Freq: Once | INTRAMUSCULAR | Status: AC
Start: 2017-08-09 — End: 2017-08-09
  Administered 2017-08-09: 1000 ug via INTRAMUSCULAR
  Filled 2017-08-09: qty 1

## 2017-08-09 NOTE — Progress Notes (Signed)
Patient states his appetite has decreased to about 25%.  States he stays sleepy all the time.  Having bowel movements only about twice a week.  States he feels very tired.  His wife has been sick since her retirement and he has been out of town with her while she was hospitalized at [facility].  Patient started his Revlimid on 07-30-17.

## 2017-08-09 NOTE — Progress Notes (Signed)
[facility] day:  08/09/17   Chief Complaint: Luis [name]. is a 71 y.o. male with multiple myeloma who is seen for 1 month assessment on Revlimid.  HPI:  The patient was last seen in the medical oncology clinic on 07/08/2017.  At that time, he was feeling poorly.  He described nausea and vomiting unrelated to Revlimid.  Exam was stable.  Counts were good. Chemistries were normal.  We discussed holding Revlimid until his symptoms resolved.  Patient missed last appointment. Patient's wife "fainted", leading her to be hospitalized at [facility]. She had an intestinal mass removed. Patient had complications that led to angioplasty with stent placement.   Symptomatically, patient is "tired". Patient has extensive situational stress at the present. He is not eating well. He has lost 5 pounds since his last visit.  He has had some diarrhea. He denies significant pain today; 0/10 reported.  He denies any interval infections.   He has continued on his Revlimid as prescribed. Last Revlimid cycle started on 08/04/2017.                                                                                                                                                                                                                       Past Medical History:  Diagnosis Date  . Angina pectoris ([place])   . Anxiety   . Arthritis   . CHF (congestive heart failure) ([place])   . Coronary artery disease   . Depression   . Diabetes mellitus without complication [facility])    Patient takes Metformin.  . Diverticulosis   . GERD (gastroesophageal reflux disease)   . Hyperlipidemia   . Hypertension   . Multiple myeloma ([place])   . Multiple myeloma ([place]) 03/27/2015  . Myocardial infarction [facility]) April 2001   widowmaker  . Shortness of breath dyspnea   . Sleep apnea    No CPAP @ present  . Spinal stenosis     Past Surgical History:  Procedure Laterality Date  . CARDIAC  CATHETERIZATION    . CARPAL TUNNEL RELEASE    . CATARACT EXTRACTION    . COLONOSCOPY WITH PROPOFOL N/A 11/11/2015   Procedure: COLONOSCOPY WITH PROPOFOL;  Surgeon: Lollie Sails, MD;  Location: [facility] ENDOSCOPY;  Service: Endoscopy;  Laterality: N/A;  . CORONARY ARTERY BYPASS GRAFT    . EYE SURGERY Bilateral    Cataract Extraction  . INGUINAL HERNIA REPAIR    . JOINT  REPLACEMENT Right 2008   Right Total Hip Replacement  . PILONIDAL CYST EXCISION    . ROTATOR CUFF REPAIR    . TOTAL HIP ARTHROPLASTY Right   . VENTRAL HERNIA REPAIR N/A 08/15/2015   Procedure: VENTRAL HERNIA REPAIR WITH MESH ;  Surgeon: Leonie Green, MD;  Location: [facility] ORS;  Service: General;  Laterality: N/A;    Family History  Problem Relation Age of Onset  . Heart disease Father   . Stroke Mother   . Prostate cancer Maternal Grandfather 15    Social History:  reports that he quit smoking about 17 years ago. His smoking use included Cigarettes. He has a 60.00 pack-year smoking history. He quit smokeless tobacco use about 17 years ago. He reports that he does not drink alcohol or use drugs.  He notes that his wife goes off to work.  He states that his wife would like to retire, but that would affect his medication coverage.  He has projects that he likes to do at home.  He does wood working.  His 42nd high school graduation anniversary was in 06/2016.  He has a new grand-daughter, [name].  His wife is retiring today.  His wife was recently hospitalized.  The patient is alone today.  Allergies:  Allergies  Allergen Reactions  . Pravachol [Pravastatin]   . Pravastatin Sodium Other (See Comments)  . Statins Other (See Comments)    Muscle aches  . Zocor [Simvastatin] Other (See Comments)    Muscle aches    Current Medications: Current Outpatient Prescriptions  Medication Sig Dispense Refill  . acetaminophen (TYLENOL) 500 MG tablet Take 500 mg by mouth every 6 (six) hours as needed.    . ALPRAZolam  (XANAX) 0.25 MG tablet Take 0.25 mg by mouth at bedtime as needed. for sleep  3  . amoxicillin (AMOXIL) 875 MG tablet     . aspirin 81 MG tablet Take 81 mg by mouth daily.     [name] Pollen 500 MG CHEW Chew 1 tablet by mouth daily.    . Calcium Carbonate-Vitamin D 600-400 MG-UNIT chew tablet Chew 4 tablets by mouth daily. Takes them throughout the day for a total of 4    . Cyanocobalamin (VITAMIN B-12 IJ) Inject 1 Dose as directed every 30 (thirty) days.     . ergocalciferol (VITAMIN D2) 50000 UNITS capsule Take 50,000 Units by mouth once a week.    . lenalidomide (REVLIMID) 2.5 MG capsule TAKE 1 CAPSULE (2.5MG) BY MOUTH ONCE DAILY FOR 21 DAYS ON THEN 7 DAYSOFF 21 capsule 0  . lisinopril (PRINIVIL,ZESTRIL) 20 MG tablet Take 1 tablet by mouth daily.    Marland Kitchen lovastatin (MEVACOR) 40 MG tablet Take 20 mg by mouth at bedtime. 2 tablets a day    . metFORMIN (GLUCOPHAGE) 500 MG tablet 500 mg daily with breakfast.     . omeprazole (PRILOSEC) 20 MG capsule Take 20 mg by mouth daily.    . traMADol (ULTRAM) 50 MG tablet TAKE 1 TABLET BY MOUTH 3 TIMES A DAY AS NEEDED 60 tablet 3  . Zoledronic Acid (ZOMETA) 4 MG/100ML IVPB Inject into the vein.    Marland Kitchen KLOR-CON M20 20 MEQ tablet TAKE 1 TABLET (20 MEQ TOTAL) BY MOUTH DAILY. (Patient not taking: Reported on 06/07/2017) 30 tablet 2   No current facility-administered medications for this visit.     Review of Systems:  GENERAL:  Feels "tired" today.  No fevers or sweats.  Weight down 5 pounds. PERFORMANCE STATUS (  ECOG): 1 HEENT: No visual changes, runny nose, sore throat, mouth sores or tenderness. Lungs:  No shortness of breath or cough. No hemoptysis. Cardiac: No chest pain, palpitations, orthopnea, or PND.  Pre-op echo and stress test good. GI: Diarrhea.  No nausea, vomiting, melena or hematochezia. GU: No urgency, frequency, dysuria, or hematuria. Musculoskeletal: Spinal stenosis causes legs aches and hip pain.  No joint pain. No muscle  tenderness. Extremities: No pain or swelling. Skin: No rashes or skin changes. Neuro: No headache, numbness or weakness, balance or coordination issues. Injection for spinal stenosis. Endocrine: Diabetes.  No thyroid issues, hot flashes or night sweats. Psych: Emotional stress (see HPI).  No mood changes, depression or anxiety. He is a "night owl". Pain: No focal pain. Review of systems: All other systems reviewed and found to be negative.  Physical Exam: Blood pressure 134/76, pulse 64, temperature 98.1 F (36.7 C), temperature source Tympanic, resp. rate 18, weight 210 lb 7 oz (95.5 kg).  GENERAL: Well developed, well nourished, fatigued appearing elderly gentleman sitting comfortably in the exam room in no acute distress.  He has a cane at his side. MENTAL STATUS: Alert and oriented to person, place and time. HEAD: Short white hair. Male pattern baldness. Normocephalic, atraumatic, face symmetric, no Cushingoid features. EYES: Blue eyes. Pupils equal round and reactive to light and accomodation. No conjunctivitis or scleral icterus. ENT: Oropharynx clear without lesion. Tongue normal. Mucous membranes moist.  RESPIRATORY: Clear to auscultation without rales, wheezes or rhonchi. CARDIOVASCULAR: Regular rate and rhythm without murmur, rub or gallop. ABDOMEN: Soft, non-tender, with active bowel sounds, and no hepatosplenomegaly. No guarding or rebound tenderness.  No masses. SKIN: No rashes, ulcers or lesions. EXTREMITIES: No edema, no skin discoloration or tenderness. No palpable cords. LYMPH NODES: No palpable cervical, supraclavicular, axillary or inguinal adenopathy  NEUROLOGICAL: Unremarkable. PSYCH: Appropriate   Appointment on 08/09/2017  Component Date Value Ref Range Status  . WBC 08/09/2017 2.9* 3.8 - 10.6 K/uL Final  . RBC 08/09/2017 3.59* 4.40 - 5.90 MIL/uL Final  . Hemoglobin 08/09/2017 12.9* 13.0 - 18.0 g/dL Final  . HCT 08/09/2017 36.7* 40.0 -  52.0 % Final  . MCV 08/09/2017 102.3* 80.0 - 100.0 fL Final  . MCH 08/09/2017 36.0* 26.0 - 34.0 pg Final  . MCHC 08/09/2017 35.2  32.0 - 36.0 g/dL Final  . RDW 08/09/2017 13.2  11.5 - 14.5 % Final  . Platelets 08/09/2017 177  150 - 440 K/uL Final  . Neutrophils Relative % 08/09/2017 46  % Final  . Neutro Abs 08/09/2017 1.3* 1.4 - 6.5 K/uL Final  . Lymphocytes Relative 08/09/2017 35  % Final  . Lymphs Abs 08/09/2017 1.0  1.0 - 3.6 K/uL Final  . Monocytes Relative 08/09/2017 12  % Final  . Monocytes Absolute 08/09/2017 0.3  0.2 - 1.0 K/uL Final  . Eosinophils Relative 08/09/2017 7  % Final  . Eosinophils Absolute 08/09/2017 0.2  0 - 0.7 K/uL Final  . Basophils Relative 08/09/2017 0  % Final  . Basophils Absolute 08/09/2017 0.0  0 - 0.1 K/uL Final  . Sodium 08/09/2017 134* 135 - 145 mmol/L Final  . Potassium 08/09/2017 4.3  3.5 - 5.1 mmol/L Final  . Chloride 08/09/2017 103  101 - 111 mmol/L Final  . CO2 08/09/2017 23  22 - 32 mmol/L Final  . Glucose, Bld 08/09/2017 190* 65 - 99 mg/dL Final  . BUN 08/09/2017 15  6 - 20 mg/dL Final  . Creatinine, Ser 08/09/2017 0.95  0.61 -  1.24 mg/dL Final  . Calcium 08/09/2017 9.9  8.9 - 10.3 mg/dL Final  . Total Protein 08/09/2017 7.8  6.5 - 8.1 g/dL Final  . Albumin 08/09/2017 3.7  3.5 - 5.0 g/dL Final  . AST 08/09/2017 31  15 - 41 U/L Final  . ALT 08/09/2017 23  17 - 63 U/L Final  . Alkaline Phosphatase 08/09/2017 51  38 - 126 U/L Final  . Total Bilirubin 08/09/2017 0.5  0.3 - 1.2 mg/dL Final  . GFR calc non Af Amer 08/09/2017 >60  >60 mL/min Final  . GFR calc Af Amer 08/09/2017 >60  >60 mL/min Final   Comment: (NOTE) The eGFR has been calculated using the CKD EPI equation. This calculation has not been validated in all clinical situations. eGFR's persistently <60 mL/min signify possible Chronic Kidney Disease.   . Anion gap 08/09/2017 8  5 - 15 Final  . Magnesium 08/09/2017 1.6* 1.7 - 2.4 mg/dL Final    Assessment:  Luis [name]. is a 71  y.o. male with stage III multiple myeloma. He presented in 02/2013 with left-sided sharp pain. Evaluation revealed wide-spread lytic lesions including fracture of the left 5th rib laterally. CT scans on 02/22/2013 showed innumerable lytic lesions in thoracic spine, sternum, clavicle, scapula, and ribs.   Bone marrow biopsy on 03/13/2013 revealed 15 to 20% plasma cells. Iron stores were absent. Renal function was normal. SPEP on 03/01/2013 revealed a 1.4 gm/dL IgG monoclonal lambda. IgG was 1987.   He began induction with Revlimid 25 mg a day (3 weeks on and 1 week off) and Decadron (40 mg once a week) on 04/09/2013. SPEP has revealed no monoclonal protein since 07/26/2014 (last check 07/08/2017). IgG was 1362 on 07/26/2014 and 1097 on 12/19/2014.   Lambda free light chains have been monitored: 33.20 (ratio of 1.56) on 09/20/2014, 31.51 (ratio of 1.43) on 12/19/2014, 30.25 (ratio of 1.56) on 05/01/2015, 31.95 (ratio 1.49) on 01/01/2016, 28.02 (ratio 1.29) on 03/25/2016, 31.5 (ratio 1.26) on 05/28/2016, 21.8 (ratio 1.35) on 08/20/2016, and 37.3 (ratio 1.09) on 10/22/2016.  24 hour UPEP on 12/02/2015 revealed no monoclonal protein.  With remission, Revlimid was decreased to 10 mg a day then to 10 mg every other day secondary to issues with renal function and diarrhea.  Current Revlimid is 2.5 mg a day (3 weeks on/1 week off).  Last cycle of Revlimid began on 08/04/2017.  Bone survey on 05/30/2015 revealed the majority of small lytic lesions were stable.  There was possibly new lesion in the distal left clavicle and right mid femur (small) and a possible developing lucency in the proximal left femoral shaft.  The calvarial lesion noted on the prior study was not well seen (? positional).  Bone survey on 11/27/2015 revealed widespread bony lytic lesions consistent with multiple myeloma.  The vast majority of lesions were stable.  A few small lesions were not previously seen.  One lesion was  previously obscured by bowel contents.  Two small lesions in the right distal femoral diaphysis appeared new.  Bone survey on 05/25/2016 revealed no evidence of progressive myelomatous involvement of the skeleton.  Bone survey on 05/16/2017 revealed multiple lucent lesions as previously seen. There was no convincing evidence of progression or superimposed abnormality since prior study.  He received Zometa every 3 months (last 02/28/2017). He takes calcium 4 pills/day. He receives B12 monthly (last 07/12/2017). B12 was 370 on 01/17/2015.  Folate was 17.5 on 09/17/2016.  Abdominal and pelvic CT scan on 07/15/2015 revealed  extensive sigmoid diverticulosis. There was questionable wall thickening of the ascending colon versus artifact from incomplete distention.  Colonoscopy on 11/11/2015 revealed diverticulosis in the sigmoid colon and distal descending colon and ascending colon.  There was a 2 mm polyp in the mid sigmoid colon.  Pathology revealed a hyperplastic polyp, negative for dysplasia or malignancy.  Symptomatically, he is tired. Patient under a lot of stress due to the health of his wife.  He has had some intermittent diarrhea. Appetite is decreased. He has lost 5 pounds. Exam is stable. WBC is 2900 with an ANC of 1300. Hemoglobin 12.9, hematocrit 36.7, platelets 177,000.   Plan: 1.  Labs today:  CBC with diff, CMP, Mg, SPEP, folate. 2.  Continue B12 monthly x 3 3.  Hold Revlimid due to neutropenia; WBC 2900 (ANC 1300).  4.  RTC on 08/19/2017 for labs (CBC with diff, CMP) 5.  RTC in 4 weeks from 08/19/2017 for MD assessment and labs (CBC with diff, CMP).   Honor Loh, NP  08/09/2017, 12:37 PM   I saw and evaluated the patient, participating in the key portions of the service and reviewing pertinent diagnostic studies and records.  I reviewed the nurse practitioner's note and agree with the findings and the plan.  The assessment and plan were discussed with the patient.   A few questions  were asked by the patient and answered.   Nolon Stalls, MD 08/09/2017, 12:37 PM

## 2017-08-10 LAB — PROTEIN ELECTROPHORESIS, SERUM
A/G Ratio: 0.8 (ref 0.7–1.7)
Albumin ELP: 3.3 g/dL (ref 2.9–4.4)
Alpha-1-Globulin: 0.2 g/dL (ref 0.0–0.4)
Alpha-2-Globulin: 0.8 g/dL (ref 0.4–1.0)
Beta Globulin: 1.6 g/dL — ABNORMAL HIGH (ref 0.7–1.3)
Gamma Globulin: 1.3 g/dL (ref 0.4–1.8)
Globulin, Total: 3.9 g/dL (ref 2.2–3.9)
Total Protein ELP: 7.2 g/dL (ref 6.0–8.5)

## 2017-08-15 ENCOUNTER — Other Ambulatory Visit: Payer: Self-pay | Admitting: Hematology and Oncology

## 2017-08-16 ENCOUNTER — Other Ambulatory Visit: Payer: Self-pay | Admitting: Hematology and Oncology

## 2017-08-16 ENCOUNTER — Other Ambulatory Visit: Payer: Self-pay | Admitting: Pharmacist

## 2017-08-16 DIAGNOSIS — C9000 Multiple myeloma not having achieved remission: Secondary | ICD-10-CM

## 2017-08-16 MED ORDER — LENALIDOMIDE 2.5 MG PO CAPS: ORAL_CAPSULE | ORAL | 0 refills | Status: DC

## 2017-08-16 NOTE — Progress Notes (Signed)
Oral Chemotherapy Pharmacist Encounter  Prescription send to [facility]. Patients uses [facility] to fill his Revlimib. Prescription redirected to [facility].   REMs #: [id number] Expiration: 09/15/17  Darl Pikes, PharmD, BCPS Hematology/Oncology Clinical Pharmacist [facility] [phone]  08/16/2017 4:01 PM

## 2017-08-16 NOTE — Telephone Encounter (Signed)
Would you write a prescription for this today?  Thanks!

## 2017-08-19 ENCOUNTER — Telehealth: Payer: Self-pay | Admitting: *Deleted

## 2017-08-19 ENCOUNTER — Inpatient Hospital Stay: Payer: Medicare Other

## 2017-08-19 DIAGNOSIS — C9000 Multiple myeloma not having achieved remission: Secondary | ICD-10-CM | POA: Diagnosis not present

## 2017-08-19 LAB — CBC WITH DIFFERENTIAL/PLATELET
Basophils Absolute: 0.1 10*3/uL (ref 0–0.1)
Basophils Relative: 2 %
Eosinophils Absolute: 0.2 10*3/uL (ref 0–0.7)
Eosinophils Relative: 5 %
HCT: 37.1 % — ABNORMAL LOW (ref 40.0–52.0)
Hemoglobin: 12.9 g/dL — ABNORMAL LOW (ref 13.0–18.0)
Lymphocytes Relative: 34 %
Lymphs Abs: 1.2 10*3/uL (ref 1.0–3.6)
MCH: 35.7 pg — ABNORMAL HIGH (ref 26.0–34.0)
MCHC: 34.6 g/dL (ref 32.0–36.0)
MCV: 103.2 fL — ABNORMAL HIGH (ref 80.0–100.0)
Monocytes Absolute: 0.5 10*3/uL (ref 0.2–1.0)
Monocytes Relative: 14 %
Neutro Abs: 1.5 10*3/uL (ref 1.4–6.5)
Neutrophils Relative %: 45 %
Platelets: 161 10*3/uL (ref 150–440)
RBC: 3.6 MIL/uL — ABNORMAL LOW (ref 4.40–5.90)
RDW: 13 % (ref 11.5–14.5)
WBC: 3.4 10*3/uL — ABNORMAL LOW (ref 3.8–10.6)

## 2017-08-19 LAB — COMPREHENSIVE METABOLIC PANEL
ALT: 19 U/L (ref 17–63)
AST: 30 U/L (ref 15–41)
Albumin: 3.7 g/dL (ref 3.5–5.0)
Alkaline Phosphatase: 44 U/L (ref 38–126)
Anion gap: 8 (ref 5–15)
BUN: 16 mg/dL (ref 6–20)
CO2: 25 mmol/L (ref 22–32)
Calcium: 9.1 mg/dL (ref 8.9–10.3)
Chloride: 102 mmol/L (ref 101–111)
Creatinine, Ser: 0.92 mg/dL (ref 0.61–1.24)
GFR calc Af Amer: >60 mL/min (ref >60)
GFR calc non Af Amer: >60 mL/min (ref >60)
Glucose, Bld: 132 mg/dL — ABNORMAL HIGH (ref 65–99)
Potassium: 4.4 mmol/L (ref 3.5–5.1)
Sodium: 135 mmol/L (ref 135–145)
Total Bilirubin: 0.6 mg/dL (ref 0.3–1.2)
Total Protein: 7.6 g/dL (ref 6.5–8.1)

## 2017-08-19 NOTE — Telephone Encounter (Addendum)
Patient called to inquire if he is to restart his Revlimid.  Per Dr. Mike Gip, patient can start back today.  Patient verbalized understanding.

## 2017-08-19 NOTE — Telephone Encounter (Signed)
Attempted to call patient to inform him that his labs all are good.  No answer and no availability to LVM.

## 2017-08-19 NOTE — Telephone Encounter (Signed)
-----   Message from Lequita Asal, MD sent at 08/19/2017 11:59 AM EDT ----- Regarding: Please call patient  Blood counts look good.  M  ----- Message ----- From: Interface, Lab In [place] Sent: 08/19/2017  11:03 AM To: Lequita Asal, MD

## 2017-08-29 ENCOUNTER — Telehealth: Payer: Self-pay | Admitting: Hematology and Oncology

## 2017-08-29 NOTE — Telephone Encounter (Signed)
Oral Oncology Patient Advocate Encounter   Called patient to have him come by to sign paper work for [facility] [place]. He will be by today to sign. I originally had put a note on his appt to please ask him to see me. Did not see him at that time.    [place] Patient Advocate [phone] 08/29/2017 10:00 AM

## 2017-08-29 NOTE — Telephone Encounter (Signed)
Oral Oncology Patient Advocate Encounter  Patient came by to sign the form for [employer] to get his copay assistance started. Patient had never sent it in. It was put on hold waiting for his signature.  Will call to make sure they received.   [place] Patient Advocate [phone] 08/29/2017 4:45 PM

## 2017-08-31 NOTE — Telephone Encounter (Signed)
Oral Oncology Patient Advocate Encounter  Was successful in securing patient an $ 7500.00 grant from [place] to provide copayment coverage for his Revlimid.  This will keep the out of pocket expense at $0.    I have spoken with the patient.    The billing information is as follows and has been shared with [facility]  Member HM:[id number] Group ID: [id number] RxBin: [id number] WSF:[id number] Dates of Eligibility: 08/08/2017 through 08/07/2018

## 2017-09-06 ENCOUNTER — Other Ambulatory Visit: Payer: PRIVATE HEALTH INSURANCE

## 2017-09-06 ENCOUNTER — Inpatient Hospital Stay: Payer: Medicare Other

## 2017-09-06 ENCOUNTER — Ambulatory Visit: Payer: PRIVATE HEALTH INSURANCE | Admitting: Hematology and Oncology

## 2017-09-09 ENCOUNTER — Inpatient Hospital Stay: Payer: Medicare Other | Attending: Hematology and Oncology

## 2017-09-09 ENCOUNTER — Inpatient Hospital Stay: Payer: Medicare Other

## 2017-09-09 ENCOUNTER — Inpatient Hospital Stay (HOSPITAL_BASED_OUTPATIENT_CLINIC_OR_DEPARTMENT_OTHER): Payer: Medicare Other | Admitting: Hematology and Oncology

## 2017-09-09 ENCOUNTER — Other Ambulatory Visit: Payer: Self-pay | Admitting: *Deleted

## 2017-09-09 VITALS — BP 147/74 | HR 65 | Temp 98.3°F | Resp 18 | Wt 216.8 lb

## 2017-09-09 DIAGNOSIS — C9000 Multiple myeloma not having achieved remission: Secondary | ICD-10-CM

## 2017-09-09 DIAGNOSIS — E538 Deficiency of other specified B group vitamins: Secondary | ICD-10-CM

## 2017-09-09 DIAGNOSIS — Z8619 Personal history of other infectious and parasitic diseases: Secondary | ICD-10-CM

## 2017-09-09 DIAGNOSIS — Z8042 Family history of malignant neoplasm of prostate: Secondary | ICD-10-CM | POA: Diagnosis not present

## 2017-09-09 DIAGNOSIS — E119 Type 2 diabetes mellitus without complications: Secondary | ICD-10-CM | POA: Insufficient documentation

## 2017-09-09 DIAGNOSIS — K219 Gastro-esophageal reflux disease without esophagitis: Secondary | ICD-10-CM

## 2017-09-09 DIAGNOSIS — Z7984 Long term (current) use of oral hypoglycemic drugs: Secondary | ICD-10-CM

## 2017-09-09 DIAGNOSIS — F419 Anxiety disorder, unspecified: Secondary | ICD-10-CM

## 2017-09-09 DIAGNOSIS — R0602 Shortness of breath: Secondary | ICD-10-CM | POA: Diagnosis not present

## 2017-09-09 DIAGNOSIS — I252 Old myocardial infarction: Secondary | ICD-10-CM | POA: Insufficient documentation

## 2017-09-09 DIAGNOSIS — Z79899 Other long term (current) drug therapy: Secondary | ICD-10-CM | POA: Diagnosis not present

## 2017-09-09 DIAGNOSIS — E785 Hyperlipidemia, unspecified: Secondary | ICD-10-CM | POA: Insufficient documentation

## 2017-09-09 DIAGNOSIS — I25119 Atherosclerotic heart disease of native coronary artery with unspecified angina pectoris: Secondary | ICD-10-CM | POA: Diagnosis not present

## 2017-09-09 DIAGNOSIS — R05 Cough: Secondary | ICD-10-CM | POA: Insufficient documentation

## 2017-09-09 DIAGNOSIS — G4730 Sleep apnea, unspecified: Secondary | ICD-10-CM | POA: Insufficient documentation

## 2017-09-09 DIAGNOSIS — Z7982 Long term (current) use of aspirin: Secondary | ICD-10-CM | POA: Diagnosis not present

## 2017-09-09 DIAGNOSIS — I509 Heart failure, unspecified: Secondary | ICD-10-CM | POA: Insufficient documentation

## 2017-09-09 DIAGNOSIS — M4800 Spinal stenosis, site unspecified: Secondary | ICD-10-CM

## 2017-09-09 DIAGNOSIS — Z87891 Personal history of nicotine dependence: Secondary | ICD-10-CM

## 2017-09-09 DIAGNOSIS — Z86010 Personal history of colonic polyps: Secondary | ICD-10-CM

## 2017-09-09 DIAGNOSIS — F329 Major depressive disorder, single episode, unspecified: Secondary | ICD-10-CM | POA: Insufficient documentation

## 2017-09-09 DIAGNOSIS — I10 Essential (primary) hypertension: Secondary | ICD-10-CM | POA: Diagnosis not present

## 2017-09-09 LAB — COMPREHENSIVE METABOLIC PANEL
ALT: 26 U/L (ref 17–63)
AST: 41 U/L (ref 15–41)
Albumin: 3.7 g/dL (ref 3.5–5.0)
Alkaline Phosphatase: 62 U/L (ref 38–126)
Anion gap: 7 (ref 5–15)
BUN: 16 mg/dL (ref 6–20)
CO2: 21 mmol/L — ABNORMAL LOW (ref 22–32)
Calcium: 8.8 mg/dL — ABNORMAL LOW (ref 8.9–10.3)
Chloride: 106 mmol/L (ref 101–111)
Creatinine, Ser: 0.87 mg/dL (ref 0.61–1.24)
GFR calc Af Amer: >60 mL/min (ref >60)
GFR calc non Af Amer: >60 mL/min (ref >60)
Glucose, Bld: 257 mg/dL — ABNORMAL HIGH (ref 65–99)
Potassium: 4.2 mmol/L (ref 3.5–5.1)
Sodium: 134 mmol/L — ABNORMAL LOW (ref 135–145)
Total Bilirubin: 0.6 mg/dL (ref 0.3–1.2)
Total Protein: 7.5 g/dL (ref 6.5–8.1)

## 2017-09-09 LAB — CBC WITH DIFFERENTIAL/PLATELET
Basophils Absolute: 0.1 10*3/uL (ref 0–0.1)
Basophils Relative: 3 %
Eosinophils Absolute: 0.2 10*3/uL (ref 0–0.7)
Eosinophils Relative: 7 %
HCT: 37.6 % — ABNORMAL LOW (ref 40.0–52.0)
Hemoglobin: 12.8 g/dL — ABNORMAL LOW (ref 13.0–18.0)
Lymphocytes Relative: 35 %
Lymphs Abs: 1 10*3/uL (ref 1.0–3.6)
MCH: 35.4 pg — ABNORMAL HIGH (ref 26.0–34.0)
MCHC: 34.1 g/dL (ref 32.0–36.0)
MCV: 103.8 fL — ABNORMAL HIGH (ref 80.0–100.0)
Monocytes Absolute: 0.4 10*3/uL (ref 0.2–1.0)
Monocytes Relative: 13 %
Neutro Abs: 1.2 10*3/uL — ABNORMAL LOW (ref 1.4–6.5)
Neutrophils Relative %: 42 %
Platelets: 143 10*3/uL — ABNORMAL LOW (ref 150–440)
RBC: 3.62 MIL/uL — ABNORMAL LOW (ref 4.40–5.90)
RDW: 13.8 % (ref 11.5–14.5)
WBC: 2.8 10*3/uL — ABNORMAL LOW (ref 3.8–10.6)

## 2017-09-09 MED ORDER — CYANOCOBALAMIN 1000 MCG/ML IJ SOLN
1000.0000 ug | Freq: Once | INTRAMUSCULAR | Status: AC
  Administered 2017-09-09: 1000 ug via INTRAMUSCULAR
  Filled 2017-09-09: qty 1

## 2017-09-09 NOTE — Progress Notes (Signed)
Confirmed with Doran Durand, NP that patient does not need Zometa today or need to be scheduled for Zometa at this time.

## 2017-09-09 NOTE — Progress Notes (Signed)
. [facility] day:  09/09/17   Chief Complaint: Luis [name]. is a 71 y.o. male with multiple myeloma who is seen for 1 month assessment on Revlimid.  HPI:  The patient was last seen in the medical oncology clinic on 08/09/2017.  At that time, he was tired.  He was under a lot of stress due to the health of his wife. He had some intermittent diarrhea. Appetite had decreased. He had lost 5 pounds. Exam was stable. WBC was 2900 with an ANC of 1300. Hemoglobin was 12.9, hematocrit 36.7, platelets 177,000.  M spike was 0.  CBC on 08/19/2017 revealed a hematocrit of 37.1, hemoglobin 12.9, MCV 103.2, platelets 161,000, WBC 3400 with an ANC of 1500.  Patient restarted Revlimid.  He was notified about a $7500 grant from the [facility] [place] for his Revlimid.  Out of pocket expense is $0.  During the interim, patient is feeling ok. Patient has a chronic dry cough related to ACE inhibitor therapy. Patient has experienced to bruising or bleeding. He has no B symptoms or interval infections. Patient is eating well. He has gained 6 pounds.  Patient completed most recent cycle of Revlimid on 09/04/2017.                                                                                                                                                                                                                        Past Medical History:  Diagnosis Date  . Angina pectoris ([place])   . Anxiety   . Arthritis   . CHF (congestive heart failure) ([place])   . Coronary artery disease   . Depression   . Diabetes mellitus without complication [facility])    Patient takes Metformin.  . Diverticulosis   . GERD (gastroesophageal reflux disease)   . Hyperlipidemia   . Hypertension   . Multiple myeloma ([place])   . Multiple myeloma ([place]) 03/27/2015  . Myocardial infarction [facility]) April 2001   widowmaker  . Shortness of breath dyspnea   . Sleep apnea    No CPAP @  present  . Spinal stenosis     Past Surgical History:  Procedure Laterality Date  . CARDIAC CATHETERIZATION    . CARPAL TUNNEL RELEASE    . CATARACT EXTRACTION    . COLONOSCOPY WITH PROPOFOL N/A 11/11/2015   Procedure: COLONOSCOPY WITH PROPOFOL;  Surgeon: Lollie Sails, MD;  Location: [facility] ENDOSCOPY;  Service: Endoscopy;  Laterality: N/A;  . CORONARY ARTERY BYPASS GRAFT    . EYE SURGERY Bilateral    Cataract Extraction  . INGUINAL HERNIA REPAIR    . JOINT REPLACEMENT Right 2008   Right Total Hip Replacement  . PILONIDAL CYST EXCISION    . ROTATOR CUFF REPAIR    . TOTAL HIP ARTHROPLASTY Right   . VENTRAL HERNIA REPAIR N/A 08/15/2015   Procedure: VENTRAL HERNIA REPAIR WITH MESH ;  Surgeon: Leonie Green, MD;  Location: [facility] ORS;  Service: General;  Laterality: N/A;    Family History  Problem Relation Age of Onset  . Heart disease Father   . Stroke Mother   . Prostate cancer Maternal Grandfather 66    Social History:  reports that he quit smoking about 17 years ago. His smoking use included Cigarettes. He has a 60.00 pack-year smoking history. He quit smokeless tobacco use about 17 years ago. He reports that he does not drink alcohol or use drugs.  He notes that his wife goes off to work.  He states that his wife would like to retire, but that would affect his medication coverage.  He has projects that he likes to do at home.  He does wood working.  His 42nd high school graduation anniversary was in 06/2016.  He has a new grand-daughter, [name].  His wife is retiring today.  The patient is alone today.  Allergies:  Allergies  Allergen Reactions  . Pravachol [Pravastatin]   . Pravastatin Sodium Other (See Comments)  . Statins Other (See Comments)    Muscle aches  . Zocor [Simvastatin] Other (See Comments)    Muscle aches    Current Medications: Current Outpatient Prescriptions  Medication Sig Dispense Refill  . acetaminophen (TYLENOL) 500 MG tablet Take 500 mg  by mouth every 6 (six) hours as needed.    . ALPRAZolam (XANAX) 0.25 MG tablet Take 0.25 mg by mouth at bedtime as needed. for sleep  3  . amoxicillin (AMOXIL) 875 MG tablet     . aspirin 81 MG tablet Take 81 mg by mouth daily.     [name] Pollen 500 MG CHEW Chew 1 tablet by mouth daily.    . Calcium Carbonate-Vitamin D 600-400 MG-UNIT chew tablet Chew 4 tablets by mouth daily. Takes them throughout the day for a total of 4    . Cyanocobalamin (VITAMIN B-12 IJ) Inject 1 Dose as directed every 30 (thirty) days.     . ergocalciferol (VITAMIN D2) 50000 UNITS capsule Take 50,000 Units by mouth once a week.    Marland Kitchen KLOR-CON M20 20 MEQ tablet TAKE 1 TABLET (20 MEQ TOTAL) BY MOUTH DAILY. 30 tablet 2  . lenalidomide (REVLIMID) 2.5 MG capsule TAKE 1 CAPSULE (2.5MG) BY MOUTH ONCE DAILY FOR 21 DAYS ON THE 7 DAYS OFF 21 capsule 0  . lisinopril (PRINIVIL,ZESTRIL) 20 MG tablet Take 1 tablet by mouth daily.    Marland Kitchen lovastatin (MEVACOR) 40 MG tablet Take 20 mg by mouth at bedtime. 2 tablets a day    . metFORMIN (GLUCOPHAGE) 500 MG tablet 500 mg daily with breakfast.     . omeprazole (PRILOSEC) 20 MG capsule Take 20 mg by mouth daily.    . traMADol (ULTRAM) 50 MG tablet TAKE 1 TABLET BY MOUTH 3 TIMES A DAY AS NEEDED 60 tablet 3  . Zoledronic Acid (ZOMETA) 4 MG/100ML IVPB Inject into the vein.     No current facility-administered medications for this visit.     Review of  Systems:  GENERAL:  Feels "ok" today.  No fevers or sweats.  Weight up 6 pounds. PERFORMANCE STATUS (ECOG): 1 HEENT: No visual changes, runny nose, sore throat, mouth sores or tenderness. Lungs:  No shortness of breath.  Dry cough. No hemoptysis. Cardiac: No chest pain, palpitations, orthopnea, or PND.  Pre-op echo and stress test good. GI: Eating well.  No nausea, vomiting, diarrhea, constipation, melena or hematochezia. GU: No urgency, frequency, dysuria, or hematuria. Musculoskeletal: Spinal stenosis causes legs aches and hip pain.  No  joint pain. No muscle tenderness. Extremities: No pain or swelling. Skin: Skin bleeds easily.  No rashes or skin changes. Neuro: No headache, numbness or weakness, balance or coordination issues. Injection for spinal stenosis. Endocrine: Diabetes.  No thyroid issues, hot flashes or night sweats. Psych: No mood changes, depression or anxiety. He is a "night owl". Pain: No focal pain. Review of systems: All other systems reviewed and found to be negative.  Physical Exam: Blood pressure (!) 147/74, pulse 65, temperature 98.3 F (36.8 C), temperature source Tympanic, resp. rate 18, weight 216 lb 12.8 oz (98.3 kg), SpO2 96 %.  GENERAL: Well developed, well nourished, elderly gentleman sitting comfortably in the exam room in no acute distress.  He has a cane at his side. MENTAL STATUS: Alert and oriented to person, place and time. HEAD: Short white hair. Male pattern baldness. Normocephalic, atraumatic, face symmetric, no Cushingoid features. EYES: Blue eyes. Pupils equal round and reactive to light and accomodation. No conjunctivitis or scleral icterus. ENT: Oropharynx clear without lesion. Tongue normal. Mucous membranes moist.  RESPIRATORY: Clear to auscultation without rales, wheezes or rhonchi. CARDIOVASCULAR: Regular rate and rhythm without murmur, rub or gallop. ABDOMEN: Soft, non-tender, with active bowel sounds, and no hepatosplenomegaly. No guarding or rebound tenderness.  No masses. SKIN: No rashes, ulcers or lesions. EXTREMITIES: No edema, no skin discoloration or tenderness. No palpable cords. LYMPH NODES: No palpable cervical, supraclavicular, axillary or inguinal adenopathy  NEUROLOGICAL: Unremarkable. PSYCH: Appropriate   Appointment on 09/09/2017  Component Date Value Ref Range Status  . WBC 09/09/2017 2.8* 3.8 - 10.6 K/uL Final  . RBC 09/09/2017 3.62* 4.40 - 5.90 MIL/uL Final  . Hemoglobin 09/09/2017 12.8* 13.0 - 18.0 g/dL Final  . HCT 09/09/2017  37.6* 40.0 - 52.0 % Final  . MCV 09/09/2017 103.8* 80.0 - 100.0 fL Final  . MCH 09/09/2017 35.4* 26.0 - 34.0 pg Final  . MCHC 09/09/2017 34.1  32.0 - 36.0 g/dL Final  . RDW 09/09/2017 13.8  11.5 - 14.5 % Final  . Platelets 09/09/2017 143* 150 - 440 K/uL Final  . Neutrophils Relative % 09/09/2017 42  % Final  . Neutro Abs 09/09/2017 1.2* 1.4 - 6.5 K/uL Final  . Lymphocytes Relative 09/09/2017 35  % Final  . Lymphs Abs 09/09/2017 1.0  1.0 - 3.6 K/uL Final  . Monocytes Relative 09/09/2017 13  % Final  . Monocytes Absolute 09/09/2017 0.4  0.2 - 1.0 K/uL Final  . Eosinophils Relative 09/09/2017 7  % Final  . Eosinophils Absolute 09/09/2017 0.2  0 - 0.7 K/uL Final  . Basophils Relative 09/09/2017 3  % Final  . Basophils Absolute 09/09/2017 0.1  0 - 0.1 K/uL Final  . Sodium 09/09/2017 134* 135 - 145 mmol/L Final  . Potassium 09/09/2017 4.2  3.5 - 5.1 mmol/L Final  . Chloride 09/09/2017 106  101 - 111 mmol/L Final  . CO2 09/09/2017 21* 22 - 32 mmol/L Final  . Glucose, Bld 09/09/2017 257* 65 -  99 mg/dL Final  . BUN 09/09/2017 16  6 - 20 mg/dL Final  . Creatinine, Ser 09/09/2017 0.87  0.61 - 1.24 mg/dL Final  . Calcium 09/09/2017 8.8* 8.9 - 10.3 mg/dL Final  . Total Protein 09/09/2017 7.5  6.5 - 8.1 g/dL Final  . Albumin 09/09/2017 3.7  3.5 - 5.0 g/dL Final  . AST 09/09/2017 41  15 - 41 U/L Final  . ALT 09/09/2017 26  17 - 63 U/L Final  . Alkaline Phosphatase 09/09/2017 62  38 - 126 U/L Final  . Total Bilirubin 09/09/2017 0.6  0.3 - 1.2 mg/dL Final  . GFR calc non Af Amer 09/09/2017 >60  >60 mL/min Final  . GFR calc Af Amer 09/09/2017 >60  >60 mL/min Final   Comment: (NOTE) The eGFR has been calculated using the CKD EPI equation. This calculation has not been validated in all clinical situations. eGFR's persistently <60 mL/min signify possible Chronic Kidney Disease.   Georgiann Hahn gap 09/09/2017 7  5 - 15 Final    Assessment:  Luis [name]. is a 71 y.o. male with stage III multiple  myeloma. He presented in 02/2013 with left-sided sharp pain. Evaluation revealed wide-spread lytic lesions including fracture of the left 5th rib laterally. CT scans on 02/22/2013 showed innumerable lytic lesions in thoracic spine, sternum, clavicle, scapula, and ribs.   Bone marrow biopsy on 03/13/2013 revealed 15 to 20% plasma cells. Iron stores were absent. Renal function was normal. SPEP on 03/01/2013 revealed a 1.4 gm/dL IgG monoclonal lambda. IgG was 1987.   He began induction with Revlimid 25 mg a day (3 weeks on and 1 week off) and Decadron (40 mg once a week) on 04/09/2013. SPEP has revealed no monoclonal protein since 07/26/2014 (last check 08/09/2017). IgG was 1362 on 07/26/2014 and 1097 on 12/19/2014.   Lambda free light chains have been monitored: 33.20 (ratio of 1.56) on 09/20/2014, 31.51 (ratio of 1.43) on 12/19/2014, 30.25 (ratio of 1.56) on 05/01/2015, 31.95 (ratio 1.49) on 01/01/2016, 28.02 (ratio 1.29) on 03/25/2016, 31.5 (ratio 1.26) on 05/28/2016, 21.8 (ratio 1.35) on 08/20/2016, and 37.3 (ratio 1.09) on 10/22/2016.  24 hour UPEP on 12/02/2015 revealed no monoclonal protein.  With remission, Revlimid was decreased to 10 mg a day then to 10 mg every other day secondary to issues with renal function and diarrhea.  Current Revlimid is 2.5 mg a day (3 weeks on/1 week off).  Last cycle of Revlimid began on 08/08/2017.  Bone survey on 05/30/2015 revealed the majority of small lytic lesions were stable.  There was possibly new lesion in the distal left clavicle and right mid femur (small) and a possible developing lucency in the proximal left femoral shaft.  The calvarial lesion noted on the prior study was not well seen (? positional).  Bone survey on 11/27/2015 revealed widespread bony lytic lesions consistent with multiple myeloma.  The vast majority of lesions were stable.  A few small lesions were not previously seen.  One lesion was previously obscured by bowel contents.   Two small lesions in the right distal femoral diaphysis appeared new.  Bone survey on 05/25/2016 revealed no evidence of progressive myelomatous involvement of the skeleton.  Bone survey on 05/16/2017 revealed multiple lucent lesions as previously seen. There was no convincing evidence of progression or superimposed abnormality since prior study.  He received Zometa every 3 months (last 02/28/2017). He takes calcium 4 pills/day. He receives B12 monthly (last 08/09/2017). B12 was 370 on 01/17/2015.  Folate was  17.5 on 09/17/2016.  Abdominal and pelvic CT scan on 07/15/2015 revealed  extensive sigmoid diverticulosis. There was questionable wall thickening of the ascending colon versus artifact from incomplete distention.  Colonoscopy on 11/11/2015 revealed diverticulosis in the sigmoid colon and distal descending colon and ascending colon.  There was a 2 mm polyp in the mid sigmoid colon.  Pathology revealed a hyperplastic polyp, negative for dysplasia or malignancy.  Symptomatically, he is doing "ok". Patient under a lot of stress due to the health of his wife. Appetite has improved . He has gained 6 pounds. Exam is stable. WBC is 2800 with an McIntosh of 1200. Hemoglobin 12.8, hematocrit 37.6, platelets 143,000.   Plan: 1.  Labs today:  CBC with diff, CMP. 2.  B12 today and monthly. 3.  Start next of Revlimid on 09/19/2017 if lab counts are adequate. Patient aware that we will need to call him before he starts.  4.  RTC on 09/19/2017 for labs (CBC with diff, CMP) 5.  RTC on 10/21/2017 for MD assessment and labs (CBC with diff, CMP).   Honor Loh, NP  09/09/2017, 11:38 AM   I saw and evaluated the patient, participating in the key portions of the service and reviewing pertinent diagnostic studies and records.  I reviewed the nurse practitioner's note and agree with the findings and the plan.  The assessment and plan were discussed with the patient.  A few questions were asked by the patient and  answered.   Nolon Stalls, MD 09/09/2017,11:38 AM

## 2017-09-10 ENCOUNTER — Encounter: Payer: Self-pay | Admitting: Hematology and Oncology

## 2017-09-19 ENCOUNTER — Telehealth: Payer: Self-pay | Admitting: *Deleted

## 2017-09-19 ENCOUNTER — Inpatient Hospital Stay: Payer: Medicare Other

## 2017-09-19 DIAGNOSIS — C9000 Multiple myeloma not having achieved remission: Secondary | ICD-10-CM | POA: Diagnosis not present

## 2017-09-19 LAB — CBC WITH DIFFERENTIAL/PLATELET
Basophils Absolute: 0.1 10*3/uL (ref 0–0.1)
Basophils Relative: 3 %
Eosinophils Absolute: 0.1 10*3/uL (ref 0–0.7)
Eosinophils Relative: 3 %
HCT: 38.4 % — ABNORMAL LOW (ref 40.0–52.0)
Hemoglobin: 13.3 g/dL (ref 13.0–18.0)
Lymphocytes Relative: 31 %
Lymphs Abs: 1 10*3/uL (ref 1.0–3.6)
MCH: 35.8 pg — ABNORMAL HIGH (ref 26.0–34.0)
MCHC: 34.6 g/dL (ref 32.0–36.0)
MCV: 103.6 fL — ABNORMAL HIGH (ref 80.0–100.0)
Monocytes Absolute: 0.4 10*3/uL (ref 0.2–1.0)
Monocytes Relative: 11 %
Neutro Abs: 1.7 10*3/uL (ref 1.4–6.5)
Neutrophils Relative %: 52 %
Platelets: 195 10*3/uL (ref 150–440)
RBC: 3.71 MIL/uL — ABNORMAL LOW (ref 4.40–5.90)
RDW: 13.8 % (ref 11.5–14.5)
WBC: 3.3 10*3/uL — ABNORMAL LOW (ref 3.8–10.6)

## 2017-09-19 LAB — COMPREHENSIVE METABOLIC PANEL
ALT: 35 U/L (ref 17–63)
AST: 44 U/L — ABNORMAL HIGH (ref 15–41)
Albumin: 3.6 g/dL (ref 3.5–5.0)
Alkaline Phosphatase: 73 U/L (ref 38–126)
Anion gap: 10 (ref 5–15)
BUN: 20 mg/dL (ref 6–20)
CO2: 22 mmol/L (ref 22–32)
Calcium: 9.5 mg/dL (ref 8.9–10.3)
Chloride: 100 mmol/L — ABNORMAL LOW (ref 101–111)
Creatinine, Ser: 0.89 mg/dL (ref 0.61–1.24)
GFR calc Af Amer: >60 mL/min (ref >60)
GFR calc non Af Amer: >60 mL/min (ref >60)
Glucose, Bld: 271 mg/dL — ABNORMAL HIGH (ref 65–99)
Potassium: 4.6 mmol/L (ref 3.5–5.1)
Sodium: 132 mmol/L — ABNORMAL LOW (ref 135–145)
Total Bilirubin: 0.6 mg/dL (ref 0.3–1.2)
Total Protein: 7.9 g/dL (ref 6.5–8.1)

## 2017-09-19 NOTE — Telephone Encounter (Signed)
-----   Message from Lequita Asal, MD sent at 09/19/2017 12:09 PM EST ----- Regarding: Counts look good   ----- Message ----- From: [place]: 09/19/2017  11:52 AM To: Lequita Asal, MD

## 2017-09-19 NOTE — Telephone Encounter (Signed)
Called patient to inform him that his lab results look good.  Patient appreciative of call.

## 2017-09-23 ENCOUNTER — Other Ambulatory Visit: Payer: Self-pay | Admitting: Hematology and Oncology

## 2017-09-23 ENCOUNTER — Telehealth: Payer: Self-pay | Admitting: *Deleted

## 2017-09-23 DIAGNOSIS — C9000 Multiple myeloma not having achieved remission: Secondary | ICD-10-CM

## 2017-09-23 NOTE — Telephone Encounter (Signed)
Called [facility] with [place] # [id number].

## 2017-10-14 ENCOUNTER — Inpatient Hospital Stay: Payer: Medicare Other | Attending: Hematology and Oncology

## 2017-10-14 ENCOUNTER — Telehealth: Payer: Self-pay | Admitting: Hematology and Oncology

## 2017-10-14 DIAGNOSIS — Z7982 Long term (current) use of aspirin: Secondary | ICD-10-CM | POA: Insufficient documentation

## 2017-10-14 DIAGNOSIS — E119 Type 2 diabetes mellitus without complications: Secondary | ICD-10-CM | POA: Diagnosis not present

## 2017-10-14 DIAGNOSIS — E785 Hyperlipidemia, unspecified: Secondary | ICD-10-CM | POA: Diagnosis not present

## 2017-10-14 DIAGNOSIS — E538 Deficiency of other specified B group vitamins: Secondary | ICD-10-CM | POA: Diagnosis not present

## 2017-10-14 DIAGNOSIS — Z8719 Personal history of other diseases of the digestive system: Secondary | ICD-10-CM | POA: Diagnosis not present

## 2017-10-14 DIAGNOSIS — I25119 Atherosclerotic heart disease of native coronary artery with unspecified angina pectoris: Secondary | ICD-10-CM | POA: Diagnosis not present

## 2017-10-14 DIAGNOSIS — F419 Anxiety disorder, unspecified: Secondary | ICD-10-CM | POA: Diagnosis not present

## 2017-10-14 DIAGNOSIS — I509 Heart failure, unspecified: Secondary | ICD-10-CM | POA: Insufficient documentation

## 2017-10-14 DIAGNOSIS — Z79899 Other long term (current) drug therapy: Secondary | ICD-10-CM | POA: Diagnosis not present

## 2017-10-14 DIAGNOSIS — Z7984 Long term (current) use of oral hypoglycemic drugs: Secondary | ICD-10-CM | POA: Insufficient documentation

## 2017-10-14 DIAGNOSIS — C9000 Multiple myeloma not having achieved remission: Secondary | ICD-10-CM | POA: Insufficient documentation

## 2017-10-14 DIAGNOSIS — G4730 Sleep apnea, unspecified: Secondary | ICD-10-CM | POA: Diagnosis not present

## 2017-10-14 DIAGNOSIS — Z87891 Personal history of nicotine dependence: Secondary | ICD-10-CM | POA: Insufficient documentation

## 2017-10-14 DIAGNOSIS — I10 Essential (primary) hypertension: Secondary | ICD-10-CM | POA: Diagnosis not present

## 2017-10-14 DIAGNOSIS — R0602 Shortness of breath: Secondary | ICD-10-CM | POA: Insufficient documentation

## 2017-10-14 DIAGNOSIS — I252 Old myocardial infarction: Secondary | ICD-10-CM | POA: Insufficient documentation

## 2017-10-14 DIAGNOSIS — F329 Major depressive disorder, single episode, unspecified: Secondary | ICD-10-CM | POA: Diagnosis not present

## 2017-10-14 DIAGNOSIS — K219 Gastro-esophageal reflux disease without esophagitis: Secondary | ICD-10-CM | POA: Insufficient documentation

## 2017-10-14 DIAGNOSIS — M4800 Spinal stenosis, site unspecified: Secondary | ICD-10-CM | POA: Diagnosis not present

## 2017-10-14 DIAGNOSIS — Z86010 Personal history of colonic polyps: Secondary | ICD-10-CM | POA: Insufficient documentation

## 2017-10-14 MED ORDER — CYANOCOBALAMIN 1000 MCG/ML IJ SOLN
1000.0000 ug | Freq: Once | INTRAMUSCULAR | Status: AC
  Administered 2017-10-14: 1000 ug via INTRAMUSCULAR
  Filled 2017-10-14: qty 1

## 2017-10-14 NOTE — Telephone Encounter (Signed)
Oral Oncology Patient Advocate Encounter  Meet Mr. Luis [name] in lobby . He said he had not gotten his shipment this month of Revlimid would I please call about it. I called [facility] and Horris Latino called patient while I was on hold. He told her he would call her back to set up delivery. He told her he was not going to start until he was seen by doctor on the 14th. I let Gaspar Bidding NP know.    [place] Patient Advocate [phone] 10/14/2017 3:16 PM

## 2017-10-18 ENCOUNTER — Other Ambulatory Visit: Payer: Self-pay | Admitting: Hematology and Oncology

## 2017-10-18 DIAGNOSIS — C9000 Multiple myeloma not having achieved remission: Secondary | ICD-10-CM

## 2017-10-21 ENCOUNTER — Inpatient Hospital Stay (HOSPITAL_BASED_OUTPATIENT_CLINIC_OR_DEPARTMENT_OTHER): Payer: Medicare Other | Admitting: Hematology and Oncology

## 2017-10-21 ENCOUNTER — Other Ambulatory Visit: Payer: Self-pay | Admitting: Hematology and Oncology

## 2017-10-21 ENCOUNTER — Inpatient Hospital Stay: Payer: Medicare Other

## 2017-10-21 VITALS — BP 148/85 | HR 61 | Temp 97.3°F | Resp 20 | Wt 220.6 lb

## 2017-10-21 DIAGNOSIS — F419 Anxiety disorder, unspecified: Secondary | ICD-10-CM

## 2017-10-21 DIAGNOSIS — Z87891 Personal history of nicotine dependence: Secondary | ICD-10-CM

## 2017-10-21 DIAGNOSIS — I10 Essential (primary) hypertension: Secondary | ICD-10-CM | POA: Diagnosis not present

## 2017-10-21 DIAGNOSIS — C9000 Multiple myeloma not having achieved remission: Secondary | ICD-10-CM

## 2017-10-21 DIAGNOSIS — K219 Gastro-esophageal reflux disease without esophagitis: Secondary | ICD-10-CM

## 2017-10-21 DIAGNOSIS — M4800 Spinal stenosis, site unspecified: Secondary | ICD-10-CM | POA: Diagnosis not present

## 2017-10-21 DIAGNOSIS — Z8719 Personal history of other diseases of the digestive system: Secondary | ICD-10-CM

## 2017-10-21 DIAGNOSIS — I25119 Atherosclerotic heart disease of native coronary artery with unspecified angina pectoris: Secondary | ICD-10-CM | POA: Diagnosis not present

## 2017-10-21 DIAGNOSIS — E785 Hyperlipidemia, unspecified: Secondary | ICD-10-CM | POA: Diagnosis not present

## 2017-10-21 DIAGNOSIS — E538 Deficiency of other specified B group vitamins: Secondary | ICD-10-CM | POA: Diagnosis not present

## 2017-10-21 DIAGNOSIS — I509 Heart failure, unspecified: Secondary | ICD-10-CM

## 2017-10-21 DIAGNOSIS — F329 Major depressive disorder, single episode, unspecified: Secondary | ICD-10-CM | POA: Diagnosis not present

## 2017-10-21 DIAGNOSIS — G4730 Sleep apnea, unspecified: Secondary | ICD-10-CM

## 2017-10-21 DIAGNOSIS — R0602 Shortness of breath: Secondary | ICD-10-CM

## 2017-10-21 DIAGNOSIS — Z86010 Personal history of colonic polyps: Secondary | ICD-10-CM

## 2017-10-21 DIAGNOSIS — Z79899 Other long term (current) drug therapy: Secondary | ICD-10-CM

## 2017-10-21 DIAGNOSIS — Z7982 Long term (current) use of aspirin: Secondary | ICD-10-CM

## 2017-10-21 DIAGNOSIS — E119 Type 2 diabetes mellitus without complications: Secondary | ICD-10-CM | POA: Diagnosis not present

## 2017-10-21 DIAGNOSIS — Z7984 Long term (current) use of oral hypoglycemic drugs: Secondary | ICD-10-CM

## 2017-10-21 DIAGNOSIS — I252 Old myocardial infarction: Secondary | ICD-10-CM

## 2017-10-21 LAB — CBC WITH DIFFERENTIAL/PLATELET
Basophils Absolute: 0.1 10*3/uL (ref 0–0.1)
Basophils Relative: 3 %
Eosinophils Absolute: 0.1 10*3/uL (ref 0–0.7)
Eosinophils Relative: 4 %
HCT: 37.7 % — ABNORMAL LOW (ref 40.0–52.0)
Hemoglobin: 13 g/dL (ref 13.0–18.0)
Lymphocytes Relative: 30 %
Lymphs Abs: 1.1 10*3/uL (ref 1.0–3.6)
MCH: 35.4 pg — ABNORMAL HIGH (ref 26.0–34.0)
MCHC: 34.3 g/dL (ref 32.0–36.0)
MCV: 103.1 fL — ABNORMAL HIGH (ref 80.0–100.0)
Monocytes Absolute: 0.5 10*3/uL (ref 0.2–1.0)
Monocytes Relative: 15 %
Neutro Abs: 1.7 10*3/uL (ref 1.4–6.5)
Neutrophils Relative %: 48 %
Platelets: 157 10*3/uL (ref 150–440)
RBC: 3.66 MIL/uL — ABNORMAL LOW (ref 4.40–5.90)
RDW: 14 % (ref 11.5–14.5)
WBC: 3.5 10*3/uL — ABNORMAL LOW (ref 3.8–10.6)

## 2017-10-21 LAB — COMPREHENSIVE METABOLIC PANEL
ALT: 35 U/L (ref 17–63)
AST: 50 U/L — ABNORMAL HIGH (ref 15–41)
Albumin: 3.6 g/dL (ref 3.5–5.0)
Alkaline Phosphatase: 81 U/L (ref 38–126)
Anion gap: 7 (ref 5–15)
BUN: 16 mg/dL (ref 6–20)
CO2: 23 mmol/L (ref 22–32)
Calcium: 9 mg/dL (ref 8.9–10.3)
Chloride: 103 mmol/L (ref 101–111)
Creatinine, Ser: 0.84 mg/dL (ref 0.61–1.24)
GFR calc Af Amer: >60 mL/min (ref >60)
GFR calc non Af Amer: >60 mL/min (ref >60)
Glucose, Bld: 344 mg/dL — ABNORMAL HIGH (ref 65–99)
Potassium: 4.3 mmol/L (ref 3.5–5.1)
Sodium: 133 mmol/L — ABNORMAL LOW (ref 135–145)
Total Bilirubin: 0.6 mg/dL (ref 0.3–1.2)
Total Protein: 7.9 g/dL (ref 6.5–8.1)

## 2017-10-21 NOTE — Progress Notes (Signed)
Patient states he is sore in his sides from shoveling snow. Otherwise, offers no complaints.

## 2017-10-21 NOTE — Progress Notes (Signed)
. [facility] day:  10/21/17   Chief Complaint: Luis [name]. is a 71 y.o. male with multiple myeloma who is seen for 1 month assessment on Revlimid.  HPI:  The patient was last seen in the medical oncology clinic on 09/09/2017.  At that time, he was doing "ok".  He was under a lot of stress due to the health of his wife. Appetite had improved . He had gained 6 pounds. Exam was stable. WBC was 2800 with an [place] of 1200. Hemoglobin was 12.8, hematocrit 37.6, platelets 143,000.   He received B12.  CBC on 09/19/2017 revealed a hematocrit 38.4, hemoglobin 13.3, MCV 103.6, platelets 195,000, white count 3300 with an ANC of 1700.  He began Revlimid on 09/19/2017.  During the interim, he has felt "fairly good".  His spinal stenosis is acting up.  He completed his last course of Revlimid on 10/11/2017.                                                                                                                                                                                                                      Past Medical History:  Diagnosis Date  . Angina pectoris ([place])   . Anxiety   . Arthritis   . CHF (congestive heart failure) ([place])   . Coronary artery disease   . Depression   . Diabetes mellitus without complication [facility])    Patient takes Metformin.  . Diverticulosis   . GERD (gastroesophageal reflux disease)   . Hyperlipidemia   . Hypertension   . Multiple myeloma ([place])   . Multiple myeloma ([place]) 03/27/2015  . Myocardial infarction [facility]) April 2001   widowmaker  . Shortness of breath dyspnea   . Sleep apnea    No CPAP @ present  . Spinal stenosis     Past Surgical History:  Procedure Laterality Date  . CARDIAC CATHETERIZATION    . CARPAL TUNNEL RELEASE    . CATARACT EXTRACTION    . COLONOSCOPY WITH PROPOFOL N/A 11/11/2015   Procedure: COLONOSCOPY WITH PROPOFOL;  Surgeon: Lollie Sails, MD;  Location: [facility] ENDOSCOPY;  Service:  Endoscopy;  Laterality: N/A;  . CORONARY ARTERY BYPASS GRAFT    . EYE SURGERY Bilateral    Cataract Extraction  . INGUINAL HERNIA REPAIR    . JOINT REPLACEMENT Right 2008   Right Total Hip Replacement  . PILONIDAL CYST EXCISION    . ROTATOR CUFF REPAIR    .  TOTAL HIP ARTHROPLASTY Right   . VENTRAL HERNIA REPAIR N/A 08/15/2015   Procedure: VENTRAL HERNIA REPAIR WITH MESH ;  Surgeon: Leonie Green, MD;  Location: [facility] ORS;  Service: General;  Laterality: N/A;    Family History  Problem Relation Age of Onset  . Heart disease Father   . Stroke Mother   . Prostate cancer Maternal Grandfather 79    Social History:  reports that he quit smoking about 17 years ago. His smoking use included cigarettes. He has a 60.00 pack-year smoking history. He quit smokeless tobacco use about 17 years ago. He reports that he does not drink alcohol or use drugs.  He notes that his wife goes off to work.  He states that his wife would like to retire, but that would affect his medication coverage.  He has projects that he likes to do at home.  He does wood working.  His 42nd high school graduation anniversary was in 06/2016.  He has a new grand-daughter, [name].  His wife is retiring today.  The patient is alone today.  Allergies:  Allergies  Allergen Reactions  . Pravachol [Pravastatin]   . Pravastatin Sodium Other (See Comments)  . Statins Other (See Comments)    Muscle aches  . Zocor [Simvastatin] Other (See Comments)    Muscle aches    Current Medications: Current Outpatient Medications  Medication Sig Dispense Refill  . acetaminophen (TYLENOL) 500 MG tablet Take 500 mg by mouth every 6 (six) hours as needed.    . ALPRAZolam (XANAX) 0.25 MG tablet Take 0.25 mg by mouth at bedtime as needed. for sleep  3  . aspirin 81 MG tablet Take 81 mg by mouth daily.     [name] Pollen 500 MG CHEW Chew 1 tablet by mouth daily.    . Calcium Carbonate-Vitamin D 600-400 MG-UNIT chew tablet Chew 4 tablets  by mouth daily. Takes them throughout the day for a total of 4    . Cyanocobalamin (VITAMIN B-12 IJ) Inject 1 Dose as directed every 30 (thirty) days.     . ergocalciferol (VITAMIN D2) 50000 UNITS capsule Take 50,000 Units by mouth once a week.    Marland Kitchen lisinopril (PRINIVIL,ZESTRIL) 20 MG tablet Take 1 tablet by mouth daily.    Marland Kitchen lovastatin (MEVACOR) 40 MG tablet Take 20 mg by mouth at bedtime. 2 tablets a day    . metFORMIN (GLUCOPHAGE) 500 MG tablet 500 mg daily with breakfast.     . omeprazole (PRILOSEC) 20 MG capsule Take 20 mg by mouth daily.    . traMADol (ULTRAM) 50 MG tablet TAKE 1 TABLET BY MOUTH 3 TIMES A DAY AS NEEDED 60 tablet 3  . Zoledronic Acid (ZOMETA) 4 MG/100ML IVPB Inject into the vein.    Marland Kitchen amoxicillin (AMOXIL) 875 MG tablet     . KLOR-CON M20 20 MEQ tablet TAKE 1 TABLET (20 MEQ TOTAL) BY MOUTH DAILY. (Patient not taking: Reported on 10/21/2017) 30 tablet 2  . REVLIMID 2.5 MG capsule TAKE 1 CAPSULE BY MOUTH ONCE DAILY FOR 21 DAYS ON THEN 7 DAYS OFF (Patient not taking: Reported on 10/21/2017) 21 capsule 0   No current facility-administered medications for this visit.     Review of Systems:  GENERAL:  Feels "fairly good" today.  No fevers or sweats.  Weight up 4 pounds. PERFORMANCE STATUS (ECOG): 1 HEENT: No visual changes, runny nose, sore throat, mouth sores or tenderness. Lungs:  No shortness of breath.  Dry cough. No hemoptysis.  Cardiac: No chest pain, palpitations, orthopnea, or PND.  Pre-op echo and stress test good. GI: Eating well.  No nausea, vomiting, diarrhea, constipation, melena or hematochezia. GU: No urgency, frequency, dysuria, or hematuria. Musculoskeletal: Spinal stenosis causes legs aches and hip pain.  No joint pain. No muscle tenderness. Extremities: No pain or swelling. Skin: Skin bleeds easily.  No rashes or skin changes. Neuro: Spinal stenosis, acting up.  No headache, numbness or weakness, balance or coordination issues. Injection for  spinal stenosis. Endocrine: Diabetes.  No thyroid issues, hot flashes or night sweats. Psych: No mood changes, depression or anxiety. He is a "night owl". Pain: No focal pain. Review of systems: All other systems reviewed and found to be negative.  Physical Exam: Blood pressure (!) 148/85, pulse 61, temperature (!) 97.3 F (36.3 C), temperature source Tympanic, resp. rate 20, weight 220 lb 9 oz (100 kg).  GENERAL: Well developed, well nourished, elderly gentleman sitting comfortably in the exam room in no acute distress.  He has a cane at his side. MENTAL STATUS: Alert and oriented to person, place and time. HEAD: Short white hair. Male pattern baldness. Normocephalic, atraumatic, face symmetric, no Cushingoid features. EYES: Blue eyes. Pupils equal round and reactive to light and accomodation. No conjunctivitis or scleral icterus. ENT: Oropharynx clear without lesion. Tongue normal. Mucous membranes moist.  RESPIRATORY: Clear to auscultation without rales, wheezes or rhonchi. CARDIOVASCULAR: Regular rate and rhythm without murmur, rub or gallop. ABDOMEN: Soft, non-tender, with active bowel sounds, and no hepatosplenomegaly. No guarding or rebound tenderness.  No masses. SKIN: No rashes, ulcers or lesions. EXTREMITIES: No edema, no skin discoloration or tenderness. No palpable cords. LYMPH NODES: No palpable cervical, supraclavicular, axillary or inguinal adenopathy  NEUROLOGICAL: Unremarkable. PSYCH: Appropriate   Appointment on 10/21/2017  Component Date Value Ref Range Status  . WBC 10/21/2017 3.5* 3.8 - 10.6 K/uL Final  . RBC 10/21/2017 3.66* 4.40 - 5.90 MIL/uL Final  . Hemoglobin 10/21/2017 13.0  13.0 - 18.0 g/dL Final  . HCT 10/21/2017 37.7* 40.0 - 52.0 % Final  . MCV 10/21/2017 103.1* 80.0 - 100.0 fL Final  . MCH 10/21/2017 35.4* 26.0 - 34.0 pg Final  . MCHC 10/21/2017 34.3  32.0 - 36.0 g/dL Final  . RDW 10/21/2017 14.0  11.5 - 14.5 % Final  . Platelets  10/21/2017 157  150 - 440 K/uL Final  . Neutrophils Relative % 10/21/2017 48  % Final  . Neutro Abs 10/21/2017 1.7  1.4 - 6.5 K/uL Final  . Lymphocytes Relative 10/21/2017 30  % Final  . Lymphs Abs 10/21/2017 1.1  1.0 - 3.6 K/uL Final  . Monocytes Relative 10/21/2017 15  % Final  . Monocytes Absolute 10/21/2017 0.5  0.2 - 1.0 K/uL Final  . Eosinophils Relative 10/21/2017 4  % Final  . Eosinophils Absolute 10/21/2017 0.1  0 - 0.7 K/uL Final  . Basophils Relative 10/21/2017 3  % Final  . Basophils Absolute 10/21/2017 0.1  0 - 0.1 K/uL Final  . Sodium 10/21/2017 133* 135 - 145 mmol/L Final  . Potassium 10/21/2017 4.3  3.5 - 5.1 mmol/L Final  . Chloride 10/21/2017 103  101 - 111 mmol/L Final  . CO2 10/21/2017 23  22 - 32 mmol/L Final  . Glucose, Bld 10/21/2017 344* 65 - 99 mg/dL Final  . BUN 10/21/2017 16  6 - 20 mg/dL Final  . Creatinine, Ser 10/21/2017 0.84  0.61 - 1.24 mg/dL Final  . Calcium 10/21/2017 9.0  8.9 - 10.3 mg/dL Final  .  Total Protein 10/21/2017 7.9  6.5 - 8.1 g/dL Final  . Albumin 10/21/2017 3.6  3.5 - 5.0 g/dL Final  . AST 10/21/2017 50* 15 - 41 U/L Final  . ALT 10/21/2017 35  17 - 63 U/L Final  . Alkaline Phosphatase 10/21/2017 81  38 - 126 U/L Final  . Total Bilirubin 10/21/2017 0.6  0.3 - 1.2 mg/dL Final  . GFR calc non Af Amer 10/21/2017 >60  >60 mL/min Final  . GFR calc Af Amer 10/21/2017 >60  >60 mL/min Final   Comment: (NOTE) The eGFR has been calculated using the CKD EPI equation. This calculation has not been validated in all clinical situations. eGFR's persistently <60 mL/min signify possible Chronic Kidney Disease.   Georgiann Hahn gap 10/21/2017 7  5 - 15 Final    Assessment:  Luis [name]. is a 71 y.o. male with stage III multiple myeloma. He presented in 02/2013 with left-sided sharp pain. Evaluation revealed wide-spread lytic lesions including fracture of the left 5th rib laterally. CT scans on 02/22/2013 showed innumerable lytic lesions in thoracic  spine, sternum, clavicle, scapula, and ribs.   Bone marrow biopsy on 03/13/2013 revealed 15 to 20% plasma cells. Iron stores were absent. Renal function was normal. SPEP on 03/01/2013 revealed a 1.4 gm/dL IgG monoclonal lambda. IgG was 1987.   He began induction with Revlimid 25 mg a day (3 weeks on and 1 week off) and Decadron (40 mg once a week) on 04/09/2013. SPEP has revealed no monoclonal protein since 07/26/2014 (last check 08/09/2017). IgG was 1362 on 07/26/2014 and 1097 on 12/19/2014.   Lambda free light chains have been monitored: 33.20 (ratio of 1.56) on 09/20/2014, 31.51 (ratio of 1.43) on 12/19/2014, 30.25 (ratio of 1.56) on 05/01/2015, 31.95 (ratio 1.49) on 01/01/2016, 28.02 (ratio 1.29) on 03/25/2016, 31.5 (ratio 1.26) on 05/28/2016, 21.8 (ratio 1.35) on 08/20/2016, and 37.3 (ratio 1.09) on 10/22/2016.  24 hour UPEP on 12/02/2015 revealed no monoclonal protein.  With remission, Revlimid was decreased to 10 mg a day then to 10 mg every other day secondary to issues with renal function and diarrhea.  Current Revlimid is 2.5 mg a day (3 weeks on/1 week off).  Last cycle of Revlimid began on 09/19/2017.  Bone survey on 05/30/2015 revealed the majority of small lytic lesions were stable.  There was possibly new lesion in the distal left clavicle and right mid femur (small) and a possible developing lucency in the proximal left femoral shaft.  The calvarial lesion noted on the prior study was not well seen (? positional).  Bone survey on 11/27/2015 revealed widespread bony lytic lesions consistent with multiple myeloma.  The vast majority of lesions were stable.  A few small lesions were not previously seen.  One lesion was previously obscured by bowel contents.  Two small lesions in the right distal femoral diaphysis appeared new.  Bone survey on 05/25/2016 revealed no evidence of progressive myelomatous involvement of the skeleton.  Bone survey on 05/16/2017 revealed multiple lucent  lesions as previously seen. There was no convincing evidence of progression or superimposed abnormality since prior study.  He received Zometa every 3 months (last 02/28/2017). He takes calcium 4 pills/day. He receives B12 monthly (last 10/14/2017). B12 was 370 on 01/17/2015.  Folate was 17.5 on 09/17/2016.  Abdominal and pelvic CT scan on 07/15/2015 revealed  extensive sigmoid diverticulosis. There was questionable wall thickening of the ascending colon versus artifact from incomplete distention.  Colonoscopy on 11/11/2015 revealed diverticulosis in the sigmoid  colon and distal descending colon and ascending colon.  There was a 2 mm polyp in the mid sigmoid colon.  Pathology revealed a hyperplastic polyp, negative for dysplasia or malignancy.  Symptomatically, he is doing "fairly good".  His spinal stenosis is acting up.  He has gained 4 pounds. Exam is stable. WBC is 3300 with an ANC of 1700. Hemoglobin 13.3, hematocrit 38.4, platelets 195,000.   Plan: 1.  Labs today:  CBC with diff, CMP, SPEP, FLCA. 2.  Continue B12 monthly (due 11/11/2016). 3.  Start next cycle of Revlimid today 4.  RTC in 1 month for MD assessment and labs (CBC with diff, CMP, Mg).   Lequita Asal, MD  10/21/2017, 12:35 PM

## 2017-10-23 ENCOUNTER — Encounter: Payer: Self-pay | Admitting: Hematology and Oncology

## 2017-10-24 LAB — KAPPA/LAMBDA LIGHT CHAINS
Kappa free light chain: 46.3 mg/L — ABNORMAL HIGH (ref 3.3–19.4)
Kappa, lambda light chain ratio: 1.49 (ref 0.26–1.65)
Lambda free light chains: 31 mg/L — ABNORMAL HIGH (ref 5.7–26.3)

## 2017-10-24 LAB — PROTEIN ELECTROPHORESIS, SERUM
A/G Ratio: 0.8 (ref 0.7–1.7)
Albumin ELP: 3.4 g/dL (ref 2.9–4.4)
Alpha-1-Globulin: 0.3 g/dL (ref 0.0–0.4)
Alpha-2-Globulin: 0.8 g/dL (ref 0.4–1.0)
Beta Globulin: 1.6 g/dL — ABNORMAL HIGH (ref 0.7–1.3)
Gamma Globulin: 1.4 g/dL (ref 0.4–1.8)
Globulin, Total: 4.2 g/dL — ABNORMAL HIGH (ref 2.2–3.9)
Total Protein ELP: 7.6 g/dL (ref 6.0–8.5)

## 2017-11-05 ENCOUNTER — Other Ambulatory Visit: Payer: Self-pay | Admitting: Hematology and Oncology

## 2017-11-07 ENCOUNTER — Telehealth: Payer: Self-pay | Admitting: *Deleted

## 2017-11-07 NOTE — Telephone Encounter (Signed)
MD received request for Tramadol refill.  Called patient to inquire if he is requesting refill or if the pharmacy is notifying us.  Patient states it was the pharmacy but would like a refill as he does take PRN and is almost out.  MD notified.

## 2017-11-11 ENCOUNTER — Inpatient Hospital Stay: Payer: Medicare Other | Attending: Hematology and Oncology

## 2017-11-11 ENCOUNTER — Other Ambulatory Visit: Payer: Self-pay | Admitting: Urgent Care

## 2017-11-11 DIAGNOSIS — C9000 Multiple myeloma not having achieved remission: Secondary | ICD-10-CM | POA: Diagnosis present

## 2017-11-11 DIAGNOSIS — F329 Major depressive disorder, single episode, unspecified: Secondary | ICD-10-CM | POA: Diagnosis not present

## 2017-11-11 DIAGNOSIS — I509 Heart failure, unspecified: Secondary | ICD-10-CM | POA: Diagnosis not present

## 2017-11-11 DIAGNOSIS — M129 Arthropathy, unspecified: Secondary | ICD-10-CM | POA: Insufficient documentation

## 2017-11-11 DIAGNOSIS — Z79899 Other long term (current) drug therapy: Secondary | ICD-10-CM | POA: Insufficient documentation

## 2017-11-11 DIAGNOSIS — Z7984 Long term (current) use of oral hypoglycemic drugs: Secondary | ICD-10-CM | POA: Insufficient documentation

## 2017-11-11 DIAGNOSIS — M4800 Spinal stenosis, site unspecified: Secondary | ICD-10-CM | POA: Diagnosis not present

## 2017-11-11 DIAGNOSIS — E785 Hyperlipidemia, unspecified: Secondary | ICD-10-CM | POA: Insufficient documentation

## 2017-11-11 DIAGNOSIS — I252 Old myocardial infarction: Secondary | ICD-10-CM | POA: Insufficient documentation

## 2017-11-11 DIAGNOSIS — I10 Essential (primary) hypertension: Secondary | ICD-10-CM | POA: Insufficient documentation

## 2017-11-11 DIAGNOSIS — E1165 Type 2 diabetes mellitus with hyperglycemia: Secondary | ICD-10-CM | POA: Insufficient documentation

## 2017-11-11 DIAGNOSIS — Z87891 Personal history of nicotine dependence: Secondary | ICD-10-CM | POA: Diagnosis not present

## 2017-11-11 DIAGNOSIS — I209 Angina pectoris, unspecified: Secondary | ICD-10-CM | POA: Diagnosis not present

## 2017-11-11 DIAGNOSIS — Z9181 History of falling: Secondary | ICD-10-CM | POA: Diagnosis not present

## 2017-11-11 DIAGNOSIS — G4730 Sleep apnea, unspecified: Secondary | ICD-10-CM | POA: Diagnosis not present

## 2017-11-11 DIAGNOSIS — Z8042 Family history of malignant neoplasm of prostate: Secondary | ICD-10-CM | POA: Insufficient documentation

## 2017-11-11 DIAGNOSIS — K219 Gastro-esophageal reflux disease without esophagitis: Secondary | ICD-10-CM | POA: Diagnosis not present

## 2017-11-11 DIAGNOSIS — K5730 Diverticulosis of large intestine without perforation or abscess without bleeding: Secondary | ICD-10-CM | POA: Insufficient documentation

## 2017-11-11 DIAGNOSIS — F419 Anxiety disorder, unspecified: Secondary | ICD-10-CM | POA: Insufficient documentation

## 2017-11-11 DIAGNOSIS — M542 Cervicalgia: Secondary | ICD-10-CM | POA: Insufficient documentation

## 2017-11-11 DIAGNOSIS — E538 Deficiency of other specified B group vitamins: Secondary | ICD-10-CM

## 2017-11-11 DIAGNOSIS — Z7982 Long term (current) use of aspirin: Secondary | ICD-10-CM | POA: Insufficient documentation

## 2017-11-11 DIAGNOSIS — E119 Type 2 diabetes mellitus without complications: Secondary | ICD-10-CM | POA: Diagnosis not present

## 2017-11-11 DIAGNOSIS — I2510 Atherosclerotic heart disease of native coronary artery without angina pectoris: Secondary | ICD-10-CM | POA: Diagnosis not present

## 2017-11-11 DIAGNOSIS — R0602 Shortness of breath: Secondary | ICD-10-CM | POA: Diagnosis not present

## 2017-11-11 DIAGNOSIS — Z86010 Personal history of colonic polyps: Secondary | ICD-10-CM | POA: Insufficient documentation

## 2017-11-11 DIAGNOSIS — Z8719 Personal history of other diseases of the digestive system: Secondary | ICD-10-CM | POA: Insufficient documentation

## 2017-11-11 MED ORDER — CYANOCOBALAMIN 1000 MCG/ML IJ SOLN
1000.0000 ug | Freq: Once | INTRAMUSCULAR | Status: AC
  Administered 2017-11-11: 1000 ug via INTRAMUSCULAR

## 2017-11-14 ENCOUNTER — Other Ambulatory Visit: Payer: Self-pay | Admitting: Urgent Care

## 2017-11-14 DIAGNOSIS — C9000 Multiple myeloma not having achieved remission: Secondary | ICD-10-CM

## 2017-11-14 MED ORDER — LENALIDOMIDE 2.5 MG PO CAPS: ORAL_CAPSULE | ORAL | 0 refills | Status: DC

## 2017-11-18 ENCOUNTER — Inpatient Hospital Stay: Payer: Medicare Other

## 2017-11-18 ENCOUNTER — Inpatient Hospital Stay (HOSPITAL_BASED_OUTPATIENT_CLINIC_OR_DEPARTMENT_OTHER): Payer: Medicare Other | Admitting: Hematology and Oncology

## 2017-11-18 ENCOUNTER — Other Ambulatory Visit: Payer: Self-pay | Admitting: Hematology and Oncology

## 2017-11-18 VITALS — BP 148/76 | HR 71 | Temp 97.4°F | Wt 220.4 lb

## 2017-11-18 DIAGNOSIS — Z7982 Long term (current) use of aspirin: Secondary | ICD-10-CM

## 2017-11-18 DIAGNOSIS — C9000 Multiple myeloma not having achieved remission: Secondary | ICD-10-CM | POA: Diagnosis not present

## 2017-11-18 DIAGNOSIS — M542 Cervicalgia: Secondary | ICD-10-CM

## 2017-11-18 DIAGNOSIS — F419 Anxiety disorder, unspecified: Secondary | ICD-10-CM

## 2017-11-18 DIAGNOSIS — I252 Old myocardial infarction: Secondary | ICD-10-CM

## 2017-11-18 DIAGNOSIS — E538 Deficiency of other specified B group vitamins: Secondary | ICD-10-CM

## 2017-11-18 DIAGNOSIS — I209 Angina pectoris, unspecified: Secondary | ICD-10-CM

## 2017-11-18 DIAGNOSIS — G4730 Sleep apnea, unspecified: Secondary | ICD-10-CM

## 2017-11-18 DIAGNOSIS — R0602 Shortness of breath: Secondary | ICD-10-CM

## 2017-11-18 DIAGNOSIS — E119 Type 2 diabetes mellitus without complications: Secondary | ICD-10-CM

## 2017-11-18 DIAGNOSIS — Z87891 Personal history of nicotine dependence: Secondary | ICD-10-CM

## 2017-11-18 DIAGNOSIS — M129 Arthropathy, unspecified: Secondary | ICD-10-CM

## 2017-11-18 DIAGNOSIS — Z7189 Other specified counseling: Secondary | ICD-10-CM

## 2017-11-18 DIAGNOSIS — E785 Hyperlipidemia, unspecified: Secondary | ICD-10-CM

## 2017-11-18 DIAGNOSIS — Z7984 Long term (current) use of oral hypoglycemic drugs: Secondary | ICD-10-CM

## 2017-11-18 DIAGNOSIS — E1165 Type 2 diabetes mellitus with hyperglycemia: Secondary | ICD-10-CM

## 2017-11-18 DIAGNOSIS — M4800 Spinal stenosis, site unspecified: Secondary | ICD-10-CM

## 2017-11-18 DIAGNOSIS — Z8719 Personal history of other diseases of the digestive system: Secondary | ICD-10-CM

## 2017-11-18 DIAGNOSIS — Z8042 Family history of malignant neoplasm of prostate: Secondary | ICD-10-CM

## 2017-11-18 DIAGNOSIS — I2510 Atherosclerotic heart disease of native coronary artery without angina pectoris: Secondary | ICD-10-CM

## 2017-11-18 DIAGNOSIS — Z79899 Other long term (current) drug therapy: Secondary | ICD-10-CM

## 2017-11-18 DIAGNOSIS — Z9181 History of falling: Secondary | ICD-10-CM

## 2017-11-18 DIAGNOSIS — K5730 Diverticulosis of large intestine without perforation or abscess without bleeding: Secondary | ICD-10-CM

## 2017-11-18 DIAGNOSIS — Z86010 Personal history of colonic polyps: Secondary | ICD-10-CM

## 2017-11-18 DIAGNOSIS — I10 Essential (primary) hypertension: Secondary | ICD-10-CM

## 2017-11-18 DIAGNOSIS — F329 Major depressive disorder, single episode, unspecified: Secondary | ICD-10-CM | POA: Diagnosis not present

## 2017-11-18 DIAGNOSIS — K219 Gastro-esophageal reflux disease without esophagitis: Secondary | ICD-10-CM

## 2017-11-18 DIAGNOSIS — I509 Heart failure, unspecified: Secondary | ICD-10-CM

## 2017-11-18 LAB — COMPREHENSIVE METABOLIC PANEL
ALT: 34 U/L (ref 17–63)
AST: 47 U/L — ABNORMAL HIGH (ref 15–41)
Albumin: 3.6 g/dL (ref 3.5–5.0)
Alkaline Phosphatase: 87 U/L (ref 38–126)
Anion gap: 12 (ref 5–15)
BUN: 27 mg/dL — ABNORMAL HIGH (ref 6–20)
CO2: 19 mmol/L — ABNORMAL LOW (ref 22–32)
Calcium: 9.3 mg/dL (ref 8.9–10.3)
Chloride: 102 mmol/L (ref 101–111)
Creatinine, Ser: 0.94 mg/dL (ref 0.61–1.24)
GFR calc Af Amer: >60 mL/min (ref >60)
GFR calc non Af Amer: >60 mL/min (ref >60)
Glucose, Bld: 328 mg/dL — ABNORMAL HIGH (ref 65–99)
Potassium: 4.4 mmol/L (ref 3.5–5.1)
Sodium: 133 mmol/L — ABNORMAL LOW (ref 135–145)
Total Bilirubin: 0.9 mg/dL (ref 0.3–1.2)
Total Protein: 8.3 g/dL — ABNORMAL HIGH (ref 6.5–8.1)

## 2017-11-18 LAB — CBC WITH DIFFERENTIAL/PLATELET
Basophils Absolute: 0 10*3/uL (ref 0–0.1)
Basophils Relative: 0 %
Eosinophils Absolute: 0 10*3/uL (ref 0–0.7)
Eosinophils Relative: 0 %
HCT: 38.1 % — ABNORMAL LOW (ref 40.0–52.0)
Hemoglobin: 13.1 g/dL (ref 13.0–18.0)
Lymphocytes Relative: 12 %
Lymphs Abs: 0.7 10*3/uL — ABNORMAL LOW (ref 1.0–3.6)
MCH: 35.4 pg — ABNORMAL HIGH (ref 26.0–34.0)
MCHC: 34.4 g/dL (ref 32.0–36.0)
MCV: 102.8 fL — ABNORMAL HIGH (ref 80.0–100.0)
Monocytes Absolute: 0.4 10*3/uL (ref 0.2–1.0)
Monocytes Relative: 8 %
Neutro Abs: 4.8 10*3/uL (ref 1.4–6.5)
Neutrophils Relative %: 80 %
Platelets: 204 10*3/uL (ref 150–440)
RBC: 3.71 MIL/uL — ABNORMAL LOW (ref 4.40–5.90)
RDW: 13.6 % (ref 11.5–14.5)
WBC: 6 10*3/uL (ref 3.8–10.6)

## 2017-11-18 LAB — MAGNESIUM: Magnesium: 1.7 mg/dL (ref 1.7–2.4)

## 2017-11-18 NOTE — Progress Notes (Signed)
. [facility] day:  11/18/2017   Chief Complaint: Luis [name]. is a 72 y.o. male with multiple myeloma who is seen for 1 month assessment on Revlimid.  HPI:  The patient was last seen in the medical oncology clinic on 10/21/2017.  At that time, he felt "fairly good".  His spinal stenosis was acting up.  He completed his last course of Revlimid on 10/11/2017.  His next cycle of Revlimid was to start on 10/21/2017.  During the interim, patient is feeling "fine". He has been having problems with his blood sugars. Patient has cut his dietary sugar content. He states, "I just found out that orange juice has sugar in it. I have always thought that it was good for diabetics". Patient drinks "rum and Coke" and wine frequently. He states, "that has a lot of sugar in it too".  Wife states, "You will be cutting that out too".   Patient sustained a mechanical fall just before Christmas. Patient fell on his RIGHT side in the middle of the street. Patient complains of improving soreness in his neck. He notes that he had extensive bruising to his RIGHT lower extremity. Patient received corticosteroid injection in his spine on 11/17/2017 for pain related to his spinal stenosis. Patient denies pain in the clinic today.   Patient has been off of his Revlimid since 11/10/2017.                                                                                                                                                                                                                       Past Medical History:  Diagnosis Date  . Angina pectoris ([place])   . Anxiety   . Arthritis   . CHF (congestive heart failure) ([place])   . Coronary artery disease   . Depression   . Diabetes mellitus without complication [facility])    Patient takes Metformin.  . Diverticulosis   . GERD (gastroesophageal reflux disease)   . Hyperlipidemia   . Hypertension   . Multiple myeloma ([place])   .  Multiple myeloma ([place]) 03/27/2015  . Myocardial infarction [facility]) April 2001   widowmaker  . Shortness of breath dyspnea   . Sleep apnea    No CPAP @ present  . Spinal stenosis     Past Surgical History:  Procedure Laterality Date  . CARDIAC CATHETERIZATION    . CARPAL TUNNEL RELEASE    . CATARACT EXTRACTION    .  COLONOSCOPY WITH PROPOFOL N/A 11/11/2015   Procedure: COLONOSCOPY WITH PROPOFOL;  Surgeon: Lollie Sails, MD;  Location: [facility] ENDOSCOPY;  Service: Endoscopy;  Laterality: N/A;  . CORONARY ARTERY BYPASS GRAFT    . EYE SURGERY Bilateral    Cataract Extraction  . INGUINAL HERNIA REPAIR    . JOINT REPLACEMENT Right 2008   Right Total Hip Replacement  . PILONIDAL CYST EXCISION    . ROTATOR CUFF REPAIR    . TOTAL HIP ARTHROPLASTY Right   . VENTRAL HERNIA REPAIR N/A 08/15/2015   Procedure: VENTRAL HERNIA REPAIR WITH MESH ;  Surgeon: Leonie Green, MD;  Location: [facility] ORS;  Service: General;  Laterality: N/A;    Family History  Problem Relation Age of Onset  . Heart disease Father   . Stroke Mother   . Prostate cancer Maternal Grandfather 26    Social History:  reports that he quit smoking about 17 years ago. His smoking use included cigarettes. He has a 60.00 pack-year smoking history. He quit smokeless tobacco use about 17 years ago. He reports that he does not drink alcohol or use drugs.  He notes that his wife goes off to work.  He states that his wife would like to retire, but that would affect his medication coverage.  He has projects that he likes to do at home.  He does wood working.  His 42nd high school graduation anniversary was in 06/2016.  He has a new grand-daughter, Luis [name]. The patient is accompanied by his wife, Luis [name], today.  Allergies:  Allergies  Allergen Reactions  . Pravachol [Pravastatin]   . Pravastatin Sodium Other (See Comments)  . Statins Other (See Comments)    Muscle aches  . Zocor [Simvastatin] Other (See Comments)    Muscle aches     Current Medications: Current Outpatient Medications  Medication Sig Dispense Refill  . acetaminophen (TYLENOL) 500 MG tablet Take 500 mg by mouth every 6 (six) hours as needed.    . ALPRAZolam (XANAX) 0.25 MG tablet Take 0.25 mg by mouth at bedtime as needed. for sleep  3  . amoxicillin (AMOXIL) 875 MG tablet     . aspirin 81 MG tablet Take 81 mg by mouth daily.     [name] Pollen 500 MG CHEW Chew 1 tablet by mouth daily.    . Calcium Carbonate-Vitamin D 600-400 MG-UNIT chew tablet Chew 4 tablets by mouth daily. Takes them throughout the day for a total of 4    . Cyanocobalamin (VITAMIN B-12 IJ) Inject 1 Dose as directed every 30 (thirty) days.     . ergocalciferol (VITAMIN D2) 50000 UNITS capsule Take 50,000 Units by mouth once a week.    Marland Kitchen KLOR-CON M20 20 MEQ tablet TAKE 1 TABLET (20 MEQ TOTAL) BY MOUTH DAILY. 30 tablet 2  . lenalidomide (REVLIMID) 2.5 MG capsule TAKE 1 CAPSULE BY MOUTH ONCE DAILY FOR 21 DAYS ON THEN 7 DAYS OFF 21 capsule 0  . lisinopril (PRINIVIL,ZESTRIL) 20 MG tablet Take 1 tablet by mouth daily.    Marland Kitchen lovastatin (MEVACOR) 40 MG tablet Take 20 mg by mouth at bedtime. 2 tablets a day    . metFORMIN (GLUCOPHAGE) 500 MG tablet 500 mg daily with breakfast.     . omeprazole (PRILOSEC) 20 MG capsule Take 20 mg by mouth daily.    . traMADol (ULTRAM) 50 MG tablet TAKE 1 TABLET BY MOUTH 3 TIMES A DAY AS NEEDED 60 tablet 0  . Zoledronic Acid (ZOMETA) 4 MG/100ML IVPB  Inject into the vein.     No current facility-administered medications for this visit.     Review of Systems:  GENERAL:  Feels "good" today.  No fevers or sweats.  Weight stable. PERFORMANCE STATUS (ECOG): 1 HEENT: No visual changes, runny nose, sore throat, mouth sores or tenderness. Lungs:  No shortness of breath.  Dry cough. No hemoptysis. Cardiac: No chest pain, palpitations, orthopnea, or PND.  Pre-op echo and stress test good. GI: Eating well.  No nausea, vomiting, diarrhea, constipation, melena or  hematochezia. GU: No urgency, frequency, dysuria, or hematuria. Musculoskeletal: Spinal stenosis causes legs aches and hip pain.  No joint pain. No muscle tenderness. Extremities: No pain or swelling. Skin: Skin bleeds easily.  No rashes or skin changes. Neuro: Spinal stenosis s/p injection.  No headache, numbness or weakness, balance or coordination issues. Injection for spinal stenosis. Endocrine: Diabetes.  Elevated blood sugar (see HPI).  No thyroid issues, hot flashes or night sweats. Psych: No mood changes, depression or anxiety. He is a "night owl". Pain: No focal pain. Review of systems: All other systems reviewed and found to be negative.  Physical Exam: Blood pressure (!) 148/76, pulse 71, temperature (!) 97.4 F (36.3 C), temperature source Tympanic, weight 220 lb 6 oz (100 kg), SpO2 95 %.  GENERAL: Well developed, well nourished, elderly gentleman sitting comfortably in the exam room in no acute distress.  He has a cane at his side. MENTAL STATUS: Alert and oriented to person, place and time. HEAD: Short white hair. Male pattern baldness. Normocephalic, atraumatic, face symmetric, no Cushingoid features. EYES: Blue eyes. Pupils equal round and reactive to light and accomodation. No conjunctivitis or scleral icterus. ENT: Oropharynx clear without lesion. Tongue normal. Mucous membranes moist.  RESPIRATORY: Clear to auscultation without rales, wheezes or rhonchi. CARDIOVASCULAR: Regular rate and rhythm without murmur, rub or gallop. ABDOMEN: Soft, non-tender, with active bowel sounds, and no hepatosplenomegaly. No guarding or rebound tenderness.  No masses. SKIN: No rashes, ulcers or lesions. EXTREMITIES: No edema, no skin discoloration or tenderness. No palpable cords. LYMPH NODES: No palpable cervical, supraclavicular, axillary or inguinal adenopathy  NEUROLOGICAL: Unremarkable. PSYCH: Appropriate   Appointment on 11/18/2017  Component Date Value  Ref Range Status  . Magnesium 11/18/2017 1.7  1.7 - 2.4 mg/dL Final   Performed at [facility], [address], [place], [place] [address]  . Sodium 11/18/2017 133* 135 - 145 mmol/L Final  . Potassium 11/18/2017 4.4  3.5 - 5.1 mmol/L Final  . Chloride 11/18/2017 102  101 - 111 mmol/L Final  . CO2 11/18/2017 19* 22 - 32 mmol/L Final  . Glucose, Bld 11/18/2017 328* 65 - 99 mg/dL Final  . BUN 11/18/2017 27* 6 - 20 mg/dL Final  . Creatinine, Ser 11/18/2017 0.94  0.61 - 1.24 mg/dL Final  . Calcium 11/18/2017 9.3  8.9 - 10.3 mg/dL Final  . Total Protein 11/18/2017 8.3* 6.5 - 8.1 g/dL Final  . Albumin 11/18/2017 3.6  3.5 - 5.0 g/dL Final  . AST 11/18/2017 47* 15 - 41 U/L Final  . ALT 11/18/2017 34  17 - 63 U/L Final  . Alkaline Phosphatase 11/18/2017 87  38 - 126 U/L Final  . Total Bilirubin 11/18/2017 0.9  0.3 - 1.2 mg/dL Final  . GFR calc non Af Amer 11/18/2017 >60  >60 mL/min Final  . GFR calc Af Amer 11/18/2017 >60  >60 mL/min Final   Comment: (NOTE) The eGFR has been calculated using the CKD EPI equation. This calculation  has not been validated in all clinical situations. eGFR's persistently <60 mL/min signify possible Chronic Kidney Disease.   Georgiann Hahn gap 11/18/2017 12  5 - 15 Final   Performed at [facility], [address] [place], [place] [address]  . WBC 11/18/2017 6.0  3.8 - 10.6 K/uL Final  . RBC 11/18/2017 3.71* 4.40 - 5.90 MIL/uL Final  . Hemoglobin 11/18/2017 13.1  13.0 - 18.0 g/dL Final  . HCT 11/18/2017 38.1* 40.0 - 52.0 % Final  . MCV 11/18/2017 102.8* 80.0 - 100.0 fL Final  . MCH 11/18/2017 35.4* 26.0 - 34.0 pg Final  . MCHC 11/18/2017 34.4  32.0 - 36.0 g/dL Final  . RDW 11/18/2017 13.6  11.5 - 14.5 % Final  . Platelets 11/18/2017 204  150 - 440 K/uL Final  . Neutrophils Relative % 11/18/2017 80  % Final  . Neutro Abs 11/18/2017 4.8  1.4 - 6.5 K/uL Final  . Lymphocytes Relative 11/18/2017 12  % Final  . Lymphs Abs 11/18/2017 0.7* 1.0 - 3.6 K/uL  Final  . Monocytes Relative 11/18/2017 8  % Final  . Monocytes Absolute 11/18/2017 0.4  0.2 - 1.0 K/uL Final  . Eosinophils Relative 11/18/2017 0  % Final  . Eosinophils Absolute 11/18/2017 0.0  0 - 0.7 K/uL Final  . Basophils Relative 11/18/2017 0  % Final  . Basophils Absolute 11/18/2017 0.0  0 - 0.1 K/uL Final   Performed at [facility], [address] [place], [place] [address]    Assessment:  Luis Marcil. is a 72 y.o. male with stage III multiple myeloma. He presented in 02/2013 with left-sided sharp pain. Evaluation revealed wide-spread lytic lesions including fracture of the left 5th rib laterally. CT scans on 02/22/2013 showed innumerable lytic lesions in thoracic spine, sternum, clavicle, scapula, and ribs.   Bone marrow biopsy on 03/13/2013 revealed 15 to 20% plasma cells. Iron stores were absent. Renal function was normal. SPEP on 03/01/2013 revealed a 1.4 gm/dL IgG monoclonal lambda. IgG was 1987.   He began induction with Revlimid 25 mg a day (3 weeks on and 1 week off) and Decadron (40 mg once a week) on 04/09/2013. SPEP has revealed no monoclonal protein since 07/26/2014 (last check 10/21/2017). IgG was 1362 on 07/26/2014 and 1097 on 12/19/2014.   Lambda free light chains have been monitored: 33.20 (ratio of 1.56) on 09/20/2014, 31.51 (ratio of 1.43) on 12/19/2014, 30.25 (ratio of 1.56) on 05/01/2015, 31.95 (ratio 1.49) on 01/01/2016, 28.02 (ratio 1.29) on 03/25/2016, 31.5 (ratio 1.26) on 05/28/2016, 21.8 (ratio 1.35) on 08/20/2016, 37.3 (ratio 1.09) on 10/22/2016, and 46.3 (ratio 1.49) on 10/21/2017.  24 hour UPEP on 12/02/2015 revealed no monoclonal protein.  With remission, Revlimid was decreased to 10 mg a day then to 10 mg every other day secondary to issues with renal function and diarrhea.  Current Revlimid is 2.5 mg a day (3 weeks on/1 week off).  Last cycle of Revlimid began on 09/19/2017.  Bone survey on 05/30/2015 revealed the majority of  small lytic lesions were stable.  There was possibly new lesion in the distal left clavicle and right mid femur (small) and a possible developing lucency in the proximal left femoral shaft.  The calvarial lesion noted on the prior study was not well seen (? positional).  Bone survey on 11/27/2015 revealed widespread bony lytic lesions consistent with multiple myeloma.  The vast majority of lesions were stable.  A few small lesions were not previously seen.  One  lesion was previously obscured by bowel contents.  Two small lesions in the right distal femoral diaphysis appeared new.  Bone survey on 05/25/2016 revealed no evidence of progressive myelomatous involvement of the skeleton.  Bone survey on 05/16/2017 revealed multiple lucent lesions as previously seen. There was no convincing evidence of progression or superimposed abnormality since prior study.  He received Zometa every 3 months (last 02/28/2017). He takes calcium 4 pills/day. He receives B12 monthly (last 11/11/2017). B12 was 370 on 01/17/2015.  Folate was 17.5 on 09/17/2016.  Abdominal and pelvic CT scan on 07/15/2015 revealed  extensive sigmoid diverticulosis. There was questionable wall thickening of the ascending colon versus artifact from incomplete distention.  Colonoscopy on 11/11/2015 revealed diverticulosis in the sigmoid colon and distal descending colon and ascending colon.  There was a 2 mm polyp in the mid sigmoid colon.  Pathology revealed a hyperplastic polyp, negative for dysplasia or malignancy.  Symptomatically, he is doing well.  He suffered a recent fall and has soreness in his neck. Exam is stable. WBC is 6000 with an [place] of 4800. Hemoglobin is 13.1, hematocrit 38.1, platelets 204,000.   Plan: 1.  Labs today:  CBC with diff, CMP, Mg. 2.  Continue B12 monthly (due 12/09/2016). 3.  Start next cycle of Revlimid when available (oral chemotherapy pharmacists to assist). 4.  RTC in 1 month for MD assessment and labs (CBC with  diff, CMP, Mg, SPEP, FLCA).   Honor Loh, NP 11/18/2017, 11:19 AM   I saw and evaluated the patient, participating in the key portions of the service and reviewing pertinent diagnostic studies and records.  I reviewed the nurse practitioner's note and agree with the findings and the plan.  The assessment and plan were discussed with the patient.  A few questions were asked by the patient and answered.   Nolon Stalls, MD 11/18/2017,11:19 AM

## 2017-11-18 NOTE — Progress Notes (Signed)
Patient states he had a fall on December 27th.  States he went to get trash cans in and fell on his right side.  States he is bruised from his right hip down to his knee.  Patient states he had a cortisone injection yesterday for his spinal stenosis.  Offers no other complaints today.  Patient is accompanied by his wife today.

## 2017-11-20 ENCOUNTER — Encounter: Payer: Self-pay | Admitting: Hematology and Oncology

## 2017-12-05 ENCOUNTER — Other Ambulatory Visit: Payer: Self-pay | Admitting: Urgent Care

## 2017-12-05 DIAGNOSIS — C9000 Multiple myeloma not having achieved remission: Secondary | ICD-10-CM

## 2017-12-09 ENCOUNTER — Inpatient Hospital Stay: Payer: Medicare Other | Attending: Hematology and Oncology

## 2017-12-09 ENCOUNTER — Telehealth: Payer: Self-pay | Admitting: Hematology and Oncology

## 2017-12-09 DIAGNOSIS — Z7984 Long term (current) use of oral hypoglycemic drugs: Secondary | ICD-10-CM | POA: Diagnosis not present

## 2017-12-09 DIAGNOSIS — Z9181 History of falling: Secondary | ICD-10-CM | POA: Insufficient documentation

## 2017-12-09 DIAGNOSIS — I110 Hypertensive heart disease with heart failure: Secondary | ICD-10-CM | POA: Insufficient documentation

## 2017-12-09 DIAGNOSIS — E538 Deficiency of other specified B group vitamins: Secondary | ICD-10-CM | POA: Insufficient documentation

## 2017-12-09 DIAGNOSIS — E785 Hyperlipidemia, unspecified: Secondary | ICD-10-CM | POA: Diagnosis not present

## 2017-12-09 DIAGNOSIS — I509 Heart failure, unspecified: Secondary | ICD-10-CM | POA: Insufficient documentation

## 2017-12-09 DIAGNOSIS — K219 Gastro-esophageal reflux disease without esophagitis: Secondary | ICD-10-CM | POA: Diagnosis not present

## 2017-12-09 DIAGNOSIS — R0602 Shortness of breath: Secondary | ICD-10-CM | POA: Insufficient documentation

## 2017-12-09 DIAGNOSIS — G4730 Sleep apnea, unspecified: Secondary | ICD-10-CM | POA: Insufficient documentation

## 2017-12-09 DIAGNOSIS — F329 Major depressive disorder, single episode, unspecified: Secondary | ICD-10-CM | POA: Insufficient documentation

## 2017-12-09 DIAGNOSIS — E119 Type 2 diabetes mellitus without complications: Secondary | ICD-10-CM | POA: Insufficient documentation

## 2017-12-09 DIAGNOSIS — I252 Old myocardial infarction: Secondary | ICD-10-CM | POA: Diagnosis not present

## 2017-12-09 DIAGNOSIS — M4800 Spinal stenosis, site unspecified: Secondary | ICD-10-CM | POA: Insufficient documentation

## 2017-12-09 DIAGNOSIS — I25119 Atherosclerotic heart disease of native coronary artery with unspecified angina pectoris: Secondary | ICD-10-CM | POA: Diagnosis not present

## 2017-12-09 DIAGNOSIS — Z79899 Other long term (current) drug therapy: Secondary | ICD-10-CM | POA: Insufficient documentation

## 2017-12-09 DIAGNOSIS — Z8719 Personal history of other diseases of the digestive system: Secondary | ICD-10-CM | POA: Diagnosis not present

## 2017-12-09 DIAGNOSIS — C9000 Multiple myeloma not having achieved remission: Secondary | ICD-10-CM | POA: Diagnosis not present

## 2017-12-09 DIAGNOSIS — Z7982 Long term (current) use of aspirin: Secondary | ICD-10-CM | POA: Diagnosis not present

## 2017-12-09 DIAGNOSIS — Z87891 Personal history of nicotine dependence: Secondary | ICD-10-CM | POA: Insufficient documentation

## 2017-12-09 MED ORDER — CYANOCOBALAMIN 1000 MCG/ML IJ SOLN
1000.0000 ug | Freq: Once | INTRAMUSCULAR | Status: AC
  Administered 2017-12-09: 1000 ug via INTRAMUSCULAR

## 2017-12-09 NOTE — Telephone Encounter (Signed)
Oral Oncology Patient Advocate Encounter   Meet patient in lobby so he could show me some paper work it was from [employer]. The letter stated that his grant had closed. He fills at [facility] and on 08/31/2017 I called them and gave them the information for his $7,000.00 grant. When I called [facility] I was told there was a note about it but was never put in the system to bill.   Called patient to let him know when PAF runs out I will find him another grant. He was fine.   [place] Patient Advocate [phone] 12/09/2017 1:31 PM

## 2017-12-16 ENCOUNTER — Telehealth: Payer: Self-pay | Admitting: Pharmacist

## 2017-12-16 ENCOUNTER — Inpatient Hospital Stay: Payer: Medicare Other

## 2017-12-16 ENCOUNTER — Other Ambulatory Visit: Payer: Self-pay

## 2017-12-16 ENCOUNTER — Encounter: Payer: Self-pay | Admitting: Hematology and Oncology

## 2017-12-16 ENCOUNTER — Inpatient Hospital Stay (HOSPITAL_BASED_OUTPATIENT_CLINIC_OR_DEPARTMENT_OTHER): Payer: Medicare Other | Admitting: Hematology and Oncology

## 2017-12-16 VITALS — BP 133/69 | HR 66 | Temp 97.3°F | Wt 217.4 lb

## 2017-12-16 DIAGNOSIS — I110 Hypertensive heart disease with heart failure: Secondary | ICD-10-CM | POA: Diagnosis not present

## 2017-12-16 DIAGNOSIS — I25119 Atherosclerotic heart disease of native coronary artery with unspecified angina pectoris: Secondary | ICD-10-CM | POA: Diagnosis not present

## 2017-12-16 DIAGNOSIS — E785 Hyperlipidemia, unspecified: Secondary | ICD-10-CM | POA: Diagnosis not present

## 2017-12-16 DIAGNOSIS — Z79899 Other long term (current) drug therapy: Secondary | ICD-10-CM | POA: Diagnosis not present

## 2017-12-16 DIAGNOSIS — Z9181 History of falling: Secondary | ICD-10-CM

## 2017-12-16 DIAGNOSIS — I509 Heart failure, unspecified: Secondary | ICD-10-CM

## 2017-12-16 DIAGNOSIS — K219 Gastro-esophageal reflux disease without esophagitis: Secondary | ICD-10-CM | POA: Diagnosis not present

## 2017-12-16 DIAGNOSIS — E119 Type 2 diabetes mellitus without complications: Secondary | ICD-10-CM

## 2017-12-16 DIAGNOSIS — Z7984 Long term (current) use of oral hypoglycemic drugs: Secondary | ICD-10-CM

## 2017-12-16 DIAGNOSIS — I252 Old myocardial infarction: Secondary | ICD-10-CM

## 2017-12-16 DIAGNOSIS — F329 Major depressive disorder, single episode, unspecified: Secondary | ICD-10-CM | POA: Diagnosis not present

## 2017-12-16 DIAGNOSIS — C9000 Multiple myeloma not having achieved remission: Secondary | ICD-10-CM | POA: Diagnosis not present

## 2017-12-16 DIAGNOSIS — R0602 Shortness of breath: Secondary | ICD-10-CM

## 2017-12-16 DIAGNOSIS — M4800 Spinal stenosis, site unspecified: Secondary | ICD-10-CM

## 2017-12-16 DIAGNOSIS — Z7982 Long term (current) use of aspirin: Secondary | ICD-10-CM

## 2017-12-16 DIAGNOSIS — Z8719 Personal history of other diseases of the digestive system: Secondary | ICD-10-CM

## 2017-12-16 DIAGNOSIS — G4730 Sleep apnea, unspecified: Secondary | ICD-10-CM

## 2017-12-16 DIAGNOSIS — E538 Deficiency of other specified B group vitamins: Secondary | ICD-10-CM | POA: Diagnosis not present

## 2017-12-16 DIAGNOSIS — Z87891 Personal history of nicotine dependence: Secondary | ICD-10-CM

## 2017-12-16 DIAGNOSIS — Z7189 Other specified counseling: Secondary | ICD-10-CM

## 2017-12-16 LAB — CBC WITH DIFFERENTIAL/PLATELET
Basophils Absolute: 0.1 10*3/uL (ref 0–0.1)
Basophils Relative: 3 %
Eosinophils Absolute: 0.2 10*3/uL (ref 0–0.7)
Eosinophils Relative: 6 %
HCT: 36.4 % — ABNORMAL LOW (ref 40.0–52.0)
Hemoglobin: 12.6 g/dL — ABNORMAL LOW (ref 13.0–18.0)
Lymphocytes Relative: 38 %
Lymphs Abs: 1.1 10*3/uL (ref 1.0–3.6)
MCH: 35.5 pg — ABNORMAL HIGH (ref 26.0–34.0)
MCHC: 34.7 g/dL (ref 32.0–36.0)
MCV: 102.1 fL — ABNORMAL HIGH (ref 80.0–100.0)
Monocytes Absolute: 0.5 10*3/uL (ref 0.2–1.0)
Monocytes Relative: 16 %
Neutro Abs: 1.1 10*3/uL — ABNORMAL LOW (ref 1.4–6.5)
Neutrophils Relative %: 37 %
Platelets: 157 10*3/uL (ref 150–440)
RBC: 3.56 MIL/uL — ABNORMAL LOW (ref 4.40–5.90)
RDW: 13.7 % (ref 11.5–14.5)
WBC: 2.9 10*3/uL — ABNORMAL LOW (ref 3.8–10.6)

## 2017-12-16 LAB — COMPREHENSIVE METABOLIC PANEL
ALT: 24 U/L (ref 17–63)
AST: 35 U/L (ref 15–41)
Albumin: 3.6 g/dL (ref 3.5–5.0)
Alkaline Phosphatase: 48 U/L (ref 38–126)
Anion gap: 10 (ref 5–15)
BUN: 18 mg/dL (ref 6–20)
CO2: 21 mmol/L — ABNORMAL LOW (ref 22–32)
Calcium: 9.1 mg/dL (ref 8.9–10.3)
Chloride: 106 mmol/L (ref 101–111)
Creatinine, Ser: 0.99 mg/dL (ref 0.61–1.24)
GFR calc Af Amer: >60 mL/min (ref >60)
GFR calc non Af Amer: >60 mL/min (ref >60)
Glucose, Bld: 147 mg/dL — ABNORMAL HIGH (ref 65–99)
Potassium: 4.1 mmol/L (ref 3.5–5.1)
Sodium: 137 mmol/L (ref 135–145)
Total Bilirubin: 0.8 mg/dL (ref 0.3–1.2)
Total Protein: 7.5 g/dL (ref 6.5–8.1)

## 2017-12-16 LAB — MAGNESIUM: Magnesium: 1.6 mg/dL — ABNORMAL LOW (ref 1.7–2.4)

## 2017-12-16 NOTE — Telephone Encounter (Signed)
Oral Chemotherapy Pharmacist Encounter   Called [facility] to check on Mr. Reagle Revlimid prescription. [facility] stated that his Revlimid is ready to go and they reach out to Mr. Altschuler via text,call, and email but had not heard back about filling his Rx. I asked that they give him another call today.  I also called Mr. Derego, gave him the above information, and also provided him with the phone number for [facility] if he wanted to call ahead of them calling him.   Darl Pikes, PharmD, BCPS Hematology/Oncology Clinical Pharmacist [facility] [phone]  12/16/2017 11:53 AM

## 2017-12-16 NOTE — Progress Notes (Signed)
. [place] [facility] day:  12/16/2017   Chief Complaint: Luis Clear. is a 72 y.o. male with multiple myeloma who is seen for 1 month assessment on Revlimid.  HPI:  The patient was last seen in the medical oncology clinic on 11/18/2017.  At that time, he was doing well.  He had suffered a recent fall and had soreness in his neck. Exam was stable. WBC was 6000 with an [place] of 4800. Hemoglobin was 13.1, hematocrit 38.1, platelets 204,000.  He had been off of his Revlimid since 11/10/2017.  He was to start his next cycle when drug became available.  He received B12 on 12/09/2017.  During the interim, he has done "well".  Last cycle of Revlimid was from 11/19/2017 to 12/09/2017.  He has not received his next course of Revlimid.  He denies any interval infections or bone pain.  He describes working on his "sugar".                                                                                                                                                                                                                        Past Medical History:  Diagnosis Date  . Angina pectoris ([place])   . Anxiety   . Arthritis   . CHF (congestive heart failure) ([place])   . Coronary artery disease   . Depression   . Diabetes mellitus without complication [facility])    Patient takes Metformin.  . Diverticulosis   . GERD (gastroesophageal reflux disease)   . Hyperlipidemia   . Hypertension   . Multiple myeloma ([place])   . Multiple myeloma ([place]) 03/27/2015  . Myocardial infarction [facility]) April 2001   widowmaker  . Shortness of breath dyspnea   . Sleep apnea    No CPAP @ present  . Spinal stenosis     Past Surgical History:  Procedure Laterality Date  . CARDIAC CATHETERIZATION    . CARPAL TUNNEL RELEASE    . CATARACT EXTRACTION    . COLONOSCOPY WITH PROPOFOL N/A 11/11/2015   Procedure: COLONOSCOPY WITH PROPOFOL;  Surgeon: Lollie Sails, MD;  Location: [facility] ENDOSCOPY;   Service: Endoscopy;  Laterality: N/A;  . CORONARY ARTERY BYPASS GRAFT    . EYE SURGERY Bilateral    Cataract Extraction  . INGUINAL HERNIA REPAIR    . JOINT REPLACEMENT Right 2008   Right Total Hip Replacement  . PILONIDAL CYST EXCISION    . ROTATOR  CUFF REPAIR    . TOTAL HIP ARTHROPLASTY Right   . VENTRAL HERNIA REPAIR N/A 08/15/2015   Procedure: VENTRAL HERNIA REPAIR WITH MESH ;  Surgeon: Leonie Green, MD;  Location: [facility] ORS;  Service: General;  Laterality: N/A;    Family History  Problem Relation Age of Onset  . Heart disease Father   . Stroke Mother   . Prostate cancer Maternal Grandfather 3    Social History:  reports that he quit smoking about 17 years ago. His smoking use included cigarettes. He has a 60.00 pack-year smoking history. He quit smokeless tobacco use about 17 years ago. He reports that he does not drink alcohol or use drugs.  He notes that his wife goes off to work.  He states that his wife would like to retire, but that would affect his medication coverage.  He has projects that he likes to do at home.  He does wood working.  His 42nd high school graduation anniversary was in 06/2016.  He has a new grand-daughter, [name]. The patient is accompanied by his wife, Luis [name], today.  Allergies:  Allergies  Allergen Reactions  . Pravachol [Pravastatin]   . Pravastatin Sodium Other (See Comments)  . Statins Other (See Comments)    Muscle aches  . Zocor [Simvastatin] Other (See Comments)    Muscle aches    Current Medications: Current Outpatient Medications  Medication Sig Dispense Refill  . acetaminophen (TYLENOL) 500 MG tablet Take 500 mg by mouth every 6 (six) hours as needed.    . ALPRAZolam (XANAX) 0.25 MG tablet Take 0.25 mg by mouth at bedtime as needed. for sleep  3  . aspirin 81 MG tablet Take 81 mg by mouth daily.     [name] Pollen 500 MG CHEW Chew 1 tablet by mouth daily.    . Calcium Carbonate-Vitamin D 600-400 MG-UNIT chew tablet Chew 4  tablets by mouth daily. Takes them throughout the day for a total of 4    . Cyanocobalamin (VITAMIN B-12 IJ) Inject 1 Dose as directed every 30 (thirty) days.     . ergocalciferol (VITAMIN D2) 50000 UNITS capsule Take 50,000 Units by mouth once a week.    Marland Kitchen KLOR-CON M20 20 MEQ tablet TAKE 1 TABLET (20 MEQ TOTAL) BY MOUTH DAILY. 30 tablet 2  . lovastatin (MEVACOR) 40 MG tablet Take 20 mg by mouth at bedtime. 2 tablets a day    . metFORMIN (GLUCOPHAGE) 500 MG tablet 500 mg daily with breakfast.     . omeprazole (PRILOSEC) 20 MG capsule Take 20 mg by mouth daily.    . traMADol (ULTRAM) 50 MG tablet TAKE 1 TABLET BY MOUTH 3 TIMES A DAY AS NEEDED 60 tablet 0  . amoxicillin (AMOXIL) 500 MG capsule TAKE 4 CAPSULES 1 HOUR PRIOR TO DENTAL APPT.    Marland Kitchen lenalidomide (REVLIMID) 2.5 MG capsule TAKE 1 CAPSULE BY MOUTH ONCE DAILY FOR 21 DAYS ON THEN 7 DAYS OFF (Patient not taking: Reported on 12/16/2017) 21 capsule 0  . lisinopril (PRINIVIL,ZESTRIL) 20 MG tablet Take 1 tablet by mouth daily.    Marland Kitchen REVLIMID 2.5 MG capsule TAKE 1 CAPSULE BY MOUTH ONCE DAILY FOR 21 DAYS ON THEN 7 DAYS OFF (Patient not taking: Reported on 12/16/2017) 21 capsule 0  . Zoledronic Acid (ZOMETA) 4 MG/100ML IVPB Inject into the vein.     No current facility-administered medications for this visit.     Review of Systems:  GENERAL:  Feels "well".  No fevers  or sweats.  Weight down 3 pounds. PERFORMANCE STATUS (ECOG): 1 HEENT: No visual changes, runny nose, sore throat, mouth sores or tenderness. Lungs:  No shortness of breath.  Dry cough. No hemoptysis. Cardiac: No chest pain, palpitations, orthopnea, or PND.  Pre-op echo and stress test good. GI: Eating well.  No nausea, vomiting, diarrhea, constipation, melena or hematochezia. GU: No urgency, frequency, dysuria, or hematuria. Musculoskeletal: Spinal stenosis causes legs aches and hip pain.  No joint pain. No muscle tenderness. Extremities: No pain or swelling. Skin: Skin  bleeds easily.  No rashes or skin changes. Neuro: Spinal stenosis s/p injection.  No headache, numbness or weakness, balance or coordination issues. Injection for spinal stenosis. Endocrine: Diabetes.  Elevated blood sugar (see HPI).  No thyroid issues, hot flashes or night sweats. Psych: No mood changes, depression or anxiety. He is a "night owl". Pain: No focal pain. Review of systems: All other systems reviewed and found to be negative.  Physical Exam: Blood pressure 133/69, pulse 66, temperature (!) 97.3 F (36.3 C), temperature source Tympanic, weight 217 lb 6.4 oz (98.6 kg).  GENERAL: Well developed, well nourished, elderly gentleman sitting comfortably in the exam room in no acute distress.  He has a cane at his side. MENTAL STATUS: Alert and oriented to person, place and time. HEAD: Short white hair. Male pattern baldness. Normocephalic, atraumatic, face symmetric, no Cushingoid features. EYES: Blue eyes. Pupils equal round and reactive to light and accomodation. No conjunctivitis or scleral icterus. ENT: Oropharynx clear without lesion. Tongue normal. Mucous membranes moist.  RESPIRATORY: Clear to auscultation without rales, wheezes or rhonchi. CARDIOVASCULAR: Regular rate and rhythm without murmur, rub or gallop. ABDOMEN: Soft, non-tender, with active bowel sounds, and no hepatosplenomegaly. No guarding or rebound tenderness.  No masses. SKIN: No rashes, ulcers or lesions. EXTREMITIES: No edema, no skin discoloration or tenderness. No palpable cords. LYMPH NODES: No palpable cervical, supraclavicular, axillary or inguinal adenopathy  NEUROLOGICAL: Unremarkable. PSYCH: Appropriate   Appointment on 12/16/2017  Component Date Value Ref Range Status  . Magnesium 12/16/2017 1.6* 1.7 - 2.4 mg/dL Final   Performed at [facility], [address] [place], [place] [address]  . Sodium 12/16/2017 137  135 - 145 mmol/L Final  . Potassium 12/16/2017 4.1  3.5  - 5.1 mmol/L Final  . Chloride 12/16/2017 106  101 - 111 mmol/L Final  . CO2 12/16/2017 21* 22 - 32 mmol/L Final  . Glucose, Bld 12/16/2017 147* 65 - 99 mg/dL Final  . BUN 12/16/2017 18  6 - 20 mg/dL Final  . Creatinine, Ser 12/16/2017 0.99  0.61 - 1.24 mg/dL Final  . Calcium 12/16/2017 9.1  8.9 - 10.3 mg/dL Final  . Total Protein 12/16/2017 7.5  6.5 - 8.1 g/dL Final  . Albumin 12/16/2017 3.6  3.5 - 5.0 g/dL Final  . AST 12/16/2017 35  15 - 41 U/L Final  . ALT 12/16/2017 24  17 - 63 U/L Final  . Alkaline Phosphatase 12/16/2017 48  38 - 126 U/L Final  . Total Bilirubin 12/16/2017 0.8  0.3 - 1.2 mg/dL Final  . GFR calc non Af Amer 12/16/2017 >60  >60 mL/min Final  . GFR calc Af Amer 12/16/2017 >60  >60 mL/min Final   Comment: (NOTE) The eGFR has been calculated using the CKD EPI equation. This calculation has not been validated in all clinical situations. eGFR's persistently <60 mL/min signify possible Chronic Kidney Disease.   . Anion gap 12/16/2017 10  5 - 15 Final  Performed at [facility], [address], [place], [place] [address]  . WBC 12/16/2017 2.9* 3.8 - 10.6 K/uL Final  . RBC 12/16/2017 3.56* 4.40 - 5.90 MIL/uL Final  . Hemoglobin 12/16/2017 12.6* 13.0 - 18.0 g/dL Final  . HCT 12/16/2017 36.4* 40.0 - 52.0 % Final  . MCV 12/16/2017 102.1* 80.0 - 100.0 fL Final  . MCH 12/16/2017 35.5* 26.0 - 34.0 pg Final  . MCHC 12/16/2017 34.7  32.0 - 36.0 g/dL Final  . RDW 12/16/2017 13.7  11.5 - 14.5 % Final  . Platelets 12/16/2017 157  150 - 440 K/uL Final  . Neutrophils Relative % 12/16/2017 37  % Final  . Neutro Abs 12/16/2017 1.1* 1.4 - 6.5 K/uL Final  . Lymphocytes Relative 12/16/2017 38  % Final  . Lymphs Abs 12/16/2017 1.1  1.0 - 3.6 K/uL Final  . Monocytes Relative 12/16/2017 16  % Final  . Monocytes Absolute 12/16/2017 0.5  0.2 - 1.0 K/uL Final  . Eosinophils Relative 12/16/2017 6  % Final  . Eosinophils Absolute 12/16/2017 0.2  0 - 0.7 K/uL Final  . Basophils  Relative 12/16/2017 3  % Final  . Basophils Absolute 12/16/2017 0.1  0 - 0.1 K/uL Final   Performed at [facility], [address] [place],  [address]    Assessment:  Luis Basso. is a 72 y.o. male with stage III multiple myeloma. He presented in 02/2013 with left-sided sharp pain. Evaluation revealed wide-spread lytic lesions including fracture of the left 5th rib laterally. CT scans on 02/22/2013 showed innumerable lytic lesions in thoracic spine, sternum, clavicle, scapula, and ribs.   Bone marrow biopsy on 03/13/2013 revealed 15 to 20% plasma cells. Iron stores were absent. Renal function was normal. SPEP on 03/01/2013 revealed a 1.4 gm/dL IgG monoclonal lambda. IgG was 1987.   He began induction with Revlimid 25 mg a day (3 weeks on and 1 week off) and Decadron (40 mg once a week) on 04/09/2013. SPEP has revealed no monoclonal protein since 07/26/2014 (last check 10/21/2017). IgG was 1362 on 07/26/2014 and 1097 on 12/19/2014.   Lambda free light chains have been monitored: 33.20 (ratio of 1.56) on 09/20/2014, 31.51 (ratio of 1.43) on 12/19/2014, 30.25 (ratio of 1.56) on 05/01/2015, 31.95 (ratio 1.49) on 01/01/2016, 28.02 (ratio 1.29) on 03/25/2016, 31.5 (ratio 1.26) on 05/28/2016, 21.8 (ratio 1.35) on 08/20/2016, 37.3 (ratio 1.09) on 10/22/2016, and 46.3 (ratio 1.49) on 10/21/2017.  24 hour UPEP on 12/02/2015 revealed no monoclonal protein.  With remission, Revlimid was decreased to 10 mg a day then to 10 mg every other day secondary to issues with renal function and diarrhea.  Current Revlimid is 2.5 mg a day (3 weeks on/1 week off).  Last cycle of Revlimid began on 11/19/2017.  Bone survey on 05/30/2015 revealed the majority of small lytic lesions were stable.  There was possibly new lesion in the distal left clavicle and right mid femur (small) and a possible developing lucency in the proximal left femoral shaft.  The calvarial lesion noted on the prior study  was not well seen (? positional).  Bone survey on 11/27/2015 revealed widespread bony lytic lesions consistent with multiple myeloma.  The vast majority of lesions were stable.  A few small lesions were not previously seen.  One lesion was previously obscured by bowel contents.  Two small lesions in the right distal femoral diaphysis appeared new.  Bone survey on 05/25/2016 revealed no evidence of progressive myelomatous involvement of the  skeleton.  Bone survey on 05/16/2017 revealed multiple lucent lesions as previously seen. There was no convincing evidence of progression or superimposed abnormality since prior study.  He received Zometa every 3 months (last 02/28/2017). He takes calcium 4 pills/day. He receives B12 monthly (last 12/09/2017). B12 was 370 on 01/17/2015.  Folate was 17.5 on 09/17/2016.  Abdominal and pelvic CT scan on 07/15/2015 revealed  extensive sigmoid diverticulosis. There was questionable wall thickening of the ascending colon versus artifact from incomplete distention.  Colonoscopy on 11/11/2015 revealed diverticulosis in the sigmoid colon and distal descending colon and ascending colon.  There was a 2 mm polyp in the mid sigmoid colon.  Pathology revealed a hyperplastic polyp, negative for dysplasia or malignancy.  Symptomatically, he is doing well.  Exam is stable. WBC is 2900 with an [place] of 1100. Hemoglobin is 12.6, hematocrit 36.3, platelets 157,000.  Magnesium is 1.6.  Plan: 1.  Labs today:  CBC with diff, CMP, Mg, SPEP, FLCA. 2.  Continue B12 monthly. 3.  Discuss low ANC.  Discuss postponing start of next Revlimid cycle x 1 week. 4.  Start next cycle of Revlimid on 12/23/2017. 5.  RTC in 5 weeks for MD assessment, labs (CBC with diff, CMP, Mg), and B12 injection.    Honor Loh, NP 12/16/2017, 11:05 AM   I saw and evaluated the patient, participating in the key portions of the service and reviewing pertinent diagnostic studies and records.  I reviewed the nurse  practitioner's note and agree with the findings and the plan.  The assessment and plan were discussed with the patient.  A few questions were asked by the patient and answered.   Nolon Stalls, MD 12/16/2017,11:05 AM

## 2017-12-19 ENCOUNTER — Telehealth: Payer: Self-pay | Admitting: Hematology and Oncology

## 2017-12-19 LAB — PROTEIN ELECTROPHORESIS, SERUM
A/G Ratio: 0.9 (ref 0.7–1.7)
Albumin ELP: 3.3 g/dL (ref 2.9–4.4)
Alpha-1-Globulin: 0.2 g/dL (ref 0.0–0.4)
Alpha-2-Globulin: 0.8 g/dL (ref 0.4–1.0)
Beta Globulin: 1.5 g/dL — ABNORMAL HIGH (ref 0.7–1.3)
Gamma Globulin: 1.2 g/dL (ref 0.4–1.8)
Globulin, Total: 3.7 g/dL (ref 2.2–3.9)
Total Protein ELP: 7 g/dL (ref 6.0–8.5)

## 2017-12-19 LAB — KAPPA/LAMBDA LIGHT CHAINS
Kappa free light chain: 46.9 mg/L — ABNORMAL HIGH (ref 3.3–19.4)
Kappa, lambda light chain ratio: 1.52 (ref 0.26–1.65)
Lambda free light chains: 30.9 mg/L — ABNORMAL HIGH (ref 5.7–26.3)

## 2018-01-04 ENCOUNTER — Other Ambulatory Visit: Payer: Self-pay | Admitting: Hematology and Oncology

## 2018-01-04 DIAGNOSIS — C9000 Multiple myeloma not having achieved remission: Secondary | ICD-10-CM

## 2018-01-18 ENCOUNTER — Other Ambulatory Visit: Payer: Self-pay | Admitting: *Deleted

## 2018-01-18 DIAGNOSIS — C9000 Multiple myeloma not having achieved remission: Secondary | ICD-10-CM

## 2018-01-18 MED ORDER — LENALIDOMIDE 2.5 MG PO CAPS: ORAL_CAPSULE | ORAL | 0 refills | Status: DC

## 2018-01-23 ENCOUNTER — Inpatient Hospital Stay: Payer: Medicare Other | Attending: Hematology and Oncology

## 2018-01-23 ENCOUNTER — Telehealth: Payer: Self-pay | Admitting: Hematology and Oncology

## 2018-01-23 ENCOUNTER — Inpatient Hospital Stay: Payer: Medicare Other

## 2018-01-23 ENCOUNTER — Inpatient Hospital Stay (HOSPITAL_BASED_OUTPATIENT_CLINIC_OR_DEPARTMENT_OTHER): Payer: Medicare Other | Admitting: Hematology and Oncology

## 2018-01-23 ENCOUNTER — Encounter: Payer: Self-pay | Admitting: Hematology and Oncology

## 2018-01-23 VITALS — BP 135/77 | HR 66 | Temp 97.3°F | Resp 18 | Ht 67.0 in | Wt 218.5 lb

## 2018-01-23 DIAGNOSIS — I509 Heart failure, unspecified: Secondary | ICD-10-CM

## 2018-01-23 DIAGNOSIS — D125 Benign neoplasm of sigmoid colon: Secondary | ICD-10-CM | POA: Insufficient documentation

## 2018-01-23 DIAGNOSIS — G4730 Sleep apnea, unspecified: Secondary | ICD-10-CM | POA: Insufficient documentation

## 2018-01-23 DIAGNOSIS — R0602 Shortness of breath: Secondary | ICD-10-CM | POA: Insufficient documentation

## 2018-01-23 DIAGNOSIS — M4800 Spinal stenosis, site unspecified: Secondary | ICD-10-CM | POA: Insufficient documentation

## 2018-01-23 DIAGNOSIS — Z9221 Personal history of antineoplastic chemotherapy: Secondary | ICD-10-CM | POA: Diagnosis not present

## 2018-01-23 DIAGNOSIS — I252 Old myocardial infarction: Secondary | ICD-10-CM

## 2018-01-23 DIAGNOSIS — Z8042 Family history of malignant neoplasm of prostate: Secondary | ICD-10-CM | POA: Insufficient documentation

## 2018-01-23 DIAGNOSIS — K219 Gastro-esophageal reflux disease without esophagitis: Secondary | ICD-10-CM

## 2018-01-23 DIAGNOSIS — Z79899 Other long term (current) drug therapy: Secondary | ICD-10-CM | POA: Insufficient documentation

## 2018-01-23 DIAGNOSIS — K5730 Diverticulosis of large intestine without perforation or abscess without bleeding: Secondary | ICD-10-CM

## 2018-01-23 DIAGNOSIS — I25119 Atherosclerotic heart disease of native coronary artery with unspecified angina pectoris: Secondary | ICD-10-CM | POA: Insufficient documentation

## 2018-01-23 DIAGNOSIS — E119 Type 2 diabetes mellitus without complications: Secondary | ICD-10-CM

## 2018-01-23 DIAGNOSIS — Z7189 Other specified counseling: Secondary | ICD-10-CM

## 2018-01-23 DIAGNOSIS — F418 Other specified anxiety disorders: Secondary | ICD-10-CM | POA: Diagnosis not present

## 2018-01-23 DIAGNOSIS — E538 Deficiency of other specified B group vitamins: Secondary | ICD-10-CM | POA: Diagnosis not present

## 2018-01-23 DIAGNOSIS — I10 Essential (primary) hypertension: Secondary | ICD-10-CM | POA: Diagnosis not present

## 2018-01-23 DIAGNOSIS — Z7982 Long term (current) use of aspirin: Secondary | ICD-10-CM | POA: Insufficient documentation

## 2018-01-23 DIAGNOSIS — E785 Hyperlipidemia, unspecified: Secondary | ICD-10-CM | POA: Diagnosis not present

## 2018-01-23 DIAGNOSIS — C9000 Multiple myeloma not having achieved remission: Secondary | ICD-10-CM | POA: Diagnosis present

## 2018-01-23 DIAGNOSIS — Z87891 Personal history of nicotine dependence: Secondary | ICD-10-CM | POA: Insufficient documentation

## 2018-01-23 LAB — CBC WITH DIFFERENTIAL/PLATELET
Basophils Absolute: 0.1 10*3/uL (ref 0–0.1)
Basophils Relative: 3 %
Eosinophils Absolute: 0.2 10*3/uL (ref 0–0.7)
Eosinophils Relative: 6 %
HCT: 36.1 % — ABNORMAL LOW (ref 40.0–52.0)
Hemoglobin: 12.6 g/dL — ABNORMAL LOW (ref 13.0–18.0)
Lymphocytes Relative: 29 %
Lymphs Abs: 0.8 10*3/uL — ABNORMAL LOW (ref 1.0–3.6)
MCH: 36.5 pg — ABNORMAL HIGH (ref 26.0–34.0)
MCHC: 34.9 g/dL (ref 32.0–36.0)
MCV: 104.5 fL — ABNORMAL HIGH (ref 80.0–100.0)
Monocytes Absolute: 0.4 10*3/uL (ref 0.2–1.0)
Monocytes Relative: 14 %
Neutro Abs: 1.4 10*3/uL (ref 1.4–6.5)
Neutrophils Relative %: 48 %
Platelets: 138 10*3/uL — ABNORMAL LOW (ref 150–440)
RBC: 3.45 MIL/uL — ABNORMAL LOW (ref 4.40–5.90)
RDW: 14.8 % — ABNORMAL HIGH (ref 11.5–14.5)
WBC: 2.9 10*3/uL — ABNORMAL LOW (ref 3.8–10.6)

## 2018-01-23 LAB — MAGNESIUM: Magnesium: 1.8 mg/dL (ref 1.7–2.4)

## 2018-01-23 LAB — COMPREHENSIVE METABOLIC PANEL
ALT: 29 U/L (ref 17–63)
AST: 41 U/L (ref 15–41)
Albumin: 3.4 g/dL — ABNORMAL LOW (ref 3.5–5.0)
Alkaline Phosphatase: 76 U/L (ref 38–126)
Anion gap: 10 (ref 5–15)
BUN: 17 mg/dL (ref 6–20)
CO2: 23 mmol/L (ref 22–32)
Calcium: 9.2 mg/dL (ref 8.9–10.3)
Chloride: 106 mmol/L (ref 101–111)
Creatinine, Ser: 0.92 mg/dL (ref 0.61–1.24)
GFR calc Af Amer: >60 mL/min (ref >60)
GFR calc non Af Amer: >60 mL/min (ref >60)
Glucose, Bld: 273 mg/dL — ABNORMAL HIGH (ref 65–99)
Potassium: 4 mmol/L (ref 3.5–5.1)
Sodium: 139 mmol/L (ref 135–145)
Total Bilirubin: 0.5 mg/dL (ref 0.3–1.2)
Total Protein: 7.6 g/dL (ref 6.5–8.1)

## 2018-01-23 MED ORDER — CYANOCOBALAMIN 1000 MCG/ML IJ SOLN
1000.0000 ug | Freq: Once | INTRAMUSCULAR | Status: AC
  Administered 2018-01-23: 1000 ug via INTRAMUSCULAR
  Filled 2018-01-23: qty 1

## 2018-01-23 NOTE — Progress Notes (Signed)
. [facility] day:  01/23/2018   Chief Complaint: Luis [name]. is a 72 y.o. male with multiple myeloma who is seen for 5 week assessment on Revlimid.  HPI:  The patient was last seen in the medical oncology clinic on 12/16/2017.  At that time, he was doing well.  Exam was stable. WBC was 2900 with an [place] of 1100. Hemoglobin was 12.6, hematocrit 36.3, platelets 157,000.  Magnesium was 1.6.  He started his next cycle of Revlimid on 12/23/2017.  During the interim, patient is doing "pretty good". He has allergy symptoms today. Patient has been working in the yard some and his allergy symptoms have increased. Patient denies any bruising or bleeding. He has no B symptoms or recent symptoms. Patient is "sleepy all of the time".  Patient has had a borderline PSG in the past. Plans to speak with PCP about repeat testing.   He continues to take his Revlimid as prescribed. Patient completed his last cycle on 01/12/2018.  Patient is eating well. His weight has increased 1.5 pounds.                                                                                                                                                                                                                       Past Medical History:  Diagnosis Date  . Angina pectoris ([facility])   . Anxiety   . Arthritis   . CHF (congestive heart failure) ([place])   . Coronary artery disease   . Depression   . Diabetes mellitus without complication [facility])    Patient takes Metformin.  . Diverticulosis   . GERD (gastroesophageal reflux disease)   . Hyperlipidemia   . Hypertension   . Multiple myeloma ([place])   . Multiple myeloma ([place]) 03/27/2015  . Myocardial infarction [facility]) April 2001   widowmaker  . Shortness of breath dyspnea   . Sleep apnea    No CPAP @ present  . Spinal stenosis     Past Surgical History:  Procedure Laterality Date  . CARDIAC CATHETERIZATION    . CARPAL TUNNEL RELEASE     . CATARACT EXTRACTION    . COLONOSCOPY WITH PROPOFOL N/A 11/11/2015   Procedure: COLONOSCOPY WITH PROPOFOL;  Surgeon: Lollie Sails, MD;  Location: [facility] ENDOSCOPY;  Service: Endoscopy;  Laterality: N/A;  . CORONARY ARTERY BYPASS GRAFT    . EYE SURGERY Bilateral    Cataract Extraction  . INGUINAL HERNIA REPAIR    .  JOINT REPLACEMENT Right 2008   Right Total Hip Replacement  . PILONIDAL CYST EXCISION    . ROTATOR CUFF REPAIR    . TOTAL HIP ARTHROPLASTY Right   . VENTRAL HERNIA REPAIR N/A 08/15/2015   Procedure: VENTRAL HERNIA REPAIR WITH MESH ;  Surgeon: Leonie Green, MD;  Location: [facility] ORS;  Service: General;  Laterality: N/A;    Family History  Problem Relation Age of Onset  . Heart disease Father   . Stroke Mother   . Prostate cancer Maternal Grandfather 60    Social History:  reports that he quit smoking about 17 years ago. His smoking use included cigarettes. He has a 60.00 pack-year smoking history. He quit smokeless tobacco use about 17 years ago. He reports that he does not drink alcohol or use drugs.  He notes that his wife goes off to work.  He states that his wife would like to retire, but that would affect his medication coverage.  He has projects that he likes to do at home.  He does wood working.  His 42nd high school graduation anniversary was in 06/2016.  He has a new grand-daughter, [name]. The patient is accompanied by his wife, [name], today.  Allergies:  Allergies  Allergen Reactions  . Pravachol [Pravastatin]   . Pravastatin Sodium Other (See Comments)  . Statins Other (See Comments)    Muscle aches  . Zocor [Simvastatin] Other (See Comments)    Muscle aches    Current Medications: Current Outpatient Medications  Medication Sig Dispense Refill  . acetaminophen (TYLENOL) 500 MG tablet Take 500 mg by mouth every 6 (six) hours as needed.    . ALPRAZolam (XANAX) 0.25 MG tablet Take 0.25 mg by mouth at bedtime as needed. for sleep  3  .  amoxicillin (AMOXIL) 500 MG capsule TAKE 4 CAPSULES 1 HOUR PRIOR TO DENTAL APPT.    Marland Kitchen aspirin 81 MG tablet Take 81 mg by mouth daily.     [name] Pollen 500 MG CHEW Chew 1 tablet by mouth daily.    . Calcium Carbonate-Vitamin D 600-400 MG-UNIT chew tablet Chew 4 tablets by mouth daily. Takes them throughout the day for a total of 4    . Cyanocobalamin (VITAMIN B-12 IJ) Inject 1 Dose as directed every 30 (thirty) days.     . ergocalciferol (VITAMIN D2) 50000 UNITS capsule Take 50,000 Units by mouth once a week.    Marland Kitchen KLOR-CON M20 20 MEQ tablet TAKE 1 TABLET (20 MEQ TOTAL) BY MOUTH DAILY. 30 tablet 2  . lenalidomide (REVLIMID) 2.5 MG capsule TAKE 1 CAPSULE BY MOUTH ONCE DAILY FOR 21 DAYS ON THEN 7 DAYS OFF 21 capsule 0  . lisinopril (PRINIVIL,ZESTRIL) 20 MG tablet Take 1 tablet by mouth daily.    Marland Kitchen lovastatin (MEVACOR) 40 MG tablet Take 20 mg by mouth at bedtime. 2 tablets a day    . metFORMIN (GLUCOPHAGE) 500 MG tablet 500 mg daily with breakfast.     . omeprazole (PRILOSEC) 20 MG capsule Take 20 mg by mouth daily.    . traMADol (ULTRAM) 50 MG tablet TAKE 1 TABLET BY MOUTH 3 TIMES A DAY AS NEEDED 60 tablet 0  . Zoledronic Acid (ZOMETA) 4 MG/100ML IVPB Inject into the vein.     No current facility-administered medications for this visit.     Review of Systems:  GENERAL:  Feels "pretty good".  No fevers or sweats.  Weight up 1.5 pounds. PERFORMANCE STATUS (ECOG): 1 HEENT: Allergy symptoms. No  visual changes, sore throat, mouth sores or tenderness. Lungs:  No shortness of breath.  Dry cough. No hemoptysis. Cardiac: No chest pain, palpitations, orthopnea, or PND.  Pre-op echo and stress test good. GI: Eating well.  Constipation.  No nausea, vomiting, diarrhea, melena or hematochezia. GU: No urgency, frequency, dysuria, or hematuria. Musculoskeletal: Spinal stenosis causes legs aches and hip pain.  No joint pain. No muscle tenderness. Extremities: No pain or swelling. Skin: Skin bleeds  easily.  No rashes or skin changes. Neuro: Spinal stenosis s/p injection.  No headache, numbness or weakness, balance or coordination issues. Injection for spinal stenosis. Endocrine: Diabetes.  Elevated blood sugar (see HPI).  No thyroid issues, hot flashes or night sweats. Psych: No mood changes, depression or anxiety. He is a "night owl".  Sleepy. Pain: No focal pain. Review of systems: All other systems reviewed and found to be negative.  Physical Exam: Blood pressure 135/77, pulse 66, temperature (!) 97.3 F (36.3 C), resp. rate 18, height _0  (1.702 m), weight 185 lb (83.9 kg), SpO2 95 %.  GENERAL: Well developed, well nourished, elderly gentleman sitting comfortably in the exam room in no acute distress.  He has a cane at his side. MENTAL STATUS: Alert and oriented to person, place and time. HEAD: Short white hair. Male pattern baldness. Normocephalic, atraumatic, face symmetric, no Cushingoid features. EYES: Blue eyes. Pupils equal round and reactive to light and accomodation. No conjunctivitis or scleral icterus. ENT: Oropharynx clear without lesion. Tongue normal. Mucous membranes moist.  RESPIRATORY: Clear to auscultation without rales, wheezes or rhonchi. CARDIOVASCULAR: Regular rate and rhythm without murmur, rub or gallop. ABDOMEN: Soft, non-tender, with active bowel sounds, and no hepatosplenomegaly. No guarding or rebound tenderness.  No masses. SKIN: No rashes, ulcers or lesions. EXTREMITIES: No edema, no skin discoloration or tenderness. No palpable cords. LYMPH NODES: No palpable cervical, supraclavicular, axillary or inguinal adenopathy  NEUROLOGICAL: Unremarkable. PSYCH: Appropriate   Appointment on 01/23/2018  Component Date Value Ref Range Status  . Magnesium 01/23/2018 1.8  1.7 - 2.4 mg/dL Final   Performed at [facility], [address] [place], [place] [address]  . WBC 01/23/2018 2.9* 3.8 - 10.6 K/uL Final  . RBC 01/23/2018  3.45* 4.40 - 5.90 MIL/uL Final  . Hemoglobin 01/23/2018 12.6* 13.0 - 18.0 g/dL Final  . HCT 01/23/2018 36.1* 40.0 - 52.0 % Final  . MCV 01/23/2018 104.5* 80.0 - 100.0 fL Final  . MCH 01/23/2018 36.5* 26.0 - 34.0 pg Final  . MCHC 01/23/2018 34.9  32.0 - 36.0 g/dL Final  . RDW 01/23/2018 14.8* 11.5 - 14.5 % Final  . Platelets 01/23/2018 138* 150 - 440 K/uL Final  . Neutrophils Relative % 01/23/2018 48  % Final  . Neutro Abs 01/23/2018 1.4  1.4 - 6.5 K/uL Final  . Lymphocytes Relative 01/23/2018 29  % Final  . Lymphs Abs 01/23/2018 0.8* 1.0 - 3.6 K/uL Final  . Monocytes Relative 01/23/2018 14  % Final  . Monocytes Absolute 01/23/2018 0.4  0.2 - 1.0 K/uL Final  . Eosinophils Relative 01/23/2018 6  % Final  . Eosinophils Absolute 01/23/2018 0.2  0 - 0.7 K/uL Final  . Basophils Relative 01/23/2018 3  % Final  . Basophils Absolute 01/23/2018 0.1  0 - 0.1 K/uL Final   Performed at [facility], [address], [place], [place] [address]  . Sodium 01/23/2018 139  135 - 145 mmol/L Final  . Potassium 01/23/2018 4.0  3.5 - 5.1 mmol/L Final  .  Chloride 01/23/2018 106  101 - 111 mmol/L Final  . CO2 01/23/2018 23  22 - 32 mmol/L Final  . Glucose, Bld 01/23/2018 273* 65 - 99 mg/dL Final  . BUN 01/23/2018 17  6 - 20 mg/dL Final  . Creatinine, Ser 01/23/2018 0.92  0.61 - 1.24 mg/dL Final  . Calcium 01/23/2018 9.2  8.9 - 10.3 mg/dL Final  . Total Protein 01/23/2018 7.6  6.5 - 8.1 g/dL Final  . Albumin 01/23/2018 3.4* 3.5 - 5.0 g/dL Final  . AST 01/23/2018 41  15 - 41 U/L Final  . ALT 01/23/2018 29  17 - 63 U/L Final  . Alkaline Phosphatase 01/23/2018 76  38 - 126 U/L Final  . Total Bilirubin 01/23/2018 0.5  0.3 - 1.2 mg/dL Final  . GFR calc non Af Amer 01/23/2018 >60  >60 mL/min Final  . GFR calc Af Amer 01/23/2018 >60  >60 mL/min Final   Comment: (NOTE) The eGFR has been calculated using the CKD EPI equation. This calculation has not been validated in all clinical situations. eGFR's  persistently <60 mL/min signify possible Chronic Kidney Disease.   Georgiann Hahn gap 01/23/2018 10  5 - 15 Final   Performed at [facility], [address] [place], [place] [address]    Assessment:  Luis Tapp. is a 72 y.o. male with stage III multiple myeloma. He presented in 02/2013 with left-sided sharp pain. Evaluation revealed wide-spread lytic lesions including fracture of the left 5th rib laterally. CT scans on 02/22/2013 showed innumerable lytic lesions in thoracic spine, sternum, clavicle, scapula, and ribs.   Bone marrow biopsy on 03/13/2013 revealed 15 to 20% plasma cells. Iron stores were absent. Renal function was normal. SPEP on 03/01/2013 revealed a 1.4 gm/dL IgG monoclonal lambda. IgG was 1987.   He began induction with Revlimid 25 mg a day (3 weeks on and 1 week off) and Decadron (40 mg once a week) on 04/09/2013. SPEP has revealed no monoclonal protein since 07/26/2014 (last check 10/21/2017). IgG was 1362 on 07/26/2014 and 1097 on 12/19/2014.   Lambda free light chains have been monitored: 33.20 (ratio of 1.56) on 09/20/2014, 31.51 (ratio of 1.43) on 12/19/2014, 30.25 (ratio of 1.56) on 05/01/2015, 31.95 (ratio 1.49) on 01/01/2016, 28.02 (ratio 1.29) on 03/25/2016, 31.5 (ratio 1.26) on 05/28/2016, 21.8 (ratio 1.35) on 08/20/2016, 37.3 (ratio 1.09) on 10/22/2016, and 46.3 (ratio 1.49) on 10/21/2017.  24 hour UPEP on 12/02/2015 revealed no monoclonal protein.  With remission, Revlimid was decreased to 10 mg a day then to 10 mg every other day secondary to issues with renal function and diarrhea.  Current Revlimid is 2.5 mg a day (3 weeks on/1 week off).  Last cycle of Revlimid began on 11/19/2017.  Bone survey on 05/30/2015 revealed the majority of small lytic lesions were stable.  There was possibly new lesion in the distal left clavicle and right mid femur (small) and a possible developing lucency in the proximal left femoral shaft.  The calvarial lesion  noted on the prior study was not well seen (? positional).  Bone survey on 11/27/2015 revealed widespread bony lytic lesions consistent with multiple myeloma.  The vast majority of lesions were stable.  A few small lesions were not previously seen.  One lesion was previously obscured by bowel contents.  Two small lesions in the right distal femoral diaphysis appeared new.  Bone survey on 05/25/2016 revealed no evidence of progressive myelomatous involvement of the skeleton.  Bone survey on 05/16/2017 revealed  multiple lucent lesions as previously seen. There was no convincing evidence of progression or superimposed abnormality since prior study.  He received Zometa every 3 months (last 02/28/2017). He takes calcium 4 pills/day. He receives B12 monthly (last 12/09/2017). B12 was 370 on 01/17/2015.  Folate was 17.5 on 09/17/2016.  Abdominal and pelvic CT scan on 07/15/2015 revealed  extensive sigmoid diverticulosis. There was questionable wall thickening of the ascending colon versus artifact from incomplete distention.  Colonoscopy on 11/11/2015 revealed diverticulosis in the sigmoid colon and distal descending colon and ascending colon.  There was a 2 mm polyp in the mid sigmoid colon.  Pathology revealed a hyperplastic polyp, negative for dysplasia or malignancy.  Symptomatically, he is doing well.  Exam is stable. WBC is 2900 with an ANC of 1400. Hemoglobin is 12.6, hematocrit 36.1, platelets 138,000.  Magnesium is 1.8.  Plan: 1.  Labs today:  CBC with diff, CMP, Mg. 2.  B12 today and every 6 weeks.  3.  Start new cycle of Revlimid on 01/27/2018. 4.  RTC on 01/27/2018 for labs (CBC with diff) 5.  RTC on 03/03/2018 for MD assessment, labs (CBC with diff, CMP, Mg, SPEP, FLCA), and B12 injection.    Honor Loh, NP 01/23/2018, 11:39 AM   I saw and evaluated the patient, participating in the key portions of the service and reviewing pertinent diagnostic studies and records.  I reviewed the nurse  practitioner's note and agree with the findings and the plan.  The assessment and plan were discussed with the patient.  A few questions were asked by the patient and answered.   Nolon Stalls, MD 01/23/2018,11:39 AM

## 2018-01-23 NOTE — Progress Notes (Signed)
No new changes noted today 

## 2018-01-23 NOTE — Telephone Encounter (Signed)
Oral Oncology Patient Advocate Encounter  Patient had spoken to [facility] about his medication had not come for this month. I called [facility] and they will call him to set up shipment. He had told them not to ship until after his visit today. So his medication Revlimid was on hold. They will call him today to set up shipment today.   [place] Patient Advocate [phone] 01/23/2018 1:29 PM

## 2018-01-27 ENCOUNTER — Inpatient Hospital Stay: Payer: Medicare Other

## 2018-01-27 DIAGNOSIS — C9000 Multiple myeloma not having achieved remission: Secondary | ICD-10-CM

## 2018-01-27 LAB — CBC WITH DIFFERENTIAL/PLATELET
Basophils Absolute: 0.1 10*3/uL (ref 0–0.1)
Basophils Relative: 3 %
Eosinophils Absolute: 0.1 10*3/uL (ref 0–0.7)
Eosinophils Relative: 4 %
HCT: 36.3 % — ABNORMAL LOW (ref 40.0–52.0)
Hemoglobin: 12.7 g/dL — ABNORMAL LOW (ref 13.0–18.0)
Lymphocytes Relative: 36 %
Lymphs Abs: 1.1 10*3/uL (ref 1.0–3.6)
MCH: 36.3 pg — ABNORMAL HIGH (ref 26.0–34.0)
MCHC: 34.9 g/dL (ref 32.0–36.0)
MCV: 104.1 fL — ABNORMAL HIGH (ref 80.0–100.0)
Monocytes Absolute: 0.3 10*3/uL (ref 0.2–1.0)
Monocytes Relative: 12 %
Neutro Abs: 1.3 10*3/uL — ABNORMAL LOW (ref 1.4–6.5)
Neutrophils Relative %: 45 %
Platelets: 143 10*3/uL — ABNORMAL LOW (ref 150–440)
RBC: 3.49 MIL/uL — ABNORMAL LOW (ref 4.40–5.90)
RDW: 15.1 % — ABNORMAL HIGH (ref 11.5–14.5)
WBC: 2.9 10*3/uL — ABNORMAL LOW (ref 3.8–10.6)

## 2018-01-30 ENCOUNTER — Other Ambulatory Visit: Payer: PRIVATE HEALTH INSURANCE

## 2018-01-30 ENCOUNTER — Ambulatory Visit: Payer: PRIVATE HEALTH INSURANCE

## 2018-01-30 ENCOUNTER — Ambulatory Visit: Payer: PRIVATE HEALTH INSURANCE | Admitting: Hematology and Oncology

## 2018-02-06 DIAGNOSIS — Z96641 Presence of right artificial hip joint: Secondary | ICD-10-CM | POA: Insufficient documentation

## 2018-02-06 DIAGNOSIS — S76019A Strain of muscle, fascia and tendon of unspecified hip, initial encounter: Secondary | ICD-10-CM | POA: Insufficient documentation

## 2018-02-06 DIAGNOSIS — M9973 Connective tissue and disc stenosis of intervertebral foramina of lumbar region: Secondary | ICD-10-CM | POA: Insufficient documentation

## 2018-02-06 DIAGNOSIS — E669 Obesity, unspecified: Secondary | ICD-10-CM | POA: Insufficient documentation

## 2018-02-13 ENCOUNTER — Other Ambulatory Visit: Payer: Self-pay | Admitting: Urgent Care

## 2018-02-13 DIAGNOSIS — C9000 Multiple myeloma not having achieved remission: Secondary | ICD-10-CM

## 2018-02-15 ENCOUNTER — Other Ambulatory Visit: Payer: Self-pay | Admitting: Urgent Care

## 2018-02-15 ENCOUNTER — Telehealth: Payer: Self-pay | Admitting: *Deleted

## 2018-02-15 DIAGNOSIS — C9000 Multiple myeloma not having achieved remission: Secondary | ICD-10-CM

## 2018-02-15 MED ORDER — LENALIDOMIDE 2.5 MG PO CAPS: ORAL_CAPSULE | ORAL | 0 refills | Status: DC

## 2018-02-15 NOTE — Telephone Encounter (Signed)
Pharmacy received prescription with no Auth number on it, Please fax them the [place] number

## 2018-02-15 NOTE — Telephone Encounter (Signed)
New [employer] authorization number obtained ([id number]). New Rx, with [facility] number included, [facility].   Honor Loh, MSN, APRN, FNP-C, CEN Oncology/Hematology Nurse Practitioner  [employer] [employer] 02/15/18, 1:41 PM

## 2018-02-17 ENCOUNTER — Telehealth: Payer: Self-pay | Admitting: Pharmacist

## 2018-02-17 NOTE — Telephone Encounter (Signed)
Oral Chemotherapy Pharmacist Encounter  Successfully enrolled patient for copayment assistance funds from [facility] from the multiple myeloma fund.  Award amount: $10,000 Effective dates: 01/18/18 - 01/18/19 ID: [id number] BIN: [id number] Group: [id number] PCN: [id number]  Billing information will be shared with [facility] as they are filling this patient's Revlimid. I will place a copy of the award letter to be scanned into patient's chart.  Darl Pikes, PharmD, BCPS Hematology/Oncology Clinical Pharmacist [facility] [phone]  02/17/2018 10:34 AM

## 2018-03-03 ENCOUNTER — Inpatient Hospital Stay (HOSPITAL_BASED_OUTPATIENT_CLINIC_OR_DEPARTMENT_OTHER): Payer: Medicare Other | Admitting: Hematology and Oncology

## 2018-03-03 ENCOUNTER — Other Ambulatory Visit: Payer: Self-pay | Admitting: Hematology and Oncology

## 2018-03-03 ENCOUNTER — Inpatient Hospital Stay: Payer: Medicare Other | Attending: Hematology and Oncology

## 2018-03-03 ENCOUNTER — Inpatient Hospital Stay: Payer: Medicare Other

## 2018-03-03 VITALS — BP 128/79 | HR 60 | Temp 97.6°F | Resp 18 | Wt 218.1 lb

## 2018-03-03 DIAGNOSIS — E119 Type 2 diabetes mellitus without complications: Secondary | ICD-10-CM | POA: Diagnosis not present

## 2018-03-03 DIAGNOSIS — R05 Cough: Secondary | ICD-10-CM | POA: Insufficient documentation

## 2018-03-03 DIAGNOSIS — Z79899 Other long term (current) drug therapy: Secondary | ICD-10-CM | POA: Insufficient documentation

## 2018-03-03 DIAGNOSIS — Z7982 Long term (current) use of aspirin: Secondary | ICD-10-CM

## 2018-03-03 DIAGNOSIS — Z7984 Long term (current) use of oral hypoglycemic drugs: Secondary | ICD-10-CM | POA: Insufficient documentation

## 2018-03-03 DIAGNOSIS — I252 Old myocardial infarction: Secondary | ICD-10-CM

## 2018-03-03 DIAGNOSIS — E785 Hyperlipidemia, unspecified: Secondary | ICD-10-CM | POA: Diagnosis not present

## 2018-03-03 DIAGNOSIS — K219 Gastro-esophageal reflux disease without esophagitis: Secondary | ICD-10-CM

## 2018-03-03 DIAGNOSIS — Z87891 Personal history of nicotine dependence: Secondary | ICD-10-CM | POA: Insufficient documentation

## 2018-03-03 DIAGNOSIS — G4730 Sleep apnea, unspecified: Secondary | ICD-10-CM | POA: Diagnosis not present

## 2018-03-03 DIAGNOSIS — F329 Major depressive disorder, single episode, unspecified: Secondary | ICD-10-CM

## 2018-03-03 DIAGNOSIS — I509 Heart failure, unspecified: Secondary | ICD-10-CM

## 2018-03-03 DIAGNOSIS — I10 Essential (primary) hypertension: Secondary | ICD-10-CM

## 2018-03-03 DIAGNOSIS — C9000 Multiple myeloma not having achieved remission: Secondary | ICD-10-CM | POA: Insufficient documentation

## 2018-03-03 DIAGNOSIS — R0602 Shortness of breath: Secondary | ICD-10-CM | POA: Diagnosis not present

## 2018-03-03 DIAGNOSIS — I2510 Atherosclerotic heart disease of native coronary artery without angina pectoris: Secondary | ICD-10-CM | POA: Diagnosis not present

## 2018-03-03 DIAGNOSIS — E538 Deficiency of other specified B group vitamins: Secondary | ICD-10-CM

## 2018-03-03 DIAGNOSIS — F419 Anxiety disorder, unspecified: Secondary | ICD-10-CM | POA: Diagnosis not present

## 2018-03-03 DIAGNOSIS — Z7189 Other specified counseling: Secondary | ICD-10-CM

## 2018-03-03 LAB — CBC WITH DIFFERENTIAL/PLATELET
Basophils Absolute: 0.1 10*3/uL (ref 0–0.1)
Basophils Relative: 3 %
Eosinophils Absolute: 0.1 10*3/uL (ref 0–0.7)
Eosinophils Relative: 5 %
HCT: 36.1 % — ABNORMAL LOW (ref 40.0–52.0)
Hemoglobin: 12.9 g/dL — ABNORMAL LOW (ref 13.0–18.0)
Lymphocytes Relative: 36 %
Lymphs Abs: 1 10*3/uL (ref 1.0–3.6)
MCH: 37 pg — ABNORMAL HIGH (ref 26.0–34.0)
MCHC: 35.7 g/dL (ref 32.0–36.0)
MCV: 103.7 fL — ABNORMAL HIGH (ref 80.0–100.0)
Monocytes Absolute: 0.3 10*3/uL (ref 0.2–1.0)
Monocytes Relative: 12 %
Neutro Abs: 1.3 10*3/uL — ABNORMAL LOW (ref 1.4–6.5)
Neutrophils Relative %: 44 %
Platelets: 137 10*3/uL — ABNORMAL LOW (ref 150–440)
RBC: 3.48 MIL/uL — ABNORMAL LOW (ref 4.40–5.90)
RDW: 13.8 % (ref 11.5–14.5)
WBC: 2.9 10*3/uL — ABNORMAL LOW (ref 3.8–10.6)

## 2018-03-03 LAB — FOLATE: Folate: 33 ng/mL (ref >5.9)

## 2018-03-03 LAB — COMPREHENSIVE METABOLIC PANEL
ALT: 29 U/L (ref 17–63)
AST: 43 U/L — ABNORMAL HIGH (ref 15–41)
Albumin: 3.5 g/dL (ref 3.5–5.0)
Alkaline Phosphatase: 69 U/L (ref 38–126)
Anion gap: 8 (ref 5–15)
BUN: 17 mg/dL (ref 6–20)
CO2: 22 mmol/L (ref 22–32)
Calcium: 9.5 mg/dL (ref 8.9–10.3)
Chloride: 107 mmol/L (ref 101–111)
Creatinine, Ser: 1.07 mg/dL (ref 0.61–1.24)
GFR calc Af Amer: >60 mL/min (ref >60)
GFR calc non Af Amer: >60 mL/min (ref >60)
Glucose, Bld: 132 mg/dL — ABNORMAL HIGH (ref 65–99)
Potassium: 4.3 mmol/L (ref 3.5–5.1)
Sodium: 137 mmol/L (ref 135–145)
Total Bilirubin: 0.5 mg/dL (ref 0.3–1.2)
Total Protein: 7.8 g/dL (ref 6.5–8.1)

## 2018-03-03 LAB — VITAMIN B12: Vitamin B-12: 365 pg/mL (ref 180–914)

## 2018-03-03 LAB — MAGNESIUM: Magnesium: 1.7 mg/dL (ref 1.7–2.4)

## 2018-03-03 MED ORDER — CYANOCOBALAMIN 1000 MCG/ML IJ SOLN
1000.0000 ug | Freq: Once | INTRAMUSCULAR | Status: AC
  Administered 2018-03-03: 1000 ug via INTRAMUSCULAR
  Filled 2018-03-03: qty 1

## 2018-03-03 NOTE — Progress Notes (Signed)
Patient offers no complaints today..  Got Revlimid shipment yesterday.

## 2018-03-03 NOTE — Progress Notes (Signed)
. [facility] day:  03/03/2018   Chief Complaint: Luis Pavlak. is a 72 y.o. male with multiple myeloma who is seen for 6 week assessment on Revlimid.  HPI:  The patient was last seen in the medical oncology clinic on 01/23/2018.  At that time, he was doing well.  Exam was stable. WBC was 2900 with an ANC of 1400. Hemoglobin was 12.6, hematocrit 36.1, platelets 138,000.  Magnesium was 1.8.  CBC on 01/27/2018 revealed a hematocrit of 36.3, hemoglobin 12.7, MCV 104.1, platelets 143,000, WBC 2900 with an ANC of 1300.  During the interim, patient is just getting over a cough. Patient is having increased shortness of breath with exertion. Patient's endurance has decreased. Patient notes, "I get tired really easily". Patient was seen in consult yesterday by cardiology. He has been scheduled for an echocardiogram, nuclear stress test, and carotid dopplers. Patient noted to become winded with standing and untucking his shirt in clinic. Patient denies orthopnea. Patient has "borderline" OSAH determined my a recent overnight oximetry.   Patient denies bruising, bleeding, or areas of palpable adenopathy. He continues on Revlimid as prescribed. His last cycle ended on 02/16/2018.  Patient denies any B symptoms. He is eating well. His weight remains stable. Patient denies pain in the clinic today.                                                                                                                                       Past Medical History:  Diagnosis Date  . Angina pectoris ([place])   . Anxiety   . Arthritis   . CHF (congestive heart failure) ([place])   . Coronary artery disease   . Depression   . Diabetes mellitus without complication [facility])    Patient takes Metformin.  . Diverticulosis   . GERD (gastroesophageal reflux disease)   . Hyperlipidemia   . Hypertension   . Multiple myeloma ([place])   . Multiple myeloma ([place]) 03/27/2015  . Myocardial  infarction [facility]) April 2001   widowmaker  . Shortness of breath dyspnea   . Sleep apnea    No CPAP @ present  . Spinal stenosis     Past Surgical History:  Procedure Laterality Date  . CARDIAC CATHETERIZATION    . CARPAL TUNNEL RELEASE    . CATARACT EXTRACTION    . COLONOSCOPY WITH PROPOFOL N/A 11/11/2015   Procedure: COLONOSCOPY WITH PROPOFOL;  Surgeon: Lollie Sails, MD;  Location: [facility] ENDOSCOPY;  Service: Endoscopy;  Laterality: N/A;  . CORONARY ARTERY BYPASS GRAFT    . EYE SURGERY Bilateral    Cataract Extraction  . INGUINAL HERNIA REPAIR    . JOINT REPLACEMENT Right 2008   Right Total Hip Replacement  . PILONIDAL CYST EXCISION    . ROTATOR CUFF REPAIR    . TOTAL HIP ARTHROPLASTY Right   . VENTRAL HERNIA REPAIR N/A  08/15/2015   Procedure: VENTRAL HERNIA REPAIR WITH MESH ;  Surgeon: Leonie Green, MD;  Location: [facility] ORS;  Service: General;  Laterality: N/A;    Family History  Problem Relation Age of Onset  . Heart disease Father   . Stroke Mother   . Prostate cancer Maternal Grandfather 72    Social History:  reports that he quit smoking about 18 years ago. His smoking use included cigarettes. He has a 60.00 pack-year smoking history. He quit smokeless tobacco use about 18 years ago. He reports that he does not drink alcohol or use drugs.  He notes that his wife goes off to work.  He states that his wife would like to retire, but that would affect his medication coverage.  He has projects that he likes to do at home.  He does wood working.  His 42nd high school graduation anniversary was in 06/2016.  He has a new grand-daughter, [name]. The patient is accompanied by his wife, Luis [name], today.  Allergies:  Allergies  Allergen Reactions  . Pravachol [Pravastatin]   . Pravastatin Sodium Other (See Comments)  . Statins Other (See Comments)    Muscle aches  . Zocor [Simvastatin] Other (See Comments)    Muscle aches    Current Medications: Current Outpatient  Medications  Medication Sig Dispense Refill  . acetaminophen (TYLENOL) 500 MG tablet Take 500 mg by mouth every 6 (six) hours as needed.    . ALPRAZolam (XANAX) 0.25 MG tablet Take 0.25 mg by mouth at bedtime as needed. for sleep  3  . amoxicillin (AMOXIL) 500 MG capsule TAKE 4 CAPSULES 1 HOUR PRIOR TO DENTAL APPT.    Marland Kitchen aspirin 81 MG tablet Take 81 mg by mouth daily.     [name] Pollen 500 MG CHEW Chew 1 tablet by mouth daily.    . Calcium Carbonate-Vitamin D 600-400 MG-UNIT chew tablet Chew 4 tablets by mouth daily. Takes them throughout the day for a total of 4    . Cyanocobalamin (VITAMIN B-12 IJ) Inject 1 Dose as directed every 30 (thirty) days.     . ergocalciferol (VITAMIN D2) 50000 UNITS capsule Take 50,000 Units by mouth once a week.    . lenalidomide (REVLIMID) 2.5 MG capsule TAKE 1 CAPSULE BY MOUTH ONCE DAILY FOR 21 DAYS ON, THEN 7 DAYS OFF 21 capsule 0  . lisinopril (PRINIVIL,ZESTRIL) 20 MG tablet Take 1 tablet by mouth daily.    Marland Kitchen lovastatin (MEVACOR) 40 MG tablet Take 20 mg by mouth at bedtime. 2 tablets a day    . metFORMIN (GLUCOPHAGE) 500 MG tablet 500 mg daily with breakfast.     . omeprazole (PRILOSEC) 20 MG capsule Take 20 mg by mouth daily.    . traMADol (ULTRAM) 50 MG tablet TAKE 1 TABLET BY MOUTH 3 TIMES A DAY AS NEEDED 60 tablet 0  . Zoledronic Acid (ZOMETA) 4 MG/100ML IVPB Inject into the vein.    Marland Kitchen KLOR-CON M20 20 MEQ tablet TAKE 1 TABLET (20 MEQ TOTAL) BY MOUTH DAILY. (Patient not taking: Reported on 03/03/2018) 30 tablet 2   No current facility-administered medications for this visit.     Review of Systems:  GENERAL:  Gets tired easily.  No fevers, sweats or weight loss.  Weight stable. PERFORMANCE STATUS (ECOG):  1 HEENT:  Allergy symptoms (see HPI).  No visual changes, runny nose, sore throat, mouth sores or tenderness. Lungs: Shortness of breath with exertion.  Cough, resolved.  No hemoptysis.  "Borderline sleep apnea".  Cardiac:  No chest pain, palpitations,  orthopnea, or PND.  Cardiology work-up in near future. GI:  No nausea, vomiting, diarrhea, constipation, melena or hematochezia. GU:  No urgency, frequency, dysuria, or hematuria. Musculoskeletal:  Spinal stenosis.  No back pain.  No joint pain.  No muscle tenderness. Extremities:  No pain or swelling. Skin:  Bruises easily.  No rashes or skin changes. Neuro:  No headache, numbness or weakness, balance or coordination issues. Endocrine:  Diabetes.  No thyroid issues, hot flashes or night sweats. Psych:  No mood changes, depression or anxiety.  Poor sleep. Pain:  No focal pain. Review of systems:  All other systems reviewed and found to be negative.   Physical Exam: Blood pressure 128/79, pulse 60, temperature 97.6 F (36.4 C), temperature source Tympanic, resp. rate 18, weight 218 lb 2 oz (98.9 kg).  GENERAL:  Well developed, well nourished, gentleman sitting comfortably in the exam room in no acute distress.  He has a cane at his side. MENTAL STATUS:  Alert and oriented to person, place and time. HEAD:  Short white hair.  Male pattern baldness.  Normocephalic, atraumatic, face symmetric, no Cushingoid features. EYES:  Blue eyes.  Pupils equal round and reactive to light and accomodation.  No conjunctivitis or scleral icterus. ENT:  Oropharynx clear without lesion.  Tongue normal. Mucous membranes moist.  RESPIRATORY:  Clear to auscultation without rales, wheezes or rhonchi. CARDIOVASCULAR:  Regular rate and rhythm without murmur, rub or gallop. ABDOMEN:  Soft, non-tender, with active bowel sounds, and no hepatosplenomegaly.  No masses. SKIN:  No rashes, ulcers or lesions. EXTREMITIES: No edema, no skin discoloration or tenderness.  No palpable cords. LYMPH NODES: No palpable cervical, supraclavicular, axillary or inguinal adenopathy  NEUROLOGICAL: Unremarkable. PSYCH:  Appropriate.    Appointment on 03/03/2018  Component Date Value Ref Range Status  . Magnesium 03/03/2018 1.7  1.7 -  2.4 mg/dL Final   Performed at [facility], [address] [place], [place] [address]  . Sodium 03/03/2018 137  135 - 145 mmol/L Final  . Potassium 03/03/2018 4.3  3.5 - 5.1 mmol/L Final  . Chloride 03/03/2018 107  101 - 111 mmol/L Final  . CO2 03/03/2018 22  22 - 32 mmol/L Final  . Glucose, Bld 03/03/2018 132* 65 - 99 mg/dL Final  . BUN 03/03/2018 17  6 - 20 mg/dL Final  . Creatinine, Ser 03/03/2018 1.07  0.61 - 1.24 mg/dL Final  . Calcium 03/03/2018 9.5  8.9 - 10.3 mg/dL Final  . Total Protein 03/03/2018 7.8  6.5 - 8.1 g/dL Final  . Albumin 03/03/2018 3.5  3.5 - 5.0 g/dL Final  . AST 03/03/2018 43* 15 - 41 U/L Final  . ALT 03/03/2018 29  17 - 63 U/L Final  . Alkaline Phosphatase 03/03/2018 69  38 - 126 U/L Final  . Total Bilirubin 03/03/2018 0.5  0.3 - 1.2 mg/dL Final  . GFR calc non Af Amer 03/03/2018 >60  >60 mL/min Final  . GFR calc Af Amer 03/03/2018 >60  >60 mL/min Final   Comment: (NOTE) The eGFR has been calculated using the CKD EPI equation. This calculation has not been validated in all clinical situations. eGFR's persistently <60 mL/min signify possible Chronic Kidney Disease.   Georgiann Hahn gap 03/03/2018 8  5 - 15 Final   Performed at [facility], [address] [place], [place] [address]  . WBC 03/03/2018 2.9* 3.8 - 10.6 K/uL Final  . RBC 03/03/2018 3.48* 4.40 - 5.90  MIL/uL Final  . Hemoglobin 03/03/2018 12.9* 13.0 - 18.0 g/dL Final  . HCT 03/03/2018 36.1* 40.0 - 52.0 % Final  . MCV 03/03/2018 103.7* 80.0 - 100.0 fL Final  . MCH 03/03/2018 37.0* 26.0 - 34.0 pg Final  . MCHC 03/03/2018 35.7  32.0 - 36.0 g/dL Final  . RDW 03/03/2018 13.8  11.5 - 14.5 % Final  . Platelets 03/03/2018 137* 150 - 440 K/uL Final  . Neutrophils Relative % 03/03/2018 44  % Final  . Neutro Abs 03/03/2018 1.3* 1.4 - 6.5 K/uL Final  . Lymphocytes Relative 03/03/2018 36  % Final  . Lymphs Abs 03/03/2018 1.0  1.0 - 3.6 K/uL Final  . Monocytes Relative 03/03/2018 12  % Final  .  Monocytes Absolute 03/03/2018 0.3  0.2 - 1.0 K/uL Final  . Eosinophils Relative 03/03/2018 5  % Final  . Eosinophils Absolute 03/03/2018 0.1  0 - 0.7 K/uL Final  . Basophils Relative 03/03/2018 3  % Final  . Basophils Absolute 03/03/2018 0.1  0 - 0.1 K/uL Final   Performed at [facility], [address], [place], [place] [address]    Assessment:  Luis Dieudonne. is a 72 y.o. male with stage III multiple myeloma. He presented in 02/2013 with left-sided sharp pain. Evaluation revealed wide-spread lytic lesions including fracture of the left 5th rib laterally. CT scans on 02/22/2013 showed innumerable lytic lesions in thoracic spine, sternum, clavicle, scapula, and ribs.   Bone marrow biopsy on 03/13/2013 revealed 15 to 20% plasma cells. Iron stores were absent. Renal function was normal. SPEP on 03/01/2013 revealed a 1.4 gm/dL IgG monoclonal lambda. IgG was 1987.   He began induction with Revlimid 25 mg a day (3 weeks on and 1 week off) and Decadron (40 mg once a week) on 04/09/2013. SPEP has revealed no monoclonal protein since 07/26/2014 (last check 12/16/2017). IgG was 1362 on 07/26/2014 and 1097 on 12/19/2014.   Lambda free light chains have been monitored: 33.20 (ratio of 1.56) on 09/20/2014, 31.51 (ratio of 1.43) on 12/19/2014, 30.25 (ratio of 1.56) on 05/01/2015, 31.95 (ratio 1.49) on 01/01/2016, 28.02 (ratio 1.29) on 03/25/2016, 31.5 (ratio 1.26) on 05/28/2016, 21.8 (ratio 1.35) on 08/20/2016, 37.3 (ratio 1.09) on 10/22/2016, and 46.3 (ratio 1.49) on 10/21/2017.  24 hour UPEP on 12/02/2015 revealed no monoclonal protein.  With remission, Revlimid was decreased to 10 mg a day then to 10 mg every other day secondary to issues with renal function and diarrhea.  Current Revlimid is 2.5 mg a day (3 weeks on/1 week off).  Last cycle of Revlimid began on 01/27/2018.  Bone survey on 05/30/2015 revealed the majority of small lytic lesions were stable.  There was possibly new  lesion in the distal left clavicle and right mid femur (small) and a possible developing lucency in the proximal left femoral shaft.  The calvarial lesion noted on the prior study was not well seen (? positional).  Bone survey on 11/27/2015 revealed widespread bony lytic lesions consistent with multiple myeloma.  The vast majority of lesions were stable.  A few small lesions were not previously seen.  One lesion was previously obscured by bowel contents.  Two small lesions in the right distal femoral diaphysis appeared new.  Bone survey on 05/25/2016 revealed no evidence of progressive myelomatous involvement of the skeleton.  Bone survey on 05/16/2017 revealed multiple lucent lesions as previously seen. There was no convincing evidence of progression or superimposed abnormality since prior study.  He received Zometa every  3 months (last 02/28/2017). He takes calcium 4 pills/day. He receives B12 every 6 weeks (last 01/23/2018). B12 was 370 on 01/17/2015.  Folate was 17.5 on 09/17/2016.  Abdominal and pelvic CT scan on 07/15/2015 revealed  extensive sigmoid diverticulosis. There was questionable wall thickening of the ascending colon versus artifact from incomplete distention.  Colonoscopy on 11/11/2015 revealed diverticulosis in the sigmoid colon and distal descending colon and ascending colon.  There was a 2 mm polyp in the mid sigmoid colon.  Pathology revealed a hyperplastic polyp, negative for dysplasia or malignancy.  Symptomatically, he is fatigued.  He has shortness of breath on exertion. He has been seen in consult by cardiology, with plans to for further testing in the near future.  Exam is stable. WBC is 900 with an ANC of 1300.  Plan: 1.  Labs today:  CBC with diff, CMP, Mg, SPEP, B12, folate. 2.  B12 today and every 6 weeks.  3.  Start new cycle of Revlimid on 03/03/2018. 4.  RTC in 3 weeks for labs (CBC with diff). 5.  RTC in 6 weeks for MD assessment, labs (CBC with diff, CMP, Mg,  FLCA), and B12 injection.    Honor Loh, NP 03/03/2018, 3:51 PM   I saw and evaluated the patient, participating in the key portions of the service and reviewing pertinent diagnostic studies and records.  I reviewed the nurse practitioner's note and agree with the findings and the plan.  The assessment and plan were discussed with the patient.  A few questions were asked by the patient and answered.   Nolon Stalls, MD 03/03/2018,3:51 PM

## 2018-03-04 ENCOUNTER — Encounter: Payer: Self-pay | Admitting: Hematology and Oncology

## 2018-03-06 LAB — PROTEIN ELECTROPHORESIS, SERUM
A/G Ratio: 0.9 (ref 0.7–1.7)
Albumin ELP: 3.4 g/dL (ref 2.9–4.4)
Alpha-1-Globulin: 0.2 g/dL (ref 0.0–0.4)
Alpha-2-Globulin: 0.7 g/dL (ref 0.4–1.0)
Beta Globulin: 1.6 g/dL — ABNORMAL HIGH (ref 0.7–1.3)
Gamma Globulin: 1.3 g/dL (ref 0.4–1.8)
Globulin, Total: 3.9 g/dL (ref 2.2–3.9)
Total Protein ELP: 7.3 g/dL (ref 6.0–8.5)

## 2018-03-20 ENCOUNTER — Other Ambulatory Visit: Payer: Self-pay | Admitting: Urgent Care

## 2018-03-20 DIAGNOSIS — C9000 Multiple myeloma not having achieved remission: Secondary | ICD-10-CM

## 2018-03-21 DIAGNOSIS — I6523 Occlusion and stenosis of bilateral carotid arteries: Secondary | ICD-10-CM | POA: Insufficient documentation

## 2018-03-24 ENCOUNTER — Other Ambulatory Visit: Payer: PRIVATE HEALTH INSURANCE

## 2018-03-24 ENCOUNTER — Inpatient Hospital Stay: Payer: Medicare Other | Attending: Hematology and Oncology

## 2018-03-24 DIAGNOSIS — C9000 Multiple myeloma not having achieved remission: Secondary | ICD-10-CM | POA: Diagnosis not present

## 2018-03-24 LAB — CBC WITH DIFFERENTIAL/PLATELET
Basophils Absolute: 0.1 10*3/uL (ref 0–0.1)
Basophils Relative: 2 %
Eosinophils Absolute: 0.3 10*3/uL (ref 0–0.7)
Eosinophils Relative: 10 %
HCT: 37.5 % — ABNORMAL LOW (ref 40.0–52.0)
Hemoglobin: 13.3 g/dL (ref 13.0–18.0)
Lymphocytes Relative: 33 %
Lymphs Abs: 0.9 10*3/uL — ABNORMAL LOW (ref 1.0–3.6)
MCH: 37.1 pg — ABNORMAL HIGH (ref 26.0–34.0)
MCHC: 35.4 g/dL (ref 32.0–36.0)
MCV: 104.6 fL — ABNORMAL HIGH (ref 80.0–100.0)
Monocytes Absolute: 0.4 10*3/uL (ref 0.2–1.0)
Monocytes Relative: 14 %
Neutro Abs: 1.1 10*3/uL — ABNORMAL LOW (ref 1.4–6.5)
Neutrophils Relative %: 41 %
Platelets: 128 10*3/uL — ABNORMAL LOW (ref 150–440)
RBC: 3.58 MIL/uL — ABNORMAL LOW (ref 4.40–5.90)
RDW: 13.8 % (ref 11.5–14.5)
WBC: 2.8 10*3/uL — ABNORMAL LOW (ref 3.8–10.6)

## 2018-04-14 ENCOUNTER — Inpatient Hospital Stay (HOSPITAL_BASED_OUTPATIENT_CLINIC_OR_DEPARTMENT_OTHER): Payer: Medicare Other | Admitting: Hematology and Oncology

## 2018-04-14 ENCOUNTER — Ambulatory Visit: Payer: PRIVATE HEALTH INSURANCE

## 2018-04-14 ENCOUNTER — Inpatient Hospital Stay: Payer: Medicare Other | Attending: Hematology and Oncology

## 2018-04-14 ENCOUNTER — Other Ambulatory Visit: Payer: Self-pay

## 2018-04-14 ENCOUNTER — Encounter: Payer: Self-pay | Admitting: Hematology and Oncology

## 2018-04-14 ENCOUNTER — Inpatient Hospital Stay: Payer: Medicare Other

## 2018-04-14 VITALS — BP 123/70 | HR 62 | Temp 97.3°F | Resp 18 | Ht 67.0 in | Wt 219.5 lb

## 2018-04-14 DIAGNOSIS — C9000 Multiple myeloma not having achieved remission: Secondary | ICD-10-CM | POA: Diagnosis present

## 2018-04-14 DIAGNOSIS — I252 Old myocardial infarction: Secondary | ICD-10-CM | POA: Diagnosis not present

## 2018-04-14 DIAGNOSIS — Z7982 Long term (current) use of aspirin: Secondary | ICD-10-CM | POA: Insufficient documentation

## 2018-04-14 DIAGNOSIS — K219 Gastro-esophageal reflux disease without esophagitis: Secondary | ICD-10-CM | POA: Insufficient documentation

## 2018-04-14 DIAGNOSIS — F418 Other specified anxiety disorders: Secondary | ICD-10-CM

## 2018-04-14 DIAGNOSIS — Z79899 Other long term (current) drug therapy: Secondary | ICD-10-CM

## 2018-04-14 DIAGNOSIS — I209 Angina pectoris, unspecified: Secondary | ICD-10-CM | POA: Diagnosis not present

## 2018-04-14 DIAGNOSIS — I10 Essential (primary) hypertension: Secondary | ICD-10-CM

## 2018-04-14 DIAGNOSIS — Z87891 Personal history of nicotine dependence: Secondary | ICD-10-CM

## 2018-04-14 DIAGNOSIS — Z7984 Long term (current) use of oral hypoglycemic drugs: Secondary | ICD-10-CM | POA: Insufficient documentation

## 2018-04-14 DIAGNOSIS — G4730 Sleep apnea, unspecified: Secondary | ICD-10-CM

## 2018-04-14 DIAGNOSIS — E785 Hyperlipidemia, unspecified: Secondary | ICD-10-CM

## 2018-04-14 DIAGNOSIS — I2510 Atherosclerotic heart disease of native coronary artery without angina pectoris: Secondary | ICD-10-CM

## 2018-04-14 DIAGNOSIS — D125 Benign neoplasm of sigmoid colon: Secondary | ICD-10-CM | POA: Insufficient documentation

## 2018-04-14 DIAGNOSIS — Z8042 Family history of malignant neoplasm of prostate: Secondary | ICD-10-CM | POA: Insufficient documentation

## 2018-04-14 DIAGNOSIS — R0602 Shortness of breath: Secondary | ICD-10-CM | POA: Diagnosis not present

## 2018-04-14 DIAGNOSIS — E538 Deficiency of other specified B group vitamins: Secondary | ICD-10-CM

## 2018-04-14 DIAGNOSIS — K5730 Diverticulosis of large intestine without perforation or abscess without bleeding: Secondary | ICD-10-CM

## 2018-04-14 DIAGNOSIS — M4800 Spinal stenosis, site unspecified: Secondary | ICD-10-CM | POA: Diagnosis not present

## 2018-04-14 DIAGNOSIS — E119 Type 2 diabetes mellitus without complications: Secondary | ICD-10-CM | POA: Insufficient documentation

## 2018-04-14 LAB — CBC WITH DIFFERENTIAL/PLATELET
Basophils Absolute: 0.1 10*3/uL (ref 0–0.1)
Basophils Relative: 2 %
Eosinophils Absolute: 0.1 10*3/uL (ref 0–0.7)
Eosinophils Relative: 4 %
HCT: 36.9 % — ABNORMAL LOW (ref 40.0–52.0)
Hemoglobin: 13.1 g/dL (ref 13.0–18.0)
Lymphocytes Relative: 26 %
Lymphs Abs: 0.8 10*3/uL — ABNORMAL LOW (ref 1.0–3.6)
MCH: 37.2 pg — ABNORMAL HIGH (ref 26.0–34.0)
MCHC: 35.5 g/dL (ref 32.0–36.0)
MCV: 104.8 fL — ABNORMAL HIGH (ref 80.0–100.0)
Monocytes Absolute: 0.3 10*3/uL (ref 0.2–1.0)
Monocytes Relative: 10 %
Neutro Abs: 1.9 10*3/uL (ref 1.4–6.5)
Neutrophils Relative %: 58 %
Platelets: 140 10*3/uL — ABNORMAL LOW (ref 150–440)
RBC: 3.52 MIL/uL — ABNORMAL LOW (ref 4.40–5.90)
RDW: 13.3 % (ref 11.5–14.5)
WBC: 3.2 10*3/uL — ABNORMAL LOW (ref 3.8–10.6)

## 2018-04-14 LAB — COMPREHENSIVE METABOLIC PANEL
ALT: 37 U/L (ref 17–63)
AST: 52 U/L — ABNORMAL HIGH (ref 15–41)
Albumin: 3.5 g/dL (ref 3.5–5.0)
Alkaline Phosphatase: 88 U/L (ref 38–126)
Anion gap: 9 (ref 5–15)
BUN: 19 mg/dL (ref 6–20)
CO2: 21 mmol/L — ABNORMAL LOW (ref 22–32)
Calcium: 9.9 mg/dL (ref 8.9–10.3)
Chloride: 103 mmol/L (ref 101–111)
Creatinine, Ser: 0.94 mg/dL (ref 0.61–1.24)
GFR calc Af Amer: >60 mL/min (ref >60)
GFR calc non Af Amer: >60 mL/min (ref >60)
Glucose, Bld: 313 mg/dL — ABNORMAL HIGH (ref 65–99)
Potassium: 4.5 mmol/L (ref 3.5–5.1)
Sodium: 133 mmol/L — ABNORMAL LOW (ref 135–145)
Total Bilirubin: 0.9 mg/dL (ref 0.3–1.2)
Total Protein: 7.8 g/dL (ref 6.5–8.1)

## 2018-04-14 LAB — MAGNESIUM: Magnesium: 1.8 mg/dL (ref 1.7–2.4)

## 2018-04-14 MED ORDER — CYANOCOBALAMIN 1000 MCG/ML IJ SOLN
1000.0000 ug | Freq: Once | INTRAMUSCULAR | Status: AC
  Administered 2018-04-14: 1000 ug via INTRAMUSCULAR
  Filled 2018-04-14: qty 1

## 2018-04-14 NOTE — Progress Notes (Signed)
No new changes noted today 

## 2018-04-14 NOTE — Progress Notes (Signed)
. [facility] day:  04/14/2018   Chief Complaint: Luis Spray. is a 72 y.o. male with multiple myeloma who is seen for 6 week assessment on Revlimid.  HPI:  The patient was last seen in the medical oncology clinic on 03/03/2018.  At that time, he was fatigued.  He had shortness of breath on exertion. He had been seen in consult by cardiology, with plans to for further testing.  Exam was stable. WBC is 2900 with an ANC of 1300.  M-spike was 0.  CBC on 03/24/2018 revealed a hematocrit of 37.5, hemoglobin 13.3, MCV 104.6, platelets 128,000, WBC 2800 with an ANC of 1100.  During the interim, he is felt pretty good.  He notes every 3 days loose stools.  He denies any infections or bone pain.  He has some shortness of breath on exertion.  He is monitoring his blood sugar.  At times, he has problems sleeping.                                                                                                                                      Past Medical History:  Diagnosis Date  . Angina pectoris ([place])   . Anxiety   . Arthritis   . CHF (congestive heart failure) ([place])   . Coronary artery disease   . Depression   . Diabetes mellitus without complication [facility])    Patient takes Metformin.  . Diverticulosis   . GERD (gastroesophageal reflux disease)   . Hyperlipidemia   . Hypertension   . Multiple myeloma ([place])   . Multiple myeloma ([place]) 03/27/2015  . Myocardial infarction [facility]) April 2001   widowmaker  . Shortness of breath dyspnea   . Sleep apnea    No CPAP @ present  . Spinal stenosis     Past Surgical History:  Procedure Laterality Date  . CARDIAC CATHETERIZATION    . CARPAL TUNNEL RELEASE    . CATARACT EXTRACTION    . COLONOSCOPY WITH PROPOFOL N/A 11/11/2015   Procedure: COLONOSCOPY WITH PROPOFOL;  Surgeon: Lollie Sails, MD;  Location: [facility] ENDOSCOPY;  Service: Endoscopy;  Laterality: N/A;  . CORONARY ARTERY BYPASS GRAFT    . EYE  SURGERY Bilateral    Cataract Extraction  . INGUINAL HERNIA REPAIR    . JOINT REPLACEMENT Right 2008   Right Total Hip Replacement  . PILONIDAL CYST EXCISION    . ROTATOR CUFF REPAIR    . TOTAL HIP ARTHROPLASTY Right   . VENTRAL HERNIA REPAIR N/A 08/15/2015   Procedure: VENTRAL HERNIA REPAIR WITH MESH ;  Surgeon: Leonie Green, MD;  Location: [facility] ORS;  Service: General;  Laterality: N/A;    Family History  Problem Relation Age of Onset  . Heart disease Father   . Stroke Mother   . Prostate cancer Maternal Grandfather 62    Social History:  reports that he quit smoking about 18 years ago. His smoking use included cigarettes. He has a 60.00 pack-year smoking history. He quit smokeless tobacco use about 18 years ago. He reports that he does not drink alcohol or use drugs.  He notes that his wife goes off to work.  He states that his wife would like to retire, but that would affect his medication coverage.  He has projects that he likes to do at home.  He does wood working.  His 42nd high school graduation anniversary was in 06/2016.  He has a new grand-daughter, [name].  His 50th anniversary is coming up.  The patient is accompanied by his wife, [name], today.  Allergies:  Allergies  Allergen Reactions  . Pravachol [Pravastatin]   . Pravastatin Sodium Other (See Comments)  . Statins Other (See Comments)    Muscle aches  . Zocor [Simvastatin] Other (See Comments)    Muscle aches    Current Medications: Current Outpatient Medications  Medication Sig Dispense Refill  . ALPRAZolam (XANAX) 0.25 MG tablet Take 0.25 mg by mouth at bedtime as needed. for sleep  3  . aspirin 81 MG tablet Take 81 mg by mouth daily.     [name] Pollen 500 MG CHEW Chew 1 tablet by mouth daily.    . Calcium Carbonate-Vitamin D 600-400 MG-UNIT chew tablet Chew 4 tablets by mouth daily. Takes them throughout the day for a total of 4    . Cyanocobalamin (VITAMIN B-12 IJ) Inject 1 Dose as directed every 30  (thirty) days.     . ergocalciferol (VITAMIN D2) 50000 UNITS capsule Take 50,000 Units by mouth once a week.    . lovastatin (MEVACOR) 40 MG tablet Take 20 mg by mouth at bedtime. 2 tablets a day    . metFORMIN (GLUCOPHAGE) 500 MG tablet 500 mg 2 (two) times daily with a meal.     . omeprazole (PRILOSEC) 20 MG capsule Take 20 mg by mouth daily.    Marland Kitchen REVLIMID 2.5 MG capsule TAKE 1 CAPSULE BY MOUTH ONCE DAILY FOR 21 DAYS ON, THEN 7 DAYS OFF 21 capsule 0  . acetaminophen (TYLENOL) 500 MG tablet Take 500 mg by mouth every 6 (six) hours as needed.    Marland Kitchen amoxicillin (AMOXIL) 500 MG capsule TAKE 4 CAPSULES 1 HOUR PRIOR TO DENTAL APPT.    Marland Kitchen KLOR-CON M20 20 MEQ tablet TAKE 1 TABLET (20 MEQ TOTAL) BY MOUTH DAILY. (Patient not taking: Reported on 03/03/2018) 30 tablet 2  . lisinopril (PRINIVIL,ZESTRIL) 20 MG tablet Take 1 tablet by mouth daily.    . traMADol (ULTRAM) 50 MG tablet TAKE 1 TABLET BY MOUTH 3 TIMES A DAY AS NEEDED (Patient not taking: Reported on 04/14/2018) 60 tablet 0  . Zoledronic Acid (ZOMETA) 4 MG/100ML IVPB Inject into the vein.     No current facility-administered medications for this visit.     Review of Systems  Constitutional: Negative for chills, diaphoresis, fever, malaise/fatigue and weight loss.       Feels pretty good.  HENT: Negative.  Negative for ear discharge, ear pain, sinus pain, sore throat and tinnitus.   Eyes: Negative.  Negative for blurred vision, double vision, photophobia, pain and discharge.  Respiratory: Positive for shortness of breath (exertional). Negative for cough, hemoptysis and sputum production.        "borderline" OSAH syndrome  Cardiovascular: Negative.  Negative for chest pain, palpitations, orthopnea, leg swelling and PND.  Gastrointestinal: Positive for diarrhea (intermittent- every 3 days).  Negative for abdominal pain, blood in stool, constipation, melena, nausea and vomiting.  Genitourinary: Negative.  Negative for dysuria, frequency, hematuria and  urgency.  Musculoskeletal: Positive for back pain (known spinal stenosis). Negative for falls, joint pain, myalgias and neck pain.  Skin: Negative.  Negative for itching and rash.  Neurological: Negative for dizziness, tremors, weakness and headaches.  Endo/Heme/Allergies: Bruises/bleeds easily.       Diabetes (monitoring blood sugar)  Psychiatric/Behavioral: Negative for depression and suicidal ideas. The patient has insomnia (at times). The patient is not nervous/anxious.   All other systems reviewed and are negative.  Performance status (ECOG): 1 - Symptomatic but completely ambulatory   Physical Exam: Blood pressure 123/70, pulse 62, temperature (!) 97.3 F (36.3 C), temperature source Tympanic, resp. rate 18, height _0  (1.702 m), weight 219 lb 8 oz (99.6 kg), SpO2 95 %.  GENERAL:  Well developed, well nourished, gentleman sitting comfortably in the exam room in no acute distress.  He has a cane at his side. MENTAL STATUS:  Alert and oriented to person, place and time. HEAD:  Short white hair.  Male pattern baldness.  Normocephalic, atraumatic, face symmetric, no Cushingoid features. EYES:  Blue eyes.  Pupils equal round and reactive to light and accomodation.  No conjunctivitis or scleral icterus. ENT:  Oropharynx clear without lesion.  Tongue normal. Mucous membranes moist.  RESPIRATORY:  Clear to auscultation without rales, wheezes or rhonchi. CARDIOVASCULAR:  Regular rate and rhythm without murmur, rub or gallop. ABDOMEN:  Soft, non-tender, with active bowel sounds, and no hepatosplenomegaly.  No masses. SKIN:  No rashes, ulcers or lesions. EXTREMITIES: No edema, no skin discoloration or tenderness.  No palpable cords. LYMPH NODES: No palpable cervical, supraclavicular, axillary or inguinal adenopathy  NEUROLOGICAL: Unremarkable. PSYCH:  Appropriate.    Orders Only on 04/14/2018  Component Date Value Ref Range Status  . Magnesium 04/14/2018 1.8  1.7 - 2.4 mg/dL Final    Performed at [facility], [address], [place], [place] [address]  . Sodium 04/14/2018 133* 135 - 145 mmol/L Final  . Potassium 04/14/2018 4.5  3.5 - 5.1 mmol/L Final  . Chloride 04/14/2018 103  101 - 111 mmol/L Final  . CO2 04/14/2018 21* 22 - 32 mmol/L Final  . Glucose, Bld 04/14/2018 313* 65 - 99 mg/dL Final  . BUN 04/14/2018 19  6 - 20 mg/dL Final  . Creatinine, Ser 04/14/2018 0.94  0.61 - 1.24 mg/dL Final  . Calcium 04/14/2018 9.9  8.9 - 10.3 mg/dL Final  . Total Protein 04/14/2018 7.8  6.5 - 8.1 g/dL Final  . Albumin 04/14/2018 3.5  3.5 - 5.0 g/dL Final  . AST 04/14/2018 52* 15 - 41 U/L Final  . ALT 04/14/2018 37  17 - 63 U/L Final  . Alkaline Phosphatase 04/14/2018 88  38 - 126 U/L Final  . Total Bilirubin 04/14/2018 0.9  0.3 - 1.2 mg/dL Final  . GFR calc non Af Amer 04/14/2018 >60  >60 mL/min Final  . GFR calc Af Amer 04/14/2018 >60  >60 mL/min Final   Comment: (NOTE) The eGFR has been calculated using the CKD EPI equation. This calculation has not been validated in all clinical situations. eGFR's persistently <60 mL/min signify possible Chronic Kidney Disease.   Georgiann Hahn gap 04/14/2018 9  5 - 15 Final   Performed at [facility] [address] [place], [place] [address]  . WBC 04/14/2018 3.2* 3.8 - 10.6 K/uL Final  . RBC 04/14/2018 3.52* 4.40 -  5.90 MIL/uL Final  . Hemoglobin 04/14/2018 13.1  13.0 - 18.0 g/dL Final  . HCT 04/14/2018 36.9* 40.0 - 52.0 % Final  . MCV 04/14/2018 104.8* 80.0 - 100.0 fL Final  . MCH 04/14/2018 37.2* 26.0 - 34.0 pg Final  . MCHC 04/14/2018 35.5  32.0 - 36.0 g/dL Final  . RDW 04/14/2018 13.3  11.5 - 14.5 % Final  . Platelets 04/14/2018 140* 150 - 440 K/uL Final  . Neutrophils Relative % 04/14/2018 58  % Final  . Neutro Abs 04/14/2018 1.9  1.4 - 6.5 K/uL Final  . Lymphocytes Relative 04/14/2018 26  % Final  . Lymphs Abs 04/14/2018 0.8* 1.0 - 3.6 K/uL Final  . Monocytes Relative 04/14/2018 10  % Final  . Monocytes Absolute  04/14/2018 0.3  0.2 - 1.0 K/uL Final  . Eosinophils Relative 04/14/2018 4  % Final  . Eosinophils Absolute 04/14/2018 0.1  0 - 0.7 K/uL Final  . Basophils Relative 04/14/2018 2  % Final  . Basophils Absolute 04/14/2018 0.1  0 - 0.1 K/uL Final   Performed at [facility], [address] [place], [place] [address]    Assessment:  Luis Knutzen. is a 72 y.o. male with stage III multiple myeloma. He presented in 02/2013 with left-sided sharp pain. Evaluation revealed wide-spread lytic lesions including fracture of the left 5th rib laterally. CT scans on 02/22/2013 showed innumerable lytic lesions in thoracic spine, sternum, clavicle, scapula, and ribs.   Bone marrow biopsy on 03/13/2013 revealed 15 to 20% plasma cells. Iron stores were absent. Renal function was normal. SPEP on 03/01/2013 revealed a 1.4 gm/dL IgG monoclonal lambda. IgG was 1987.   He began induction with Revlimid 25 mg a day (3 weeks on and 1 week off) and Decadron (40 mg once a week) on 04/09/2013. SPEP has revealed no monoclonal protein since 07/26/2014 (last check 03/03/2018). IgG was 1362 on 07/26/2014 and 1097 on 12/19/2014.   Lambda free light chains have been monitored: 33.20 (ratio of 1.56) on 09/20/2014, 31.51 (ratio of 1.43) on 12/19/2014, 30.25 (ratio of 1.56) on 05/01/2015, 31.95 (ratio 1.49) on 01/01/2016, 28.02 (ratio 1.29) on 03/25/2016, 31.5 (ratio 1.26) on 05/28/2016, 21.8 (ratio 1.35) on 08/20/2016, 37.3 (ratio 1.09) on 10/22/2016, 46.3 (ratio 1.49) on 10/21/2017, and 54 (ratio 1.84) on 04/14/2018.  24 hour UPEP on 12/02/2015 revealed no monoclonal protein.  With remission, Revlimid was decreased to 10 mg a day then to 10 mg every other day secondary to issues with renal function and diarrhea.  Current Revlimid is 2.5 mg a day (3 weeks on/1 week off).  Last cycle of Revlimid began on 01/27/2018.  Bone survey on 05/30/2015 revealed the majority of small lytic lesions were stable.  There was  possibly new lesion in the distal left clavicle and right mid femur (small) and a possible developing lucency in the proximal left femoral shaft.  The calvarial lesion noted on the prior study was not well seen (? positional).  Bone survey on 11/27/2015 revealed widespread bony lytic lesions consistent with multiple myeloma.  The vast majority of lesions were stable.  A few small lesions were not previously seen.  One lesion was previously obscured by bowel contents.  Two small lesions in the right distal femoral diaphysis appeared new.  Bone survey on 05/25/2016 revealed no evidence of progressive myelomatous involvement of the skeleton.  Bone survey on 05/16/2017 revealed multiple lucent lesions as previously seen. There was no convincing evidence of progression or superimposed abnormality since  prior study.  He received Zometa every 3 months (last 02/28/2017). He takes calcium 4 pills/day. He receives B12 every 6 weeks (last 03/03/2018). B12 was 370 on 01/17/2015.  Folate was 17.5 on 09/17/2016.  Abdominal and pelvic CT scan on 07/15/2015 revealed  extensive sigmoid diverticulosis. There was questionable wall thickening of the ascending colon versus artifact from incomplete distention.  Colonoscopy on 11/11/2015 revealed diverticulosis in the sigmoid colon and distal descending colon and ascending colon.  There was a 2 mm polyp in the mid sigmoid colon.  Pathology revealed a hyperplastic polyp, negative for dysplasia or malignancy.  Symptomatically, he feels good.  He has chronic shortness of breath on exertion.  Exam is stable. WBC is 3200 with an [place] of 1900.  AST is 52.  Plan: 1.  Labs today:  CBC with diff, CMP, Mg, FLCA. 2.  B12 today and every 6 weeks.  3.  Continue Revlimid (3 weeks on/1 week off). 4.  RTC in 3 weeks for labs (CBC with diff, LFTs). 5.  RTC in 6 weeks for MD assessment, labs (CBC with diff, CMP, Mg, SPEP), and B12 injection.    Lequita Asal, MD 04/14/2018, 11:08 AM    I saw and evaluated the patient, participating in the key portions of the service and reviewing pertinent diagnostic studies and records.  I reviewed the nurse practitioner's note and agree with the findings and the plan.  The assessment and plan were discussed with the patient.  A few questions were asked by the patient and answered.   Nolon Stalls, MD 04/14/2018,11:08 AM

## 2018-04-17 LAB — KAPPA/LAMBDA LIGHT CHAINS
Kappa free light chain: 54 mg/L — ABNORMAL HIGH (ref 3.3–19.4)
Kappa, lambda light chain ratio: 1.84 — ABNORMAL HIGH (ref 0.26–1.65)
Lambda free light chains: 29.4 mg/L — ABNORMAL HIGH (ref 5.7–26.3)

## 2018-04-24 ENCOUNTER — Other Ambulatory Visit: Payer: Self-pay | Admitting: Urgent Care

## 2018-04-24 DIAGNOSIS — C9000 Multiple myeloma not having achieved remission: Secondary | ICD-10-CM

## 2018-05-05 ENCOUNTER — Encounter (INDEPENDENT_AMBULATORY_CARE_PROVIDER_SITE_OTHER): Payer: Self-pay

## 2018-05-05 ENCOUNTER — Telehealth: Payer: Self-pay | Admitting: *Deleted

## 2018-05-05 ENCOUNTER — Inpatient Hospital Stay: Payer: Medicare Other

## 2018-05-05 DIAGNOSIS — C9000 Multiple myeloma not having achieved remission: Secondary | ICD-10-CM

## 2018-05-05 LAB — HEPATIC FUNCTION PANEL
ALT: 40 U/L (ref 0–44)
AST: 51 U/L — ABNORMAL HIGH (ref 15–41)
Albumin: 3.4 g/dL — ABNORMAL LOW (ref 3.5–5.0)
Alkaline Phosphatase: 98 U/L (ref 38–126)
Bilirubin, Direct: 0.1 mg/dL (ref 0.0–0.2)
Indirect Bilirubin: 0.8 mg/dL (ref 0.3–0.9)
Total Bilirubin: 0.9 mg/dL (ref 0.3–1.2)
Total Protein: 7.8 g/dL (ref 6.5–8.1)

## 2018-05-05 LAB — CBC WITH DIFFERENTIAL/PLATELET
Basophils Absolute: 0.1 10*3/uL (ref 0–0.1)
Basophils Relative: 2 %
Eosinophils Absolute: 0.2 10*3/uL (ref 0–0.7)
Eosinophils Relative: 8 %
HCT: 36.7 % — ABNORMAL LOW (ref 40.0–52.0)
Hemoglobin: 12.9 g/dL — ABNORMAL LOW (ref 13.0–18.0)
Lymphocytes Relative: 31 %
Lymphs Abs: 0.8 10*3/uL — ABNORMAL LOW (ref 1.0–3.6)
MCH: 37.4 pg — ABNORMAL HIGH (ref 26.0–34.0)
MCHC: 35.2 g/dL (ref 32.0–36.0)
MCV: 106.3 fL — ABNORMAL HIGH (ref 80.0–100.0)
Monocytes Absolute: 0.4 10*3/uL (ref 0.2–1.0)
Monocytes Relative: 16 %
Neutro Abs: 1.2 10*3/uL — ABNORMAL LOW (ref 1.4–6.5)
Neutrophils Relative %: 43 %
Platelets: 136 10*3/uL — ABNORMAL LOW (ref 150–440)
RBC: 3.45 MIL/uL — ABNORMAL LOW (ref 4.40–5.90)
RDW: 14.2 % (ref 11.5–14.5)
WBC: 2.8 10*3/uL — ABNORMAL LOW (ref 3.8–10.6)

## 2018-05-05 NOTE — Telephone Encounter (Signed)
  Sounds good.  Review neutropenic precautions.  He should have follow-up labs before he starts next cycle (probably already scheduled).  M

## 2018-05-05 NOTE — Telephone Encounter (Signed)
Called patient to inquire where he is in his Revlimid cycle.  Patient states he took his last pill last night.

## 2018-05-10 ENCOUNTER — Ambulatory Visit: Payer: Medicare Other | Attending: Internal Medicine

## 2018-05-10 DIAGNOSIS — G4733 Obstructive sleep apnea (adult) (pediatric): Secondary | ICD-10-CM | POA: Diagnosis present

## 2018-05-26 ENCOUNTER — Inpatient Hospital Stay: Payer: Medicare Other

## 2018-05-26 ENCOUNTER — Ambulatory Visit: Payer: PRIVATE HEALTH INSURANCE

## 2018-05-26 ENCOUNTER — Inpatient Hospital Stay (HOSPITAL_BASED_OUTPATIENT_CLINIC_OR_DEPARTMENT_OTHER): Payer: Medicare Other | Admitting: Urgent Care

## 2018-05-26 ENCOUNTER — Inpatient Hospital Stay: Payer: Medicare Other | Attending: Hematology and Oncology

## 2018-05-26 VITALS — BP 133/80 | HR 54 | Temp 97.7°F | Resp 18 | Wt 221.2 lb

## 2018-05-26 DIAGNOSIS — Z7982 Long term (current) use of aspirin: Secondary | ICD-10-CM | POA: Insufficient documentation

## 2018-05-26 DIAGNOSIS — Z8042 Family history of malignant neoplasm of prostate: Secondary | ICD-10-CM | POA: Insufficient documentation

## 2018-05-26 DIAGNOSIS — I509 Heart failure, unspecified: Secondary | ICD-10-CM

## 2018-05-26 DIAGNOSIS — I10 Essential (primary) hypertension: Secondary | ICD-10-CM | POA: Insufficient documentation

## 2018-05-26 DIAGNOSIS — E785 Hyperlipidemia, unspecified: Secondary | ICD-10-CM

## 2018-05-26 DIAGNOSIS — E119 Type 2 diabetes mellitus without complications: Secondary | ICD-10-CM | POA: Diagnosis not present

## 2018-05-26 DIAGNOSIS — F418 Other specified anxiety disorders: Secondary | ICD-10-CM | POA: Diagnosis not present

## 2018-05-26 DIAGNOSIS — K219 Gastro-esophageal reflux disease without esophagitis: Secondary | ICD-10-CM | POA: Diagnosis not present

## 2018-05-26 DIAGNOSIS — I2510 Atherosclerotic heart disease of native coronary artery without angina pectoris: Secondary | ICD-10-CM

## 2018-05-26 DIAGNOSIS — G4733 Obstructive sleep apnea (adult) (pediatric): Secondary | ICD-10-CM

## 2018-05-26 DIAGNOSIS — R0602 Shortness of breath: Secondary | ICD-10-CM | POA: Diagnosis not present

## 2018-05-26 DIAGNOSIS — I252 Old myocardial infarction: Secondary | ICD-10-CM | POA: Diagnosis not present

## 2018-05-26 DIAGNOSIS — C9000 Multiple myeloma not having achieved remission: Secondary | ICD-10-CM

## 2018-05-26 DIAGNOSIS — Z86010 Personal history of colonic polyps: Secondary | ICD-10-CM

## 2018-05-26 DIAGNOSIS — E538 Deficiency of other specified B group vitamins: Secondary | ICD-10-CM | POA: Diagnosis not present

## 2018-05-26 DIAGNOSIS — Z87891 Personal history of nicotine dependence: Secondary | ICD-10-CM | POA: Insufficient documentation

## 2018-05-26 DIAGNOSIS — Z79899 Other long term (current) drug therapy: Secondary | ICD-10-CM

## 2018-05-26 DIAGNOSIS — R5383 Other fatigue: Secondary | ICD-10-CM

## 2018-05-26 DIAGNOSIS — G4719 Other hypersomnia: Secondary | ICD-10-CM

## 2018-05-26 LAB — COMPREHENSIVE METABOLIC PANEL
ALT: 31 U/L (ref 0–44)
AST: 44 U/L — ABNORMAL HIGH (ref 15–41)
Albumin: 3.6 g/dL (ref 3.5–5.0)
Alkaline Phosphatase: 81 U/L (ref 38–126)
Anion gap: 10 (ref 5–15)
BUN: 17 mg/dL (ref 8–23)
CO2: 19 mmol/L — ABNORMAL LOW (ref 22–32)
Calcium: 9.6 mg/dL (ref 8.9–10.3)
Chloride: 107 mmol/L (ref 98–111)
Creatinine, Ser: 0.93 mg/dL (ref 0.61–1.24)
GFR calc Af Amer: >60 mL/min (ref >60)
GFR calc non Af Amer: >60 mL/min (ref >60)
Glucose, Bld: 146 mg/dL — ABNORMAL HIGH (ref 70–99)
Potassium: 4.5 mmol/L (ref 3.5–5.1)
Sodium: 136 mmol/L (ref 135–145)
Total Bilirubin: 0.7 mg/dL (ref 0.3–1.2)
Total Protein: 7.9 g/dL (ref 6.5–8.1)

## 2018-05-26 LAB — MAGNESIUM: Magnesium: 1.7 mg/dL (ref 1.7–2.4)

## 2018-05-26 LAB — CBC WITH DIFFERENTIAL/PLATELET
Basophils Absolute: 0 10*3/uL (ref 0–0.1)
Basophils Relative: 1 %
Eosinophils Absolute: 0.2 10*3/uL (ref 0–0.7)
Eosinophils Relative: 5 %
HCT: 36.8 % — ABNORMAL LOW (ref 40.0–52.0)
Hemoglobin: 12.8 g/dL — ABNORMAL LOW (ref 13.0–18.0)
Lymphocytes Relative: 29 %
Lymphs Abs: 1 10*3/uL (ref 1.0–3.6)
MCH: 36.9 pg — ABNORMAL HIGH (ref 26.0–34.0)
MCHC: 34.8 g/dL (ref 32.0–36.0)
MCV: 106 fL — ABNORMAL HIGH (ref 80.0–100.0)
Monocytes Absolute: 0.5 10*3/uL (ref 0.2–1.0)
Monocytes Relative: 13 %
Neutro Abs: 1.9 10*3/uL (ref 1.4–6.5)
Neutrophils Relative %: 52 %
Platelets: 157 10*3/uL (ref 150–440)
RBC: 3.47 MIL/uL — ABNORMAL LOW (ref 4.40–5.90)
RDW: 14 % (ref 11.5–14.5)
WBC: 3.6 10*3/uL — ABNORMAL LOW (ref 3.8–10.6)

## 2018-05-26 MED ORDER — CYANOCOBALAMIN 1000 MCG/ML IJ SOLN
1000.0000 ug | Freq: Once | INTRAMUSCULAR | Status: AC
  Administered 2018-05-26: 1000 ug via INTRAMUSCULAR
  Filled 2018-05-26: qty 1

## 2018-05-26 NOTE — Progress Notes (Signed)
Patient states he had test for sleep apnea a couple of weeks ago. He will be retested on 06-01-18 with full mask.  States he is moving a little slow today  Denies pain.

## 2018-05-26 NOTE — Progress Notes (Signed)
. [place] day:  05/26/2018   Chief Complaint: Luis [name]. is a 72 y.o. male with multiple myeloma who is seen for 6 week assessment on Revlimid.  HPI:  The patient was last seen in the medical oncology clinic on 04/14/2018.  At that time, patient was feeling "pretty good".  He was having some loose stools.  He complained of exertional shortness of breath, however noted that was stable.  Patient having intermittent troubles with his sleep.  Denied recurrent infections.  Exam was stable.  WBC 3200 ([place] 1900).  AST 52. He received a B12 injection.   CBC on 05/05/2018 revealed a WBC of 2800 with an [place] of 1200.  Hemoglobin 12.9, hematocrit 36.7, MCV 106.3, and platelets 136,000.  AST persistently elevated at 51 (previously 52).  In the interim, patient  continues to complain of marked fatigue.  Patient states, "if I sit down in a chair I can fall asleep on a dime".  He has recently undergone a repeat PSG demonstrating OSAH syndrome, which would account for his excessive daytime sleepiness.  He is scheduled for a CPAP titration study on 06/01/2018.   Patient notes that he is trying to be more active.  He and his wife are attending the gym about 3 times a week where he is walking on the treadmill for 8 to 10 minutes before having to stop. Patient states, " it is not because I gets short of breath, rather it is because my legs just give out".  Patient does continue to experience exertional dyspnea.  Patient denies lower extremity edema.  There are no new areas of palpable adenopathy.  He denies bone pain.  Patient denies that he has experienced any B symptoms. He denies any interval infections. He completed his last cycle of Revlimid on 05/04/2018. Patient advises that he maintains an adequate appetite. He is eating well. Weight today is 221 lb 3 oz (100.3 kg), which compared to his last visit to the clinic, represents a 2 pound increase.  Patient denies  pain in the clinic today.  Past Medical History:  Diagnosis Date  . Angina pectoris ([place])   . Anxiety   . Arthritis   . CHF (congestive heart failure) ([place])   . Coronary artery disease   . Depression   . Diabetes mellitus without complication [facility])    Patient takes Metformin.  . Diverticulosis   . GERD (gastroesophageal reflux disease)   . Hyperlipidemia   . Hypertension   . Multiple myeloma ([place])   . Multiple myeloma ([place]) 03/27/2015  . Myocardial infarction [facility]) April 2001   widowmaker  . Shortness of breath dyspnea   . Sleep apnea    No CPAP @ present  . Spinal stenosis     Past Surgical History:  Procedure Laterality Date  . CARDIAC CATHETERIZATION    . CARPAL TUNNEL RELEASE    . CATARACT EXTRACTION    . COLONOSCOPY WITH PROPOFOL N/A 11/11/2015   Procedure: COLONOSCOPY WITH PROPOFOL;  Surgeon: Lollie Sails, MD;  Location: [facility] ENDOSCOPY;  Service: Endoscopy;  Laterality: N/A;  . CORONARY ARTERY BYPASS GRAFT    . EYE SURGERY Bilateral    Cataract Extraction  . INGUINAL HERNIA REPAIR    . JOINT REPLACEMENT Right 2008   Right Total Hip Replacement  . PILONIDAL CYST EXCISION    . ROTATOR CUFF REPAIR    . TOTAL HIP ARTHROPLASTY Right   . VENTRAL HERNIA REPAIR N/A  08/15/2015   Procedure: VENTRAL HERNIA REPAIR WITH MESH ;  Surgeon: Leonie Green, MD;  Location: [facility] ORS;  Service: General;  Laterality: N/A;    Family History  Problem Relation Age of Onset  . Heart disease Father   . Stroke Mother   . Prostate cancer Maternal Grandfather 41    Social History:  reports that he quit smoking about 18 years ago. His smoking use included cigarettes. He has a 60.00 pack-year smoking history. He quit smokeless tobacco use about 18 years ago. He reports that he does not drink alcohol or use drugs.  He notes that his wife goes off to work.  He states that his wife would like to retire, but that would affect his medication coverage.  He has projects that he likes to do  at home.  He does wood working.  His 42nd high school graduation anniversary was in 06/2016.  He has a new grand-daughter, [name].  His 50th anniversary is coming up.  The patient is accompanied by his wife, [name], today.  Allergies:  Allergies  Allergen Reactions  . Pravachol [Pravastatin]   . Pravastatin Sodium Other (See Comments)  . Statins Other (See Comments)    Muscle aches  . Zocor [Simvastatin] Other (See Comments)    Muscle aches    Current Medications: Current Outpatient Medications  Medication Sig Dispense Refill  . acetaminophen (TYLENOL) 500 MG tablet Take 500 mg by mouth every 6 (six) hours as needed.    . ALPRAZolam (XANAX) 0.25 MG tablet Take 0.25 mg by mouth at bedtime as needed. for sleep  3  . amoxicillin (AMOXIL) 500 MG capsule TAKE 4 CAPSULES 1 HOUR PRIOR TO DENTAL APPT.    Marland Kitchen aspirin 81 MG tablet Take 81 mg by mouth daily.     [name] Pollen 500 MG CHEW Chew 1 tablet by mouth daily.    . Calcium Carbonate-Vitamin D 600-400 MG-UNIT chew tablet Chew 4 tablets by mouth daily. Takes them throughout the day for a total of 4    . Cyanocobalamin (VITAMIN B-12 IJ) Inject 1 Dose as directed every 30 (thirty) days.     . ergocalciferol (VITAMIN D2) 50000 UNITS capsule Take 50,000 Units by mouth once a week.    . isosorbide mononitrate (IMDUR) 30 MG 24 hr tablet Take 30 mg by mouth daily.    Marland Kitchen KLOR-CON M20 20 MEQ tablet TAKE 1 TABLET (20 MEQ TOTAL) BY MOUTH DAILY. 30 tablet 2  . lisinopril (PRINIVIL,ZESTRIL) 20 MG tablet Take 1 tablet by mouth daily.    Marland Kitchen lovastatin (MEVACOR) 40 MG tablet Take 20 mg by mouth at bedtime. 2 tablets a day    . metFORMIN (GLUCOPHAGE) 500 MG tablet 500 mg 2 (two) times daily with a meal.     . omeprazole (PRILOSEC) 20 MG capsule Take 20 mg by mouth daily.    . traMADol (ULTRAM) 50 MG tablet TAKE 1 TABLET BY MOUTH 3 TIMES A DAY AS NEEDED 60 tablet 0  . Zoledronic Acid (ZOMETA) 4 MG/100ML IVPB Inject into the vein.    Marland Kitchen REVLIMID 2.5 MG capsule  TAKE 1 CAPSULE BY MOUTH ONCE DAILY FOR 21 DAYS ON, THEN 7 DAYS OFF (Patient not taking: Reported on 05/26/2018) 21 capsule 0   No current facility-administered medications for this visit.     Review of Systems  Constitutional: Positive for malaise/fatigue (severe. (+) EDS). Negative for diaphoresis, fever and weight loss (weight up 2 pound).       "  Im doing ok. I am tired, but overall I am ok". More active.   HENT: Negative.   Eyes: Negative.   Respiratory: Positive for shortness of breath (exertional). Negative for cough, hemoptysis and sputum production.        Confirmed OSAH syndrome on recent PSG. CPAP titration study scheduled for 06/01/2018.  Cardiovascular: Negative for chest pain, palpitations, orthopnea, leg swelling and PND.       Bradycardic at 54  Gastrointestinal: Positive for diarrhea (intermittent). Negative for abdominal pain, blood in stool, constipation, melena, nausea and vomiting.  Genitourinary: Negative for dysuria, frequency, hematuria and urgency.  Musculoskeletal: Positive for back pain (known spinal stenosis). Negative for falls, joint pain and myalgias.  Skin: Negative for itching and rash.  Neurological: Positive for weakness (legs "give out" with long distance ambulation. Requires a cane for balance ). Negative for dizziness, tremors and headaches.  Endo/Heme/Allergies: Bruises/bleeds easily.       Diabetes  Psychiatric/Behavioral: Negative for depression, memory loss and suicidal ideas. The patient has insomnia (intermittent). The patient is not nervous/anxious.   All other systems reviewed and are negative.  Performance status (ECOG): 1 - Symptomatic but completely ambulatory  Vital Signs BP 133/80 (BP Location: Left Arm, Patient Position: Sitting)   Pulse (!) 54   Temp 97.7 F (36.5 C) (Tympanic)   Resp 18   Wt 221 lb 3 oz (100.3 kg)   BMI 34.64 kg/m   Physical Exam  Constitutional: He is oriented to person, place, and time and well-developed,  well-nourished, and in no distress.  HENT:  Head: Normocephalic and atraumatic.  Short white hair   Eyes: Pupils are equal, round, and reactive to light. EOM are normal. No scleral icterus.  Blue eyes  Neck: Normal range of motion. Neck supple. No tracheal deviation present. No thyromegaly present.  Cardiovascular: Regular rhythm and normal heart sounds. Bradycardia present. Exam reveals no gallop and no friction rub.  No murmur heard. Pulmonary/Chest: Effort normal and breath sounds normal. No respiratory distress. He has no wheezes. He has no rales.  Abdominal: Soft. Bowel sounds are normal. He exhibits no distension. There is no tenderness.  Musculoskeletal: Normal range of motion. He exhibits no edema or tenderness.  Lymphadenopathy:    He has no cervical adenopathy.    He has no axillary adenopathy.       Right: No inguinal and no supraclavicular adenopathy present.       Left: No inguinal and no supraclavicular adenopathy present.  Neurological: He is alert and oriented to person, place, and time.  Skin: Skin is warm and dry. No rash noted. No erythema.  Psychiatric: Mood, affect and judgment normal.  Nursing note and vitals reviewed.   Appointment on 05/26/2018  Component Date Value Ref Range Status  . Magnesium 05/26/2018 1.7  1.7 - 2.4 mg/dL Final   Performed at [facility], [address] [place], [place] [address]  . Sodium 05/26/2018 136  135 - 145 mmol/L Final  . Potassium 05/26/2018 4.5  3.5 - 5.1 mmol/L Final  . Chloride 05/26/2018 107  98 - 111 mmol/L Final   Please note change in reference range.  . CO2 05/26/2018 19* 22 - 32 mmol/L Final  . Glucose, Bld 05/26/2018 146* 70 - 99 mg/dL Final   Please note change in reference range.  . BUN 05/26/2018 17  8 - 23 mg/dL Final   Please note change in reference range.  . Creatinine, Ser 05/26/2018 0.93  0.61 - 1.24 mg/dL Final  .  Calcium 05/26/2018 9.6  8.9 - 10.3 mg/dL Final  . Total Protein 05/26/2018 7.9   6.5 - 8.1 g/dL Final  . Albumin 05/26/2018 3.6  3.5 - 5.0 g/dL Final  . AST 05/26/2018 44* 15 - 41 U/L Final  . ALT 05/26/2018 31  0 - 44 U/L Final   Please note change in reference range.  . Alkaline Phosphatase 05/26/2018 81  38 - 126 U/L Final  . Total Bilirubin 05/26/2018 0.7  0.3 - 1.2 mg/dL Final  . GFR calc non Af Amer 05/26/2018 >60  >60 mL/min Final  . GFR calc Af Amer 05/26/2018 >60  >60 mL/min Final   Comment: (NOTE) The eGFR has been calculated using the CKD EPI equation. This calculation has not been validated in all clinical situations. eGFR's persistently <60 mL/min signify possible Chronic Kidney Disease.   Georgiann Hahn gap 05/26/2018 10  5 - 15 Final   Performed at [facility], [address] [place], [place] [address]  . WBC 05/26/2018 3.6* 3.8 - 10.6 K/uL Final  . RBC 05/26/2018 3.47* 4.40 - 5.90 MIL/uL Final  . Hemoglobin 05/26/2018 12.8* 13.0 - 18.0 g/dL Final  . HCT 05/26/2018 36.8* 40.0 - 52.0 % Final  . MCV 05/26/2018 106.0* 80.0 - 100.0 fL Final  . MCH 05/26/2018 36.9* 26.0 - 34.0 pg Final  . MCHC 05/26/2018 34.8  32.0 - 36.0 g/dL Final  . RDW 05/26/2018 14.0  11.5 - 14.5 % Final  . Platelets 05/26/2018 157  150 - 440 K/uL Final  . Neutrophils Relative % 05/26/2018 52  % Final  . Neutro Abs 05/26/2018 1.9  1.4 - 6.5 K/uL Final  . Lymphocytes Relative 05/26/2018 29  % Final  . Lymphs Abs 05/26/2018 1.0  1.0 - 3.6 K/uL Final  . Monocytes Relative 05/26/2018 13  % Final  . Monocytes Absolute 05/26/2018 0.5  0.2 - 1.0 K/uL Final  . Eosinophils Relative 05/26/2018 5  % Final  . Eosinophils Absolute 05/26/2018 0.2  0 - 0.7 K/uL Final  . Basophils Relative 05/26/2018 1  % Final  . Basophils Absolute 05/26/2018 0.0  0 - 0.1 K/uL Final   Performed at [facility], [address], [place], [place] [address]    Assessment:  Jadavion Spoelstra. is a 72 y.o. male with stage III multiple myeloma. He presented in 02/2013 with left-sided sharp pain.  Evaluation revealed wide-spread lytic lesions including fracture of the left 5th rib laterally. CT scans on 02/22/2013 showed innumerable lytic lesions in thoracic spine, sternum, clavicle, scapula, and ribs.   Bone marrow biopsy on 03/13/2013 revealed 15 to 20% plasma cells. Iron stores were absent. Renal function was normal. SPEP on 03/01/2013 revealed a 1.4 gm/dL IgG monoclonal lambda. IgG was 1987.   He began induction with Revlimid 25 mg a day (3 weeks on and 1 week off) and Decadron (40 mg once a week) on 04/09/2013. SPEP has revealed no monoclonal protein since 07/26/2014 (last check 03/03/2018). IgG was 1362 on 07/26/2014 and 1097 on 12/19/2014.   Lambda free light chains have been monitored: 33.20 (ratio of 1.56) on 09/20/2014, 31.51 (ratio of 1.43) on 12/19/2014, 30.25 (ratio of 1.56) on 05/01/2015, 31.95 (ratio 1.49) on 01/01/2016, 28.02 (ratio 1.29) on 03/25/2016, 31.5 (ratio 1.26) on 05/28/2016, 21.8 (ratio 1.35) on 08/20/2016, 37.3 (ratio 1.09) on 10/22/2016, 46.3 (ratio 1.49) on 10/21/2017, and 54 (ratio 1.84) on 04/14/2018.  24 hour UPEP on 12/02/2015 revealed no monoclonal protein.  With remission, Revlimid was decreased  to 10 mg a day then to 10 mg every other day secondary to issues with renal function and diarrhea.  Current Revlimid is 2.5 mg a day (3 weeks on/1 week off).  Last cycle of Revlimid ended on 05/04/2018.  Bone survey on 05/30/2015 revealed the majority of small lytic lesions were stable.  There was possibly new lesion in the distal left clavicle and right mid femur (small) and a possible developing lucency in the proximal left femoral shaft.  The calvarial lesion noted on the prior study was not well seen (? positional).  Bone survey on 11/27/2015 revealed widespread bony lytic lesions consistent with multiple myeloma.  The vast majority of lesions were stable.  A few small lesions were not previously seen.  One lesion was previously obscured by bowel contents.   Two small lesions in the right distal femoral diaphysis appeared new.  Bone survey on 05/25/2016 revealed no evidence of progressive myelomatous involvement of the skeleton.  Bone survey on 05/16/2017 revealed multiple lucent lesions as previously seen. There was no convincing evidence of progression or superimposed abnormality since prior study.  He received Zometa every 3 months (last 02/28/2017). He takes calcium 4 pills/day. He receives B12 every 6 weeks (last 04/14/2018). B12 was 370 on 01/17/2015.  Folate was 17.5 on 09/17/2016.  Abdominal and pelvic CT scan on 07/15/2015 revealed  extensive sigmoid diverticulosis. There was questionable wall thickening of the ascending colon versus artifact from incomplete distention.  Colonoscopy on 11/11/2015 revealed diverticulosis in the sigmoid colon and distal descending colon and ascending colon.  There was a 2 mm polyp in the mid sigmoid colon.  Pathology revealed a hyperplastic polyp, negative for dysplasia or malignancy.  Symptomatically, patient has significant fatigue. (+) excessive daytime sleepiness. Recent confirmed diagnoses of OSAH syndrome. He will have CPAP titration study on 06/01/2018. He is chronically dyspneic with exertion. Exam is stable.  WBC 37 with an ANC of 1900.  Hemoglobin 12.8.  Platelets 157,000.  BUN 17 and creatinine 0.93 (CrCl 79.8 mL/min).  AST elevated at 44 (15-41 U/L).  SPEP is pending at time of visit.   Plan: 1. Labs today:  CBC with diff, CMP, Mg, SPEP 2. Discuss severe fatigue.  Patient with significant excessive daytime sleepiness.  Recent PSG showed that patient was positive for OSAH syndrome. He is scheduled for a CPAP titration study on 06/01/2018.  Explained to patient that as his sleep apnea is managed, he should expect to appreciate some improvement in his level of fatigue as well. 3. Schedule bone survey. 4. B12 today and every 6 weeks.  5. Continue Revlimid (3 weeks on/1 week off).  Blood counts are stable  and adequate for treatment.  Patient will begin a new cycle today.   6. RTC in 3 weeks for labs (CBC with diff, LFTs). 7. RTC in 6 weeks for MD assessment, labs (CBC with diff, CMP, Mg, SPEP), and B12 injection.    Honor Loh, NP 05/26/2018, 2:27 PM

## 2018-05-29 ENCOUNTER — Other Ambulatory Visit: Payer: Self-pay | Admitting: Urgent Care

## 2018-05-29 ENCOUNTER — Telehealth: Payer: Self-pay | Admitting: *Deleted

## 2018-05-29 DIAGNOSIS — C9000 Multiple myeloma not having achieved remission: Secondary | ICD-10-CM

## 2018-05-29 LAB — PROTEIN ELECTROPHORESIS, SERUM
A/G Ratio: 0.8 (ref 0.7–1.7)
Albumin ELP: 3.3 g/dL (ref 2.9–4.4)
Alpha-1-Globulin: 0.3 g/dL (ref 0.0–0.4)
Alpha-2-Globulin: 0.8 g/dL (ref 0.4–1.0)
Beta Globulin: 1.5 g/dL — ABNORMAL HIGH (ref 0.7–1.3)
Gamma Globulin: 1.6 g/dL (ref 0.4–1.8)
Globulin, Total: 4.1 g/dL — ABNORMAL HIGH (ref 2.2–3.9)
Total Protein ELP: 7.4 g/dL (ref 6.0–8.5)

## 2018-05-29 NOTE — Telephone Encounter (Signed)
Called patient to inform him that his SPEP was normal  M-Spike 0.  Patient verbalized understanding and thanked me for the call.

## 2018-05-29 NOTE — Telephone Encounter (Signed)
-----   Message from Karen Kitchens, NP sent at 05/29/2018  3:50 PM EDT ----- SPEP was normal. There was no evidence of a monoclonal protein (M-spike 0).   Gaspar Bidding  ----- Message ----- From: Buel Ream, [facility] [place] Sent: 05/26/2018   2:02 PM To: Lequita Asal, MD

## 2018-06-01 ENCOUNTER — Ambulatory Visit: Payer: Medicare Other | Attending: Internal Medicine

## 2018-06-01 ENCOUNTER — Ambulatory Visit
Admission: RE | Admit: 2018-06-01 | Discharge: 2018-06-01 | Disposition: A | Discharge status: Home / Self Care | Payer: Medicare Other | Source: Ambulatory Visit | Attending: Urgent Care | Admitting: Urgent Care

## 2018-06-01 DIAGNOSIS — C9000 Multiple myeloma not having achieved remission: Secondary | ICD-10-CM | POA: Diagnosis present

## 2018-06-01 DIAGNOSIS — M5134 Other intervertebral disc degeneration, thoracic region: Secondary | ICD-10-CM | POA: Diagnosis not present

## 2018-06-01 DIAGNOSIS — G4761 Periodic limb movement disorder: Secondary | ICD-10-CM | POA: Diagnosis present

## 2018-06-01 DIAGNOSIS — Z951 Presence of aortocoronary bypass graft: Secondary | ICD-10-CM | POA: Insufficient documentation

## 2018-06-01 DIAGNOSIS — Z96641 Presence of right artificial hip joint: Secondary | ICD-10-CM | POA: Insufficient documentation

## 2018-06-01 DIAGNOSIS — M5136 Other intervertebral disc degeneration, lumbar region: Secondary | ICD-10-CM | POA: Diagnosis not present

## 2018-06-01 DIAGNOSIS — G4733 Obstructive sleep apnea (adult) (pediatric): Secondary | ICD-10-CM | POA: Diagnosis not present

## 2018-06-01 DIAGNOSIS — M899 Disorder of bone, unspecified: Secondary | ICD-10-CM | POA: Diagnosis not present

## 2018-06-01 DIAGNOSIS — I700 Atherosclerosis of aorta: Secondary | ICD-10-CM | POA: Diagnosis not present

## 2018-06-02 ENCOUNTER — Other Ambulatory Visit: Payer: PRIVATE HEALTH INSURANCE

## 2018-06-02 ENCOUNTER — Ambulatory Visit: Payer: PRIVATE HEALTH INSURANCE | Admitting: Urgent Care

## 2018-06-02 ENCOUNTER — Ambulatory Visit: Payer: PRIVATE HEALTH INSURANCE

## 2018-06-16 ENCOUNTER — Inpatient Hospital Stay: Payer: Medicare Other | Attending: Hematology and Oncology

## 2018-06-16 DIAGNOSIS — I2510 Atherosclerotic heart disease of native coronary artery without angina pectoris: Secondary | ICD-10-CM | POA: Diagnosis not present

## 2018-06-16 DIAGNOSIS — Z7982 Long term (current) use of aspirin: Secondary | ICD-10-CM | POA: Insufficient documentation

## 2018-06-16 DIAGNOSIS — Z79899 Other long term (current) drug therapy: Secondary | ICD-10-CM | POA: Insufficient documentation

## 2018-06-16 DIAGNOSIS — E119 Type 2 diabetes mellitus without complications: Secondary | ICD-10-CM | POA: Insufficient documentation

## 2018-06-16 DIAGNOSIS — M4800 Spinal stenosis, site unspecified: Secondary | ICD-10-CM | POA: Insufficient documentation

## 2018-06-16 DIAGNOSIS — G4733 Obstructive sleep apnea (adult) (pediatric): Secondary | ICD-10-CM | POA: Insufficient documentation

## 2018-06-16 DIAGNOSIS — I10 Essential (primary) hypertension: Secondary | ICD-10-CM | POA: Diagnosis not present

## 2018-06-16 DIAGNOSIS — K5730 Diverticulosis of large intestine without perforation or abscess without bleeding: Secondary | ICD-10-CM | POA: Insufficient documentation

## 2018-06-16 DIAGNOSIS — Z8042 Family history of malignant neoplasm of prostate: Secondary | ICD-10-CM | POA: Insufficient documentation

## 2018-06-16 DIAGNOSIS — Z87891 Personal history of nicotine dependence: Secondary | ICD-10-CM | POA: Insufficient documentation

## 2018-06-16 DIAGNOSIS — R0602 Shortness of breath: Secondary | ICD-10-CM | POA: Diagnosis not present

## 2018-06-16 DIAGNOSIS — E785 Hyperlipidemia, unspecified: Secondary | ICD-10-CM | POA: Insufficient documentation

## 2018-06-16 DIAGNOSIS — E538 Deficiency of other specified B group vitamins: Secondary | ICD-10-CM | POA: Diagnosis not present

## 2018-06-16 DIAGNOSIS — K219 Gastro-esophageal reflux disease without esophagitis: Secondary | ICD-10-CM | POA: Diagnosis not present

## 2018-06-16 DIAGNOSIS — C9000 Multiple myeloma not having achieved remission: Secondary | ICD-10-CM | POA: Diagnosis present

## 2018-06-16 DIAGNOSIS — I509 Heart failure, unspecified: Secondary | ICD-10-CM | POA: Insufficient documentation

## 2018-06-16 DIAGNOSIS — I252 Old myocardial infarction: Secondary | ICD-10-CM | POA: Diagnosis not present

## 2018-06-16 DIAGNOSIS — F418 Other specified anxiety disorders: Secondary | ICD-10-CM | POA: Diagnosis not present

## 2018-06-16 DIAGNOSIS — Z7984 Long term (current) use of oral hypoglycemic drugs: Secondary | ICD-10-CM | POA: Insufficient documentation

## 2018-06-16 LAB — CBC WITH DIFFERENTIAL/PLATELET
Basophils Absolute: 0.1 10*3/uL (ref 0–0.1)
Basophils Relative: 2 %
EOS PCT: 12 %
Eosinophils Absolute: 0.4 10*3/uL (ref 0–0.7)
HCT: 33.8 % — ABNORMAL LOW (ref 40.0–52.0)
Hemoglobin: 11.7 g/dL — ABNORMAL LOW (ref 13.0–18.0)
LYMPHS ABS: 1 10*3/uL (ref 1.0–3.6)
Lymphocytes Relative: 32 %
MCH: 37.1 pg — AB (ref 26.0–34.0)
MCHC: 34.7 g/dL (ref 32.0–36.0)
MCV: 107 fL — ABNORMAL HIGH (ref 80.0–100.0)
MONO ABS: 0.5 10*3/uL (ref 0.2–1.0)
Monocytes Relative: 15 %
Neutro Abs: 1.2 10*3/uL — ABNORMAL LOW (ref 1.4–6.5)
Neutrophils Relative %: 39 %
PLATELETS: 135 10*3/uL — AB (ref 150–440)
RBC: 3.16 MIL/uL — ABNORMAL LOW (ref 4.40–5.90)
RDW: 14.5 % (ref 11.5–14.5)
WBC: 3.1 10*3/uL — ABNORMAL LOW (ref 3.8–10.6)

## 2018-06-16 LAB — HEPATIC FUNCTION PANEL
ALBUMIN: 3.3 g/dL — AB (ref 3.5–5.0)
ALK PHOS: 82 U/L (ref 38–126)
ALT: 34 U/L (ref 0–44)
AST: 48 U/L — ABNORMAL HIGH (ref 15–41)
Bilirubin, Direct: 0.1 mg/dL (ref 0.0–0.2)
Indirect Bilirubin: 0.6 mg/dL (ref 0.3–0.9)
TOTAL PROTEIN: 7.8 g/dL (ref 6.5–8.1)
Total Bilirubin: 0.7 mg/dL (ref 0.3–1.2)

## 2018-06-20 ENCOUNTER — Telehealth: Payer: Self-pay | Admitting: *Deleted

## 2018-06-20 NOTE — Telephone Encounter (Signed)
[employer] auth number getting ready to expire, patient has not completed their survey

## 2018-06-20 NOTE — Telephone Encounter (Signed)
Called patient's home and spoke to his wife, [name], regarding his [employer] survey.  Authorization number is about to expire.  [name] states she thinks he did this last night when he called to reorder his medication.  He is not at home at the moment but she will check with him when he returns. If he did not do it last night she will have him do so.  I gave her the [place] phone number.

## 2018-06-20 NOTE — Telephone Encounter (Signed)
  Please call patient to complete survey  M

## 2018-07-07 ENCOUNTER — Encounter: Payer: Self-pay | Admitting: Hematology and Oncology

## 2018-07-07 ENCOUNTER — Inpatient Hospital Stay: Payer: Medicare Other

## 2018-07-07 ENCOUNTER — Inpatient Hospital Stay (HOSPITAL_BASED_OUTPATIENT_CLINIC_OR_DEPARTMENT_OTHER): Payer: Medicare Other | Admitting: Hematology and Oncology

## 2018-07-07 ENCOUNTER — Other Ambulatory Visit: Payer: Self-pay | Admitting: Hematology and Oncology

## 2018-07-07 VITALS — BP 139/94 | HR 68 | Temp 98.4°F | Resp 18 | Wt 223.9 lb

## 2018-07-07 DIAGNOSIS — Z79899 Other long term (current) drug therapy: Secondary | ICD-10-CM

## 2018-07-07 DIAGNOSIS — E538 Deficiency of other specified B group vitamins: Secondary | ICD-10-CM

## 2018-07-07 DIAGNOSIS — M4800 Spinal stenosis, site unspecified: Secondary | ICD-10-CM

## 2018-07-07 DIAGNOSIS — K5730 Diverticulosis of large intestine without perforation or abscess without bleeding: Secondary | ICD-10-CM | POA: Diagnosis not present

## 2018-07-07 DIAGNOSIS — R5383 Other fatigue: Secondary | ICD-10-CM

## 2018-07-07 DIAGNOSIS — I509 Heart failure, unspecified: Secondary | ICD-10-CM

## 2018-07-07 DIAGNOSIS — Z7984 Long term (current) use of oral hypoglycemic drugs: Secondary | ICD-10-CM

## 2018-07-07 DIAGNOSIS — C9000 Multiple myeloma not having achieved remission: Secondary | ICD-10-CM

## 2018-07-07 DIAGNOSIS — Z87891 Personal history of nicotine dependence: Secondary | ICD-10-CM

## 2018-07-07 DIAGNOSIS — I2510 Atherosclerotic heart disease of native coronary artery without angina pectoris: Secondary | ICD-10-CM

## 2018-07-07 DIAGNOSIS — K219 Gastro-esophageal reflux disease without esophagitis: Secondary | ICD-10-CM

## 2018-07-07 DIAGNOSIS — Z7982 Long term (current) use of aspirin: Secondary | ICD-10-CM

## 2018-07-07 DIAGNOSIS — F418 Other specified anxiety disorders: Secondary | ICD-10-CM

## 2018-07-07 DIAGNOSIS — R0602 Shortness of breath: Secondary | ICD-10-CM

## 2018-07-07 DIAGNOSIS — E119 Type 2 diabetes mellitus without complications: Secondary | ICD-10-CM

## 2018-07-07 DIAGNOSIS — I10 Essential (primary) hypertension: Secondary | ICD-10-CM

## 2018-07-07 DIAGNOSIS — Z8042 Family history of malignant neoplasm of prostate: Secondary | ICD-10-CM

## 2018-07-07 DIAGNOSIS — E785 Hyperlipidemia, unspecified: Secondary | ICD-10-CM

## 2018-07-07 DIAGNOSIS — G4733 Obstructive sleep apnea (adult) (pediatric): Secondary | ICD-10-CM

## 2018-07-07 DIAGNOSIS — I252 Old myocardial infarction: Secondary | ICD-10-CM

## 2018-07-07 LAB — COMPREHENSIVE METABOLIC PANEL
ALT: 34 U/L (ref 0–44)
AST: 51 U/L — AB (ref 15–41)
Albumin: 3.5 g/dL (ref 3.5–5.0)
Alkaline Phosphatase: 76 U/L (ref 38–126)
Anion gap: 6 (ref 5–15)
BILIRUBIN TOTAL: 0.7 mg/dL (ref 0.3–1.2)
BUN: 22 mg/dL (ref 8–23)
CO2: 22 mmol/L (ref 22–32)
Calcium: 10.2 mg/dL (ref 8.9–10.3)
Chloride: 107 mmol/L (ref 98–111)
Creatinine, Ser: 1.01 mg/dL (ref 0.61–1.24)
Glucose, Bld: 148 mg/dL — ABNORMAL HIGH (ref 70–99)
POTASSIUM: 4.7 mmol/L (ref 3.5–5.1)
Sodium: 135 mmol/L (ref 135–145)
TOTAL PROTEIN: 8 g/dL (ref 6.5–8.1)

## 2018-07-07 LAB — CBC WITH DIFFERENTIAL/PLATELET
BASOS ABS: 0 10*3/uL (ref 0–0.1)
Basophils Relative: 1 %
EOS PCT: 5 %
Eosinophils Absolute: 0.2 10*3/uL (ref 0–0.7)
HCT: 33.5 % — ABNORMAL LOW (ref 40.0–52.0)
Hemoglobin: 11.7 g/dL — ABNORMAL LOW (ref 13.0–18.0)
LYMPHS PCT: 26 %
Lymphs Abs: 0.9 10*3/uL — ABNORMAL LOW (ref 1.0–3.6)
MCH: 37.9 pg — ABNORMAL HIGH (ref 26.0–34.0)
MCHC: 35 g/dL (ref 32.0–36.0)
MCV: 108.3 fL — AB (ref 80.0–100.0)
Monocytes Absolute: 0.4 10*3/uL (ref 0.2–1.0)
Monocytes Relative: 13 %
Neutro Abs: 2 10*3/uL (ref 1.4–6.5)
Neutrophils Relative %: 55 %
PLATELETS: 144 10*3/uL — AB (ref 150–440)
RBC: 3.09 MIL/uL — AB (ref 4.40–5.90)
RDW: 13.8 % (ref 11.5–14.5)
WBC: 3.6 10*3/uL — AB (ref 3.8–10.6)

## 2018-07-07 LAB — TSH: TSH: 3.09 u[IU]/mL (ref 0.350–4.500)

## 2018-07-07 LAB — MAGNESIUM: Magnesium: 1.7 mg/dL (ref 1.7–2.4)

## 2018-07-07 MED ORDER — CYANOCOBALAMIN 1000 MCG/ML IJ SOLN
1000.0000 ug | Freq: Once | INTRAMUSCULAR | Status: AC
  Administered 2018-07-07: 1000 ug via INTRAMUSCULAR
  Filled 2018-07-07: qty 1

## 2018-07-07 NOTE — Progress Notes (Signed)
Patient here for follow up. Pt states he started using Cpap machine about 1.5 weeks ago.

## 2018-07-07 NOTE — Progress Notes (Signed)
. [facility] day:  07/07/2018   Chief Complaint: [name]. is a 72 y.o. male with multiple myeloma who is seen for 6 week assessment on Revlimid.  HPI:  The patient was last seen in the medical oncology clinic on 05/26/2018.  At that time, he noted significant fatigue and excessive daytime sleepiness. He was recently diagnosed with OSAH syndrome.  CPAP titration study was plannned on 06/01/2018. He was chronically dyspneic with exertion. Exam was stable.  WBC was 3600 with an ANC of 1900.  Hemoglobin 12.8.  Platelets 157,000.  BUN 17 and creatinine 0.93 (CrCl 79.8 mL/min).  AST was elevated at 44 (15-41 U/L).  M-spike was 0.   He received B12 on 05/26/2018.  Bone survey on 06/01/2018 revealed scattered lucent lesions, with no evidence for progression.  The previously seen lucent lesions in the anterior L4 and L5 vertebral bodies were not as well visualized on today's study which may be projectional.  There was diffuse degenerative changes in the spine  CBC on 06/16/2018 revealed a hematcriit of 33.8, hemoglobin 11.7, MCV 107.0, platelets 135,000, WBC 3100 with an ANC of 1200.  LFTs revealed a AST 48.  During the interim, he has done fairly well.  He does note falling the night before last.  EMS was called.  He is working out at [employer].  He is taking calcium 4 pills/day (2400 mg).   Past Medical History:  Diagnosis Date  . Angina pectoris ([place])   . Anxiety   . Arthritis   . CHF (congestive heart failure) ([place])   . Coronary artery disease   . Depression   . Diabetes mellitus without complication [facility])    Patient takes Metformin.  . Diverticulosis   . GERD (gastroesophageal reflux disease)   . Hyperlipidemia   . Hypertension   . Multiple myeloma ([place])   . Multiple myeloma ([place]) 03/27/2015  . Myocardial infarction [facility]) April 2001   widowmaker  . Shortness of breath dyspnea   . Sleep apnea    No CPAP @ present  . Spinal  stenosis     Past Surgical History:  Procedure Laterality Date  . CARDIAC CATHETERIZATION    . CARPAL TUNNEL RELEASE    . CATARACT EXTRACTION    . COLONOSCOPY WITH PROPOFOL N/A 11/11/2015   Procedure: COLONOSCOPY WITH PROPOFOL;  Surgeon: Lollie Sails, MD;  Location: [facility]COPY;  Service: Endoscopy;  Laterality: N/A;  . CORONARY ARTERY BYPASS GRAFT    . EYE SURGERY Bilateral    Cataract Extraction  . INGUINAL HERNIA REPAIR    . JOINT REPLACEMENT Right 2008   Right Total Hip Replacement  . PILONIDAL CYST EXCISION    . ROTATOR CUFF REPAIR    . TOTAL HIP ARTHROPLASTY Right   . VENTRAL HERNIA REPAIR N/A 08/15/2015   Procedure: VENTRAL HERNIA REPAIR WITH MESH ;  Surgeon: Leonie Green, MD;  Location: [facility] ORS;  Service: General;  Laterality: N/A;    Family History  Problem Relation Age of Onset  . Heart disease Father   . Stroke Mother   . Prostate cancer Maternal Grandfather 11    Social History:  reports that he quit smoking about 18 years ago. His smoking use included cigarettes. He has a 60.00 pack-year smoking history. He quit smokeless tobacco use about 18 years ago. He reports that he does not drink alcohol or use drugs.  He notes that his wife goes off to work.  He states that his wife would like to retire, but that would affect his medication coverage.  He has projects that he likes to do at home.  He does wood working.  His 42nd high school graduation anniversary was in 06/2016.  He has a new grand-daughter, Luis [name].  His 50th anniversary is coming up.  The patient is accompanied by his wife, Luis [name], today.  Allergies:  Allergies  Allergen Reactions  . Pravachol [Pravastatin]   . Pravastatin Sodium Other (See Comments)  . Statins Other (See Comments)    Muscle aches  . Zocor [Simvastatin] Other (See Comments)    Muscle aches    Current Medications: Current Outpatient Medications  Medication Sig Dispense Refill  . acetaminophen (TYLENOL) 500 MG tablet  Take 500 mg by mouth every 6 (six) hours as needed.    . ALPRAZolam (XANAX) 0.25 MG tablet Take 0.25 mg by mouth at bedtime as needed. for sleep  3  . amoxicillin (AMOXIL) 500 MG capsule TAKE 4 CAPSULES 1 HOUR PRIOR TO DENTAL APPT.    Marland Kitchen aspirin 81 MG tablet Take 81 mg by mouth daily.     [name] Pollen 500 MG CHEW Chew 1 tablet by mouth daily.    . Calcium Carbonate-Vitamin D 600-400 MG-UNIT chew tablet Chew 4 tablets by mouth daily. Takes them throughout the day for a total of 4    . Cyanocobalamin (VITAMIN B-12 IJ) Inject 1 Dose as directed every 30 (thirty) days.     . ergocalciferol (VITAMIN D2) 50000 UNITS capsule Take 50,000 Units by mouth once a week.    . isosorbide mononitrate (IMDUR) 30 MG 24 hr tablet Take 30 mg by mouth daily.    Marland Kitchen lisinopril (PRINIVIL,ZESTRIL) 20 MG tablet Take 1 tablet by mouth daily.    Marland Kitchen lovastatin (MEVACOR) 40 MG tablet Take 20 mg by mouth at bedtime. 2 tablets a day    . metFORMIN (GLUCOPHAGE) 500 MG tablet 500 mg 2 (two) times daily with a meal.     . omeprazole (PRILOSEC) 20 MG capsule Take 20 mg by mouth daily.    Marland Kitchen REVLIMID 2.5 MG capsule TAKE 1 CAPSULE BY MOUTH ONCE DAILY FOR 21 DAYS ON, THEN 7 DAYS OFF 21 capsule 0  . traMADol (ULTRAM) 50 MG tablet TAKE 1 TABLET BY MOUTH 3 TIMES A DAY AS NEEDED 60 tablet 0  . KLOR-CON M20 20 MEQ tablet TAKE 1 TABLET (20 MEQ TOTAL) BY MOUTH DAILY. (Patient not taking: Reported on 07/07/2018) 30 tablet 2  . Zoledronic Acid (ZOMETA) 4 MG/100ML IVPB Inject into the vein.     No current facility-administered medications for this visit.     Review of Systems  Constitutional: Positive for malaise/fatigue. Negative for chills, diaphoresis, fever and weight loss (up 2 pound).       Feels "ok".  HENT: Negative.  Negative for congestion, ear discharge, ear pain, nosebleeds, sinus pain, sore throat and tinnitus.   Eyes: Negative.  Negative for blurred vision, double vision, photophobia, pain, discharge and redness.  Respiratory:  Positive for shortness of breath (exertional). Negative for cough, hemoptysis and sputum production.        Sleep apnea.  Cardiovascular: Negative.  Negative for chest pain, palpitations, orthopnea, leg swelling and PND.  Gastrointestinal: Negative.  Negative for abdominal pain, blood in stool, constipation, diarrhea, melena, nausea and vomiting.  Genitourinary: Negative.  Negative for dysuria, frequency, hematuria and urgency.  Musculoskeletal: Positive for back pain (secondary to spinal stenosis) and falls. Negative for  joint pain and myalgias.  Skin: Negative.  Negative for itching and rash.  Neurological: Positive for weakness (uses a cane for balance). Negative for dizziness, tingling, tremors, sensory change, speech change and focal weakness.  Endo/Heme/Allergies: Bruises/bleeds easily.       Diabetes.  Psychiatric/Behavioral: Negative for depression and memory loss. The patient is not nervous/anxious and does not have insomnia.   All other systems reviewed and are negative.  Performance status (ECOG): 1 - Symptomatic but completely ambulatory  Vital Signs BP (!) 139/94 (BP Location: Right Arm, Patient Position: Sitting)   Pulse 68   Temp 98.4 F (36.9 C) (Oral)   Resp 18   Wt 223 lb 14.4 oz (101.6 kg)   BMI 35.07 kg/m   Physical Exam  Constitutional: He is oriented to person, place, and time and well-developed, well-nourished, and in no distress. No distress.  He has a cane at his side.  HENT:  Head: Normocephalic and atraumatic.  Mouth/Throat: No oropharyngeal exudate.  Short white hair   Eyes: Pupils are equal, round, and reactive to light. Conjunctivae and EOM are normal. No scleral icterus.  Blue eyes  Neck: Normal range of motion. Neck supple. No JVD present.  Cardiovascular: Regular rhythm and normal heart sounds. Exam reveals no gallop and no friction rub.  No murmur heard. Pulmonary/Chest: Effort normal and breath sounds normal. No respiratory distress. He has no  wheezes. He has no rales.  Abdominal: Soft. Bowel sounds are normal. He exhibits no distension and no mass. There is no tenderness. There is no rebound and no guarding.  Musculoskeletal: Normal range of motion. He exhibits no edema or tenderness.  Lymphadenopathy:    He has no cervical adenopathy.    He has no axillary adenopathy.       Right: No inguinal and no supraclavicular adenopathy present.       Left: No inguinal and no supraclavicular adenopathy present.  Neurological: He is alert and oriented to person, place, and time. Gait normal.  Skin: Skin is warm and dry. No rash noted. He is not diaphoretic. No erythema.  Psychiatric: Mood, affect and judgment normal.  Nursing note and vitals reviewed.   Appointment on 07/07/2018  Component Date Value Ref Range Status  . Magnesium 07/07/2018 1.7  1.7 - 2.4 mg/dL Final   Performed at [facility], [address], [place], [place] [address]  . Sodium 07/07/2018 135  135 - 145 mmol/L Final  . Potassium 07/07/2018 4.7  3.5 - 5.1 mmol/L Final  . Chloride 07/07/2018 107  98 - 111 mmol/L Final  . CO2 07/07/2018 22  22 - 32 mmol/L Final  . Glucose, Bld 07/07/2018 148* 70 - 99 mg/dL Final  . BUN 07/07/2018 22  8 - 23 mg/dL Final  . Creatinine, Ser 07/07/2018 1.01  0.61 - 1.24 mg/dL Final  . Calcium 07/07/2018 10.2  8.9 - 10.3 mg/dL Final  . Total Protein 07/07/2018 8.0  6.5 - 8.1 g/dL Final  . Albumin 07/07/2018 3.5  3.5 - 5.0 g/dL Final  . AST 07/07/2018 51* 15 - 41 U/L Final  . ALT 07/07/2018 34  0 - 44 U/L Final  . Alkaline Phosphatase 07/07/2018 76  38 - 126 U/L Final  . Total Bilirubin 07/07/2018 0.7  0.3 - 1.2 mg/dL Final  . GFR calc non Af Amer 07/07/2018 >60  >60 mL/min Final  . GFR calc Af Amer 07/07/2018 >60  >60 mL/min Final   Comment: (NOTE) The eGFR has been calculated using  the CKD EPI equation. This calculation has not been validated in all clinical situations. eGFR's persistently <60 mL/min signify possible Chronic  Kidney Disease.   Georgiann Hahn gap 07/07/2018 6  5 - 15 Final   Performed at [facility], [address] [place], [place] [address]  . WBC 07/07/2018 3.6* 3.8 - 10.6 K/uL Final  . RBC 07/07/2018 3.09* 4.40 - 5.90 MIL/uL Final  . Hemoglobin 07/07/2018 11.7* 13.0 - 18.0 g/dL Final  . HCT 07/07/2018 33.5* 40.0 - 52.0 % Final  . MCV 07/07/2018 108.3* 80.0 - 100.0 fL Final  . MCH 07/07/2018 37.9* 26.0 - 34.0 pg Final  . MCHC 07/07/2018 35.0  32.0 - 36.0 g/dL Final  . RDW 07/07/2018 13.8  11.5 - 14.5 % Final  . Platelets 07/07/2018 144* 150 - 440 K/uL Final  . Neutrophils Relative % 07/07/2018 55  % Final  . Neutro Abs 07/07/2018 2.0  1.4 - 6.5 K/uL Final  . Lymphocytes Relative 07/07/2018 26  % Final  . Lymphs Abs 07/07/2018 0.9* 1.0 - 3.6 K/uL Final  . Monocytes Relative 07/07/2018 13  % Final  . Monocytes Absolute 07/07/2018 0.4  0.2 - 1.0 K/uL Final  . Eosinophils Relative 07/07/2018 5  % Final  . Eosinophils Absolute 07/07/2018 0.2  0 - 0.7 K/uL Final  . Basophils Relative 07/07/2018 1  % Final  . Basophils Absolute 07/07/2018 0.0  0 - 0.1 K/uL Final   Performed at [facility], [address] [place], [place] [address]    Assessment:  Jassiel Flye. is a 72 y.o. male with stage III multiple myeloma. He presented in 02/2013 with left-sided sharp pain. Evaluation revealed wide-spread lytic lesions including fracture of the left 5th rib laterally. CT scans on 02/22/2013 showed innumerable lytic lesions in thoracic spine, sternum, clavicle, scapula, and ribs.   Bone marrow biopsy on 03/13/2013 revealed 15 to 20% plasma cells. Iron stores were absent. Renal function was normal. SPEP on 03/01/2013 revealed a 1.4 gm/dL IgG monoclonal lambda. IgG was 1987.   He began induction with Revlimid 25 mg a day (3 weeks on and 1 week off) and Decadron (40 mg once a week) on 04/09/2013. SPEP has revealed no monoclonal protein since 07/26/2014 (last check 07/07/2018). IgG was 1362  on 07/26/2014 and 1097 on 12/19/2014.   Lambda free light chains have been monitored: 33.20 (ratio of 1.56) on 09/20/2014, 31.51 (ratio of 1.43) on 12/19/2014, 30.25 (ratio of 1.56) on 05/01/2015, 31.95 (ratio 1.49) on 01/01/2016, 28.02 (ratio 1.29) on 03/25/2016, 31.5 (ratio 1.26) on 05/28/2016, 21.8 (ratio 1.35) on 08/20/2016, 37.3 (ratio 1.09) on 10/22/2016, 46.3 (ratio 1.49) on 10/21/2017, and 54 (ratio 1.84) on 04/14/2018.  24 hour UPEP on 12/02/2015 revealed no monoclonal protein.  With remission, Revlimid was decreased to 10 mg a day then to 10 mg every other day secondary to issues with renal function and diarrhea.  Current Revlimid is 2.5 mg a day (3 weeks on/1 week off).  Last cycle of Revlimid ended on 05/04/2018.  Bone survey on 05/30/2015 revealed the majority of small lytic lesions were stable.  There was possibly new lesion in the distal left clavicle and right mid femur (small) and a possible developing lucency in the proximal left femoral shaft.  The calvarial lesion noted on the prior study was not well seen (? positional).  Bone survey on 11/27/2015 revealed widespread bony lytic lesions consistent with multiple myeloma.  The vast majority of lesions were stable.  A few  small lesions were not previously seen.  One lesion was previously obscured by bowel contents.  Two small lesions in the right distal femoral diaphysis appeared new.  Bone survey on 05/25/2016 revealed no evidence of progressive myelomatous involvement of the skeleton.  Bone survey on 05/16/2017 revealed multiple lucent lesions as previously seen. There was no convincing evidence of progression or superimposed abnormality since prior study.  Bone survey on 06/01/2018 revealed scattered lucent lesions, with no evidence for progression.   He received Zometa every 3 months (last 02/28/2017). He takes calcium 4 pills/day. He receives B12 every 6 weeks (last 05/26/2018). B12 was 370 on 01/17/2015 and 365 on 03/03/2018.   Folate was 17.5 on 09/17/2016 and 33 on 03/03/2018.  Abdominal and pelvic CT scan on 07/15/2015 revealed  extensive sigmoid diverticulosis. There was questionable wall thickening of the ascending colon versus artifact from incomplete distention.  Colonoscopy on 11/11/2015 revealed diverticulosis in the sigmoid colon and distal descending colon and ascending colon.  There was a 2 mm polyp in the mid sigmoid colon.  Pathology revealed a hyperplastic polyp, negative for dysplasia or malignancy.  Symptomatically, he feels "ok".  He had a recent fall.  Exam is stable.  Calcium is 10.2.  M-spike is 0.  Plan: 1.  Labs today:  CBC with diff, CMP, Mg, SPEP, TSH. 2.  Multiple myeloma:  Clinically doing well.  Bone survey negative.  Counts stable.  M-spike 0.  Begin next cycle of Revlimid. 3.  Hypercalcemia:   Decrease calcium to 1200 mg/day. 4.  B12 deficiency:   Continue monthly B12.   B12 due today. 5.  RTC in 6 weeks for MD assessment, labs (CBC with diff, CMP, Mg, SPEP), and B12 injection.   Lequita Asal, MD 07/07/2018, 3:18 PM

## 2018-07-10 LAB — PROTEIN ELECTROPHORESIS, SERUM
A/G RATIO SPE: 0.8 (ref 0.7–1.7)
ALBUMIN ELP: 3.4 g/dL (ref 2.9–4.4)
ALPHA-1-GLOBULIN: 0.2 g/dL (ref 0.0–0.4)
ALPHA-2-GLOBULIN: 0.7 g/dL (ref 0.4–1.0)
Beta Globulin: 1.7 g/dL — ABNORMAL HIGH (ref 0.7–1.3)
GLOBULIN, TOTAL: 4.1 g/dL — AB (ref 2.2–3.9)
Gamma Globulin: 1.4 g/dL (ref 0.4–1.8)
TOTAL PROTEIN ELP: 7.5 g/dL (ref 6.0–8.5)

## 2018-07-17 ENCOUNTER — Other Ambulatory Visit: Payer: Self-pay | Admitting: Urgent Care

## 2018-07-17 DIAGNOSIS — C9000 Multiple myeloma not having achieved remission: Secondary | ICD-10-CM

## 2018-08-18 ENCOUNTER — Inpatient Hospital Stay (HOSPITAL_BASED_OUTPATIENT_CLINIC_OR_DEPARTMENT_OTHER): Payer: Medicare Other | Admitting: Hematology and Oncology

## 2018-08-18 ENCOUNTER — Inpatient Hospital Stay: Payer: Medicare Other

## 2018-08-18 ENCOUNTER — Other Ambulatory Visit: Payer: Self-pay

## 2018-08-18 ENCOUNTER — Inpatient Hospital Stay: Payer: Medicare Other | Attending: Hematology and Oncology

## 2018-08-18 ENCOUNTER — Encounter: Payer: Self-pay | Admitting: Hematology and Oncology

## 2018-08-18 VITALS — BP 128/74 | HR 61 | Temp 98.2°F | Resp 18 | Wt 223.1 lb

## 2018-08-18 DIAGNOSIS — Z87891 Personal history of nicotine dependence: Secondary | ICD-10-CM

## 2018-08-18 DIAGNOSIS — C9000 Multiple myeloma not having achieved remission: Secondary | ICD-10-CM | POA: Insufficient documentation

## 2018-08-18 DIAGNOSIS — M1990 Unspecified osteoarthritis, unspecified site: Secondary | ICD-10-CM

## 2018-08-18 DIAGNOSIS — I2510 Atherosclerotic heart disease of native coronary artery without angina pectoris: Secondary | ICD-10-CM

## 2018-08-18 DIAGNOSIS — I209 Angina pectoris, unspecified: Secondary | ICD-10-CM

## 2018-08-18 DIAGNOSIS — M129 Arthropathy, unspecified: Secondary | ICD-10-CM

## 2018-08-18 DIAGNOSIS — I509 Heart failure, unspecified: Secondary | ICD-10-CM

## 2018-08-18 DIAGNOSIS — R197 Diarrhea, unspecified: Secondary | ICD-10-CM

## 2018-08-18 DIAGNOSIS — I252 Old myocardial infarction: Secondary | ICD-10-CM

## 2018-08-18 DIAGNOSIS — E785 Hyperlipidemia, unspecified: Secondary | ICD-10-CM

## 2018-08-18 DIAGNOSIS — E119 Type 2 diabetes mellitus without complications: Secondary | ICD-10-CM

## 2018-08-18 DIAGNOSIS — Z79899 Other long term (current) drug therapy: Secondary | ICD-10-CM | POA: Insufficient documentation

## 2018-08-18 DIAGNOSIS — I10 Essential (primary) hypertension: Secondary | ICD-10-CM

## 2018-08-18 DIAGNOSIS — F418 Other specified anxiety disorders: Secondary | ICD-10-CM

## 2018-08-18 DIAGNOSIS — E538 Deficiency of other specified B group vitamins: Secondary | ICD-10-CM | POA: Insufficient documentation

## 2018-08-18 DIAGNOSIS — M549 Dorsalgia, unspecified: Secondary | ICD-10-CM

## 2018-08-18 DIAGNOSIS — K219 Gastro-esophageal reflux disease without esophagitis: Secondary | ICD-10-CM

## 2018-08-18 LAB — COMPREHENSIVE METABOLIC PANEL
ALT: 33 U/L (ref 0–44)
AST: 49 U/L — ABNORMAL HIGH (ref 15–41)
Albumin: 3.4 g/dL — ABNORMAL LOW (ref 3.5–5.0)
Alkaline Phosphatase: 74 U/L (ref 38–126)
Anion gap: 8 (ref 5–15)
BUN: 23 mg/dL (ref 8–23)
CO2: 22 mmol/L (ref 22–32)
Calcium: 9.9 mg/dL (ref 8.9–10.3)
Chloride: 103 mmol/L (ref 98–111)
Creatinine, Ser: 1.15 mg/dL (ref 0.61–1.24)
GFR calc Af Amer: >60 mL/min (ref >60)
GFR calc non Af Amer: >60 mL/min (ref >60)
Glucose, Bld: 228 mg/dL — ABNORMAL HIGH (ref 70–99)
Potassium: 4.8 mmol/L (ref 3.5–5.1)
Sodium: 133 mmol/L — ABNORMAL LOW (ref 135–145)
Total Bilirubin: 0.8 mg/dL (ref 0.3–1.2)
Total Protein: 7.7 g/dL (ref 6.5–8.1)

## 2018-08-18 LAB — CBC WITH DIFFERENTIAL/PLATELET
Abs Immature Granulocytes: 0 10*3/uL (ref 0.00–0.07)
Basophils Absolute: 0 10*3/uL (ref 0.0–0.1)
Basophils Relative: 1 %
Eosinophils Absolute: 0.2 10*3/uL (ref 0.0–0.5)
Eosinophils Relative: 4 %
HCT: 33 % — ABNORMAL LOW (ref 39.0–52.0)
Hemoglobin: 11.5 g/dL — ABNORMAL LOW (ref 13.0–17.0)
Immature Granulocytes: 0 %
Lymphocytes Relative: 30 %
Lymphs Abs: 1.1 10*3/uL (ref 0.7–4.0)
MCH: 36.7 pg — ABNORMAL HIGH (ref 26.0–34.0)
MCHC: 34.8 g/dL (ref 30.0–36.0)
MCV: 105.4 fL — ABNORMAL HIGH (ref 80.0–100.0)
Monocytes Absolute: 0.5 10*3/uL (ref 0.1–1.0)
Monocytes Relative: 12 %
Neutro Abs: 1.9 10*3/uL (ref 1.7–7.7)
Neutrophils Relative %: 53 %
Platelets: 132 10*3/uL — ABNORMAL LOW (ref 150–400)
RBC: 3.13 MIL/uL — ABNORMAL LOW (ref 4.22–5.81)
RDW: 12.7 % (ref 11.5–15.5)
WBC: 3.6 10*3/uL — ABNORMAL LOW (ref 4.0–10.5)
nRBC: 0 % (ref 0.0–0.2)

## 2018-08-18 LAB — MAGNESIUM: Magnesium: 1.7 mg/dL (ref 1.7–2.4)

## 2018-08-18 MED ORDER — CYANOCOBALAMIN 1000 MCG/ML IJ SOLN
1000.0000 ug | Freq: Once | INTRAMUSCULAR | Status: AC
  Administered 2018-08-18: 1000 ug via INTRAMUSCULAR
  Filled 2018-08-18: qty 1

## 2018-08-18 NOTE — Progress Notes (Signed)
Patient here for for follow up. Pt saw PCP this morning and he was told that WBC count was low and he was slightly anemic. He states "I just want to sleep all the time.

## 2018-08-18 NOTE — Progress Notes (Signed)
. [facility] day:  08/18/2018   Chief Complaint: Luis Sponaugle. is a 72 y.o. male with multiple myeloma who is seen for 6 week assessment on Revlimid.  HPI:  The patient was last seen in the medical oncology clinic on 07/07/2018.  At that time, he was doing well.  He was working out at [employer].  CBC revealed a hematocrit of 33.5, hemoglobin 11.7, platelets 144,000, WBC 3600 with an ANC of 2000.  Creatinine was 1.01.  Calcium was 10.2.  TSH was 3.090.  M spike was 0.  He received B12 on 07/07/2018.  During the interim, patient has been doing "pretty good". He complains of polyarthralgia related to his osteoarthritis. Pain in his knees is attributed to spinal stenosis. He notes that he is receiving "injections" in his knees, which helps. Patient is fatigued. He states, "I just want to sleep all of the time".   Patient completed last Revlimid cycle on 07/27/2018  Patient denies that he has experienced any B symptoms. He denies any interval infections. Patient advises that he maintains an adequate appetite. He is eating well. Weight today is 223 lb 1.6 oz (101.2 kg), which compared to his last visit to the clinic, represents a stable weight. Patient continues to have fluctuating blood sugars.  Patient complains of pain rated 4/10 in the clinic today.   Past Medical History:  Diagnosis Date  . Angina pectoris ([place])   . Anxiety   . Arthritis   . CHF (congestive heart failure) ([place])   . Coronary artery disease   . Depression   . Diabetes mellitus without complication [facility])    Patient takes Metformin.  . Diverticulosis   . GERD (gastroesophageal reflux disease)   . Hyperlipidemia   . Hypertension   . Multiple myeloma ([place])   . Multiple myeloma ([place]) 03/27/2015  . Myocardial infarction [facility]) April 2001   widowmaker  . Shortness of breath dyspnea   . Sleep apnea    No CPAP @ present  . Spinal stenosis     Past Surgical History:   Procedure Laterality Date  . CARDIAC CATHETERIZATION    . CARPAL TUNNEL RELEASE    . CATARACT EXTRACTION    . COLONOSCOPY WITH PROPOFOL N/A 11/11/2015   Procedure: COLONOSCOPY WITH PROPOFOL;  Surgeon: Lollie Sails, MD;  Location: [facility] ENDOSCOPY;  Service: Endoscopy;  Laterality: N/A;  . CORONARY ARTERY BYPASS GRAFT    . EYE SURGERY Bilateral    Cataract Extraction  . INGUINAL HERNIA REPAIR    . JOINT REPLACEMENT Right 2008   Right Total Hip Replacement  . PILONIDAL CYST EXCISION    . ROTATOR CUFF REPAIR    . TOTAL HIP ARTHROPLASTY Right   . VENTRAL HERNIA REPAIR N/A 08/15/2015   Procedure: VENTRAL HERNIA REPAIR WITH MESH ;  Surgeon: Leonie Green, MD;  Location: [facility] ORS;  Service: General;  Laterality: N/A;    Family History  Problem Relation Age of Onset  . Heart disease Father   . Stroke Mother   . Prostate cancer Maternal Grandfather 66    Social History:  reports that he quit smoking about 18 years ago. His smoking use included cigarettes. He has a 60.00 pack-year smoking history. He quit smokeless tobacco use about 18 years ago. He reports that he does not drink alcohol or use drugs.  He notes that his wife goes off to work.  He states that his wife would like to  retire, but that would affect his medication coverage.  He has projects that he likes to do at home.  He does wood working.  His 42nd high school graduation anniversary was in 06/2016.  He has a new grand-daughter, [name].  His 50th anniversary is coming up.  The patient is accompanied by his wife, Luis [name], today.  Allergies:  Allergies  Allergen Reactions  . Pravachol [Pravastatin]   . Pravastatin Sodium Other (See Comments)  . Statins Other (See Comments)    Muscle aches  . Zocor [Simvastatin] Other (See Comments)    Muscle aches    Current Medications: Current Outpatient Medications  Medication Sig Dispense Refill  . acetaminophen (TYLENOL) 500 MG tablet Take 500 mg by mouth every 6 (six) hours  as needed.    . ALPRAZolam (XANAX) 0.25 MG tablet Take 0.25 mg by mouth at bedtime as needed. for sleep  3  . amoxicillin (AMOXIL) 500 MG capsule TAKE 4 CAPSULES 1 HOUR PRIOR TO DENTAL APPT.    Marland Kitchen aspirin 81 MG tablet Take 81 mg by mouth daily.     [name] Pollen 500 MG CHEW Chew 1 tablet by mouth daily.    . Calcium Carbonate-Vitamin D 600-400 MG-UNIT chew tablet Chew 2 tablets by mouth daily. Takes them throughout the day for a total of 4     . Cyanocobalamin (VITAMIN B-12 IJ) Inject 1 Dose as directed every 30 (thirty) days.     . ergocalciferol (VITAMIN D2) 50000 UNITS capsule Take 50,000 Units by mouth once a week.    Marland Kitchen lisinopril (PRINIVIL,ZESTRIL) 20 MG tablet Take 1 tablet by mouth daily.    Marland Kitchen lovastatin (MEVACOR) 40 MG tablet Take 20 mg by mouth at bedtime. 2 tablets a day    . metFORMIN (GLUCOPHAGE) 500 MG tablet 500 mg 2 (two) times daily with a meal.     . omeprazole (PRILOSEC) 20 MG capsule Take 20 mg by mouth daily.    . traMADol (ULTRAM) 50 MG tablet TAKE 1 TABLET BY MOUTH 3 TIMES A DAY AS NEEDED 60 tablet 0  . isosorbide mononitrate (IMDUR) 30 MG 24 hr tablet Take 30 mg by mouth daily.    Marland Kitchen KLOR-CON M20 20 MEQ tablet TAKE 1 TABLET (20 MEQ TOTAL) BY MOUTH DAILY. (Patient not taking: Reported on 07/07/2018) 30 tablet 2  . REVLIMID 2.5 MG capsule TAKE 1 CAPSULE BY MOUTH ONCE DAILY FOR 21 DAYS ON AND THEN 7 DAYS OFF (Patient not taking: Reported on 08/18/2018) 21 capsule 0  . Zoledronic Acid (ZOMETA) 4 MG/100ML IVPB Inject into the vein.     No current facility-administered medications for this visit.     Review of Systems  Constitutional: Positive for malaise/fatigue (severe. (+) EDS). Negative for chills, diaphoresis, fever and weight loss (stable).       Feels "pretty good".  Tired lately.  HENT: Negative.  Negative for congestion, ear discharge, ear pain, nosebleeds, sinus pain and sore throat.   Eyes: Negative.  Negative for blurred vision, double vision, photophobia, pain,  discharge and redness.  Respiratory: Positive for shortness of breath (exertional). Negative for cough, hemoptysis and sputum production.        Sleep apnea.  Cardiovascular: Negative.  Negative for chest pain, palpitations, orthopnea, leg swelling and PND.  Gastrointestinal: Positive for diarrhea (intermittent). Negative for abdominal pain, blood in stool, constipation, nausea and vomiting.  Genitourinary: Negative.  Negative for dysuria, frequency, hematuria and urgency.  Musculoskeletal: Positive for back pain (known spinal stenosis) and  joint pain (knee arthritis). Negative for myalgias and neck pain.  Skin: Negative.  Negative for itching and rash.  Neurological: Positive for weakness (Requires a cane for balance ). Negative for dizziness, tremors, sensory change, speech change, focal weakness and headaches.  Endo/Heme/Allergies: Bruises/bleeds easily.       Diabetes.  Psychiatric/Behavioral: Negative for depression and memory loss. The patient is not nervous/anxious and does not have insomnia.   All other systems reviewed and are negative.  Performance status (ECOG): 1  Vital Signs BP 128/74 (BP Location: Right Arm)   Pulse 61   Temp 98.2 F (36.8 C) (Tympanic)   Resp 18   Wt 223 lb 1.6 oz (101.2 kg)   SpO2 96% Comment: room air  BMI 34.94 kg/m   Physical Exam  Constitutional: He is oriented to person, place, and time and well-developed, well-nourished, and in no distress. No distress.  HENT:  Head: Normocephalic and atraumatic.  Mouth/Throat: No oropharyngeal exudate.  Short white hair.  Eyes: Pupils are equal, round, and reactive to light. Conjunctivae and EOM are normal. No scleral icterus.  Blue eyes.  Neck: Normal range of motion. Neck supple. No JVD present.  Cardiovascular: Regular rhythm and normal heart sounds. Exam reveals no gallop and no friction rub.  No murmur heard. Pulmonary/Chest: Effort normal and breath sounds normal. No respiratory distress. He has no  wheezes. He has no rales.  Abdominal: Soft. Bowel sounds are normal. He exhibits no distension and no mass. There is no tenderness. There is no rebound and no guarding.  Musculoskeletal: Normal range of motion. He exhibits no edema or tenderness.  Lymphadenopathy:    He has no cervical adenopathy.    He has no axillary adenopathy.       Right: No supraclavicular adenopathy present.       Left: No supraclavicular adenopathy present.  Neurological: He is alert and oriented to person, place, and time.  Skin: Skin is warm and dry. No rash noted. He is not diaphoretic. No erythema.  Psychiatric: Mood, affect and judgment normal.  Nursing note and vitals reviewed.   Orders Only on 08/18/2018  Component Date Value Ref Range Status  . Magnesium 08/18/2018 1.7  1.7 - 2.4 mg/dL Final   Performed at [facility], [address], [place], [place] [address]  . Sodium 08/18/2018 133* 135 - 145 mmol/L Final  . Potassium 08/18/2018 4.8  3.5 - 5.1 mmol/L Final  . Chloride 08/18/2018 103  98 - 111 mmol/L Final  . CO2 08/18/2018 22  22 - 32 mmol/L Final  . Glucose, Bld 08/18/2018 228* 70 - 99 mg/dL Final  . BUN 08/18/2018 23  8 - 23 mg/dL Final  . Creatinine, Ser 08/18/2018 1.15  0.61 - 1.24 mg/dL Final  . Calcium 08/18/2018 9.9  8.9 - 10.3 mg/dL Final  . Total Protein 08/18/2018 7.7  6.5 - 8.1 g/dL Final  . Albumin 08/18/2018 3.4* 3.5 - 5.0 g/dL Final  . AST 08/18/2018 49* 15 - 41 U/L Final  . ALT 08/18/2018 33  0 - 44 U/L Final  . Alkaline Phosphatase 08/18/2018 74  38 - 126 U/L Final  . Total Bilirubin 08/18/2018 0.8  0.3 - 1.2 mg/dL Final  . GFR calc non Af Amer 08/18/2018 >60  >60 mL/min Final  . GFR calc Af Amer 08/18/2018 >60  >60 mL/min Final   Comment: (NOTE) The eGFR has been calculated using the CKD EPI equation. This calculation has not been validated in all clinical situations.  eGFR's persistently <60 mL/min signify possible Chronic Kidney Disease.   Georgiann Hahn gap 08/18/2018 8   5 - 15 Final   Performed at [facility], [address] [place], [place] [address]  . WBC 08/18/2018 3.6* 4.0 - 10.5 K/uL Final  . RBC 08/18/2018 3.13* 4.22 - 5.81 MIL/uL Final  . Hemoglobin 08/18/2018 11.5* 13.0 - 17.0 g/dL Final  . HCT 08/18/2018 33.0* 39.0 - 52.0 % Final  . MCV 08/18/2018 105.4* 80.0 - 100.0 fL Final  . MCH 08/18/2018 36.7* 26.0 - 34.0 pg Final  . MCHC 08/18/2018 34.8  30.0 - 36.0 g/dL Final  . RDW 08/18/2018 12.7  11.5 - 15.5 % Final  . Platelets 08/18/2018 132* 150 - 400 K/uL Final  . nRBC 08/18/2018 0.0  0.0 - 0.2 % Final  . Neutrophils Relative % 08/18/2018 53  % Final  . Neutro Abs 08/18/2018 1.9  1.7 - 7.7 K/uL Final  . Lymphocytes Relative 08/18/2018 30  % Final  . Lymphs Abs 08/18/2018 1.1  0.7 - 4.0 K/uL Final  . Monocytes Relative 08/18/2018 12  % Final  . Monocytes Absolute 08/18/2018 0.5  0.1 - 1.0 K/uL Final  . Eosinophils Relative 08/18/2018 4  % Final  . Eosinophils Absolute 08/18/2018 0.2  0.0 - 0.5 K/uL Final  . Basophils Relative 08/18/2018 1  % Final  . Basophils Absolute 08/18/2018 0.0  0.0 - 0.1 K/uL Final  . Immature Granulocytes 08/18/2018 0  % Final  . Abs Immature Granulocytes 08/18/2018 0.00  0.00 - 0.07 K/uL Final   Performed at [name], [address], [place], [place] [address]    Assessment:  Luis [name]. is a 72 y.o. male with stage III multiple myeloma. He presented in 02/2013 with left-sided sharp pain. Evaluation revealed wide-spread lytic lesions including fracture of the left 5th rib laterally. CT scans on 02/22/2013 showed innumerable lytic lesions in thoracic spine, sternum, clavicle, scapula, and ribs.   Bone marrow biopsy on 03/13/2013 revealed 15 to 20% plasma cells. Iron stores were absent. Renal function was normal. SPEP on 03/01/2013 revealed a 1.4 gm/dL IgG monoclonal lambda. IgG was 1987.   He began induction with Revlimid 25 mg a day (3 weeks on and 1 week off) and Decadron (40 mg once a  week) on 04/09/2013. SPEP has revealed no monoclonal protein since 07/26/2014 (last check 08/18/2018). IgG was 1362 on 07/26/2014 and 1097 on 12/19/2014.   Lambda free light chains have been monitored: 33.20 (ratio of 1.56) on 09/20/2014, 31.51 (ratio of 1.43) on 12/19/2014, 30.25 (ratio of 1.56) on 05/01/2015, 31.95 (ratio 1.49) on 01/01/2016, 28.02 (ratio 1.29) on 03/25/2016, 31.5 (ratio 1.26) on 05/28/2016, 21.8 (ratio 1.35) on 08/20/2016, 37.3 (ratio 1.09) on 10/22/2016, 46.3 (ratio 1.49) on 10/21/2017, and 54 (ratio 1.84) on 04/14/2018.  24 hour UPEP on 12/02/2015 revealed no monoclonal protein.  With remission, Revlimid was decreased to 10 mg a day then to 10 mg every other day secondary to issues with renal function and diarrhea.  Current Revlimid is 2.5 mg a day (3 weeks on/1 week off).  Last cycle of Revlimid ended on 05/04/2018.  Bone survey on 05/30/2015 revealed the majority of small lytic lesions were stable.  There was possibly new lesion in the distal left clavicle and right mid femur (small) and a possible developing lucency in the proximal left femoral shaft.  The calvarial lesion noted on the prior study was not well seen (? positional).  Bone survey on 11/27/2015  revealed widespread bony lytic lesions consistent with multiple myeloma.  The vast majority of lesions were stable.  A few small lesions were not previously seen.  One lesion was previously obscured by bowel contents.  Two small lesions in the right distal femoral diaphysis appeared new.  Bone survey on 05/25/2016 revealed no evidence of progressive myelomatous involvement of the skeleton.  Bone survey on 05/16/2017 revealed multiple lucent lesions as previously seen. There was no convincing evidence of progression or superimposed abnormality since prior study.  Bone survey on 06/01/2018 revealed scattered lucent lesions, with no evidence for progression.   He received Zometa every 3 months (last 02/28/2017). He takes  calcium 4 pills/day. He receives B12 every 6 weeks (last 07/07/2018). B12 was 370 on 01/17/2015 and 365 on 03/03/2018.  Folate was 17.5 on 09/17/2016 and 33 on 03/03/2018.  TSH was normal on 07/07/2018.  Abdominal and pelvic CT scan on 07/15/2015 revealed  extensive sigmoid diverticulosis. There was questionable wall thickening of the ascending colon versus artifact from incomplete distention.  Colonoscopy on 11/11/2015 revealed diverticulosis in the sigmoid colon and distal descending colon and ascending colon.  There was a 2 mm polyp in the mid sigmoid colon.  Pathology revealed a hyperplastic polyp, negative for dysplasia or malignancy.  Symptomatically, he feels "pretty good".  Exam is stable.  Hemoglobin is 11.5.  WBC is 3600 ([place] 1900).  Plan: 1.  Labs today:  CBC with diff, CMP, SPEP. 2.  Multiple myeloma:   Clinically doing well.  M-spike is 0.  Next cycle of Revlimid starts today. 3.  B12 deficiency:  B12 today and every 6 weeks. 4.  RTC in 6 weeks for MD assessment, labs (CBC with diff, CMP, Mg, SPEP), and B12 injection.    Honor Loh, NP 08/18/2018, 1:46 PM   I saw and evaluated the patient, participating in the key portions of the service and reviewing pertinent diagnostic studies and records.  I reviewed the nurse practitioner's note and agree with the findings and the plan.  The assessment and plan were discussed with the patient.  A few questions were asked by the patient and answered.   Nolon Stalls, MD 08/18/2018, 1:46 PM

## 2018-08-21 LAB — PROTEIN ELECTROPHORESIS, SERUM
A/G Ratio: 0.8 (ref 0.7–1.7)
Albumin ELP: 3.2 g/dL (ref 2.9–4.4)
Alpha-1-Globulin: 0.2 g/dL (ref 0.0–0.4)
Alpha-2-Globulin: 0.8 g/dL (ref 0.4–1.0)
Beta Globulin: 1.6 g/dL — ABNORMAL HIGH (ref 0.7–1.3)
Gamma Globulin: 1.4 g/dL (ref 0.4–1.8)
Globulin, Total: 4.1 g/dL — ABNORMAL HIGH (ref 2.2–3.9)
Total Protein ELP: 7.3 g/dL (ref 6.0–8.5)

## 2018-08-31 ENCOUNTER — Other Ambulatory Visit: Payer: Self-pay | Admitting: Hematology and Oncology

## 2018-08-31 NOTE — Telephone Encounter (Signed)
  Are we filling this?  M 

## 2018-08-31 NOTE — Telephone Encounter (Signed)
Yes it was last filled Dec 2018 by you

## 2018-09-04 ENCOUNTER — Other Ambulatory Visit: Payer: Self-pay | Admitting: Urgent Care

## 2018-09-04 DIAGNOSIS — C9000 Multiple myeloma not having achieved remission: Secondary | ICD-10-CM

## 2018-09-29 ENCOUNTER — Telehealth: Payer: Self-pay | Admitting: *Deleted

## 2018-09-29 ENCOUNTER — Inpatient Hospital Stay: Payer: Medicare Other

## 2018-09-29 ENCOUNTER — Inpatient Hospital Stay (HOSPITAL_BASED_OUTPATIENT_CLINIC_OR_DEPARTMENT_OTHER): Payer: Medicare Other | Admitting: Hematology and Oncology

## 2018-09-29 ENCOUNTER — Inpatient Hospital Stay: Payer: Medicare Other | Attending: Hematology and Oncology

## 2018-09-29 ENCOUNTER — Ambulatory Visit: Payer: PRIVATE HEALTH INSURANCE

## 2018-09-29 VITALS — BP 148/68 | HR 57 | Temp 98.2°F | Resp 18 | Wt 222.3 lb

## 2018-09-29 DIAGNOSIS — M129 Arthropathy, unspecified: Secondary | ICD-10-CM | POA: Diagnosis not present

## 2018-09-29 DIAGNOSIS — R197 Diarrhea, unspecified: Secondary | ICD-10-CM | POA: Insufficient documentation

## 2018-09-29 DIAGNOSIS — I209 Angina pectoris, unspecified: Secondary | ICD-10-CM | POA: Insufficient documentation

## 2018-09-29 DIAGNOSIS — I10 Essential (primary) hypertension: Secondary | ICD-10-CM | POA: Diagnosis not present

## 2018-09-29 DIAGNOSIS — F418 Other specified anxiety disorders: Secondary | ICD-10-CM | POA: Insufficient documentation

## 2018-09-29 DIAGNOSIS — I509 Heart failure, unspecified: Secondary | ICD-10-CM | POA: Insufficient documentation

## 2018-09-29 DIAGNOSIS — Z79899 Other long term (current) drug therapy: Secondary | ICD-10-CM | POA: Diagnosis not present

## 2018-09-29 DIAGNOSIS — K219 Gastro-esophageal reflux disease without esophagitis: Secondary | ICD-10-CM | POA: Insufficient documentation

## 2018-09-29 DIAGNOSIS — C9000 Multiple myeloma not having achieved remission: Secondary | ICD-10-CM | POA: Diagnosis present

## 2018-09-29 DIAGNOSIS — Z87891 Personal history of nicotine dependence: Secondary | ICD-10-CM | POA: Insufficient documentation

## 2018-09-29 DIAGNOSIS — I252 Old myocardial infarction: Secondary | ICD-10-CM | POA: Insufficient documentation

## 2018-09-29 DIAGNOSIS — E785 Hyperlipidemia, unspecified: Secondary | ICD-10-CM | POA: Insufficient documentation

## 2018-09-29 DIAGNOSIS — E119 Type 2 diabetes mellitus without complications: Secondary | ICD-10-CM | POA: Diagnosis not present

## 2018-09-29 DIAGNOSIS — M1990 Unspecified osteoarthritis, unspecified site: Secondary | ICD-10-CM | POA: Diagnosis not present

## 2018-09-29 DIAGNOSIS — E538 Deficiency of other specified B group vitamins: Secondary | ICD-10-CM

## 2018-09-29 DIAGNOSIS — M549 Dorsalgia, unspecified: Secondary | ICD-10-CM | POA: Insufficient documentation

## 2018-09-29 DIAGNOSIS — I2510 Atherosclerotic heart disease of native coronary artery without angina pectoris: Secondary | ICD-10-CM | POA: Diagnosis not present

## 2018-09-29 LAB — CBC WITH DIFFERENTIAL/PLATELET
Abs Immature Granulocytes: 0.01 10*3/uL (ref 0.00–0.07)
Basophils Absolute: 0.1 10*3/uL (ref 0.0–0.1)
Basophils Relative: 2 %
Eosinophils Absolute: 0.3 10*3/uL (ref 0.0–0.5)
Eosinophils Relative: 8 %
HCT: 32.8 % — ABNORMAL LOW (ref 39.0–52.0)
Hemoglobin: 11.1 g/dL — ABNORMAL LOW (ref 13.0–17.0)
Immature Granulocytes: 0 %
Lymphocytes Relative: 30 %
Lymphs Abs: 1 10*3/uL (ref 0.7–4.0)
MCH: 35.9 pg — ABNORMAL HIGH (ref 26.0–34.0)
MCHC: 33.8 g/dL (ref 30.0–36.0)
MCV: 106.1 fL — ABNORMAL HIGH (ref 80.0–100.0)
Monocytes Absolute: 0.4 10*3/uL (ref 0.1–1.0)
Monocytes Relative: 12 %
Neutro Abs: 1.7 10*3/uL (ref 1.7–7.7)
Neutrophils Relative %: 48 %
Platelets: 120 10*3/uL — ABNORMAL LOW (ref 150–400)
RBC: 3.09 MIL/uL — ABNORMAL LOW (ref 4.22–5.81)
RDW: 13.1 % (ref 11.5–15.5)
WBC: 3.4 10*3/uL — ABNORMAL LOW (ref 4.0–10.5)
nRBC: 0 % (ref 0.0–0.2)

## 2018-09-29 LAB — MAGNESIUM: Magnesium: 1.8 mg/dL (ref 1.7–2.4)

## 2018-09-29 LAB — COMPREHENSIVE METABOLIC PANEL
ALT: 30 U/L (ref 0–44)
AST: 48 U/L — ABNORMAL HIGH (ref 15–41)
Albumin: 3.5 g/dL (ref 3.5–5.0)
Alkaline Phosphatase: 78 U/L (ref 38–126)
Anion gap: 5 (ref 5–15)
BUN: 20 mg/dL (ref 8–23)
CO2: 22 mmol/L (ref 22–32)
Calcium: 9.7 mg/dL (ref 8.9–10.3)
Chloride: 104 mmol/L (ref 98–111)
Creatinine, Ser: 1.03 mg/dL (ref 0.61–1.24)
GFR calc Af Amer: >60 mL/min (ref >60)
GFR calc non Af Amer: >60 mL/min (ref >60)
Glucose, Bld: 256 mg/dL — ABNORMAL HIGH (ref 70–99)
Potassium: 4.7 mmol/L (ref 3.5–5.1)
Sodium: 131 mmol/L — ABNORMAL LOW (ref 135–145)
Total Bilirubin: 0.8 mg/dL (ref 0.3–1.2)
Total Protein: 8 g/dL (ref 6.5–8.1)

## 2018-09-29 MED ORDER — CYANOCOBALAMIN 1000 MCG/ML IJ SOLN
1000.0000 ug | Freq: Once | INTRAMUSCULAR | Status: AC
  Administered 2018-09-29: 1000 ug via INTRAMUSCULAR
  Filled 2018-09-29: qty 1

## 2018-09-29 NOTE — Progress Notes (Signed)
Patient offers no complaints today.  Wife with patient today.

## 2018-09-29 NOTE — Progress Notes (Signed)
. [facility]-  Cancer Center  Clinic day:  09/29/2018   Chief Complaint: Luis A Eakle Jr. is a 72 y.o. male with multiple myeloma who is seen for 6 week assessment on Revlimid.  HPI:  The patient was last seen in the medical oncology clinic on 08/18/2018.  At that time, he was doing "pretty good".  He described fatigue and joint pain.  Hematocrit was 33.0, hemoglobin 11.5, platelets 132,000, WBC 3600 with an ANC of 1900.  Creatinine was 1.15.  M-spike was 0.  He received B12 on 08/18/2018.  During the interim, he has felt pretty good.  He completed his last cycle of Revlimid on 09/06/2018.   Past Medical History:  Diagnosis Date  . Angina pectoris (HCC)   . Anxiety   . Arthritis   . CHF (congestive heart failure) (HCC)   . Coronary artery disease   . Depression   . Diabetes mellitus without complication (HCC)    Patient takes Metformin.  . Diverticulosis   . GERD (gastroesophageal reflux disease)   . Hyperlipidemia   . Hypertension   . Multiple myeloma (HCC)   . Multiple myeloma (HCC) 03/27/2015  . Myocardial infarction (HCC) April 2001   widowmaker  . Shortness of breath dyspnea   . Sleep apnea    No CPAP @ present  . Spinal stenosis     Past Surgical History:  Procedure Laterality Date  . CARDIAC CATHETERIZATION    . CARPAL TUNNEL RELEASE    . CATARACT EXTRACTION    . COLONOSCOPY WITH PROPOFOL N/A 11/11/2015   Procedure: COLONOSCOPY WITH PROPOFOL;  Surgeon: Martin U Skulskie, MD;  Location: [facility] ENDOSCOPY;  Service: Endoscopy;  Laterality: N/A;  . CORONARY ARTERY BYPASS GRAFT    . EYE SURGERY Bilateral    Cataract Extraction  . INGUINAL HERNIA REPAIR    . JOINT REPLACEMENT Right 2008   Right Total Hip Replacement  . PILONIDAL CYST EXCISION    . ROTATOR CUFF REPAIR    . TOTAL HIP ARTHROPLASTY Right   . VENTRAL HERNIA REPAIR N/A 08/15/2015   Procedure: VENTRAL HERNIA REPAIR WITH MESH ;  Surgeon: Jarvis Wilton Smith, MD;  Location: [facility] ORS;   Service: General;  Laterality: N/A;    Family History  Problem Relation Age of Onset  . Heart disease Father   . Stroke Mother   . Prostate cancer Maternal Grandfather 90    Social History:  reports that he quit smoking about 18 years ago. His smoking use included cigarettes. He has a 60.00 pack-year smoking history. He quit smokeless tobacco use about 18 years ago. He reports that he does not drink alcohol or use drugs.  He notes that his wife goes off to work.  He states that his wife would like to retire, but that would affect his medication coverage.  He has projects that he likes to do at home.  He does wood working.  His 42nd high school graduation anniversary was in 06/2016.  He has a new grand-daughter, Luis [name].  His 50th anniversary is coming up.  The patient is accompanied by his wife, [name], today.  Allergies:  Allergies  Allergen Reactions  . Pravachol [Pravastatin]   . Pravastatin Sodium Other (See Comments)  . Statins Other (See Comments)    Muscle aches  . Zocor [Simvastatin] Other (See Comments)    Muscle aches    Current Medications: Current Outpatient Medications  Medication Sig Dispense Refill  . acetaminophen (TYLENOL) 500 MG tablet Take   500 mg by mouth every 6 (six) hours as needed.    . ALPRAZolam (XANAX) 0.25 MG tablet Take 0.25 mg by mouth at bedtime as needed. for sleep  3  . amoxicillin (AMOXIL) 500 MG capsule TAKE 4 CAPSULES 1 HOUR PRIOR TO DENTAL APPT.    Marland Kitchen aspirin 81 MG tablet Take 81 mg by mouth daily.     [name] Pollen 500 MG CHEW Chew 1 tablet by mouth daily.    . Calcium Carbonate-Vitamin D 600-400 MG-UNIT chew tablet Chew 2 tablets by mouth daily. Takes them throughout the day for a total of 4     . Cyanocobalamin (VITAMIN B-12 IJ) Inject 1 Dose as directed every 30 (thirty) days.     . ergocalciferol (VITAMIN D2) 50000 UNITS capsule Take 50,000 Units by mouth once a week.    . isosorbide mononitrate (IMDUR) 30 MG 24 hr tablet Take 30 mg by mouth  daily.    Marland Kitchen lisinopril (PRINIVIL,ZESTRIL) 20 MG tablet Take 1 tablet by mouth daily.    Marland Kitchen lovastatin (MEVACOR) 40 MG tablet Take 20 mg by mouth at bedtime. 2 tablets a day    . metFORMIN (GLUCOPHAGE) 500 MG tablet 500 mg 2 (two) times daily with a meal.     . omeprazole (PRILOSEC) 20 MG capsule Take 20 mg by mouth daily.    . traMADol (ULTRAM) 50 MG tablet TAKE 1 TABLET BY MOUTH THREE TIMES A DAY AS NEEDED 60 tablet 0  . Zoledronic Acid (ZOMETA) 4 MG/100ML IVPB Inject into the vein.    Marland Kitchen KLOR-CON M20 20 MEQ tablet TAKE 1 TABLET (20 MEQ TOTAL) BY MOUTH DAILY. (Patient not taking: Reported on 07/07/2018) 30 tablet 2  . lenalidomide (REVLIMID) 2.5 MG capsule TAKE 1 CAPSULE BY MOUTH ONCE DAILY FOR 21 DAYS ON AND THEN 7 DAYS OFF 21 capsule 0   No current facility-administered medications for this visit.     Review of Systems  Constitutional: Positive for weight loss (1 pound). Negative for chills, diaphoresis, fever and malaise/fatigue.       Feels "pretty good".  HENT: Negative.  Negative for congestion, nosebleeds and sinus pain.   Eyes: Negative.  Negative for blurred vision, double vision and photophobia.  Respiratory: Positive for shortness of breath (exertional). Negative for cough, hemoptysis and sputum production.        Sleep apnea syndrome on recent PSG.  Cardiovascular: Negative.  Negative for chest pain, palpitations, orthopnea, leg swelling and PND.       Bradycardia.  Gastrointestinal: Negative.  Negative for abdominal pain, blood in stool, constipation, diarrhea, melena, nausea and vomiting.  Genitourinary: Negative.  Negative for dysuria, frequency, hematuria and urgency.  Musculoskeletal: Positive for back pain (spinal stenosis). Negative for falls, joint pain, myalgias and neck pain.  Skin: Negative.  Negative for itching and rash.  Neurological: Positive for weakness (requires a cane for balance ). Negative for dizziness, tremors, focal weakness and headaches.   Endo/Heme/Allergies: Bruises/bleeds easily.       Diabetes.  Psychiatric/Behavioral: Negative for depression, memory loss and suicidal ideas. The patient is not nervous/anxious and does not have insomnia.   All other systems reviewed and are negative.  Performance status (ECOG): 1 - Symptomatic but completely ambulatory  Vital Signs BP (!) 148/68 (BP Location: Left Arm, Patient Position: Sitting)   Pulse (!) 57   Temp 98.2 F (36.8 C) (Tympanic)   Resp 18   Wt 222 lb 5 oz (100.8 kg)   BMI 34.82 kg/m  Physical Exam  Constitutional: He is oriented to person, place, and time and well-developed, well-nourished, and in no distress. No distress.  HENT:  Head: Normocephalic and atraumatic.  Mouth/Throat: Oropharynx is clear and moist. No oropharyngeal exudate.  Short white hair.  Eyes: Pupils are equal, round, and reactive to light. Conjunctivae and EOM are normal. No scleral icterus.  Blue eyes.  Neck: Normal range of motion. Neck supple. No JVD present.  Cardiovascular: Regular rhythm and normal heart sounds. Bradycardia present. Exam reveals no gallop and no friction rub.  No murmur heard. Pulmonary/Chest: Effort normal and breath sounds normal. No respiratory distress. He has no wheezes. He has no rales.  Abdominal: Soft. Bowel sounds are normal. He exhibits no distension and no mass. There is no abdominal tenderness. There is no rebound and no guarding.  Musculoskeletal: Normal range of motion.        General: No tenderness or edema.  Lymphadenopathy:    He has no cervical adenopathy.    He has no axillary adenopathy.       Right: No supraclavicular adenopathy present.       Left: No supraclavicular adenopathy present.  Neurological: He is alert and oriented to person, place, and time. Gait normal.  Skin: Skin is warm and dry. No rash noted. He is not diaphoretic. No erythema.  Psychiatric: Mood, affect and judgment normal.  Nursing note and vitals reviewed.   Appointment  on 09/29/2018  Component Date Value Ref Range Status  . Total Protein ELP 09/29/2018 7.6  6.0 - 8.5 g/dL Final  . Albumin ELP 09/29/2018 3.5  2.9 - 4.4 g/dL Final  . Alpha-1-Globulin 09/29/2018 0.2  0.0 - 0.4 g/dL Final  . Alpha-2-Globulin 09/29/2018 0.7  0.4 - 1.0 g/dL Final  . Beta Globulin 09/29/2018 1.5* 0.7 - 1.3 g/dL Final  . Gamma Globulin 09/29/2018 1.7  0.4 - 1.8 g/dL Final  . M-Spike, % 09/29/2018 Not Observed  Not Observed g/dL Final  . SPE Interp. 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 The bridging is due to increases of IgA and IgG which tend to converge in this region. Beta- gamma bridging has also been observed in some malignancies and inflammatory disease, however, in these cases, there is also elevation of the alpha-1 and alpha-2 globulins. Performed At: [facility] [address] [place], [place] [id number] Nagendra Sanjai MD Ph:[phone]   . Comment 09/29/2018 Comment   Final   Comment: (NOTE) Protein electrophoresis scan will follow via computer, mail, or courier delivery.   . GLOBULIN, TOTAL 09/29/2018 4.1* 2.2 - 3.9 g/dL Corrected  . A/G Ratio 09/29/2018 0.9  0.7 - 1.7 Corrected  . Magnesium 09/29/2018 1.8  1.7 - 2.4 mg/dL Final   Performed at [facility], [address] [place], [place] [address]  . Sodium 09/29/2018 131* 135 - 145 mmol/L Final  . Potassium 09/29/2018 4.7  3.5 - 5.1 mmol/L Final  . Chloride 09/29/2018 104  98 - 111 mmol/L Final  . CO2 09/29/2018 22  22 - 32 mmol/L Final  . Glucose, Bld 09/29/2018 256* 70 - 99 mg/dL Final  . BUN 09/29/2018 20  8 - 23 mg/dL Final  . Creatinine, Ser 09/29/2018 1.03  0.61 - 1.24 mg/dL Final  . Calcium 09/29/2018 9.7  8.9 - 10.3 mg/dL Final  . Total Protein 09/29/2018 8.0  6.5 - 8.1 g/dL Final  . Albumin 09/29/2018 3.5    3.5 - 5.0 g/dL Final  . AST 09/29/2018 48* 15 - 41 U/L Final  .  ALT 09/29/2018 30  0 - 44 U/L Final  . Alkaline Phosphatase 09/29/2018 78  38 - 126 U/L Final  . Total Bilirubin 09/29/2018 0.8  0.3 - 1.2 mg/dL Final  . GFR calc non Af Amer 09/29/2018 >60  >60 mL/min Final  . GFR calc Af Amer 09/29/2018 >60  >60 mL/min Final   Comment: (NOTE) The eGFR has been calculated using the CKD EPI equation. This calculation has not been validated in all clinical situations. eGFR's persistently <60 mL/min signify possible Chronic Kidney Disease.   Georgiann Hahn gap 09/29/2018 5  5 - 15 Final   Performed at [facility], [address] [place], [place] [address]  . WBC 09/29/2018 3.4* 4.0 - 10.5 K/uL Final  . RBC 09/29/2018 3.09* 4.22 - 5.81 MIL/uL Final  . Hemoglobin 09/29/2018 11.1* 13.0 - 17.0 g/dL Final  . HCT 09/29/2018 32.8* 39.0 - 52.0 % Final  . MCV 09/29/2018 106.1* 80.0 - 100.0 fL Final  . MCH 09/29/2018 35.9* 26.0 - 34.0 pg Final  . MCHC 09/29/2018 33.8  30.0 - 36.0 g/dL Final  . RDW 09/29/2018 13.1  11.5 - 15.5 % Final  . Platelets 09/29/2018 120* 150 - 400 K/uL Final  . nRBC 09/29/2018 0.0  0.0 - 0.2 % Final  . Neutrophils Relative % 09/29/2018 48  % Final  . Neutro Abs 09/29/2018 1.7  1.7 - 7.7 K/uL Final  . Lymphocytes Relative 09/29/2018 30  % Final  . Lymphs Abs 09/29/2018 1.0  0.7 - 4.0 K/uL Final  . Monocytes Relative 09/29/2018 12  % Final  . Monocytes Absolute 09/29/2018 0.4  0.1 - 1.0 K/uL Final  . Eosinophils Relative 09/29/2018 8  % Final  . Eosinophils Absolute 09/29/2018 0.3  0.0 - 0.5 K/uL Final  . Basophils Relative 09/29/2018 2  % Final  . Basophils Absolute 09/29/2018 0.1  0.0 - 0.1 K/uL Final  . Immature Granulocytes 09/29/2018 0  % Final  . Abs Immature Granulocytes 09/29/2018 0.01  0.00 - 0.07 K/uL Final   Performed at [facility], [address], [place], [place] [address]    Assessment:  Sheron Robin. is a 72 y.o. male with stage III multiple myeloma. He presented in 02/2013 with left-sided sharp pain.  Evaluation revealed wide-spread lytic lesions including fracture of the left 5th rib laterally. CT scans on 02/22/2013 showed innumerable lytic lesions in thoracic spine, sternum, clavicle, scapula, and ribs.   Bone marrow biopsy on 03/13/2013 revealed 15 to 20% plasma cells. Iron stores were absent. Renal function was normal. SPEP on 03/01/2013 revealed a 1.4 gm/dL IgG monoclonal lambda. IgG was 1987.   He began induction with Revlimid 25 mg a day (3 weeks on and 1 week off) and Decadron (40 mg once a week) on 04/09/2013. SPEP has revealed no monoclonal protein since 07/26/2014 (last check 09/29/2018). IgG was 1362 on 07/26/2014 and 1097 on 12/19/2014.   Lambda free light chains have been monitored: 33.20 (ratio of 1.56) on 09/20/2014, 31.51 (ratio of 1.43) on 12/19/2014, 30.25 (ratio of 1.56) on 05/01/2015, 31.95 (ratio 1.49) on 01/01/2016, 28.02 (ratio 1.29) on 03/25/2016, 31.5 (ratio 1.26) on 05/28/2016, 21.8 (ratio 1.35) on 08/20/2016, 37.3 (ratio 1.09) on 10/22/2016, 46.3 (ratio 1.49) on 10/21/2017, and 54 (ratio 1.84) on 04/14/2018.  24 hour UPEP on 12/02/2015 revealed no monoclonal protein.  With remission, Revlimid was decreased to 10 mg  a day then to 10 mg every other day secondary to issues with renal function and diarrhea.  Current Revlimid is 2.5 mg a day (3 weeks on/1 week off).  Last cycle of Revlimid ended on 09/06/2018.  Bone survey on 05/30/2015 revealed the majority of small lytic lesions were stable.  There was possibly new lesion in the distal left clavicle and right mid femur (small) and a possible developing lucency in the proximal left femoral shaft.  The calvarial lesion noted on the prior study was not well seen (? positional).  Bone survey on 11/27/2015 revealed widespread bony lytic lesions consistent with multiple myeloma.  The vast majority of lesions were stable.  A few small lesions were not previously seen.  One lesion was previously obscured by bowel contents.   Two small lesions in the right distal femoral diaphysis appeared new.  Bone survey on 05/25/2016 revealed no evidence of progressive myelomatous involvement of the skeleton.  Bone survey on 05/16/2017 revealed multiple lucent lesions as previously seen. There was no convincing evidence of progression or superimposed abnormality since prior study.  Bone survey on 06/01/2018 revealed scattered lucent lesions, with no evidence for progression.   He received Zometa every 3 months (last 02/28/2017). He takes calcium 4 pills/day. He receives B12 every 6 weeks (last 07/07/2018). B12 was 370 on 01/17/2015 and 365 on 03/03/2018.  Folate was 17.5 on 09/17/2016 and 33 on 03/03/2018.  TSH was normal on 07/07/2018.  Abdominal and pelvic CT scan on 07/15/2015 revealed  extensive sigmoid diverticulosis. There was questionable wall thickening of the ascending colon versus artifact from incomplete distention.  Colonoscopy on 11/11/2015 revealed diverticulosis in the sigmoid colon and distal descending colon and ascending colon.  There was a 2 mm polyp in the mid sigmoid colon.  Pathology revealed a hyperplastic polyp, negative for dysplasia or malignancy.  Symptomatically, he is doing well.  Exam is stable.  Plan: 1.  Labs today:  CBC with diff, CMP, SPEP. 2.  Multiple myeloma:   Clinically doing well.  M-spike 0.  Continue Revlimid (3 weeks on/1 week off).  Next cycle of Revlimid starts tomorrow.  Next bone survey 06/02/2019. 3.  B12 deficiency:  B12 today and every 6 weeks. 4.  RTC 2 weeks for labs (CBC with diff, B12, folate). 5.  RTC in 6 weeks for MD assessment, labs (CBC with diff, CMP, SPEP, FLCA), and B12 injection.    Melissa C Corcoran, MD 09/29/2018, 4:35 PM   

## 2018-09-29 NOTE — Telephone Encounter (Signed)
Called patient to inform him that the list of medications he brought to clinic today are okay to take with his Revlimid per our pharmacist.  Patient grateful for call.

## 2018-10-02 LAB — PROTEIN ELECTROPHORESIS, SERUM
A/G Ratio: 0.9 (ref 0.7–1.7)
Albumin ELP: 3.5 g/dL (ref 2.9–4.4)
Alpha-1-Globulin: 0.2 g/dL (ref 0.0–0.4)
Alpha-2-Globulin: 0.7 g/dL (ref 0.4–1.0)
Beta Globulin: 1.5 g/dL — ABNORMAL HIGH (ref 0.7–1.3)
Gamma Globulin: 1.7 g/dL (ref 0.4–1.8)
Globulin, Total: 4.1 g/dL — ABNORMAL HIGH (ref 2.2–3.9)
Total Protein ELP: 7.6 g/dL (ref 6.0–8.5)

## 2018-10-03 ENCOUNTER — Other Ambulatory Visit: Payer: Self-pay | Admitting: Urgent Care

## 2018-10-03 DIAGNOSIS — C9000 Multiple myeloma not having achieved remission: Secondary | ICD-10-CM

## 2018-10-13 ENCOUNTER — Inpatient Hospital Stay: Payer: Medicare Other | Attending: Hematology and Oncology

## 2018-10-13 DIAGNOSIS — E538 Deficiency of other specified B group vitamins: Secondary | ICD-10-CM | POA: Insufficient documentation

## 2018-10-13 DIAGNOSIS — C9000 Multiple myeloma not having achieved remission: Secondary | ICD-10-CM | POA: Insufficient documentation

## 2018-10-13 LAB — CBC WITH DIFFERENTIAL/PLATELET
Abs Immature Granulocytes: 0.01 10*3/uL (ref 0.00–0.07)
Basophils Absolute: 0.1 10*3/uL (ref 0.0–0.1)
Basophils Relative: 2 %
Eosinophils Absolute: 0.4 10*3/uL (ref 0.0–0.5)
Eosinophils Relative: 12 %
HCT: 31.7 % — ABNORMAL LOW (ref 39.0–52.0)
Hemoglobin: 11.1 g/dL — ABNORMAL LOW (ref 13.0–17.0)
Immature Granulocytes: 0 %
Lymphocytes Relative: 26 %
Lymphs Abs: 0.9 10*3/uL (ref 0.7–4.0)
MCH: 37.2 pg — ABNORMAL HIGH (ref 26.0–34.0)
MCHC: 35 g/dL (ref 30.0–36.0)
MCV: 106.4 fL — ABNORMAL HIGH (ref 80.0–100.0)
Monocytes Absolute: 0.6 10*3/uL (ref 0.1–1.0)
Monocytes Relative: 17 %
Neutro Abs: 1.5 10*3/uL — ABNORMAL LOW (ref 1.7–7.7)
Neutrophils Relative %: 43 %
Platelets: 140 10*3/uL — ABNORMAL LOW (ref 150–400)
RBC: 2.98 MIL/uL — ABNORMAL LOW (ref 4.22–5.81)
RDW: 13.4 % (ref 11.5–15.5)
WBC: 3.4 10*3/uL — ABNORMAL LOW (ref 4.0–10.5)
nRBC: 0 % (ref 0.0–0.2)

## 2018-10-13 LAB — FOLATE: Folate: 35 ng/mL (ref >5.9)

## 2018-10-13 LAB — VITAMIN B12: Vitamin B-12: 493 pg/mL (ref 180–914)

## 2018-10-20 ENCOUNTER — Other Ambulatory Visit: Payer: Self-pay | Admitting: *Deleted

## 2018-10-20 DIAGNOSIS — C9000 Multiple myeloma not having achieved remission: Secondary | ICD-10-CM

## 2018-10-20 MED ORDER — LENALIDOMIDE 2.5 MG PO CAPS: ORAL_CAPSULE | ORAL | 0 refills | Status: DC

## 2018-10-20 NOTE — Telephone Encounter (Signed)
  Let's refill.  M

## 2018-10-20 NOTE — Telephone Encounter (Signed)
Patient took last revlimid of this cycle yesterday, he does not see Dr Mike Gip until 11/13/18 which is 1 week after when he should start his next cycle. He is asking if he needs a refill, or should he wait until he sees doc to start next cycle. Please advise

## 2018-11-10 ENCOUNTER — Ambulatory Visit: Payer: PRIVATE HEALTH INSURANCE

## 2018-11-12 ENCOUNTER — Encounter: Payer: Self-pay | Admitting: Hematology and Oncology

## 2018-11-13 ENCOUNTER — Inpatient Hospital Stay: Payer: Medicare Other

## 2018-11-13 ENCOUNTER — Inpatient Hospital Stay: Payer: Medicare Other | Attending: Hematology and Oncology

## 2018-11-13 ENCOUNTER — Inpatient Hospital Stay (HOSPITAL_BASED_OUTPATIENT_CLINIC_OR_DEPARTMENT_OTHER): Payer: Medicare Other | Admitting: Hematology and Oncology

## 2018-11-13 ENCOUNTER — Encounter: Payer: Self-pay | Admitting: Hematology and Oncology

## 2018-11-13 VITALS — BP 144/75 | HR 60 | Temp 97.1°F | Resp 18 | Wt 218.5 lb

## 2018-11-13 DIAGNOSIS — I509 Heart failure, unspecified: Secondary | ICD-10-CM | POA: Diagnosis not present

## 2018-11-13 DIAGNOSIS — E119 Type 2 diabetes mellitus without complications: Secondary | ICD-10-CM

## 2018-11-13 DIAGNOSIS — I10 Essential (primary) hypertension: Secondary | ICD-10-CM

## 2018-11-13 DIAGNOSIS — Z87891 Personal history of nicotine dependence: Secondary | ICD-10-CM | POA: Diagnosis not present

## 2018-11-13 DIAGNOSIS — E538 Deficiency of other specified B group vitamins: Secondary | ICD-10-CM

## 2018-11-13 DIAGNOSIS — F329 Major depressive disorder, single episode, unspecified: Secondary | ICD-10-CM | POA: Diagnosis not present

## 2018-11-13 DIAGNOSIS — K5790 Diverticulosis of intestine, part unspecified, without perforation or abscess without bleeding: Secondary | ICD-10-CM

## 2018-11-13 DIAGNOSIS — G4730 Sleep apnea, unspecified: Secondary | ICD-10-CM | POA: Insufficient documentation

## 2018-11-13 DIAGNOSIS — E785 Hyperlipidemia, unspecified: Secondary | ICD-10-CM | POA: Diagnosis not present

## 2018-11-13 DIAGNOSIS — Z8042 Family history of malignant neoplasm of prostate: Secondary | ICD-10-CM

## 2018-11-13 DIAGNOSIS — C9000 Multiple myeloma not having achieved remission: Secondary | ICD-10-CM | POA: Diagnosis present

## 2018-11-13 DIAGNOSIS — Z79899 Other long term (current) drug therapy: Secondary | ICD-10-CM | POA: Diagnosis not present

## 2018-11-13 DIAGNOSIS — Z7984 Long term (current) use of oral hypoglycemic drugs: Secondary | ICD-10-CM | POA: Insufficient documentation

## 2018-11-13 DIAGNOSIS — I252 Old myocardial infarction: Secondary | ICD-10-CM | POA: Insufficient documentation

## 2018-11-13 DIAGNOSIS — I209 Angina pectoris, unspecified: Secondary | ICD-10-CM | POA: Insufficient documentation

## 2018-11-13 LAB — CBC WITH DIFFERENTIAL/PLATELET
Abs Immature Granulocytes: 0 10*3/uL (ref 0.00–0.07)
Basophils Absolute: 0.1 10*3/uL (ref 0.0–0.1)
Basophils Relative: 3 %
Eosinophils Absolute: 0.3 10*3/uL (ref 0.0–0.5)
Eosinophils Relative: 11 %
HCT: 33.1 % — ABNORMAL LOW (ref 39.0–52.0)
Hemoglobin: 11.6 g/dL — ABNORMAL LOW (ref 13.0–17.0)
Immature Granulocytes: 0 %
Lymphocytes Relative: 35 %
Lymphs Abs: 0.9 10*3/uL (ref 0.7–4.0)
MCH: 37.3 pg — ABNORMAL HIGH (ref 26.0–34.0)
MCHC: 35 g/dL (ref 30.0–36.0)
MCV: 106.4 fL — ABNORMAL HIGH (ref 80.0–100.0)
Monocytes Absolute: 0.3 10*3/uL (ref 0.1–1.0)
Monocytes Relative: 12 %
Neutro Abs: 1 10*3/uL — ABNORMAL LOW (ref 1.7–7.7)
Neutrophils Relative %: 39 %
Platelets: 130 10*3/uL — ABNORMAL LOW (ref 150–400)
RBC: 3.11 MIL/uL — ABNORMAL LOW (ref 4.22–5.81)
RDW: 13.3 % (ref 11.5–15.5)
WBC: 2.6 10*3/uL — ABNORMAL LOW (ref 4.0–10.5)
nRBC: 0 % (ref 0.0–0.2)

## 2018-11-13 LAB — COMPREHENSIVE METABOLIC PANEL
ALT: 23 U/L (ref 0–44)
AST: 36 U/L (ref 15–41)
Albumin: 3.5 g/dL (ref 3.5–5.0)
Alkaline Phosphatase: 81 U/L (ref 38–126)
Anion gap: 12 (ref 5–15)
BUN: 21 mg/dL (ref 8–23)
CO2: 20 mmol/L — ABNORMAL LOW (ref 22–32)
Calcium: 9.1 mg/dL (ref 8.9–10.3)
Chloride: 105 mmol/L (ref 98–111)
Creatinine, Ser: 1.09 mg/dL (ref 0.61–1.24)
GFR calc Af Amer: >60 mL/min (ref >60)
GFR calc non Af Amer: >60 mL/min (ref >60)
Glucose, Bld: 142 mg/dL — ABNORMAL HIGH (ref 70–99)
Potassium: 4.3 mmol/L (ref 3.5–5.1)
Sodium: 137 mmol/L (ref 135–145)
Total Bilirubin: 1 mg/dL (ref 0.3–1.2)
Total Protein: 8.1 g/dL (ref 6.5–8.1)

## 2018-11-13 MED ORDER — CYANOCOBALAMIN 1000 MCG/ML IJ SOLN
1000.0000 ug | Freq: Once | INTRAMUSCULAR | Status: AC
  Administered 2018-11-13: 1000 ug via INTRAMUSCULAR

## 2018-11-13 NOTE — Progress Notes (Signed)
Pt. Here for f/u. Denies any complaints at this time.

## 2018-11-13 NOTE — Patient Instructions (Signed)
Cyanocobalamin, Vitamin B12 injection What is this medicine? CYANOCOBALAMIN (sye an oh koe BAL a min) is a man made form of vitamin B12. Vitamin B12 is used in the growth of healthy blood cells, nerve cells, and proteins in the body. It also helps with the metabolism of fats and carbohydrates. This medicine is used to treat people who can not absorb vitamin B12. This medicine may be used for other purposes; ask your health care provider or pharmacist if you have questions. COMMON BRAND NAME(S): B-12 Compliance Kit, B-12 Injection Kit, Cyomin, LA-12, Nutri-Twelve, Physicians EZ Use B-12, Primabalt What should I tell my health care provider before I take this medicine? They need to know if you have any of these conditions: -kidney disease -Leber's disease -megaloblastic anemia -an unusual or allergic reaction to cyanocobalamin, cobalt, other medicines, foods, dyes, or preservatives -pregnant or trying to get pregnant -breast-feeding How should I use this medicine? This medicine is injected into a muscle or deeply under the skin. It is usually given by a health care professional in a clinic or doctor's office. However, your doctor may teach you how to inject yourself. Follow all instructions. Talk to your pediatrician regarding the use of this medicine in children. Special care may be needed. Overdosage: If you think you have taken too much of this medicine contact a poison control center or emergency room at once. NOTE: This medicine is only for you. Do not share this medicine with others. What if I miss a dose? If you are given your dose at a clinic or doctor's office, call to reschedule your appointment. If you give your own injections and you miss a dose, take it as soon as you can. If it is almost time for your next dose, take only that dose. Do not take double or extra doses. What may interact with this medicine? -colchicine -heavy alcohol intake This list may not describe all possible  interactions. Give your health care provider a list of all the medicines, herbs, non-prescription drugs, or dietary supplements you use. Also tell them if you smoke, drink alcohol, or use illegal drugs. Some items may interact with your medicine. What should I watch for while using this medicine? Visit your doctor or health care professional regularly. You may need blood work done while you are taking this medicine. You may need to follow a special diet. Talk to your doctor. Limit your alcohol intake and avoid smoking to get the best benefit. What side effects may I notice from receiving this medicine? Side effects that you should report to your doctor or health care professional as soon as possible: -allergic reactions like skin rash, itching or hives, swelling of the face, lips, or tongue -blue tint to skin -chest tightness, pain -difficulty breathing, wheezing -dizziness -red, swollen painful area on the leg Side effects that usually do not require medical attention (report to your doctor or health care professional if they continue or are bothersome): -diarrhea -headache This list may not describe all possible side effects. Call your doctor for medical advice about side effects. You may report side effects to FDA at 1-800-FDA-1088. Where should I keep my medicine? Keep out of the reach of children. Store at room temperature between 15 and 30 degrees C (59 and 85 degrees F). Protect from light. Throw away any unused medicine after the expiration date. NOTE: This sheet is a summary. It may not cover all possible information. If you have questions about this medicine, talk to your doctor, pharmacist, or  health care provider.  2019 Elsevier/Gold Standard (2008-02-05 22:10:20)  

## 2018-11-13 NOTE — Progress Notes (Signed)
. [facility] day:  11/13/2018   Chief Complaint: Luis [name]. is a 73 y.o. male with multiple myeloma who is seen for 6 week assessment on Revlimid.  HPI:  The patient was last seen in the medical oncology clinic on 09/29/2018.  At that time, he was doing well.  Exam was stable. M-spike was 0.  He was to begin his next cycle of Revlimid on 09/30/2018.  During the interim, he has felt pretty good.  He states that he is "watching his sugar".  Blood sugar was 177 this morning.  He received a steroid injection in his back by Dr. Sharlet Salina.  He comments that he has trouble with his knees and a hard time getting up.  He is watching his alcohol intake.  He had 2 drinks over the holidays.  He had 2 glasses of wine with dinner.  He denies any interval infections.  Energy level is good.  He started his last cycle of Revlimid on 10/27/2018; he has 4 Revlimid pills left (last on 11/16/2018).   Past Medical History:  Diagnosis Date  . Angina pectoris ([place])   . Anxiety   . Arthritis   . CHF (congestive heart failure) ([place])   . Coronary artery disease   . Depression   . Diabetes mellitus without complication [facility])    Patient takes Metformin.  . Diverticulosis   . GERD (gastroesophageal reflux disease)   . Hyperlipidemia   . Hypertension   . Multiple myeloma ([place])   . Multiple myeloma ([place]) 03/27/2015  . Myocardial infarction [facility]) April 2001   widowmaker  . Shortness of breath dyspnea   . Sleep apnea    No CPAP @ present  . Spinal stenosis     Past Surgical History:  Procedure Laterality Date  . CARDIAC CATHETERIZATION    . CARPAL TUNNEL RELEASE    . CATARACT EXTRACTION    . COLONOSCOPY WITH PROPOFOL N/A 11/11/2015   Procedure: COLONOSCOPY WITH PROPOFOL;  Surgeon: Lollie Sails, MD;  Location: [facility] ENDOSCOPY;  Service: Endoscopy;  Laterality: N/A;  . CORONARY ARTERY BYPASS GRAFT    . EYE SURGERY Bilateral    Cataract Extraction  .  INGUINAL HERNIA REPAIR    . JOINT REPLACEMENT Right 2008   Right Total Hip Replacement  . PILONIDAL CYST EXCISION    . ROTATOR CUFF REPAIR    . TOTAL HIP ARTHROPLASTY Right   . VENTRAL HERNIA REPAIR N/A 08/15/2015   Procedure: VENTRAL HERNIA REPAIR WITH MESH ;  Surgeon: Leonie Green, MD;  Location: [facility] ORS;  Service: General;  Laterality: N/A;    Family History  Problem Relation Age of Onset  . Heart disease Father   . Stroke Mother   . Prostate cancer Maternal Grandfather 77    Social History:  reports that he quit smoking about 18 years ago. His smoking use included cigarettes. He has a 60.00 pack-year smoking history. He quit smokeless tobacco use about 18 years ago. He reports that he does not drink alcohol or use drugs.  He notes that his wife goes off to work.  He states that his wife would like to retire, but that would affect his medication coverage.  He has projects that he likes to do at home.  He does wood working.  His 42nd high school graduation anniversary was in 06/2016.  He has a new grand-daughter, Luis [name].  His 50th anniversary is coming up.  The patient  is accompanied by his wife, Luis [name], today.  Allergies:  Allergies  Allergen Reactions  . Pravachol [Pravastatin]   . Pravastatin Sodium Other (See Comments)  . Statins Other (See Comments)    Muscle aches  . Zocor [Simvastatin] Other (See Comments)    Muscle aches    Current Medications: Current Outpatient Medications  Medication Sig Dispense Refill  . ALPRAZolam (XANAX) 0.25 MG tablet Take 0.25 mg by mouth at bedtime as needed. for sleep  3  . amoxicillin (AMOXIL) 500 MG capsule TAKE 4 CAPSULES 1 HOUR PRIOR TO DENTAL APPT.    Marland Kitchen aspirin 81 MG tablet Take 81 mg by mouth daily.     [name] Pollen 500 MG CHEW Chew 1 tablet by mouth daily.    . Calcium Carbonate-Vitamin D 600-400 MG-UNIT chew tablet Chew 2 tablets by mouth daily. Takes them throughout the day for a total of 4     . Cyanocobalamin (VITAMIN  B-12 IJ) Inject 1 Dose as directed every 30 (thirty) days.     . ergocalciferol (VITAMIN D2) 50000 UNITS capsule Take 50,000 Units by mouth once a week.    Marland Kitchen glucose blood (ONE TOUCH ULTRA TEST) test strip USE ONCE DAILY. USE AS INSTRUCTED. DX E11.9    . ibuprofen (ADVIL,MOTRIN) 200 MG tablet Take 200 mg by mouth every 6 (six) hours as needed (Pt states he takes 3 tablets QHS).    Marland Kitchen lisinopril (PRINIVIL,ZESTRIL) 20 MG tablet Take 1 tablet by mouth daily.    Marland Kitchen lovastatin (MEVACOR) 40 MG tablet Take 20 mg by mouth at bedtime. 2 tablets a day    . metFORMIN (GLUCOPHAGE) 500 MG tablet 500 mg 2 (two) times daily with a meal.     . omeprazole (PRILOSEC) 20 MG capsule Take 20 mg by mouth daily.    . traMADol (ULTRAM) 50 MG tablet TAKE 1 TABLET BY MOUTH THREE TIMES A DAY AS NEEDED 60 tablet 0  . isosorbide mononitrate (IMDUR) 30 MG 24 hr tablet Take 30 mg by mouth daily.    Marland Kitchen lenalidomide (REVLIMID) 2.5 MG capsule TAKE 1 CAPSULE BY MOUTH ONCE DAILY FOR 21 DAYS ON AND THEN 7 DAYS OFF 21 capsule 0  . Zoledronic Acid (ZOMETA) 4 MG/100ML IVPB Inject into the vein.     No current facility-administered medications for this visit.     Review of Systems  Constitutional: Positive for weight loss (4 pounds). Negative for chills, diaphoresis, fever and malaise/fatigue.       Feels "pretty good".  HENT: Negative.  Negative for congestion, ear pain, nosebleeds, sinus pain and sore throat.   Eyes: Negative.  Negative for blurred vision, double vision and photophobia.  Respiratory: Positive for shortness of breath (exertional). Negative for cough, hemoptysis and sputum production.        Sleep apnea syndrome on PSG.  Cardiovascular: Negative.  Negative for chest pain, palpitations, orthopnea, leg swelling and PND.  Gastrointestinal: Negative.  Negative for abdominal pain, blood in stool, constipation, diarrhea, melena, nausea and vomiting.  Genitourinary: Negative.  Negative for dysuria, frequency, hematuria and  urgency.  Musculoskeletal: Positive for back pain (spinal stenosis s/p injection). Negative for falls, joint pain and neck pain.  Skin: Negative.  Negative for itching and rash.  Neurological: Positive for weakness (requires a cane for balance ). Negative for dizziness, tingling, tremors, sensory change, focal weakness and headaches.  Endo/Heme/Allergies: Bruises/bleeds easily.       Diabetes.  Psychiatric/Behavioral: Negative for depression and memory loss. The patient is not  nervous/anxious and does not have insomnia.   All other systems reviewed and are negative.  Performance status (ECOG): 1  Vital Signs BP (!) 144/75 (BP Location: Right Arm, Patient Position: Sitting)   Pulse 60   Temp (!) 97.1 F (36.2 C) (Tympanic)   Resp 18   Wt 218 lb 7.6 oz (99.1 kg)   BMI 34.22 kg/m   Physical Exam  Constitutional: He is oriented to person, place, and time and well-developed, well-nourished, and in no distress. No distress.  HENT:  Head: Normocephalic and atraumatic.  Mouth/Throat: Oropharynx is clear and moist. No oropharyngeal exudate.  Short white hair.  Eyes: Pupils are equal, round, and reactive to light. Conjunctivae and EOM are normal. No scleral icterus.  Blue eyes.  Neck: Normal range of motion. Neck supple. No JVD present.  Cardiovascular: Normal rate, regular rhythm and normal heart sounds. Exam reveals no gallop and no friction rub.  No murmur heard. Pulmonary/Chest: Effort normal and breath sounds normal. No respiratory distress. He has no wheezes. He has no rales.  Abdominal: Soft. Bowel sounds are normal. He exhibits no distension and no mass. There is no abdominal tenderness. There is no rebound and no guarding.  Musculoskeletal: Normal range of motion.        General: No tenderness or edema.  Lymphadenopathy:    He has no cervical adenopathy.    He has no axillary adenopathy.       Right: No supraclavicular adenopathy present.       Left: No supraclavicular  adenopathy present.  Neurological: He is alert and oriented to person, place, and time. Gait normal.  Skin: Skin is warm and dry. No rash noted. He is not diaphoretic. No erythema. No pallor.  Psychiatric: Mood, affect and judgment normal.  Nursing note and vitals reviewed.   Appointment on 11/13/2018  Component Date Value Ref Range Status  . Total Protein ELP 11/13/2018 7.7  6.0 - 8.5 g/dL Final  . Albumin ELP 11/13/2018 3.4  2.9 - 4.4 g/dL Final  . Alpha-1-Globulin 11/13/2018 0.3  0.0 - 0.4 g/dL Final  . Alpha-2-Globulin 11/13/2018 0.7  0.4 - 1.0 g/dL Final  . Beta Globulin 11/13/2018 1.4* 0.7 - 1.3 g/dL Final  . Gamma Globulin 11/13/2018 1.9* 0.4 - 1.8 g/dL Final  . M-Spike, % 11/13/2018 Not Observed  Not Observed g/dL Final  . SPE Interp. 11/13/2018 Comment   Final   Comment: (NOTE) The SPE pattern reflects a polyclonal increase in gamma globulin due to numerous clones of plasma cells producing heterogeneous antibody in response to some form of antigenic stimulus.  Hypergammaglob- ulinemia is found in a wide variety of infectious, non-infectious, and autoimmune disease states. Evidence of monoclonal protein is not apparent. Performed At: [facility] [place], [place] [id number] Rush Farmer MD [id number]   . Comment 11/13/2018 Comment   Final   Comment: (NOTE) Protein electrophoresis scan will follow via computer, mail, or courier delivery.   . Globulin, Total 11/13/2018 4.3* 2.2 - 3.9 g/dL Corrected  . A/G Ratio 11/13/2018 0.8  0.7 - 1.7 Corrected  . Kappa free light chain 11/13/2018 110.0* 3.3 - 19.4 mg/L Final  . Lamda free light chains 11/13/2018 66.9* 5.7 - 26.3 mg/L Final  . Kappa, lamda light chain ratio 11/13/2018 1.64  0.26 - 1.65 Final   Comment: (NOTE) Performed At: [facility] [place], [place] [id number] Rush Farmer MD [id number]   . Sodium 11/13/2018 137  135 - 145 mmol/L Final  .  Potassium  11/13/2018 4.3  3.5 - 5.1 mmol/L Final  . Chloride 11/13/2018 105  98 - 111 mmol/L Final  . CO2 11/13/2018 20* 22 - 32 mmol/L Final  . Glucose, Bld 11/13/2018 142* 70 - 99 mg/dL Final  . BUN 11/13/2018 21  8 - 23 mg/dL Final  . Creatinine, Ser 11/13/2018 1.09  0.61 - 1.24 mg/dL Final  . Calcium 11/13/2018 9.1  8.9 - 10.3 mg/dL Final  . Total Protein 11/13/2018 8.1  6.5 - 8.1 g/dL Final  . Albumin 11/13/2018 3.5  3.5 - 5.0 g/dL Final  . AST 11/13/2018 36  15 - 41 U/L Final  . ALT 11/13/2018 23  0 - 44 U/L Final  . Alkaline Phosphatase 11/13/2018 81  38 - 126 U/L Final  . Total Bilirubin 11/13/2018 1.0  0.3 - 1.2 mg/dL Final  . GFR calc non Af Amer 11/13/2018 >60  >60 mL/min Final  . GFR calc Af Amer 11/13/2018 >60  >60 mL/min Final  . Anion gap 11/13/2018 12  5 - 15 Final   Performed at [facility], [address], [place], [place] [address]  . WBC 11/13/2018 2.6* 4.0 - 10.5 K/uL Final  . RBC 11/13/2018 3.11* 4.22 - 5.81 MIL/uL Final  . Hemoglobin 11/13/2018 11.6* 13.0 - 17.0 g/dL Final  . HCT 11/13/2018 33.1* 39.0 - 52.0 % Final  . MCV 11/13/2018 106.4* 80.0 - 100.0 fL Final  . MCH 11/13/2018 37.3* 26.0 - 34.0 pg Final  . MCHC 11/13/2018 35.0  30.0 - 36.0 g/dL Final  . RDW 11/13/2018 13.3  11.5 - 15.5 % Final  . Platelets 11/13/2018 130* 150 - 400 K/uL Final   Comment: Immature Platelet Fraction may be clinically indicated, consider ordering this additional test [id number]   . nRBC 11/13/2018 0.0  0.0 - 0.2 % Final  . Neutrophils Relative % 11/13/2018 39  % Final  . Neutro Abs 11/13/2018 1.0* 1.7 - 7.7 K/uL Final  . Lymphocytes Relative 11/13/2018 35  % Final  . Lymphs Abs 11/13/2018 0.9  0.7 - 4.0 K/uL Final  . Monocytes Relative 11/13/2018 12  % Final  . Monocytes Absolute 11/13/2018 0.3  0.1 - 1.0 K/uL Final  . Eosinophils Relative 11/13/2018 11  % Final  . Eosinophils Absolute 11/13/2018 0.3  0.0 - 0.5 K/uL Final  . Basophils Relative 11/13/2018 3  % Final  .  Basophils Absolute 11/13/2018 0.1  0.0 - 0.1 K/uL Final  . Immature Granulocytes 11/13/2018 0  % Final  . Abs Immature Granulocytes 11/13/2018 0.00  0.00 - 0.07 K/uL Final   Performed at [facility], [address] [place], [place] [address]    Assessment:  Lonny Eisen. is a 73 y.o. male with stage III multiple myeloma. He presented in 02/2013 with left-sided sharp pain. Evaluation revealed wide-spread lytic lesions including fracture of the left 5th rib laterally. CT scans on 02/22/2013 showed innumerable lytic lesions in thoracic spine, sternum, clavicle, scapula, and ribs.   Bone marrow biopsy on 03/13/2013 revealed 15 to 20% plasma cells. Iron stores were absent. Renal function was normal. SPEP on 03/01/2013 revealed a 1.4 gm/dL IgG monoclonal lambda. IgG was 1987.   He began induction with Revlimid 25 mg a day (3 weeks on and 1 week off) and Decadron (40 mg once a week) on 04/09/2013. SPEP has revealed no monoclonal protein since 07/26/2014 (last check 11/13/2018). IgG was 1362 on 07/26/2014 and 1097 on 12/19/2014.   Lambda free  light chains have been monitored: 33.20 (ratio of 1.56) on 09/20/2014, 31.51 (ratio of 1.43) on 12/19/2014, 30.25 (ratio of 1.56) on 05/01/2015, 31.95 (ratio 1.49) on 01/01/2016, 28.02 (ratio 1.29) on 03/25/2016, 31.5 (ratio 1.26) on 05/28/2016, 21.8 (ratio 1.35) on 08/20/2016, 37.3 (ratio 1.09) on 10/22/2016, 46.3 (ratio 1.49) on 10/21/2017, 54 (ratio 1.84) on 04/14/2018, 66.9 (ratio 1.64) on 11/13/2018.  24 hour UPEP on 12/02/2015 revealed no monoclonal protein.  With remission, Revlimid was decreased to 10 mg a day then to 10 mg every other day secondary to issues with renal function and diarrhea.  Current Revlimid is 2.5 mg a day (3 weeks on/1 week off).  Last cycle of Revlimid started on 10/27/2018.  Bone survey on 05/30/2015 revealed the majority of small lytic lesions were stable.  There was possibly new lesion in the distal left  clavicle and right mid femur (small) and a possible developing lucency in the proximal left femoral shaft.  The calvarial lesion noted on the prior study was not well seen (? positional).  Bone survey on 11/27/2015 revealed widespread bony lytic lesions consistent with multiple myeloma.  The vast majority of lesions were stable.  A few small lesions were not previously seen.  One lesion was previously obscured by bowel contents.  Two small lesions in the right distal femoral diaphysis appeared new.  Bone survey on 05/25/2016 revealed no evidence of progressive myelomatous involvement of the skeleton.  Bone survey on 05/16/2017 revealed multiple lucent lesions as previously seen. There was no convincing evidence of progression or superimposed abnormality since prior study.  Bone survey on 06/01/2018 revealed scattered lucent lesions, with no evidence for progression.   He received Zometa every 3 months (last 02/28/2017). He takes calcium 4 pills/day. He receives B12 every 6 weeks (last 09/29/2018). B12 was 370 on 01/17/2015 and 493 on 10/13/2018.  Folate was 17.5 on 09/17/2016 and 35 on 10/13/2018.  TSH was normal on 07/07/2018.  Abdominal and pelvic CT scan on 07/15/2015 revealed  extensive sigmoid diverticulosis. There was questionable wall thickening of the ascending colon versus artifact from incomplete distention.  Colonoscopy on 11/11/2015 revealed diverticulosis in the sigmoid colon and distal descending colon and ascending colon.  There was a 2 mm polyp in the mid sigmoid colon.  Pathology revealed a hyperplastic polyp, negative for dysplasia or malignancy.  Symptomatically, he is doing well.  He has chronic back pain secondary to spinal stenosis.  Exam is stable.  Hemoglobin is 11.6, platelets 130,000, WBC 2600, and ANC 1000.  SPEP is 0.  Plan: 1.  Labs today:  CBC with diff, CMP, SPEP, FLCA. 2.  Multiple myeloma   Clinically he continues to do well.  M-spike is 0.  Counts today are  low.  Discuss holding Revlimid x 2 weeks.  Discuss consideration of bone marrow if counts do not improve.  Next bone survey on 06/02/2019. 3.  B12 deficiency  B12 was 493 and folate 35 on 10/13/2018.  Continue B12 today and every 6 weeks.  4.  RTC in 2 weeks for labs (CBC with diff). 5.  RTC in 6 weeks for MD assessment, labs (CBC with diff, CMP, SPEP), and B12.    Lequita Asal, MD 11/13/2018

## 2018-11-14 ENCOUNTER — Other Ambulatory Visit: Payer: Self-pay | Admitting: *Deleted

## 2018-11-14 DIAGNOSIS — C9000 Multiple myeloma not having achieved remission: Secondary | ICD-10-CM

## 2018-11-14 LAB — PROTEIN ELECTROPHORESIS, SERUM
A/G Ratio: 0.8 (ref 0.7–1.7)
Albumin ELP: 3.4 g/dL (ref 2.9–4.4)
Alpha-1-Globulin: 0.3 g/dL (ref 0.0–0.4)
Alpha-2-Globulin: 0.7 g/dL (ref 0.4–1.0)
Beta Globulin: 1.4 g/dL — ABNORMAL HIGH (ref 0.7–1.3)
Gamma Globulin: 1.9 g/dL — ABNORMAL HIGH (ref 0.4–1.8)
Globulin, Total: 4.3 g/dL — ABNORMAL HIGH (ref 2.2–3.9)
Total Protein ELP: 7.7 g/dL (ref 6.0–8.5)

## 2018-11-14 LAB — KAPPA/LAMBDA LIGHT CHAINS
Kappa free light chain: 110 mg/L — ABNORMAL HIGH (ref 3.3–19.4)
Kappa, lambda light chain ratio: 1.64 (ref 0.26–1.65)
Lambda free light chains: 66.9 mg/L — ABNORMAL HIGH (ref 5.7–26.3)

## 2018-11-14 MED ORDER — LENALIDOMIDE 2.5 MG PO CAPS: ORAL_CAPSULE | ORAL | 0 refills | Status: DC

## 2018-11-14 NOTE — Telephone Encounter (Signed)
...     Ref Range & Units 1d ago  WBC 4.0 - 10.5 K/uL 2.6Low    RBC 4.22 - 5.81 MIL/uL 3.11Low    Hemoglobin 13.0 - 17.0 g/dL 11.6Low    HCT 39.0 - 52.0 % 33.1Low    MCV 80.0 - 100.0 fL 106.4High    MCH 26.0 - 34.0 pg 37.3High    MCHC 30.0 - 36.0 g/dL 35.0   RDW 11.5 - 15.5 % 13.3   Platelets 150 - 400 K/uL 130Low    Comment: Immature Platelet Fraction may be  clinically indicated, consider  ordering this additional test  [id number]   nRBC 0.0 - 0.2 % 0.0   Neutrophils Relative % % 39   Neutro Abs 1.7 - 7.7 K/uL 1.0Low    Lymphocytes Relative % 35   Lymphs Abs 0.7 - 4.0 K/uL 0.9   Monocytes Relative % 12   Monocytes Absolute 0.1 - 1.0 K/uL 0.3   Eosinophils Relative % 11   Eosinophils Absolute 0.0 - 0.5 K/uL 0.3   Basophils Relative % 3   Basophils Absolute 0.0 - 0.1 K/uL 0.1   Immature Granulocytes % 0   Abs Immature Granulocytes 0.00 - 0.07 K/uL 0.00   Comment: Performed at [facility], [address] [name], [place] [address]  Resulting Agency  [facility]      Specimen Collected: 11/13/18 13:45  Last Resulted: 11/13/18 14:42       ...   Ref Range & Units 1d ago  Sodium 135 - 145 mmol/L 137   Potassium 3.5 - 5.1 mmol/L 4.3   Chloride 98 - 111 mmol/L 105   CO2 22 - 32 mmol/L 20Low    Glucose, Bld 70 - 99 mg/dL 142High    BUN 8 - 23 mg/dL 21   Creatinine, Ser 0.61 - 1.24 mg/dL 1.09   Calcium 8.9 - 10.3 mg/dL 9.1   Total Protein 6.5 - 8.1 g/dL 8.1   Albumin 3.5 - 5.0 g/dL 3.5   AST 15 - 41 U/L 36   ALT 0 - 44 U/L 23   Alkaline Phosphatase 38 - 126 U/L 81   Total Bilirubin 0.3 - 1.2 mg/dL 1.0   GFR calc non Af Amer >60 mL/min >60   GFR calc Af Amer >60 mL/min >60   Anion gap 5 - 15 12   Comment: Performed at [facility], [address], [place], [place] [address]  Resulting Agency  [facility]      Specimen Collected: 11/13/18 13:45  Last Resulted: 11/13/18 14:11

## 2018-11-27 ENCOUNTER — Inpatient Hospital Stay: Payer: Medicare Other

## 2018-11-27 DIAGNOSIS — C9000 Multiple myeloma not having achieved remission: Secondary | ICD-10-CM

## 2018-11-27 DIAGNOSIS — E538 Deficiency of other specified B group vitamins: Secondary | ICD-10-CM

## 2018-11-27 LAB — CBC WITH DIFFERENTIAL/PLATELET
Abs Immature Granulocytes: 0 10*3/uL (ref 0.00–0.07)
Basophils Absolute: 0.1 10*3/uL (ref 0.0–0.1)
Basophils Relative: 2 %
Eosinophils Absolute: 0.2 10*3/uL (ref 0.0–0.5)
Eosinophils Relative: 7 %
HCT: 34.2 % — ABNORMAL LOW (ref 39.0–52.0)
Hemoglobin: 12.1 g/dL — ABNORMAL LOW (ref 13.0–17.0)
Immature Granulocytes: 0 %
Lymphocytes Relative: 36 %
Lymphs Abs: 1.3 10*3/uL (ref 0.7–4.0)
MCH: 37.2 pg — ABNORMAL HIGH (ref 26.0–34.0)
MCHC: 35.4 g/dL (ref 30.0–36.0)
MCV: 105.2 fL — ABNORMAL HIGH (ref 80.0–100.0)
Monocytes Absolute: 0.5 10*3/uL (ref 0.1–1.0)
Monocytes Relative: 13 %
Neutro Abs: 1.5 10*3/uL — ABNORMAL LOW (ref 1.7–7.7)
Neutrophils Relative %: 42 %
Platelets: 135 10*3/uL — ABNORMAL LOW (ref 150–400)
RBC: 3.25 MIL/uL — ABNORMAL LOW (ref 4.22–5.81)
RDW: 13.1 % (ref 11.5–15.5)
WBC: 3.5 10*3/uL — ABNORMAL LOW (ref 4.0–10.5)
nRBC: 0 % (ref 0.0–0.2)

## 2018-12-18 ENCOUNTER — Other Ambulatory Visit: Payer: Self-pay | Admitting: Urgent Care

## 2018-12-18 DIAGNOSIS — C9000 Multiple myeloma not having achieved remission: Secondary | ICD-10-CM

## 2018-12-18 MED ORDER — LENALIDOMIDE 2.5 MG PO CAPS: ORAL_CAPSULE | ORAL | 0 refills | Status: DC

## 2018-12-25 ENCOUNTER — Inpatient Hospital Stay: Payer: Medicare Other | Attending: Hematology and Oncology

## 2018-12-25 ENCOUNTER — Inpatient Hospital Stay (HOSPITAL_BASED_OUTPATIENT_CLINIC_OR_DEPARTMENT_OTHER): Payer: Medicare Other | Admitting: Hematology and Oncology

## 2018-12-25 ENCOUNTER — Inpatient Hospital Stay: Payer: Medicare Other

## 2018-12-25 ENCOUNTER — Encounter: Payer: Self-pay | Admitting: Hematology and Oncology

## 2018-12-25 VITALS — BP 136/62 | HR 53 | Temp 97.8°F | Resp 18 | Ht 67.0 in | Wt 222.0 lb

## 2018-12-25 DIAGNOSIS — I10 Essential (primary) hypertension: Secondary | ICD-10-CM

## 2018-12-25 DIAGNOSIS — E785 Hyperlipidemia, unspecified: Secondary | ICD-10-CM

## 2018-12-25 DIAGNOSIS — Z86010 Personal history of colonic polyps: Secondary | ICD-10-CM | POA: Insufficient documentation

## 2018-12-25 DIAGNOSIS — I509 Heart failure, unspecified: Secondary | ICD-10-CM | POA: Insufficient documentation

## 2018-12-25 DIAGNOSIS — I2510 Atherosclerotic heart disease of native coronary artery without angina pectoris: Secondary | ICD-10-CM | POA: Insufficient documentation

## 2018-12-25 DIAGNOSIS — G4730 Sleep apnea, unspecified: Secondary | ICD-10-CM

## 2018-12-25 DIAGNOSIS — Z8719 Personal history of other diseases of the digestive system: Secondary | ICD-10-CM | POA: Diagnosis not present

## 2018-12-25 DIAGNOSIS — E119 Type 2 diabetes mellitus without complications: Secondary | ICD-10-CM | POA: Insufficient documentation

## 2018-12-25 DIAGNOSIS — Z8042 Family history of malignant neoplasm of prostate: Secondary | ICD-10-CM

## 2018-12-25 DIAGNOSIS — K219 Gastro-esophageal reflux disease without esophagitis: Secondary | ICD-10-CM

## 2018-12-25 DIAGNOSIS — F329 Major depressive disorder, single episode, unspecified: Secondary | ICD-10-CM

## 2018-12-25 DIAGNOSIS — Z87891 Personal history of nicotine dependence: Secondary | ICD-10-CM

## 2018-12-25 DIAGNOSIS — M4800 Spinal stenosis, site unspecified: Secondary | ICD-10-CM | POA: Insufficient documentation

## 2018-12-25 DIAGNOSIS — C9000 Multiple myeloma not having achieved remission: Secondary | ICD-10-CM | POA: Diagnosis not present

## 2018-12-25 DIAGNOSIS — R0602 Shortness of breath: Secondary | ICD-10-CM | POA: Diagnosis not present

## 2018-12-25 DIAGNOSIS — Z79899 Other long term (current) drug therapy: Secondary | ICD-10-CM

## 2018-12-25 DIAGNOSIS — E538 Deficiency of other specified B group vitamins: Secondary | ICD-10-CM

## 2018-12-25 DIAGNOSIS — D649 Anemia, unspecified: Secondary | ICD-10-CM | POA: Insufficient documentation

## 2018-12-25 DIAGNOSIS — Z7984 Long term (current) use of oral hypoglycemic drugs: Secondary | ICD-10-CM

## 2018-12-25 DIAGNOSIS — I252 Old myocardial infarction: Secondary | ICD-10-CM | POA: Diagnosis not present

## 2018-12-25 DIAGNOSIS — Z7982 Long term (current) use of aspirin: Secondary | ICD-10-CM | POA: Insufficient documentation

## 2018-12-25 LAB — CBC WITH DIFFERENTIAL/PLATELET
Abs Immature Granulocytes: 0.01 10*3/uL (ref 0.00–0.07)
Basophils Absolute: 0 10*3/uL (ref 0.0–0.1)
Basophils Relative: 1 %
Eosinophils Absolute: 0.2 10*3/uL (ref 0.0–0.5)
Eosinophils Relative: 5 %
HCT: 30.3 % — ABNORMAL LOW (ref 39.0–52.0)
Hemoglobin: 10.8 g/dL — ABNORMAL LOW (ref 13.0–17.0)
Immature Granulocytes: 0 %
Lymphocytes Relative: 32 %
Lymphs Abs: 1.1 10*3/uL (ref 0.7–4.0)
MCH: 38 pg — ABNORMAL HIGH (ref 26.0–34.0)
MCHC: 35.6 g/dL (ref 30.0–36.0)
MCV: 106.7 fL — ABNORMAL HIGH (ref 80.0–100.0)
Monocytes Absolute: 0.4 10*3/uL (ref 0.1–1.0)
Monocytes Relative: 12 %
Neutro Abs: 1.7 10*3/uL (ref 1.7–7.7)
Neutrophils Relative %: 50 %
Platelets: 133 10*3/uL — ABNORMAL LOW (ref 150–400)
RBC: 2.84 MIL/uL — ABNORMAL LOW (ref 4.22–5.81)
RDW: 14.1 % (ref 11.5–15.5)
WBC: 3.4 10*3/uL — ABNORMAL LOW (ref 4.0–10.5)
nRBC: 0 % (ref 0.0–0.2)

## 2018-12-25 LAB — COMPREHENSIVE METABOLIC PANEL
ALT: 24 U/L (ref 0–44)
AST: 43 U/L — ABNORMAL HIGH (ref 15–41)
Albumin: 3.3 g/dL — ABNORMAL LOW (ref 3.5–5.0)
Alkaline Phosphatase: 70 U/L (ref 38–126)
Anion gap: 7 (ref 5–15)
BUN: 19 mg/dL (ref 8–23)
CO2: 23 mmol/L (ref 22–32)
Calcium: 9.2 mg/dL (ref 8.9–10.3)
Chloride: 109 mmol/L (ref 98–111)
Creatinine, Ser: 1.04 mg/dL (ref 0.61–1.24)
GFR calc Af Amer: >60 mL/min (ref >60)
GFR calc non Af Amer: >60 mL/min (ref >60)
Glucose, Bld: 186 mg/dL — ABNORMAL HIGH (ref 70–99)
Potassium: 4.4 mmol/L (ref 3.5–5.1)
Sodium: 139 mmol/L (ref 135–145)
Total Bilirubin: 0.7 mg/dL (ref 0.3–1.2)
Total Protein: 7.9 g/dL (ref 6.5–8.1)

## 2018-12-25 LAB — RETICULOCYTES
Immature Retic Fract: 12.9 % (ref 2.3–15.9)
RBC.: 2.95 MIL/uL — ABNORMAL LOW (ref 4.22–5.81)
Retic Count, Absolute: 46.6 10*3/uL (ref 19.0–186.0)
Retic Ct Pct: 1.6 % (ref 0.4–3.1)

## 2018-12-25 LAB — IRON AND TIBC
Iron: 236 ug/dL — ABNORMAL HIGH (ref 45–182)
Saturation Ratios: 78 % — ABNORMAL HIGH (ref 17.9–39.5)
TIBC: 302 ug/dL (ref 250–450)
UIBC: 66 ug/dL

## 2018-12-25 LAB — FERRITIN: Ferritin: 212 ng/mL (ref 24–336)

## 2018-12-25 MED ORDER — CYANOCOBALAMIN 1000 MCG/ML IJ SOLN
1000.0000 ug | Freq: Once | INTRAMUSCULAR | Status: AC
  Administered 2018-12-25: 1000 ug via INTRAMUSCULAR

## 2018-12-25 NOTE — Progress Notes (Signed)
No new changes noted today 

## 2018-12-25 NOTE — Progress Notes (Signed)
. [facility]-  Cancer Center  Clinic day:  12/25/2018   Chief Complaint: Luis A Wrightson Jr. is a 73 y.o. male with multiple myeloma who is seen for 6 week assessment on Revlimid.  HPI:  The patient was last seen in the medical oncology clinic on 11/13/2018.  At that time, he wasdoing well.  He had chronic back pain secondary to spinal stenosis.  Exam was stable.  Hemoglobin was 11.6, platelets 130,000, WBC 2600, and ANC 1000.  SPEP was 0.  Revlimid was held x 2 weeks.  CBC on 11/27/2018 revealed a hematocrit of 34.2, hemoglobin 12.1, MCV 105.2, platelets 135,0000, WBC 3500 with an ANC of 1500.  During the interim, he has felt "pretty good".  He has been off Revlimid.  He has felt "tired and sleepy".  He feels that it is too cold to exercise.  He has been visiting his grandchildren.  He saw Dr. Chasnis for a follow-up appointment.  He drinks alcohol (2 glasses wine/day and 2 Bourbon/day).   Past Medical History:  Diagnosis Date  . Angina pectoris (HCC)   . Anxiety   . Arthritis   . CHF (congestive heart failure) (HCC)   . Coronary artery disease   . Depression   . Diabetes mellitus without complication (HCC)    Patient takes Metformin.  . Diverticulosis   . GERD (gastroesophageal reflux disease)   . Hyperlipidemia   . Hypertension   . Multiple myeloma (HCC)   . Multiple myeloma (HCC) 03/27/2015  . Myocardial infarction (HCC) April 2001   widowmaker  . Shortness of breath dyspnea   . Sleep apnea    No CPAP @ present  . Spinal stenosis     Past Surgical History:  Procedure Laterality Date  . CARDIAC CATHETERIZATION    . CARPAL TUNNEL RELEASE    . CATARACT EXTRACTION    . COLONOSCOPY WITH PROPOFOL N/A 11/11/2015   Procedure: COLONOSCOPY WITH PROPOFOL;  Surgeon: Martin U Skulskie, MD;  Location: [facility] ENDOSCOPY;  Service: Endoscopy;  Laterality: N/A;  . CORONARY ARTERY BYPASS GRAFT    . EYE SURGERY Bilateral    Cataract Extraction  . INGUINAL HERNIA REPAIR     . JOINT REPLACEMENT Right 2008   Right Total Hip Replacement  . PILONIDAL CYST EXCISION    . ROTATOR CUFF REPAIR    . TOTAL HIP ARTHROPLASTY Right   . VENTRAL HERNIA REPAIR N/A 08/15/2015   Procedure: VENTRAL HERNIA REPAIR WITH MESH ;  Surgeon: Jarvis Wilton Smith, MD;  Location: [facility] ORS;  Service: General;  Laterality: N/A;    Family History  Problem Relation Age of Onset  . Heart disease Father   . Stroke Mother   . Prostate cancer Maternal Grandfather 90    Social History:  reports that he quit smoking about 18 years ago. His smoking use included cigarettes. He has a 60.00 pack-year smoking history. He quit smokeless tobacco use about 18 years ago. He reports that he does not drink alcohol or use drugs.  He drinks 2 glasses wine/day and 2 Bourbon/day.  He notes that his wife goes off to work.  He states that his wife would like to retire, but that would affect his medication coverage.  He has projects that he likes to do at home.  He does wood working.  He has a class on Wednesdays (wood turning).  His 42nd high school graduation anniversary was in 06/2016.  He has a new grand-daughter, [name].  He had   his 50th anniversary.  His wife's name is [name].  The patient is alone today.  Allergies:  Allergies  Allergen Reactions  . Pravachol [Pravastatin]   . Pravastatin Sodium Other (See Comments)  . Statins Other (See Comments)    Muscle aches  . Zocor [Simvastatin] Other (See Comments)    Muscle aches    Current Medications: Current Outpatient Medications  Medication Sig Dispense Refill  . ALPRAZolam (XANAX) 0.25 MG tablet Take 0.25 mg by mouth at bedtime as needed. for sleep  3  . aspirin 81 MG tablet Take 81 mg by mouth daily.     . Bee Pollen 500 MG CHEW Chew 1 tablet by mouth daily.    . Calcium Carbonate-Vitamin D 600-400 MG-UNIT chew tablet Chew 2 tablets by mouth daily. Takes them throughout the day for a total of 4     . Cyanocobalamin (VITAMIN B-12 IJ) Inject 1 Dose as  directed every 30 (thirty) days.     . ergocalciferol (VITAMIN D2) 50000 UNITS capsule Take 50,000 Units by mouth once a week.    . isosorbide mononitrate (IMDUR) 30 MG 24 hr tablet Take 30 mg by mouth daily.    . lisinopril (PRINIVIL,ZESTRIL) 20 MG tablet Take 1 tablet by mouth daily.    . lovastatin (MEVACOR) 40 MG tablet Take 20 mg by mouth at bedtime. 2 tablets a day    . metFORMIN (GLUCOPHAGE) 500 MG tablet 500 mg 2 (two) times daily with a meal.     . omeprazole (PRILOSEC) 20 MG capsule Take 20 mg by mouth daily.    . amoxicillin (AMOXIL) 500 MG capsule TAKE 4 CAPSULES 1 HOUR PRIOR TO DENTAL APPT.    . glucose blood (ONE TOUCH ULTRA TEST) test strip USE ONCE DAILY. USE AS INSTRUCTED. DX E11.9    . ibuprofen (ADVIL,MOTRIN) 200 MG tablet Take 200 mg by mouth every 6 (six) hours as needed (Pt states he takes 3 tablets QHS).    . lenalidomide (REVLIMID) 2.5 MG capsule TAKE 1 CAPSULE BY MOUTH ONCE DAILY FOR 21 DAYS ON AND THEN 7 DAYS OFF (Patient not taking: Reported on 12/25/2018) 21 capsule 0  . traMADol (ULTRAM) 50 MG tablet TAKE 1 TABLET BY MOUTH THREE TIMES A DAY AS NEEDED (Patient not taking: Reported on 12/25/2018) 60 tablet 0  . Zoledronic Acid (ZOMETA) 4 MG/100ML IVPB Inject into the vein.     No current facility-administered medications for this visit.     Review of Systems  Constitutional: Negative for chills, diaphoresis, fever, malaise/fatigue and weight loss (4 pounds).       Feels "pretty good".  Tired and sleepy.  Less activity secondary to cold weather.  HENT: Negative.  Negative for congestion, ear discharge, ear pain, nosebleeds, sinus pain and sore throat.   Eyes: Negative.  Negative for blurred vision, double vision, photophobia and pain.  Respiratory: Positive for shortness of breath (exertional). Negative for cough, hemoptysis and sputum production.        Sleep apnea on CPAP.  Cardiovascular: Negative.  Negative for chest pain, palpitations, orthopnea, leg swelling and  PND.  Gastrointestinal: Negative.  Negative for abdominal pain, blood in stool, constipation, diarrhea, heartburn, melena, nausea and vomiting.  Genitourinary: Negative.  Negative for dysuria, frequency, hematuria and urgency.  Musculoskeletal: Positive for back pain (spinal stenosis). Negative for falls, joint pain, myalgias and neck pain.  Skin: Negative.  Negative for itching and rash.  Neurological: Negative for dizziness, tingling, tremors, sensory change, focal weakness and headaches. Weakness:   requires a cane for balance   Endo/Heme/Allergies: Bruises/bleeds easily.       Diabetes.  Psychiatric/Behavioral: Negative.  Negative for depression and memory loss. The patient is not nervous/anxious and does not have insomnia.   All other systems reviewed and are negative.  Performance status (ECOG): 1  Vital Signs BP 136/62 (BP Location: Left Arm, Patient Position: Sitting)   Pulse (!) 53   Temp 97.8 F (36.6 C) (Tympanic)   Resp 18   Ht 5' 7" (1.702 m)   Wt 222 lb 0.1 oz (100.7 kg)   SpO2 98%   BMI 34.77 kg/m   Physical Exam  Constitutional: He is oriented to person, place, and time and well-developed, well-nourished, and in no distress. No distress.  HENT:  Head: Normocephalic and atraumatic.  Mouth/Throat: Oropharynx is clear and moist. No oropharyngeal exudate.  Short white hair.  Male pattern baldness.  Eyes: Pupils are equal, round, and reactive to light. Conjunctivae and EOM are normal. No scleral icterus.  Blue eyes.  Neck: Normal range of motion. Neck supple. No JVD present.  Cardiovascular: Normal rate, regular rhythm and normal heart sounds. Exam reveals no gallop and no friction rub.  No murmur heard. Pulmonary/Chest: Effort normal and breath sounds normal. No respiratory distress. He has no wheezes. He has no rales.  Abdominal: Soft. Bowel sounds are normal. He exhibits no distension and no mass. There is no abdominal tenderness. There is no rebound and no guarding.   Musculoskeletal: Normal range of motion.        General: No tenderness or edema.  Lymphadenopathy:    He has no cervical adenopathy.    He has no axillary adenopathy.       Right: No supraclavicular adenopathy present.       Left: No supraclavicular adenopathy present.  Neurological: He is alert and oriented to person, place, and time. Gait normal.  Skin: Skin is warm and dry. No rash noted. He is not diaphoretic. No erythema. No pallor.  Psychiatric: Mood, affect and judgment normal.  Nursing note and vitals reviewed.   Appointment on 12/25/2018  Component Date Value Ref Range Status  . Sodium 12/25/2018 139  135 - 145 mmol/L Final  . Potassium 12/25/2018 4.4  3.5 - 5.1 mmol/L Final  . Chloride 12/25/2018 109  98 - 111 mmol/L Final  . CO2 12/25/2018 23  22 - 32 mmol/L Final  . Glucose, Bld 12/25/2018 186* 70 - 99 mg/dL Final  . BUN 12/25/2018 19  8 - 23 mg/dL Final  . Creatinine, Ser 12/25/2018 1.04  0.61 - 1.24 mg/dL Final  . Calcium 12/25/2018 9.2  8.9 - 10.3 mg/dL Final  . Total Protein 12/25/2018 7.9  6.5 - 8.1 g/dL Final  . Albumin 12/25/2018 3.3* 3.5 - 5.0 g/dL Final  . AST 12/25/2018 43* 15 - 41 U/L Final  . ALT 12/25/2018 24  0 - 44 U/L Final  . Alkaline Phosphatase 12/25/2018 70  38 - 126 U/L Final  . Total Bilirubin 12/25/2018 0.7  0.3 - 1.2 mg/dL Final  . GFR calc non Af Amer 12/25/2018 >60  >60 mL/min Final  . GFR calc Af Amer 12/25/2018 >60  >60 mL/min Final  . Anion gap 12/25/2018 7  5 - 15 Final   Performed at [facility], [address] [place], [place] [address]  . WBC 12/25/2018 3.4* 4.0 - 10.5 K/uL Final  . RBC 12/25/2018 2.84* 4.22 - 5.81 MIL/uL Final  . Hemoglobin 12/25/2018 10.8* 13.0 -   17.0 g/dL Final  . HCT 12/25/2018 30.3* 39.0 - 52.0 % Final  . MCV 12/25/2018 106.7* 80.0 - 100.0 fL Final  . MCH 12/25/2018 38.0* 26.0 - 34.0 pg Final  . MCHC 12/25/2018 35.6  30.0 - 36.0 g/dL Final  . RDW 12/25/2018 14.1  11.5 - 15.5 % Final  .  Platelets 12/25/2018 133* 150 - 400 K/uL Final   Comment: Immature Platelet Fraction may be clinically indicated, consider ordering this additional test [id number]   . nRBC 12/25/2018 0.0  0.0 - 0.2 % Final  . Neutrophils Relative % 12/25/2018 50  % Final  . Neutro Abs 12/25/2018 1.7  1.7 - 7.7 K/uL Final  . Lymphocytes Relative 12/25/2018 32  % Final  . Lymphs Abs 12/25/2018 1.1  0.7 - 4.0 K/uL Final  . Monocytes Relative 12/25/2018 12  % Final  . Monocytes Absolute 12/25/2018 0.4  0.1 - 1.0 K/uL Final  . Eosinophils Relative 12/25/2018 5  % Final  . Eosinophils Absolute 12/25/2018 0.2  0.0 - 0.5 K/uL Final  . Basophils Relative 12/25/2018 1  % Final  . Basophils Absolute 12/25/2018 0.0  0.0 - 0.1 K/uL Final  . Immature Granulocytes 12/25/2018 0  % Final  . Abs Immature Granulocytes 12/25/2018 0.01  0.00 - 0.07 K/uL Final   Performed at [facility], [address] [place], [place] [address]    Assessment:  Yandiel Bergum. is a 73 y.o. male with stage III multiple myeloma. He presented in 02/2013 with left-sided sharp pain. Evaluation revealed wide-spread lytic lesions including fracture of the left 5th rib laterally. CT scans on 02/22/2013 showed innumerable lytic lesions in thoracic spine, sternum, clavicle, scapula, and ribs.   Bone marrow biopsy on 03/13/2013 revealed 15 to 20% plasma cells. Iron stores were absent. Renal function was normal. SPEP on 03/01/2013 revealed a 1.4 gm/dL IgG monoclonal lambda. IgG was 1987.   He began induction with Revlimid 25 mg a day (3 weeks on and 1 week off) and Decadron (40 mg once a week) on 04/09/2013. SPEP has revealed no monoclonal protein since 07/26/2014 (last check 12/15/2018). IgG was 1362 on 07/26/2014 and 1097 on 12/19/2014.   Lambda free light chains have been monitored: 33.20 (ratio of 1.56) on 09/20/2014, 31.51 (ratio of 1.43) on 12/19/2014, 30.25 (ratio of 1.56) on 05/01/2015, 31.95 (ratio 1.49) on 01/01/2016,  28.02 (ratio 1.29) on 03/25/2016, 31.5 (ratio 1.26) on 05/28/2016, 21.8 (ratio 1.35) on 08/20/2016, 37.3 (ratio 1.09) on 10/22/2016, 46.3 (ratio 1.49) on 10/21/2017, 54 (ratio 1.84) on 04/14/2018, 66.9 (ratio 1.64) on 11/13/2018.  24 hour UPEP on 12/02/2015 revealed no monoclonal protein.  With remission, Revlimid was decreased to 10 mg a day then to 10 mg every other day secondary to issues with renal function and diarrhea.  Current Revlimid is 2.5 mg a day (3 weeks on/1 week off).  Last cycle of Revlimid started on 10/27/2018.  Bone survey on 05/30/2015 revealed the majority of small lytic lesions were stable.  There was possibly new lesion in the distal left clavicle and right mid femur (small) and a possible developing lucency in the proximal left femoral shaft.  The calvarial lesion noted on the prior study was not well seen (? positional).  Bone survey on 11/27/2015 revealed widespread bony lytic lesions consistent with multiple myeloma.  The vast majority of lesions were stable.  A few small lesions were not previously seen.  One lesion was previously obscured by bowel contents.  Two small lesions in  the right distal femoral diaphysis appeared new.  Bone survey on 05/25/2016 revealed no evidence of progressive myelomatous involvement of the skeleton.  Bone survey on 05/16/2017 revealed multiple lucent lesions as previously seen. There was no convincing evidence of progression or superimposed abnormality since prior study.  Bone survey on 06/01/2018 revealed scattered lucent lesions, with no evidence for progression.   He received Zometa every 3 months (last 02/28/2017). He takes calcium 4 pills/day. He receives B12 every 6 weeks (last 09/29/2018). B12 was 370 on 01/17/2015 and 493 on 10/13/2018.  Folate was 17.5 on 09/17/2016 and 35 on 10/13/2018.  TSH was normal on 07/07/2018.  Abdominal and pelvic CT scan on 07/15/2015 revealed  extensive sigmoid diverticulosis. There was questionable wall  thickening of the ascending colon versus artifact from incomplete distention.  Colonoscopy on 11/11/2015 revealed diverticulosis in the sigmoid colon and distal descending colon and ascending colon.  There was a 2 mm polyp in the mid sigmoid colon.  Pathology revealed a hyperplastic polyp, negative for dysplasia or malignancy.  Symptomatically,  He feels "pretty good".  He is not getting exercise secondary to the cold weather.  Plan: 1.   Labs today:  CBC with diff, CMP, myeloma panel. 2.   Multiple myeloma   Clinically he is doing well.  M-spike remains 0.  Counts today are borderline (WBC 3400 with an ANC of 1700).  Hemoglobin is 10.8.  Continue to hold Revlimid as M-spike 0.  Review consideration of bone marrow if counts do not improve.  Encourage patient to cut back on alcohol (marrow suppressant).  Bone survey due on 06/02/2019. 3.  Macrocytic anemia   Progressive anemia.  Anemia may be secondary to myeloma (doubt), Revlimid, and possible MDS.  Encourage cutting back on alcohol.  Check retic, ferritin, iron studies. 4.   B12 deficiency  B12 was 493 and folate 35 on 10/13/2018.  B12 today and every 4-6 weeks. 5.  RTC in 4 weeks for MD assessment, labs (CBC with diff, CMP), B12, and consideration of reinitiation of Revlimid.     Lequita Asal, MD 12/25/2018, 3:22 PM

## 2018-12-25 NOTE — Patient Instructions (Signed)
Cyanocobalamin, Vitamin B12 injection What is this medicine? CYANOCOBALAMIN (sye an oh koe BAL a min) is a man made form of vitamin B12. Vitamin B12 is used in the growth of healthy blood cells, nerve cells, and proteins in the body. It also helps with the metabolism of fats and carbohydrates. This medicine is used to treat people who can not absorb vitamin B12. This medicine may be used for other purposes; ask your health care provider or pharmacist if you have questions. COMMON BRAND NAME(S): B-12 Compliance Kit, B-12 Injection Kit, Cyomin, LA-12, Nutri-Twelve, Physicians EZ Use B-12, Primabalt What should I tell my health care provider before I take this medicine? They need to know if you have any of these conditions: -kidney disease -Leber's disease -megaloblastic anemia -an unusual or allergic reaction to cyanocobalamin, cobalt, other medicines, foods, dyes, or preservatives -pregnant or trying to get pregnant -breast-feeding How should I use this medicine? This medicine is injected into a muscle or deeply under the skin. It is usually given by a health care professional in a clinic or doctor's office. However, your doctor may teach you how to inject yourself. Follow all instructions. Talk to your pediatrician regarding the use of this medicine in children. Special care may be needed. Overdosage: If you think you have taken too much of this medicine contact a poison control center or emergency room at once. NOTE: This medicine is only for you. Do not share this medicine with others. What if I miss a dose? If you are given your dose at a clinic or doctor's office, call to reschedule your appointment. If you give your own injections and you miss a dose, take it as soon as you can. If it is almost time for your next dose, take only that dose. Do not take double or extra doses. What may interact with this medicine? -colchicine -heavy alcohol intake This list may not describe all possible  interactions. Give your health care provider a list of all the medicines, herbs, non-prescription drugs, or dietary supplements you use. Also tell them if you smoke, drink alcohol, or use illegal drugs. Some items may interact with your medicine. What should I watch for while using this medicine? Visit your doctor or health care professional regularly. You may need blood work done while you are taking this medicine. You may need to follow a special diet. Talk to your doctor. Limit your alcohol intake and avoid smoking to get the best benefit. What side effects may I notice from receiving this medicine? Side effects that you should report to your doctor or health care professional as soon as possible: -allergic reactions like skin rash, itching or hives, swelling of the face, lips, or tongue -blue tint to skin -chest tightness, pain -difficulty breathing, wheezing -dizziness -red, swollen painful area on the leg Side effects that usually do not require medical attention (report to your doctor or health care professional if they continue or are bothersome): -diarrhea -headache This list may not describe all possible side effects. Call your doctor for medical advice about side effects. You may report side effects to FDA at 1-800-FDA-1088. Where should I keep my medicine? Keep out of the reach of children. Store at room temperature between 15 and 30 degrees C (59 and 85 degrees F). Protect from light. Throw away any unused medicine after the expiration date. NOTE: This sheet is a summary. It may not cover all possible information. If you have questions about this medicine, talk to your doctor, pharmacist, or  health care provider.  2019 Elsevier/Gold Standard (2008-02-05 22:10:20)  

## 2018-12-27 LAB — MULTIPLE MYELOMA PANEL, SERUM
Albumin SerPl Elph-Mcnc: 3.4 g/dL (ref 2.9–4.4)
Albumin/Glob SerPl: 0.9 (ref 0.7–1.7)
Alpha 1: 0.2 g/dL (ref 0.0–0.4)
Alpha2 Glob SerPl Elph-Mcnc: 0.7 g/dL (ref 0.4–1.0)
B-Globulin SerPl Elph-Mcnc: 1.4 g/dL — ABNORMAL HIGH (ref 0.7–1.3)
Gamma Glob SerPl Elph-Mcnc: 1.8 g/dL (ref 0.4–1.8)
Globulin, Total: 4.1 g/dL — ABNORMAL HIGH (ref 2.2–3.9)
IgA: 1375 mg/dL — ABNORMAL HIGH (ref 61–437)
IgG (Immunoglobin G), Serum: 1440 mg/dL (ref 700–1600)
IgM (Immunoglobulin M), Srm: 63 mg/dL (ref 15–143)
Total Protein ELP: 7.5 g/dL (ref 6.0–8.5)

## 2019-01-15 ENCOUNTER — Other Ambulatory Visit: Payer: Self-pay

## 2019-01-15 DIAGNOSIS — C9000 Multiple myeloma not having achieved remission: Secondary | ICD-10-CM

## 2019-01-15 MED ORDER — LENALIDOMIDE 2.5 MG PO CAPS: ORAL_CAPSULE | ORAL | 0 refills | Status: DC

## 2019-01-17 ENCOUNTER — Other Ambulatory Visit: Payer: Self-pay | Admitting: Urgent Care

## 2019-01-17 DIAGNOSIS — C9000 Multiple myeloma not having achieved remission: Secondary | ICD-10-CM

## 2019-01-22 ENCOUNTER — Inpatient Hospital Stay: Payer: Medicare Other | Attending: Hematology and Oncology | Admitting: Urgent Care

## 2019-01-22 ENCOUNTER — Inpatient Hospital Stay: Payer: Medicare Other

## 2019-01-22 ENCOUNTER — Other Ambulatory Visit: Payer: Self-pay

## 2019-01-22 ENCOUNTER — Encounter: Payer: Self-pay | Admitting: Urgent Care

## 2019-01-22 VITALS — BP 122/92 | HR 64 | Temp 97.8°F | Resp 18 | Ht 67.0 in | Wt 215.8 lb

## 2019-01-22 DIAGNOSIS — M129 Arthropathy, unspecified: Secondary | ICD-10-CM | POA: Diagnosis not present

## 2019-01-22 DIAGNOSIS — J069 Acute upper respiratory infection, unspecified: Secondary | ICD-10-CM | POA: Diagnosis not present

## 2019-01-22 DIAGNOSIS — R0602 Shortness of breath: Secondary | ICD-10-CM | POA: Insufficient documentation

## 2019-01-22 DIAGNOSIS — E119 Type 2 diabetes mellitus without complications: Secondary | ICD-10-CM | POA: Diagnosis not present

## 2019-01-22 DIAGNOSIS — E538 Deficiency of other specified B group vitamins: Secondary | ICD-10-CM

## 2019-01-22 DIAGNOSIS — R634 Abnormal weight loss: Secondary | ICD-10-CM | POA: Insufficient documentation

## 2019-01-22 DIAGNOSIS — R059 Cough, unspecified: Secondary | ICD-10-CM

## 2019-01-22 DIAGNOSIS — I509 Heart failure, unspecified: Secondary | ICD-10-CM | POA: Insufficient documentation

## 2019-01-22 DIAGNOSIS — Z8042 Family history of malignant neoplasm of prostate: Secondary | ICD-10-CM | POA: Insufficient documentation

## 2019-01-22 DIAGNOSIS — K219 Gastro-esophageal reflux disease without esophagitis: Secondary | ICD-10-CM | POA: Insufficient documentation

## 2019-01-22 DIAGNOSIS — Z8719 Personal history of other diseases of the digestive system: Secondary | ICD-10-CM

## 2019-01-22 DIAGNOSIS — I2510 Atherosclerotic heart disease of native coronary artery without angina pectoris: Secondary | ICD-10-CM | POA: Insufficient documentation

## 2019-01-22 DIAGNOSIS — C9000 Multiple myeloma not having achieved remission: Secondary | ICD-10-CM

## 2019-01-22 DIAGNOSIS — D539 Nutritional anemia, unspecified: Secondary | ICD-10-CM

## 2019-01-22 DIAGNOSIS — E785 Hyperlipidemia, unspecified: Secondary | ICD-10-CM | POA: Insufficient documentation

## 2019-01-22 DIAGNOSIS — Z5181 Encounter for therapeutic drug level monitoring: Secondary | ICD-10-CM

## 2019-01-22 DIAGNOSIS — R5383 Other fatigue: Secondary | ICD-10-CM | POA: Insufficient documentation

## 2019-01-22 DIAGNOSIS — I252 Old myocardial infarction: Secondary | ICD-10-CM | POA: Insufficient documentation

## 2019-01-22 DIAGNOSIS — Z87891 Personal history of nicotine dependence: Secondary | ICD-10-CM | POA: Insufficient documentation

## 2019-01-22 DIAGNOSIS — Z7984 Long term (current) use of oral hypoglycemic drugs: Secondary | ICD-10-CM | POA: Diagnosis not present

## 2019-01-22 DIAGNOSIS — G4730 Sleep apnea, unspecified: Secondary | ICD-10-CM | POA: Insufficient documentation

## 2019-01-22 DIAGNOSIS — Z79899 Other long term (current) drug therapy: Secondary | ICD-10-CM | POA: Insufficient documentation

## 2019-01-22 DIAGNOSIS — R05 Cough: Secondary | ICD-10-CM

## 2019-01-22 DIAGNOSIS — I10 Essential (primary) hypertension: Secondary | ICD-10-CM | POA: Diagnosis not present

## 2019-01-22 DIAGNOSIS — F329 Major depressive disorder, single episode, unspecified: Secondary | ICD-10-CM | POA: Insufficient documentation

## 2019-01-22 DIAGNOSIS — M48061 Spinal stenosis, lumbar region without neurogenic claudication: Secondary | ICD-10-CM

## 2019-01-22 DIAGNOSIS — Z86010 Personal history of colonic polyps: Secondary | ICD-10-CM | POA: Diagnosis not present

## 2019-01-22 DIAGNOSIS — Z7982 Long term (current) use of aspirin: Secondary | ICD-10-CM | POA: Diagnosis not present

## 2019-01-22 DIAGNOSIS — D649 Anemia, unspecified: Secondary | ICD-10-CM

## 2019-01-22 LAB — COMPREHENSIVE METABOLIC PANEL
ALT: 18 U/L (ref 0–44)
AST: 32 U/L (ref 15–41)
Albumin: 3.4 g/dL — ABNORMAL LOW (ref 3.5–5.0)
Alkaline Phosphatase: 63 U/L (ref 38–126)
Anion gap: 8 (ref 5–15)
BUN: 19 mg/dL (ref 8–23)
CO2: 21 mmol/L — ABNORMAL LOW (ref 22–32)
Calcium: 9.2 mg/dL (ref 8.9–10.3)
Chloride: 104 mmol/L (ref 98–111)
Creatinine, Ser: 0.97 mg/dL (ref 0.61–1.24)
GFR calc Af Amer: >60 mL/min (ref >60)
GFR calc non Af Amer: >60 mL/min (ref >60)
Glucose, Bld: 142 mg/dL — ABNORMAL HIGH (ref 70–99)
Potassium: 4.2 mmol/L (ref 3.5–5.1)
Sodium: 133 mmol/L — ABNORMAL LOW (ref 135–145)
Total Bilirubin: 0.8 mg/dL (ref 0.3–1.2)
Total Protein: 8 g/dL (ref 6.5–8.1)

## 2019-01-22 LAB — CBC WITH DIFFERENTIAL/PLATELET
Abs Immature Granulocytes: 0.01 10*3/uL (ref 0.00–0.07)
Basophils Absolute: 0.1 10*3/uL (ref 0.0–0.1)
Basophils Relative: 1 %
Eosinophils Absolute: 0.3 10*3/uL (ref 0.0–0.5)
Eosinophils Relative: 6 %
HCT: 31.9 % — ABNORMAL LOW (ref 39.0–52.0)
Hemoglobin: 11.4 g/dL — ABNORMAL LOW (ref 13.0–17.0)
Immature Granulocytes: 0 %
Lymphocytes Relative: 30 %
Lymphs Abs: 1.2 10*3/uL (ref 0.7–4.0)
MCH: 38.3 pg — ABNORMAL HIGH (ref 26.0–34.0)
MCHC: 35.7 g/dL (ref 30.0–36.0)
MCV: 107 fL — ABNORMAL HIGH (ref 80.0–100.0)
Monocytes Absolute: 0.5 10*3/uL (ref 0.1–1.0)
Monocytes Relative: 11 %
Neutro Abs: 2.1 10*3/uL (ref 1.7–7.7)
Neutrophils Relative %: 52 %
Platelets: 139 10*3/uL — ABNORMAL LOW (ref 150–400)
RBC: 2.98 MIL/uL — ABNORMAL LOW (ref 4.22–5.81)
RDW: 13.7 % (ref 11.5–15.5)
WBC: 4.2 10*3/uL (ref 4.0–10.5)
nRBC: 0 % (ref 0.0–0.2)

## 2019-01-22 MED ORDER — CYANOCOBALAMIN 1000 MCG/ML IJ SOLN
1000.0000 ug | Freq: Once | INTRAMUSCULAR | Status: AC
  Administered 2019-01-22: 1000 ug via INTRAMUSCULAR

## 2019-01-22 NOTE — Progress Notes (Signed)
[facility] [address] [place], [place] [address] Phone: [phone]  Fax: [phone]   Clinic day:  01/22/2019   Chief Complaint: Luis [name]. is a 73 y.o. male with multiple myeloma who is seen for a 4-week assessment on Revlimid.  HPI:  The patient was last seen in the medical oncology clinic on 12/25/2018.  At that time patient felt "pretty good".  He had been off of his Revlimid.  Patient described some fatigue that he attributed to being "stuck in the house".  Patient advising that it was "too cold exercise".  He continued to consume alcohol (2 glasses of wine/day and 2 bourbon shots/day).  Exam is grossly unremarkable.  WBC 3400 ([place] 1700).  Hemoglobin 10.8, hematocrit 30.3, MCV 106.7, and platelets 133,000.  AST elevated at 43 U/L.  SPEP revealed no monoclonal protein.  Additional labs were added to assess patient's underlying anemia.  Reticulocytes 1.6%.  Iron saturation 78 with a TIBC of 302.  Ferritin 212 ng/mL.  In the interim, patient has had minor cold symptoms for the last week.  Patient with a productive cough.  Sputum is green and is intermittently blood-tinged.  Patient denies any associated fevers or myalgias.  He notes that his symptoms have progressively improved since their onset.  Patient treating conservatively at home with OTC intervention, that have been effective for the most part.  He is making a conscious effort to remain adequately hydrated.  He denies any nausea, vomiting, or changes to his bowel habits.  Revlimid has remained on hold since patient's last visit.  Patient advises that he maintains an ok appetite. With the acute respiratory illness, patient has not been eating like he normally does over the course of the past week. Weight today is 215 lb 13.3 oz (97.9 kg), which compared to his last visit to the clinic, represents a 7 pound weight loss. He continues to drink alcohol on a daily basis, but has "cut back some".     Patient complains of arthritic pain rated 3/10 in the clinic today.   Past Medical History:  Diagnosis Date   Angina pectoris ([place])    Anxiety    Arthritis    CHF (congestive heart failure) ([place])    Coronary artery disease    Depression    Diabetes mellitus without complication ([place])    Patient takes Metformin.   Diverticulosis    GERD (gastroesophageal reflux disease)    Hyperlipidemia    Hypertension    Multiple myeloma ([place])    Multiple myeloma ([place]) 03/27/2015   Myocardial infarction [facility]) April 2001   widowmaker   Shortness of breath dyspnea    Sleep apnea    No CPAP @ present   Spinal stenosis     Past Surgical History:  Procedure Laterality Date   CARDIAC CATHETERIZATION     CARPAL TUNNEL RELEASE     CATARACT EXTRACTION     COLONOSCOPY WITH PROPOFOL N/A 11/11/2015   Procedure: COLONOSCOPY WITH PROPOFOL;  Surgeon: Lollie Sails, MD;  Location[facility] ENDOSCOPY;  Service: Endoscopy;  Laterality: N/A;   CORONARY ARTERY BYPASS GRAFT     EYE SURGERY Bilateral    Cataract Extraction   INGUINAL HERNIA REPAIR     JOINT REPLACEMENT Right 2008   Right Total Hip Replacement   PILONIDAL CYST EXCISION     ROTATOR CUFF REPAIR     TOTAL HIP ARTHROPLASTY Right    VENTRAL HERNIA REPAIR N/A 08/15/2015   Procedure: VENTRAL  HERNIA REPAIR WITH MESH ;  Surgeon: Leonie Green, MD;  Location: [facility] ORS;  Service: General;  Laterality: N/A;    Family History  Problem Relation Age of Onset   Heart disease Father    Stroke Mother    Prostate cancer Maternal Grandfather 92    Social History:  reports that he quit smoking about 18 years ago. His smoking use included cigarettes. He has a 60.00 pack-year smoking history. He quit smokeless tobacco use about 18 years ago. He reports that he does not drink alcohol or use drugs.  He drinks 2 glasses wine/day and 2 Bourbon/day.  He notes that his wife goes off to work.  He states that his wife would  like to retire, but that would affect his medication coverage.  He has projects that he likes to do at home.  He does wood working.  He has a class on Wednesdays (wood turning).  His 42nd high school graduation anniversary was in 06/2016.  He has a new grand-daughter, [name].  He had his 50th anniversary.  His wife's name is [name].  The patient is alone today.  Allergies:  Allergies  Allergen Reactions   Pravachol [Pravastatin]    Pravastatin Sodium Other (See Comments)   Statins Other (See Comments)    Muscle aches   Zocor [Simvastatin] Other (See Comments)    Muscle aches    Current Medications: Current Outpatient Medications  Medication Sig Dispense Refill   ALPRAZolam (XANAX) 0.25 MG tablet Take 0.25 mg by mouth at bedtime as needed. for sleep  3   aspirin 81 MG tablet Take 81 mg by mouth daily.      Bee Pollen 500 MG CHEW Chew 1 tablet by mouth daily.     Calcium Carbonate-Vitamin D 600-400 MG-UNIT chew tablet Chew 2 tablets by mouth daily. Takes them throughout the day for a total of 4      Cyanocobalamin (VITAMIN B-12 IJ) Inject 1 Dose as directed every 30 (thirty) days.      ergocalciferol (VITAMIN D2) 50000 UNITS capsule Take 50,000 Units by mouth once a week.     isosorbide mononitrate (IMDUR) 30 MG 24 hr tablet Take 30 mg by mouth daily.     lisinopril (PRINIVIL,ZESTRIL) 20 MG tablet Take 1 tablet by mouth daily.     lovastatin (MEVACOR) 40 MG tablet Take 20 mg by mouth at bedtime. 2 tablets a day     metFORMIN (GLUCOPHAGE) 500 MG tablet 500 mg 2 (two) times daily with a meal.      omeprazole (PRILOSEC) 20 MG capsule Take 20 mg by mouth daily.     Zoledronic Acid (ZOMETA) 4 MG/100ML IVPB Inject into the vein.     amoxicillin (AMOXIL) 500 MG capsule TAKE 4 CAPSULES 1 HOUR PRIOR TO DENTAL APPT.     glucose blood (ONE TOUCH ULTRA TEST) test strip USE ONCE DAILY. USE AS INSTRUCTED. DX E11.9     ibuprofen (ADVIL,MOTRIN) 200 MG tablet Take 200 mg by mouth  every 6 (six) hours as needed (Pt states he takes 3 tablets QHS).     lenalidomide (REVLIMID) 2.5 MG capsule TAKE 1 CAPSULE BY MOUTH ONCE DAILY FOR 21 DAYS ON AND THEN 7 DAYS OFF (Patient not taking: Reported on 01/22/2019) 21 capsule 0   traMADol (ULTRAM) 50 MG tablet TAKE 1 TABLET BY MOUTH THREE TIMES A DAY AS NEEDED (Patient not taking: Reported on 12/25/2018) 60 tablet 0   No current facility-administered medications for this visit.  Review of Systems  Constitutional: Positive for weight loss (down 7 pounds). Negative for diaphoresis, fever and malaise/fatigue.       "I feel ok I guess. I have a cold". Energy stable.   HENT: Positive for congestion (improved since onset).   Eyes: Negative.   Respiratory: Positive for cough, sputum production (green and intermittently blood tinged; improved since onset) and shortness of breath (exertional).        PMH (+) for OSAH syndrome - uses nocturnal PAP therapy.   Cardiovascular: Negative for chest pain, palpitations, orthopnea, leg swelling and PND.  Gastrointestinal: Negative for abdominal pain, blood in stool, constipation, diarrhea, melena, nausea and vomiting.  Genitourinary: Negative for dysuria, frequency, hematuria and urgency.  Musculoskeletal: Positive for back pain (chronic 2/2 spinal stenosis). Negative for falls, joint pain and myalgias.  Skin: Negative for itching and rash.  Neurological: Negative for dizziness, tremors, weakness and headaches.       Requires cane for ambulation  Endo/Heme/Allergies: Bruises/bleeds easily.       PMH (+) for diabetes  Psychiatric/Behavioral: Negative for depression, memory loss and suicidal ideas. The patient is not nervous/anxious and does not have insomnia.   All other systems reviewed and are negative.  Performance status (ECOG): 1 - Symptomatic but completely ambulatory  Vital Signs BP (!) 122/92 (BP Location: Left Arm, Patient Position: Sitting)    Pulse 64    Temp 97.8 F (36.6 C)    Resp  18    Ht _0  (1.702 m)    Wt 215 lb 13.3 oz (97.9 kg)    SpO2 98%    BMI 33.80 kg/m   Physical Exam  Constitutional: He is oriented to person, place, and time and well-developed, well-nourished, and in no distress.  HENT:  Head: Normocephalic and atraumatic.  Nose: Mucosal edema and rhinorrhea present. No sinus tenderness.  Mouth/Throat: Oropharynx is clear and moist and mucous membranes are normal.  Eyes: Pupils are equal, round, and reactive to light. EOM are normal. No scleral icterus.  Neck: Normal range of motion. Neck supple. No tracheal deviation present. No thyromegaly present.  Cardiovascular: Normal rate, regular rhythm, normal heart sounds and intact distal pulses. Exam reveals no gallop and no friction rub.  No murmur heard. Pulmonary/Chest: Effort normal and breath sounds normal. No respiratory distress. He has no wheezes. He has no rales.  Abdominal: Soft. Bowel sounds are normal. He exhibits no distension. There is no abdominal tenderness.  Musculoskeletal: Normal range of motion.        General: No tenderness or edema.  Lymphadenopathy:    He has no cervical adenopathy.    He has no axillary adenopathy.       Right: No inguinal and no supraclavicular adenopathy present.       Left: No inguinal and no supraclavicular adenopathy present.  Neurological: He is alert and oriented to person, place, and time.  Skin: Skin is warm and dry. No rash noted. No erythema.  Psychiatric: Mood, affect and judgment normal.  Nursing note and vitals reviewed.   Appointment on 01/22/2019  Component Date Value Ref Range Status   Sodium 01/22/2019 133* 135 - 145 mmol/L Final   Potassium 01/22/2019 4.2  3.5 - 5.1 mmol/L Final   Chloride 01/22/2019 104  98 - 111 mmol/L Final   CO2 01/22/2019 21* 22 - 32 mmol/L Final   Glucose, Bld 01/22/2019 142* 70 - 99 mg/dL Final   BUN 01/22/2019 19  8 - 23 mg/dL Final   Creatinine,  Ser 01/22/2019 0.97  0.61 - 1.24 mg/dL Final   Calcium  01/22/2019 9.2  8.9 - 10.3 mg/dL Final   Total Protein 01/22/2019 8.0  6.5 - 8.1 g/dL Final   Albumin 01/22/2019 3.4* 3.5 - 5.0 g/dL Final   AST 01/22/2019 32  15 - 41 U/L Final   ALT 01/22/2019 18  0 - 44 U/L Final   Alkaline Phosphatase 01/22/2019 63  38 - 126 U/L Final   Total Bilirubin 01/22/2019 0.8  0.3 - 1.2 mg/dL Final   GFR calc non Af Amer 01/22/2019 >60  >60 mL/min Final   GFR calc Af Amer 01/22/2019 >60  >60 mL/min Final   Anion gap 01/22/2019 8  5 - 15 Final   Performed at [facility], [address], [place], [place] [address]   WBC 01/22/2019 4.2  4.0 - 10.5 K/uL Final   RBC 01/22/2019 2.98* 4.22 - 5.81 MIL/uL Final   Hemoglobin 01/22/2019 11.4* 13.0 - 17.0 g/dL Final   HCT 01/22/2019 31.9* 39.0 - 52.0 % Final   MCV 01/22/2019 107.0* 80.0 - 100.0 fL Final   MCH 01/22/2019 38.3* 26.0 - 34.0 pg Final   MCHC 01/22/2019 35.7  30.0 - 36.0 g/dL Final   RDW 01/22/2019 13.7  11.5 - 15.5 % Final   Platelets 01/22/2019 139* 150 - 400 K/uL Final   nRBC 01/22/2019 0.0  0.0 - 0.2 % Final   Neutrophils Relative % 01/22/2019 52  % Final   Neutro Abs 01/22/2019 2.1  1.7 - 7.7 K/uL Final   Lymphocytes Relative 01/22/2019 30  % Final   Lymphs Abs 01/22/2019 1.2  0.7 - 4.0 K/uL Final   Monocytes Relative 01/22/2019 11  % Final   Monocytes Absolute 01/22/2019 0.5  0.1 - 1.0 K/uL Final   Eosinophils Relative 01/22/2019 6  % Final   Eosinophils Absolute 01/22/2019 0.3  0.0 - 0.5 K/uL Final   Basophils Relative 01/22/2019 1  % Final   Basophils Absolute 01/22/2019 0.1  0.0 - 0.1 K/uL Final   Immature Granulocytes 01/22/2019 0  % Final   Abs Immature Granulocytes 01/22/2019 0.01  0.00 - 0.07 K/uL Final   Performed at [facility], [address], [place], [place] [address]    Assessment:  Luis [name]. is a 73 y.o. male with stage III multiple myeloma. He presented in 02/2013 with left-sided sharp pain. Evaluation revealed  wide-spread lytic lesions including fracture of the left 5th rib laterally. CT scans on 02/22/2013 showed innumerable lytic lesions in thoracic spine, sternum, clavicle, scapula, and ribs.   Bone marrow biopsy on 03/13/2013 revealed 15 to 20% plasma cells. Iron stores were absent. Renal function was normal. SPEP on 03/01/2013 revealed a 1.4 gm/dL IgG monoclonal lambda. IgG was 1987.   He began induction with Revlimid 25 mg a day (3 weeks on and 1 week off) and Decadron (40 mg once a week) on 04/09/2013. SPEP has revealed no monoclonal protein since 07/26/2014 (last check 12/15/2018). IgG was 1362 on 07/26/2014 and 1097 on 12/19/2014.   Lambda free light chains have been monitored: 33.20 (ratio of 1.56) on 09/20/2014, 31.51 (ratio of 1.43) on 12/19/2014, 30.25 (ratio of 1.56) on 05/01/2015, 31.95 (ratio 1.49) on 01/01/2016, 28.02 (ratio 1.29) on 03/25/2016, 31.5 (ratio 1.26) on 05/28/2016, 21.8 (ratio 1.35) on 08/20/2016, 37.3 (ratio 1.09) on 10/22/2016, 46.3 (ratio 1.49) on 10/21/2017, 54 (ratio 1.84) on 04/14/2018, 66.9 (ratio 1.64) on 11/13/2018.  24 hour UPEP on 12/02/2015 revealed no  monoclonal protein.  With remission, Revlimid was decreased to 10 mg a day then to 10 mg every other day secondary to issues with renal function and diarrhea.  Current Revlimid is 2.5 mg a day (3 weeks on/1 week off).  Last cycle of Revlimid started on 10/27/2018.  Bone survey on 05/30/2015 revealed the majority of small lytic lesions were stable.  There was possibly new lesion in the distal left clavicle and right mid femur (small) and a possible developing lucency in the proximal left femoral shaft.  The calvarial lesion noted on the prior study was not well seen (? positional).  Bone survey on 11/27/2015 revealed widespread bony lytic lesions consistent with multiple myeloma.  The vast majority of lesions were stable.  A few small lesions were not previously seen.  One lesion was previously obscured by bowel  contents.  Two small lesions in the right distal femoral diaphysis appeared new.  Bone survey on 05/25/2016 revealed no evidence of progressive myelomatous involvement of the skeleton.  Bone survey on 05/16/2017 revealed multiple lucent lesions as previously seen. There was no convincing evidence of progression or superimposed abnormality since prior study.  Bone survey on 06/01/2018 revealed scattered lucent lesions, with no evidence for progression.   He received Zometa every 3 months (last 02/28/2017). He takes calcium 4 pills/day. He receives B12 every 6 weeks (last 12/25/2018). B12 was 370 on 01/17/2015 and 493 on 10/13/2018.  Folate was 17.5 on 09/17/2016 and 35 on 10/13/2018.  TSH was normal on 07/07/2018.  Abdominal and pelvic CT scan on 07/15/2015 revealed  extensive sigmoid diverticulosis. There was questionable wall thickening of the ascending colon versus artifact from incomplete distention.  Colonoscopy on 11/11/2015 revealed diverticulosis in the sigmoid colon and distal descending colon and ascending colon.  There was a 2 mm polyp in the mid sigmoid colon.  Pathology revealed a hyperplastic polyp, negative for dysplasia or malignancy.  Symptomatically, patient presents today with cold symptoms.  He has a productive cough with green sputum production.  Sputum intermittently blood-tinged.  He denies any fevers.  Cough being conservatively treated with OTC interventions; effective.  He denies any B symptoms.  No bruising, bleeding, or areas of palpable adenopathy.  Exam reveals nasal congestion with no associated posterior pharyngeal irritation.  WBC 4200 ([place] 2100).  Hemoglobin 11.4, hematocrit 31.9, MCV 107.0, and platelets 139,000.  Sodium remains low at 133 mmol/L.  LFTs normal.   Plan: 1. Labs today:  CBC with diff, CMP 2. Multiple myeloma  Doing well overall.  SPEP has been stable with no evidence of a monoclonal protein.  Counts have improved; WBC 4200 ([place] 2100).  Has  remained off Revlimid due to counts.  Has reduced daily ETOH consumption, which was felt to be contributory.  Anticipate bone marrow sampling if counts do not remain adequate off alcohol.  Secondary to acute illness, will have patient remain off of Revlimid until fully recovered.  Next bone survey due on 06/02/2019. 3. Acute URI  Symptoms x1 week.  No fevers or increased SOB. No recent travel.   Cough with green sputum production; intermittently blood-tinged.  Discussed further assessment, however patient notes that he feels like he has improved since symptoms started.  Lungs clear - reasonable to forego CXR at this point.   Given that symptoms are improving, the fact that he has no fever, it is reasonable to hold off on antibiotic coverage at this time.  Patient advised to return to call to the clinic for any new  or worsening symptoms, at which time would consider covering with ABX.  4. Macrocytic anemia  Labs reviewed.  Hemoglobin 11.4, hematocrit 31.9, MCV 107.0, and platelets 139.  Secondary work-up negative.  Reticulocytes, iron studies, and ferritin all normal.  Has reduced daily alcohol consumption.  Discuss potential etiology as being related to his multiple myeloma, however suspicion is low.  Patient has been off of his Revlimid, therefore medication felt to be noncontributory.  Discuss potential underlying MDS, which will require bone marrow sampling to further assess. 5. B12 deficiency  Continues on parenteral B12 every 4-6 weeks (last 12/25/2018).  B12 injection today, then every 4-6 weeks as previously prescribed. 6. RTC in 4 weeks for MD assessment, labs (CBC with diff, CMP), B12, and consideration of reinitiation of Revlimid.     Honor Loh, NP 01/22/2019, 11:21 AM

## 2019-01-25 ENCOUNTER — Telehealth: Payer: Self-pay

## 2019-01-25 ENCOUNTER — Other Ambulatory Visit: Payer: Self-pay | Admitting: Urgent Care

## 2019-01-25 MED ORDER — BENZONATATE 200 MG PO CAPS: 200.0000 mg | ORAL_CAPSULE | Freq: Three times a day (TID) | ORAL | 0 refills | Status: DC | PRN

## 2019-01-25 NOTE — Telephone Encounter (Signed)
-----   Message from Karen Kitchens, NP sent at 01/25/2019  2:48 PM EDT ----- Contact: [phone] Call him. Determine symptom constellation. Mainly interested in whether or not a fever has declared. Ready to accept ABX? Has he ever had Tussionex? I am willing to Rx....just make sure he is educated on the side effects. Ensure proper hydration.  Gaspar Bidding ----- Message ----- From: Secundino Ginger Sent: 01/25/2019   2:45 PM EDT To: Karen Kitchens, NP  Camila Li has had a cold and cough for 2 weeks. He said you would call something in for him for cough. He is still coughing. He mentioned Tussinex [facility]

## 2019-01-25 NOTE — Telephone Encounter (Signed)
-----   Message from Karen Kitchens, NP sent at 01/25/2019  2:48 PM EDT ----- Contact: [phone] Call him. Determine symptom constellation. Mainly interested in whether or not a fever has declared. Ready to accept ABX? Has he ever had Tussionex? I am willing to Rx....just make sure he is educated on the side effects. Ensure proper hydration.  Luis [name] ----- Message ----- From: Secundino Ginger Sent: 01/25/2019   2:45 PM EDT To: Karen Kitchens, NP  Luis [name] has had a cold and cough for 2 weeks. He said you would call something in for him for cough. He is still coughing. He mentioned Tussinex [facility]

## 2019-01-25 NOTE — Telephone Encounter (Signed)
Attempted to contact patient to inquire of symptoms. VM left requesting callback.

## 2019-01-25 NOTE — Telephone Encounter (Signed)
Patient returned call. States cough has been present, x 2 weeks but is improving since being seen by Honor Loh, NP on Monday. Reports cough is productive but is now clear in color compared to dark green. Patient remains afebrile with symptoms. Reviewed SE on Tussionex with patient and patient declines offer due to possible SE. Informed patient after reviewing with Honor Loh, NP that Ladona Ridgel would be sent to pharmacy. Patient instructed to call back or call PCP with any new or worsening symptoms and encouraged to hydrate. Patient verbalizes understanding and denies any concerns at this time.

## 2019-02-05 ENCOUNTER — Ambulatory Visit: Payer: PRIVATE HEALTH INSURANCE

## 2019-02-18 ENCOUNTER — Other Ambulatory Visit: Payer: Self-pay | Admitting: Hematology and Oncology

## 2019-02-18 DIAGNOSIS — R790 Abnormal level of blood mineral: Secondary | ICD-10-CM | POA: Insufficient documentation

## 2019-02-18 NOTE — [place] Notes (Signed)
[facility]  [address], [address] Luis [name], [place] [address] Phone: [phone]  Fax: [phone]   Telemedicine Office Visit:  02/19/2019  Referring physician: Tracie Harrier, MD  I connected with Luis [name]. on 02/19/19 at 1:44 PM EDT by videoconferencing and verified that I was speaking with the correct person using 2 identifiers.  The patient was at home.  I discussed the limitations, risk, security and privacy concerns of performing an evaluation and management service by videoconferencing and  the availability of in person appointments.  I also discussed with the patient that there may be a patient responsible charge related to this service.  The patient expressed understanding and agreed to proceed.    Chief Complaint: Luis Kilgallon. is a 73 y.o. male with multiple myeloma who is seen for a 4-week assessment on Revlimid.  HPI:  The patient was last seen in the medical oncology clinic on 01/22/2019 by Honor Loh, NP.  At that time, he had cold symptoms.  He had a productive cough with green sputum production.  Sputum was intermittently blood-tinged.  He denied any fevers.  Cough was being conservatively treated with OTC interventions; effective.  He denied any B symptoms.  No bruising, bleeding, or areas of palpable adenopathy.  Exam revealed  nasal congestion with no associated posterior pharyngeal irritation.  WBC was 4200 ([place] 2100).  Hemoglobin was 11.4, hematocrit 31.9, MCV 107.0, and platelets 139,000.  Sodium was 133 mmol/L.  LFTs were normal.    Ferritin was 212.  Iron saturation was 78% with a TIBC of 302 on 12/25/2018.  He received a B12 injection on 01/12/2019  During the interim, he notes that his cold is gone.  He feels good.  He continues to sleep a lot.  He feels sleepy a lot during the day.  He is using his CPAP 90% of the time.  Sometimes it leaks and is tight and uncomfortable.  He continues to have pain from spinal stenosis.  He  has an appointment with Dr. Sharlet Salina for an injection.  He is trying to watch his weight.  He is eating more fruit.  Weight is stable.  He denies any infections.   Past Medical History:  Diagnosis Date   Angina pectoris ([place])    Anxiety    Arthritis    CHF (congestive heart failure) ([place])    Coronary artery disease    Depression    Diabetes mellitus without complication ([place])    Patient takes Metformin.   Diverticulosis    GERD (gastroesophageal reflux disease)    Hyperlipidemia    Hypertension    Multiple myeloma ([place])    Multiple myeloma ([place]) 03/27/2015   Myocardial infarction [facility]) April 2001   widowmaker   Shortness of breath dyspnea    Sleep apnea    No CPAP @ present   Spinal stenosis     Past Surgical History:  Procedure Laterality Date   CARDIAC CATHETERIZATION     CARPAL TUNNEL RELEASE     CATARACT EXTRACTION     COLONOSCOPY WITH PROPOFOL N/A 11/11/2015   Procedure: COLONOSCOPY WITH PROPOFOL;  Surgeon: Lollie Sails, MD;  Location: [facility] ENDOSCOPY;  Service: Endoscopy;  Laterality: N/A;   CORONARY ARTERY BYPASS GRAFT     EYE SURGERY Bilateral    Cataract Extraction   INGUINAL HERNIA REPAIR     JOINT REPLACEMENT Right 2008   Right Total Hip Replacement   PILONIDAL CYST EXCISION  ROTATOR CUFF REPAIR     TOTAL HIP ARTHROPLASTY Right    VENTRAL HERNIA REPAIR N/A 08/15/2015   Procedure: VENTRAL HERNIA REPAIR WITH MESH ;  Surgeon: Leonie Green, MD;  Location: [facility] ORS;  Service: General;  Laterality: N/A;    Family History  Problem Relation Age of Onset   Heart disease Father    Stroke Mother    Prostate cancer Maternal Grandfather 17    Social History:  reports that he quit smoking about 19 years ago. His smoking use included cigarettes. He has a 60.00 pack-year smoking history. He quit smokeless tobacco use about 18 years ago. He reports that he does not drink alcohol or use drugs.  He drinks 2 glasses wine/day and 2  Bourbon/day.  He notes that his wife goes off to work.  He states that his wife would like to retire, but that would affect his medication coverage.  He has projects that he likes to do at home.  He does wood working.  He has a class on Wednesdays (wood turning).  His 42nd high school graduation anniversary was in 06/2016.  He has a new grand-daughter, [name].  He had his 50th anniversary.  His wife's name is [name].  The patient is alone today.  Participants in the patient's visit and their role in the encounter included the patient, his wife, and [place], Therapist, sports, today.  The intake visit was provided by Waymon Budge, RN.    Allergies:  Allergies  Allergen Reactions   Pravachol [Pravastatin]    Pravastatin Sodium Other (See Comments)   Statins Other (See Comments)    Muscle aches   Zocor [Simvastatin] Other (See Comments)    Muscle aches    Current Medications: Current Outpatient Medications  Medication Sig Dispense Refill   ALPRAZolam (XANAX) 0.25 MG tablet Take 0.25 mg by mouth at bedtime as needed. for sleep  3   aspirin 81 MG tablet Take 81 mg by mouth daily.      Bee Pollen 500 MG CHEW Chew 1 tablet by mouth 2 (two) times daily.      Calcium Carbonate-Vitamin D 600-400 MG-UNIT chew tablet Chew 2 tablets by mouth daily.      Cyanocobalamin (VITAMIN B-12 IJ) Inject 1 Dose as directed every 30 (thirty) days.      ergocalciferol (VITAMIN D2) 50000 UNITS capsule Take 50,000 Units by mouth once a week.     glucose blood (ONE TOUCH ULTRA TEST) test strip USE ONCE DAILY. USE AS INSTRUCTED. DX E11.9     lisinopril (PRINIVIL,ZESTRIL) 20 MG tablet Take 1 tablet by mouth daily.     lovastatin (MEVACOR) 40 MG tablet Take 20 mg by mouth at bedtime.      metFORMIN (GLUCOPHAGE) 500 MG tablet 500 mg 2 (two) times daily with a meal.      omeprazole (PRILOSEC) 20 MG capsule Take 20 mg by mouth daily.     traMADol (ULTRAM) 50 MG tablet TAKE 1 TABLET BY MOUTH THREE TIMES  A DAY AS NEEDED 60 tablet 0   amoxicillin (AMOXIL) 500 MG capsule TAKE 4 CAPSULES 1 HOUR PRIOR TO DENTAL APPT.     isosorbide mononitrate (IMDUR) 30 MG 24 hr tablet Take 30 mg by mouth daily.     lenalidomide (REVLIMID) 2.5 MG capsule TAKE 1 CAPSULE BY MOUTH ONCE DAILY FOR 21 DAYS ON AND THEN 7 DAYS OFF (Patient not taking: Reported on 01/22/2019) 21 capsule 0   No current facility-administered medications for this visit.  Review of Systems  Constitutional: Negative.  Negative for chills, diaphoresis, fever, malaise/fatigue and weight loss (stable; trying to lose weight).       Feels "good".  HENT: Negative for congestion, ear discharge, ear pain, nosebleeds, sinus pain and sore throat.   Eyes: Negative.  Negative for blurred vision, double vision, photophobia and pain.  Respiratory: Negative for cough, hemoptysis, sputum production and shortness of breath.        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   Cardiovascular: Negative.  Negative for palpitations, orthopnea, leg swelling and PND.  Gastrointestinal: Negative.  Negative for abdominal pain, blood in stool, constipation, diarrhea, melena, nausea and vomiting.  Genitourinary: Negative.  Negative for dysuria, frequency, hematuria and urgency.  Musculoskeletal: Positive for back pain (spinal stenosis- receives injections). Negative for falls, joint pain, myalgias and neck pain.  Skin: Negative.  Negative for itching and rash.  Neurological: Negative for dizziness, tremors, speech change, focal weakness, weakness and headaches.       Requires cane for ambulation.  Endo/Heme/Allergies: Does not bruise/bleed easily.       Diabetes.  Psychiatric/Behavioral: Negative for depression and memory loss. The patient is not nervous/anxious and does not have insomnia (daytime sleepiness).   All other systems reviewed and are negative.  Performance status (ECOG): 1  Vital Signs There were no vitals taken for this visit.  Physical Exam    Constitutional: He is oriented to person, place, and time and well-developed, well-nourished, and in no distress.  Elderly gentleman sitting comfortably in his home in no acute distress.  HENT:  Head: Normocephalic and atraumatic.  Nose: Mucosal edema and rhinorrhea present. No sinus tenderness.  Short white hair.  Male pattern baldness.  Eyes: Conjunctivae and EOM are normal. No scleral icterus.  Neurological: He is alert and oriented to person, place, and time.  Skin: He is not diaphoretic.  Psychiatric: Affect and judgment normal.    No visits with results within 3 Day(s) from this visit.  Latest known visit with results is:  Appointment on 01/22/2019  Component Date Value Ref Range Status   Sodium 01/22/2019 133* 135 - 145 mmol/L Final   Potassium 01/22/2019 4.2  3.5 - 5.1 mmol/L Final   Chloride 01/22/2019 104  98 - 111 mmol/L Final   CO2 01/22/2019 21* 22 - 32 mmol/L Final   Glucose, Bld 01/22/2019 142* 70 - 99 mg/dL Final   BUN 01/22/2019 19  8 - 23 mg/dL Final   Creatinine, Ser 01/22/2019 0.97  0.61 - 1.24 mg/dL Final   Calcium 01/22/2019 9.2  8.9 - 10.3 mg/dL Final   Total Protein 01/22/2019 8.0  6.5 - 8.1 g/dL Final   Albumin 01/22/2019 3.4* 3.5 - 5.0 g/dL Final   AST 01/22/2019 32  15 - 41 U/L Final   ALT 01/22/2019 18  0 - 44 U/L Final   Alkaline Phosphatase 01/22/2019 63  38 - 126 U/L Final   Total Bilirubin 01/22/2019 0.8  0.3 - 1.2 mg/dL Final   GFR calc non Af Amer 01/22/2019 >60  >60 mL/min Final   GFR calc Af Amer 01/22/2019 >60  >60 mL/min Final   Anion gap 01/22/2019 8  5 - 15 Final   Performed at [facility], [address] [place], [place] [address]   WBC 01/22/2019 4.2  4.0 - 10.5 K/uL Final   RBC 01/22/2019 2.98* 4.22 - 5.81 MIL/uL Final   Hemoglobin 01/22/2019 11.4* 13.0 - 17.0 g/dL Final   HCT 01/22/2019 31.9*  39.0 - 52.0 % Final   MCV 01/22/2019 107.0* 80.0 - 100.0 fL Final   MCH 01/22/2019 38.3* 26.0 - 34.0 pg  Final   MCHC 01/22/2019 35.7  30.0 - 36.0 g/dL Final   RDW 01/22/2019 13.7  11.5 - 15.5 % Final   Platelets 01/22/2019 139* 150 - 400 K/uL Final   nRBC 01/22/2019 0.0  0.0 - 0.2 % Final   Neutrophils Relative % 01/22/2019 52  % Final   Neutro Abs 01/22/2019 2.1  1.7 - 7.7 K/uL Final   Lymphocytes Relative 01/22/2019 30  % Final   Lymphs Abs 01/22/2019 1.2  0.7 - 4.0 K/uL Final   Monocytes Relative 01/22/2019 11  % Final   Monocytes Absolute 01/22/2019 0.5  0.1 - 1.0 K/uL Final   Eosinophils Relative 01/22/2019 6  % Final   Eosinophils Absolute 01/22/2019 0.3  0.0 - 0.5 K/uL Final   Basophils Relative 01/22/2019 1  % Final   Basophils Absolute 01/22/2019 0.1  0.0 - 0.1 K/uL Final   Immature Granulocytes 01/22/2019 0  % Final   Abs Immature Granulocytes 01/22/2019 0.01  0.00 - 0.07 K/uL Final   Performed at [facility], [address] [place], [place] [address]    Assessment:  Luis Borjon. is a 73 y.o. male with stage III multiple myeloma. He presented in 02/2013 with left-sided sharp pain. Evaluation revealed wide-spread lytic lesions including fracture of the left 5th rib laterally. CT scans on 02/22/2013 showed innumerable lytic lesions in thoracic spine, sternum, clavicle, scapula, and ribs.   Bone marrow biopsy on 03/13/2013 revealed 15 to 20% plasma cells. Iron stores were absent. Renal function was normal. SPEP on 03/01/2013 revealed a 1.4 gm/dL IgG monoclonal lambda. IgG was 1987.   He began induction with Revlimid 25 mg a day (3 weeks on and 1 week off) and Decadron (40 mg once a week) on 04/09/2013. SPEP has revealed no monoclonal protein since 07/26/2014 (last check 12/25/2018). IgG was 1362 on 07/26/2014 and 1097 on 12/19/2014.   Lambda free light chains have been monitored: 33.20 (ratio of 1.56) on 09/20/2014, 31.51 (ratio of 1.43) on 12/19/2014, 30.25 (ratio of 1.56) on 05/01/2015, 31.95 (ratio 1.49) on 01/01/2016, 28.02 (ratio  1.29) on 03/25/2016, 31.5 (ratio 1.26) on 05/28/2016, 21.8 (ratio 1.35) on 08/20/2016, 37.3 (ratio 1.09) on 10/22/2016, 46.3 (ratio 1.49) on 10/21/2017, 54 (ratio 1.84) on 04/14/2018, 66.9 (ratio 1.64) on 11/13/2018.  24 hour UPEP on 12/02/2015 revealed no monoclonal protein.  With remission, Revlimid was decreased to 10 mg a day then to 10 mg every other day secondary to issues with renal function and diarrhea.  Current Revlimid is 2.5 mg a day (3 weeks on/1 week off).  Last cycle of Revlimid started on 10/27/2018.  Bone survey on 05/30/2015 revealed the majority of small lytic lesions were stable.  There was possibly new lesion in the distal left clavicle and right mid femur (small) and a possible developing lucency in the proximal left femoral shaft.  The calvarial lesion noted on the prior study was not well seen (? positional).  Bone survey on 11/27/2015 revealed widespread bony lytic lesions consistent with multiple myeloma.  The vast majority of lesions were stable.  A few small lesions were not previously seen.  One lesion was previously obscured by bowel contents.  Two small lesions in the right distal femoral diaphysis appeared new.  Bone survey on 05/25/2016 revealed no evidence of progressive myelomatous involvement of the skeleton.  Bone survey  on 05/16/2017 revealed multiple lucent lesions as previously seen. There was no convincing evidence of progression or superimposed abnormality since prior study.  Bone survey on 06/01/2018 revealed scattered lucent lesions, with no evidence for progression.   He received Zometa every 3 months (last 02/28/2017). He takes calcium 4 pills/day. He receives B12 every 6 weeks (last 12/25/2018). B12 was 370 on 01/17/2015 and 493 on 10/13/2018.  Folate was 17.5 on 09/17/2016 and 35 on 10/13/2018.  TSH was normal on 07/07/2018.  Abdominal and pelvic CT scan on 07/15/2015 revealed  extensive sigmoid diverticulosis. There was questionable wall thickening of  the ascending colon versus artifact from incomplete distention.  Colonoscopy on 11/11/2015 revealed diverticulosis in the sigmoid colon and distal descending colon and ascending colon.  There was a 2 mm polyp in the mid sigmoid colon.  Pathology revealed a hyperplastic polyp, negative for dysplasia or malignancy.  Symptomatically, cold symptoms has resolved.  He continues to note daytime sleepiness.  He is using his CPAP.  He has chronic back pain from spinal stenosis.  Plan: 1. Labs today:  CBC with diff, CMP, SPEP, FLCA. 2. Multiple myeloma Clinically doing well. SPEP negative. Currently on Revlimid hold x 1 month Check counts tomorrow and reinstitution of Revlimid if ANC adequate. Bone survey on 06/02/2019. 3. Macrocytic anemia Etiology unclear. B12, folate, and TSH normal. 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 If macrocytosis persists with low counts, consider repeat bone marrow after COVID-19 pandemic. 4. B12 deficiency Patient on monthly B12 (last 01/22/2019). Folate normal on 10/13/2018. 5.   RTC tomorrow for labs and B12. 6.   RN or MD to call patient with CBC results tomorrow re: start of next Revlimid cycle. 7.   RTC in 5 weeks for MD assessment, labs (CBC with diff, CMP, SPEP), and B12.    I discussed the assessment and treatment plan with the patient.  The patient was provided an opportunity to ask questions and all were answered.  The patient agreed with the plan and demonstrated an understanding of the instructions.  The patient was advised to call back or seek an in person evaluation if the symptoms worsen or if the condition fails to improve as anticipated.  I provided 18 minutes (1:44 PM - 2:02 PM) of non-face-to-face time during this encounter.  I provided these services from the [facility] office.   Lequita Asal, MD, PhD  02/19/2019, 1:44 PM

## 2019-02-19 ENCOUNTER — Inpatient Hospital Stay: Payer: Medicare Other | Attending: Hematology and Oncology | Admitting: Hematology and Oncology

## 2019-02-19 ENCOUNTER — Ambulatory Visit: Payer: PRIVATE HEALTH INSURANCE

## 2019-02-19 ENCOUNTER — Encounter: Payer: Self-pay | Admitting: Hematology and Oncology

## 2019-02-19 ENCOUNTER — Other Ambulatory Visit: Payer: PRIVATE HEALTH INSURANCE

## 2019-02-19 DIAGNOSIS — C9000 Multiple myeloma not having achieved remission: Secondary | ICD-10-CM | POA: Insufficient documentation

## 2019-02-19 DIAGNOSIS — R790 Abnormal level of blood mineral: Secondary | ICD-10-CM

## 2019-02-19 DIAGNOSIS — E538 Deficiency of other specified B group vitamins: Secondary | ICD-10-CM

## 2019-02-19 NOTE — [place] Notes (Signed)
Confirmed name, DOB, address. Patient questioning when he will start Revlimid again.

## 2019-02-20 ENCOUNTER — Inpatient Hospital Stay: Payer: Medicare Other

## 2019-02-20 ENCOUNTER — Other Ambulatory Visit: Payer: Self-pay

## 2019-02-20 ENCOUNTER — Other Ambulatory Visit: Payer: Self-pay | Admitting: Hematology and Oncology

## 2019-02-20 VITALS — BP 122/78 | HR 64 | Temp 97.8°F | Resp 18

## 2019-02-20 DIAGNOSIS — E538 Deficiency of other specified B group vitamins: Secondary | ICD-10-CM

## 2019-02-20 DIAGNOSIS — C9000 Multiple myeloma not having achieved remission: Secondary | ICD-10-CM

## 2019-02-20 DIAGNOSIS — R790 Abnormal level of blood mineral: Secondary | ICD-10-CM

## 2019-02-20 LAB — COMPREHENSIVE METABOLIC PANEL WITH GFR
ALT: 19 U/L (ref 0–44)
AST: 33 U/L (ref 15–41)
Albumin: 3.4 g/dL — ABNORMAL LOW (ref 3.5–5.0)
Alkaline Phosphatase: 70 U/L (ref 38–126)
Anion gap: 6 (ref 5–15)
BUN: 22 mg/dL (ref 8–23)
CO2: 23 mmol/L (ref 22–32)
Calcium: 9.4 mg/dL (ref 8.9–10.3)
Chloride: 106 mmol/L (ref 98–111)
Creatinine, Ser: 0.98 mg/dL (ref 0.61–1.24)
GFR calc Af Amer: >60 mL/min
GFR calc non Af Amer: >60 mL/min
Glucose, Bld: 222 mg/dL — ABNORMAL HIGH (ref 70–99)
Potassium: 4.5 mmol/L (ref 3.5–5.1)
Sodium: 135 mmol/L (ref 135–145)
Total Bilirubin: 0.8 mg/dL (ref 0.3–1.2)
Total Protein: 8 g/dL (ref 6.5–8.1)

## 2019-02-20 LAB — CBC WITH DIFFERENTIAL/PLATELET
Abs Immature Granulocytes: 0.01 10*3/uL (ref 0.00–0.07)
Basophils Absolute: 0 10*3/uL (ref 0.0–0.1)
Basophils Relative: 1 %
Eosinophils Absolute: 0.1 10*3/uL (ref 0.0–0.5)
Eosinophils Relative: 4 %
HCT: 30.3 % — ABNORMAL LOW (ref 39.0–52.0)
Hemoglobin: 10.6 g/dL — ABNORMAL LOW (ref 13.0–17.0)
Immature Granulocytes: 0 %
Lymphocytes Relative: 34 %
Lymphs Abs: 1 10*3/uL (ref 0.7–4.0)
MCH: 38.8 pg — ABNORMAL HIGH (ref 26.0–34.0)
MCHC: 35 g/dL (ref 30.0–36.0)
MCV: 111 fL — ABNORMAL HIGH (ref 80.0–100.0)
Monocytes Absolute: 0.3 10*3/uL (ref 0.1–1.0)
Monocytes Relative: 11 %
Neutro Abs: 1.5 10*3/uL — ABNORMAL LOW (ref 1.7–7.7)
Neutrophils Relative %: 50 %
Platelets: 126 10*3/uL — ABNORMAL LOW (ref 150–400)
RBC: 2.73 MIL/uL — ABNORMAL LOW (ref 4.22–5.81)
RDW: 14 % (ref 11.5–15.5)
WBC: 3 10*3/uL — ABNORMAL LOW (ref 4.0–10.5)
nRBC: 0 % (ref 0.0–0.2)

## 2019-02-20 LAB — IRON AND TIBC
Iron: 163 ug/dL (ref 45–182)
Saturation Ratios: 56 % — ABNORMAL HIGH (ref 17.9–39.5)
TIBC: 293 ug/dL (ref 250–450)
UIBC: 130 ug/dL

## 2019-02-20 MED ORDER — CYANOCOBALAMIN 1000 MCG/ML IJ SOLN
1000.0000 ug | Freq: Once | INTRAMUSCULAR | Status: AC
  Administered 2019-02-20: 1000 ug via INTRAMUSCULAR

## 2019-02-21 ENCOUNTER — Telehealth: Payer: Self-pay | Admitting: Hematology and Oncology

## 2019-02-21 ENCOUNTER — Telehealth: Payer: Self-pay

## 2019-02-21 LAB — PROTEIN ELECTROPHORESIS, SERUM
A/G Ratio: 0.8 (ref 0.7–1.7)
Albumin ELP: 3.4 g/dL (ref 2.9–4.4)
Alpha-1-Globulin: 0.3 g/dL (ref 0.0–0.4)
Alpha-2-Globulin: 0.6 g/dL (ref 0.4–1.0)
Beta Globulin: 1.5 g/dL — ABNORMAL HIGH (ref 0.7–1.3)
Gamma Globulin: 1.8 g/dL (ref 0.4–1.8)
Globulin, Total: 4.1 g/dL — ABNORMAL HIGH (ref 2.2–3.9)
Total Protein ELP: 7.5 g/dL (ref 6.0–8.5)

## 2019-02-21 LAB — KAPPA/LAMBDA LIGHT CHAINS
Kappa free light chain: 87.4 mg/L — ABNORMAL HIGH (ref 3.3–19.4)
Kappa, lambda light chain ratio: 1.54 (ref 0.26–1.65)
Lambda free light chains: 56.7 mg/L — ABNORMAL HIGH (ref 5.7–26.3)

## 2019-02-21 NOTE — Telephone Encounter (Signed)
Re:  Counts  Reviewed counts with patient.  Counts remain borderline (WBC 3000; ANC 1500). Patient denies any new medications or herbal products.  Feels good. No infections.    Last M-spike 0.  Discuss plan to continue to hold Revlimid.  Check counts in 2-4 weeks.  Patient would like checked at next visit.   Lequita Asal, MD

## 2019-02-21 NOTE — Telephone Encounter (Signed)
-----   Message from Leighton Parody sent at 02/21/2019  3:34 PM EDT ----- Luis [name] called and wanted to see if he could begin taking Revlamid. He said he had spoke to Dr. Mike Gip about it but wasn't given a yes or no answer directly about it.

## 2019-02-21 NOTE — Telephone Encounter (Signed)
Spoke with Luis [name] to inform him to continue to hold the Revlmid until Dr Mike Gip give him the ok to start. The patient was agreeable and understanding not to start Revlimid

## 2019-03-08 ENCOUNTER — Telehealth: Payer: Self-pay | Admitting: Hematology and Oncology

## 2019-03-08 NOTE — Telephone Encounter (Signed)
Re:  Short prednisone taper  Whitney Meeler from podiatry called regarding a 6 day prednisone taper (60 mg to 10 mg) after a fall.  X-rays are negative.     Lequita Asal, MD

## 2019-03-22 ENCOUNTER — Telehealth: Payer: Self-pay

## 2019-03-22 ENCOUNTER — Inpatient Hospital Stay: Payer: Medicare Other | Attending: Hematology and Oncology

## 2019-03-22 ENCOUNTER — Inpatient Hospital Stay: Payer: Medicare Other

## 2019-03-22 ENCOUNTER — Other Ambulatory Visit: Payer: Self-pay

## 2019-03-22 VITALS — Resp 18

## 2019-03-22 DIAGNOSIS — G8929 Other chronic pain: Secondary | ICD-10-CM | POA: Diagnosis not present

## 2019-03-22 DIAGNOSIS — F329 Major depressive disorder, single episode, unspecified: Secondary | ICD-10-CM | POA: Insufficient documentation

## 2019-03-22 DIAGNOSIS — I509 Heart failure, unspecified: Secondary | ICD-10-CM | POA: Diagnosis not present

## 2019-03-22 DIAGNOSIS — E119 Type 2 diabetes mellitus without complications: Secondary | ICD-10-CM | POA: Insufficient documentation

## 2019-03-22 DIAGNOSIS — E785 Hyperlipidemia, unspecified: Secondary | ICD-10-CM | POA: Insufficient documentation

## 2019-03-22 DIAGNOSIS — I10 Essential (primary) hypertension: Secondary | ICD-10-CM | POA: Insufficient documentation

## 2019-03-22 DIAGNOSIS — Z7982 Long term (current) use of aspirin: Secondary | ICD-10-CM | POA: Diagnosis not present

## 2019-03-22 DIAGNOSIS — R0602 Shortness of breath: Secondary | ICD-10-CM | POA: Diagnosis not present

## 2019-03-22 DIAGNOSIS — K219 Gastro-esophageal reflux disease without esophagitis: Secondary | ICD-10-CM | POA: Diagnosis not present

## 2019-03-22 DIAGNOSIS — C9000 Multiple myeloma not having achieved remission: Secondary | ICD-10-CM | POA: Diagnosis present

## 2019-03-22 DIAGNOSIS — D509 Iron deficiency anemia, unspecified: Secondary | ICD-10-CM | POA: Insufficient documentation

## 2019-03-22 DIAGNOSIS — M129 Arthropathy, unspecified: Secondary | ICD-10-CM | POA: Insufficient documentation

## 2019-03-22 DIAGNOSIS — G4730 Sleep apnea, unspecified: Secondary | ICD-10-CM | POA: Insufficient documentation

## 2019-03-22 DIAGNOSIS — I2510 Atherosclerotic heart disease of native coronary artery without angina pectoris: Secondary | ICD-10-CM | POA: Diagnosis not present

## 2019-03-22 DIAGNOSIS — Z87891 Personal history of nicotine dependence: Secondary | ICD-10-CM | POA: Diagnosis not present

## 2019-03-22 DIAGNOSIS — M549 Dorsalgia, unspecified: Secondary | ICD-10-CM | POA: Diagnosis not present

## 2019-03-22 DIAGNOSIS — Z79899 Other long term (current) drug therapy: Secondary | ICD-10-CM | POA: Insufficient documentation

## 2019-03-22 DIAGNOSIS — I252 Old myocardial infarction: Secondary | ICD-10-CM | POA: Insufficient documentation

## 2019-03-22 DIAGNOSIS — E538 Deficiency of other specified B group vitamins: Secondary | ICD-10-CM

## 2019-03-22 DIAGNOSIS — N2889 Other specified disorders of kidney and ureter: Secondary | ICD-10-CM | POA: Diagnosis not present

## 2019-03-22 DIAGNOSIS — Z7984 Long term (current) use of oral hypoglycemic drugs: Secondary | ICD-10-CM | POA: Diagnosis not present

## 2019-03-22 LAB — CBC WITH DIFFERENTIAL/PLATELET
Abs Immature Granulocytes: 0.01 10*3/uL (ref 0.00–0.07)
Basophils Absolute: 0 10*3/uL (ref 0.0–0.1)
Basophils Relative: 1 %
Eosinophils Absolute: 0.3 10*3/uL (ref 0.0–0.5)
Eosinophils Relative: 6 %
HCT: 29.6 % — ABNORMAL LOW (ref 39.0–52.0)
Hemoglobin: 10.1 g/dL — ABNORMAL LOW (ref 13.0–17.0)
Immature Granulocytes: 0 %
Lymphocytes Relative: 23 %
Lymphs Abs: 1 10*3/uL (ref 0.7–4.0)
MCH: 37.7 pg — ABNORMAL HIGH (ref 26.0–34.0)
MCHC: 34.1 g/dL (ref 30.0–36.0)
MCV: 110.4 fL — ABNORMAL HIGH (ref 80.0–100.0)
Monocytes Absolute: 0.6 10*3/uL (ref 0.1–1.0)
Monocytes Relative: 13 %
Neutro Abs: 2.5 10*3/uL (ref 1.7–7.7)
Neutrophils Relative %: 57 %
Platelets: 125 10*3/uL — ABNORMAL LOW (ref 150–400)
RBC: 2.68 MIL/uL — ABNORMAL LOW (ref 4.22–5.81)
RDW: 13 % (ref 11.5–15.5)
WBC: 4.4 10*3/uL (ref 4.0–10.5)
nRBC: 0 % (ref 0.0–0.2)

## 2019-03-22 LAB — COMPREHENSIVE METABOLIC PANEL
ALT: 23 U/L (ref 0–44)
AST: 28 U/L (ref 15–41)
Albumin: 3.1 g/dL — ABNORMAL LOW (ref 3.5–5.0)
Alkaline Phosphatase: 84 U/L (ref 38–126)
Anion gap: 7 (ref 5–15)
BUN: 24 mg/dL — ABNORMAL HIGH (ref 8–23)
CO2: 19 mmol/L — ABNORMAL LOW (ref 22–32)
Calcium: 9.3 mg/dL (ref 8.9–10.3)
Chloride: 110 mmol/L (ref 98–111)
Creatinine, Ser: 1.57 mg/dL — ABNORMAL HIGH (ref 0.61–1.24)
GFR calc Af Amer: 50 mL/min — ABNORMAL LOW (ref >60)
GFR calc non Af Amer: 43 mL/min — ABNORMAL LOW (ref >60)
Glucose, Bld: 227 mg/dL — ABNORMAL HIGH (ref 70–99)
Potassium: 4.3 mmol/L (ref 3.5–5.1)
Sodium: 136 mmol/L (ref 135–145)
Total Bilirubin: 0.6 mg/dL (ref 0.3–1.2)
Total Protein: 7.3 g/dL (ref 6.5–8.1)

## 2019-03-22 MED ORDER — CYANOCOBALAMIN 1000 MCG/ML IJ SOLN
1000.0000 ug | Freq: Once | INTRAMUSCULAR | Status: AC
  Administered 2019-03-22: 1000 ug via INTRAMUSCULAR
  Filled 2019-03-22: qty 1

## 2019-03-22 NOTE — Progress Notes (Signed)
[facility]  [address] [place],  [address] Phone: [phone]  Fax: [phone]   Clinic Day:  03/26/2019  Referring physician: Tracie Harrier, MD  Chief Complaint: Luis [name]. is a 73 y.o. male with multiple myeloma who is seen for a 4 week assessment.   HPI: The patient was last seen in the medical oncology clinic on 02/19/2019. At that time, cold symptoms had resolved.  He continued to note daytime sleepiness.  He was using his CPAP.  He had chronic back pain from spinal stenosis. Revlimid was held at that time.   CBCs followed:  02/20/2019: WBC 3,000 (ANC 1,500), hemoglobin 10.6, hematocrit 30.3, platelets 126,000.  M spike was 0. Kappa light chain 87.4,  lambda light chain 56.7, and ratio 1.54. Iron saturation was 56%.  03/22/2019: WBC 4,400 (ANC 2,500), hemoglobin 10.1, hematocrit 29.6, platelets 125,000.  M spike was 0.  He received B12 monthly injection on 02/20/2019 and 03/22/2019.   He fell on 02/24/2019. He presented with low back and hip pain on 02/27/2019. Plain films of the lumbar spine an bilateral hips were negative.  He was put on a 6-day prednisone taper (30m to 132m.  Labs on 03/22/2019 revealed a creatinine 1.57 (up from 0.98).  He was contacted and discovered that he was taking ibuprofen 800 mg every 4 hours.  During the interim, he reports he is "not too good." He has significant back pain from his recent fall. It occurred when he and his wife were trying to put his CPAP machine on and they both fell. He has not been able to lay flat. He is using a heating pad.  Lidocaine cream has not provided relief. Ibuprofen initially provided relief.  He questioned if he could use Tramadol once daily. He has an appointment to receive a monthly injection in his back tomorrow with Dr. ChSharlet Salina  He has pedal edema. He has been elevating his feet and wearing compression socks at night. He had dark stools last week while taking  ibuprofen, but denies any since.  He reports abdominal pain, sharp on the right side.   He doesn't sleep much at night, but mostly during the day. He attributes this to having less pain in the daytime.   He has been eating more vegetables and fruits and hamburgers. He notes a decreased appetite. He has increased hydration. He is down 4lbs at the clinic today since March, and 10lbs since February.   Past Medical History:  Diagnosis Date   Angina pectoris (H[place]   Anxiety    Arthritis    CHF (congestive heart failure) ([place]   Coronary artery disease    Depression    Diabetes mellitus without complication ([place]   Patient takes Metformin.   Diverticulosis    GERD (gastroesophageal reflux disease)    Hyperlipidemia    Hypertension    Multiple myeloma (HCC[place]   Multiple [place]oma ([place]/19/2016   [facility]   [facility]   Shortness of breath dyspnea    Sleep apnea    No CPAP @ present   Spinal stenosis     Past Surgical History:  Procedure Laterality Date   CARDIAC CATHETERIZATION     CARPAL TUNNEL RELEASE     CATARACT EXTRACTION     COLONOSCOPY WITH PROPOFOL N/A 11/11/2015   Procedure: COLONOSCOPY WITH PROPOFOL;  Surgeon: MaLollie SailsMD;  [facility];  Service: Endoscopy;  Laterality: N/A;  CORONARY ARTERY BYPASS GRAFT     EYE SURGERY Bilateral    Cataract Extraction   INGUINAL HERNIA REPAIR     JOINT REPLACEMENT Right 2008   Right Total Hip Replacement   PILONIDAL CYST EXCISION     ROTATOR CUFF REPAIR     TOTAL HIP ARTHROPLASTY Right    VENTRAL HERNIA REPAIR N/A 08/15/2015   Procedure: VENTRAL HERNIA REPAIR WITH MESH ;  Surgeon: Leonie Green, MD;  Location: [facility] ORS;  Service: General;  Laterality: N/A;    Family History  Problem Relation Age of Onset   Heart disease Father    Stroke Mother    Prostate cancer Maternal Grandfather 62    Social History:  reports that he quit  smoking about 19 years ago. His smoking use included cigarettes. He has a 60.00 pack-year smoking history. He quit smokeless tobacco use about 19 years ago. He reports that he does not drink alcohol or use drugs. He drinks 2 glasses wine/day and 2 Bourbon/day.  He notes that his wife goes off to work.  He states that his wife would like to retire, but that would affect his medication coverage.  He has projects that he likes to do at home.  He does wood working.  He has a class on Wednesdays (wood turning). His 42nd high school graduation anniversary was in 06/2016.  He has a new grand-daughter, [name].  He had his 50th anniversary.  His wife's name is [name].  The patient is alone today with his wife over the [place].  Allergies:  Allergies  Allergen Reactions   Pravachol [Pravastatin]    Pravastatin Sodium Other (See Comments)   Statins Other (See Comments)    Muscle aches   Zocor [Simvastatin] Other (See Comments)    Muscle aches    Current Medications: Current Outpatient Medications  Medication Sig Dispense Refill   aspirin 81 MG tablet Take 81 mg by mouth daily.      Bee Pollen 500 MG CHEW Chew 1 tablet by mouth 2 (two) times daily.      Calcium Carbonate-Vitamin D 600-400 MG-UNIT chew tablet Chew 2 tablets by mouth daily.      Cyanocobalamin (VITAMIN B-12 IJ) Inject 1 Dose as directed every 30 (thirty) days.      ergocalciferol (VITAMIN D2) 50000 UNITS capsule Take 50,000 Units by mouth once a week.     glucose blood (ONE TOUCH ULTRA TEST) test strip USE ONCE DAILY. USE AS INSTRUCTED. DX E11.9     isosorbide mononitrate (IMDUR) 30 MG 24 hr tablet Take 30 mg by mouth daily.     lisinopril (PRINIVIL,ZESTRIL) 20 MG tablet Take 1 tablet by mouth daily.     lovastatin (MEVACOR) 40 MG tablet Take 20 mg by mouth at bedtime.      metFORMIN (GLUCOPHAGE) 500 MG tablet 500 mg 2 (two) times daily with a meal.      omeprazole (PRILOSEC) 20 MG capsule Take 20 mg by mouth daily.      ALPRAZolam (XANAX) 0.25 MG tablet Take 0.25 mg by mouth at bedtime as needed. for sleep  3   amoxicillin (AMOXIL) 500 MG capsule TAKE 4 CAPSULES 1 HOUR PRIOR TO DENTAL APPT.     lenalidomide (REVLIMID) 2.5 MG capsule TAKE 1 CAPSULE BY MOUTH ONCE DAILY FOR 21 DAYS ON AND THEN 7 DAYS OFF (Patient not taking: Reported on 03/26/2019) 21 capsule 0   traMADol (ULTRAM) 50 MG tablet TAKE 1 TABLET BY MOUTH THREE TIMES A DAY  AS NEEDED (Patient not taking: Reported on 03/26/2019) 60 tablet 0   No current facility-administered medications for this visit.     Review of Systems  Constitutional: Positive for weight loss (4lbs). Negative for chills, diaphoresis, fever and malaise/fatigue.       Feels "not too good."  HENT: Negative for congestion, ear discharge, ear pain, nosebleeds, sinus pain and sore throat.   Eyes: Negative for blurred vision, double vision, photophobia and pain.  Respiratory: Positive for shortness of breath (exertional). Negative for cough, hemoptysis and sputum production.        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   Cardiovascular: Positive for leg swelling (bilateral ankles). Negative for palpitations, orthopnea and PND.  Gastrointestinal: Positive for abdominal pain and melena (resolved). Negative for blood in stool, constipation, diarrhea, nausea and vomiting.       Decreased appetite.  Genitourinary: Positive for flank pain. Negative for dysuria, frequency, hematuria and urgency.  Musculoskeletal: Positive for back pain (spinal setnosis, recent fall) and falls (mid-April). Negative for joint pain, myalgias and neck pain.  Skin: Negative for itching and rash.  Neurological: Positive for weakness (difficulty standing). Negative for dizziness, tremors, speech change, focal weakness and headaches.       Requires cane for ambulation.  Endo/Heme/Allergies: Does not bruise/bleed easily.       Diabetes.  Psychiatric/Behavioral: Negative for depression and memory loss. The  patient has insomnia (daytime sleepiness). The patient is not nervous/anxious.   All other systems reviewed and are negative.  Performance status (ECOG): 2-3  Physical Exam  Constitutional: He is oriented to person, place, and time. He appears well-developed and well-nourished. No distress.  Elderly gentleman sitting comfortably in the exam room in no acute distress.   HENT:  Head: Normocephalic and atraumatic.  Nose: Mucosal edema and rhinorrhea present.  Mouth/Throat: Oropharynx is clear and moist. No oropharyngeal exudate.  Short white hair. Male pattern baldness.  Wearing a mask.  Eyes: Pupils are equal, round, and reactive to light. Conjunctivae and EOM are normal. No scleral icterus.  Neck: Neck supple. No JVD present.  Cardiovascular: Normal rate, regular rhythm and normal heart sounds.  No murmur heard. Pulmonary/Chest: Breath sounds normal. No respiratory distress. He has no wheezes. He has no rales.  Abdominal: Soft. Bowel sounds are normal. He exhibits no distension and no mass. There is no abdominal tenderness. There is no rebound and no guarding.  Musculoskeletal:        General: Tenderness (back) and edema (bilateral ankles) present.     Comments: Uses a cane to ambulate.  Lymphadenopathy:    He has no cervical adenopathy.    He has no axillary adenopathy.       Right: No supraclavicular adenopathy present.       Left: No supraclavicular adenopathy present.  Neurological: He is alert and oriented to person, place, and time.  Skin: Skin is warm and dry. No rash noted. He is not diaphoretic. No erythema. No pallor.  Psychiatric: He has a normal mood and affect. His behavior is normal. Judgment and thought content normal.  Nursing note and vitals reviewed.   Appointment on 03/26/2019  Component Date Value Ref Range Status   Sodium 03/26/2019 133* 135 - 145 mmol/L Final   Potassium 03/26/2019 4.6  3.5 - 5.1 mmol/L Final   Chloride 03/26/2019 105  98 - 111 mmol/L  Final   CO2 03/26/2019 22  22 - 32 mmol/L Final   Glucose, Bld 03/26/2019 229* 70 - 99 mg/dL  Final   BUN 03/26/2019 21  8 - 23 mg/dL Final   Creatinine, Ser 03/26/2019 1.06  0.61 - 1.24 mg/dL Final   Calcium 03/26/2019 9.3  8.9 - 10.3 mg/dL Final   Total Protein 03/26/2019 7.1  6.5 - 8.1 g/dL Final   Albumin 03/26/2019 3.0* 3.5 - 5.0 g/dL Final   AST 03/26/2019 35  15 - 41 U/L Final   ALT 03/26/2019 26  0 - 44 U/L Final   Alkaline Phosphatase 03/26/2019 74  38 - 126 U/L Final   Total Bilirubin 03/26/2019 0.8  0.3 - 1.2 mg/dL Final   GFR calc non Af Amer 03/26/2019 >60  >60 mL/min Final   GFR calc Af Amer 03/26/2019 >60  >60 mL/min Final   Anion gap 03/26/2019 6  5 - 15 Final   Performed at [facility], [address] [place], [place] [address]   WBC 03/26/2019 3.3* 4.0 - 10.5 K/uL Final   RBC 03/26/2019 2.50* 4.22 - 5.81 MIL/uL Final   Hemoglobin 03/26/2019 9.5* 13.0 - 17.0 g/dL Final   HCT 03/26/2019 27.6* 39.0 - 52.0 % Final   MCV 03/26/2019 110.4* 80.0 - 100.0 fL Final   MCH 03/26/2019 38.0* 26.0 - 34.0 pg Final   MCHC 03/26/2019 34.4  30.0 - 36.0 g/dL Final   RDW 03/26/2019 12.8  11.5 - 15.5 % Final   Platelets 03/26/2019 121* 150 - 400 K/uL Final   nRBC 03/26/2019 0.0  0.0 - 0.2 % Final   Neutrophils Relative % 03/26/2019 48  % Final   Neutro Abs 03/26/2019 1.6* 1.7 - 7.7 K/uL Final   Lymphocytes Relative 03/26/2019 29  % Final   Lymphs Abs 03/26/2019 1.0  0.7 - 4.0 K/uL Final   Monocytes Relative 03/26/2019 14  % Final   Monocytes Absolute 03/26/2019 0.5  0.1 - 1.0 K/uL Final   Eosinophils Relative 03/26/2019 7  % Final   Eosinophils Absolute 03/26/2019 0.2  0.0 - 0.5 K/uL Final   Basophils Relative 03/26/2019 1  % Final   Basophils Absolute 03/26/2019 0.0  0.0 - 0.1 K/uL Final   Immature Granulocytes 03/26/2019 1  % Final   Abs Immature Granulocytes 03/26/2019 0.02  0.00 - 0.07 K/uL Final   Performed at [facility], [address], [place], [place] [address]    Assessment:  Doron Shake. is a 73 y.o. male with stage III multiple myeloma. He presented in 02/2013 with left-sided sharp pain. Evaluation revealed wide-spread lytic lesions including fracture of the left 5th rib laterally. CT scans on 02/22/2013 showed innumerable lytic lesions in thoracic spine, sternum, clavicle, scapula, and ribs.   Bone marrow biopsy on 03/13/2013 revealed 15 to 20% plasma cells. Iron stores were absent. Renal function was normal. SPEP on 03/01/2013 revealed a 1.4 gm/dL IgG monoclonal lambda. IgG was 1987.   He began induction with Revlimid 25 mg a day (3 weeks on and 1 week off) and Decadron (40 mg once a week) on 04/09/2013. SPEP has revealed no monoclonal protein since 07/26/2014 (last check 03/22/2019). IgG was 1362 on 07/26/2014 and 1097 on 12/19/2014.   Lambda free light chains have been monitored: 33.20 (ratio of 1.56) on 09/20/2014, 31.51 (ratio of 1.43) on 12/19/2014, 30.25 (ratio of 1.56) on 05/01/2015, 31.95 (ratio 1.49) on 01/01/2016, 28.02 (ratio 1.29) on 03/25/2016, 31.5 (ratio 1.26) on 05/28/2016, 21.8 (ratio 1.35) on 08/20/2016, 37.3 (ratio 1.09) on 10/22/2016, 46.3 (ratio 1.49) on 10/21/2017, 54 (ratio 1.84) on 04/14/2018, 66.9 (  ratio 1.64) on 11/13/2018, and 87.4 (ratio 1.54) on 02/20/2019.  24 hour UPEP on 12/02/2015 revealed no monoclonal protein.  With remission, Revlimid was decreased to 10 mg a day then to 10 mg every other day secondary to issues with renal function and diarrhea.  Current Revlimid is 2.5 mg a day (3 weeks on/1 week off).  Last cycle of Revlimid started on 10/27/2018.  Bone survey on 05/30/2015 revealed the majority of small lytic lesions were stable.  There was possibly new lesion in the distal left clavicle and right mid femur (small) and a possible developing lucency in the proximal left femoral shaft.  The calvarial lesion noted on the prior study was not well  seen (? positional).  Bone survey on 11/27/2015 revealed widespread bony lytic lesions consistent with multiple myeloma.  The vast majority of lesions were stable.  A few small lesions were not previously seen.  One lesion was previously obscured by bowel contents.  Two small lesions in the right distal femoral diaphysis appeared new.  Bone survey on 05/25/2016 revealed no evidence of progressive myelomatous involvement of the skeleton.  Bone survey on 05/16/2017 revealed multiple lucent lesions as previously seen. There was no convincing evidence of progression or superimposed abnormality since prior study.  Bone survey on 06/01/2018 revealed scattered lucent lesions, with no evidence for progression.   He received Zometa every 3 months (last 02/28/2017). He takes calcium 4 pills/day. He receives B12 every 6 weeks (last 02/20/2019). B12 was 370 on 01/17/2015 and 493 on 10/13/2018.  Folate was 17.5 on 09/17/2016 and 35 on 10/13/2018.  TSH was normal on 07/07/2018.  Abdominal and pelvic CT scan on 07/15/2015 revealed  extensive sigmoid diverticulosis. There was questionable wall thickening of the ascending colon versus artifact from incomplete distention.  Colonoscopy on 11/11/2015 revealed diverticulosis in the sigmoid colon and distal descending colon and ascending colon.  There was a 2 mm polyp in the mid sigmoid colon.  Pathology revealed a hyperplastic polyp, negative for dysplasia or malignancy.  Symptomatically, he is fatigued.  He notes back pain since a fall.  He noted transient black stools.  He was taking ibuprofen 800 mg every 4 hours. Exam reveals general fatigue.  Plan: 1. Review labs from 03/22/2019. 2. Labs today:  CBC with diff, CMP. 3. Multiple myeloma Clinically he is doing well. SPEP remains negative. Revlimid currently on hold secondary other issues. Bone survey scheduled on 06/02/2019. Anticipate restarting Revlimid next month. 3. Macrocytic anemia Etiology unclear. B12,  folate, and TSH were normal on 10/13/2018. He may have an underlying myelodysplastic syndrome. If counts decline, consider repeat bone marrow after COVID-19. 4. B12 deficiency Patient on monthly B12 (last 02/20/2019). B12 today. 5.   Melanotic stools  Patient notes black stools while taking ibuprofen every 4 hours.  Etiology may be secondary to gastritis.   Discuss avoiding ibuprofen for now.     Discuss taking ibuprofen at most every 8 hours in the future.  Guaiac cards x 3. 6.   Renal insufficiency  Baseline creatinine 0.98 - 1.09.  Creatinine was 1.57 when discovered he was taking ibuprofen 800 mg every 4 hours.  Creatinine 1.06 today.  Continue good hydration and avoidance of ibuprofen for now. 7.   RN to call patient on 05/19 or 03/28/2019 after appt with Dr. Sharlet Salina. 8.   RTC in 3 weeks for MD assessment, labs (CBC with dif, CMP, SPEP), and reinitiation of Revlimid.  I discussed the assessment and treatment plan with the patient.  The patient was provided an opportunity to ask questions and all were answered.  The patient agreed with the plan and demonstrated an understanding of the instructions.  The patient was advised to call back if the symptoms worsen or if the condition fails to improve as anticipated.  I provided 30 minutes of face-to-face time during this this encounter and > 50% was spent counseling as documented under my assessment and plan.    Lequita Asal, MD, PhD    03/26/2019, 1:36 PM  I, Molly Dorshimer, am acting as Education administrator for [employer]. Mike Gip, MD, PhD.  I, Marquesha Robideau C. Mike Gip, MD, have reviewed the above documentation for accuracy and completeness, and I agree with the above.

## 2019-03-22 NOTE — Telephone Encounter (Signed)
-----   Message from Lequita Asal, MD sent at 03/22/2019  3:41 PM EDT ----- Regarding: Please call patient and contact PCP (important)  Creatinine has increased by 50%.  Renal function is down.  Anything different?  New medications?  Dehydration?  Diarrhea?  Make sure he is not taking Revlimid (cleared by the kidney).  He will need to be seen for repeat labs and evaluation if not returning to baseline.  M ----- Message ----- From: Buel Ream, Lab In [place] Sent: 03/22/2019   1:31 PM EDT To: Lequita Asal, MD

## 2019-03-22 NOTE — Telephone Encounter (Signed)
Spoke with Mr Stolp to inform him of his lab results. His Crea has increased by 50%. (0.98 to 1.57) The patient do states he is having little dark stools with diarrhea and 1-2 times a day nose bleeds. The patient also states he have been taking Ibuprofen 800 mg every 4 hours. The patient refuse taking Revlimid at this time. I have instructed the patient to drink plenty of fluids to keep his kidneys flushed. I have sent over labs for the patient PCP to review. I have also contacted the PCP office by phone to give them report on this patient and asked if they can see him as early as tomorrow. I have called back to the patient to inform him to stop taking the Ibuprofen as of now. Please don't take anymore. He do report having a fall about 1 month ago and states hurting his lower back. He says this is the reason he is taking the Ibuprofen. His wife states she will reach out to the MD office tomorrow. Dr Mike Gip has been notified and has assist me to call his PCP.  The patient wife is understanding and agreeable.

## 2019-03-22 NOTE — Patient Instructions (Signed)
Cyanocobalamin, Vitamin B12 injection What is this medicine? CYANOCOBALAMIN (sye an oh koe BAL a min) is a man made form of vitamin B12. Vitamin B12 is used in the growth of healthy blood cells, nerve cells, and proteins in the body. It also helps with the metabolism of fats and carbohydrates. This medicine is used to treat people who can not absorb vitamin B12. This medicine may be used for other purposes; ask your health care provider or pharmacist if you have questions. COMMON BRAND NAME(S): B-12 Compliance Kit, B-12 Injection Kit, Cyomin, LA-12, Nutri-Twelve, Physicians EZ Use B-12, Primabalt What should I tell my health care provider before I take this medicine? They need to know if you have any of these conditions: -kidney disease -Leber's disease -megaloblastic anemia -an unusual or allergic reaction to cyanocobalamin, cobalt, other medicines, foods, dyes, or preservatives -pregnant or trying to get pregnant -breast-feeding How should I use this medicine? This medicine is injected into a muscle or deeply under the skin. It is usually given by a health care professional in a clinic or doctor's office. However, your doctor may teach you how to inject yourself. Follow all instructions. Talk to your pediatrician regarding the use of this medicine in children. Special care may be needed. Overdosage: If you think you have taken too much of this medicine contact a poison control center or emergency room at once. NOTE: This medicine is only for you. Do not share this medicine with others. What if I miss a dose? If you are given your dose at a clinic or doctor's office, call to reschedule your appointment. If you give your own injections and you miss a dose, take it as soon as you can. If it is almost time for your next dose, take only that dose. Do not take double or extra doses. What may interact with this medicine? -colchicine -heavy alcohol intake This list may not describe all possible  interactions. Give your health care provider a list of all the medicines, herbs, non-prescription drugs, or dietary supplements you use. Also tell them if you smoke, drink alcohol, or use illegal drugs. Some items may interact with your medicine. What should I watch for while using this medicine? Visit your doctor or health care professional regularly. You may need blood work done while you are taking this medicine. You may need to follow a special diet. Talk to your doctor. Limit your alcohol intake and avoid smoking to get the best benefit. What side effects may I notice from receiving this medicine? Side effects that you should report to your doctor or health care professional as soon as possible: -allergic reactions like skin rash, itching or hives, swelling of the face, lips, or tongue -blue tint to skin -chest tightness, pain -difficulty breathing, wheezing -dizziness -red, swollen painful area on the leg Side effects that usually do not require medical attention (report to your doctor or health care professional if they continue or are bothersome): -diarrhea -headache This list may not describe all possible side effects. Call your doctor for medical advice about side effects. You may report side effects to FDA at 1-800-FDA-1088. Where should I keep my medicine? Keep out of the reach of children. Store at room temperature between 15 and 30 degrees C (59 and 85 degrees F). Protect from light. Throw away any unused medicine after the expiration date. NOTE: This sheet is a summary. It may not cover all possible information. If you have questions about this medicine, talk to your doctor, pharmacist, or   health care provider.  2019 Elsevier/Gold Standard (2008-02-05 22:10:20)  

## 2019-03-23 LAB — PROTEIN ELECTROPHORESIS, SERUM
A/G Ratio: 0.8 (ref 0.7–1.7)
Albumin ELP: 2.9 g/dL (ref 2.9–4.4)
Alpha-1-Globulin: 0.3 g/dL (ref 0.0–0.4)
Alpha-2-Globulin: 0.7 g/dL (ref 0.4–1.0)
Beta Globulin: 1.4 g/dL — ABNORMAL HIGH (ref 0.7–1.3)
Gamma Globulin: 1.3 g/dL (ref 0.4–1.8)
Globulin, Total: 3.8 g/dL (ref 2.2–3.9)
Total Protein ELP: 6.7 g/dL (ref 6.0–8.5)

## 2019-03-25 DIAGNOSIS — D539 Nutritional anemia, unspecified: Secondary | ICD-10-CM | POA: Insufficient documentation

## 2019-03-25 DIAGNOSIS — N289 Disorder of kidney and ureter, unspecified: Secondary | ICD-10-CM | POA: Insufficient documentation

## 2019-03-26 ENCOUNTER — Inpatient Hospital Stay: Payer: Medicare Other

## 2019-03-26 ENCOUNTER — Encounter: Payer: Self-pay | Admitting: Hematology and Oncology

## 2019-03-26 ENCOUNTER — Other Ambulatory Visit: Payer: Self-pay

## 2019-03-26 ENCOUNTER — Other Ambulatory Visit: Payer: PRIVATE HEALTH INSURANCE

## 2019-03-26 ENCOUNTER — Inpatient Hospital Stay (HOSPITAL_BASED_OUTPATIENT_CLINIC_OR_DEPARTMENT_OTHER): Payer: Medicare Other | Admitting: Hematology and Oncology

## 2019-03-26 VITALS — BP 122/75 | HR 71 | Temp 97.7°F | Resp 18 | Ht 67.0 in | Wt 211.9 lb

## 2019-03-26 DIAGNOSIS — Z79899 Other long term (current) drug therapy: Secondary | ICD-10-CM

## 2019-03-26 DIAGNOSIS — E119 Type 2 diabetes mellitus without complications: Secondary | ICD-10-CM

## 2019-03-26 DIAGNOSIS — E538 Deficiency of other specified B group vitamins: Secondary | ICD-10-CM

## 2019-03-26 DIAGNOSIS — N2889 Other specified disorders of kidney and ureter: Secondary | ICD-10-CM | POA: Diagnosis not present

## 2019-03-26 DIAGNOSIS — Z7189 Other specified counseling: Secondary | ICD-10-CM

## 2019-03-26 DIAGNOSIS — N289 Disorder of kidney and ureter, unspecified: Secondary | ICD-10-CM

## 2019-03-26 DIAGNOSIS — C9000 Multiple myeloma not having achieved remission: Secondary | ICD-10-CM

## 2019-03-26 DIAGNOSIS — D539 Nutritional anemia, unspecified: Secondary | ICD-10-CM

## 2019-03-26 DIAGNOSIS — Z87891 Personal history of nicotine dependence: Secondary | ICD-10-CM

## 2019-03-26 DIAGNOSIS — F329 Major depressive disorder, single episode, unspecified: Secondary | ICD-10-CM

## 2019-03-26 DIAGNOSIS — R0602 Shortness of breath: Secondary | ICD-10-CM

## 2019-03-26 DIAGNOSIS — E785 Hyperlipidemia, unspecified: Secondary | ICD-10-CM

## 2019-03-26 DIAGNOSIS — Z7984 Long term (current) use of oral hypoglycemic drugs: Secondary | ICD-10-CM

## 2019-03-26 DIAGNOSIS — G4730 Sleep apnea, unspecified: Secondary | ICD-10-CM

## 2019-03-26 DIAGNOSIS — G8929 Other chronic pain: Secondary | ICD-10-CM

## 2019-03-26 DIAGNOSIS — I252 Old myocardial infarction: Secondary | ICD-10-CM

## 2019-03-26 DIAGNOSIS — I509 Heart failure, unspecified: Secondary | ICD-10-CM

## 2019-03-26 DIAGNOSIS — M129 Arthropathy, unspecified: Secondary | ICD-10-CM

## 2019-03-26 DIAGNOSIS — D509 Iron deficiency anemia, unspecified: Secondary | ICD-10-CM

## 2019-03-26 DIAGNOSIS — Z7982 Long term (current) use of aspirin: Secondary | ICD-10-CM

## 2019-03-26 DIAGNOSIS — K219 Gastro-esophageal reflux disease without esophagitis: Secondary | ICD-10-CM

## 2019-03-26 DIAGNOSIS — I10 Essential (primary) hypertension: Secondary | ICD-10-CM

## 2019-03-26 DIAGNOSIS — I2510 Atherosclerotic heart disease of native coronary artery without angina pectoris: Secondary | ICD-10-CM

## 2019-03-26 DIAGNOSIS — M549 Dorsalgia, unspecified: Secondary | ICD-10-CM

## 2019-03-26 LAB — CBC WITH DIFFERENTIAL/PLATELET
Abs Immature Granulocytes: 0.02 10*3/uL (ref 0.00–0.07)
Basophils Absolute: 0 10*3/uL (ref 0.0–0.1)
Basophils Relative: 1 %
Eosinophils Absolute: 0.2 10*3/uL (ref 0.0–0.5)
Eosinophils Relative: 7 %
HCT: 27.6 % — ABNORMAL LOW (ref 39.0–52.0)
Hemoglobin: 9.5 g/dL — ABNORMAL LOW (ref 13.0–17.0)
Immature Granulocytes: 1 %
Lymphocytes Relative: 29 %
Lymphs Abs: 1 10*3/uL (ref 0.7–4.0)
MCH: 38 pg — ABNORMAL HIGH (ref 26.0–34.0)
MCHC: 34.4 g/dL (ref 30.0–36.0)
MCV: 110.4 fL — ABNORMAL HIGH (ref 80.0–100.0)
Monocytes Absolute: 0.5 10*3/uL (ref 0.1–1.0)
Monocytes Relative: 14 %
Neutro Abs: 1.6 10*3/uL — ABNORMAL LOW (ref 1.7–7.7)
Neutrophils Relative %: 48 %
Platelets: 121 10*3/uL — ABNORMAL LOW (ref 150–400)
RBC: 2.5 MIL/uL — ABNORMAL LOW (ref 4.22–5.81)
RDW: 12.8 % (ref 11.5–15.5)
WBC: 3.3 10*3/uL — ABNORMAL LOW (ref 4.0–10.5)
nRBC: 0 % (ref 0.0–0.2)

## 2019-03-26 LAB — IRON AND TIBC
Iron: 100 ug/dL (ref 45–182)
Saturation Ratios: 36 % (ref 17.9–39.5)
TIBC: 281 ug/dL (ref 250–450)
UIBC: 181 ug/dL

## 2019-03-26 LAB — COMPREHENSIVE METABOLIC PANEL
ALT: 26 U/L (ref 0–44)
AST: 35 U/L (ref 15–41)
Albumin: 3 g/dL — ABNORMAL LOW (ref 3.5–5.0)
Alkaline Phosphatase: 74 U/L (ref 38–126)
Anion gap: 6 (ref 5–15)
BUN: 21 mg/dL (ref 8–23)
CO2: 22 mmol/L (ref 22–32)
Calcium: 9.3 mg/dL (ref 8.9–10.3)
Chloride: 105 mmol/L (ref 98–111)
Creatinine, Ser: 1.06 mg/dL (ref 0.61–1.24)
GFR calc Af Amer: >60 mL/min (ref >60)
GFR calc non Af Amer: >60 mL/min (ref >60)
Glucose, Bld: 229 mg/dL — ABNORMAL HIGH (ref 70–99)
Potassium: 4.6 mmol/L (ref 3.5–5.1)
Sodium: 133 mmol/L — ABNORMAL LOW (ref 135–145)
Total Bilirubin: 0.8 mg/dL (ref 0.3–1.2)
Total Protein: 7.1 g/dL (ref 6.5–8.1)

## 2019-03-26 LAB — RETICULOCYTES
Immature Retic Fract: 12.4 % (ref 2.3–15.9)
RBC.: 2.53 MIL/uL — ABNORMAL LOW (ref 4.22–5.81)
Retic Count, Absolute: 29.3 10*3/uL (ref 19.0–186.0)
Retic Ct Pct: 1.2 % (ref 0.4–3.1)

## 2019-03-26 LAB — FERRITIN: Ferritin: 312 ng/mL (ref 24–336)

## 2019-03-26 NOTE — Progress Notes (Signed)
Patient does c/o bilateral feet swollen x 1 months  But not tender to touch. The patient was given Lidocaine cream for his back. Patient states it has not help that well.

## 2019-03-27 ENCOUNTER — Other Ambulatory Visit: Payer: Self-pay | Admitting: Physical Medicine and Rehabilitation

## 2019-03-27 DIAGNOSIS — M5416 Radiculopathy, lumbar region: Secondary | ICD-10-CM

## 2019-04-03 ENCOUNTER — Other Ambulatory Visit: Payer: Self-pay

## 2019-04-03 ENCOUNTER — Other Ambulatory Visit
Admission: RE | Admit: 2019-04-03 | Discharge: 2019-04-03 | Disposition: A | Discharge status: Home / Self Care | Payer: Medicare Other | Source: Ambulatory Visit | Attending: Hematology and Oncology | Admitting: Hematology and Oncology

## 2019-04-03 DIAGNOSIS — D539 Nutritional anemia, unspecified: Secondary | ICD-10-CM

## 2019-04-03 LAB — OCCULT BLOOD X 1 CARD TO LAB, STOOL
Fecal Occult Bld: NEGATIVE
Fecal Occult Bld: NEGATIVE
Fecal Occult Bld: NEGATIVE

## 2019-04-06 ENCOUNTER — Other Ambulatory Visit: Payer: Self-pay

## 2019-04-06 ENCOUNTER — Ambulatory Visit
Admission: RE | Admit: 2019-04-06 | Discharge: 2019-04-06 | Disposition: A | Discharge status: Home / Self Care | Payer: Medicare Other | Source: Ambulatory Visit | Attending: Physical Medicine and Rehabilitation | Admitting: Physical Medicine and Rehabilitation

## 2019-04-06 DIAGNOSIS — M5416 Radiculopathy, lumbar region: Secondary | ICD-10-CM | POA: Diagnosis not present

## 2019-04-11 ENCOUNTER — Other Ambulatory Visit: Payer: Self-pay

## 2019-04-12 ENCOUNTER — Inpatient Hospital Stay: Payer: Medicare Other | Attending: Hematology and Oncology

## 2019-04-12 DIAGNOSIS — D539 Nutritional anemia, unspecified: Secondary | ICD-10-CM

## 2019-04-12 DIAGNOSIS — C9000 Multiple myeloma not having achieved remission: Secondary | ICD-10-CM | POA: Diagnosis not present

## 2019-04-12 LAB — COMPREHENSIVE METABOLIC PANEL
ALT: 13 U/L (ref 0–44)
AST: 22 U/L (ref 15–41)
Albumin: 3.2 g/dL — ABNORMAL LOW (ref 3.5–5.0)
Alkaline Phosphatase: 59 U/L (ref 38–126)
Anion gap: 9 (ref 5–15)
BUN: 24 mg/dL — ABNORMAL HIGH (ref 8–23)
CO2: 21 mmol/L — ABNORMAL LOW (ref 22–32)
Calcium: 9.8 mg/dL (ref 8.9–10.3)
Chloride: 103 mmol/L (ref 98–111)
Creatinine, Ser: 1.08 mg/dL (ref 0.61–1.24)
GFR calc Af Amer: >60 mL/min (ref >60)
GFR calc non Af Amer: >60 mL/min (ref >60)
Glucose, Bld: 120 mg/dL — ABNORMAL HIGH (ref 70–99)
Potassium: 4.9 mmol/L (ref 3.5–5.1)
Sodium: 133 mmol/L — ABNORMAL LOW (ref 135–145)
Total Bilirubin: 1 mg/dL (ref 0.3–1.2)
Total Protein: 7.2 g/dL (ref 6.5–8.1)

## 2019-04-12 LAB — RETICULOCYTES
Immature Retic Fract: 16 % — ABNORMAL HIGH (ref 2.3–15.9)
RBC.: 2.57 MIL/uL — ABNORMAL LOW (ref 4.22–5.81)
Retic Count, Absolute: 38.6 10*3/uL (ref 19.0–186.0)
Retic Ct Pct: 1.5 % (ref 0.4–3.1)

## 2019-04-12 LAB — CBC WITH DIFFERENTIAL/PLATELET
Abs Immature Granulocytes: 0.01 10*3/uL (ref 0.00–0.07)
Basophils Absolute: 0 10*3/uL (ref 0.0–0.1)
Basophils Relative: 1 %
Eosinophils Absolute: 0.1 10*3/uL (ref 0.0–0.5)
Eosinophils Relative: 4 %
HCT: 27.9 % — ABNORMAL LOW (ref 39.0–52.0)
Hemoglobin: 9.6 g/dL — ABNORMAL LOW (ref 13.0–17.0)
Immature Granulocytes: 0 %
Lymphocytes Relative: 32 %
Lymphs Abs: 1.1 10*3/uL (ref 0.7–4.0)
MCH: 37.6 pg — ABNORMAL HIGH (ref 26.0–34.0)
MCHC: 34.4 g/dL (ref 30.0–36.0)
MCV: 109.4 fL — ABNORMAL HIGH (ref 80.0–100.0)
Monocytes Absolute: 0.4 10*3/uL (ref 0.1–1.0)
Monocytes Relative: 13 %
Neutro Abs: 1.6 10*3/uL — ABNORMAL LOW (ref 1.7–7.7)
Neutrophils Relative %: 50 %
Platelets: 145 10*3/uL — ABNORMAL LOW (ref 150–400)
RBC: 2.55 MIL/uL — ABNORMAL LOW (ref 4.22–5.81)
RDW: 13.2 % (ref 11.5–15.5)
WBC: 3.3 10*3/uL — ABNORMAL LOW (ref 4.0–10.5)
nRBC: 0 % (ref 0.0–0.2)

## 2019-04-13 ENCOUNTER — Other Ambulatory Visit: Payer: Self-pay

## 2019-04-13 ENCOUNTER — Encounter: Payer: Self-pay | Admitting: Physical Medicine and Rehabilitation

## 2019-04-13 LAB — PROTEIN ELECTROPHORESIS, SERUM
A/G Ratio: 0.8 (ref 0.7–1.7)
Albumin ELP: 3.1 g/dL (ref 2.9–4.4)
Alpha-1-Globulin: 0.3 g/dL (ref 0.0–0.4)
Alpha-2-Globulin: 0.8 g/dL (ref 0.4–1.0)
Beta Globulin: 1.3 g/dL (ref 0.7–1.3)
Gamma Globulin: 1.3 g/dL (ref 0.4–1.8)
Globulin, Total: 3.7 g/dL (ref 2.2–3.9)
M-Spike, %: 0.3 g/dL — ABNORMAL HIGH
Total Protein ELP: 6.8 g/dL (ref 6.0–8.5)

## 2019-04-15 NOTE — Progress Notes (Signed)
[facility]  [address] [place], [place] [address] Phone: [phone]  Fax: [phone]   Telemedicine Office Visit:  04/16/2019  Referring physician: Tracie Harrier, MD  I connected with [name]. on 04/16/2019 at 12:06 PM EDT by videoconferencing and verified that I was speaking with the correct person using 2 identifiers.  The patient was at home.  I discussed the limitations, risk, security and privacy concerns of performing an evaluation and management service by videoconferencing and  the availability of in person appointments.  I also discussed with the patient that there may be a patient responsible charge related to this service.  The patient expressed understanding and agreed to proceed.   Chief Complaint: Luis [name]. is a 73 y.o. male with multiple myeloma who is seen for a 3 week assessment prior to initiation of Revlimid.   HPI: The patient was last seen in the medical oncology and hematology clinic on 03/26/2019. At that time, he was fatigued.  He noted back pain since a fall. He noted transient black stools.  He was taking ibuprofen 800 mg every 4 hours. Exam revealed general fatigue.  MRI of the lumbar spine without contrast on 04/06/2019 revealed acute to subacute compression fracture at T11 affecting the superior endplate region with a fluid-filled cavity. There was considerable bone marrow edema. This was favored to represent a benign fracture. However, in this clinical setting, that cannot be stated with absolute certainty. No evidence of other myeloma foci in the lower thoracic or lumbar region. There is a 3 cm cystic focus within the left iliac bone most consistent with a previous treated myeloma focus. There was multifactorial spinal stenosis at L4-5 with potential for neural compression on either or both sides, somewhat worse on the right.  Stool tests were negative for blood on 03/28/2019, 03/29/2019, and 03/30/2019.    Labs on 04/12/2019 reviewed: WBC 3,300, hemoglobin 9.6, hematocrit 27.9, MCV 109.4, platelets 145,000. BUN 24, creatinine 1.08, Albumin 3.2.  Retic was 1.5%. M-spike was 0.3 gm/dL.   During the interim, he has been doing okay. He reports pain in his back due to a compression fracture from a recent fall. Pain ranges from 4-10 out of 10 depending on how he moves.  He takes tramadol for pain. He has not been able to sleep in a bed, and has been sleeping in a recliner. His mobility has mildly improved, and he can get up from a chair without pain.   He is sleeping during the day and is awake at night. He reports his appetite is terrible and he has lost about 10lbs. He is trying to eat iron rich foods. He drinks about 2 glasses of wine at night. His bowels have become more regular, and he has constipation only occasionally.    Past Medical History:  Diagnosis Date   Angina pectoris ([place])    Anxiety    Arthritis    CHF (congestive heart failure) ([place])    Coronary artery disease    Depression    Diabetes mellitus without complication ([place])    Patient takes Metformin.   Diverticulosis    GERD (gastroesophageal reflux disease)    Hyperlipidemia    Hypertension    Multiple myeloma ([place])    Multiple myeloma ([place]) 03/27/2015   Myocardial infarction [facility]) April 2001   widowmaker   Shortness of breath dyspnea    Sleep apnea    No CPAP @ present   Spinal stenosis  Past Surgical History:  Procedure Laterality Date   CARDIAC CATHETERIZATION     CARPAL TUNNEL RELEASE     CATARACT EXTRACTION     COLONOSCOPY WITH PROPOFOL N/A 11/11/2015   Procedure: COLONOSCOPY WITH PROPOFOL;  Surgeon: Lollie Sails, MD;  Location: [facility] ENDOSCOPY;  Service: Endoscopy;  Laterality: N/A;   CORONARY ARTERY BYPASS GRAFT     EYE SURGERY Bilateral    Cataract Extraction   INGUINAL HERNIA REPAIR     JOINT REPLACEMENT Right 2008   Right Total Hip Replacement   PILONIDAL CYST EXCISION      ROTATOR CUFF REPAIR     TOTAL HIP ARTHROPLASTY Right    VENTRAL HERNIA REPAIR N/A 08/15/2015   Procedure: VENTRAL HERNIA REPAIR WITH MESH ;  Surgeon: Leonie Green, MD;  Location: [facility] ORS;  Service: General;  Laterality: N/A;    Family History  Problem Relation Age of Onset   Heart disease Father    Stroke Mother    Prostate cancer Maternal Grandfather 63    Social History:  reports that he quit smoking about 19 years ago. His smoking use included cigarettes. He has a 60.00 pack-year smoking history. He quit smokeless tobacco use about 19 years ago. He reports that he does not drink alcohol or use drugs.He drinks 2 glasses wine/day. He notes that his wife goes off to work. He states that his wife would like to retire, but that would affect his medication coverage. He has projects that he likes to do at home. He does wood working. He has a class on Wednesdays (wood turning). His 42nd high school graduation anniversary was in 06/2016. He has a new grand-daughter, [name]. He had his 50th anniversary. His wife's name is [name]. The patient is accompanied by his wife today.   Participants in the patient's visit and their role in the encounter included the patient, his wife, and Vito Berger, CMA, today.  The intake visit was provided by Vito Berger, CMA.   Allergies:  Allergies  Allergen Reactions   Pravachol [Pravastatin]    Pravastatin Sodium Other (See Comments)   Statins Other (See Comments)    Muscle aches   Zocor [Simvastatin] Other (See Comments)    Muscle aches    Current Medications: Current Outpatient Medications  Medication Sig Dispense Refill   ALPRAZolam (XANAX) 0.25 MG tablet Take 0.25 mg by mouth at bedtime as needed. for sleep  3   aspirin 81 MG tablet Take 81 mg by mouth daily.      Bee Pollen 500 MG CHEW Chew 1 tablet by mouth 2 (two) times daily.      Calcium Carbonate-Vitamin D 600-400 MG-UNIT chew tablet Chew 2 tablets by  mouth daily.      Cyanocobalamin (VITAMIN B-12 IJ) Inject 1 Dose as directed every 30 (thirty) days.      ergocalciferol (VITAMIN D2) 50000 UNITS capsule Take 50,000 Units by mouth once a week.     glucose blood (ONE TOUCH ULTRA TEST) test strip USE ONCE DAILY. USE AS INSTRUCTED. DX E11.9     isosorbide mononitrate (IMDUR) 30 MG 24 hr tablet Take 30 mg by mouth daily.     lisinopril (PRINIVIL,ZESTRIL) 20 MG tablet Take 1 tablet by mouth daily.     lovastatin (MEVACOR) 40 MG tablet Take 20 mg by mouth at bedtime.      metFORMIN (GLUCOPHAGE) 500 MG tablet 500 mg 2 (two) times daily with a meal.      omeprazole (PRILOSEC) 20 MG  capsule Take 20 mg by mouth daily.     traMADol (ULTRAM) 50 MG tablet TAKE 1 TABLET BY MOUTH THREE TIMES A DAY AS NEEDED 60 tablet 0   amoxicillin (AMOXIL) 500 MG capsule TAKE 4 CAPSULES 1 HOUR PRIOR TO DENTAL APPT.     lenalidomide (REVLIMID) 2.5 MG capsule TAKE 1 CAPSULE BY MOUTH ONCE DAILY FOR 21 DAYS ON AND THEN 7 DAYS OFF (Patient not taking: Reported on 03/26/2019) 21 capsule 0   No current facility-administered medications for this visit.     Review of Systems  Constitutional: Positive for weight loss (about 10 lbs). Negative for chills, diaphoresis, fever and malaise/fatigue.       Feels okay.  HENT: Negative for congestion, hearing loss, sinus pain and sore throat.   Eyes: Negative for blurred vision.  Respiratory: Positive for shortness of breath (exertional). Negative for cough, hemoptysis and sputum production.        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   Cardiovascular: Positive for leg swelling (bilateral ankles). Negative for palpitations, orthopnea and PND.  Gastrointestinal: Positive for constipation (occasional). Negative for abdominal pain, blood in stool, diarrhea, melena, nausea and vomiting.       Loss of appetite.  Genitourinary: Negative for dysuria, flank pain, frequency, hematuria and urgency.  Musculoskeletal: Positive for  back pain (spinal stenosis, recent fall) and falls (mid-April). Negative for joint pain, myalgias and neck pain.  Skin: Negative for itching and rash.  Neurological: Positive for weakness (difficulty standing). Negative for dizziness, tremors, speech change, focal weakness and headaches.       Requires cane for ambulation.  Endo/Heme/Allergies: Does not bruise/bleed easily.       Diabetes.  Psychiatric/Behavioral: Negative for depression and memory loss. The patient has insomnia (sleeps during daytime, awake at night). The patient is not nervous/anxious.   All other systems reviewed and are negative.  Performance status (ECOG): 2-3  Physical Exam  Constitutional: He is oriented to person, place, and time. He appears well-developed and well-nourished. No distress.  HENT:  Short white hair. Male pattern baldness.  Eyes: Pupils are equal, round, and reactive to light. Conjunctivae and EOM are normal. No scleral icterus.  Neurological: He is alert and oriented to person, place, and time.  Psychiatric: He has a normal mood and affect. His behavior is normal. Judgment and thought content normal.  Nursing note reviewed.   No visits with results within 3 Day(s) from this visit.  Latest known visit with results is:  Appointment on 04/12/2019  Component Date Value Ref Range Status   Total Protein ELP 04/12/2019 6.8  6.0 - 8.5 g/dL Final   Albumin ELP 04/12/2019 3.1  2.9 - 4.4 g/dL Final   Alpha-1-Globulin 04/12/2019 0.3  0.0 - 0.4 g/dL Final   Alpha-2-Globulin 04/12/2019 0.8  0.4 - 1.0 g/dL Final   Beta Globulin 04/12/2019 1.3  0.7 - 1.3 g/dL Final   Gamma Globulin 04/12/2019 1.3  0.4 - 1.8 g/dL Final   M-Spike, % 04/12/2019 0.3* Not Observed g/dL Final   SPE Interp. 04/12/2019 Comment   Final   Comment: (NOTE) Faint band in gamma region suspicious for monoclonal immunoglobulin. This band may represent a benign spike as seen in older people or could be a paraprotein as seen in  Multiple Myeloma, Waldenstrom's Macroglobulinemia or Lymphoma. Depending on clinical circumstances, further diagnostic studies may include serum immunofixation or serum free light chain quantitation. Performed At: [facility] [place], [place] [id number] Rush Farmer MD EZ:[id number]  Comment 04/12/2019 Comment   Final   Comment: (NOTE) Protein electrophoresis scan will follow via computer, mail, or courier delivery.    Globulin, Total 04/12/2019 3.7  2.2 - 3.9 g/dL Corrected   A/G Ratio 04/12/2019 0.8  0.7 - 1.7 Corrected   WBC 04/12/2019 3.3* 4.0 - 10.5 K/uL Final   RBC 04/12/2019 2.55* 4.22 - 5.81 MIL/uL Final   Hemoglobin 04/12/2019 9.6* 13.0 - 17.0 g/dL Final   HCT 04/12/2019 27.9* 39.0 - 52.0 % Final   MCV 04/12/2019 109.4* 80.0 - 100.0 fL Final   MCH 04/12/2019 37.6* 26.0 - 34.0 pg Final   MCHC 04/12/2019 34.4  30.0 - 36.0 g/dL Final   RDW 04/12/2019 13.2  11.5 - 15.5 % Final   Platelets 04/12/2019 145* 150 - 400 K/uL Final   nRBC 04/12/2019 0.0  0.0 - 0.2 % Final   Neutrophils Relative % 04/12/2019 50  % Final   Neutro Abs 04/12/2019 1.6* 1.7 - 7.7 K/uL Final   Lymphocytes Relative 04/12/2019 32  % Final   Lymphs Abs 04/12/2019 1.1  0.7 - 4.0 K/uL Final   Monocytes Relative 04/12/2019 13  % Final   Monocytes Absolute 04/12/2019 0.4  0.1 - 1.0 K/uL Final   Eosinophils Relative 04/12/2019 4  % Final   Eosinophils Absolute 04/12/2019 0.1  0.0 - 0.5 K/uL Final   Basophils Relative 04/12/2019 1  % Final   Basophils Absolute 04/12/2019 0.0  0.0 - 0.1 K/uL Final   Immature Granulocytes 04/12/2019 0  % Final   Abs Immature Granulocytes 04/12/2019 0.01  0.00 - 0.07 K/uL Final   Performed at [facility], [address], [place], [place] [address]   Sodium 04/12/2019 133* 135 - 145 mmol/L Final   Potassium 04/12/2019 4.9  3.5 - 5.1 mmol/L Final   Chloride 04/12/2019 103  98 - 111 mmol/L Final   CO2 04/12/2019  21* 22 - 32 mmol/L Final   Glucose, Bld 04/12/2019 120* 70 - 99 mg/dL Final   BUN 04/12/2019 24* 8 - 23 mg/dL Final   Creatinine, Ser 04/12/2019 1.08  0.61 - 1.24 mg/dL Final   Calcium 04/12/2019 9.8  8.9 - 10.3 mg/dL Final   Total Protein 04/12/2019 7.2  6.5 - 8.1 g/dL Final   Albumin 04/12/2019 3.2* 3.5 - 5.0 g/dL Final   AST 04/12/2019 22  15 - 41 U/L Final   ALT 04/12/2019 13  0 - 44 U/L Final   Alkaline Phosphatase 04/12/2019 59  38 - 126 U/L Final   Total Bilirubin 04/12/2019 1.0  0.3 - 1.2 mg/dL Final   GFR calc non Af Amer 04/12/2019 >60  >60 mL/min Final   GFR calc Af Amer 04/12/2019 >60  >60 mL/min Final   Anion gap 04/12/2019 9  5 - 15 Final   Performed at [facility], [address], [place], [place] [address]   Retic Ct Pct 04/12/2019 1.5  0.4 - 3.1 % Final   RBC. 04/12/2019 2.57* 4.22 - 5.81 MIL/uL Final   Retic Count, Absolute 04/12/2019 38.6  19.0 - 186.0 K/uL Final   Immature Retic Fract 04/12/2019 16.0* 2.3 - 15.9 % Final   Performed at [facility], [address] [place], [place] [address]    Assessment:  Rushawn Capshaw. is a 73 y.o. male with stage III multiple myeloma. He presented in 02/2013 with left-sided sharp pain. Evaluation revealed wide-spread lytic lesions including fracture of the left 5thrib laterally. CT scans on  02/22/2013 showed innumerable lytic lesions in thoracic spine, sternum, clavicle, scapula, and ribs.   Bone marrow biopsyon 03/13/2013 revealed 15 to 20% plasma cells. Iron stores were absent. Renal function was normal. SPEP on 03/01/2013 revealed a 1.4 gm/dL IgG monoclonal lambda. IgG was 1987.   He began induction with Revlimid25 mg a day (3 weeks on and 1 week off) and Decadron (40 mg once a week) on 04/09/2013. SPEPhas revealed no monoclonal protein 07/26/2014 - 03/22/2019.M-spike was 0.3 gm/dL on 04/12/2019. IgGwas 1362 on 07/26/2014 and 1097 on 12/19/2014.  Lambda free light  chainshave been monitored: 33.20 (ratio of 1.56) on 09/20/2014, 31.51 (ratio of 1.43) on 12/19/2014, 30.25 (ratio of 1.56) on 05/01/2015, 31.95 (ratio 1.49) on 01/01/2016, 28.02 (ratio 1.29) on 03/25/2016, 31.5 (ratio 1.26) on 05/28/2016, 21.8 (ratio 1.35) on 08/20/2016, 37.3 (ratio 1.09) on 10/22/2016, 46.3 (ratio 1.49) on 10/21/2017, 54 (ratio 1.84) on 04/14/2018, 66.9 (ratio 1.64) on 11/13/2018, and 87.4 (ratio 1.54) on 02/20/2019.  24 hour UPEPon 12/02/2015 revealed no monoclonal protein.  With remission, Revlimidwas decreased to 10 mg a day then to 10 mg every other day secondary to issues with renal function and diarrhea. Current Revlimid is 2.5 mg a day (3 weeks on/1 week off). Last cycle of Revlimid started on 10/27/2018.  Bone surveyon 05/30/2015 revealed the majority of small lytic lesions were stable. There was possibly new lesion in the distal left clavicle and right mid femur (small) and a possible developing lucency in the proximal left femoral shaft. The calvarial lesion noted on the prior study was not well seen (? positional). Bone surveyon 11/27/2015 revealed widespread bony lytic lesions consistent with multiple myeloma. The vast majority of lesions were stable. A few small lesions were not previously seen. One lesion was previously obscured by bowel contents. Two small lesions in the right distal femoral diaphysis appeared new. Bone surveyon 05/25/2016 revealed no evidence of progressive myelomatous involvement of the skeleton. Bone surveyon 05/16/2017 revealed multiple lucent lesions as previously seen. There was no convincing evidence of progression or superimposed abnormality since prior study. Bone surveyon 06/01/2018 revealed scattered lucent lesions, with no evidence for progression.   Lumbar spine MRI without contrast on 04/06/2019 revealed acute to subacute compression fracture at T11 affecting the superior endplate region with a fluid-filled cavity.  There was considerable bone marrow edema. This was favored to represent a benign fracture. However, in this clinical setting, that cannot be stated with absolute certainty. No evidence of other myeloma foci in the lower thoracic or lumbar region. There is a 3 cm cystic focus within the left iliac bone most consistent with a previous treated myeloma focus. There was multifactorial spinal stenosis at L4-5 with potential for neural compression on either or both sides, somewhat worse on the right.  He received Zometaevery 3 months (last 02/28/2017). He takes calcium 4 pills/day. He receives B12every 6 weeks (last 03/22/2019). B12 was 370 on 01/17/2015 and 493 on 10/13/2018. Folatewas 17.5 on 09/17/2016 and 35 on 10/13/2018. TSH was normal on 07/07/2018.  Abdominal and pelvic CT scanon 07/15/2015 revealed extensive sigmoid diverticulosis. There was questionable wall thickening of the ascending colon versus artifact from incomplete distention. Colonoscopyon 11/11/2015 revealed diverticulosis in the sigmoid colon and distal descending colon and ascending colon. There was a 2 mm polyp in the mid sigmoid colon. Pathology revealed a hyperplastic polyp, negative for dysplasia or malignancy.  Symptomatically, he continues to have back pain from his fall in 02/2019.  Plan: 1. Review labs from 04/12/2019: CBC w/ diff, CMP,  SPEP.  2. Multiple myeloma Clinically he is doing fairly well. M-spike 0.3 gm/dL (new). Revlimid has been on hold recently. Restart Revlimid. Bone surveyon 06/02/2019. 3. Macrocytic anemia Etiology remains unclear. B12, folate, and TSH were normal on 10/13/2018. He may have an underlying myelodysplastic disorder. Additional labs on 04/19/2019. Patient notes that he has cut back on alcohol tremendously. Discuss plans for repeat bone marrow if counts remain low. 4. B12 deficiency Patient receives B12 monthly (last 03/22/2019).  RTC on 04/19/2023 for B12. 5.   Renal  insufficiency             Patient's creatinine back to baseline (1.08) after discontinuation of ibuprofen.  Continue to monitor. 6.   RTC on 04/19/2019 for labs (retic, LDH, haptoglobin, folate, TSH, copper, ferritin, sed rate) and B12 injection. 7.   RTC on 05/17/2019 for MD assessment and labs (CBC with diff, CMP, SPEP, FLCA).  I discussed the assessment and treatment plan with the patient.  The patient was provided an opportunity to ask questions and all were answered.  The patient agreed with the plan and demonstrated an understanding of the instructions.  The patient was advised to call back if the symptoms worsen or if the condition fails to improve as anticipated.  I provided 25 minutes of face-to-face time during this this encounter and > 50% was spent counseling as documented under my assessment and plan.    Lequita Asal, MD, PhD    04/16/2019, 12:05 PM  I, Molly Dorshimer, am acting as Education administrator for [employer]. Mike Gip, MD, PhD.  I, Markel Kurtenbach C. Mike Gip, MD, have reviewed the above documentation for accuracy and completeness, and I agree with the above.

## 2019-04-16 ENCOUNTER — Encounter: Payer: Self-pay | Admitting: Hematology and Oncology

## 2019-04-16 ENCOUNTER — Inpatient Hospital Stay (HOSPITAL_BASED_OUTPATIENT_CLINIC_OR_DEPARTMENT_OTHER): Payer: Medicare Other | Admitting: Hematology and Oncology

## 2019-04-16 ENCOUNTER — Other Ambulatory Visit: Payer: PRIVATE HEALTH INSURANCE

## 2019-04-16 DIAGNOSIS — C9000 Multiple myeloma not having achieved remission: Secondary | ICD-10-CM | POA: Diagnosis not present

## 2019-04-16 DIAGNOSIS — N289 Disorder of kidney and ureter, unspecified: Secondary | ICD-10-CM

## 2019-04-16 DIAGNOSIS — E538 Deficiency of other specified B group vitamins: Secondary | ICD-10-CM | POA: Diagnosis not present

## 2019-04-16 DIAGNOSIS — D539 Nutritional anemia, unspecified: Secondary | ICD-10-CM | POA: Diagnosis not present

## 2019-04-16 NOTE — Progress Notes (Signed)
The patient c/o lower back pain due to fall in April 2020 but will see pain MD later part of this week. He still present his pain level 4-10 depends on movement. The patient Name and DOB has been verified by phone today.

## 2019-04-18 ENCOUNTER — Other Ambulatory Visit: Payer: Self-pay

## 2019-04-19 ENCOUNTER — Ambulatory Visit: Payer: PRIVATE HEALTH INSURANCE

## 2019-04-19 ENCOUNTER — Inpatient Hospital Stay: Payer: Medicare Other

## 2019-04-19 VITALS — BP 117/71 | HR 73 | Temp 96.9°F | Resp 18

## 2019-04-19 DIAGNOSIS — E538 Deficiency of other specified B group vitamins: Secondary | ICD-10-CM

## 2019-04-19 DIAGNOSIS — C9000 Multiple myeloma not having achieved remission: Secondary | ICD-10-CM | POA: Diagnosis not present

## 2019-04-19 DIAGNOSIS — N289 Disorder of kidney and ureter, unspecified: Secondary | ICD-10-CM

## 2019-04-19 DIAGNOSIS — D539 Nutritional anemia, unspecified: Secondary | ICD-10-CM

## 2019-04-19 LAB — FOLATE: Folate: 26 ng/mL (ref >5.9)

## 2019-04-19 LAB — TSH: TSH: 2.266 u[IU]/mL (ref 0.350–4.500)

## 2019-04-19 LAB — RETICULOCYTES
Immature Retic Fract: 13 % (ref 2.3–15.9)
RBC.: 2.59 MIL/uL — ABNORMAL LOW (ref 4.22–5.81)
Retic Count, Absolute: 49 10*3/uL (ref 19.0–186.0)
Retic Ct Pct: 1.9 % (ref 0.4–3.1)

## 2019-04-19 LAB — LACTATE DEHYDROGENASE: LDH: 113 U/L (ref 98–192)

## 2019-04-19 LAB — FERRITIN: Ferritin: 313 ng/mL (ref 24–336)

## 2019-04-19 LAB — SEDIMENTATION RATE: Sed Rate: 53 mm/hr — ABNORMAL HIGH (ref 0–20)

## 2019-04-19 MED ORDER — CYANOCOBALAMIN 1000 MCG/ML IJ SOLN
1000.0000 ug | Freq: Once | INTRAMUSCULAR | Status: AC
  Administered 2019-04-19: 1000 ug via INTRAMUSCULAR

## 2019-04-20 LAB — HAPTOGLOBIN: Haptoglobin: 76 mg/dL (ref 34–355)

## 2019-04-23 LAB — COPPER, SERUM: Copper: 98 ug/dL (ref 72–166)

## 2019-05-15 NOTE — Progress Notes (Signed)
[facility]  [address], [address] [place], [place] [address] Phone: [phone]  Fax: [phone]   Clinic Day:  05/17/2019  Referring physician: Tracie Harrier, MD  Chief Complaint: Luis Parcel. is a 73 y.o. male with multiple myeloma who is seen for 1 month assessment on Revlimid.   HPI: The patient was last seen in the medical oncology clinic on 04/16/2019. At that time, he continued to have back pain from his fall in 02/2019.  Hematocrit was 27.9, hemoglobin 9.6, MCV 109.4, platelets 145,000, WBC 3300 and ANC 1600.  He received a B12 injection on 04/19/2019.  Additional labs on 04/19/2019: Ferritin 313, folate 26.0, TSH 2.266, copper 98, sed rate 53, retic 1.9%, LDH 113, haptoglobin 76.  During the interim, the patient is doing okay. He is accompanied by his wife Luis [name] via phone. He states "I have not taken Revlimid for a long time now". He notes fatigued, low energy, and back pain. He reports consideration for an upcoming kyphoplasty. He takes tylenol and tramadol for pain. He denies any fevers, cough, and chills.  He reports a resting tremor in his hands and it worse in his left hand compared to his right hand. He notes edema in his legs and is taking lasix prescribed by Dr. Marcelino Scot.  He stopped taking Cymbalta because it messed with his thought process; he states "It made me think crazy".  He reports mood changes and states "I feel depressed sometimes because of what I have going on, and I can't go out". He states that he misses his grandchildren. He enjoys sitting outside on the porch.   Wife reports he sleeps all the time and she makes him wake up and try to eat. His weight is down 8 pounds and he has no appetite. He is interested in taking ensure.  His wife will make sure he gets some ensure. He is interested in speaking with a nutritionist.    Past Medical History:  Diagnosis Date   Angina pectoris ([place])    Anxiety    Arthritis    CHF  (congestive heart failure) ([place])    Coronary artery disease    Depression    Diabetes mellitus without complication ([place])    Patient takes Metformin.   Diverticulosis    GERD (gastroesophageal reflux disease)    Hyperlipidemia    Hypertension    Multiple myeloma ([place])    Multiple myeloma ([place]) 03/27/2015   Myocardial infarction [facility]) April 2001   widowmaker   Shortness of breath dyspnea    Sleep apnea    No CPAP @ present   Spinal stenosis     Past Surgical History:  Procedure Laterality Date   CARDIAC CATHETERIZATION     CARPAL TUNNEL RELEASE     CATARACT EXTRACTION     COLONOSCOPY WITH PROPOFOL N/A 11/11/2015   Procedure: COLONOSCOPY WITH PROPOFOL;  Surgeon: Lollie Sails, MD;  Location[facility] ENDOSCOPY;  Service: Endoscopy;  Laterality: N/A;   CORONARY ARTERY BYPASS GRAFT     EYE SURGERY Bilateral    Cataract Extraction   INGUINAL HERNIA REPAIR     JOINT REPLACEMENT Right 2008   Right Total Hip Replacement   PILONIDAL CYST EXCISION     ROTATOR CUFF REPAIR     TOTAL HIP ARTHROPLASTY Right    VENTRAL HERNIA REPAIR N/A 08/15/2015   Procedure: VENTRAL HERNIA REPAIR WITH MESH ;  Surgeon: Leonie Green, MD;  Location: [facility] ORS;  Service: General;  Laterality: N/A;  Family History  Problem Relation Age of Onset   Heart disease Father    Stroke Mother    Prostate cancer Maternal Grandfather 5    Social History:  reports that he quit smoking about 19 years ago. His smoking use included cigarettes. He has a 60.00 pack-year smoking history. He quit smokeless tobacco use about 19 years ago. He reports that he does not drink alcohol or use drugs. He drinks 2 glasses wine/day. He notes that his wife goes off to work. He states that his wife would like to retire, but that would affect his medication coverage. He has projects that he likes to do at home. He does wood working. He has a class on Wednesdays (wood turning). His 42nd high school  graduation anniversary was in 06/2016. He has a new grand-daughter, Luis [name]. He had his 50th anniversary. His wife's name is Luis [name]. The patient is accompanied by wife Luis [name] via phone [phone]) today.  Allergies:  Allergies  Allergen Reactions   Pravachol [Pravastatin]    Pravastatin Sodium Other (See Comments)   Statins Other (See Comments)    Muscle aches   Zocor [Simvastatin] Other (See Comments)    Muscle aches    Current Medications: Current Outpatient Medications  Medication Sig Dispense Refill   ALPRAZolam (XANAX) 0.25 MG tablet Take 0.25 mg by mouth at bedtime as needed. for sleep  3   aspirin 81 MG tablet Take 81 mg by mouth daily.      Bee Pollen 500 MG CHEW Chew 1 tablet by mouth 2 (two) times daily.      Calcium Carbonate-Vitamin D 600-400 MG-UNIT chew tablet Chew 2 tablets by mouth daily.      Cyanocobalamin (VITAMIN B-12 IJ) Inject 1 Dose as directed every 30 (thirty) days.      DULoxetine (CYMBALTA) 20 MG capsule Take by mouth.     ergocalciferol (VITAMIN D2) 50000 UNITS capsule Take 50,000 Units by mouth once a week.     glucose blood (ONE TOUCH ULTRA TEST) test strip USE ONCE DAILY. USE AS INSTRUCTED. DX E11.9     isosorbide mononitrate (IMDUR) 30 MG 24 hr tablet Take 30 mg by mouth daily.     lisinopril (PRINIVIL,ZESTRIL) 20 MG tablet Take 1 tablet by mouth daily.     lovastatin (MEVACOR) 40 MG tablet Take 20 mg by mouth at bedtime.      metFORMIN (GLUCOPHAGE) 500 MG tablet 500 mg 2 (two) times daily with a meal.      omeprazole (PRILOSEC) 20 MG capsule Take 20 mg by mouth daily.     traMADol (ULTRAM) 50 MG tablet TAKE 1 TABLET BY MOUTH THREE TIMES A DAY AS NEEDED 60 tablet 0   amoxicillin (AMOXIL) 500 MG capsule TAKE 4 CAPSULES 1 HOUR PRIOR TO DENTAL APPT.     lenalidomide (REVLIMID) 2.5 MG capsule TAKE 1 CAPSULE BY MOUTH ONCE DAILY FOR 21 DAYS ON AND THEN 7 DAYS OFF (Patient not taking: Reported on 05/17/2019) 21 capsule 0   No current  facility-administered medications for this visit.     Review of Systems  Constitutional: Positive for malaise/fatigue (low energy, no appetite) and weight loss (8 lbs). Negative for chills, diaphoresis and fever.       Doing "fair".  HENT: Negative for congestion, hearing loss, sinus pain and sore throat.   Eyes: Negative for blurred vision.  Respiratory: Positive for shortness of breath (exertional). Negative for cough, hemoptysis and sputum production.        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   Cardiovascular: Positive for leg swelling (bilateral ankles). Negative for palpitations, orthopnea and PND.  Gastrointestinal: Positive for constipation (occasional). Negative for abdominal pain, blood in stool, diarrhea, melena, nausea and vomiting.       Loss of appetite.  Genitourinary: Negative for dysuria, flank pain, frequency, hematuria and urgency.  Musculoskeletal: Positive for back pain (spinal stenosis, recent fall) and falls (mid-April). Negative for joint pain, myalgias and neck pain.  Skin: Negative for itching and rash.  Neurological: Positive for tremors (both hands, worse in left hand) and weakness (difficulty standing). Negative for dizziness, speech change, focal weakness and headaches.       Requires cane for ambulation.  Endo/Heme/Allergies: Does not bruise/bleed easily.       Diabetes.  Psychiatric/Behavioral: Positive for depression. Negative for memory loss. The patient has insomnia (sleeps during daytime, awake at night). The patient is not nervous/anxious.   All other systems reviewed and are negative.   Performance status (ECOG): 2-3  Vital Signs Blood pressure 123/68, pulse 70, temperature 97.6 F (36.4 C), temperature source Tympanic, resp. rate 18, height _0  (1.702 m), weight 203 lb 4.2 oz (92.2 kg), SpO2 100 %.    Physical Exam  Constitutional: He is oriented to person, place, and time. He appears well-developed and well-nourished. No distress.    HENT:  Head: Normocephalic and atraumatic.  Mouth/Throat: Oropharynx is clear and moist. No oropharyngeal exudate.  Short white hair. Male pattern baldness.  Eyes: Pupils are equal, round, and reactive to light. Conjunctivae and EOM are normal. No scleral icterus.  Neck: Normal range of motion. Neck supple.  Cardiovascular: Normal rate, regular rhythm and normal heart sounds.  No murmur heard. Pulmonary/Chest: Effort normal and breath sounds normal. No respiratory distress.  Abdominal: Soft. Bowel sounds are normal.  Musculoskeletal: Normal range of motion.        General: No tenderness or edema.     Right ankle: He exhibits swelling.     Left ankle: He exhibits swelling.     Comments: Resting tremor in hands.  Lymphadenopathy:    He has no cervical adenopathy.  Neurological: He is alert and oriented to person, place, and time.  Skin: Skin is warm and dry.  Psychiatric: He has a normal mood and affect. His behavior is normal. Judgment and thought content normal.  Nursing note and vitals reviewed.   Appointment on 05/17/2019  Component Date Value Ref Range Status   Sodium 05/17/2019 138  135 - 145 mmol/L Final   Potassium 05/17/2019 4.7  3.5 - 5.1 mmol/L Final   Chloride 05/17/2019 108  98 - 111 mmol/L Final   CO2 05/17/2019 20* 22 - 32 mmol/L Final   Glucose, Bld 05/17/2019 123* 70 - 99 mg/dL Final   BUN 05/17/2019 36* 8 - 23 mg/dL Final   Creatinine, Ser 05/17/2019 1.52* 0.61 - 1.24 mg/dL Final   Calcium 05/17/2019 9.1  8.9 - 10.3 mg/dL Final   Total Protein 05/17/2019 7.7  6.5 - 8.1 g/dL Final   Albumin 05/17/2019 3.6  3.5 - 5.0 g/dL Final   AST 05/17/2019 24  15 - 41 U/L Final   ALT 05/17/2019 14  0 - 44 U/L Final   Alkaline Phosphatase 05/17/2019 53  38 - 126 U/L Final   Total Bilirubin 05/17/2019 0.8  0.3 - 1.2 mg/dL Final   GFR calc non Af Amer 05/17/2019 44* >60 mL/min Final   GFR calc Af Amer 05/17/2019 52* >60 mL/min Final  Anion gap 05/17/2019 10   5 - 15 Final   Performed at [facility], [address], [place], [place] [address]   WBC 05/17/2019 3.4* 4.0 - 10.5 K/uL Final   RBC 05/17/2019 2.73* 4.22 - 5.81 MIL/uL Final   Hemoglobin 05/17/2019 10.2* 13.0 - 17.0 g/dL Final   HCT 05/17/2019 30.3* 39.0 - 52.0 % Final   MCV 05/17/2019 111.0* 80.0 - 100.0 fL Final   MCH 05/17/2019 37.4* 26.0 - 34.0 pg Final   MCHC 05/17/2019 33.7  30.0 - 36.0 g/dL Final   RDW 05/17/2019 13.9  11.5 - 15.5 % Final   Platelets 05/17/2019 141* 150 - 400 K/uL Final   nRBC 05/17/2019 0.0  0.0 - 0.2 % Final   Neutrophils Relative % 05/17/2019 41  % Final   Neutro Abs 05/17/2019 1.4* 1.7 - 7.7 K/uL Final   Lymphocytes Relative 05/17/2019 39  % Final   Lymphs Abs 05/17/2019 1.3  0.7 - 4.0 K/uL Final   Monocytes Relative 05/17/2019 14  % Final   Monocytes Absolute 05/17/2019 0.5  0.1 - 1.0 K/uL Final   Eosinophils Relative 05/17/2019 5  % Final   Eosinophils Absolute 05/17/2019 0.2  0.0 - 0.5 K/uL Final   Basophils Relative 05/17/2019 1  % Final   Basophils Absolute 05/17/2019 0.0  0.0 - 0.1 K/uL Final   Immature Granulocytes 05/17/2019 0  % Final   Abs Immature Granulocytes 05/17/2019 0.00  0.00 - 0.07 K/uL Final   Performed at [facility], [address], [place], [place] [address]    Assessment:  Luis Mccartt. is a 73 y.o. male  with stage III multiple myeloma. He presented in 02/2013 with left-sided sharp pain. Evaluation revealed wide-spread lytic lesions including fracture of the left 5thrib laterally. CT scans on 02/22/2013 showed innumerable lytic lesions in thoracic spine, sternum, clavicle, scapula, and ribs.   Bone marrow biopsyon 03/13/2013 revealed 15 to 20% plasma cells. Iron stores were absent. Renal function was normal. SPEP on 03/01/2013 revealed a 1.4 gm/dL IgG monoclonal lambda. IgG was 1987.   He began induction with Revlimid25 mg a day (3 weeks on and 1 week off) and  Decadron (40 mg once a week) on 04/09/2013. SPEPhas revealed no monoclonal protein 07/26/2014 - 03/22/2019.M-spike was 0.3 gm/dL on 04/12/2019. IgGwas 1362 on 07/26/2014 and 1097 on 12/19/2014.  Lambda free light chainshave been monitored: 33.20 (ratio of 1.56) on 09/20/2014, 31.51 (ratio of 1.43) on 12/19/2014, 30.25 (ratio of 1.56) on 05/01/2015, 31.95 (ratio 1.49) on 01/01/2016, 28.02 (ratio 1.29) on 03/25/2016, 31.5 (ratio 1.26) on 05/28/2016, 21.8 (ratio 1.35) on 08/20/2016, 37.3 (ratio 1.09) on 10/22/2016, 46.3 (ratio 1.49) on 10/21/2017, 54 (ratio 1.84) on 04/14/2018, 66.9 (ratio 1.64) on 11/13/2018, and 87.4 (ratio 1.54) on 02/20/2019.  24 hour UPEPon 12/02/2015 revealed no monoclonal protein.  With remission, Revlimidwas decreased to 10 mg a day then to 10 mg every other day secondary to issues with renal function and diarrhea. Current Revlimid is 2.5 mg a day (3 weeks on/1 week off). Last cycle of Revlimid started on 10/27/2018.  Bone surveyon 05/30/2015 revealed the majority of small lytic lesions were stable. There was possibly new lesion in the distal left clavicle and right mid femur (small) and a possible developing lucency in the proximal left femoral shaft. The calvarial lesion noted on the prior study was not well seen (? positional). Bone surveyon 11/27/2015 revealed widespread bony lytic lesions consistent with multiple myeloma. The vast majority of  lesions were stable. A few small lesions were not previously seen. One lesion was previously obscured by bowel contents. Two small lesions in the right distal femoral diaphysis appeared new. Bone surveyon 05/25/2016 revealed no evidence of progressive myelomatous involvement of the skeleton. Bone surveyon 05/16/2017 revealed multiple lucent lesions as previously seen. There was no convincing evidence of progression or superimposed abnormality since prior study. Bone surveyon 06/01/2018 revealed scattered lucent  lesions, with no evidence for progression.   Lumbar spine MRI without contrast on 04/06/2019 revealed acute to subacute compression fracture at T11 affecting the superior endplate region with a fluid-filled cavity. There was considerable bone marrow edema. This was favored to represent a benign fracture. However, in this clinical setting, that cannot be stated with absolute certainty. No evidence of other myeloma foci in the lower thoracic or lumbar region. There is a 3 cm cystic focus within the left iliac bone most consistent with a previous treated myeloma focus. There was multifactorial spinal stenosis at L4-5 with potential for neural compression on either or both sides, somewhat worse on the right.  He received Zometaevery 3 months (last 02/28/2017). He takes calcium 4 pills/day. He receives B12every 6 weeks (last 03/22/2019). B12 was 370 on 01/17/2015 and 493 on 10/13/2018. Folatewas 17.5 on 09/17/2016 and 35 on 10/13/2018. TSH was normal on 07/07/2018.  Abdominal and pelvic CT scanon 07/15/2015 revealed extensive sigmoid diverticulosis. There was questionable wall thickening of the ascending colon versus artifact from incomplete distention. Colonoscopyon 11/11/2015 revealed diverticulosis in the sigmoid colon and distal descending colon and ascending colon. There was a 2 mm polyp in the mid sigmoid colon. Pathology revealed a hyperplastic polyp, negative for dysplasia or malignancy.  Symptomatically, he is sleeping a lot.  His back continues to bother him.  Appetite is poor.  Exam is unremarkable.  Plan: 1.   Review labs from 04/12/2019: CBC w/ diff, CMP, SPEP, FLCA 2.   Multiple myeloma Clinicallyhis myeloma appears stable. M-spike is 0. Revlimid has been on hold. Bone survey planned on 06/02/2019. 3.   Macrocytic anemia Etiology is unclear. B12, folate and TSH were normal on 10/13/2018. Folate, ferritin, and LDH were normal on 04/19/2019. Retic was 1.9%.  TSH was  normal. He may have an underlying myelodysplastic syndrome. He appears to be drinking less alcohol. Repeat bone marrow remains a consideration if counts remain problematic. 4.   B12 deficiency  B12 today and monthly. 5.   Renal insufficiency Creatinine continues to fluctuate.    Creatinine 1.52 today. Baseline 1.08.              Continue to monitor. 6.   Weight loss  Gust importance of caloric intake.    Nutrition consult.   7.   RTC in 2-3 weeks for labs (CBC with diff, retic). 8.   RN or MD to call with lab results. 9.   RTC in 4-6 weeks for MD assessment, labs (CBC with diff, CMP, SPEP), and B12  I discussed the assessment and treatment plan with the patient.  The patient was provided an opportunity to ask questions and all were answered.  The patient agreed with the plan and demonstrated an understanding of the instructions.  The patient was advised to call back if the symptoms worsen or if the condition fails to improve as anticipated.  I provided 15 minutes of face-to-face time during this this encounter and > 50% was spent counseling as documented under my assessment and plan.    Lequita Asal, MD, PhD  05/17/2019, 8:56 AM  I, Selena Batten, am acting as scribe for [employer]. Mike Gip, MD, PhD.  I, Melissa C. Mike Gip, MD, have reviewed the above documentation for accuracy and completeness, and I agree with the above.

## 2019-05-16 ENCOUNTER — Other Ambulatory Visit: Payer: Self-pay

## 2019-05-17 ENCOUNTER — Encounter: Payer: Self-pay | Admitting: Hematology and Oncology

## 2019-05-17 ENCOUNTER — Inpatient Hospital Stay: Payer: Medicare Other

## 2019-05-17 ENCOUNTER — Inpatient Hospital Stay: Payer: Medicare Other | Attending: Hematology and Oncology | Admitting: Hematology and Oncology

## 2019-05-17 VITALS — BP 123/68 | HR 70 | Temp 97.6°F | Resp 18 | Ht 67.0 in | Wt 203.3 lb

## 2019-05-17 DIAGNOSIS — N289 Disorder of kidney and ureter, unspecified: Secondary | ICD-10-CM | POA: Diagnosis not present

## 2019-05-17 DIAGNOSIS — F329 Major depressive disorder, single episode, unspecified: Secondary | ICD-10-CM

## 2019-05-17 DIAGNOSIS — I110 Hypertensive heart disease with heart failure: Secondary | ICD-10-CM | POA: Insufficient documentation

## 2019-05-17 DIAGNOSIS — Z9181 History of falling: Secondary | ICD-10-CM | POA: Diagnosis not present

## 2019-05-17 DIAGNOSIS — Z79899 Other long term (current) drug therapy: Secondary | ICD-10-CM | POA: Diagnosis not present

## 2019-05-17 DIAGNOSIS — D539 Nutritional anemia, unspecified: Secondary | ICD-10-CM | POA: Diagnosis not present

## 2019-05-17 DIAGNOSIS — R630 Anorexia: Secondary | ICD-10-CM | POA: Insufficient documentation

## 2019-05-17 DIAGNOSIS — G4730 Sleep apnea, unspecified: Secondary | ICD-10-CM | POA: Insufficient documentation

## 2019-05-17 DIAGNOSIS — E538 Deficiency of other specified B group vitamins: Secondary | ICD-10-CM

## 2019-05-17 DIAGNOSIS — I509 Heart failure, unspecified: Secondary | ICD-10-CM | POA: Insufficient documentation

## 2019-05-17 DIAGNOSIS — R5381 Other malaise: Secondary | ICD-10-CM | POA: Insufficient documentation

## 2019-05-17 DIAGNOSIS — R251 Tremor, unspecified: Secondary | ICD-10-CM | POA: Insufficient documentation

## 2019-05-17 DIAGNOSIS — C9000 Multiple myeloma not having achieved remission: Secondary | ICD-10-CM | POA: Insufficient documentation

## 2019-05-17 DIAGNOSIS — Z87891 Personal history of nicotine dependence: Secondary | ICD-10-CM | POA: Diagnosis not present

## 2019-05-17 DIAGNOSIS — M549 Dorsalgia, unspecified: Secondary | ICD-10-CM

## 2019-05-17 DIAGNOSIS — I252 Old myocardial infarction: Secondary | ICD-10-CM

## 2019-05-17 DIAGNOSIS — R5383 Other fatigue: Secondary | ICD-10-CM

## 2019-05-17 DIAGNOSIS — R634 Abnormal weight loss: Secondary | ICD-10-CM | POA: Diagnosis not present

## 2019-05-17 DIAGNOSIS — R600 Localized edema: Secondary | ICD-10-CM | POA: Insufficient documentation

## 2019-05-17 DIAGNOSIS — K219 Gastro-esophageal reflux disease without esophagitis: Secondary | ICD-10-CM

## 2019-05-17 DIAGNOSIS — E785 Hyperlipidemia, unspecified: Secondary | ICD-10-CM | POA: Insufficient documentation

## 2019-05-17 DIAGNOSIS — Z7982 Long term (current) use of aspirin: Secondary | ICD-10-CM | POA: Diagnosis not present

## 2019-05-17 LAB — CBC WITH DIFFERENTIAL/PLATELET
Abs Immature Granulocytes: 0 10*3/uL (ref 0.00–0.07)
Basophils Absolute: 0 10*3/uL (ref 0.0–0.1)
Basophils Relative: 1 %
Eosinophils Absolute: 0.2 10*3/uL (ref 0.0–0.5)
Eosinophils Relative: 5 %
HCT: 30.3 % — ABNORMAL LOW (ref 39.0–52.0)
Hemoglobin: 10.2 g/dL — ABNORMAL LOW (ref 13.0–17.0)
Immature Granulocytes: 0 %
Lymphocytes Relative: 39 %
Lymphs Abs: 1.3 10*3/uL (ref 0.7–4.0)
MCH: 37.4 pg — ABNORMAL HIGH (ref 26.0–34.0)
MCHC: 33.7 g/dL (ref 30.0–36.0)
MCV: 111 fL — ABNORMAL HIGH (ref 80.0–100.0)
Monocytes Absolute: 0.5 10*3/uL (ref 0.1–1.0)
Monocytes Relative: 14 %
Neutro Abs: 1.4 10*3/uL — ABNORMAL LOW (ref 1.7–7.7)
Neutrophils Relative %: 41 %
Platelets: 141 10*3/uL — ABNORMAL LOW (ref 150–400)
RBC: 2.73 MIL/uL — ABNORMAL LOW (ref 4.22–5.81)
RDW: 13.9 % (ref 11.5–15.5)
WBC: 3.4 10*3/uL — ABNORMAL LOW (ref 4.0–10.5)
nRBC: 0 % (ref 0.0–0.2)

## 2019-05-17 LAB — COMPREHENSIVE METABOLIC PANEL
ALT: 14 U/L (ref 0–44)
AST: 24 U/L (ref 15–41)
Albumin: 3.6 g/dL (ref 3.5–5.0)
Alkaline Phosphatase: 53 U/L (ref 38–126)
Anion gap: 10 (ref 5–15)
BUN: 36 mg/dL — ABNORMAL HIGH (ref 8–23)
CO2: 20 mmol/L — ABNORMAL LOW (ref 22–32)
Calcium: 9.1 mg/dL (ref 8.9–10.3)
Chloride: 108 mmol/L (ref 98–111)
Creatinine, Ser: 1.52 mg/dL — ABNORMAL HIGH (ref 0.61–1.24)
GFR calc Af Amer: 52 mL/min — ABNORMAL LOW (ref >60)
GFR calc non Af Amer: 44 mL/min — ABNORMAL LOW (ref >60)
Glucose, Bld: 123 mg/dL — ABNORMAL HIGH (ref 70–99)
Potassium: 4.7 mmol/L (ref 3.5–5.1)
Sodium: 138 mmol/L (ref 135–145)
Total Bilirubin: 0.8 mg/dL (ref 0.3–1.2)
Total Protein: 7.7 g/dL (ref 6.5–8.1)

## 2019-05-17 MED ORDER — CYANOCOBALAMIN 1000 MCG/ML IJ SOLN
1000.0000 ug | Freq: Once | INTRAMUSCULAR | Status: AC
  Administered 2019-05-17: 10:00:00 1000 ug via INTRAMUSCULAR

## 2019-05-17 NOTE — Progress Notes (Signed)
Patient c/o lower and upper back pain ( pain level today 4) patient is seeing someone for the pain.decrease in appetite and weakness. Patient has also loss some weight 211 to 203

## 2019-05-18 LAB — KAPPA/LAMBDA LIGHT CHAINS
Kappa free light chain: 95.2 mg/L — ABNORMAL HIGH (ref 3.3–19.4)
Kappa, lambda light chain ratio: 1.62 (ref 0.26–1.65)
Lambda free light chains: 58.8 mg/L — ABNORMAL HIGH (ref 5.7–26.3)

## 2019-05-21 ENCOUNTER — Telehealth: Payer: Self-pay

## 2019-05-21 LAB — PROTEIN ELECTROPHORESIS, SERUM
A/G Ratio: 0.9 (ref 0.7–1.7)
Albumin ELP: 3.3 g/dL (ref 2.9–4.4)
Alpha-1-Globulin: 0.2 g/dL (ref 0.0–0.4)
Alpha-2-Globulin: 0.8 g/dL (ref 0.4–1.0)
Beta Globulin: 1.6 g/dL — ABNORMAL HIGH (ref 0.7–1.3)
Gamma Globulin: 1 g/dL (ref 0.4–1.8)
Globulin, Total: 3.7 g/dL (ref 2.2–3.9)
Total Protein ELP: 7 g/dL (ref 6.0–8.5)

## 2019-05-21 NOTE — Telephone Encounter (Signed)
Spoke with the patient to inform him that his N- spike was 0 with his labs today. The patient was understanding and agreeable.

## 2019-05-21 NOTE — Telephone Encounter (Signed)
-----   Message from Lequita Asal, MD sent at 05/21/2019  4:54 PM EDT ----- Regarding: Please call patient  M-spike is 0 ----- Message ----- From: Interface, Lab In [place] Sent: 05/17/2019   8:16 AM EDT To: Lequita Asal, MD

## 2019-05-22 ENCOUNTER — Telehealth: Payer: Self-pay | Admitting: Hematology and Oncology

## 2019-05-22 NOTE — Telephone Encounter (Signed)
Re:  Lab results  Patient called regarding a right sided nose bleed.  Platelet count was 141,000 on 05/17/2019. M-spike was 0.  Currently nose is not bleeding.  He is temporarily holding his aspirin.    We discussed holding pressure if the nose bleed restarts.  If nose bleed is problematic, we discussed possible packing or cautery.   Lequita Asal, MD

## 2019-06-04 ENCOUNTER — Other Ambulatory Visit: Payer: Self-pay

## 2019-06-05 ENCOUNTER — Inpatient Hospital Stay: Payer: Medicare Other

## 2019-06-05 DIAGNOSIS — C9000 Multiple myeloma not having achieved remission: Secondary | ICD-10-CM

## 2019-06-05 DIAGNOSIS — R634 Abnormal weight loss: Secondary | ICD-10-CM

## 2019-06-05 DIAGNOSIS — D539 Nutritional anemia, unspecified: Secondary | ICD-10-CM

## 2019-06-05 DIAGNOSIS — E538 Deficiency of other specified B group vitamins: Secondary | ICD-10-CM

## 2019-06-05 LAB — CBC WITH DIFFERENTIAL/PLATELET
Abs Immature Granulocytes: 0.01 10*3/uL (ref 0.00–0.07)
Basophils Absolute: 0 10*3/uL (ref 0.0–0.1)
Basophils Relative: 1 %
Eosinophils Absolute: 0.2 10*3/uL (ref 0.0–0.5)
Eosinophils Relative: 5 %
HCT: 29.7 % — ABNORMAL LOW (ref 39.0–52.0)
Hemoglobin: 9.9 g/dL — ABNORMAL LOW (ref 13.0–17.0)
Immature Granulocytes: 0 %
Lymphocytes Relative: 30 %
Lymphs Abs: 1.2 10*3/uL (ref 0.7–4.0)
MCH: 37.2 pg — ABNORMAL HIGH (ref 26.0–34.0)
MCHC: 33.3 g/dL (ref 30.0–36.0)
MCV: 111.7 fL — ABNORMAL HIGH (ref 80.0–100.0)
Monocytes Absolute: 0.6 10*3/uL (ref 0.1–1.0)
Monocytes Relative: 14 %
Neutro Abs: 2 10*3/uL (ref 1.7–7.7)
Neutrophils Relative %: 50 %
Platelets: 130 10*3/uL — ABNORMAL LOW (ref 150–400)
RBC: 2.66 MIL/uL — ABNORMAL LOW (ref 4.22–5.81)
RDW: 14.2 % (ref 11.5–15.5)
WBC: 3.9 10*3/uL — ABNORMAL LOW (ref 4.0–10.5)
nRBC: 0 % (ref 0.0–0.2)

## 2019-06-05 LAB — RETICULOCYTES
Immature Retic Fract: 14.4 % (ref 2.3–15.9)
RBC.: 2.71 MIL/uL — ABNORMAL LOW (ref 4.22–5.81)
Retic Count, Absolute: 46.1 10*3/uL (ref 19.0–186.0)
Retic Ct Pct: 1.7 % (ref 0.4–3.1)

## 2019-06-07 ENCOUNTER — Inpatient Hospital Stay: Payer: Medicare Other

## 2019-06-07 NOTE — Progress Notes (Signed)
Nutrition Assessment   Reason for Assessment:  Referral from Dr Mike Gip for weight loss.     ASSESSMENT:  73 year old male with multiple myeloma. Past medical history of anxiety, CHF, CAD, depression, DM, GERD, HLD, HTN.  Connected with patient via phone this am for nutrition assessment.  Patient reports that his appetite has improved some since he last saw Dr. Mike Gip due to pain being under better control.  Reports extreme back pain has effected appetite and he feels has contributed to weight loss.  Reports that he usually eats brunch of cereal or eggs sometimes bacon, may drink boost and eat granola bar.  Lunch is light with peanut butter crackers or sandwich or breakfast type bar.  Supper is big meal with meat (mostly fish and chicken) and vegetables.  Has been drinking boost original 1 time per day.    Nutrition Focused Physical Exam: deferred   Medications: Calcium and VIt D, metformin, prilosec, vit B 12   Labs: BUN 36, creatinine 1.52, glucose 123   Anthropometrics:   Height: 67 inches Weight: 203 lb on 7/9 UBW: 220s per chart.   211 lb on 5/18 BMI: 31  4% weight loss in the last 2 months, not significant  Patient reports feel like has gained back couple pounds since he last saw Dr. Mike Gip.   Estimated Energy Needs  Kcals: 2956-2130 Protein: 115-135 g Fluid: > 2.3 L   NUTRITION DIAGNOSIS: Unintentional weight loss related to back pain effecting appetite as evidenced by 4% weight loss in 2 months   INTERVENTION:  Discussed strategies to increase calories and protein  Encouraged trying boost plus for additional calories and protein. Will mail coupons Encouraged good sources of protein at every meal.  Discussed examples of protein rich foods.  Contact information provided   MONITORING, EVALUATION, GOAL: Patient will increase calories and protein to prevent further weight loss   Next Visit: phone f/u August 27  Shadd Dunstan B. Zenia Resides, [place], [place] Registered  Dietitian [phone] (pager)

## 2019-06-20 NOTE — Progress Notes (Signed)
[facility]  [address], [address] [place], [place] [address] Phone: [phone]  Fax: [phone]   Clinic Day:  06/22/2019  Referring physician: Tracie Harrier, MD  Chief Complaint: Luis [name]. is a 73 y.o. male with multiple myeloma who is seen for 1 month assessment on Revlimid.   HPI: The patient was last seen in the medical oncology clinic on 05/17/2019. At that time, he was sleeping a lot. His back continued to bother him. Appetite was poor. Exam was unremarkable. M-spike was 0. Platelets were 141,000.   He called the clinic regarding a nosebleed on 05/22/2019. He temporarily held his aspirin. He was advised to call back if the nosebleed restarted.   He was seen by Jennet Maduro, RD on 06/07/2019 regarding recent weight loss. He was advised to use Boost supplemental drinks as necessary and increase protein intake.   Labs on 06/05/2019: hematocrit 29.7, hemoglobin 9.9, MCV 111.7, platelets 130,000, WBC 3,900. Retic count 1.7%.   During the interim, he is doing "fine."  He notes fatigue.  He has been sleeping in a chair, but reports he is getting enough sleep.  He sleeps during the day and has difficulty sleeping at night.  His back pain from spinal stenosis is 4/10 today. His nosebleed has improved with application of a topical cream and only occurs occasionally.  Bilateral lower extremity edema has increased.   He is trying to eat more protein. He has cut back on his alcohol consumption and is currently drinking 2-3 glasses of wine per night or 1-2 glasses of bourbon.    Past Medical History:  Diagnosis Date   Angina pectoris ([place])    Anxiety    Arthritis    CHF (congestive heart failure) ([place])    Coronary artery disease    Depression    Diabetes mellitus without complication ([place])    Patient takes Metformin.   Diverticulosis    GERD (gastroesophageal reflux disease)    Hyperlipidemia    Hypertension    Multiple myeloma ([place])     Multiple myeloma ([place]) 03/27/2015   Myocardial infarction [facility]) April 2001   widowmaker   Shortness of breath dyspnea    Sleep apnea    No CPAP @ present   Spinal stenosis     Past Surgical History:  Procedure Laterality Date   CARDIAC CATHETERIZATION     CARPAL TUNNEL RELEASE     CATARACT EXTRACTION     COLONOSCOPY WITH PROPOFOL N/A 11/11/2015   Procedure: COLONOSCOPY WITH PROPOFOL;  Surgeon: Lollie Sails, MD;  Location: [facility] ENDOSCOPY;  Service: Endoscopy;  Laterality: N/A;   CORONARY ARTERY BYPASS GRAFT     EYE SURGERY Bilateral    Cataract Extraction   INGUINAL HERNIA REPAIR     JOINT REPLACEMENT Right 2008   Right Total Hip Replacement   PILONIDAL CYST EXCISION     ROTATOR CUFF REPAIR     TOTAL HIP ARTHROPLASTY Right    VENTRAL HERNIA REPAIR N/A 08/15/2015   Procedure: VENTRAL HERNIA REPAIR WITH MESH ;  Surgeon: Leonie Green, MD;  Location: [facility] ORS;  Service: General;  Laterality: N/A;    Family History  Problem Relation Age of Onset   Heart disease Father    Stroke Mother    Prostate cancer Maternal Grandfather 56    Social History:  reports that he quit smoking about 19 years ago. His smoking use included cigarettes. He has a 60.00 pack-year smoking history. He quit smokeless tobacco use  about 19 years ago. He reports that he does not drink alcohol or use drugs. He drinks 2 glasses wine/day. He notes that his wife goes off to work. He states that his wife would like to retire, but that would affect his medication coverage. He has projects that he likes to do at home. He does wood working. He has a class on Wednesdays (wood turning). His 42nd high school graduation anniversary was in 06/2016. He has a new grand-daughter, Luis [name]. He had his 50th anniversary. His wife's name is Luis [name] [phone]). The patient is alone today.   Allergies:  Allergies  Allergen Reactions   Pravachol [Pravastatin]    Pravastatin Sodium Other  (See Comments)   Statins Other (See Comments)    Muscle aches   Zocor [Simvastatin] Other (See Comments)    Muscle aches    Current Medications: Current Outpatient Medications  Medication Sig Dispense Refill   ALPRAZolam (XANAX) 0.25 MG tablet Take 0.25 mg by mouth at bedtime as needed. for sleep  3   aspirin 81 MG tablet Take 81 mg by mouth daily.      Bee Pollen 500 MG CHEW Chew 1 tablet by mouth 2 (two) times daily.      Calcium Carbonate-Vitamin D 600-400 MG-UNIT chew tablet Chew 2 tablets by mouth daily.      Cyanocobalamin (VITAMIN B-12 IJ) Inject 1 Dose as directed every 30 (thirty) days.      DULoxetine (CYMBALTA) 20 MG capsule Take by mouth.     ergocalciferol (VITAMIN D2) 50000 UNITS capsule Take 50,000 Units by mouth once a week.     glucose blood (ONE TOUCH ULTRA TEST) test strip USE ONCE DAILY. USE AS INSTRUCTED. DX E11.9     isosorbide mononitrate (IMDUR) 30 MG 24 hr tablet Take 30 mg by mouth daily.     lisinopril (PRINIVIL,ZESTRIL) 20 MG tablet Take 1 tablet by mouth daily.     lovastatin (MEVACOR) 40 MG tablet Take 20 mg by mouth at bedtime.      metFORMIN (GLUCOPHAGE) 500 MG tablet 500 mg 2 (two) times daily with a meal.      omeprazole (PRILOSEC) 20 MG capsule Take 20 mg by mouth daily.     traMADol (ULTRAM) 50 MG tablet TAKE 1 TABLET BY MOUTH THREE TIMES A DAY AS NEEDED 60 tablet 0   amoxicillin (AMOXIL) 500 MG capsule TAKE 4 CAPSULES 1 HOUR PRIOR TO DENTAL APPT.     lenalidomide (REVLIMID) 2.5 MG capsule TAKE 1 CAPSULE BY MOUTH ONCE DAILY FOR 21 DAYS ON AND THEN 7 DAYS OFF (Patient not taking: Reported on 05/17/2019) 21 capsule 0   No current facility-administered medications for this visit.     Review of Systems  Constitutional: Positive for malaise/fatigue (low energy). Negative for chills, diaphoresis, fever and weight loss (up 8 lbs).       Doing "fine".  HENT: Positive for nosebleeds (occasionally). Negative for congestion, hearing loss,  sinus pain and sore throat.   Eyes: Negative for blurred vision.  Respiratory: Positive for shortness of breath (exertional). Negative for cough, hemoptysis and sputum production.        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   Cardiovascular: Positive for orthopnea and leg swelling (bilateral ankles). Negative for chest pain, palpitations and PND.  Gastrointestinal: Negative for abdominal pain, blood in stool, constipation, diarrhea, melena, nausea and vomiting.       Appetite increasing.  Genitourinary: Negative for dysuria, flank pain, frequency, hematuria and urgency.  Musculoskeletal:  Positive for back pain (spinal stenosis, 4/10). Negative for falls, joint pain, myalgias and neck pain.  Skin: Negative for itching and rash.  Neurological: Positive for tremors (both hands, worse in left hand) and weakness (generalized). Negative for dizziness, sensory change, focal weakness and headaches.       Requires cane for ambulation.  Endo/Heme/Allergies: Does not bruise/bleed easily.       Diabetes.  Psychiatric/Behavioral: Negative for depression, memory loss and substance abuse. The patient has insomnia (sleeps during daytime, awake at night). The patient is not nervous/anxious.   All other systems reviewed and are negative.  Performance status (ECOG):  2  Vitals Blood pressure 138/63, pulse 60, temperature 97.8 F (36.6 C), temperature source Tympanic, resp. rate 18, height _0  (1.702 m), weight 211 lb 15.6 oz (96.2 kg), SpO2 98 %.   Physical Exam  Constitutional: He is oriented to person, place, and time. He appears well-developed and well-nourished. No distress.  HENT:  Head: Normocephalic and atraumatic.  Mouth/Throat: Oropharynx is clear and moist. No oropharyngeal exudate.  Short white hair. Male pattern baldness.  Eyes: Pupils are equal, round, and reactive to light. Conjunctivae and EOM are normal. No scleral icterus.  Blue eyes.  Neck: Normal range of motion. Neck supple.  No JVD present.  Cardiovascular: Normal rate, regular rhythm and normal heart sounds. Exam reveals no friction rub.  No murmur heard. Pulmonary/Chest: Effort normal and breath sounds normal. No respiratory distress. He has no wheezes. He has no rales.  Abdominal: Soft. Bowel sounds are normal. He exhibits no distension and no mass. There is no abdominal tenderness. There is no rebound and no guarding.  Musculoskeletal: Normal range of motion.        General: Edema (2+ chronic BLE) present. No tenderness.  Lymphadenopathy:    He has no cervical adenopathy.    He has no axillary adenopathy.       Right: No supraclavicular adenopathy present.       Left: No supraclavicular adenopathy present.  Neurological: He is alert and oriented to person, place, and time.  Resting tremor in hands.  Skin: Skin is warm and dry. No rash noted. He is not diaphoretic. No erythema. No pallor.  Psychiatric: He has a normal mood and affect. His behavior is normal. Judgment and thought content normal.  Nursing note and vitals reviewed.   Appointment on 06/22/2019  Component Date Value Ref Range Status   Sodium 06/22/2019 136  135 - 145 mmol/L Final   Potassium 06/22/2019 5.1  3.5 - 5.1 mmol/L Final   Chloride 06/22/2019 109  98 - 111 mmol/L Final   CO2 06/22/2019 19* 22 - 32 mmol/L Final   Glucose, Bld 06/22/2019 175* 70 - 99 mg/dL Final   BUN 06/22/2019 25* 8 - 23 mg/dL Final   Creatinine, Ser 06/22/2019 1.03  0.61 - 1.24 mg/dL Final   Calcium 06/22/2019 9.5  8.9 - 10.3 mg/dL Final   Total Protein 06/22/2019 7.3  6.5 - 8.1 g/dL Final   Albumin 06/22/2019 3.3* 3.5 - 5.0 g/dL Final   AST 06/22/2019 30  15 - 41 U/L Final   ALT 06/22/2019 17  0 - 44 U/L Final   Alkaline Phosphatase 06/22/2019 58  38 - 126 U/L Final   Total Bilirubin 06/22/2019 0.9  0.3 - 1.2 mg/dL Final   GFR calc non Af Amer 06/22/2019 >60  >60 mL/min Final   GFR calc Af Amer 06/22/2019 >60  >60 mL/min Final   Anion gap  06/22/2019 8  5 - 15 Final   Performed at [facility], [address], [place], [place] [address]   WBC 06/22/2019 3.3* 4.0 - 10.5 K/uL Final   RBC 06/22/2019 2.81* 4.22 - 5.81 MIL/uL Final   Hemoglobin 06/22/2019 10.5* 13.0 - 17.0 g/dL Final   HCT 06/22/2019 30.6* 39.0 - 52.0 % Final   MCV 06/22/2019 108.9* 80.0 - 100.0 fL Final   MCH 06/22/2019 37.4* 26.0 - 34.0 pg Final   MCHC 06/22/2019 34.3  30.0 - 36.0 g/dL Final   RDW 06/22/2019 13.8  11.5 - 15.5 % Final   Platelets 06/22/2019 141* 150 - 400 K/uL Final   nRBC 06/22/2019 0.0  0.0 - 0.2 % Final   Neutrophils Relative % 06/22/2019 43  % Final   Neutro Abs 06/22/2019 1.4* 1.7 - 7.7 K/uL Final   Lymphocytes Relative 06/22/2019 40  % Final   Lymphs Abs 06/22/2019 1.3  0.7 - 4.0 K/uL Final   Monocytes Relative 06/22/2019 10  % Final   Monocytes Absolute 06/22/2019 0.3  0.1 - 1.0 K/uL Final   Eosinophils Relative 06/22/2019 5  % Final   Eosinophils Absolute 06/22/2019 0.2  0.0 - 0.5 K/uL Final   Basophils Relative 06/22/2019 2  % Final   Basophils Absolute 06/22/2019 0.1  0.0 - 0.1 K/uL Final   Immature Granulocytes 06/22/2019 0  % Final   Abs Immature Granulocytes 06/22/2019 0.01  0.00 - 0.07 K/uL Final   Performed at [facility], [address] [place], [place] [address]    Assessment:  Basheer Molchan. is a 74 y.o. male with stage III multiple myeloma. He presented in 02/2013 with left-sided sharp pain. Evaluation revealed wide-spread lytic lesions including fracture of the left 5thrib laterally. CT scans on 02/22/2013 showed innumerable lytic lesions in thoracic spine, sternum, clavicle, scapula, and ribs.   Bone marrow biopsyon 03/13/2013 revealed 15 to 20% plasma cells. Iron stores were absent. Renal function was normal. SPEP on 03/01/2013 revealed a 1.4 gm/dL IgG monoclonal lambda. IgG was 1987.   He began induction with Revlimid25 mg a day (3 weeks on and 1 week  off) and Decadron (40 mg once a week) on 04/09/2013. SPEPhas revealed no monoclonal protein 07/26/2014-06/22/2019.M-spike was 0.3 gm/dL on 04/12/2019.IgGwas 1362 on 07/26/2014 and 1097 on 12/19/2014.  Lambda free light chainshave been monitored: 33.20 (ratio of 1.56) on 09/20/2014, 31.51 (ratio of 1.43) on 12/19/2014, 30.25 (ratio of 1.56) on 05/01/2015, 31.95 (ratio 1.49) on 01/01/2016, 28.02 (ratio 1.29) on 03/25/2016, 31.5 (ratio 1.26) on 05/28/2016, 21.8 (ratio 1.35) on 08/20/2016, 37.3 (ratio 1.09) on 10/22/2016, 46.3 (ratio 1.49) on 10/21/2017, 54 (ratio 1.84) on 04/14/2018, 66.9 (ratio 1.64) on 11/13/2018, 87.4 (ratio 1.54) on 02/20/2019, and 95.2 (ratio 1.62) on 05/17/2019.  24 hour UPEPon 12/02/2015 revealed no monoclonal protein.  With remission, Revlimidwas decreased to 10 mg a day then to 10 mg every other day secondary to issues with renal function and diarrhea. Current Revlimid is 2.5 mg a day (3 weeks on/1 week off). Last cycle of Revlimid started on 10/27/2018.  Bone surveyon 05/30/2015 revealed the majority of small lytic lesions were stable. There was possibly new lesion in the distal left clavicle and right mid femur (small) and a possible developing lucency in the proximal left femoral shaft. The calvarial lesion noted on the prior study was not well seen (? positional). Bone surveyon 11/27/2015 revealed widespread bony lytic lesions consistent with multiple myeloma. The vast majority of lesions were  stable. A few small lesions were not previously seen. One lesion was previously obscured by bowel contents. Two small lesions in the right distal femoral diaphysis appeared new. Bone surveyon 05/25/2016 revealed no evidence of progressive myelomatous involvement of the skeleton. Bone surveyon 05/16/2017 revealed multiple lucent lesions as previously seen. There was no convincing evidence of progression or superimposed abnormality since prior study. Bone  surveyon 06/01/2018 revealed scattered lucent lesions, with no evidence for progression.   Lumbar spineMRIwithout contrast on 04/06/2019 revealed acute to subacute compression fracture at T11 affecting the superior endplate region with a fluid-filled cavity.There was considerable bone marrow edema. Thiswas favored to represent a benign fracture. However, in this clinical setting, that cannot be stated with absolute certainty. No evidence of other myeloma foci in the lower thoracic or lumbar region. There is a 3 cm cystic focus within the left iliac bone most consistent with a previous treated myelomafocus.There was multifactorial spinal stenosis at L4-5 with potential for neural compression on either or both sides, somewhat worse on the right.  He received Zometaevery 3 months (last 02/28/2017). He takes calcium 4 pills/day. He receives B12every 6 weeks (last 03/22/2019). B12 was 370 on 01/17/2015 and 493 on 10/13/2018. Folatewas 17.5 on 09/17/2016 and 35 on 10/13/2018. TSH was normal on 07/07/2018.  Abdominal and pelvic CT scanon 07/15/2015 revealed extensive sigmoid diverticulosis. There was questionable wall thickening of the ascending colon versus artifact from incomplete distention. Colonoscopyon 11/11/2015 revealed diverticulosis in the sigmoid colon and distal descending colon and ascending colon. There was a 2 mm polyp in the mid sigmoid colon. Pathology revealed a hyperplastic polyp, negative for dysplasia or malignancy.  Symptomatically, he is fatigued.  He sleeps during the day and is up at night.  Weight is up.  Exam reveals chronic 2+ lower extremity edema.  Plan: 1.   Labs today: CBC w/ diff, CMP, SPEP. 2.   Multiple myeloma Clinically, his myeloma is stable.   M spike remains 0.   Schedule bone survey. 3.   Macrocytic anemia Hematocrit 30.6.  Hemoglobin 10.5.  MCV 108.9.  Platelets 141,000.  WBC 3300 (ANC 1400)  Etiology remains unclear. B12, folate and TSH  were normal on 10/13/2018. Folate, ferritin, and LDH were normal on 04/19/2019. Retic was 1.9%.  TSH was normal. Concern remains a possible underlying myelodysplastic syndrome. He is still drinking 2-3 glasses of wine per night or 1-2 glasses of bourbon. Possible bone marrow in the future. 4.   B12 deficiency            B12 today and monthly x6. 5.Renal insufficiency Creatinine continues to fluctuate.               Creatinine 1.03 today (improved). Baseline 1.08.   Last month 1.52 Continue to monitor. 6.Weight loss             Weight gain of 8 pounds in the past month.   Some may be fluid weight.    Albumen is 3.3.              Review interval consult with Jennet Maduro, RD.   7.   RTC in 2-3 weeks for labs (CBC with diff, retic). 8.   RTC in 1 month for labs (CBC with diff, BMP, SPEP), and B12. 9.   RTC in 2 months for MD assessment and labs (CBC with diff, CMP, SPEP).  I discussed the assessment and treatment plan with the patient.  The patient was provided an opportunity to ask questions and  all were answered.  The patient agreed with the plan and demonstrated an understanding of the instructions.  The patient was advised to call back if the symptoms worsen or if the condition fails to improve as anticipated.  I provided 25 minutes of face-to-face time during this this encounter and > 50% was spent counseling as documented under my assessment and plan.    Lequita Asal, MD, PhD    06/22/2019, 9:35 AM  I, Molly Dorshimer, am acting as Education administrator for [employer]. Mike Gip, MD, PhD.  I, Rocquel Askren C. Mike Gip, MD, have reviewed the above documentation for accuracy and completeness, and I agree with the above.

## 2019-06-21 ENCOUNTER — Other Ambulatory Visit: Payer: Self-pay

## 2019-06-22 ENCOUNTER — Encounter: Payer: Self-pay | Admitting: Hematology and Oncology

## 2019-06-22 ENCOUNTER — Inpatient Hospital Stay: Payer: Medicare Other

## 2019-06-22 ENCOUNTER — Inpatient Hospital Stay: Payer: Medicare Other | Attending: Hematology and Oncology | Admitting: Hematology and Oncology

## 2019-06-22 VITALS — BP 138/63 | HR 60 | Temp 97.8°F | Resp 18 | Ht 67.0 in | Wt 212.0 lb

## 2019-06-22 DIAGNOSIS — Z7984 Long term (current) use of oral hypoglycemic drugs: Secondary | ICD-10-CM | POA: Insufficient documentation

## 2019-06-22 DIAGNOSIS — M129 Arthropathy, unspecified: Secondary | ICD-10-CM | POA: Diagnosis not present

## 2019-06-22 DIAGNOSIS — I252 Old myocardial infarction: Secondary | ICD-10-CM | POA: Insufficient documentation

## 2019-06-22 DIAGNOSIS — I2510 Atherosclerotic heart disease of native coronary artery without angina pectoris: Secondary | ICD-10-CM | POA: Insufficient documentation

## 2019-06-22 DIAGNOSIS — D539 Nutritional anemia, unspecified: Secondary | ICD-10-CM

## 2019-06-22 DIAGNOSIS — R634 Abnormal weight loss: Secondary | ICD-10-CM

## 2019-06-22 DIAGNOSIS — Z8042 Family history of malignant neoplasm of prostate: Secondary | ICD-10-CM | POA: Insufficient documentation

## 2019-06-22 DIAGNOSIS — K219 Gastro-esophageal reflux disease without esophagitis: Secondary | ICD-10-CM | POA: Insufficient documentation

## 2019-06-22 DIAGNOSIS — D125 Benign neoplasm of sigmoid colon: Secondary | ICD-10-CM | POA: Diagnosis not present

## 2019-06-22 DIAGNOSIS — E538 Deficiency of other specified B group vitamins: Secondary | ICD-10-CM

## 2019-06-22 DIAGNOSIS — I10 Essential (primary) hypertension: Secondary | ICD-10-CM | POA: Diagnosis not present

## 2019-06-22 DIAGNOSIS — E785 Hyperlipidemia, unspecified: Secondary | ICD-10-CM | POA: Insufficient documentation

## 2019-06-22 DIAGNOSIS — F418 Other specified anxiety disorders: Secondary | ICD-10-CM | POA: Diagnosis not present

## 2019-06-22 DIAGNOSIS — N289 Disorder of kidney and ureter, unspecified: Secondary | ICD-10-CM

## 2019-06-22 DIAGNOSIS — E119 Type 2 diabetes mellitus without complications: Secondary | ICD-10-CM | POA: Diagnosis not present

## 2019-06-22 DIAGNOSIS — I509 Heart failure, unspecified: Secondary | ICD-10-CM | POA: Insufficient documentation

## 2019-06-22 DIAGNOSIS — C9000 Multiple myeloma not having achieved remission: Secondary | ICD-10-CM

## 2019-06-22 DIAGNOSIS — Z79899 Other long term (current) drug therapy: Secondary | ICD-10-CM | POA: Insufficient documentation

## 2019-06-22 DIAGNOSIS — K5730 Diverticulosis of large intestine without perforation or abscess without bleeding: Secondary | ICD-10-CM | POA: Diagnosis not present

## 2019-06-22 DIAGNOSIS — Z87891 Personal history of nicotine dependence: Secondary | ICD-10-CM | POA: Diagnosis not present

## 2019-06-22 LAB — COMPREHENSIVE METABOLIC PANEL
ALT: 17 U/L (ref 0–44)
AST: 30 U/L (ref 15–41)
Albumin: 3.3 g/dL — ABNORMAL LOW (ref 3.5–5.0)
Alkaline Phosphatase: 58 U/L (ref 38–126)
Anion gap: 8 (ref 5–15)
BUN: 25 mg/dL — ABNORMAL HIGH (ref 8–23)
CO2: 19 mmol/L — ABNORMAL LOW (ref 22–32)
Calcium: 9.5 mg/dL (ref 8.9–10.3)
Chloride: 109 mmol/L (ref 98–111)
Creatinine, Ser: 1.03 mg/dL (ref 0.61–1.24)
GFR calc Af Amer: >60 mL/min (ref >60)
GFR calc non Af Amer: >60 mL/min (ref >60)
Glucose, Bld: 175 mg/dL — ABNORMAL HIGH (ref 70–99)
Potassium: 5.1 mmol/L (ref 3.5–5.1)
Sodium: 136 mmol/L (ref 135–145)
Total Bilirubin: 0.9 mg/dL (ref 0.3–1.2)
Total Protein: 7.3 g/dL (ref 6.5–8.1)

## 2019-06-22 LAB — CBC WITH DIFFERENTIAL/PLATELET
Abs Immature Granulocytes: 0.01 10*3/uL (ref 0.00–0.07)
Basophils Absolute: 0.1 10*3/uL (ref 0.0–0.1)
Basophils Relative: 2 %
Eosinophils Absolute: 0.2 10*3/uL (ref 0.0–0.5)
Eosinophils Relative: 5 %
HCT: 30.6 % — ABNORMAL LOW (ref 39.0–52.0)
Hemoglobin: 10.5 g/dL — ABNORMAL LOW (ref 13.0–17.0)
Immature Granulocytes: 0 %
Lymphocytes Relative: 40 %
Lymphs Abs: 1.3 10*3/uL (ref 0.7–4.0)
MCH: 37.4 pg — ABNORMAL HIGH (ref 26.0–34.0)
MCHC: 34.3 g/dL (ref 30.0–36.0)
MCV: 108.9 fL — ABNORMAL HIGH (ref 80.0–100.0)
Monocytes Absolute: 0.3 10*3/uL (ref 0.1–1.0)
Monocytes Relative: 10 %
Neutro Abs: 1.4 10*3/uL — ABNORMAL LOW (ref 1.7–7.7)
Neutrophils Relative %: 43 %
Platelets: 141 10*3/uL — ABNORMAL LOW (ref 150–400)
RBC: 2.81 MIL/uL — ABNORMAL LOW (ref 4.22–5.81)
RDW: 13.8 % (ref 11.5–15.5)
WBC: 3.3 10*3/uL — ABNORMAL LOW (ref 4.0–10.5)
nRBC: 0 % (ref 0.0–0.2)

## 2019-06-22 MED ORDER — CYANOCOBALAMIN 1000 MCG/ML IJ SOLN
1000.0000 ug | Freq: Once | INTRAMUSCULAR | Status: AC
  Administered 2019-06-22: 1000 ug via INTRAMUSCULAR

## 2019-06-22 NOTE — Progress Notes (Signed)
Patient c/o swollen to bilateral feet

## 2019-06-25 LAB — PROTEIN ELECTROPHORESIS, SERUM
A/G Ratio: 0.9 (ref 0.7–1.7)
Albumin ELP: 3.3 g/dL (ref 2.9–4.4)
Alpha-1-Globulin: 0.2 g/dL (ref 0.0–0.4)
Alpha-2-Globulin: 0.8 g/dL (ref 0.4–1.0)
Beta Globulin: 1.4 g/dL — ABNORMAL HIGH (ref 0.7–1.3)
Gamma Globulin: 1.1 g/dL (ref 0.4–1.8)
Globulin, Total: 3.6 g/dL (ref 2.2–3.9)
Total Protein ELP: 6.9 g/dL (ref 6.0–8.5)

## 2019-07-05 ENCOUNTER — Inpatient Hospital Stay: Payer: Medicare Other

## 2019-07-05 NOTE — Progress Notes (Signed)
Nutrition Follow-up:  Patient with multiple myeloma and followed by Dr. Mike Gip.  Spoke with patient via phone for nutrition follow-up.  Patient reports that appetite is good.  Reports that he eats cereal or breakfast bar and boost for breakfast.  Likes a going out to breakfast but has not due to COVID-19.  Lunch typically is peanut butter nabs, maybe a burger.  Dinner is seafood or some kind of meat and vegetables.  Likes boost and drinks 1 a day.    Reports that his back pain is some better.     Medications: reviewed  Labs: glucose 175, BUN 25, creatinine WDL  Anthropometrics:   Weight has increased to 211 lb noted on 8/14 from 203 lb on 7/9.   Noted documentation of bilateral ankle edema.  Patient reports he still has some swelling but better.   Patient wants to weigh around 200 lb  NUTRITION DIAGNOSIS: Unintentional weight loss stable, maybe improved (??fluid)   INTERVENTION:  Reviewed good sources of protein with patient. Discussed oral nutrition supplements as well.  May not need boost shake if eating well and weight stable.  Could do boost original or boost glucose control/glucerna. Offered follow-up visit but patient planning on reaching out to RD if needed in the future.  Patient has contact information.      MONITORING, EVALUATION, GOAL: Patient will consume adequate calories and protein to maintain weight and lean muscle mass   NEXT VISIT: patient to reach out to RD as needed  Marguita Venning B. Zenia Resides, [place], [place] Registered Dietitian [phone] (pager)

## 2019-07-09 ENCOUNTER — Other Ambulatory Visit: Payer: Self-pay | Admitting: *Deleted

## 2019-07-09 DIAGNOSIS — C9000 Multiple myeloma not having achieved remission: Secondary | ICD-10-CM

## 2019-07-19 ENCOUNTER — Other Ambulatory Visit: Payer: Self-pay

## 2019-07-20 ENCOUNTER — Inpatient Hospital Stay: Payer: Medicare Other | Attending: Hematology and Oncology

## 2019-07-20 ENCOUNTER — Inpatient Hospital Stay: Payer: Medicare Other

## 2019-07-20 VITALS — BP 149/69 | HR 64 | Temp 96.6°F | Resp 18

## 2019-07-20 DIAGNOSIS — E538 Deficiency of other specified B group vitamins: Secondary | ICD-10-CM | POA: Diagnosis not present

## 2019-07-20 DIAGNOSIS — C9000 Multiple myeloma not having achieved remission: Secondary | ICD-10-CM | POA: Diagnosis not present

## 2019-07-20 DIAGNOSIS — Z79899 Other long term (current) drug therapy: Secondary | ICD-10-CM | POA: Insufficient documentation

## 2019-07-20 LAB — CBC WITH DIFFERENTIAL/PLATELET
Abs Immature Granulocytes: 0.01 10*3/uL (ref 0.00–0.07)
Basophils Absolute: 0 10*3/uL (ref 0.0–0.1)
Basophils Relative: 1 %
Eosinophils Absolute: 0.2 10*3/uL (ref 0.0–0.5)
Eosinophils Relative: 5 %
HCT: 31 % — ABNORMAL LOW (ref 39.0–52.0)
Hemoglobin: 10.6 g/dL — ABNORMAL LOW (ref 13.0–17.0)
Immature Granulocytes: 0 %
Lymphocytes Relative: 30 %
Lymphs Abs: 1.1 10*3/uL (ref 0.7–4.0)
MCH: 37.3 pg — ABNORMAL HIGH (ref 26.0–34.0)
MCHC: 34.2 g/dL (ref 30.0–36.0)
MCV: 109.2 fL — ABNORMAL HIGH (ref 80.0–100.0)
Monocytes Absolute: 0.4 10*3/uL (ref 0.1–1.0)
Monocytes Relative: 11 %
Neutro Abs: 1.8 10*3/uL (ref 1.7–7.7)
Neutrophils Relative %: 53 %
Platelets: 141 10*3/uL — ABNORMAL LOW (ref 150–400)
RBC: 2.84 MIL/uL — ABNORMAL LOW (ref 4.22–5.81)
RDW: 13.7 % (ref 11.5–15.5)
WBC: 3.5 10*3/uL — ABNORMAL LOW (ref 4.0–10.5)
nRBC: 0 % (ref 0.0–0.2)

## 2019-07-20 LAB — BASIC METABOLIC PANEL
Anion gap: 9 (ref 5–15)
BUN: 28 mg/dL — ABNORMAL HIGH (ref 8–23)
CO2: 18 mmol/L — ABNORMAL LOW (ref 22–32)
Calcium: 9.7 mg/dL (ref 8.9–10.3)
Chloride: 110 mmol/L (ref 98–111)
Creatinine, Ser: 1.08 mg/dL (ref 0.61–1.24)
GFR calc Af Amer: >60 mL/min (ref >60)
GFR calc non Af Amer: >60 mL/min (ref >60)
Glucose, Bld: 169 mg/dL — ABNORMAL HIGH (ref 70–99)
Potassium: 5.3 mmol/L — ABNORMAL HIGH (ref 3.5–5.1)
Sodium: 137 mmol/L (ref 135–145)

## 2019-07-20 MED ORDER — CYANOCOBALAMIN 1000 MCG/ML IJ SOLN
1000.0000 ug | Freq: Once | INTRAMUSCULAR | Status: AC
  Administered 2019-07-20: 14:00:00 1000 ug via INTRAMUSCULAR

## 2019-07-20 NOTE — Patient Instructions (Signed)

## 2019-07-23 ENCOUNTER — Telehealth: Payer: Self-pay

## 2019-07-23 DIAGNOSIS — E875 Hyperkalemia: Secondary | ICD-10-CM

## 2019-07-23 LAB — PROTEIN ELECTROPHORESIS, SERUM
A/G Ratio: 1 (ref 0.7–1.7)
Albumin ELP: 3.4 g/dL (ref 2.9–4.4)
Alpha-1-Globulin: 0.2 g/dL (ref 0.0–0.4)
Alpha-2-Globulin: 0.7 g/dL (ref 0.4–1.0)
Beta Globulin: 1.4 g/dL — ABNORMAL HIGH (ref 0.7–1.3)
Gamma Globulin: 1.1 g/dL (ref 0.4–1.8)
Globulin, Total: 3.5 g/dL (ref 2.2–3.9)
M-Spike, %: 0.3 g/dL — ABNORMAL HIGH
Total Protein ELP: 6.9 g/dL (ref 6.0–8.5)

## 2019-07-23 NOTE — Telephone Encounter (Signed)
-----   Message from Lequita Asal, MD sent at 07/23/2019  2:57 PM EDT ----- Regarding: Potassium was 5.3.  Needs repeat.  ----- Message ----- From: Interface, Lab In [place] Sent: 07/20/2019   1:32 PM EDT To: Lequita Asal, MD

## 2019-07-23 NOTE — Telephone Encounter (Signed)
Informed patient of elevated potassium level and, per Dr. Mike Gip, to have level rechecked tomorrow. Patient states he will come in at 1:45. Denies any further questions or concerns.

## 2019-07-24 ENCOUNTER — Inpatient Hospital Stay: Payer: Medicare Other

## 2019-07-24 ENCOUNTER — Other Ambulatory Visit
Admission: RE | Admit: 2019-07-24 | Discharge: 2019-07-24 | Disposition: A | Discharge status: Home / Self Care | Payer: Medicare Other | Source: Home / Self Care | Attending: Internal Medicine | Admitting: Internal Medicine

## 2019-07-24 ENCOUNTER — Other Ambulatory Visit: Payer: Self-pay | Admitting: Internal Medicine

## 2019-07-24 ENCOUNTER — Ambulatory Visit
Admission: RE | Admit: 2019-07-24 | Discharge: 2019-07-24 | Disposition: A | Discharge status: Home / Self Care | Payer: Medicare Other | Source: Ambulatory Visit | Attending: Internal Medicine | Admitting: Internal Medicine

## 2019-07-24 ENCOUNTER — Other Ambulatory Visit: Payer: Self-pay

## 2019-07-24 ENCOUNTER — Telehealth: Payer: Self-pay

## 2019-07-24 DIAGNOSIS — R600 Localized edema: Secondary | ICD-10-CM | POA: Diagnosis present

## 2019-07-24 DIAGNOSIS — C9000 Multiple myeloma not having achieved remission: Secondary | ICD-10-CM | POA: Diagnosis not present

## 2019-07-24 DIAGNOSIS — E875 Hyperkalemia: Secondary | ICD-10-CM

## 2019-07-24 LAB — COMPREHENSIVE METABOLIC PANEL
ALT: 20 U/L (ref 0–44)
AST: 34 U/L (ref 15–41)
Albumin: 3.7 g/dL (ref 3.5–5.0)
Alkaline Phosphatase: 66 U/L (ref 38–126)
Anion gap: 10 (ref 5–15)
BUN: 27 mg/dL — ABNORMAL HIGH (ref 8–23)
CO2: 19 mmol/L — ABNORMAL LOW (ref 22–32)
Calcium: 10.2 mg/dL (ref 8.9–10.3)
Chloride: 107 mmol/L (ref 98–111)
Creatinine, Ser: 1.18 mg/dL (ref 0.61–1.24)
GFR calc Af Amer: >60 mL/min (ref >60)
GFR calc non Af Amer: >60 mL/min (ref >60)
Glucose, Bld: 153 mg/dL — ABNORMAL HIGH (ref 70–99)
Potassium: 5.2 mmol/L — ABNORMAL HIGH (ref 3.5–5.1)
Sodium: 136 mmol/L (ref 135–145)
Total Bilirubin: 1 mg/dL (ref 0.3–1.2)
Total Protein: 7.8 g/dL (ref 6.5–8.1)

## 2019-07-24 LAB — POTASSIUM: Potassium: 5.2 mmol/L — ABNORMAL HIGH (ref 3.5–5.1)

## 2019-07-24 NOTE — Telephone Encounter (Signed)
Labs have been faxed over to PCP office today. I was able to leave a message for the Nurse to follow with the patient due to increase potassium levels. 5.2. I provided my number so she can call me back to inform me of the patient status.

## 2019-07-31 ENCOUNTER — Ambulatory Visit
Admission: RE | Admit: 2019-07-31 | Discharge: 2019-07-31 | Disposition: A | Discharge status: Home / Self Care | Payer: Medicare Other | Source: Ambulatory Visit | Attending: Hematology and Oncology | Admitting: Hematology and Oncology

## 2019-07-31 DIAGNOSIS — C9000 Multiple myeloma not having achieved remission: Secondary | ICD-10-CM | POA: Diagnosis present

## 2019-08-13 NOTE — Progress Notes (Signed)
The patient c/o bilateral feet swollen. The patient Name and DOB has been verified by phone today.

## 2019-08-14 ENCOUNTER — Other Ambulatory Visit: Payer: Self-pay

## 2019-08-14 DIAGNOSIS — C9000 Multiple myeloma not having achieved remission: Secondary | ICD-10-CM
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
## 2019-08-15 ENCOUNTER — Inpatient Hospital Stay: Payer: Medicare Other

## 2019-08-15 ENCOUNTER — Encounter: Payer: Self-pay | Admitting: Hematology and Oncology

## 2019-08-15 ENCOUNTER — Other Ambulatory Visit: Payer: Self-pay

## 2019-08-15 ENCOUNTER — Inpatient Hospital Stay (HOSPITAL_BASED_OUTPATIENT_CLINIC_OR_DEPARTMENT_OTHER): Payer: Medicare Other | Admitting: Hematology and Oncology

## 2019-08-15 ENCOUNTER — Inpatient Hospital Stay: Payer: Medicare Other | Attending: Hematology and Oncology

## 2019-08-15 VITALS — BP 154/72 | HR 82 | Temp 96.9°F | Resp 18 | Ht 67.0 in | Wt 208.7 lb

## 2019-08-15 DIAGNOSIS — D125 Benign neoplasm of sigmoid colon: Secondary | ICD-10-CM | POA: Diagnosis not present

## 2019-08-15 DIAGNOSIS — Z79899 Other long term (current) drug therapy: Secondary | ICD-10-CM | POA: Diagnosis not present

## 2019-08-15 DIAGNOSIS — E538 Deficiency of other specified B group vitamins: Secondary | ICD-10-CM | POA: Insufficient documentation

## 2019-08-15 DIAGNOSIS — R634 Abnormal weight loss: Secondary | ICD-10-CM | POA: Insufficient documentation

## 2019-08-15 DIAGNOSIS — G4730 Sleep apnea, unspecified: Secondary | ICD-10-CM | POA: Insufficient documentation

## 2019-08-15 DIAGNOSIS — I252 Old myocardial infarction: Secondary | ICD-10-CM | POA: Insufficient documentation

## 2019-08-15 DIAGNOSIS — I509 Heart failure, unspecified: Secondary | ICD-10-CM | POA: Insufficient documentation

## 2019-08-15 DIAGNOSIS — I10 Essential (primary) hypertension: Secondary | ICD-10-CM | POA: Diagnosis not present

## 2019-08-15 DIAGNOSIS — Z87891 Personal history of nicotine dependence: Secondary | ICD-10-CM | POA: Diagnosis not present

## 2019-08-15 DIAGNOSIS — I2510 Atherosclerotic heart disease of native coronary artery without angina pectoris: Secondary | ICD-10-CM | POA: Insufficient documentation

## 2019-08-15 DIAGNOSIS — Z7982 Long term (current) use of aspirin: Secondary | ICD-10-CM | POA: Insufficient documentation

## 2019-08-15 DIAGNOSIS — Z7984 Long term (current) use of oral hypoglycemic drugs: Secondary | ICD-10-CM | POA: Insufficient documentation

## 2019-08-15 DIAGNOSIS — N289 Disorder of kidney and ureter, unspecified: Secondary | ICD-10-CM | POA: Diagnosis not present

## 2019-08-15 DIAGNOSIS — F329 Major depressive disorder, single episode, unspecified: Secondary | ICD-10-CM | POA: Insufficient documentation

## 2019-08-15 DIAGNOSIS — M4800 Spinal stenosis, site unspecified: Secondary | ICD-10-CM | POA: Diagnosis not present

## 2019-08-15 DIAGNOSIS — F419 Anxiety disorder, unspecified: Secondary | ICD-10-CM | POA: Diagnosis not present

## 2019-08-15 DIAGNOSIS — D539 Nutritional anemia, unspecified: Secondary | ICD-10-CM

## 2019-08-15 DIAGNOSIS — K5730 Diverticulosis of large intestine without perforation or abscess without bleeding: Secondary | ICD-10-CM | POA: Diagnosis not present

## 2019-08-15 DIAGNOSIS — C9000 Multiple myeloma not having achieved remission: Secondary | ICD-10-CM | POA: Insufficient documentation

## 2019-08-15 DIAGNOSIS — E119 Type 2 diabetes mellitus without complications: Secondary | ICD-10-CM | POA: Diagnosis not present

## 2019-08-15 DIAGNOSIS — E785 Hyperlipidemia, unspecified: Secondary | ICD-10-CM | POA: Diagnosis not present

## 2019-08-15 DIAGNOSIS — K219 Gastro-esophageal reflux disease without esophagitis: Secondary | ICD-10-CM | POA: Insufficient documentation

## 2019-08-15 LAB — COMPREHENSIVE METABOLIC PANEL
ALT: 22 U/L (ref 0–44)
AST: 32 U/L (ref 15–41)
Albumin: 3.6 g/dL (ref 3.5–5.0)
Alkaline Phosphatase: 76 U/L (ref 38–126)
Anion gap: 11 (ref 5–15)
BUN: 36 mg/dL — ABNORMAL HIGH (ref 8–23)
CO2: 14 mmol/L — ABNORMAL LOW (ref 22–32)
Calcium: 9.7 mg/dL (ref 8.9–10.3)
Chloride: 107 mmol/L (ref 98–111)
Creatinine, Ser: 1.2 mg/dL (ref 0.61–1.24)
GFR calc Af Amer: >60 mL/min (ref >60)
GFR calc non Af Amer: 59 mL/min — ABNORMAL LOW (ref >60)
Glucose, Bld: 343 mg/dL — ABNORMAL HIGH (ref 70–99)
Potassium: 5.1 mmol/L (ref 3.5–5.1)
Sodium: 132 mmol/L — ABNORMAL LOW (ref 135–145)
Total Bilirubin: 0.8 mg/dL (ref 0.3–1.2)
Total Protein: 8.1 g/dL (ref 6.5–8.1)

## 2019-08-15 LAB — CBC WITH DIFFERENTIAL/PLATELET
Abs Immature Granulocytes: 0.04 10*3/uL (ref 0.00–0.07)
Basophils Absolute: 0 10*3/uL (ref 0.0–0.1)
Basophils Relative: 0 %
Eosinophils Absolute: 0 10*3/uL (ref 0.0–0.5)
Eosinophils Relative: 0 %
HCT: 31.8 % — ABNORMAL LOW (ref 39.0–52.0)
Hemoglobin: 10.8 g/dL — ABNORMAL LOW (ref 13.0–17.0)
Immature Granulocytes: 1 %
Lymphocytes Relative: 10 %
Lymphs Abs: 0.7 10*3/uL (ref 0.7–4.0)
MCH: 36.9 pg — ABNORMAL HIGH (ref 26.0–34.0)
MCHC: 34 g/dL (ref 30.0–36.0)
MCV: 108.5 fL — ABNORMAL HIGH (ref 80.0–100.0)
Monocytes Absolute: 0.5 10*3/uL (ref 0.1–1.0)
Monocytes Relative: 8 %
Neutro Abs: 5.8 10*3/uL (ref 1.7–7.7)
Neutrophils Relative %: 81 %
Platelets: 129 10*3/uL — ABNORMAL LOW (ref 150–400)
RBC: 2.93 MIL/uL — ABNORMAL LOW (ref 4.22–5.81)
RDW: 12.9 % (ref 11.5–15.5)
WBC: 7 10*3/uL (ref 4.0–10.5)
nRBC: 0.3 % — ABNORMAL HIGH (ref 0.0–0.2)

## 2019-08-15 MED ORDER — CYANOCOBALAMIN 1000 MCG/ML IJ SOLN
1000.0000 ug | Freq: Once | INTRAMUSCULAR | Status: AC
  Administered 2019-08-15: 1000 ug via INTRAMUSCULAR

## 2019-08-16 ENCOUNTER — Ambulatory Visit: Payer: PRIVATE HEALTH INSURANCE | Admitting: Hematology and Oncology

## 2019-08-16 ENCOUNTER — Other Ambulatory Visit: Payer: PRIVATE HEALTH INSURANCE

## 2019-08-16 ENCOUNTER — Ambulatory Visit: Payer: PRIVATE HEALTH INSURANCE

## 2019-08-16 LAB — PROTEIN ELECTROPHORESIS, SERUM
A/G Ratio: 0.9 (ref 0.7–1.7)
Albumin ELP: 3.5 g/dL (ref 2.9–4.4)
Alpha-1-Globulin: 0.2 g/dL (ref 0.0–0.4)
Alpha-2-Globulin: 0.8 g/dL (ref 0.4–1.0)
Beta Globulin: 1.4 g/dL — ABNORMAL HIGH (ref 0.7–1.3)
Gamma Globulin: 1.5 g/dL (ref 0.4–1.8)
Globulin, Total: 3.9 g/dL (ref 2.2–3.9)
M-Spike, %: 0.5 g/dL — ABNORMAL HIGH
Total Protein ELP: 7.4 g/dL (ref 6.0–8.5)

## 2019-09-05 ENCOUNTER — Telehealth: Payer: Self-pay

## 2019-09-05 NOTE — Telephone Encounter (Signed)
-----   Message from Secundino Ginger sent at 09/05/2019 11:21 AM EDT ----- Regarding: Illene Regulus wants to know if you got the paperwork straightened out on his Revlamid. He is taking his last one today

## 2019-09-05 NOTE — Telephone Encounter (Signed)
Spoke with the patient to inform him that he need to do his patient survey before 09/14/2019 or his paperwork will expire. The patient is understanding and agreeable.

## 2019-09-07 ENCOUNTER — Other Ambulatory Visit: Payer: Self-pay

## 2019-09-07 ENCOUNTER — Telehealth: Payer: Self-pay

## 2019-09-07 DIAGNOSIS — C9000 Multiple myeloma not having achieved remission: Secondary | ICD-10-CM

## 2019-09-07 MED ORDER — LENALIDOMIDE 2.5 MG PO CAPS: ORAL_CAPSULE | ORAL | 0 refills | Status: DC

## 2019-09-07 NOTE — Telephone Encounter (Signed)
-----   Message from Erasmo Score, [place] sent at 09/06/2019 11:49 AM EDT ----- Regarding: His medication Patient called in today in regards to Revlamid prescription, patient stated he spoke with Loma Sousa yesterday on 10/28 and he was told to call pharmacy and he did and pharmacy states a new prescription is needed and they will not fill until new script is given, please advise... Pt requested a call back.

## 2019-09-07 NOTE — Telephone Encounter (Signed)
Contacted patient and informed him RX has been sent into pharmacy for Revlimid. Advised patient pharmacy should reach out to him to complete patient survey and should he not hear from them by Wednesday, then to contact them to complete survey. Patient verbalizes understanding and denies any further questions or concerns.

## 2019-09-12 ENCOUNTER — Other Ambulatory Visit: Payer: Self-pay | Admitting: Oncology

## 2019-09-12 ENCOUNTER — Inpatient Hospital Stay: Payer: Medicare Other

## 2019-09-12 ENCOUNTER — Ambulatory Visit
Admission: RE | Admit: 2019-09-12 | Discharge: 2019-09-12 | Disposition: A | Discharge status: Home / Self Care | Payer: Medicare Other | Source: Ambulatory Visit | Attending: Oncology | Admitting: Oncology

## 2019-09-12 ENCOUNTER — Ambulatory Visit
Admission: RE | Admit: 2019-09-12 | Discharge: 2019-09-12 | Disposition: A | Discharge status: Home / Self Care | Payer: Medicare Other | Attending: Oncology | Admitting: Oncology

## 2019-09-12 ENCOUNTER — Other Ambulatory Visit: Payer: Self-pay

## 2019-09-12 ENCOUNTER — Inpatient Hospital Stay (HOSPITAL_BASED_OUTPATIENT_CLINIC_OR_DEPARTMENT_OTHER): Payer: Medicare Other | Admitting: Oncology

## 2019-09-12 ENCOUNTER — Inpatient Hospital Stay: Payer: Medicare Other | Attending: Hematology and Oncology

## 2019-09-12 ENCOUNTER — Encounter: Payer: Self-pay | Admitting: Oncology

## 2019-09-12 VITALS — BP 129/52 | HR 50 | Temp 96.7°F | Resp 18 | Ht 67.0 in | Wt 211.6 lb

## 2019-09-12 DIAGNOSIS — F418 Other specified anxiety disorders: Secondary | ICD-10-CM | POA: Insufficient documentation

## 2019-09-12 DIAGNOSIS — I2510 Atherosclerotic heart disease of native coronary artery without angina pectoris: Secondary | ICD-10-CM | POA: Insufficient documentation

## 2019-09-12 DIAGNOSIS — I509 Heart failure, unspecified: Secondary | ICD-10-CM | POA: Insufficient documentation

## 2019-09-12 DIAGNOSIS — E538 Deficiency of other specified B group vitamins: Secondary | ICD-10-CM | POA: Insufficient documentation

## 2019-09-12 DIAGNOSIS — F17210 Nicotine dependence, cigarettes, uncomplicated: Secondary | ICD-10-CM | POA: Insufficient documentation

## 2019-09-12 DIAGNOSIS — R05 Cough: Secondary | ICD-10-CM | POA: Insufficient documentation

## 2019-09-12 DIAGNOSIS — R531 Weakness: Secondary | ICD-10-CM | POA: Diagnosis not present

## 2019-09-12 DIAGNOSIS — R944 Abnormal results of kidney function studies: Secondary | ICD-10-CM | POA: Diagnosis not present

## 2019-09-12 DIAGNOSIS — D709 Neutropenia, unspecified: Secondary | ICD-10-CM | POA: Insufficient documentation

## 2019-09-12 DIAGNOSIS — Z79899 Other long term (current) drug therapy: Secondary | ICD-10-CM | POA: Insufficient documentation

## 2019-09-12 DIAGNOSIS — Z7982 Long term (current) use of aspirin: Secondary | ICD-10-CM | POA: Diagnosis not present

## 2019-09-12 DIAGNOSIS — M129 Arthropathy, unspecified: Secondary | ICD-10-CM | POA: Diagnosis not present

## 2019-09-12 DIAGNOSIS — G4700 Insomnia, unspecified: Secondary | ICD-10-CM | POA: Diagnosis not present

## 2019-09-12 DIAGNOSIS — I10 Essential (primary) hypertension: Secondary | ICD-10-CM | POA: Diagnosis not present

## 2019-09-12 DIAGNOSIS — E119 Type 2 diabetes mellitus without complications: Secondary | ICD-10-CM | POA: Insufficient documentation

## 2019-09-12 DIAGNOSIS — I252 Old myocardial infarction: Secondary | ICD-10-CM | POA: Insufficient documentation

## 2019-09-12 DIAGNOSIS — G4730 Sleep apnea, unspecified: Secondary | ICD-10-CM | POA: Insufficient documentation

## 2019-09-12 DIAGNOSIS — E785 Hyperlipidemia, unspecified: Secondary | ICD-10-CM | POA: Insufficient documentation

## 2019-09-12 DIAGNOSIS — Z7984 Long term (current) use of oral hypoglycemic drugs: Secondary | ICD-10-CM | POA: Insufficient documentation

## 2019-09-12 DIAGNOSIS — C9000 Multiple myeloma not having achieved remission: Secondary | ICD-10-CM | POA: Diagnosis present

## 2019-09-12 DIAGNOSIS — R5383 Other fatigue: Secondary | ICD-10-CM | POA: Diagnosis not present

## 2019-09-12 DIAGNOSIS — M549 Dorsalgia, unspecified: Secondary | ICD-10-CM | POA: Insufficient documentation

## 2019-09-12 DIAGNOSIS — R634 Abnormal weight loss: Secondary | ICD-10-CM | POA: Diagnosis not present

## 2019-09-12 DIAGNOSIS — R059 Cough, unspecified: Secondary | ICD-10-CM

## 2019-09-12 DIAGNOSIS — K219 Gastro-esophageal reflux disease without esophagitis: Secondary | ICD-10-CM | POA: Insufficient documentation

## 2019-09-12 LAB — COMPREHENSIVE METABOLIC PANEL
ALT: 13 U/L (ref 0–44)
AST: 23 U/L (ref 15–41)
Albumin: 3.2 g/dL — ABNORMAL LOW (ref 3.5–5.0)
Alkaline Phosphatase: 62 U/L (ref 38–126)
Anion gap: 9 (ref 5–15)
BUN: 24 mg/dL — ABNORMAL HIGH (ref 8–23)
CO2: 19 mmol/L — ABNORMAL LOW (ref 22–32)
Calcium: 9 mg/dL (ref 8.9–10.3)
Chloride: 110 mmol/L (ref 98–111)
Creatinine, Ser: 0.96 mg/dL (ref 0.61–1.24)
GFR calc Af Amer: >60 mL/min (ref >60)
GFR calc non Af Amer: >60 mL/min (ref >60)
Glucose, Bld: 145 mg/dL — ABNORMAL HIGH (ref 70–99)
Potassium: 4.4 mmol/L (ref 3.5–5.1)
Sodium: 138 mmol/L (ref 135–145)
Total Bilirubin: 0.6 mg/dL (ref 0.3–1.2)
Total Protein: 7.7 g/dL (ref 6.5–8.1)

## 2019-09-12 LAB — CBC WITH DIFFERENTIAL/PLATELET
Abs Immature Granulocytes: 0.01 10*3/uL (ref 0.00–0.07)
Basophils Absolute: 0.1 10*3/uL (ref 0.0–0.1)
Basophils Relative: 3 %
Eosinophils Absolute: 0.2 10*3/uL (ref 0.0–0.5)
Eosinophils Relative: 8 %
HCT: 32.3 % — ABNORMAL LOW (ref 39.0–52.0)
Hemoglobin: 10.8 g/dL — ABNORMAL LOW (ref 13.0–17.0)
Immature Granulocytes: 0 %
Lymphocytes Relative: 31 %
Lymphs Abs: 0.9 10*3/uL (ref 0.7–4.0)
MCH: 36.1 pg — ABNORMAL HIGH (ref 26.0–34.0)
MCHC: 33.4 g/dL (ref 30.0–36.0)
MCV: 108 fL — ABNORMAL HIGH (ref 80.0–100.0)
Monocytes Absolute: 0.6 10*3/uL (ref 0.1–1.0)
Monocytes Relative: 20 %
Neutro Abs: 1.1 10*3/uL — ABNORMAL LOW (ref 1.7–7.7)
Neutrophils Relative %: 38 %
Platelets: 148 10*3/uL — ABNORMAL LOW (ref 150–400)
RBC: 2.99 MIL/uL — ABNORMAL LOW (ref 4.22–5.81)
RDW: 12.8 % (ref 11.5–15.5)
WBC: 2.9 10*3/uL — ABNORMAL LOW (ref 4.0–10.5)
nRBC: 0 % (ref 0.0–0.2)

## 2019-09-12 LAB — FERRITIN: Ferritin: 149 ng/mL (ref 24–336)

## 2019-09-12 MED ORDER — CYANOCOBALAMIN 1000 MCG/ML IJ SOLN
1000.0000 ug | Freq: Once | INTRAMUSCULAR | Status: AC
  Administered 2019-09-12: 15:00:00 1000 ug via INTRAMUSCULAR

## 2019-09-12 MED ORDER — HYDROCOD POLST-CPM POLST ER 10-8 MG/5ML PO SUER: 5.0000 mL | Freq: Every evening | ORAL | 0 refills | Status: DC | PRN

## 2019-09-12 NOTE — Patient Instructions (Signed)

## 2019-09-12 NOTE — Progress Notes (Signed)
No new changes noted today 
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
## 2019-09-12 NOTE — Progress Notes (Signed)
Chest x-ray was negative for active cardiopulmonary disease.   Rx Tussionex at bedtime to help with sleep and cough.  Attempted to contact patient but unsuccessful.  Left a voicemail for return phone call.  Faythe Casa, NP 09/12/2019 4:13 PM

## 2019-09-13 ENCOUNTER — Telehealth: Payer: Self-pay

## 2019-09-13 ENCOUNTER — Inpatient Hospital Stay: Payer: Medicare Other

## 2019-09-13 LAB — PROTEIN ELECTROPHORESIS, SERUM
A/G Ratio: 0.8 (ref 0.7–1.7)
Albumin ELP: 3.1 g/dL (ref 2.9–4.4)
Alpha-1-Globulin: 0.3 g/dL (ref 0.0–0.4)
Alpha-2-Globulin: 0.7 g/dL (ref 0.4–1.0)
Beta Globulin: 1.3 g/dL (ref 0.7–1.3)
Gamma Globulin: 1.5 g/dL (ref 0.4–1.8)
Globulin, Total: 3.8 g/dL (ref 2.2–3.9)
Total Protein ELP: 6.9 g/dL (ref 6.0–8.5)

## 2019-09-13 LAB — KAPPA/LAMBDA LIGHT CHAINS
Kappa free light chain: 111 mg/L — ABNORMAL HIGH (ref 3.3–19.4)
Kappa, lambda light chain ratio: 1.59 (ref 0.26–1.65)
Lambda free light chains: 69.8 mg/L — ABNORMAL HIGH (ref 5.7–26.3)

## 2019-09-13 NOTE — Telephone Encounter (Signed)
The patient has been approved for Revlimid 2.5 mg through 11/07/2020. The patient has been informed.

## 2019-09-14 ENCOUNTER — Telehealth: Payer: Self-pay | Admitting: Pharmacy Technician

## 2019-09-14 NOTE — Telephone Encounter (Signed)
Oral Oncology Patient Advocate Encounter  Was successful in securing patient a $11,000 grant from [facility] to provide copayment coverage for Revlimid.  This will keep the out of pocket expense at $0.     [employer] ID: [id number]  I have spoken with the patient.   The billing information is as follows and has been shared with [facility].    RxBin: [id number] PCN: [id number] Member ID: [id number] Group ID: [id number] Dates of Eligibility: 08/15/2019 through 08/13/2020  Luis [name] Patient [place] Phone [phone] Fax [phone] 09/14/2019 1:26 PM

## 2019-09-17 ENCOUNTER — Telehealth: Payer: Self-pay | Admitting: Pharmacist
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
## 2019-09-17 NOTE — Telephone Encounter (Signed)
Oral Chemotherapy Pharmacist Encounter   Received a call from Mr. Sayles stating that [facility] told him his REMs Josem Kaufmann # was expired and he needed a new #. It looks likethe number they had just expired on 09/14/2019. Contacted Courtney on Dr. Kem Parkinson team who was able to obtain a new auth number. I called [facility] to letthem know the new number. I called Mr. Chrestman to give him an update. Instructed him to call [facility] back in ~2 hours. He know to give me a call back if there are any furthee problems  REMs auth #: [id number], obtained 09/17/2019  Darl Pikes, PharmD, BCPS, [facility]/Oncology Clinical Pharmacist [facility] [phone]  09/17/2019 2:47 PM

## 2019-09-19 ENCOUNTER — Encounter: Payer: Self-pay | Admitting: Hematology and Oncology

## 2019-09-19 ENCOUNTER — Other Ambulatory Visit: Payer: Self-pay

## 2019-09-19 ENCOUNTER — Inpatient Hospital Stay (HOSPITAL_BASED_OUTPATIENT_CLINIC_OR_DEPARTMENT_OTHER): Payer: Medicare Other | Admitting: Hematology and Oncology

## 2019-09-19 ENCOUNTER — Inpatient Hospital Stay: Payer: Medicare Other

## 2019-09-19 VITALS — BP 118/83 | HR 95 | Temp 98.5°F | Resp 18 | Ht 67.0 in | Wt 232.1 lb

## 2019-09-19 DIAGNOSIS — S22000A Wedge compression fracture of unspecified thoracic vertebra, initial encounter for closed fracture: Secondary | ICD-10-CM | POA: Diagnosis not present

## 2019-09-19 DIAGNOSIS — C9000 Multiple myeloma not having achieved remission: Secondary | ICD-10-CM

## 2019-09-19 DIAGNOSIS — D539 Nutritional anemia, unspecified: Secondary | ICD-10-CM | POA: Diagnosis not present

## 2019-09-19 DIAGNOSIS — E538 Deficiency of other specified B group vitamins: Secondary | ICD-10-CM

## 2019-09-19 LAB — CBC WITH DIFFERENTIAL/PLATELET
Abs Immature Granulocytes: 0 10*3/uL (ref 0.00–0.07)
Basophils Absolute: 0.1 10*3/uL (ref 0.0–0.1)
Basophils Relative: 3 %
Eosinophils Absolute: 0.2 10*3/uL (ref 0.0–0.5)
Eosinophils Relative: 5 %
HCT: 32 % — ABNORMAL LOW (ref 39.0–52.0)
Hemoglobin: 11.1 g/dL — ABNORMAL LOW (ref 13.0–17.0)
Immature Granulocytes: 0 %
Lymphocytes Relative: 29 %
Lymphs Abs: 1.1 10*3/uL (ref 0.7–4.0)
MCH: 36.4 pg — ABNORMAL HIGH (ref 26.0–34.0)
MCHC: 34.7 g/dL (ref 30.0–36.0)
MCV: 104.9 fL — ABNORMAL HIGH (ref 80.0–100.0)
Monocytes Absolute: 0.6 10*3/uL (ref 0.1–1.0)
Monocytes Relative: 17 %
Neutro Abs: 1.6 10*3/uL — ABNORMAL LOW (ref 1.7–7.7)
Neutrophils Relative %: 46 %
Platelets: 152 10*3/uL (ref 150–400)
RBC: 3.05 MIL/uL — ABNORMAL LOW (ref 4.22–5.81)
RDW: 12.8 % (ref 11.5–15.5)
WBC: 3.6 10*3/uL — ABNORMAL LOW (ref 4.0–10.5)
nRBC: 0 % (ref 0.0–0.2)

## 2019-09-19 LAB — COMPREHENSIVE METABOLIC PANEL
ALT: 16 U/L (ref 0–44)
AST: 32 U/L (ref 15–41)
Albumin: 3.3 g/dL — ABNORMAL LOW (ref 3.5–5.0)
Alkaline Phosphatase: 60 U/L (ref 38–126)
Anion gap: 10 (ref 5–15)
BUN: 30 mg/dL — ABNORMAL HIGH (ref 8–23)
CO2: 19 mmol/L — ABNORMAL LOW (ref 22–32)
Calcium: 9.4 mg/dL (ref 8.9–10.3)
Chloride: 105 mmol/L (ref 98–111)
Creatinine, Ser: 1.29 mg/dL — ABNORMAL HIGH (ref 0.61–1.24)
GFR calc Af Amer: >60 mL/min (ref >60)
GFR calc non Af Amer: 54 mL/min — ABNORMAL LOW (ref >60)
Glucose, Bld: 188 mg/dL — ABNORMAL HIGH (ref 70–99)
Potassium: 4.6 mmol/L (ref 3.5–5.1)
Sodium: 134 mmol/L — ABNORMAL LOW (ref 135–145)
Total Bilirubin: 0.8 mg/dL (ref 0.3–1.2)
Total Protein: 7.6 g/dL (ref 6.5–8.1)

## 2019-09-19 NOTE — Progress Notes (Signed)
The patient is wheezing while in office today. Coughing is a little better today.

## 2019-10-08 ENCOUNTER — Other Ambulatory Visit: Payer: Self-pay | Admitting: Hematology and Oncology

## 2019-10-08 DIAGNOSIS — C9000 Multiple myeloma not having achieved remission: Secondary | ICD-10-CM

## 2019-10-09 ENCOUNTER — Telehealth: Payer: Self-pay

## 2019-10-09 NOTE — Telephone Encounter (Signed)
spoke with the patient due to a message received . The patient states he missed 2 doses of his medication and wanted to know should he double up. I have informed the patient if he has missed a dose do not double up on the doses continue to take 1 tab po daily. The patient he will start his 7 days off tomorrow. The patient was understanding and agreeable.

## 2019-10-10 ENCOUNTER — Other Ambulatory Visit: Payer: Self-pay

## 2019-10-10 ENCOUNTER — Inpatient Hospital Stay: Payer: Medicare Other | Attending: Hematology and Oncology

## 2019-10-10 ENCOUNTER — Inpatient Hospital Stay: Payer: Medicare Other

## 2019-10-10 VITALS — BP 123/81 | HR 76 | Temp 98.5°F | Resp 18

## 2019-10-10 DIAGNOSIS — Z23 Encounter for immunization: Secondary | ICD-10-CM | POA: Insufficient documentation

## 2019-10-10 DIAGNOSIS — C9000 Multiple myeloma not having achieved remission: Secondary | ICD-10-CM | POA: Diagnosis not present

## 2019-10-10 DIAGNOSIS — E538 Deficiency of other specified B group vitamins: Secondary | ICD-10-CM | POA: Diagnosis not present

## 2019-10-10 DIAGNOSIS — Z79899 Other long term (current) drug therapy: Secondary | ICD-10-CM | POA: Insufficient documentation

## 2019-10-10 DIAGNOSIS — D539 Nutritional anemia, unspecified: Secondary | ICD-10-CM

## 2019-10-10 LAB — CBC WITH DIFFERENTIAL/PLATELET
Abs Immature Granulocytes: 0.03 10*3/uL (ref 0.00–0.07)
Basophils Absolute: 0.1 10*3/uL (ref 0.0–0.1)
Basophils Relative: 1 %
Eosinophils Absolute: 0.5 10*3/uL (ref 0.0–0.5)
Eosinophils Relative: 9 %
HCT: 33.2 % — ABNORMAL LOW (ref 39.0–52.0)
Hemoglobin: 11.3 g/dL — ABNORMAL LOW (ref 13.0–17.0)
Immature Granulocytes: 1 %
Lymphocytes Relative: 16 %
Lymphs Abs: 0.9 10*3/uL (ref 0.7–4.0)
MCH: 35.2 pg — ABNORMAL HIGH (ref 26.0–34.0)
MCHC: 34 g/dL (ref 30.0–36.0)
MCV: 103.4 fL — ABNORMAL HIGH (ref 80.0–100.0)
Monocytes Absolute: 0.9 10*3/uL (ref 0.1–1.0)
Monocytes Relative: 16 %
Neutro Abs: 3.5 10*3/uL (ref 1.7–7.7)
Neutrophils Relative %: 57 %
Platelets: 155 10*3/uL (ref 150–400)
RBC: 3.21 MIL/uL — ABNORMAL LOW (ref 4.22–5.81)
RDW: 13.1 % (ref 11.5–15.5)
WBC: 5.9 10*3/uL (ref 4.0–10.5)
nRBC: 0 % (ref 0.0–0.2)

## 2019-10-10 MED ORDER — INFLUENZA VAC A&B SA ADJ QUAD 0.5 ML IM PRSY
0.5000 mL | PREFILLED_SYRINGE | Freq: Once | INTRAMUSCULAR | Status: AC
  Administered 2019-10-10: 0.5 mL via INTRAMUSCULAR

## 2019-10-10 MED ORDER — CYANOCOBALAMIN 1000 MCG/ML IJ SOLN
1000.0000 ug | Freq: Once | INTRAMUSCULAR | Status: AC
  Administered 2019-10-10: 1000 ug via INTRAMUSCULAR

## 2019-10-10 NOTE — Patient Instructions (Signed)
Cyanocobalamin, Vitamin B12 injection What is this medicine? CYANOCOBALAMIN (sye an oh koe BAL a min) is a man made form of vitamin B12. Vitamin B12 is used in the growth of healthy blood cells, nerve cells, and proteins in the body. It also helps with the metabolism of fats and carbohydrates. This medicine is used to treat people who can not absorb vitamin B12. This medicine may be used for other purposes; ask your health care provider or pharmacist if you have questions. COMMON BRAND NAME(S): B-12 Compliance Kit, B-12 Injection Kit, Cyomin, LA-12, Nutri-Twelve, Physicians EZ Use B-12, Primabalt What should I tell my health care provider before I take this medicine? They need to know if you have any of these conditions:  kidney disease  Leber's disease  megaloblastic anemia  an unusual or allergic reaction to cyanocobalamin, cobalt, other medicines, foods, dyes, or preservatives  pregnant or trying to get pregnant  breast-feeding How should I use this medicine? This medicine is injected into a muscle or deeply under the skin. It is usually given by a health care professional in a clinic or doctor's office. However, your doctor may teach you how to inject yourself. Follow all instructions. Talk to your pediatrician regarding the use of this medicine in children. Special care may be needed. Overdosage: If you think you have taken too much of this medicine contact a poison control center or emergency room at once. NOTE: This medicine is only for you. Do not share this medicine with others. What if I miss a dose? If you are given your dose at a clinic or doctor's office, call to reschedule your appointment. If you give your own injections and you miss a dose, take it as soon as you can. If it is almost time for your next dose, take only that dose. Do not take double or extra doses. What may interact with this medicine?  colchicine  heavy alcohol intake This list may not describe all  possible interactions. Give your health care provider a list of all the medicines, herbs, non-prescription drugs, or dietary supplements you use. Also tell them if you smoke, drink alcohol, or use illegal drugs. Some items may interact with your medicine. What should I watch for while using this medicine? Visit your doctor or health care professional regularly. You may need blood work done while you are taking this medicine. You may need to follow a special diet. Talk to your doctor. Limit your alcohol intake and avoid smoking to get the best benefit. What side effects may I notice from receiving this medicine? Side effects that you should report to your doctor or health care professional as soon as possible:  allergic reactions like skin rash, itching or hives, swelling of the face, lips, or tongue  blue tint to skin  chest tightness, pain  difficulty breathing, wheezing  dizziness  red, swollen painful area on the leg Side effects that usually do not require medical attention (report to your doctor or health care professional if they continue or are bothersome):  diarrhea  headache This list may not describe all possible side effects. Call your doctor for medical advice about side effects. You may report side effects to FDA at 1-800-FDA-1088. Where should I keep my medicine? Keep out of the reach of children. Store at room temperature between 15 and 30 degrees C (59 and 85 degrees F). Protect from light. Throw away any unused medicine after the expiration date. NOTE: This sheet is a summary. It may not cover  all possible information. If you have questions about this medicine, talk to your doctor, pharmacist, or health care provider.  2020 Elsevier/Gold Standard (2008-02-05 22:10:20) Preventing Influenza, Adult Influenza, more commonly known as "the flu," is a viral infection that mainly affects the respiratory tract. The respiratory tract includes structures that help you breathe,  such as the lungs, nose, and throat. The flu causes many common cold symptoms, as well as a high fever and body aches. The flu spreads easily from person to person (is contagious). The flu is most common from December through March. This is called flu season.You can catch the flu virus by:  Breathing in droplets from an infected person's cough or sneeze.  Touching something that was recently contaminated with the virus and then touching your mouth, nose, or eyes. What can I do to lower my risk?        You can decrease your risk of getting the flu by:  Getting a flu shot (influenza vaccination) every year. This is the best way to prevent the flu. A flu shot is recommended for everyone age 48 months and older. ? It is best to get a flu shot in the fall, as soon as it is available. Getting a flu shot during winter or spring instead is still a good idea. Flu season can last into early spring. ? Preventing the flu through vaccination requires getting a new flu shot every year. This is because the flu virus changes slightly (mutates) from one year to the next. Even if a flu shot does not completely protect you from all flu virus mutations, it can reduce the severity of your illness and prevent dangerous complications of the flu. ? If you are pregnant, you can and should get a flu shot. ? If you have had a reaction to the shot in the past or if you are allergic to eggs, check with your health care provider before getting a flu shot. ? Sometimes the vaccine is available as a nasal spray. In some years, the nasal spray has not been as effective against the flu virus. Check with your health care provider if you have questions about this.  Practicing good health habits. This is especially important during flu season. ? Avoid contact with people who are sick with flu or cold symptoms. ? Wash your hands with soap and water often. If soap and water are not available, use alcohol-based hand sanitizer. ?  Avoid touching your hands to your face, especially when you have not washed your hands recently. ? Use a disinfectant to clean surfaces at home and at work that may be contaminated with the flu virus. ? Keep your body's disease-fighting system (immune system) in good shape by eating a healthy diet, drinking plenty of fluids, getting enough sleep, and exercising regularly. If you do get the flu, avoid spreading it to others by:  Staying home until your symptoms have been gone for at least one day.  Covering your mouth and nose when you cough or sneeze.  Avoiding close contact with others, especially babies and elderly people. Why are these changes important? Getting a flu shot and practicing good health habits protects you as well as other people. If you get the flu, your friends, family, and co-workers are also at risk of getting it, because it spreads so easily to others. Each year, about 2 out of every 10 people get the flu. Having the flu can lead to complications, such as pneumonia, ear infection, and sinus infection.  The flu also can be deadly, especially for babies, people older than age 3, and people who have serious long-term diseases. How is this treated? Most people recover from the flu by resting at home and drinking plenty of fluids. However, a prescription antiviral medicine may reduce your flu symptoms and may make your flu go away sooner. This medicine must be started within a few days of getting flu symptoms. You can talk with your health care provider about whether you need an antiviral medicine. Antiviral medicine may be prescribed for people who are at risk for more serious flu symptoms. This includes people who:  Are older than age 77.  Are pregnant.  Have a condition that makes the flu worse or more dangerous. Where to find more information  Centers for Disease Control and Prevention: http://www.smith-bell.org/  [employer].ee: azureicus.com  [employer]  [employer]: familydoctor.org/familydoctor/en/kids/vaccines/preventing-the-flu.html Contact a health care provider if:  You have influenza and you develop new symptoms.  You have: ? Chest pain. ? Diarrhea. ? A fever.  Your cough gets worse, or you produce more mucus. Summary  The best way to prevent the flu is to get a flu shot every year in the fall.  Even if you get the flu after you have received the yearly vaccine, your flu may be milder and go away sooner because of your flu shot.  If you get the flu, antiviral medicines that are started with a few days of symptoms may reduce your flu symptoms and may make your flu go away sooner.  You can also help prevent the flu by practicing good health habits. This information is not intended to replace advice given to you by your health care provider. Make sure you discuss any questions you have with your health care provider. Document Released: 11/09/2015 Document Revised: 10/07/2017 Document Reviewed: 07/03/2016 Elsevier Patient Education  2020 [name].

## 2019-10-11 ENCOUNTER — Inpatient Hospital Stay: Payer: Medicare Other

## 2019-10-26 NOTE — Progress Notes (Signed)
Confirmed Name and DOB. Patient reports he has had no appetite since the passing of his wife. Denies any concerns.
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
## 2019-10-29 ENCOUNTER — Other Ambulatory Visit: Payer: Self-pay

## 2019-10-29 ENCOUNTER — Inpatient Hospital Stay (HOSPITAL_BASED_OUTPATIENT_CLINIC_OR_DEPARTMENT_OTHER): Payer: Medicare Other | Admitting: Hematology and Oncology

## 2019-10-29 ENCOUNTER — Inpatient Hospital Stay: Payer: Medicare Other

## 2019-10-29 ENCOUNTER — Encounter: Payer: Self-pay | Admitting: Hematology and Oncology

## 2019-10-29 VITALS — BP 128/52 | HR 94 | Temp 97.2°F | Resp 18 | Ht 67.0 in | Wt 192.1 lb

## 2019-10-29 DIAGNOSIS — N289 Disorder of kidney and ureter, unspecified: Secondary | ICD-10-CM

## 2019-10-29 DIAGNOSIS — D539 Nutritional anemia, unspecified: Secondary | ICD-10-CM | POA: Diagnosis not present

## 2019-10-29 DIAGNOSIS — R634 Abnormal weight loss: Secondary | ICD-10-CM

## 2019-10-29 DIAGNOSIS — C9000 Multiple myeloma not having achieved remission: Secondary | ICD-10-CM | POA: Diagnosis not present

## 2019-10-29 DIAGNOSIS — E538 Deficiency of other specified B group vitamins: Secondary | ICD-10-CM | POA: Diagnosis not present

## 2019-10-29 LAB — CBC WITH DIFFERENTIAL/PLATELET
Abs Immature Granulocytes: 0.02 10*3/uL (ref 0.00–0.07)
Basophils Absolute: 0.1 10*3/uL (ref 0.0–0.1)
Basophils Relative: 2 %
Eosinophils Absolute: 0.5 10*3/uL (ref 0.0–0.5)
Eosinophils Relative: 10 %
HCT: 34.8 % — ABNORMAL LOW (ref 39.0–52.0)
Hemoglobin: 11.9 g/dL — ABNORMAL LOW (ref 13.0–17.0)
Immature Granulocytes: 0 %
Lymphocytes Relative: 28 %
Lymphs Abs: 1.5 10*3/uL (ref 0.7–4.0)
MCH: 34.9 pg — ABNORMAL HIGH (ref 26.0–34.0)
MCHC: 34.2 g/dL (ref 30.0–36.0)
MCV: 102.1 fL — ABNORMAL HIGH (ref 80.0–100.0)
Monocytes Absolute: 0.6 10*3/uL (ref 0.1–1.0)
Monocytes Relative: 11 %
Neutro Abs: 2.8 10*3/uL (ref 1.7–7.7)
Neutrophils Relative %: 49 %
Platelets: 205 10*3/uL (ref 150–400)
RBC: 3.41 MIL/uL — ABNORMAL LOW (ref 4.22–5.81)
RDW: 14 % (ref 11.5–15.5)
WBC: 5.6 10*3/uL (ref 4.0–10.5)
nRBC: 0 % (ref 0.0–0.2)

## 2019-10-29 LAB — COMPREHENSIVE METABOLIC PANEL
ALT: 18 U/L (ref 0–44)
AST: 39 U/L (ref 15–41)
Albumin: 2.9 g/dL — ABNORMAL LOW (ref 3.5–5.0)
Alkaline Phosphatase: 81 U/L (ref 38–126)
Anion gap: 9 (ref 5–15)
BUN: 16 mg/dL (ref 8–23)
CO2: 20 mmol/L — ABNORMAL LOW (ref 22–32)
Calcium: 9.7 mg/dL (ref 8.9–10.3)
Chloride: 105 mmol/L (ref 98–111)
Creatinine, Ser: 0.97 mg/dL (ref 0.61–1.24)
GFR calc Af Amer: >60 mL/min (ref >60)
GFR calc non Af Amer: >60 mL/min (ref >60)
Glucose, Bld: 145 mg/dL — ABNORMAL HIGH (ref 70–99)
Potassium: 4.3 mmol/L (ref 3.5–5.1)
Sodium: 134 mmol/L — ABNORMAL LOW (ref 135–145)
Total Bilirubin: 0.6 mg/dL (ref 0.3–1.2)
Total Protein: 8.4 g/dL — ABNORMAL HIGH (ref 6.5–8.1)

## 2019-10-29 MED ORDER — LENALIDOMIDE 2.5 MG PO CAPS: ORAL_CAPSULE | ORAL | 0 refills | Status: DC

## 2019-10-29 NOTE — Progress Notes (Signed)
The patient has lost 40 lb due to wife death.

## 2019-10-30 ENCOUNTER — Telehealth: Payer: Self-pay

## 2019-10-30 LAB — PROTEIN ELECTROPHORESIS, SERUM
A/G Ratio: 0.6 — ABNORMAL LOW (ref 0.7–1.7)
Albumin ELP: 3 g/dL (ref 2.9–4.4)
Alpha-1-Globulin: 0.3 g/dL (ref 0.0–0.4)
Alpha-2-Globulin: 0.8 g/dL (ref 0.4–1.0)
Beta Globulin: 1.5 g/dL — ABNORMAL HIGH (ref 0.7–1.3)
Gamma Globulin: 2.2 g/dL — ABNORMAL HIGH (ref 0.4–1.8)
Globulin, Total: 4.7 g/dL — ABNORMAL HIGH (ref 2.2–3.9)
Total Protein ELP: 7.7 g/dL (ref 6.0–8.5)

## 2019-10-30 NOTE — Telephone Encounter (Signed)
Inbound call received from patient's son, Luis [name], returning call. Informed him of patient's weight loss and not eating. Son states he was made aware of the weight loss last night by the patient. Reports they will be keeping a closer eye on him, encouraging him to eat, and keep high protein foods stocked that patient enjoys eating. Reports In Home Nurse will be coming on Monday to assess him as well. Advised son should they need anything to reach out to our office. Son verbalizes understanding and denies any further questions or concerns.

## 2019-10-30 NOTE — Telephone Encounter (Signed)
Tryed to reach out to the patient son's ( both) Didn't get a answer / unable to leave a message for Duanne Moron was full. I was abkle to leave a message for Aaron Edelman. To inform them that the patient has lost 40 Lb from his last visit. The patient was given ensure and and a christmas bag with a lot of can chicken and tuna. The patient was understanding and agreeable to inform us if he need and ensure or Boost.

## 2019-11-01 ENCOUNTER — Other Ambulatory Visit: Payer: Self-pay | Admitting: Hematology and Oncology

## 2019-11-01 DIAGNOSIS — C9000 Multiple myeloma not having achieved remission: Secondary | ICD-10-CM

## 2019-11-06 ENCOUNTER — Telehealth: Payer: Self-pay

## 2019-11-06 NOTE — Telephone Encounter (Signed)
Revlimid script has been faxed over to [facility] ( spec). I have also called and given Rem #  To the pharmacy.

## 2019-11-07 ENCOUNTER — Other Ambulatory Visit: Payer: Self-pay

## 2019-11-08 ENCOUNTER — Inpatient Hospital Stay: Payer: Medicare Other

## 2019-11-08 VITALS — BP 146/75 | HR 77 | Temp 96.9°F | Resp 18

## 2019-11-08 DIAGNOSIS — E538 Deficiency of other specified B group vitamins: Secondary | ICD-10-CM

## 2019-11-08 DIAGNOSIS — C9000 Multiple myeloma not having achieved remission: Secondary | ICD-10-CM | POA: Diagnosis not present

## 2019-11-08 MED ORDER — CYANOCOBALAMIN 1000 MCG/ML IJ SOLN
1000.0000 ug | Freq: Once | INTRAMUSCULAR | Status: AC
  Administered 2019-11-08: 1000 ug via INTRAMUSCULAR

## 2019-11-08 NOTE — Patient Instructions (Signed)

## 2019-11-15 ENCOUNTER — Inpatient Hospital Stay: Payer: Medicare Other | Attending: Hematology and Oncology

## 2019-11-15 DIAGNOSIS — R634 Abnormal weight loss: Secondary | ICD-10-CM | POA: Insufficient documentation

## 2019-11-15 DIAGNOSIS — N289 Disorder of kidney and ureter, unspecified: Secondary | ICD-10-CM | POA: Insufficient documentation

## 2019-11-15 DIAGNOSIS — G4700 Insomnia, unspecified: Secondary | ICD-10-CM | POA: Insufficient documentation

## 2019-11-15 DIAGNOSIS — Z7984 Long term (current) use of oral hypoglycemic drugs: Secondary | ICD-10-CM | POA: Insufficient documentation

## 2019-11-15 DIAGNOSIS — C9000 Multiple myeloma not having achieved remission: Secondary | ICD-10-CM | POA: Insufficient documentation

## 2019-11-15 DIAGNOSIS — Z79899 Other long term (current) drug therapy: Secondary | ICD-10-CM | POA: Insufficient documentation

## 2019-11-15 DIAGNOSIS — M129 Arthropathy, unspecified: Secondary | ICD-10-CM | POA: Insufficient documentation

## 2019-11-15 DIAGNOSIS — D125 Benign neoplasm of sigmoid colon: Secondary | ICD-10-CM | POA: Insufficient documentation

## 2019-11-15 DIAGNOSIS — F418 Other specified anxiety disorders: Secondary | ICD-10-CM | POA: Insufficient documentation

## 2019-11-15 DIAGNOSIS — G4730 Sleep apnea, unspecified: Secondary | ICD-10-CM | POA: Insufficient documentation

## 2019-11-15 DIAGNOSIS — E785 Hyperlipidemia, unspecified: Secondary | ICD-10-CM | POA: Insufficient documentation

## 2019-11-15 DIAGNOSIS — K5730 Diverticulosis of large intestine without perforation or abscess without bleeding: Secondary | ICD-10-CM | POA: Insufficient documentation

## 2019-11-15 DIAGNOSIS — I252 Old myocardial infarction: Secondary | ICD-10-CM | POA: Insufficient documentation

## 2019-11-15 DIAGNOSIS — R0602 Shortness of breath: Secondary | ICD-10-CM | POA: Insufficient documentation

## 2019-11-15 DIAGNOSIS — I10 Essential (primary) hypertension: Secondary | ICD-10-CM | POA: Insufficient documentation

## 2019-11-15 DIAGNOSIS — E538 Deficiency of other specified B group vitamins: Secondary | ICD-10-CM | POA: Insufficient documentation

## 2019-11-15 DIAGNOSIS — Z87891 Personal history of nicotine dependence: Secondary | ICD-10-CM | POA: Insufficient documentation

## 2019-11-15 DIAGNOSIS — Z7982 Long term (current) use of aspirin: Secondary | ICD-10-CM | POA: Insufficient documentation

## 2019-11-15 DIAGNOSIS — K219 Gastro-esophageal reflux disease without esophagitis: Secondary | ICD-10-CM | POA: Insufficient documentation

## 2019-11-15 DIAGNOSIS — I2510 Atherosclerotic heart disease of native coronary artery without angina pectoris: Secondary | ICD-10-CM | POA: Insufficient documentation

## 2019-11-15 DIAGNOSIS — I509 Heart failure, unspecified: Secondary | ICD-10-CM | POA: Insufficient documentation

## 2019-11-15 DIAGNOSIS — E119 Type 2 diabetes mellitus without complications: Secondary | ICD-10-CM | POA: Insufficient documentation

## 2019-11-15 NOTE — Progress Notes (Signed)
Nutrition Follow-up:  Patient with multiple myeloma followed by Dr. Mike Gip.    Patient added back to RD's schedule at the request of Dr. Mike Gip.    Called patient and reports that he is grieving the death of his wife that passed on 09-20-2019 unexpectedly.  Patient reports that he is eating better but still does not have much of an appetite.  "I know what I need to do. I still have all your paperwork."  Reports that he drinks boost 1 time per day.  Breakfast this am was boost and breakfast bar, sometimes cooks eggs.  Reports last night went and ate cabbage and green beans.  Planning fish and vegetable tonight for dinner.      Medications: reviewed  Labs: reviewed  Anthropometrics:   Weight 192 lb on 12/21 decreased from 232 lb on 11/11.  17% weight loss in the last month and half, significant   NUTRITION DIAGNOSIS: Inadequate oral intake related to death of wife as evidenced by 17% weight loss and poor appetite   INTERVENTION:  Encouraged patient to set schedule to eat, nibble on foods during the day.  If able can increase boost shake to BID for additional calories and protein Encouraged at least 2 different foods at each meal with one being protein source (ie meat and vegetable) Patient has contact information and prefers to reach out to RD if needed in the future.   NEXT VISIT: no follow-up  Aracely Rickett B. Zenia Resides, [place], [place] Registered Dietitian [phone] (pager)
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
## 2019-11-22 ENCOUNTER — Other Ambulatory Visit: Payer: Self-pay

## 2019-11-22 ENCOUNTER — Inpatient Hospital Stay (HOSPITAL_BASED_OUTPATIENT_CLINIC_OR_DEPARTMENT_OTHER): Payer: Medicare Other | Admitting: Hematology and Oncology

## 2019-11-22 ENCOUNTER — Inpatient Hospital Stay: Payer: Medicare Other

## 2019-11-22 ENCOUNTER — Encounter: Payer: Self-pay | Admitting: Hematology and Oncology

## 2019-11-22 VITALS — BP 137/51 | HR 70 | Temp 97.5°F | Resp 18 | Wt 195.1 lb

## 2019-11-22 DIAGNOSIS — N289 Disorder of kidney and ureter, unspecified: Secondary | ICD-10-CM

## 2019-11-22 DIAGNOSIS — K5730 Diverticulosis of large intestine without perforation or abscess without bleeding: Secondary | ICD-10-CM | POA: Diagnosis not present

## 2019-11-22 DIAGNOSIS — Z7984 Long term (current) use of oral hypoglycemic drugs: Secondary | ICD-10-CM | POA: Diagnosis not present

## 2019-11-22 DIAGNOSIS — M129 Arthropathy, unspecified: Secondary | ICD-10-CM | POA: Diagnosis not present

## 2019-11-22 DIAGNOSIS — E785 Hyperlipidemia, unspecified: Secondary | ICD-10-CM | POA: Diagnosis not present

## 2019-11-22 DIAGNOSIS — G4730 Sleep apnea, unspecified: Secondary | ICD-10-CM | POA: Diagnosis not present

## 2019-11-22 DIAGNOSIS — F418 Other specified anxiety disorders: Secondary | ICD-10-CM | POA: Diagnosis not present

## 2019-11-22 DIAGNOSIS — D539 Nutritional anemia, unspecified: Secondary | ICD-10-CM | POA: Diagnosis not present

## 2019-11-22 DIAGNOSIS — C9000 Multiple myeloma not having achieved remission: Secondary | ICD-10-CM | POA: Diagnosis present

## 2019-11-22 DIAGNOSIS — E538 Deficiency of other specified B group vitamins: Secondary | ICD-10-CM

## 2019-11-22 DIAGNOSIS — I10 Essential (primary) hypertension: Secondary | ICD-10-CM | POA: Diagnosis not present

## 2019-11-22 DIAGNOSIS — R634 Abnormal weight loss: Secondary | ICD-10-CM

## 2019-11-22 DIAGNOSIS — R0602 Shortness of breath: Secondary | ICD-10-CM | POA: Diagnosis not present

## 2019-11-22 DIAGNOSIS — I252 Old myocardial infarction: Secondary | ICD-10-CM | POA: Diagnosis not present

## 2019-11-22 DIAGNOSIS — Z87891 Personal history of nicotine dependence: Secondary | ICD-10-CM | POA: Diagnosis not present

## 2019-11-22 DIAGNOSIS — E119 Type 2 diabetes mellitus without complications: Secondary | ICD-10-CM | POA: Diagnosis not present

## 2019-11-22 DIAGNOSIS — Z7982 Long term (current) use of aspirin: Secondary | ICD-10-CM | POA: Diagnosis not present

## 2019-11-22 DIAGNOSIS — D125 Benign neoplasm of sigmoid colon: Secondary | ICD-10-CM | POA: Diagnosis not present

## 2019-11-22 DIAGNOSIS — I509 Heart failure, unspecified: Secondary | ICD-10-CM | POA: Diagnosis not present

## 2019-11-22 DIAGNOSIS — G4700 Insomnia, unspecified: Secondary | ICD-10-CM | POA: Diagnosis not present

## 2019-11-22 DIAGNOSIS — I2510 Atherosclerotic heart disease of native coronary artery without angina pectoris: Secondary | ICD-10-CM | POA: Diagnosis not present

## 2019-11-22 DIAGNOSIS — K219 Gastro-esophageal reflux disease without esophagitis: Secondary | ICD-10-CM | POA: Diagnosis not present

## 2019-11-22 DIAGNOSIS — Z79899 Other long term (current) drug therapy: Secondary | ICD-10-CM | POA: Diagnosis not present

## 2019-11-22 LAB — COMPREHENSIVE METABOLIC PANEL
ALT: 20 U/L (ref 0–44)
AST: 37 U/L (ref 15–41)
Albumin: 2.9 g/dL — ABNORMAL LOW (ref 3.5–5.0)
Alkaline Phosphatase: 74 U/L (ref 38–126)
Anion gap: 6 (ref 5–15)
BUN: 23 mg/dL (ref 8–23)
CO2: 20 mmol/L — ABNORMAL LOW (ref 22–32)
Calcium: 9 mg/dL (ref 8.9–10.3)
Chloride: 109 mmol/L (ref 98–111)
Creatinine, Ser: 0.87 mg/dL (ref 0.61–1.24)
GFR calc Af Amer: >60 mL/min (ref >60)
GFR calc non Af Amer: >60 mL/min (ref >60)
Glucose, Bld: 99 mg/dL (ref 70–99)
Potassium: 4.4 mmol/L (ref 3.5–5.1)
Sodium: 135 mmol/L (ref 135–145)
Total Bilirubin: 0.9 mg/dL (ref 0.3–1.2)
Total Protein: 7.5 g/dL (ref 6.5–8.1)

## 2019-11-22 LAB — CBC WITH DIFFERENTIAL/PLATELET
Abs Immature Granulocytes: 0.01 10*3/uL (ref 0.00–0.07)
Basophils Absolute: 0.1 10*3/uL (ref 0.0–0.1)
Basophils Relative: 2 %
Eosinophils Absolute: 0.2 10*3/uL (ref 0.0–0.5)
Eosinophils Relative: 5 %
HCT: 31.7 % — ABNORMAL LOW (ref 39.0–52.0)
Hemoglobin: 10.6 g/dL — ABNORMAL LOW (ref 13.0–17.0)
Immature Granulocytes: 0 %
Lymphocytes Relative: 31 %
Lymphs Abs: 0.9 10*3/uL (ref 0.7–4.0)
MCH: 34.8 pg — ABNORMAL HIGH (ref 26.0–34.0)
MCHC: 33.4 g/dL (ref 30.0–36.0)
MCV: 103.9 fL — ABNORMAL HIGH (ref 80.0–100.0)
Monocytes Absolute: 0.4 10*3/uL (ref 0.1–1.0)
Monocytes Relative: 14 %
Neutro Abs: 1.4 10*3/uL — ABNORMAL LOW (ref 1.7–7.7)
Neutrophils Relative %: 48 %
Platelets: 121 10*3/uL — ABNORMAL LOW (ref 150–400)
RBC: 3.05 MIL/uL — ABNORMAL LOW (ref 4.22–5.81)
RDW: 16.1 % — ABNORMAL HIGH (ref 11.5–15.5)
WBC: 3 10*3/uL — ABNORMAL LOW (ref 4.0–10.5)
nRBC: 0 % (ref 0.0–0.2)

## 2019-11-22 NOTE — Progress Notes (Signed)
Patient here for follow up. Denies any concerns. Reports he is eating better. LOves Chocolate boost. Reports his sons are attempted to get someone to come into his home 2 days a week.

## 2019-11-23 LAB — PROTEIN ELECTROPHORESIS, SERUM
A/G Ratio: 0.7 (ref 0.7–1.7)
Albumin ELP: 2.9 g/dL (ref 2.9–4.4)
Alpha-1-Globulin: 0.2 g/dL (ref 0.0–0.4)
Alpha-2-Globulin: 0.6 g/dL (ref 0.4–1.0)
Beta Globulin: 1.4 g/dL — ABNORMAL HIGH (ref 0.7–1.3)
Gamma Globulin: 1.9 g/dL — ABNORMAL HIGH (ref 0.4–1.8)
Globulin, Total: 4.2 g/dL — ABNORMAL HIGH (ref 2.2–3.9)
Total Protein ELP: 7.1 g/dL (ref 6.0–8.5)

## 2019-12-05 ENCOUNTER — Other Ambulatory Visit: Payer: Self-pay

## 2019-12-05 ENCOUNTER — Other Ambulatory Visit: Payer: Self-pay | Admitting: Hematology and Oncology

## 2019-12-05 DIAGNOSIS — C9000 Multiple myeloma not having achieved remission: Secondary | ICD-10-CM

## 2019-12-06 ENCOUNTER — Inpatient Hospital Stay: Payer: Medicare Other

## 2019-12-06 VITALS — BP 129/81 | HR 51 | Temp 95.0°F | Resp 18

## 2019-12-06 DIAGNOSIS — D539 Nutritional anemia, unspecified: Secondary | ICD-10-CM

## 2019-12-06 DIAGNOSIS — C9000 Multiple myeloma not having achieved remission: Secondary | ICD-10-CM | POA: Diagnosis not present

## 2019-12-06 DIAGNOSIS — E538 Deficiency of other specified B group vitamins: Secondary | ICD-10-CM

## 2019-12-06 LAB — CBC WITH DIFFERENTIAL/PLATELET
Abs Immature Granulocytes: 0.01 10*3/uL (ref 0.00–0.07)
Basophils Absolute: 0.1 10*3/uL (ref 0.0–0.1)
Basophils Relative: 1 %
Eosinophils Absolute: 0.4 10*3/uL (ref 0.0–0.5)
Eosinophils Relative: 8 %
HCT: 31.9 % — ABNORMAL LOW (ref 39.0–52.0)
Hemoglobin: 10.8 g/dL — ABNORMAL LOW (ref 13.0–17.0)
Immature Granulocytes: 0 %
Lymphocytes Relative: 32 %
Lymphs Abs: 1.4 10*3/uL (ref 0.7–4.0)
MCH: 35.3 pg — ABNORMAL HIGH (ref 26.0–34.0)
MCHC: 33.9 g/dL (ref 30.0–36.0)
MCV: 104.2 fL — ABNORMAL HIGH (ref 80.0–100.0)
Monocytes Absolute: 0.5 10*3/uL (ref 0.1–1.0)
Monocytes Relative: 12 %
Neutro Abs: 2.1 10*3/uL (ref 1.7–7.7)
Neutrophils Relative %: 47 %
Platelets: 127 10*3/uL — ABNORMAL LOW (ref 150–400)
RBC: 3.06 MIL/uL — ABNORMAL LOW (ref 4.22–5.81)
RDW: 16 % — ABNORMAL HIGH (ref 11.5–15.5)
WBC: 4.4 10*3/uL (ref 4.0–10.5)
nRBC: 0 % (ref 0.0–0.2)

## 2019-12-06 LAB — BASIC METABOLIC PANEL
Anion gap: 11 (ref 5–15)
BUN: 25 mg/dL — ABNORMAL HIGH (ref 8–23)
CO2: 20 mmol/L — ABNORMAL LOW (ref 22–32)
Calcium: 9.5 mg/dL (ref 8.9–10.3)
Chloride: 106 mmol/L (ref 98–111)
Creatinine, Ser: 0.92 mg/dL (ref 0.61–1.24)
GFR calc Af Amer: >60 mL/min (ref >60)
GFR calc non Af Amer: >60 mL/min (ref >60)
Glucose, Bld: 121 mg/dL — ABNORMAL HIGH (ref 70–99)
Potassium: 4.6 mmol/L (ref 3.5–5.1)
Sodium: 137 mmol/L (ref 135–145)

## 2019-12-06 LAB — FERRITIN: Ferritin: 123 ng/mL (ref 24–336)

## 2019-12-06 LAB — FOLATE: Folate: 15.1 ng/mL (ref >5.9)

## 2019-12-06 LAB — IRON AND TIBC
Iron: 189 ug/dL — ABNORMAL HIGH (ref 45–182)
Saturation Ratios: 65 % — ABNORMAL HIGH (ref 17.9–39.5)
TIBC: 293 ug/dL (ref 250–450)
UIBC: 104 ug/dL

## 2019-12-06 MED ORDER — CYANOCOBALAMIN 1000 MCG/ML IJ SOLN
1000.0000 ug | Freq: Once | INTRAMUSCULAR | Status: AC
  Administered 2019-12-06: 1000 ug via INTRAMUSCULAR

## 2019-12-06 NOTE — Patient Instructions (Signed)

## 2019-12-10 ENCOUNTER — Telehealth: Payer: Self-pay

## 2019-12-10 NOTE — Telephone Encounter (Signed)
Questioned if patient started Revlimid last week. Patient denies. Informed patient, per Dr. Mike Gip, to start Revlimid today. Patient verbalizes understanding and denies any further questions or concerns.

## 2019-12-10 NOTE — Telephone Encounter (Signed)
Left a message for the patient to return the call at center at his earlies time.

## 2019-12-12 ENCOUNTER — Inpatient Hospital Stay: Payer: Medicare Other

## 2020-01-01 ENCOUNTER — Other Ambulatory Visit: Payer: Self-pay

## 2020-01-01 DIAGNOSIS — C9000 Multiple myeloma not having achieved remission: Secondary | ICD-10-CM

## 2020-01-01 MED ORDER — LENALIDOMIDE 2.5 MG PO CAPS: 2.5000 mg | ORAL_CAPSULE | Freq: Every day | ORAL | 0 refills | Status: DC

## 2020-01-03 ENCOUNTER — Other Ambulatory Visit: Payer: Self-pay

## 2020-01-03 ENCOUNTER — Inpatient Hospital Stay: Payer: Medicare Other | Attending: Hematology and Oncology

## 2020-01-03 VITALS — BP 154/76 | HR 52 | Temp 96.9°F | Resp 18

## 2020-01-03 DIAGNOSIS — Z79899 Other long term (current) drug therapy: Secondary | ICD-10-CM | POA: Insufficient documentation

## 2020-01-03 DIAGNOSIS — E538 Deficiency of other specified B group vitamins: Secondary | ICD-10-CM | POA: Insufficient documentation

## 2020-01-03 MED ORDER — CYANOCOBALAMIN 1000 MCG/ML IJ SOLN
1000.0000 ug | Freq: Once | INTRAMUSCULAR | Status: AC
  Administered 2020-01-03: 14:00:00 1000 ug via INTRAMUSCULAR

## 2020-01-03 NOTE — Patient Instructions (Signed)

## 2020-01-09 ENCOUNTER — Inpatient Hospital Stay: Payer: Medicare Other

## 2020-01-10 IMAGING — US US EXTREM LOW VENOUS
1 series · 13 of 24 positions shown · non-contrast
Comparison: None.

CLINICAL DATA: Bilateral lower extremity leg pain and edema



[Series 1: us extrem low venous · 0.08mm/px · 13 of 70 slices shown]
[im 1/70]
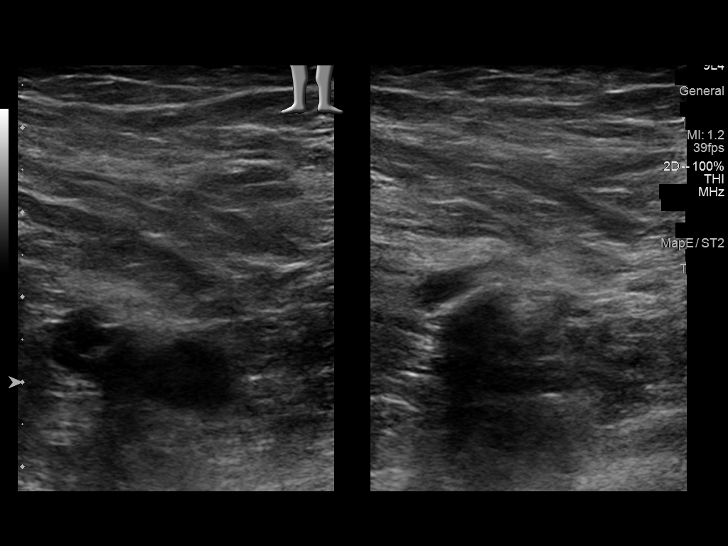
[im 7/70]
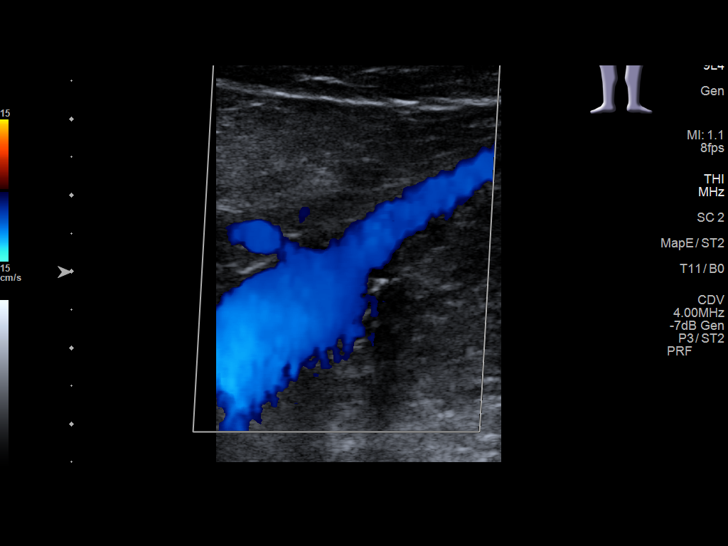
[im 13/70]
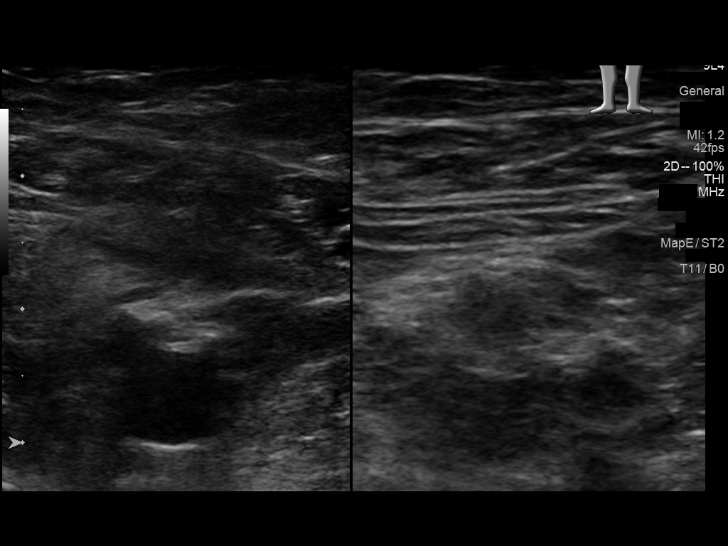
[im 19/70]
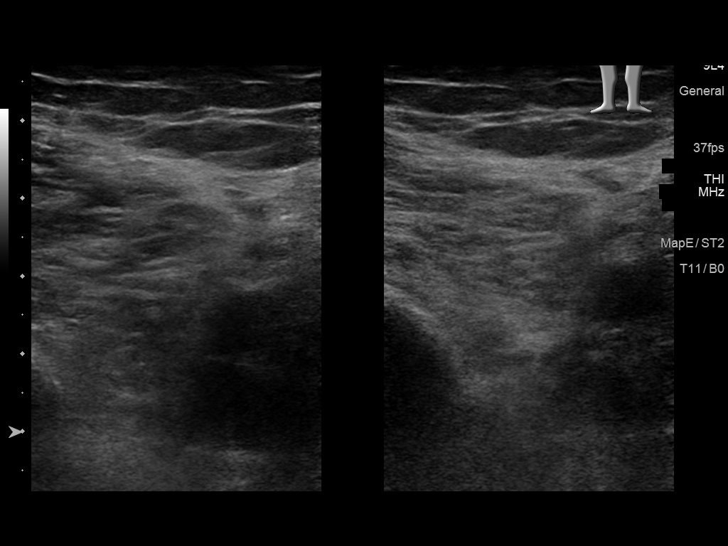
[im 25/70]
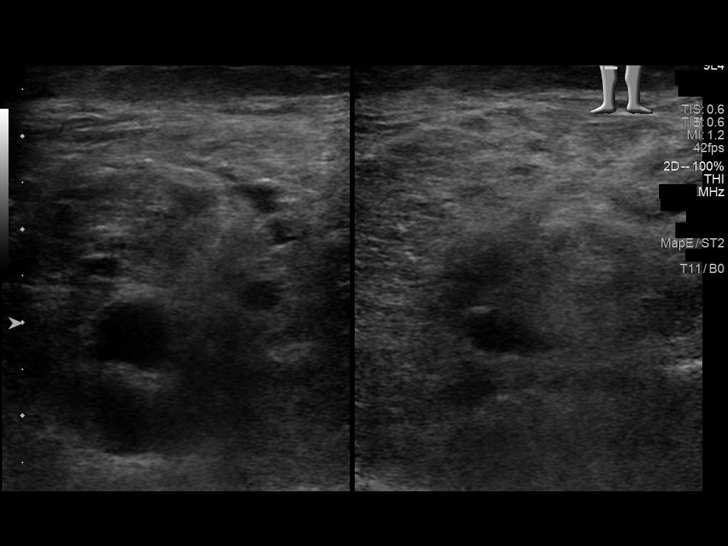
[im 31/70]
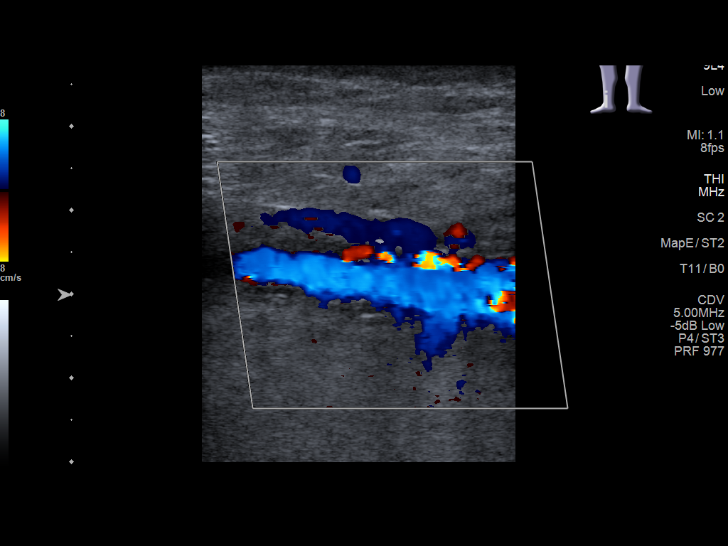
[im 37/70]
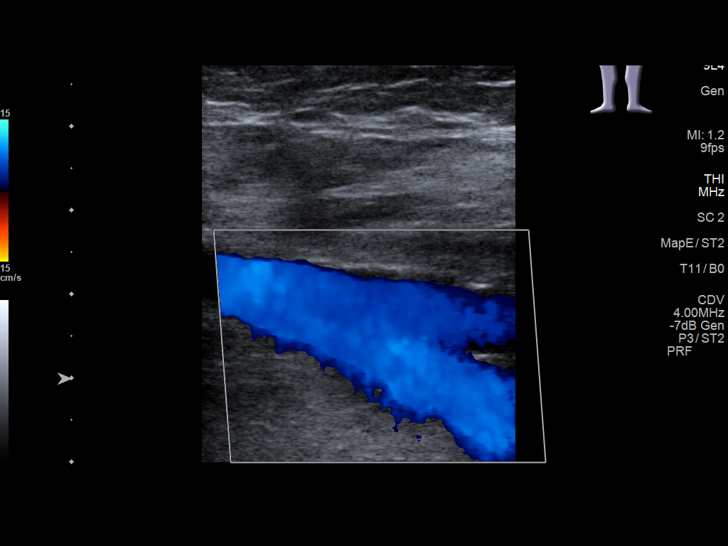
[im 40/70]
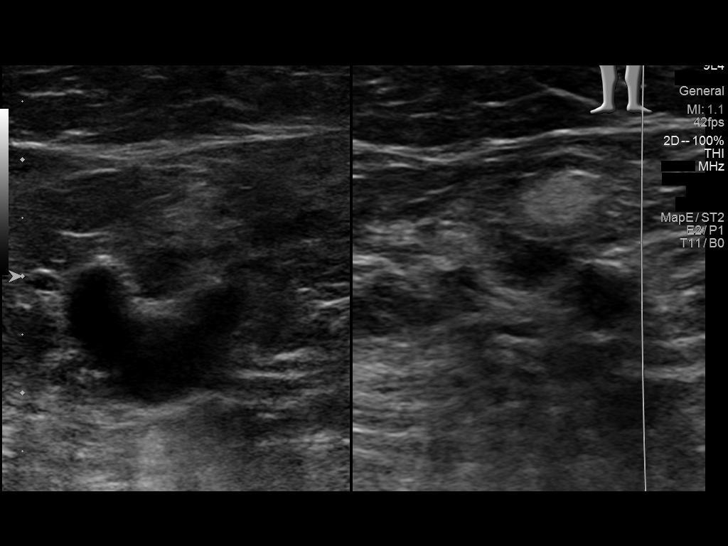
[im 46/70]
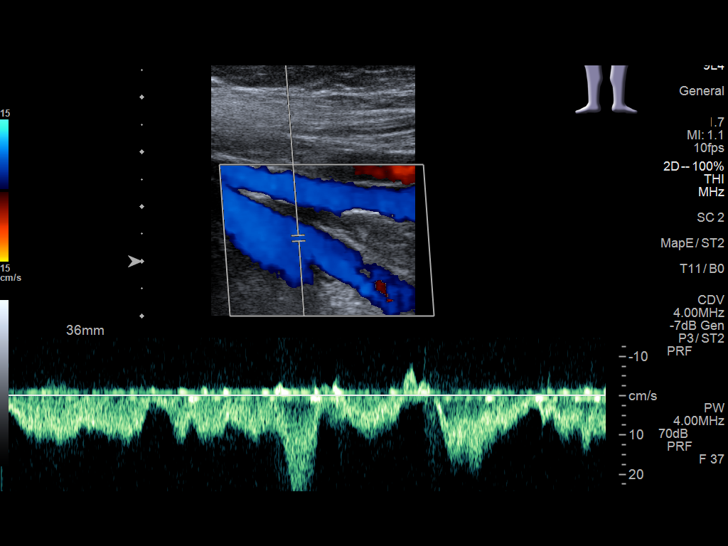
[im 52/70]
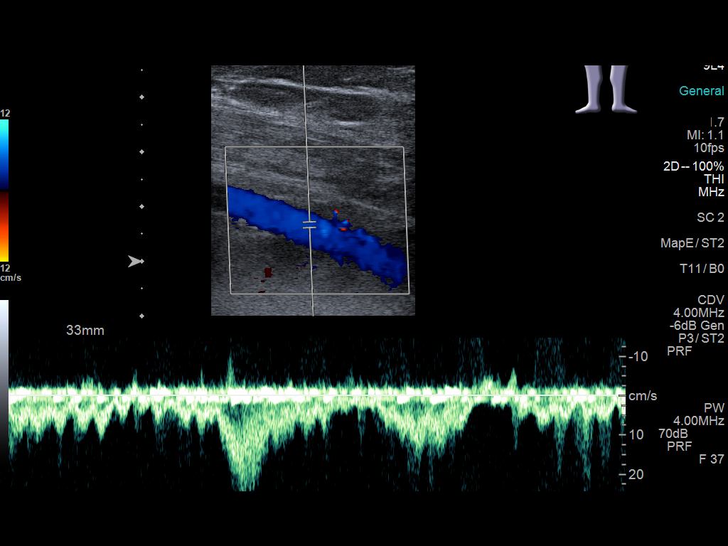
[im 58/70]
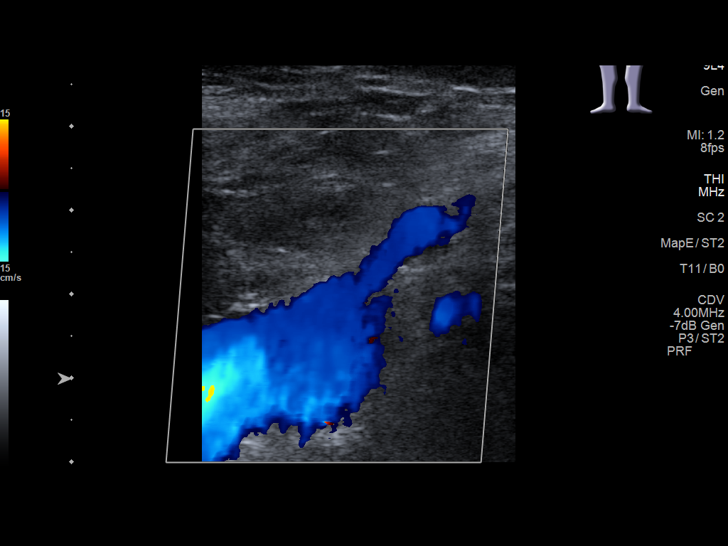
[im 64/70]
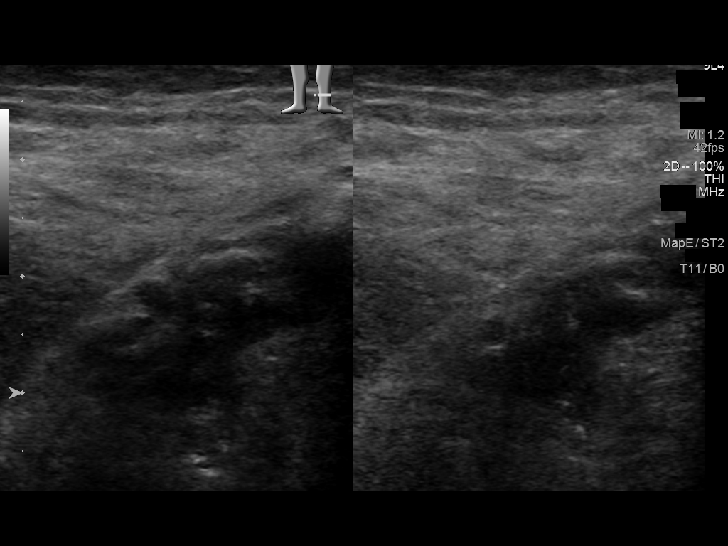
[im 70/70]
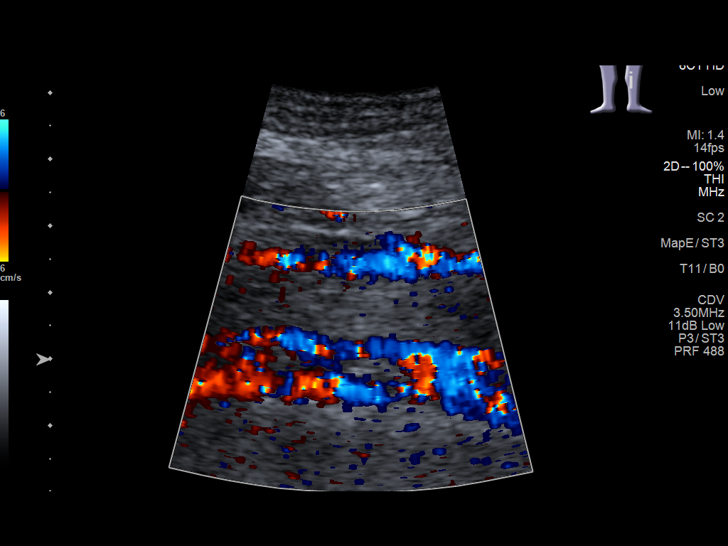

[13 of 24 positions shown; findings below may reference images not displayed]

FINDINGS: RIGHT LOWER EXTREMITY

Common Femoral Vein: No evidence of thrombus. Normal
compressibility, respiratory phasicity and response to augmentation.

Saphenofemoral Junction: No evidence of thrombus. Normal
compressibility and flow on color Doppler imaging.

Profunda Femoral Vein: No evidence of thrombus. Normal
compressibility and flow on color Doppler imaging.

Femoral Vein: No evidence of thrombus. Normal compressibility,
respiratory phasicity and response to augmentation.

Popliteal Vein: No evidence of thrombus. Normal compressibility,
respiratory phasicity and response to augmentation.

Calf Veins: No evidence of thrombus. Normal compressibility and flow
on color Doppler imaging.

Superficial Great Saphenous Vein: No evidence of thrombus. Normal
compressibility.

Venous Reflux:  Not assessed

Other Findings:  None.

LEFT LOWER EXTREMITY

Common Femoral Vein: No evidence of thrombus. Normal
compressibility, respiratory phasicity and response to augmentation.

Saphenofemoral Junction: No evidence of thrombus. Normal
compressibility and flow on color Doppler imaging.

Profunda Femoral Vein: No evidence of thrombus. Normal
compressibility and flow on color Doppler imaging.

Femoral Vein: No evidence of thrombus. Normal compressibility,
respiratory phasicity and response to augmentation.

Popliteal Vein: No evidence of thrombus. Normal compressibility,
respiratory phasicity and response to augmentation.

Calf Veins: No evidence of thrombus. Normal compressibility and flow
on color Doppler imaging.

Superficial Great Saphenous Vein: No evidence of thrombus. Normal
compressibility.

Venous Reflux:  Not assessed

Other Findings:  None.
IMPRESSION: No evidence of deep venous thrombosis in either lower extremity.

## 2020-01-17 ENCOUNTER — Other Ambulatory Visit: Payer: Self-pay

## 2020-01-17 ENCOUNTER — Inpatient Hospital Stay: Payer: Medicare Other | Attending: Nurse Practitioner | Admitting: Nurse Practitioner

## 2020-01-17 ENCOUNTER — Encounter: Payer: Self-pay | Admitting: Nurse Practitioner

## 2020-01-17 ENCOUNTER — Inpatient Hospital Stay: Payer: Medicare Other

## 2020-01-17 VITALS — BP 137/70 | HR 51 | Temp 98.3°F | Resp 18 | Ht 67.0 in | Wt 194.4 lb

## 2020-01-17 DIAGNOSIS — C9000 Multiple myeloma not having achieved remission: Secondary | ICD-10-CM | POA: Diagnosis not present

## 2020-01-17 DIAGNOSIS — E119 Type 2 diabetes mellitus without complications: Secondary | ICD-10-CM | POA: Insufficient documentation

## 2020-01-17 DIAGNOSIS — Z7982 Long term (current) use of aspirin: Secondary | ICD-10-CM | POA: Insufficient documentation

## 2020-01-17 DIAGNOSIS — F418 Other specified anxiety disorders: Secondary | ICD-10-CM | POA: Diagnosis not present

## 2020-01-17 DIAGNOSIS — Z79899 Other long term (current) drug therapy: Secondary | ICD-10-CM | POA: Diagnosis not present

## 2020-01-17 DIAGNOSIS — D539 Nutritional anemia, unspecified: Secondary | ICD-10-CM | POA: Diagnosis not present

## 2020-01-17 DIAGNOSIS — I509 Heart failure, unspecified: Secondary | ICD-10-CM | POA: Insufficient documentation

## 2020-01-17 DIAGNOSIS — D125 Benign neoplasm of sigmoid colon: Secondary | ICD-10-CM | POA: Insufficient documentation

## 2020-01-17 DIAGNOSIS — I10 Essential (primary) hypertension: Secondary | ICD-10-CM | POA: Insufficient documentation

## 2020-01-17 DIAGNOSIS — M549 Dorsalgia, unspecified: Secondary | ICD-10-CM | POA: Diagnosis not present

## 2020-01-17 DIAGNOSIS — I2510 Atherosclerotic heart disease of native coronary artery without angina pectoris: Secondary | ICD-10-CM | POA: Insufficient documentation

## 2020-01-17 DIAGNOSIS — K219 Gastro-esophageal reflux disease without esophagitis: Secondary | ICD-10-CM | POA: Insufficient documentation

## 2020-01-17 DIAGNOSIS — I252 Old myocardial infarction: Secondary | ICD-10-CM | POA: Insufficient documentation

## 2020-01-17 DIAGNOSIS — R634 Abnormal weight loss: Secondary | ICD-10-CM | POA: Insufficient documentation

## 2020-01-17 DIAGNOSIS — E785 Hyperlipidemia, unspecified: Secondary | ICD-10-CM | POA: Insufficient documentation

## 2020-01-17 DIAGNOSIS — G4730 Sleep apnea, unspecified: Secondary | ICD-10-CM | POA: Insufficient documentation

## 2020-01-17 DIAGNOSIS — N289 Disorder of kidney and ureter, unspecified: Secondary | ICD-10-CM | POA: Insufficient documentation

## 2020-01-17 DIAGNOSIS — E538 Deficiency of other specified B group vitamins: Secondary | ICD-10-CM | POA: Diagnosis not present

## 2020-01-17 DIAGNOSIS — M129 Arthropathy, unspecified: Secondary | ICD-10-CM | POA: Diagnosis not present

## 2020-01-17 LAB — COMPREHENSIVE METABOLIC PANEL
ALT: 17 U/L (ref 0–44)
AST: 31 U/L (ref 15–41)
Albumin: 3.4 g/dL — ABNORMAL LOW (ref 3.5–5.0)
Alkaline Phosphatase: 77 U/L (ref 38–126)
Anion gap: 10 (ref 5–15)
BUN: 27 mg/dL — ABNORMAL HIGH (ref 8–23)
CO2: 19 mmol/L — ABNORMAL LOW (ref 22–32)
Calcium: 9.7 mg/dL (ref 8.9–10.3)
Chloride: 107 mmol/L (ref 98–111)
Creatinine, Ser: 1.08 mg/dL (ref 0.61–1.24)
GFR calc Af Amer: >60 mL/min (ref >60)
GFR calc non Af Amer: >60 mL/min (ref >60)
Glucose, Bld: 146 mg/dL — ABNORMAL HIGH (ref 70–99)
Potassium: 4.8 mmol/L (ref 3.5–5.1)
Sodium: 136 mmol/L (ref 135–145)
Total Bilirubin: 0.7 mg/dL (ref 0.3–1.2)
Total Protein: 8 g/dL (ref 6.5–8.1)

## 2020-01-17 LAB — CBC WITH DIFFERENTIAL/PLATELET
Abs Immature Granulocytes: 0.01 10*3/uL (ref 0.00–0.07)
Basophils Absolute: 0.1 10*3/uL (ref 0.0–0.1)
Basophils Relative: 2 %
Eosinophils Absolute: 0.2 10*3/uL (ref 0.0–0.5)
Eosinophils Relative: 6 %
HCT: 33.7 % — ABNORMAL LOW (ref 39.0–52.0)
Hemoglobin: 11.4 g/dL — ABNORMAL LOW (ref 13.0–17.0)
Immature Granulocytes: 0 %
Lymphocytes Relative: 32 %
Lymphs Abs: 1.1 10*3/uL (ref 0.7–4.0)
MCH: 35.8 pg — ABNORMAL HIGH (ref 26.0–34.0)
MCHC: 33.8 g/dL (ref 30.0–36.0)
MCV: 106 fL — ABNORMAL HIGH (ref 80.0–100.0)
Monocytes Absolute: 0.4 10*3/uL (ref 0.1–1.0)
Monocytes Relative: 12 %
Neutro Abs: 1.7 10*3/uL (ref 1.7–7.7)
Neutrophils Relative %: 48 %
Platelets: 120 10*3/uL — ABNORMAL LOW (ref 150–400)
RBC: 3.18 MIL/uL — ABNORMAL LOW (ref 4.22–5.81)
RDW: 14.8 % (ref 11.5–15.5)
WBC: 3.6 10*3/uL — ABNORMAL LOW (ref 4.0–10.5)
nRBC: 0 % (ref 0.0–0.2)

## 2020-01-17 MED ORDER — LENALIDOMIDE 2.5 MG PO CAPS: 2.5000 mg | ORAL_CAPSULE | Freq: Every day | ORAL | 0 refills | Status: DC

## 2020-01-17 NOTE — Progress Notes (Signed)
No new changes noted today 

## 2020-01-17 NOTE — Progress Notes (Signed)
[facility]  [address], [address] [place], [place] [address] Phone: [phone]  Fax: [phone]   Clinic Day:  01/17/2020  Referring physician: Tracie Harrier, MD  Chief Complaint: Luis Beamer. is a 74 y.o. male with multiple myeloma on Revlimid who is seen for 76-monthevaluation  HPI: Patient was last seen by Dr. CMike Gipon 11/22/2019.  At that time Revlimid was held given low counts.  Per patient he restarted on 12/10/2019 and completed 3-week cycle on 12/31/2019.  He says that he generally takes 1 to 3 weeks off between cycles due to low counts.  No interval infections, fevers, malaise.  He continues to grieve the unexpected loss of his wife.  In the interim, he has seen Dr. CSharlet Salinafor ongoing back pain.  MRI from May 2020 revealed evidence of a T11 compression fracture and spinal stenosis.  Drinks wine at night to help with sleep.  He continues monthly B12 injections, last on 01/03/2020.  Has been drinking Ensure for nutrition and says he has had a hard time coping with preparing meals for 1 person.  He wishes his children live closer.  Otherwise he feels at baseline and denies other specific complaints.   Past Medical History:  Diagnosis Date  . Angina pectoris ([place]   . Anxiety   . Arthritis   . CHF (congestive heart failure) ([place]   . Coronary artery disease   . Depression   . Diabetes mellitus without complication ([facility]    Patient takes Metformin.  . Diverticulosis   . GERD (gastroesophageal reflux disease)   . Hyperlipidemia   . Hypertension   . Multiple myeloma ([place]   . Multiple [place]oma ([place] 03/27/2015  . Myocardial infarction ([facility] April 2001   widowmaker  . Shortness of breath dyspnea   . Sleep apnea    No CPAP @ present  . Spinal stenosis     Past Surgical History:  Procedure Laterality Date  . CARDIAC CATHETERIZATION    . CARPAL TUNNEL RELEASE    . CATARACT EXTRACTION    . COLONOSCOPY WITH PROPOFOL N/A 11/11/2015   Procedure: COLONOSCOPY WITH PROPOFOL;  Surgeon: MLollie Sails MD;  [facility];  Service: Endoscopy;  Laterality: N/A;  . CORONARY ARTERY BYPASS GRAFT    . EYE SURGERY Bilateral    Cataract Extraction  . INGUINAL HERNIA REPAIR    . JOINT REPLACEMENT Right 2008   Right Total Hip Replacement  . PILONIDAL CYST EXCISION    . ROTATOR CUFF REPAIR    . TOTAL HIP ARTHROPLASTY Right   . VENTRAL HERNIA REPAIR N/A 08/15/2015   Procedure: VENTRAL HERNIA REPAIR WITH MESH ;  Surgeon: JLeonie Green MD;  Location: [facility] ORS;  Service: General;  Laterality: N/A;    Family History  Problem Relation Age of Onset  . Heart disease Father   . Stroke Mother   . Prostate cancer Maternal Grandfather [phone]   Social History:  reports that he quit smoking about 19 years ago. His smoking use included cigarettes. He has a 60.00 pack-year smoking history. He quit smokeless tobacco use about 19 years ago. He reports that he does not drink alcohol or use drugs. He stated that his wife wanted to retire, but that would affect his medication coverage. He has projects that he likes to do at home. He does wood working. He has a class on Wednesdays (wood turning).He has a new grand-daughter, DDeetta Perla His wife's name was SCollie SiadHis wife passed  away on 09/18/2019 from a brain aneurysm at age 86. [place] He is taking it day by day. The patient is alone today.  Allergies:  Allergies  Allergen Reactions  . Pravachol [Pravastatin]   . Pravastatin Sodium Other (See Comments)  . Statins Other (See Comments)    Muscle aches  . Zocor [Simvastatin] Other (See Comments)    Muscle aches    Current Medications: Current Outpatient Medications  Medication Sig Dispense Refill  . ALPRAZolam (XANAX) 0.25 MG tablet Take 0.25 mg by mouth at bedtime as needed. for sleep  3  . aspirin 81 MG tablet Take 81 mg by mouth daily.     [name] Pollen 500 MG CHEW  Chew 1 tablet by mouth 2 (two) times daily.     . Calcium Carbonate-Vitamin D 600-400 MG-UNIT chew tablet Chew 2 tablets by mouth daily.     . Cyanocobalamin (VITAMIN B-12 IJ) Inject 1 Dose as directed every 30 (thirty) days.     . ergocalciferol (VITAMIN D2) 50000 UNITS capsule Take 50,000 Units by mouth once a week.    Marland Kitchen glucose blood (ONE TOUCH ULTRA TEST) test strip USE ONCE DAILY. USE AS INSTRUCTED. DX E11.9    . isosorbide mononitrate (IMDUR) 30 MG 24 hr tablet Take 30 mg by mouth daily.    Marland Kitchen lenalidomide (REVLIMID) 2.5 MG capsule Take 1 capsule (2.5 mg total) by mouth daily. [employer] Auth # [id number]     Date Obtained 01/01/2020 21 capsule 0  . lisinopril (PRINIVIL,ZESTRIL) 20 MG tablet Take 1 tablet by mouth daily.    Marland Kitchen losartan (COZAAR) 25 MG tablet Take 25 mg by mouth daily.     Marland Kitchen lovastatin (MEVACOR) 40 MG tablet Take 20 mg by mouth at bedtime.     . metFORMIN (GLUCOPHAGE) 500 MG tablet 500 mg 2 (two) times daily with a meal.     . omeprazole (PRILOSEC) 20 MG capsule Take 20 mg by mouth daily.    . propranolol (INDERAL) 10 MG tablet Take 10 mg by mouth 2 (two) times daily.    . traMADol (ULTRAM) 50 MG tablet TAKE 1 TABLET BY MOUTH THREE TIMES A DAY AS NEEDED 60 tablet 0  . amoxicillin (AMOXIL) 500 MG capsule TAKE 4 CAPSULES 1 HOUR PRIOR TO DENTAL APPT.     No current facility-administered medications for this visit.    Review of Systems  Constitutional: Positive for weight loss. Negative for chills, diaphoresis, fever and malaise/fatigue.  HENT: Negative.  Negative for congestion, ear pain, hearing loss, nosebleeds, sinus pain, sore throat and tinnitus.   Eyes: Negative.  Negative for blurred vision and double vision.  Respiratory: Negative for cough, hemoptysis, sputum production, shortness of breath (with exertion-stable) and wheezing.        Sleep apnea, currently not using CPAP.   Cardiovascular: Negative.  Negative for chest pain, palpitations, orthopnea, leg swelling and PND.    Gastrointestinal: Negative.  Negative for abdominal pain, blood in stool, constipation, diarrhea, melena, nausea and vomiting.       Drinks boost  Genitourinary: Negative.  Negative for dysuria, flank pain, frequency, hematuria and urgency.  Musculoskeletal: Negative for back pain (spinal stenosis & hx of compression fx. ), falls, joint pain, myalgias and neck pain.  Skin: Negative.  Negative for itching and rash.  Neurological: Positive for weakness (generalized; stable). Negative for dizziness, tremors, sensory change, focal weakness and headaches.  Requires cane for ambulation. Limited ROM.  Endo/Heme/Allergies: Does not bruise/bleed easily.       Diabetes.   Psychiatric/Behavioral: Positive for depression and substance abuse (EtOH). Negative for memory loss. The patient has insomnia (poor sleep secondary to grief). The patient is not nervous/anxious.   All other systems reviewed and are negative.  Performance status (ECOG): 1  Vitals Blood pressure 137/70, pulse (!) 51, temperature 98.3 F (36.8 C), temperature source Tympanic, resp. rate 18, height 5' 7" (1.702 m), weight 194 lb 7.1 oz (88.2 kg), SpO2 99 %.   Physical Exam  Constitutional: He is oriented to person, place, and time. He appears well-developed and well-nourished.  Elderly male.  Unaccompanied.  Wearing mask.  HENT:  Head: Atraumatic.  Eyes: Conjunctivae are normal. No scleral icterus.  Cardiovascular: Normal rate and regular rhythm.  Pulmonary/Chest: Effort normal. No respiratory distress.  Abdominal: He exhibits no distension. There is no abdominal tenderness.  Musculoskeletal:     Comments: Cane for ambulation  Lymphadenopathy:    He has no cervical adenopathy.  Neurological: He is alert and oriented to person, place, and time.  Psychiatric: His speech is normal and behavior is normal. Thought content normal. He exhibits a depressed mood. He expresses no suicidal ideation. He expresses no suicidal plans.     Appointment on 01/17/2020  Component Date Value Ref Range Status  . Sodium 01/17/2020 136  135 - 145 mmol/L Final  . Potassium 01/17/2020 4.8  3.5 - 5.1 mmol/L Final  . Chloride 01/17/2020 107  98 - 111 mmol/L Final  . CO2 01/17/2020 19* 22 - 32 mmol/L Final  . Glucose, Bld 01/17/2020 146* 70 - 99 mg/dL Final   Glucose reference range applies only to samples taken after fasting for at least 8 hours.  . BUN 01/17/2020 27* 8 - 23 mg/dL Final  . Creatinine, Ser 01/17/2020 1.08  0.61 - 1.24 mg/dL Final  . Calcium 01/17/2020 9.7  8.9 - 10.3 mg/dL Final  . Total Protein 01/17/2020 8.0  6.5 - 8.1 g/dL Final  . Albumin 01/17/2020 3.4* 3.5 - 5.0 g/dL Final  . AST 01/17/2020 31  15 - 41 U/L Final  . ALT 01/17/2020 17  0 - 44 U/L Final  . Alkaline Phosphatase 01/17/2020 77  38 - 126 U/L Final  . Total Bilirubin 01/17/2020 0.7  0.3 - 1.2 mg/dL Final  . GFR calc non Af Amer 01/17/2020 >60  >60 mL/min Final  . GFR calc Af Amer 01/17/2020 >60  >60 mL/min Final  . Anion gap 01/17/2020 10  5 - 15 Final   Performed at [facility], [address] [place], [place] [address]  . WBC 01/17/2020 3.6* 4.0 - 10.5 K/uL Final  . RBC 01/17/2020 3.18* 4.22 - 5.81 MIL/uL Final  . Hemoglobin 01/17/2020 11.4* 13.0 - 17.0 g/dL Final  . HCT 01/17/2020 33.7* 39.0 - 52.0 % Final  . MCV 01/17/2020 106.0* 80.0 - 100.0 fL Final  . MCH 01/17/2020 35.8* 26.0 - 34.0 pg Final  . MCHC 01/17/2020 33.8  30.0 - 36.0 g/dL Final  . RDW 01/17/2020 14.8  11.5 - 15.5 % Final  . Platelets 01/17/2020 120* 150 - 400 K/uL Final  . nRBC 01/17/2020 0.0  0.0 - 0.2 % Final  . Neutrophils Relative % 01/17/2020 48  % Final  . Neutro Abs 01/17/2020 1.7  1.7 - 7.7 K/uL Final  . Lymphocytes Relative 01/17/2020 32  % Final  . Lymphs Abs 01/17/2020 1.1  0.7 -  4.0 K/uL Final  . Monocytes Relative 01/17/2020 12  % Final  . Monocytes Absolute 01/17/2020 0.4  0.1 - 1.0 K/uL Final  . Eosinophils Relative 01/17/2020 6  % Final  .  Eosinophils Absolute 01/17/2020 0.2  0.0 - 0.5 K/uL Final  . Basophils Relative 01/17/2020 2  % Final  . Basophils Absolute 01/17/2020 0.1  0.0 - 0.1 K/uL Final  . Immature Granulocytes 01/17/2020 0  % Final  . Abs Immature Granulocytes 01/17/2020 0.01  0.00 - 0.07 K/uL Final   Performed at [facility], [address], [place], [place] [address]    Assessment:  Luis [name]. is a 74 y.o. male with stage III multiple myeloma. He presented in 02/2013 with left-sided sharp pain. Evaluation revealed wide-spread lytic lesions including fracture of the left 5thrib laterally. CT scans on 02/22/2013 showed innumerable lytic lesions in thoracic spine, sternum, clavicle, scapula, and ribs.   Bone marrow biopsyon 03/13/2013 revealed 15 to 20% plasma cells. Iron stores were absent. Renal function was normal. SPEP on 03/01/2013 revealed a 1.4 gm/dL IgG monoclonal lambda. IgG was 1987.   He began induction with Revlimid25 mg a day (3 weeks on and 1 week off) and Decadron (40 mg once a week) on 04/09/2013. SPEPhas revealed no monoclonal protein 07/26/2014-06/22/2019.M-spike was 0.3 gm/dL on 04/12/2019,0.5 on 08/15/2019, 0 on 09/12/2019, 0 on 10/29/2019, and 0 on 11/22/2019.IgGwas 1362 on 07/26/2014 and 1097 on 12/19/2014.  Lambda free light chainshave been monitored: 33.20 (ratio of 1.56) on 09/20/2014, 31.51 (ratio of 1.43) on 12/19/2014, 30.25 (ratio of 1.56) on 05/01/2015, 31.95 (ratio 1.49) on 01/01/2016, 28.02 (ratio 1.29) on 03/25/2016, 31.5 (ratio 1.26) on 05/28/2016, 21.8 (ratio 1.35) on 08/20/2016, 37.3 (ratio 1.09) on 10/22/2016, 46.3 (ratio 1.49) on 10/21/2017, 54 (ratio 1.84) on 04/14/2018, 66.9 (ratio 1.64) on 11/13/2018, 87.4 (ratio 1.54) on 02/20/2019, 95.2 (ratio 1.62) on 05/17/2019, 69.8 (ratio 1.59) on 09/12/2019.  24 hour UPEPon 12/02/2015 revealed no monoclonal protein.  With remission, Revlimidwas decreased to 10 mg a day then to 10 mg every  other day secondary to issues with renal function and diarrhea. Current Revlimid is 2.5 mg a day (3 weeks on/1 week off). Last cycle of Revlimid started on 12/10/2019.  Bone surveyon 05/30/2015 revealed the majority of small lytic lesions were stable. There was possibly new lesion in the distal left clavicle and right mid femur (small) and a possible developing lucency in the proximal left femoral shaft. The calvarial lesion noted on the prior study was not well seen (? positional). Bone surveyon 11/27/2015 revealed widespread bony lytic lesions consistent with multiple myeloma. The vast majority of lesions were stable. A few small lesions were not previously seen. One lesion was previously obscured by bowel contents. Two small lesions in the right distal femoral diaphysis appeared new. Bone surveyon 05/25/2016 revealed no evidence of progressive myelomatous involvement of the skeleton. Bone surveyon 05/16/2017 revealed multiple lucent lesions as previously seen. There was no convincing evidence of progression or superimposed abnormality since prior study. Bone surveyon 06/01/2018 revealed scattered lucent lesions, with no evidence for progression.Bonesurveyon 09/22/2020was stable with stable bony lucencies consistent with patient's known myeloma.There were no new lesions.  Lumbar spineMRIwithout contrast on 04/06/2019 revealed acute to subacute compression fracture at T11 affecting the superior endplate region with a fluid-filled cavity.There was considerable bone marrow edema. Thiswas favored to represent a benign fracture. However, in this clinical setting, that cannot be stated with absolute certainty. No evidence of other myeloma foci in the lower  thoracic or lumbar region. There is a 3 cm cystic focus within the left iliac bone most consistent with a previous treated myelomafocus.There was multifactorial spinal stenosis at L4-5 with potential for neural compression on either  or both sides, somewhat worse on the right.  He received Zometaevery 3 months (last 02/28/2017). He takes calcium 4 pills/day. He receives B12every 4 weeks (last 01/03/2020). B12 was 370 on 01/17/2015 and 493 on 10/13/2018. Folatewas 17.5 on 09/17/2016 and 35 on 10/13/2018, 15.1 on 12/06/19. TSH was normal on 07/07/2018.  Abdominal and pelvic CT scanon 07/15/2015 revealed extensive sigmoid diverticulosis. There was questionable wall thickening of the ascending colon versus artifact from incomplete distention. Colonoscopyon 11/11/2015 revealed diverticulosis in the sigmoid colon and distal descending colon and ascending colon. There was a 2 mm polyp in the mid sigmoid colon. Pathology revealed a hyperplastic polyp, negative for dysplasia or malignancy.  Symptomatically, he is feeling a little better.  He denies any bone pain or interval infections.  Plan: 1.   Labs today: CBC with diff, CMP, SPEP. 2.Multiple myeloma Clinically, stable. Bone survey on 07/31/2019 was stable. M spike was 0 on 01/17/2020. Kappa lambda light chain ratio was normal on 09/12/2019. Ballinger 1700 today. Last cycle of Revlimid postponed due to decreased counts Received Revlimid 12/10/2019-12/31/2019 Patient has now had 3 weeks off.  Counts have recovered.  Per patient, he starts on Mondays.  Plan to start on 01/21/2020 with 3 weeks on and 1 week off then and see Dr. Mike Gip for follow-up for evaluation and consideration of next cycle. 3.Macrocytic anemia Hemoglobin 11.4 (decreased), hematocrit 33.7 (decreased), MCV 106 (elevated), platelets 120 (decreased), WBC 3.6 (decreased) Possible underlying myelodysplastic syndrome vs secondary to alcohol intake Continue to monitor. 4.B12 deficiency May be secondary to medications vs EtOH intake  Last B12 in February with plan for total of 6 doses then plan to recheck labs  Folate was 15.1 in January 21  B12 was 493 in December 2019. Plan to recheck at  next visit  Next B12 due around 01/31/2020 5.Renal insufficiency Creatininecontinues to fluctuate(1.08 - 1.52). Creatinine  1.08 today.  Stable. Continue to monitor. 6.Weight loss Weightloss was substantial following the loss of his wife.             Weight is down 1 pound Encourage caloric intake.             Continue nutritional supplements such as boost 7. Grief/Depression  Encourage patient to discuss possible medication management and grief counseling with his PCP.  He declined both today  Disposition: Start next cycle of Revlimid on 01/21/2020 Return to clinic on 4/12 for labs and reevaluation with Dr. Mike Gip Plan for B12 injection on 01/31/2020 as scheduled  I discussed the assessment and treatment plan with the patient.  The patient was provided an opportunity to ask questions and all were answered.  The patient agreed with the plan and demonstrated an understanding of the instructions.  The patient was advised to call back if the symptoms worsen or if the condition fails to improve as anticipated.  Beckey Rutter, DNP, AGNP-C [place] at [facility] [phone] (clinic)  CC: Dr. Mike Gip

## 2020-01-18 LAB — PROTEIN ELECTROPHORESIS, SERUM
A/G Ratio: 0.8 (ref 0.7–1.7)
Albumin ELP: 3.3 g/dL (ref 2.9–4.4)
Alpha-1-Globulin: 0.2 g/dL (ref 0.0–0.4)
Alpha-2-Globulin: 0.7 g/dL (ref 0.4–1.0)
Beta Globulin: 1.5 g/dL — ABNORMAL HIGH (ref 0.7–1.3)
Gamma Globulin: 1.6 g/dL (ref 0.4–1.8)
Globulin, Total: 4 g/dL — ABNORMAL HIGH (ref 2.2–3.9)
Total Protein ELP: 7.3 g/dL (ref 6.0–8.5)

## 2020-01-23 ENCOUNTER — Other Ambulatory Visit: Payer: Self-pay | Admitting: Hematology and Oncology

## 2020-01-23 ENCOUNTER — Other Ambulatory Visit: Payer: Self-pay

## 2020-01-23 DIAGNOSIS — C9000 Multiple myeloma not having achieved remission: Secondary | ICD-10-CM

## 2020-01-23 MED ORDER — LENALIDOMIDE 2.5 MG PO CAPS: 2.5000 mg | ORAL_CAPSULE | Freq: Every day | ORAL | 0 refills | Status: DC

## 2020-01-31 ENCOUNTER — Inpatient Hospital Stay: Payer: Medicare Other

## 2020-01-31 VITALS — BP 144/81 | HR 54 | Resp 18

## 2020-01-31 DIAGNOSIS — C9000 Multiple myeloma not having achieved remission: Secondary | ICD-10-CM | POA: Diagnosis not present

## 2020-01-31 DIAGNOSIS — E538 Deficiency of other specified B group vitamins: Secondary | ICD-10-CM

## 2020-01-31 MED ORDER — CYANOCOBALAMIN 1000 MCG/ML IJ SOLN
1000.0000 ug | Freq: Once | INTRAMUSCULAR | Status: AC
  Administered 2020-01-31: 1000 ug via INTRAMUSCULAR

## 2020-01-31 NOTE — Patient Instructions (Signed)
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
## 2020-02-15 ENCOUNTER — Other Ambulatory Visit: Payer: Self-pay

## 2020-02-15 ENCOUNTER — Encounter: Payer: Self-pay | Admitting: Hematology and Oncology

## 2020-02-15 NOTE — Progress Notes (Signed)
RN called pt verified DOB and name.  Pt verbalized understanding of appt on Monday April 12 @ 115pm.  Reviewed med list, pt stated last dose of Revlimid was "Easter Sunday".

## 2020-02-18 ENCOUNTER — Inpatient Hospital Stay: Payer: Medicare Other

## 2020-02-18 ENCOUNTER — Encounter: Payer: Self-pay | Admitting: Hematology and Oncology

## 2020-02-18 ENCOUNTER — Other Ambulatory Visit: Payer: Self-pay

## 2020-02-18 ENCOUNTER — Inpatient Hospital Stay: Payer: Medicare Other | Attending: Hematology and Oncology | Admitting: Hematology and Oncology

## 2020-02-18 VITALS — BP 96/59 | HR 55 | Temp 97.4°F | Resp 20 | Wt 192.1 lb

## 2020-02-18 DIAGNOSIS — N289 Disorder of kidney and ureter, unspecified: Secondary | ICD-10-CM | POA: Diagnosis not present

## 2020-02-18 DIAGNOSIS — D539 Nutritional anemia, unspecified: Secondary | ICD-10-CM | POA: Diagnosis not present

## 2020-02-18 DIAGNOSIS — R634 Abnormal weight loss: Secondary | ICD-10-CM

## 2020-02-18 DIAGNOSIS — C9000 Multiple myeloma not having achieved remission: Secondary | ICD-10-CM

## 2020-02-18 DIAGNOSIS — E538 Deficiency of other specified B group vitamins: Secondary | ICD-10-CM | POA: Diagnosis not present

## 2020-02-18 DIAGNOSIS — Z79899 Other long term (current) drug therapy: Secondary | ICD-10-CM | POA: Insufficient documentation

## 2020-02-18 LAB — COMPREHENSIVE METABOLIC PANEL
ALT: 15 U/L (ref 0–44)
AST: 30 U/L (ref 15–41)
Albumin: 3.2 g/dL — ABNORMAL LOW (ref 3.5–5.0)
Alkaline Phosphatase: 67 U/L (ref 38–126)
Anion gap: 9 (ref 5–15)
BUN: 19 mg/dL (ref 8–23)
CO2: 20 mmol/L — ABNORMAL LOW (ref 22–32)
Calcium: 9 mg/dL (ref 8.9–10.3)
Chloride: 109 mmol/L (ref 98–111)
Creatinine, Ser: 1.04 mg/dL (ref 0.61–1.24)
GFR calc Af Amer: >60 mL/min (ref >60)
GFR calc non Af Amer: >60 mL/min (ref >60)
Glucose, Bld: 184 mg/dL — ABNORMAL HIGH (ref 70–99)
Potassium: 4.2 mmol/L (ref 3.5–5.1)
Sodium: 138 mmol/L (ref 135–145)
Total Bilirubin: 1.1 mg/dL (ref 0.3–1.2)
Total Protein: 7.9 g/dL (ref 6.5–8.1)

## 2020-02-18 LAB — CBC WITH DIFFERENTIAL/PLATELET
Abs Immature Granulocytes: 0.01 10*3/uL (ref 0.00–0.07)
Basophils Absolute: 0.1 10*3/uL (ref 0.0–0.1)
Basophils Relative: 3 %
Eosinophils Absolute: 0.2 10*3/uL (ref 0.0–0.5)
Eosinophils Relative: 5 %
HCT: 34.7 % — ABNORMAL LOW (ref 39.0–52.0)
Hemoglobin: 11.9 g/dL — ABNORMAL LOW (ref 13.0–17.0)
Immature Granulocytes: 0 %
Lymphocytes Relative: 31 %
Lymphs Abs: 1.1 10*3/uL (ref 0.7–4.0)
MCH: 36.3 pg — ABNORMAL HIGH (ref 26.0–34.0)
MCHC: 34.3 g/dL (ref 30.0–36.0)
MCV: 105.8 fL — ABNORMAL HIGH (ref 80.0–100.0)
Monocytes Absolute: 0.6 10*3/uL (ref 0.1–1.0)
Monocytes Relative: 17 %
Neutro Abs: 1.6 10*3/uL — ABNORMAL LOW (ref 1.7–7.7)
Neutrophils Relative %: 44 %
Platelets: 134 10*3/uL — ABNORMAL LOW (ref 150–400)
RBC: 3.28 MIL/uL — ABNORMAL LOW (ref 4.22–5.81)
RDW: 13.6 % (ref 11.5–15.5)
WBC: 3.6 10*3/uL — ABNORMAL LOW (ref 4.0–10.5)
nRBC: 0 % (ref 0.0–0.2)

## 2020-02-18 LAB — VITAMIN B12: Vitamin B-12: 483 pg/mL (ref 180–914)

## 2020-02-20 LAB — PROTEIN ELECTROPHORESIS, SERUM
A/G Ratio: 0.7 (ref 0.7–1.7)
Albumin ELP: 3.1 g/dL (ref 2.9–4.4)
Alpha-1-Globulin: 0.2 g/dL (ref 0.0–0.4)
Alpha-2-Globulin: 0.7 g/dL (ref 0.4–1.0)
Beta Globulin: 1.7 g/dL — ABNORMAL HIGH (ref 0.7–1.3)
Gamma Globulin: 1.6 g/dL (ref 0.4–1.8)
Globulin, Total: 4.2 g/dL — ABNORMAL HIGH (ref 2.2–3.9)
Total Protein ELP: 7.3 g/dL (ref 6.0–8.5)

## 2020-02-26 ENCOUNTER — Other Ambulatory Visit: Payer: Self-pay | Admitting: Hematology and Oncology

## 2020-02-26 DIAGNOSIS — C9000 Multiple myeloma not having achieved remission: Secondary | ICD-10-CM

## 2020-02-28 ENCOUNTER — Inpatient Hospital Stay: Payer: Medicare Other

## 2020-02-28 ENCOUNTER — Other Ambulatory Visit: Payer: Self-pay

## 2020-02-28 VITALS — BP 155/79 | HR 55 | Temp 96.9°F | Resp 18

## 2020-02-28 DIAGNOSIS — E538 Deficiency of other specified B group vitamins: Secondary | ICD-10-CM

## 2020-02-28 MED ORDER — CYANOCOBALAMIN 1000 MCG/ML IJ SOLN
1000.0000 ug | Freq: Once | INTRAMUSCULAR | Status: AC
  Administered 2020-02-28: 1000 ug via INTRAMUSCULAR

## 2020-02-28 NOTE — Patient Instructions (Signed)

## 2020-02-29 IMAGING — CR DG CHEST 2V
3 series · 3 of 3 positions shown · non-contrast
Comparison: 08/08/2013, 07/31/2019

CLINICAL DATA: 72-year-old male with a history of cough and
shortness of breath

EXAM:
CHEST - 2 VIEW

[chest pa (1 of 2)]
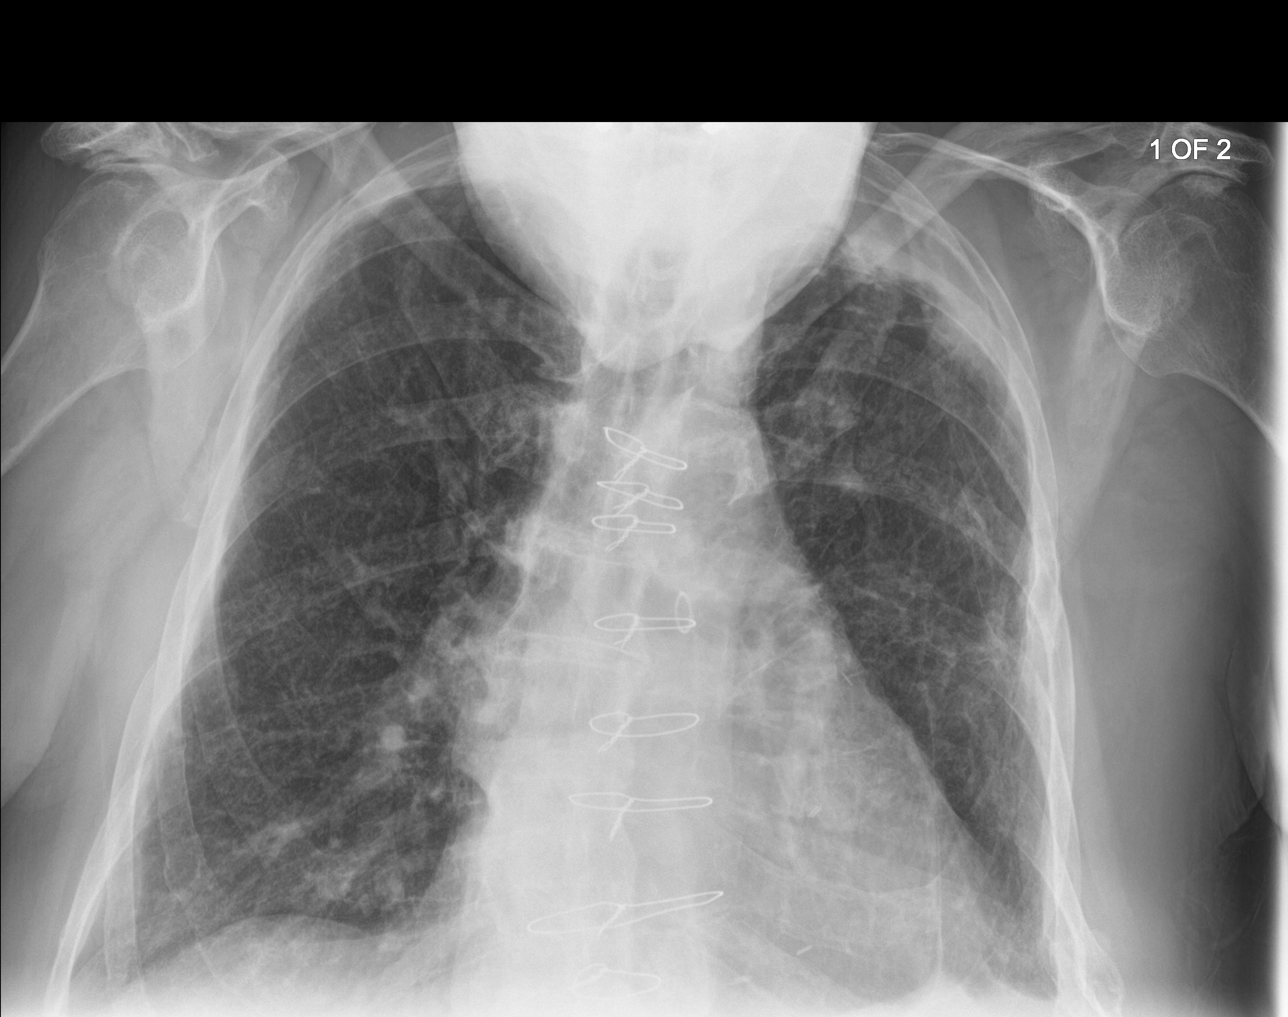

[chest lat]
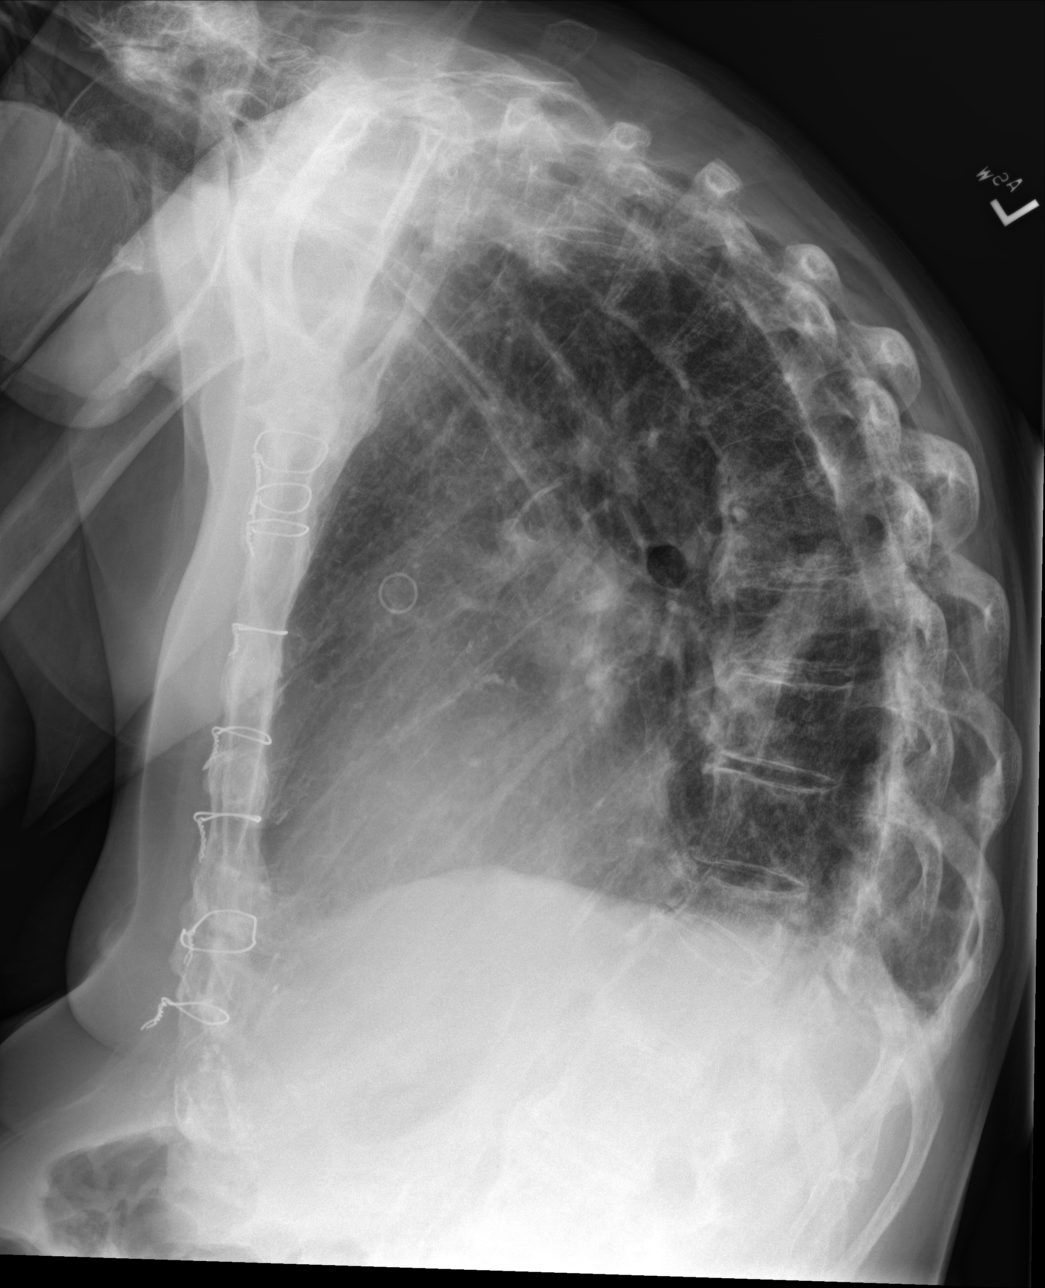

[chest pa (2 of 2)]
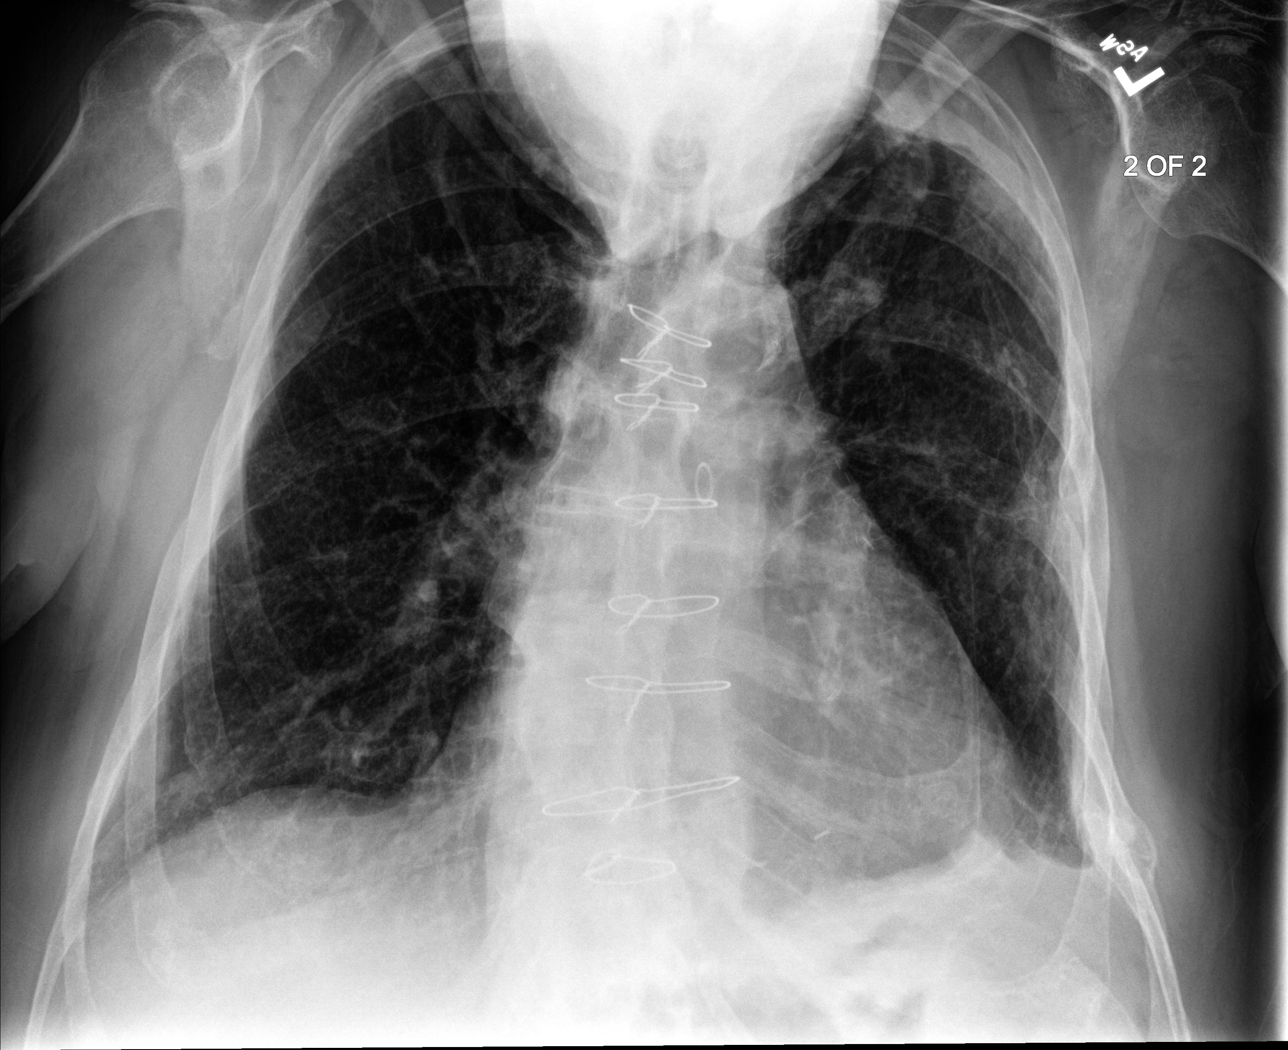

[3 of 3 positions shown; findings below may reference images not displayed]

FINDINGS: Cardiomediastinal silhouette unchanged in size and contour. Surgical
changes of median sternotomy and CABG.

No pneumothorax. Blunting of the bilateral costophrenic angles with
left greater than right pleuroparenchymal thickening at the apices.
Reticulonodular opacities of the bilateral lungs, similar to the
recent plain film of the chest.

Subacute/partially healed left-sided rib fractures again noted.

No pneumothorax or pleural effusion. No confluent airspace disease.
Stigmata of emphysema, with increased retrosternal airspace,
flattened hemidiaphragms, increased AP diameter, and hyperinflation
on the AP view.

Lower thoracic compression fracture was present on the bone survey
of 07/31/2019, though was not present 06/01/2018.
IMPRESSION: Chronic lung changes, including left pleural thickening and apical
pleuroparenchymal scarring, with no definite evidence of acute
cardiopulmonary disease.

Lower thoracic vertebral compression fracture is new from
06/01/2018, similar in comparison to the bone survey 07/31/2019. If
there is concern for any symptoms at this site MRI would be the test
of choice to identify any bone edema/subacute fracture.

## 2020-03-03 ENCOUNTER — Ambulatory Visit: Payer: Medicare Other | Admitting: Dermatology

## 2020-03-10 ENCOUNTER — Other Ambulatory Visit: Payer: Self-pay | Admitting: *Deleted

## 2020-03-10 DIAGNOSIS — C9000 Multiple myeloma not having achieved remission: Secondary | ICD-10-CM

## 2020-03-27 ENCOUNTER — Other Ambulatory Visit: Payer: Self-pay

## 2020-03-27 ENCOUNTER — Inpatient Hospital Stay: Payer: Medicare Other
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
## 2020-03-27 NOTE — Progress Notes (Signed)
Patient pre screened for office appointment, no questions or concerns today. Patient reminded of upcoming appointment time and date. 

## 2020-03-28 ENCOUNTER — Inpatient Hospital Stay: Payer: Medicare Other

## 2020-03-28 ENCOUNTER — Telehealth: Payer: Self-pay | Admitting: *Deleted

## 2020-03-28 ENCOUNTER — Inpatient Hospital Stay: Payer: Medicare Other | Attending: Hematology and Oncology | Admitting: Hematology and Oncology

## 2020-03-28 VITALS — BP 126/43 | HR 54 | Temp 98.6°F | Resp 18 | Wt 192.7 lb

## 2020-03-28 DIAGNOSIS — D539 Nutritional anemia, unspecified: Secondary | ICD-10-CM | POA: Diagnosis not present

## 2020-03-28 DIAGNOSIS — N289 Disorder of kidney and ureter, unspecified: Secondary | ICD-10-CM

## 2020-03-28 DIAGNOSIS — R634 Abnormal weight loss: Secondary | ICD-10-CM

## 2020-03-28 DIAGNOSIS — E538 Deficiency of other specified B group vitamins: Secondary | ICD-10-CM

## 2020-03-28 DIAGNOSIS — C9000 Multiple myeloma not having achieved remission: Secondary | ICD-10-CM

## 2020-03-28 LAB — CBC WITH DIFFERENTIAL/PLATELET
Abs Immature Granulocytes: 0.01 10*3/uL (ref 0.00–0.07)
Basophils Absolute: 0.1 10*3/uL (ref 0.0–0.1)
Basophils Relative: 4 %
Eosinophils Absolute: 0.2 10*3/uL (ref 0.0–0.5)
Eosinophils Relative: 7 %
HCT: 33.7 % — ABNORMAL LOW (ref 39.0–52.0)
Hemoglobin: 11.5 g/dL — ABNORMAL LOW (ref 13.0–17.0)
Immature Granulocytes: 0 %
Lymphocytes Relative: 37 %
Lymphs Abs: 1 10*3/uL (ref 0.7–4.0)
MCH: 36.3 pg — ABNORMAL HIGH (ref 26.0–34.0)
MCHC: 34.1 g/dL (ref 30.0–36.0)
MCV: 106.3 fL — ABNORMAL HIGH (ref 80.0–100.0)
Monocytes Absolute: 0.4 10*3/uL (ref 0.1–1.0)
Monocytes Relative: 17 %
Neutro Abs: 0.9 10*3/uL — ABNORMAL LOW (ref 1.7–7.7)
Neutrophils Relative %: 35 %
Platelets: 123 10*3/uL — ABNORMAL LOW (ref 150–400)
RBC: 3.17 MIL/uL — ABNORMAL LOW (ref 4.22–5.81)
RDW: 13.6 % (ref 11.5–15.5)
WBC: 2.6 10*3/uL — ABNORMAL LOW (ref 4.0–10.5)
nRBC: 0 % (ref 0.0–0.2)

## 2020-03-28 LAB — COMPREHENSIVE METABOLIC PANEL
ALT: 15 U/L (ref 0–44)
AST: 31 U/L (ref 15–41)
Albumin: 3.4 g/dL — ABNORMAL LOW (ref 3.5–5.0)
Alkaline Phosphatase: 67 U/L (ref 38–126)
Anion gap: 8 (ref 5–15)
BUN: 23 mg/dL (ref 8–23)
CO2: 20 mmol/L — ABNORMAL LOW (ref 22–32)
Calcium: 9.6 mg/dL (ref 8.9–10.3)
Chloride: 107 mmol/L (ref 98–111)
Creatinine, Ser: 1.02 mg/dL (ref 0.61–1.24)
GFR calc Af Amer: >60 mL/min (ref >60)
GFR calc non Af Amer: >60 mL/min (ref >60)
Glucose, Bld: 166 mg/dL — ABNORMAL HIGH (ref 70–99)
Potassium: 5 mmol/L (ref 3.5–5.1)
Sodium: 135 mmol/L (ref 135–145)
Total Bilirubin: 0.9 mg/dL (ref 0.3–1.2)
Total Protein: 8.1 g/dL (ref 6.5–8.1)

## 2020-03-28 MED ORDER — CYANOCOBALAMIN 1000 MCG/ML IJ SOLN
1000.0000 ug | Freq: Once | INTRAMUSCULAR | Status: AC
  Administered 2020-03-28: 1000 ug via INTRAMUSCULAR
  Filled 2020-03-28: qty 1

## 2020-03-28 NOTE — Progress Notes (Signed)
Patient here today for follow up of Multiple Myeloma. Patient states that he has been feeling down lately and he has an aversion to food. He states that his wife of 75 years passed away suddenly last October 21, 2023 from a brain aneurism, and he is having difficulty dealing with being alone. They were very close, and cooked all of their meals together. Since she passed, he has had trouble eating, as nothing tastes good, and he doesn't want to cook for himself.

## 2020-03-28 NOTE — Telephone Encounter (Signed)
Left patient a voice mail message with information regarding free Bereavement Counseling through [facility]osp[facility] and information about psychiatrist, Cephus Shelling, along with phone numbers.

## 2020-03-31 LAB — PROTEIN ELECTROPHORESIS, SERUM
A/G Ratio: 0.7 (ref 0.7–1.7)
Albumin ELP: 3.2 g/dL (ref 2.9–4.4)
Alpha-1-Globulin: 0.2 g/dL (ref 0.0–0.4)
Alpha-2-Globulin: 0.7 g/dL (ref 0.4–1.0)
Beta Globulin: 1.5 g/dL — ABNORMAL HIGH (ref 0.7–1.3)
Gamma Globulin: 2 g/dL — ABNORMAL HIGH (ref 0.4–1.8)
Globulin, Total: 4.4 g/dL — ABNORMAL HIGH (ref 2.2–3.9)
Total Protein ELP: 7.6 g/dL (ref 6.0–8.5)

## 2020-04-02 ENCOUNTER — Other Ambulatory Visit: Payer: Self-pay | Admitting: Hematology and Oncology

## 2020-04-02 DIAGNOSIS — C9000 Multiple myeloma not having achieved remission: Secondary | ICD-10-CM

## 2020-04-08 ENCOUNTER — Inpatient Hospital Stay: Payer: Medicare Other | Attending: Hematology and Oncology

## 2020-04-08 ENCOUNTER — Other Ambulatory Visit: Payer: Self-pay

## 2020-04-08 DIAGNOSIS — E538 Deficiency of other specified B group vitamins: Secondary | ICD-10-CM | POA: Diagnosis not present

## 2020-04-08 DIAGNOSIS — Z79899 Other long term (current) drug therapy: Secondary | ICD-10-CM | POA: Insufficient documentation

## 2020-04-08 DIAGNOSIS — D539 Nutritional anemia, unspecified: Secondary | ICD-10-CM

## 2020-04-08 DIAGNOSIS — C9000 Multiple myeloma not having achieved remission: Secondary | ICD-10-CM

## 2020-04-08 DIAGNOSIS — R634 Abnormal weight loss: Secondary | ICD-10-CM

## 2020-04-08 LAB — CBC WITH DIFFERENTIAL/PLATELET
Abs Immature Granulocytes: 0.02 10*3/uL (ref 0.00–0.07)
Basophils Absolute: 0.1 10*3/uL (ref 0.0–0.1)
Basophils Relative: 1 %
Eosinophils Absolute: 0.3 10*3/uL (ref 0.0–0.5)
Eosinophils Relative: 7 %
HCT: 33.5 % — ABNORMAL LOW (ref 39.0–52.0)
Hemoglobin: 11.3 g/dL — ABNORMAL LOW (ref 13.0–17.0)
Immature Granulocytes: 1 %
Lymphocytes Relative: 32 %
Lymphs Abs: 1.3 10*3/uL (ref 0.7–4.0)
MCH: 36.1 pg — ABNORMAL HIGH (ref 26.0–34.0)
MCHC: 33.7 g/dL (ref 30.0–36.0)
MCV: 107 fL — ABNORMAL HIGH (ref 80.0–100.0)
Monocytes Absolute: 0.5 10*3/uL (ref 0.1–1.0)
Monocytes Relative: 13 %
Neutro Abs: 1.9 10*3/uL (ref 1.7–7.7)
Neutrophils Relative %: 46 %
Platelets: 128 10*3/uL — ABNORMAL LOW (ref 150–400)
RBC: 3.13 MIL/uL — ABNORMAL LOW (ref 4.22–5.81)
RDW: 13.9 % (ref 11.5–15.5)
WBC: 4 10*3/uL (ref 4.0–10.5)
nRBC: 0 % (ref 0.0–0.2)

## 2020-04-11 ENCOUNTER — Telehealth: Payer: Self-pay | Admitting: *Deleted

## 2020-04-11 NOTE — Telephone Encounter (Signed)
Patient called asking for labs results and asking when he is to restart his Revlimid. Please advise  CBC with Differential Order: [id number] Status:  Final result Visible to patient:  Yes (MyChart) Next appt:  04/24/2020 at 02:15 PM in Oncology ([facility] INJECTION) Dx:  Weight loss; Multiple myeloma not hav...  Ref Range & Units 3 d ago 2 wk ago 1 mo ago  WBC 4.0 - 10.5 K/uL 4.0  2.6Low   3.6Low    RBC 4.22 - 5.81 MIL/uL 3.13Low   3.17Low   3.28Low    Hemoglobin 13.0 - 17.0 g/dL 11.3Low   11.5Low   11.9Low    HCT 39.0 - 52.0 % 33.5Low   33.7Low   34.7Low    MCV 80.0 - 100.0 fL 107.0High   106.3High   105.8High    MCH 26.0 - 34.0 pg 36.1High   36.3High   36.3High    MCHC 30.0 - 36.0 g/dL 33.7  34.1  34.3   RDW 11.5 - 15.5 % 13.9  13.6  13.6   Platelets 150 - 400 K/uL 128Low   123Low   134Low    nRBC 0.0 - 0.2 % 0.0  0.0  0.0   Neutrophils Relative % % 46  35  44   Neutro Abs 1.7 - 7.7 K/uL 1.9  0.9Low   1.6Low    Lymphocytes Relative % 32  37  31   Lymphs Abs 0.7 - 4.0 K/uL 1.3  1.0  1.1   Monocytes Relative % '13  17  17   ' Monocytes Absolute 0.1 - 1.0 K/uL 0.5  0.4  0.6   Eosinophils Relative % '7  7  5   ' Eosinophils Absolute 0.0 - 0.5 K/uL 0.3  0.2  0.2   Basophils Relative % '1  4  3   ' Basophils Absolute 0.0 - 0.1 K/uL 0.1  0.1  0.1   Immature Granulocytes % 1  0  0   Abs Immature Granulocytes 0.00 - 0.07 K/uL 0.02  0.01 CM  0.01 CM   Comment: Performed at [facility], [address], [place], [place] [address]  WBC Morphology   REVIEWED    Resulting Agency  [facility] [place] [facility]      Specimen Collected: 04/08/20 13:33 Last Resulted: 04/08/20 13:42

## 2020-04-14 ENCOUNTER — Telehealth: Payer: Self-pay

## 2020-04-14 NOTE — Telephone Encounter (Signed)
Received fax request for Revlimid prescription from [facility]. Attempted to get auth/REMS # from [employer] online, but it did not allow since patient survey has not been completed. Reached out to patient and he states that Revlimid was on hold for approx 3 weeks and since he is restarting today he still has enought for this cycle. Pt will call [employer] to notify them about the hold time and will fill out prescriber survey prior to next cycle.

## 2020-04-14 NOTE — Telephone Encounter (Signed)
Dr. Mike Gip reviewed labs and pt informed that he may restart Revlimid.

## 2020-04-24 ENCOUNTER — Other Ambulatory Visit: Payer: Self-pay

## 2020-04-24 ENCOUNTER — Inpatient Hospital Stay: Payer: Medicare Other

## 2020-04-24 VITALS — BP 138/62 | HR 60 | Temp 98.4°F | Resp 18

## 2020-04-24 DIAGNOSIS — E538 Deficiency of other specified B group vitamins: Secondary | ICD-10-CM

## 2020-04-24 MED ORDER — CYANOCOBALAMIN 1000 MCG/ML IJ SOLN
1000.0000 ug | Freq: Once | INTRAMUSCULAR | Status: AC
  Administered 2020-04-24: 1000 ug via INTRAMUSCULAR
  Filled 2020-04-24: qty 1

## 2020-05-22 ENCOUNTER — Other Ambulatory Visit: Payer: Self-pay

## 2020-05-22 ENCOUNTER — Inpatient Hospital Stay (HOSPITAL_BASED_OUTPATIENT_CLINIC_OR_DEPARTMENT_OTHER): Payer: Medicare Other | Admitting: Nurse Practitioner

## 2020-05-22 ENCOUNTER — Encounter: Payer: Self-pay | Admitting: Nurse Practitioner

## 2020-05-22 ENCOUNTER — Inpatient Hospital Stay: Payer: Medicare Other

## 2020-05-22 ENCOUNTER — Inpatient Hospital Stay: Payer: Medicare Other | Admitting: Nurse Practitioner

## 2020-05-22 ENCOUNTER — Inpatient Hospital Stay: Payer: Medicare Other | Attending: Hematology and Oncology

## 2020-05-22 VITALS — BP 134/53 | HR 59 | Temp 97.6°F | Wt 189.6 lb

## 2020-05-22 DIAGNOSIS — F4321 Adjustment disorder with depressed mood: Secondary | ICD-10-CM | POA: Diagnosis not present

## 2020-05-22 DIAGNOSIS — R634 Abnormal weight loss: Secondary | ICD-10-CM | POA: Insufficient documentation

## 2020-05-22 DIAGNOSIS — R05 Cough: Secondary | ICD-10-CM | POA: Insufficient documentation

## 2020-05-22 DIAGNOSIS — K219 Gastro-esophageal reflux disease without esophagitis: Secondary | ICD-10-CM | POA: Insufficient documentation

## 2020-05-22 DIAGNOSIS — N289 Disorder of kidney and ureter, unspecified: Secondary | ICD-10-CM | POA: Diagnosis not present

## 2020-05-22 DIAGNOSIS — K5730 Diverticulosis of large intestine without perforation or abscess without bleeding: Secondary | ICD-10-CM | POA: Insufficient documentation

## 2020-05-22 DIAGNOSIS — D539 Nutritional anemia, unspecified: Secondary | ICD-10-CM

## 2020-05-22 DIAGNOSIS — I509 Heart failure, unspecified: Secondary | ICD-10-CM | POA: Diagnosis not present

## 2020-05-22 DIAGNOSIS — E785 Hyperlipidemia, unspecified: Secondary | ICD-10-CM | POA: Insufficient documentation

## 2020-05-22 DIAGNOSIS — Z7984 Long term (current) use of oral hypoglycemic drugs: Secondary | ICD-10-CM | POA: Diagnosis not present

## 2020-05-22 DIAGNOSIS — I10 Essential (primary) hypertension: Secondary | ICD-10-CM | POA: Insufficient documentation

## 2020-05-22 DIAGNOSIS — M48061 Spinal stenosis, lumbar region without neurogenic claudication: Secondary | ICD-10-CM | POA: Insufficient documentation

## 2020-05-22 DIAGNOSIS — C9000 Multiple myeloma not having achieved remission: Secondary | ICD-10-CM | POA: Insufficient documentation

## 2020-05-22 DIAGNOSIS — I252 Old myocardial infarction: Secondary | ICD-10-CM | POA: Insufficient documentation

## 2020-05-22 DIAGNOSIS — F329 Major depressive disorder, single episode, unspecified: Secondary | ICD-10-CM | POA: Insufficient documentation

## 2020-05-22 DIAGNOSIS — F17210 Nicotine dependence, cigarettes, uncomplicated: Secondary | ICD-10-CM | POA: Insufficient documentation

## 2020-05-22 DIAGNOSIS — I2510 Atherosclerotic heart disease of native coronary artery without angina pectoris: Secondary | ICD-10-CM | POA: Insufficient documentation

## 2020-05-22 DIAGNOSIS — E538 Deficiency of other specified B group vitamins: Secondary | ICD-10-CM

## 2020-05-22 DIAGNOSIS — Z79899 Other long term (current) drug therapy: Secondary | ICD-10-CM | POA: Insufficient documentation

## 2020-05-22 DIAGNOSIS — D519 Vitamin B12 deficiency anemia, unspecified: Secondary | ICD-10-CM | POA: Diagnosis not present

## 2020-05-22 DIAGNOSIS — Z7982 Long term (current) use of aspirin: Secondary | ICD-10-CM | POA: Insufficient documentation

## 2020-05-22 DIAGNOSIS — G4730 Sleep apnea, unspecified: Secondary | ICD-10-CM | POA: Insufficient documentation

## 2020-05-22 DIAGNOSIS — F418 Other specified anxiety disorders: Secondary | ICD-10-CM | POA: Diagnosis not present

## 2020-05-22 DIAGNOSIS — E119 Type 2 diabetes mellitus without complications: Secondary | ICD-10-CM | POA: Diagnosis not present

## 2020-05-22 DIAGNOSIS — Z86010 Personal history of colonic polyps: Secondary | ICD-10-CM | POA: Insufficient documentation

## 2020-05-22 DIAGNOSIS — D518 Other vitamin B12 deficiency anemias: Secondary | ICD-10-CM

## 2020-05-22 LAB — CBC WITH DIFFERENTIAL/PLATELET
Abs Immature Granulocytes: 0.02 10*3/uL (ref 0.00–0.07)
Basophils Absolute: 0.1 10*3/uL (ref 0.0–0.1)
Basophils Relative: 2 %
Eosinophils Absolute: 0.3 10*3/uL (ref 0.0–0.5)
Eosinophils Relative: 6 %
HCT: 31.6 % — ABNORMAL LOW (ref 39.0–52.0)
Hemoglobin: 11 g/dL — ABNORMAL LOW (ref 13.0–17.0)
Immature Granulocytes: 0 %
Lymphocytes Relative: 30 %
Lymphs Abs: 1.4 10*3/uL (ref 0.7–4.0)
MCH: 36.3 pg — ABNORMAL HIGH (ref 26.0–34.0)
MCHC: 34.8 g/dL (ref 30.0–36.0)
MCV: 104.3 fL — ABNORMAL HIGH (ref 80.0–100.0)
Monocytes Absolute: 0.4 10*3/uL (ref 0.1–1.0)
Monocytes Relative: 9 %
Neutro Abs: 2.4 10*3/uL (ref 1.7–7.7)
Neutrophils Relative %: 53 %
Platelets: 152 10*3/uL (ref 150–400)
RBC: 3.03 MIL/uL — ABNORMAL LOW (ref 4.22–5.81)
RDW: 13.9 % (ref 11.5–15.5)
WBC: 4.6 10*3/uL (ref 4.0–10.5)
nRBC: 0 % (ref 0.0–0.2)

## 2020-05-22 LAB — COMPREHENSIVE METABOLIC PANEL
ALT: 17 U/L (ref 0–44)
AST: 33 U/L (ref 15–41)
Albumin: 3.1 g/dL — ABNORMAL LOW (ref 3.5–5.0)
Alkaline Phosphatase: 74 U/L (ref 38–126)
Anion gap: 7 (ref 5–15)
BUN: 31 mg/dL — ABNORMAL HIGH (ref 8–23)
CO2: 23 mmol/L (ref 22–32)
Calcium: 9.5 mg/dL (ref 8.9–10.3)
Chloride: 106 mmol/L (ref 98–111)
Creatinine, Ser: 1.1 mg/dL (ref 0.61–1.24)
GFR calc Af Amer: >60 mL/min (ref >60)
GFR calc non Af Amer: >60 mL/min (ref >60)
Glucose, Bld: 128 mg/dL — ABNORMAL HIGH (ref 70–99)
Potassium: 4.4 mmol/L (ref 3.5–5.1)
Sodium: 136 mmol/L (ref 135–145)
Total Bilirubin: 1 mg/dL (ref 0.3–1.2)
Total Protein: 8.1 g/dL (ref 6.5–8.1)

## 2020-05-22 MED ORDER — CYANOCOBALAMIN 1000 MCG/ML IJ SOLN
1000.0000 ug | Freq: Once | INTRAMUSCULAR | Status: AC
  Administered 2020-05-22: 1000 ug via INTRAMUSCULAR
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
## 2020-05-22 NOTE — Progress Notes (Signed)
The patient reports SOB with coughing he has been given amoxicillin 500 mg 10 days but has completed them at this time , no appetite, very weak.

## 2020-05-23 LAB — PROTEIN ELECTROPHORESIS, SERUM
A/G Ratio: 0.6 — ABNORMAL LOW (ref 0.7–1.7)
Albumin ELP: 2.9 g/dL (ref 2.9–4.4)
Alpha-1-Globulin: 0.2 g/dL (ref 0.0–0.4)
Alpha-2-Globulin: 0.7 g/dL (ref 0.4–1.0)
Beta Globulin: 1.5 g/dL — ABNORMAL HIGH (ref 0.7–1.3)
Gamma Globulin: 2.1 g/dL — ABNORMAL HIGH (ref 0.4–1.8)
Globulin, Total: 4.5 g/dL — ABNORMAL HIGH (ref 2.2–3.9)
Total Protein ELP: 7.4 g/dL (ref 6.0–8.5)

## 2020-05-26 ENCOUNTER — Ambulatory Visit: Payer: Medicare Other | Admitting: Hematology and Oncology

## 2020-05-26 ENCOUNTER — Other Ambulatory Visit: Payer: Medicare Other

## 2020-06-03 ENCOUNTER — Other Ambulatory Visit: Payer: Self-pay | Admitting: Hematology and Oncology

## 2020-06-03 DIAGNOSIS — C9000 Multiple myeloma not having achieved remission: Secondary | ICD-10-CM

## 2020-06-04 NOTE — Telephone Encounter (Signed)
Dr. Corcoran pt 

## 2020-06-13 ENCOUNTER — Encounter: Payer: Self-pay | Admitting: Emergency Medicine

## 2020-06-13 ENCOUNTER — Other Ambulatory Visit: Payer: Self-pay

## 2020-06-13 ENCOUNTER — Emergency Department: Payer: Medicare Other

## 2020-06-13 DIAGNOSIS — Z8042 Family history of malignant neoplasm of prostate: Secondary | ICD-10-CM

## 2020-06-13 DIAGNOSIS — R0902 Hypoxemia: Secondary | ICD-10-CM | POA: Diagnosis present

## 2020-06-13 DIAGNOSIS — E785 Hyperlipidemia, unspecified: Secondary | ICD-10-CM | POA: Diagnosis present

## 2020-06-13 DIAGNOSIS — Z7984 Long term (current) use of oral hypoglycemic drugs: Secondary | ICD-10-CM

## 2020-06-13 DIAGNOSIS — Z87891 Personal history of nicotine dependence: Secondary | ICD-10-CM

## 2020-06-13 DIAGNOSIS — G4733 Obstructive sleep apnea (adult) (pediatric): Secondary | ICD-10-CM | POA: Diagnosis present

## 2020-06-13 DIAGNOSIS — R531 Weakness: Secondary | ICD-10-CM | POA: Diagnosis present

## 2020-06-13 DIAGNOSIS — U071 COVID-19: Principal | ICD-10-CM | POA: Diagnosis present

## 2020-06-13 DIAGNOSIS — I252 Old myocardial infarction: Secondary | ICD-10-CM

## 2020-06-13 DIAGNOSIS — I110 Hypertensive heart disease with heart failure: Secondary | ICD-10-CM | POA: Diagnosis present

## 2020-06-13 DIAGNOSIS — Z823 Family history of stroke: Secondary | ICD-10-CM

## 2020-06-13 DIAGNOSIS — C9000 Multiple myeloma not having achieved remission: Secondary | ICD-10-CM | POA: Diagnosis present

## 2020-06-13 DIAGNOSIS — Z8679 Personal history of other diseases of the circulatory system: Secondary | ICD-10-CM

## 2020-06-13 DIAGNOSIS — I509 Heart failure, unspecified: Secondary | ICD-10-CM | POA: Diagnosis present

## 2020-06-13 DIAGNOSIS — K219 Gastro-esophageal reflux disease without esophagitis: Secondary | ICD-10-CM | POA: Diagnosis present

## 2020-06-13 DIAGNOSIS — I2510 Atherosclerotic heart disease of native coronary artery without angina pectoris: Secondary | ICD-10-CM | POA: Diagnosis present

## 2020-06-13 DIAGNOSIS — Z7982 Long term (current) use of aspirin: Secondary | ICD-10-CM

## 2020-06-13 DIAGNOSIS — R0600 Dyspnea, unspecified: Secondary | ICD-10-CM | POA: Diagnosis present

## 2020-06-13 DIAGNOSIS — Z96641 Presence of right artificial hip joint: Secondary | ICD-10-CM | POA: Diagnosis present

## 2020-06-13 DIAGNOSIS — D61818 Other pancytopenia: Secondary | ICD-10-CM | POA: Diagnosis not present

## 2020-06-13 DIAGNOSIS — E119 Type 2 diabetes mellitus without complications: Secondary | ICD-10-CM | POA: Diagnosis present

## 2020-06-13 DIAGNOSIS — Z8249 Family history of ischemic heart disease and other diseases of the circulatory system: Secondary | ICD-10-CM

## 2020-06-13 DIAGNOSIS — Z888 Allergy status to other drugs, medicaments and biological substances status: Secondary | ICD-10-CM

## 2020-06-13 DIAGNOSIS — Z79899 Other long term (current) drug therapy: Secondary | ICD-10-CM

## 2020-06-13 LAB — BASIC METABOLIC PANEL
Anion gap: 10 (ref 5–15)
BUN: 22 mg/dL (ref 8–23)
CO2: 22 mmol/L (ref 22–32)
Calcium: 8.2 mg/dL — ABNORMAL LOW (ref 8.9–10.3)
Chloride: 105 mmol/L (ref 98–111)
Creatinine, Ser: 1.15 mg/dL (ref 0.61–1.24)
GFR calc Af Amer: >60 mL/min (ref >60)
GFR calc non Af Amer: >60 mL/min (ref >60)
Glucose, Bld: 111 mg/dL — ABNORMAL HIGH (ref 70–99)
Potassium: 3.8 mmol/L (ref 3.5–5.1)
Sodium: 137 mmol/L (ref 135–145)

## 2020-06-13 LAB — CBC
HCT: 33.3 % — ABNORMAL LOW (ref 39.0–52.0)
Hemoglobin: 11.6 g/dL — ABNORMAL LOW (ref 13.0–17.0)
MCH: 37.1 pg — ABNORMAL HIGH (ref 26.0–34.0)
MCHC: 34.8 g/dL (ref 30.0–36.0)
MCV: 106.4 fL — ABNORMAL HIGH (ref 80.0–100.0)
Platelets: 97 10*3/uL — ABNORMAL LOW (ref 150–400)
RBC: 3.13 MIL/uL — ABNORMAL LOW (ref 4.22–5.81)
RDW: 14.8 % (ref 11.5–15.5)
WBC: 2.6 10*3/uL — ABNORMAL LOW (ref 4.0–10.5)
nRBC: 0 % (ref 0.0–0.2)

## 2020-06-13 LAB — SARS CORONAVIRUS 2 BY RT PCR (HOSPITAL ORDER, PERFORMED IN ~~LOC~~ HOSPITAL LAB): SARS Coronavirus 2: POSITIVE — AB

## 2020-06-13 LAB — TROPONIN I (HIGH SENSITIVITY)
Troponin I (High Sensitivity): 7 ng/L (ref <18)
Troponin I (High Sensitivity): 11 ng/L (ref <18)

## 2020-06-13 NOTE — ED Triage Notes (Signed)
Pt here for cough and fatigue.  Has mild chest pain. "I just don't feel good".  Was exposed to covid recently. has had both covid shots.  Unlabored at this time. VSS.

## 2020-06-14 ENCOUNTER — Other Ambulatory Visit: Payer: Self-pay

## 2020-06-14 ENCOUNTER — Encounter: Payer: Self-pay | Admitting: Internal Medicine

## 2020-06-14 ENCOUNTER — Inpatient Hospital Stay
Admission: EM | Admit: 2020-06-14 | Discharge: 2020-06-18 | DRG: 178 | Disposition: A | Discharge status: Home Health | Payer: Medicare Other | Attending: Internal Medicine | Admitting: Internal Medicine

## 2020-06-14 ENCOUNTER — Inpatient Hospital Stay: Payer: Medicare Other

## 2020-06-14 DIAGNOSIS — Z7982 Long term (current) use of aspirin: Secondary | ICD-10-CM | POA: Diagnosis not present

## 2020-06-14 DIAGNOSIS — R531 Weakness: Secondary | ICD-10-CM | POA: Diagnosis present

## 2020-06-14 DIAGNOSIS — E119 Type 2 diabetes mellitus without complications: Secondary | ICD-10-CM | POA: Diagnosis present

## 2020-06-14 DIAGNOSIS — Z5111 Encounter for antineoplastic chemotherapy: Secondary | ICD-10-CM

## 2020-06-14 DIAGNOSIS — R5383 Other fatigue: Secondary | ICD-10-CM

## 2020-06-14 DIAGNOSIS — G4733 Obstructive sleep apnea (adult) (pediatric): Secondary | ICD-10-CM | POA: Diagnosis present

## 2020-06-14 DIAGNOSIS — Z79899 Other long term (current) drug therapy: Secondary | ICD-10-CM | POA: Diagnosis not present

## 2020-06-14 DIAGNOSIS — U071 COVID-19: Principal | ICD-10-CM

## 2020-06-14 DIAGNOSIS — I252 Old myocardial infarction: Secondary | ICD-10-CM | POA: Diagnosis not present

## 2020-06-14 DIAGNOSIS — Z7984 Long term (current) use of oral hypoglycemic drugs: Secondary | ICD-10-CM | POA: Diagnosis not present

## 2020-06-14 DIAGNOSIS — Z823 Family history of stroke: Secondary | ICD-10-CM | POA: Diagnosis not present

## 2020-06-14 DIAGNOSIS — R059 Cough, unspecified: Secondary | ICD-10-CM

## 2020-06-14 DIAGNOSIS — Z8679 Personal history of other diseases of the circulatory system: Secondary | ICD-10-CM | POA: Diagnosis not present

## 2020-06-14 DIAGNOSIS — E785 Hyperlipidemia, unspecified: Secondary | ICD-10-CM | POA: Diagnosis present

## 2020-06-14 DIAGNOSIS — Z87891 Personal history of nicotine dependence: Secondary | ICD-10-CM | POA: Diagnosis not present

## 2020-06-14 DIAGNOSIS — Z888 Allergy status to other drugs, medicaments and biological substances status: Secondary | ICD-10-CM | POA: Diagnosis not present

## 2020-06-14 DIAGNOSIS — I2510 Atherosclerotic heart disease of native coronary artery without angina pectoris: Secondary | ICD-10-CM | POA: Diagnosis present

## 2020-06-14 DIAGNOSIS — C9000 Multiple myeloma not having achieved remission: Secondary | ICD-10-CM

## 2020-06-14 DIAGNOSIS — R0902 Hypoxemia: Secondary | ICD-10-CM | POA: Diagnosis present

## 2020-06-14 DIAGNOSIS — I110 Hypertensive heart disease with heart failure: Secondary | ICD-10-CM | POA: Diagnosis present

## 2020-06-14 DIAGNOSIS — Z96641 Presence of right artificial hip joint: Secondary | ICD-10-CM | POA: Diagnosis present

## 2020-06-14 DIAGNOSIS — I25810 Atherosclerosis of coronary artery bypass graft(s) without angina pectoris: Secondary | ICD-10-CM

## 2020-06-14 DIAGNOSIS — I509 Heart failure, unspecified: Secondary | ICD-10-CM | POA: Diagnosis present

## 2020-06-14 DIAGNOSIS — Z8042 Family history of malignant neoplasm of prostate: Secondary | ICD-10-CM | POA: Diagnosis not present

## 2020-06-14 DIAGNOSIS — Z8249 Family history of ischemic heart disease and other diseases of the circulatory system: Secondary | ICD-10-CM | POA: Diagnosis not present

## 2020-06-14 DIAGNOSIS — K219 Gastro-esophageal reflux disease without esophagitis: Secondary | ICD-10-CM | POA: Diagnosis present

## 2020-06-14 DIAGNOSIS — D61818 Other pancytopenia: Secondary | ICD-10-CM | POA: Diagnosis not present

## 2020-06-14 DIAGNOSIS — R0600 Dyspnea, unspecified: Secondary | ICD-10-CM | POA: Diagnosis present

## 2020-06-14 LAB — GLUCOSE, CAPILLARY
Glucose-Capillary: 148 mg/dL — ABNORMAL HIGH (ref 70–99)
Glucose-Capillary: 254 mg/dL — ABNORMAL HIGH (ref 70–99)
Glucose-Capillary: 200 mg/dL — ABNORMAL HIGH (ref 70–99)
Glucose-Capillary: 156 mg/dL — ABNORMAL HIGH (ref 70–99)

## 2020-06-14 LAB — FIBRINOGEN: Fibrinogen: 509 mg/dL — ABNORMAL HIGH (ref 210–475)

## 2020-06-14 LAB — LACTATE DEHYDROGENASE: LDH: 147 U/L (ref 98–192)

## 2020-06-14 LAB — ABO/RH: ABO/RH(D): AB NEG

## 2020-06-14 LAB — BRAIN NATRIURETIC PEPTIDE: B Natriuretic Peptide: 91.4 pg/mL (ref 0.0–100.0)

## 2020-06-14 LAB — HEPATITIS B SURFACE ANTIGEN: Hepatitis B Surface Ag: NONREACTIVE

## 2020-06-14 LAB — C-REACTIVE PROTEIN: CRP: 6.5 mg/dL — ABNORMAL HIGH (ref <1.0)

## 2020-06-14 LAB — FIBRIN DERIVATIVES D-DIMER (ARMC ONLY): Fibrin derivatives D-dimer (ARMC): 962.81 ng/mL (FEU) — ABNORMAL HIGH (ref 0.00–499.00)

## 2020-06-14 LAB — FERRITIN: Ferritin: 371 ng/mL — ABNORMAL HIGH (ref 24–336)

## 2020-06-14 LAB — PROCALCITONIN: Procalcitonin: <0.1 ng/mL

## 2020-06-14 MED ORDER — ONDANSETRON HCL 4 MG/2ML IJ SOLN: 4.0000 mg | Freq: Four times a day (QID) | INTRAMUSCULAR | Status: DC | PRN

## 2020-06-14 MED ORDER — SODIUM CHLORIDE 0.9 % IV SOLN
100.0000 mg | Freq: Every day | INTRAVENOUS | Status: AC
  Administered 2020-06-15 – 2020-06-18 (×4): 100 mg via INTRAVENOUS
  Filled 2020-06-14: qty 100
  Filled 2020-06-14 (×3): qty 20

## 2020-06-14 MED ORDER — HYDROCOD POLST-CPM POLST ER 10-8 MG/5ML PO SUER
5.0000 mL | Freq: Two times a day (BID) | ORAL | Status: DC | PRN
  Administered 2020-06-14: 5 mL via ORAL
  Filled 2020-06-14: qty 5

## 2020-06-14 MED ORDER — ENOXAPARIN SODIUM 40 MG/0.4ML ~~LOC~~ SOLN
40.0000 mg | SUBCUTANEOUS | Status: DC
  Administered 2020-06-14 – 2020-06-18 (×5): 40 mg via SUBCUTANEOUS
  Filled 2020-06-14 (×5): qty 0.4

## 2020-06-14 MED ORDER — SODIUM CHLORIDE 0.9 % IV BOLUS
500.0000 mL | Freq: Once | INTRAVENOUS | Status: AC
  Administered 2020-06-14: 500 mL via INTRAVENOUS

## 2020-06-14 MED ORDER — SODIUM CHLORIDE 0.9 % IV SOLN: 200.0000 mg | Freq: Once | INTRAVENOUS | Status: DC

## 2020-06-14 MED ORDER — ACETAMINOPHEN 325 MG PO TABS: 650.0000 mg | ORAL_TABLET | Freq: Four times a day (QID) | ORAL | Status: DC | PRN

## 2020-06-14 MED ORDER — ADULT MULTIVITAMIN W/MINERALS CH
1.0000 | ORAL_TABLET | Freq: Every day | ORAL | Status: DC
  Administered 2020-06-14 – 2020-06-18 (×5): 1 via ORAL
  Filled 2020-06-14 (×5): qty 1

## 2020-06-14 MED ORDER — ONDANSETRON HCL 4 MG PO TABS: 4.0000 mg | ORAL_TABLET | Freq: Four times a day (QID) | ORAL | Status: DC | PRN

## 2020-06-14 MED ORDER — IOHEXOL 350 MG/ML SOLN
75.0000 mL | Freq: Once | INTRAVENOUS | Status: AC | PRN
  Administered 2020-06-14: 75 mL via INTRAVENOUS

## 2020-06-14 MED ORDER — SODIUM CHLORIDE 0.9 % IV SOLN
200.0000 mg | Freq: Once | INTRAVENOUS | Status: AC
  Administered 2020-06-14: 200 mg via INTRAVENOUS
  Filled 2020-06-14: qty 40

## 2020-06-14 MED ORDER — ALBUTEROL SULFATE HFA 108 (90 BASE) MCG/ACT IN AERS
2.0000 | INHALATION_SPRAY | Freq: Four times a day (QID) | RESPIRATORY_TRACT | Status: DC
  Administered 2020-06-14 – 2020-06-18 (×11): 2 via RESPIRATORY_TRACT
  Filled 2020-06-14 (×2): qty 6.7

## 2020-06-14 MED ORDER — INSULIN ASPART 100 UNIT/ML ~~LOC~~ SOLN
0.0000 [IU] | Freq: Three times a day (TID) | SUBCUTANEOUS | Status: DC
  Administered 2020-06-14: 8 [IU] via SUBCUTANEOUS
  Administered 2020-06-14: 2 [IU] via SUBCUTANEOUS
  Administered 2020-06-14: 3 [IU] via SUBCUTANEOUS
  Administered 2020-06-15: 2 [IU] via SUBCUTANEOUS
  Administered 2020-06-15 – 2020-06-16 (×3): 3 [IU] via SUBCUTANEOUS
  Administered 2020-06-16: 2 [IU] via SUBCUTANEOUS
  Administered 2020-06-17 – 2020-06-18 (×4): 3 [IU] via SUBCUTANEOUS
  Filled 2020-06-14 (×12): qty 1

## 2020-06-14 MED ORDER — SODIUM CHLORIDE 0.9 % IV SOLN: 100.0000 mg | Freq: Every day | INTRAVENOUS | Status: DC

## 2020-06-14 MED ORDER — INSULIN DETEMIR 100 UNIT/ML ~~LOC~~ SOLN
0.0750 [IU]/kg | Freq: Two times a day (BID) | SUBCUTANEOUS | Status: DC
  Administered 2020-06-14 – 2020-06-17 (×8): 6 [IU] via SUBCUTANEOUS
  Filled 2020-06-14 (×11): qty 0.06

## 2020-06-14 MED ORDER — DEXAMETHASONE SODIUM PHOSPHATE 10 MG/ML IJ SOLN
6.0000 mg | INTRAMUSCULAR | Status: DC
  Administered 2020-06-14 – 2020-06-15 (×2): 6 mg via INTRAVENOUS
  Filled 2020-06-14 (×2): qty 1

## 2020-06-14 MED ORDER — ASCORBIC ACID 500 MG PO TABS
500.0000 mg | ORAL_TABLET | Freq: Every day | ORAL | Status: DC
  Administered 2020-06-14 – 2020-06-18 (×5): 500 mg via ORAL
  Filled 2020-06-14 (×5): qty 1

## 2020-06-14 MED ORDER — INSULIN ASPART 100 UNIT/ML ~~LOC~~ SOLN
0.0000 [IU] | Freq: Every day | SUBCUTANEOUS | Status: DC
  Administered 2020-06-17: 2 [IU] via SUBCUTANEOUS
  Filled 2020-06-14: qty 1

## 2020-06-14 MED ORDER — ZINC SULFATE 220 (50 ZN) MG PO CAPS
220.0000 mg | ORAL_CAPSULE | Freq: Every day | ORAL | Status: DC
  Administered 2020-06-14 – 2020-06-18 (×5): 220 mg via ORAL
  Filled 2020-06-14 (×5): qty 1

## 2020-06-14 MED ORDER — GUAIFENESIN-DM 100-10 MG/5ML PO SYRP
10.0000 mL | ORAL_SOLUTION | ORAL | Status: DC | PRN
  Administered 2020-06-15 – 2020-06-17 (×4): 10 mL via ORAL
  Filled 2020-06-14 (×5): qty 10

## 2020-06-14 MED ORDER — LINAGLIPTIN 5 MG PO TABS
5.0000 mg | ORAL_TABLET | Freq: Every day | ORAL | Status: DC
  Administered 2020-06-14 – 2020-06-18 (×5): 5 mg via ORAL
  Filled 2020-06-14 (×5): qty 1

## 2020-06-14 MED ORDER — DEXAMETHASONE SODIUM PHOSPHATE 10 MG/ML IJ SOLN: 6.0000 mg | INTRAMUSCULAR | Status: DC

## 2020-06-14 MED ORDER — ADULT MULTIVITAMIN W/MINERALS CH: 1.0000 | ORAL_TABLET | Freq: Every day | ORAL | Status: DC

## 2020-06-14 NOTE — Progress Notes (Signed)
Remdesivir - Pharmacy Brief Note   O:  ALT:  CXR:  SpO2: 94% on RA   A/P:  Remdesivir 200 mg IVPB once followed by 100 mg IVPB daily x 4 days.   Hart Robinsons, PharmD Clinical Pharmacist   06/14/2020 3:32 AM

## 2020-06-14 NOTE — ED Notes (Signed)
Pt assisted to toilet, diarrhea noted, pt cleaned and new bedding applied

## 2020-06-14 NOTE — ED Provider Notes (Signed)
[facility] Emergency Department Provider Note   ____________________________________________   First MD Initiated Contact with Patient 06/14/20 0321     (approximate)  I have reviewed the triage vital signs and the nursing notes.   HISTORY  Chief Complaint Fatigue    HPI Luis [name]. is a 74 y.o. male who presents to the ED from home with a chief complaint of cough, fatigue and chest pain.  Patient has had both Covid 19 vaccinations.  He was exposed over the weekend and developed the above symptoms 4 to 5 days ago.  Was generally weak tonight and unable to ambulate secondary to weakness.  Reports subjective fevers, nonproductive cough, chest pain, shortness of breath and fatigue.  Denies abdominal pain, nausea, vomiting or diarrhea.      Past Medical History:  Diagnosis Date  . Angina pectoris ([place])   . Anxiety   . Arthritis   . CHF (congestive heart failure) ([place])   . Coronary artery disease   . Depression   . Diabetes mellitus without complication [facility])    Patient takes Metformin.  . Diverticulosis   . GERD (gastroesophageal reflux disease)   . Hyperlipidemia   . Hypertension   . Multiple myeloma ([place])   . Multiple myeloma ([place]) 03/27/2015  . Myocardial infarction [facility]) April 2001   widowmaker  . Shortness of breath dyspnea   . Sleep apnea    No CPAP @ present  . Spinal stenosis     Patient Active Problem List   Diagnosis Date Noted  . Maintenance chemotherapy 06/14/2020  . COVID-19 virus infection (vaccinated Pfizer 01/2020) 06/14/2020  . COVID-19 06/14/2020  . Weight loss 11/22/2019  . Compression fracture of body of thoracic vertebra ([place]) 09/19/2019  . Macrocytic anemia 03/25/2019  . Renal insufficiency 03/25/2019  . Abnormal iron saturation 02/18/2019  . Bilateral carotid artery stenosis 03/21/2018  . Connective tissue and disc stenosis of intervertebral foramina of lumbar region 02/06/2018  . Hip strain, initial encounter  02/06/2018  . History of total right hip replacement 02/06/2018  . Obesity (BMI 30-39.9) 02/06/2018  . Goals of care, counseling/discussion 11/18/2017  . Drug-induced neutropenia ([place]) 01/23/2017  . Hypocalcemia 06/28/2016  . Type 2 diabetes mellitus without complication, without long-term current use of insulin ([place]) 02/17/2016  . Arthritis 05/01/2015  . TI (tricuspid incompetence) 04/16/2015  . Benign essential HTN 04/04/2015  . B12 deficiency 03/31/2015  . Diarrhea 03/31/2015  . Multiple myeloma ([place]) 03/27/2015  . 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 MI (mitral incompetence) 09/17/2014  . Obstructive apnea 09/17/2014  . Neuritis or radiculitis due to rupture of lumbar intervertebral disc 08/27/2014  . Lumbar canal stenosis 08/27/2014  . Degenerative arthritis of lumbar spine 08/27/2014  . Degenerative arthritis of hip 08/27/2014  . Arthritis, degenerative 03/28/2014  . Chest pain 03/14/2014  . Generalized weakness 03/14/2014  . Breath shortness 03/14/2014  . 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 01/11/2014  . Combined fat and carbohydrate induced hyperlipemia 01/11/2014  . Compulsive tobacco user syndrome 01/11/2014  . CAD (coronary artery disease) of artery bypass graft 01/11/2014    Past Surgical History:  Procedure Laterality Date  . CARDIAC CATHETERIZATION    . CARPAL TUNNEL RELEASE    . CATARACT EXTRACTION    . COLONOSCOPY WITH PROPOFOL N/A 11/11/2015   Procedure: COLONOSCOPY WITH PROPOFOL;  Surgeon: Lollie Sails, MD;  Location: [facility] ENDOSCOPY;  Service: Endoscopy;  Laterality: N/A;  . CORONARY ARTERY BYPASS GRAFT    .  EYE SURGERY Bilateral    Cataract Extraction  . INGUINAL HERNIA REPAIR    . JOINT REPLACEMENT Right 2008   Right Total Hip Replacement  . PILONIDAL CYST EXCISION    . ROTATOR CUFF REPAIR    . TOTAL HIP ARTHROPLASTY Right   . VENTRAL HERNIA REPAIR N/A 08/15/2015   Procedure: VENTRAL HERNIA REPAIR WITH MESH ;   Surgeon: Leonie Green, MD;  Location: [facility] ORS;  Service: General;  Laterality: N/A;    Prior to Admission medications   Medication Sig Start Date End Date Taking? Authorizing Provider  ALPRAZolam (XANAX) 0.25 MG tablet Take 0.25 mg by mouth at bedtime as needed. for sleep 02/06/15  Yes [provider]  aspirin 81 MG tablet Take 81 mg by mouth daily.    Yes [provider]  Bee Pollen 500 MG CHEW Chew 1 tablet by mouth daily.    Yes [provider]  Calcium Carbonate-Vitamin D 600-400 MG-UNIT chew tablet Chew 1 tablet by mouth 2 (two) times daily.    Yes [provider]  Cyanocobalamin (VITAMIN B-12 IJ) Inject 1 Dose as directed every 30 (thirty) days.    Yes [provider]  ergocalciferol (VITAMIN D2) 50000 UNITS capsule Take 50,000 Units by mouth once a week.   Yes [provider]  losartan (COZAAR) 25 MG tablet Take 25 mg by mouth daily.  09/10/19 09/09/20 Yes [provider]  lovastatin (MEVACOR) 40 MG tablet Take 40 mg by mouth at bedtime.    Yes [provider]  metFORMIN (GLUCOPHAGE) 500 MG tablet 500 mg 2 (two) times daily with a meal.  08/03/16  Yes [provider]  omeprazole (PRILOSEC) 20 MG capsule Take 20 mg by mouth daily.   Yes [provider]  PARoxetine (PAXIL) 10 MG tablet Take 10 mg by mouth daily. 05/26/20  Yes [provider]  propranolol (INDERAL) 10 MG tablet Take 10 mg by mouth 2 (two) times daily. 11/22/19  Yes [provider]  traMADol (ULTRAM) 50 MG tablet TAKE 1 TABLET BY MOUTH THREE TIMES A DAY AS NEEDED 09/01/18  Yes Honor Loh E, NP  glucose blood (ONE TOUCH ULTRA TEST) test strip USE ONCE DAILY. USE AS INSTRUCTED. DX E11.9 10/04/18   [provider]    Allergies Pravachol [pravastatin], Pravastatin sodium, Statins, and Zocor [simvastatin]  Family History  Problem Relation Age of Onset  . Heart disease Father   . Stroke Mother   . Prostate  cancer Maternal Grandfather 90    Social History Social History   Tobacco Use  . Smoking status: Former Smoker    Packs/day: 1.50    Years: 40.00    Pack years: 60.00    Types: Cigarettes    Quit date: 02/24/2000    Years since quitting: 20.3  . Smokeless tobacco: Former Systems developer    Quit date: 03/04/2000  Vaping Use  . Vaping Use: Never used  Substance Use Topics  . Alcohol use: No  . Drug use: No    Review of Systems  Constitutional: Positive for subjective fever Eyes: No visual changes. ENT: No sore throat. Cardiovascular: Positive for chest pain. Respiratory: Positive for cough and shortness of breath. Gastrointestinal: No abdominal pain.  No nausea, no vomiting.  No diarrhea.  No constipation. Genitourinary: Negative for dysuria. Musculoskeletal: Negative for back pain. Skin: Negative for rash. Neurological: Negative for headaches, focal weakness or numbness.   ____________________________________________   PHYSICAL EXAM:  VITAL SIGNS: ED Triage Vitals  Enc  Vitals Group     BP 06/13/20 1558 (!) 139/100     Pulse Rate 06/13/20 1558 87     Resp 06/13/20 1558 20     Temp 06/13/20 1558 99.5 F (37.5 C)     Temp Source 06/13/20 1558 Oral     SpO2 06/13/20 1558 96 %     Weight 06/13/20 1557 180 lb (81.6 kg)     Height 06/13/20 1557 _0  (1.702 m)     Head Circumference --      Peak Flow --      Pain Score 06/13/20 1557 5     Pain Loc --      Pain Edu? --      Excl. in [place]? --     Constitutional: Alert and oriented.  Fatigued appearing and in mild acute distress. Eyes: Conjunctivae are normal. PERRL. EOMI. Head: Atraumatic. Nose: Congestion/rhinnorhea. Mouth/Throat: Mucous membranes are mildly dry.   Neck: No stridor.   Cardiovascular: Tachycardic rate, regular rhythm. Grossly normal heart sounds.  Good peripheral circulation. Respiratory: Increased respiratory effort.  No retractions. Lungs diminished bibasilarly.  Active loose cough. Gastrointestinal:  Soft and nontender. No distention. No abdominal bruits. No CVA tenderness. Musculoskeletal: No lower extremity tenderness nor edema.  No joint effusions. Neurologic:  Normal speech and language. No gross focal neurologic deficits are appreciated.  Skin:  Skin is warm, dry and intact. No rash noted.  No petechiae. Psychiatric: Mood and affect are normal. Speech and behavior are normal.  ____________________________________________   LABS (all labs ordered are listed, but only abnormal results are displayed)  Labs Reviewed  SARS CORONAVIRUS 2 BY RT PCR (HOSPITAL ORDER, [facility]) - Abnormal; Notable for the following components:      Result Value   SARS Coronavirus 2 POSITIVE (*)    All other components within normal limits  BASIC METABOLIC PANEL - Abnormal; Notable for the following components:   Glucose, Bld 111 (*)    Calcium 8.2 (*)    All other components within normal limits  CBC - Abnormal; Notable for the following components:   WBC 2.6 (*)    RBC 3.13 (*)    Hemoglobin 11.6 (*)    HCT 33.3 (*)    MCV 106.4 (*)    MCH 37.1 (*)    Platelets 97 (*)    All other components within normal limits  FIBRIN DERIVATIVES D-DIMER ([facility] ONLY) - Abnormal; Notable for the following components:   Fibrin derivatives D-dimer ([facility]) 962.81 (*)    All other components within normal limits  FERRITIN - Abnormal; Notable for the following components:   Ferritin 371 (*)    All other components within normal limits  FIBRINOGEN - Abnormal; Notable for the following components:   Fibrinogen 509 (*)    All other components within normal limits  BRAIN NATRIURETIC PEPTIDE  LACTATE DEHYDROGENASE  PROCALCITONIN  C-REACTIVE PROTEIN  HEPATITIS B SURFACE ANTIGEN  CBC  CREATININE, SERUM  ABO/RH  TROPONIN I (HIGH SENSITIVITY)  TROPONIN I (HIGH SENSITIVITY)   ____________________________________________  EKG  ED ECG REPORT I, Dietrich Ke J, the attending physician,  personally viewed and interpreted this ECG.   Date: 06/14/2020  EKG Time: 1558  Rate: 86  Rhythm: normal EKG, normal sinus rhythm  Axis: Normal  Intervals:none  ST&T Change: Nonspecific  ____________________________________________  RADIOLOGY  ED MD interpretation: No acute cardiopulmonary process  Official radiology report(s): DG Chest 2 View  Result Date: 06/13/2020 CLINICAL DATA:  Chest pain  EXAM: CHEST - 2 VIEW COMPARISON:  September 12, 2019. FINDINGS: The heart size is unchanged. The patient is status post prior CABG. Aortic calcifications are noted. Old healed left-sided rib fractures are again noted. The lungs are somewhat hyperexpanded. Scattered areas of scarring and atelectasis are again noted, greatest at the left lung base and at the left lung apex. There is an old compression fracture of the lower thoracic spine. IMPRESSION: No active cardiopulmonary disease. Electronically Signed   By: Constance Holster M.D.   On: 06/13/2020 16:32    ____________________________________________   PROCEDURES  Procedure(s) performed (including Critical Care):  Procedures   ____________________________________________   INITIAL IMPRESSION / ASSESSMENT AND PLAN / ED COURSE  As part of my medical decision making, I reviewed the following data within the [facility] notes reviewed and incorporated, Labs reviewed, Old chart reviewed, Radiograph reviewed and Notes from prior ED visits     Ibrahima Holberg. was evaluated in Emergency Department on 06/14/2020 for the symptoms described in the history of present illness. He was evaluated in the context of the global COVID-19 pandemic, which necessitated consideration that the patient might be at risk for infection with the SARS-CoV-2 virus that causes COVID-19. Institutional protocols and algorithms that pertain to the evaluation of patients at risk for COVID-19 are in a state of rapid change based on information  released by regulatory bodies including the CDC and federal and state organizations. These policies and algorithms were followed during the patient's care in the ED.    74 year old male with CAD presenting with fatigue, chest pain, cough and shortness of breath. Differential includes, but is not limited to, viral syndrome, bronchitis including COPD exacerbation, pneumonia, reactive airway disease including asthma, CHF including exacerbation with or without pulmonary/interstitial edema, pneumothorax, ACS, thoracic trauma, and pulmonary embolism.  Laboratory results positive for COVID-19.  No focal pneumonia on chest x-ray.  Patient is tachycardic, fatigued, actively coughing and mild respiratory distress.  Will initiate IV Decadron, Remdesivir, IV fluids and discuss with hospitalist services for admission.  Clinical Course as of Jun 14 710  Sat Jun 14, 2020  8948 Noted persistent tachycardia on the cardiac monitor.  Elevated D-dimer.  Will obtain CTA chest to evaluate for PE.   [JS]    Clinical Course User Index [JS] Paulette Blanch, MD     ____________________________________________   FINAL CLINICAL IMPRESSION(S) / ED DIAGNOSES  Final diagnoses:  Generalized weakness  COVID-19  Other fatigue  Cough     ED Discharge Orders    None       Note:  This document was prepared using Dragon voice recognition software and may include unintentional dictation errors.   Paulette Blanch, MD 06/14/20 [id number]

## 2020-06-14 NOTE — H&P (Signed)
History and Physical    Luis [name]. EVO:[id number] DOB: 1946/03/02 DOA: 06/14/2020  PCP: Tracie Harrier, MD   Patient coming from: Home  I have personally briefly reviewed patient's old medical records in [place]  Chief Complaint: Weakness, fatigue  HPI: Luis Kantz. is a 74 y.o. male with medical history significant for multiple myeloma on Revlimid followed by Dr. Mike Gip, DM, HTN, CAD status post CABG followed by Dr. Nehemiah Massed, anxiety and depression who presents to the emergency room with a 5-day history of protracted weakness, cough and chest pain.  .his weakness is to the point where he is unable to move around the home as usual.  He has associated subjective fevers, shortness of breath.  Denies nausea vomiting diarrhea abdominal pain.  Patient was vaccinated against Covid in March 2021.  Patient states over the weekend he was in the company of his unvaccinated brother who was coughing a lot and subsequently diagnosed with Covid. ED Course: On arrival he had a low-grade temperature of 99.5, HR 87, BP 139/100, O2 sat 96% on room air, going as low as 94% with ambulation.  Blood work showed leukopenia.  Covid PCR returned positive.Chest x-ray with no active disease.  Review of Systems: As per HPI otherwise all other systems on review of systems negative.    Past Medical History:  Diagnosis Date  . Angina pectoris ([place])   . Anxiety   . Arthritis   . CHF (congestive heart failure) ([place])   . Coronary artery disease   . Depression   . Diabetes mellitus without complication [facility])    Patient takes Metformin.  . Diverticulosis   . GERD (gastroesophageal reflux disease)   . Hyperlipidemia   . Hypertension   . Multiple myeloma ([place])   . Multiple myeloma ([place]) 03/27/2015  . Myocardial infarction [facility]) April 2001   widowmaker  . Shortness of breath dyspnea   . Sleep apnea    No CPAP @ present  . Spinal stenosis     Past Surgical History:  Procedure Laterality  Date  . CARDIAC CATHETERIZATION    . CARPAL TUNNEL RELEASE    . CATARACT EXTRACTION    . COLONOSCOPY WITH PROPOFOL N/A 11/11/2015   Procedure: COLONOSCOPY WITH PROPOFOL;  Surgeon: Lollie Sails, MD;  Location: [facility] ENDOSCOPY;  Service: Endoscopy;  Laterality: N/A;  . CORONARY ARTERY BYPASS GRAFT    . EYE SURGERY Bilateral    Cataract Extraction  . INGUINAL HERNIA REPAIR    . JOINT REPLACEMENT Right 2008   Right Total Hip Replacement  . PILONIDAL CYST EXCISION    . ROTATOR CUFF REPAIR    . TOTAL HIP ARTHROPLASTY Right   . VENTRAL HERNIA REPAIR N/A 08/15/2015   Procedure: VENTRAL HERNIA REPAIR WITH MESH ;  Surgeon: Leonie Green, MD;  Location: [facility] ORS;  Service: General;  Laterality: N/A;     reports that he quit smoking about 20 years ago. His smoking use included cigarettes. He has a 60.00 pack-year smoking history. He quit smokeless tobacco use about 20 years ago. He reports that he does not drink alcohol and does not use drugs.  Allergies  Allergen Reactions  . Pravachol [Pravastatin]   . Pravastatin Sodium Other (See Comments)  . Statins Other (See Comments)    Muscle aches  . Zocor [Simvastatin] Other (See Comments)    Muscle aches    Family History  Problem Relation Age of Onset  . Heart disease Father   . Stroke  Mother   . Prostate cancer Maternal Grandfather 90      Prior to Admission medications   Medication Sig Start Date End Date Taking? Authorizing Provider  ALPRAZolam (XANAX) 0.25 MG tablet Take 0.25 mg by mouth at bedtime as needed. for sleep 02/06/15   [provider]  amoxicillin (AMOXIL) 500 MG capsule TAKE 4 CAPSULES 1 HOUR PRIOR TO DENTAL APPT. 04/28/15   [provider]  aspirin 81 MG tablet Take 81 mg by mouth daily.     [provider]  Bee Pollen 500 MG CHEW Chew 1 tablet by mouth 2 (two) times daily.     [provider]  Calcium Carbonate-Vitamin D 600-400 MG-UNIT chew tablet Chew 2 tablets by mouth daily.      [provider]  Cyanocobalamin (VITAMIN B-12 IJ) Inject 1 Dose as directed every 30 (thirty) days.     [provider]  ergocalciferol (VITAMIN D2) 50000 UNITS capsule Take 50,000 Units by mouth once a week.    [provider]  glucose blood (ONE TOUCH ULTRA TEST) test strip USE ONCE DAILY. USE AS INSTRUCTED. DX E11.9 10/04/18   [provider]  losartan (COZAAR) 25 MG tablet Take 25 mg by mouth daily.  09/10/19 09/09/20  [provider]  lovastatin (MEVACOR) 40 MG tablet Take 20 mg by mouth at bedtime.     [provider]  metFORMIN (GLUCOPHAGE) 500 MG tablet 500 mg 2 (two) times daily with a meal.  08/03/16   [provider]  omeprazole (PRILOSEC) 20 MG capsule Take 20 mg by mouth daily.    [provider]  propranolol (INDERAL) 10 MG tablet Take 10 mg by mouth 2 (two) times daily. 11/22/19   [provider]  REVLIMID 2.5 MG capsule TAKE 1 CAPSULE BY MOUTH ONCE DAILY 06/04/20   Lequita Asal, MD  traMADol (ULTRAM) 50 MG tablet TAKE 1 TABLET BY MOUTH THREE TIMES A DAY AS NEEDED 09/01/18   Karen Kitchens, NP    Physical Exam: Vitals:   06/13/20 1557 06/13/20 1558 06/13/20 2035 06/14/20 0255  BP:  (!) 139/100 (!) 156/91 130/67  Pulse:  87 83 (!) 122  Resp:  20 20 (!) 21  Temp:  99.5 F (37.5 C)  98.2 F (36.8 C)  TempSrc:  Oral  Oral  SpO2:  96% 93% 95%  Weight: 81.6 kg     Height: _0  (1.702 m)        Vitals:   06/13/20 1557 06/13/20 1558 06/13/20 2035 06/14/20 0255  BP:  (!) 139/100 (!) 156/91 130/67  Pulse:  87 83 (!) 122  Resp:  20 20 (!) 21  Temp:  99.5 F (37.5 C)  98.2 F (36.8 C)  TempSrc:  Oral  Oral  SpO2:  96% 93% 95%  Weight: 81.6 kg     Height: _1  (1.702 m)         Constitutional:  Ill-looking with conversational dyspnea HEENT:      Head: Normocephalic and atraumatic.         Eyes: PERLA, EOMI, Conjunctivae are normal. Sclera is non-icteric.       Mouth/Throat: Mucous  membranes are moist.       Neck: Supple with no signs of meningismus. Cardiovascular:  Tachycardic. No murmurs, gallops, or rubs. 2+ symmetrical distal pulses are present . No JVD. No LE edema Respiratory: Respiratory effort increased.Lungs sounds diminished bilaterally.  Coarse breath sounds bilaterally Gastrointestinal: Soft, non tender, and non distended  with positive bowel sounds. No rebound or guarding. Genitourinary: No CVA tenderness. Musculoskeletal: Nontender with normal range of motion in all extremities. No cyanosis, or erythema of extremities. Neurologic: Normal speech and language. Face is symmetric. Moving all extremities. No gross focal neurologic deficits . Skin: Skin is warm, dry.  No rash or ulcers Psychiatric: Mood and affect are normal Speech and behavior are normal   Labs on Admission: I have personally reviewed following labs and imaging studies  CBC: Recent Labs  Lab 06/13/20 1604  WBC 2.6*  HGB 11.6*  HCT 33.3*  MCV 106.4*  PLT 97*   Basic Metabolic Panel: Recent Labs  Lab 06/13/20 1604  NA 137  K 3.8  CL 105  CO2 22  GLUCOSE 111*  BUN 22  CREATININE 1.15  CALCIUM 8.2*   GFR: Estimated Creatinine Clearance: 56.8 mL/min (by C-G formula based on SCr of 1.15 mg/dL). Liver Function Tests: No results for input(s): AST, ALT, ALKPHOS, BILITOT, PROT, ALBUMIN in the last 168 hours. No results for input(s): LIPASE, AMYLASE in the last 168 hours. No results for input(s): AMMONIA in the last 168 hours. Coagulation Profile: No results for input(s): INR, PROTIME in the last 168 hours. Cardiac Enzymes: No results for input(s): CKTOTAL, CKMB, CKMBINDEX, TROPONINI in the last 168 hours. BNP (last 3 results) No results for input(s): PROBNP in the last 8760 hours. HbA1C: No results for input(s): HGBA1C in the last 72 hours. CBG: No results for input(s): GLUCAP in the last 168 hours. Lipid Profile: No results for input(s): CHOL, HDL, LDLCALC, TRIG, CHOLHDL,  LDLDIRECT in the last 72 hours. Thyroid Function Tests: No results for input(s): TSH, T4TOTAL, FREET4, T3FREE, THYROIDAB in the last 72 hours. Anemia Panel: No results for input(s): VITAMINB12, FOLATE, FERRITIN, TIBC, IRON, RETICCTPCT in the last 72 hours. Urine analysis:    Component Value Date/Time   COLORURINE Yellow 08/07/2013 0102   APPEARANCEUR Clear 08/07/2013 0102   LABSPEC 1.023 08/07/2013 0102   PHURINE 5.0 08/07/2013 0102   GLUCOSEU 50 mg/dL 08/07/2013 0102   HGBUR Negative 08/07/2013 0102   BILIRUBINUR Negative 08/07/2013 0102   KETONESUR Negative 08/07/2013 0102   PROTEINUR 30 mg/dL 08/07/2013 0102   NITRITE Negative 08/07/2013 0102   LEUKOCYTESUR Negative 08/07/2013 0102    Radiological Exams on Admission: DG Chest 2 View  Result Date: 06/13/2020 CLINICAL DATA:  Chest pain EXAM: CHEST - 2 VIEW COMPARISON:  September 12, 2019. FINDINGS: The heart size is unchanged. The patient is status post prior CABG. Aortic calcifications are noted. Old healed left-sided rib fractures are again noted. The lungs are somewhat hyperexpanded. Scattered areas of scarring and atelectasis are again noted, greatest at the left lung base and at the left lung apex. There is an old compression fracture of the lower thoracic spine. IMPRESSION: No active cardiopulmonary disease. Electronically Signed   By: Constance Holster M.D.   On: 06/13/2020 16:32    EKG: Independently reviewed. Interpretation : Normal sinus rhythm  Assessment/Plan 74 year old male with multiple myeloma on Revlimid followed by Dr. Mike Gip, DM, HTN, CAD status post CABG anxiety and depression recently grieving the loss of his wife who presents to the emergency room with a 5-day history of protracted weakness, cough and chest pain, testing positive for Covid with clear chest x-ray.  Received Covid vaccines x2 in March.    Generalized weakness Breakthrough COVID-19 virus infection (vaccinated [place] 01/2020) -Patient with cough,  shortness of breath, mild hypoxia of 94, fatigue and low-grade fever.  Chest x-ray clear -  Remdesivir, antitussives, supplemental oxygen.  Vitamins -Hypoxia is mild but will give dexamethasone    Multiple myeloma (HCC)   Maintenance chemotherapy -No acute issues identified.  Patient follows with Dr. Mike Gip    Type 2 diabetes mellitus without complication, without long-term current use of insulin (HCC) -Subcutaneous insulin coverage per sliding scale    CAD (coronary artery disease) of artery bypass graft -Continue home meds pending med rec    DVT prophylaxis: Lovenox  Code Status: full code  Family Communication:  none  Disposition Plan: Back to previous home environment Consults called: none  Status:At the time of admission, it appears that the appropriate admission status for this patient is INPATIENT. This is judged to be reasonable and necessary in order to provide the required intensity of service to ensure the patient's safety given the presenting symptoms, physical exam findings, and initial radiographic and laboratory data in the context of their  Comorbid conditions.   Patient requires inpatient status due to high intensity of service, high risk for further deterioration and high frequency of surveillance required.   I certify that at the point of admission it is my clinical judgment that the patient will require inpatient hospital care spanning beyond [place] MD [facility]     06/14/2020, 4:24 AM

## 2020-06-14 NOTE — ED Notes (Signed)
Pt given breakfast meal tray at this time.  

## 2020-06-14 NOTE — Progress Notes (Signed)
Same day rounding progress note  Patient seen and examined in the ED, waiting for the floor bed.  74 year old male with multiple myeloma on Revlimid followed by Dr. Mike Gip, DM, HTN, CAD status post CABG anxiety and depression recently grieving the loss of his wife who presents to the emergency room with a 5-day history of protracted weakness, cough and chest pain, testing positive for Covid with clear chest x-ray.  Received Covid vaccines x2 in March.    Generalized weakness Breakthrough COVID-19 virus infection (vaccinated [place] 01/2020) -Patient with cough, shortness of breath, mild hypoxia of 94, fatigue and low-grade fever.  Chest x-ray clear -Remdesivir day 1/5, antitussives, supplemental oxygen.  Vitamins -Continue dexamethasone due to hypoxia    Multiple myeloma (HCC)   Maintenance chemotherapy -No acute issues identified.  Patient follows with Dr. Mike Gip    Type 2 diabetes mellitus without complication, without long-term current use of insulin (HCC) -Subcutaneous insulin coverage per sliding scale    CAD (coronary artery disease) of artery bypass graft -Continue home meds   Time spent: 15 minutes

## 2020-06-14 NOTE — ED Notes (Signed)
Pt to CT

## 2020-06-14 NOTE — ED Notes (Signed)
Pt ambulated to restroom with cane and standby assistance at this time.

## 2020-06-15 LAB — CBC WITH DIFFERENTIAL/PLATELET
Abs Immature Granulocytes: 0.01 10*3/uL (ref 0.00–0.07)
Basophils Absolute: 0 10*3/uL (ref 0.0–0.1)
Basophils Relative: 0 %
Eosinophils Absolute: 0 10*3/uL (ref 0.0–0.5)
Eosinophils Relative: 0 %
HCT: 31.7 % — ABNORMAL LOW (ref 39.0–52.0)
Hemoglobin: 11.1 g/dL — ABNORMAL LOW (ref 13.0–17.0)
Immature Granulocytes: 1 %
Lymphocytes Relative: 34 %
Lymphs Abs: 0.5 10*3/uL — ABNORMAL LOW (ref 0.7–4.0)
MCH: 37.4 pg — ABNORMAL HIGH (ref 26.0–34.0)
MCHC: 35 g/dL (ref 30.0–36.0)
MCV: 106.7 fL — ABNORMAL HIGH (ref 80.0–100.0)
Monocytes Absolute: 0.3 10*3/uL (ref 0.1–1.0)
Monocytes Relative: 18 %
Neutro Abs: 0.7 10*3/uL — ABNORMAL LOW (ref 1.7–7.7)
Neutrophils Relative %: 47 %
Platelets: 75 10*3/uL — ABNORMAL LOW (ref 150–400)
RBC: 2.97 MIL/uL — ABNORMAL LOW (ref 4.22–5.81)
RDW: 14.1 % (ref 11.5–15.5)
Smear Review: NORMAL
WBC: 1.4 10*3/uL — CL (ref 4.0–10.5)
nRBC: 0 % (ref 0.0–0.2)

## 2020-06-15 LAB — GLUCOSE, CAPILLARY
Glucose-Capillary: 132 mg/dL — ABNORMAL HIGH (ref 70–99)
Glucose-Capillary: 161 mg/dL — ABNORMAL HIGH (ref 70–99)
Glucose-Capillary: 192 mg/dL — ABNORMAL HIGH (ref 70–99)
Glucose-Capillary: 143 mg/dL — ABNORMAL HIGH (ref 70–99)

## 2020-06-15 LAB — COMPREHENSIVE METABOLIC PANEL
ALT: 16 U/L (ref 0–44)
AST: 27 U/L (ref 15–41)
Albumin: 2.6 g/dL — ABNORMAL LOW (ref 3.5–5.0)
Alkaline Phosphatase: 57 U/L (ref 38–126)
Anion gap: 9 (ref 5–15)
BUN: 28 mg/dL — ABNORMAL HIGH (ref 8–23)
CO2: 19 mmol/L — ABNORMAL LOW (ref 22–32)
Calcium: 7.6 mg/dL — ABNORMAL LOW (ref 8.9–10.3)
Chloride: 109 mmol/L (ref 98–111)
Creatinine, Ser: 0.93 mg/dL (ref 0.61–1.24)
GFR calc Af Amer: >60 mL/min (ref >60)
GFR calc non Af Amer: >60 mL/min (ref >60)
Glucose, Bld: 152 mg/dL — ABNORMAL HIGH (ref 70–99)
Potassium: 4.2 mmol/L (ref 3.5–5.1)
Sodium: 137 mmol/L (ref 135–145)
Total Bilirubin: 0.8 mg/dL (ref 0.3–1.2)
Total Protein: 7.2 g/dL (ref 6.5–8.1)

## 2020-06-15 LAB — PHOSPHORUS: Phosphorus: 3.1 mg/dL (ref 2.5–4.6)

## 2020-06-15 LAB — C-REACTIVE PROTEIN: CRP: 4.2 mg/dL — ABNORMAL HIGH (ref <1.0)

## 2020-06-15 LAB — FERRITIN: Ferritin: 319 ng/mL (ref 24–336)

## 2020-06-15 LAB — FIBRIN DERIVATIVES D-DIMER (ARMC ONLY): Fibrin derivatives D-dimer (ARMC): 743.36 ng/mL (FEU) — ABNORMAL HIGH (ref 0.00–499.00)

## 2020-06-15 LAB — MAGNESIUM: Magnesium: 1.4 mg/dL — ABNORMAL LOW (ref 1.7–2.4)

## 2020-06-15 MED ORDER — MAGNESIUM SULFATE 4 GM/100ML IV SOLN
4.0000 g | Freq: Once | INTRAVENOUS | Status: AC
  Administered 2020-06-15: 4 g via INTRAVENOUS
  Filled 2020-06-15: qty 100

## 2020-06-15 MED ORDER — DEXAMETHASONE 4 MG PO TABS
6.0000 mg | ORAL_TABLET | Freq: Every day | ORAL | Status: DC
  Administered 2020-06-16 – 2020-06-18 (×3): 6 mg via ORAL
  Filled 2020-06-15 (×3): qty 2

## 2020-06-15 NOTE — Progress Notes (Signed)
PROGRESS NOTE    Luis [name].  XEN:[id number] DOB: Apr 16, 1946 DOA: 06/14/2020 PCP: Tracie Harrier, MD   Brief Narrative:  Luis [name]. is a 74 y.o. male with medical history significant for multiple myeloma on Revlimid followed by Dr. Mike Gip, DM, HTN, CAD status post CABG followed by Dr. Nehemiah Massed, anxiety and depression who presents to the emergency room with a 5-day history of protracted weakness, cough and chest pain.  Patient still grieving the loss of his wife, who died in 2019/10/31 secondary to brain aneurysm rupture.  Patient is vaccinated with [place] in March 2021.  Over the weekend he had a company of his unvaccinated brother who was coughing and subsequently diagnosed with Covid. Covid PCR came back positive.  Chest x-ray with no active disease.  He was started on remdesivir. No Hypoxia.  Subjective: Patient has no new complaint today.  He was mostly talking about his deceased wife, stating that they were together for 57 years and he really misses her.  He lost wife last year in 10-31-23 due to ruptured brain aneurysm.  Assessment & Plan:   Principal Problem:   Generalized weakness Active Problems:   Multiple myeloma (HCC)   Obstructive apnea   Type 2 diabetes mellitus without complication, without long-term current use of insulin (HCC)   CAD (coronary artery disease) of artery bypass graft   Maintenance chemotherapy   COVID-19 virus infection (vaccinated Pfizer 01/2020)   COVID-19  Breakthrough COVID-19 virus infection (vaccinated [place] 01/2020). Mildly elevated inflammatory markers which are improving now.  Patient was started on remdesivir -day 2 today.  No hypoxia or chest x-ray changes. -Continue to monitor inflammatory markers-if continue to improve we can stop remdesivir earlier. -Switch IV Decadron with oral. -Continue supportive care. -Would have been a good candidate for monoclonal antibodies.  Multiple myeloma (HCC) -No acute issues identified.  Patient follows with Dr. Mike Gip  Type 2 diabetes mellitus without complication, without long-term current use of insulin (HCC) CBG within goal most of the time -Subcutaneous insulin coverage per sliding scale  CAD (coronary artery disease) of artery bypass graft.  No chest pain -Continue home meds   Objective: Vitals:   06/15/20 0017 06/15/20 0446 06/15/20 0744 06/15/20 1219  BP: 107/76 (!) 145/87 129/70 (!) 121/49  Pulse: 89 98 (!) 104 73  Resp: '17 15 19   ' Temp: (!) 97.5 F (36.4 C) 97.7 F (36.5 C) 97.6 F (36.4 C) 97.7 F (36.5 C)  TempSrc: Oral Oral Oral Oral  SpO2: 94% 96% 92% 95%  Weight:      Height:        Intake/Output Summary (Last 24 hours) at 06/15/2020 1622 Last data filed at 06/15/2020 1507 Gross per 24 hour  Intake 200.31 ml  Output 600 ml  Net -399.69 ml   Filed Weights   06/13/20 1557  Weight: 81.6 kg    Examination:  General exam: Appears calm and comfortable  Respiratory system: Clear to auscultation. Respiratory effort normal. Cardiovascular system: S1 & S2 heard, RRR. No JVD, murmurs, Gastrointestinal system: Soft, nontender, nondistended, bowel sounds positive. Central nervous system: Alert and oriented. No focal neurological deficits.Symmetric 5 x 5 power. Extremities: No edema, no cyanosis, pulses intact and symmetrical. Psychiatry: Judgement and insight appear normal. Mood & affect appropriate.    DVT prophylaxis: Lovenox Code Status: Full Family Communication: Call son with no response. Disposition Plan:  Status is: Inpatient  Remains inpatient appropriate because:Inpatient level of care appropriate due to severity of illness  Dispo: The patient is from: Home              Anticipated d/c is to: Home              Anticipated d/c date is: 1 day              Patient currently is not medically stable to d/c.    Consultants:   None  Procedures:  Antimicrobials:   Data Reviewed: I have personally reviewed following labs  and imaging studies  CBC: Recent Labs  Lab 06/13/20 1604 06/15/20 0425  WBC 2.6* 1.4*  NEUTROABS  --  0.7*  HGB 11.6* 11.1*  HCT 33.3* 31.7*  MCV 106.4* 106.7*  PLT 97* 75*   Basic Metabolic Panel: Recent Labs  Lab 06/13/20 1604 06/15/20 0425  NA 137 137  K 3.8 4.2  CL 105 109  CO2 22 19*  GLUCOSE 111* 152*  BUN 22 28*  CREATININE 1.15 0.93  CALCIUM 8.2* 7.6*  MG  --  1.4*  PHOS  --  3.1   GFR: Estimated Creatinine Clearance: 70.2 mL/min (by C-G formula based on SCr of 0.93 mg/dL). Liver Function Tests: Recent Labs  Lab 06/15/20 0425  AST 27  ALT 16  ALKPHOS 57  BILITOT 0.8  PROT 7.2  ALBUMIN 2.6*   No results for input(s): LIPASE, AMYLASE in the last 168 hours. No results for input(s): AMMONIA in the last 168 hours. Coagulation Profile: No results for input(s): INR, PROTIME in the last 168 hours. Cardiac Enzymes: No results for input(s): CKTOTAL, CKMB, CKMBINDEX, TROPONINI in the last 168 hours. BNP (last 3 results) No results for input(s): PROBNP in the last 8760 hours. HbA1C: No results for input(s): HGBA1C in the last 72 hours. CBG: Recent Labs  Lab 06/14/20 1200 06/14/20 1719 06/14/20 2113 06/15/20 0737 06/15/20 1214  GLUCAP 254* 200* 156* 132* 161*   Lipid Profile: No results for input(s): CHOL, HDL, LDLCALC, TRIG, CHOLHDL, LDLDIRECT in the last 72 hours. Thyroid Function Tests: No results for input(s): TSH, T4TOTAL, FREET4, T3FREE, THYROIDAB in the last 72 hours. Anemia Panel: Recent Labs    06/14/20 0356 06/15/20 0425  FERRITIN 371* 319   Sepsis Labs: Recent Labs  Lab 06/14/20 0356  PROCALCITON <0.10    Recent Results (from the past 240 hour(s))  SARS Coronavirus 2 by RT PCR (hospital order, performed in [facility] hospital lab) Nasopharyngeal Nasopharyngeal Swab     Status: Abnormal   Collection Time: 06/13/20  4:03 PM   Specimen: Nasopharyngeal Swab  Result Value Ref Range Status   SARS Coronavirus 2 POSITIVE (A)  NEGATIVE Final    Comment: RESULT CALLED TO, READ BACK BY AND VERIFIED WITH: VALERIE CHANDLER 06/13/20 AT 5883 BY ACR (NOTE) SARS-CoV-2 target nucleic acids are DETECTED  SARS-CoV-2 RNA is generally detectable in upper respiratory specimens  during the acute phase of infection.  Positive results are indicative  of the presence of the identified virus, but do not rule out bacterial infection or co-infection with other pathogens not detected by the test.  Clinical correlation with patient history and  other diagnostic information is necessary to determine patient infection status.  The expected result is negative.  Fact Sheet for Patients:   StrictlyIdeas.no   Fact Sheet for Healthcare Providers:   BankingDealers.co.za    This test is not yet approved or cleared by the [place] FDA and  has been authorized for detection and/or diagnosis of SARS-CoV-2 by FDA under an Emergency Use  Authorization (EUA).  This EUA will remain in effect (meaning thi s test can be used) for the duration of  the COVID-19 declaration under Section 564(b)(1) of the Act, 21 U.S.C. section 360-bbb-3(b)(1), unless the authorization is terminated or revoked sooner.  Performed at [facility], [address], [place], [place] [address]      Radiology Studies: DG Chest 2 View  Result Date: 06/13/2020 CLINICAL DATA:  Chest pain EXAM: CHEST - 2 VIEW COMPARISON:  September 12, 2019. FINDINGS: The heart size is unchanged. The patient is status post prior CABG. Aortic calcifications are noted. Old healed left-sided rib fractures are again noted. The lungs are somewhat hyperexpanded. Scattered areas of scarring and atelectasis are again noted, greatest at the left lung base and at the left lung apex. There is an old compression fracture of the lower thoracic spine. IMPRESSION: No active cardiopulmonary disease. Electronically Signed   By: Constance Holster M.D.    On: 06/13/2020 16:32   CT Angio Chest PE W/Cm &/Or Wo Cm  Result Date: 06/14/2020 CLINICAL DATA:  PE suspected, high probability, COVID positive, chest pain, shortness of breath, tachycardia. EXAM: CT ANGIOGRAPHY CHEST WITH CONTRAST TECHNIQUE: Multidetector CT imaging of the chest was performed using the standard protocol during bolus administration of intravenous contrast. Multiplanar CT image reconstructions and MIPs were obtained to evaluate the vascular anatomy. CONTRAST:  73m OMNIPAQUE IOHEXOL 350 MG/ML SOLN COMPARISON:  CT chest 09/26/2014. FINDINGS: CARDIOVASCULAR: Status post median sternotomy and CABG. Mild cardiomegaly. Coronary artery atherosclerotic calcifications. There is no thoracic aortic aneurysm.  Aortic atherosclerosis. Assessment for pulmonary embolus is limited by significant respiratory motion artifact. Within this limitation, no pulmonary embolus is identified. Prominence of the main pulmonary artery measuring 3.9 cm in diameter, findings suggestive of pulmonary hypertension. MEDIASTINUM/NODES: No enlarged mediastinal, hilar or axillary lymph nodes. LUNGS/PLEURA: Centrilobular and paraseptal emphysema. There is subtle ground-glass opacity within the inferior right middle lobe and dependent right lower lobe which may reflect subsegmental atelectasis or subtle changes of atypical/viral pneumonia. No confluent airspace consolidation. Redemonstrated chronic left-sided pleural thickening with associated pleural calcifications. Associated opacities abutting the pleura within the left upper and lower lobes consistent with round atelectasis. The central airways are grossly patent UPPER ABDOMEN: No acute abnormality within the imaged upper abdomen. MUSCULOSKELETAL: Exaggerated thoracic kyphosis. There are widespread lytic lesions consistent with the known history of multiple myeloma. Redemonstrated chronic T11 vertebral compression fracture. Old healed left-sided rib fracture deformities. Review of  the MIP images confirms the above findings. IMPRESSION: Evaluation for pulmonary embolus is limited by significant respiratory motion artifact. No pulmonary embolus is identified. Subtle ground-glass opacities within the inferior right middle lobe and posterior right lower lobe which may reflect subsegmental atelectasis or subtle changes of atypical/viral pneumonia. Prominence of the main pulmonary artery suggestive of pulmonary hypertension. Centrilobular and paraseptal emphysema. Redemonstrated left-sided pleural thickening with pleural calcifications likely reflecting sequela of prior asbestos exposure. Adjacent round atelectasis within the left upper and lower lobes. Mild cardiomegaly status post CABG. Widespread lytic osseous lesions consistent with the history of multiple myeloma. Chronic T11 compression fracture. Electronically Signed   By: KKellie SimmeringDO   On: 06/14/2020 07:26    Scheduled Meds: . albuterol  2 puff Inhalation Q6H  . vitamin C  500 mg Oral Daily  . dexamethasone (DECADRON) injection  6 mg Intravenous Q24H  . enoxaparin (LOVENOX) injection  40 mg Subcutaneous Q24H  . insulin aspart  0-15 Units Subcutaneous TID WC  . insulin aspart  0-5 Units Subcutaneous QHS  . insulin detemir  0.075 Units/kg Subcutaneous BID  . linagliptin  5 mg Oral Daily  . multivitamin with minerals  1 tablet Oral Daily  . zinc sulfate  220 mg Oral Daily   Continuous Infusions: . remdesivir 100 mg in NS 100 mL 100 mg (06/15/20 0928)     LOS: 1 day   Time spent: 40 minutes.  Lorella Nimrod, MD [facility]  If 7PM-7AM, please contact night-coverage [email or web address]  06/15/2020, 4:22 PM   This record has been created using Systems analyst. Errors have been sought and corrected,but may not always be located. Such creation errors do not reflect on the standard of care.

## 2020-06-16 LAB — COMPREHENSIVE METABOLIC PANEL
ALT: 18 U/L (ref 0–44)
AST: 36 U/L (ref 15–41)
Albumin: 2.7 g/dL — ABNORMAL LOW (ref 3.5–5.0)
Alkaline Phosphatase: 53 U/L (ref 38–126)
Anion gap: 9 (ref 5–15)
BUN: 29 mg/dL — ABNORMAL HIGH (ref 8–23)
CO2: 19 mmol/L — ABNORMAL LOW (ref 22–32)
Calcium: 7.6 mg/dL — ABNORMAL LOW (ref 8.9–10.3)
Chloride: 108 mmol/L (ref 98–111)
Creatinine, Ser: 1.01 mg/dL (ref 0.61–1.24)
GFR calc Af Amer: >60 mL/min (ref >60)
GFR calc non Af Amer: >60 mL/min (ref >60)
Glucose, Bld: 105 mg/dL — ABNORMAL HIGH (ref 70–99)
Potassium: 4 mmol/L (ref 3.5–5.1)
Sodium: 136 mmol/L (ref 135–145)
Total Bilirubin: 0.8 mg/dL (ref 0.3–1.2)
Total Protein: 6.8 g/dL (ref 6.5–8.1)

## 2020-06-16 LAB — IRON AND TIBC
Iron: 108 ug/dL (ref 45–182)
Saturation Ratios: 59 % — ABNORMAL HIGH (ref 17.9–39.5)
TIBC: 182 ug/dL — ABNORMAL LOW (ref 250–450)
UIBC: 74 ug/dL

## 2020-06-16 LAB — CBC WITH DIFFERENTIAL/PLATELET
Abs Immature Granulocytes: 0.01 10*3/uL (ref 0.00–0.07)
Basophils Absolute: 0 10*3/uL (ref 0.0–0.1)
Basophils Relative: 0 %
Eosinophils Absolute: 0 10*3/uL (ref 0.0–0.5)
Eosinophils Relative: 1 %
HCT: 29.1 % — ABNORMAL LOW (ref 39.0–52.0)
Hemoglobin: 10.3 g/dL — ABNORMAL LOW (ref 13.0–17.0)
Immature Granulocytes: 1 %
Lymphocytes Relative: 28 %
Lymphs Abs: 0.6 10*3/uL — ABNORMAL LOW (ref 0.7–4.0)
MCH: 37.7 pg — ABNORMAL HIGH (ref 26.0–34.0)
MCHC: 35.4 g/dL (ref 30.0–36.0)
MCV: 106.6 fL — ABNORMAL HIGH (ref 80.0–100.0)
Monocytes Absolute: 0.4 10*3/uL (ref 0.1–1.0)
Monocytes Relative: 18 %
Neutro Abs: 1.2 10*3/uL — ABNORMAL LOW (ref 1.7–7.7)
Neutrophils Relative %: 52 %
Platelets: 92 10*3/uL — ABNORMAL LOW (ref 150–400)
RBC: 2.73 MIL/uL — ABNORMAL LOW (ref 4.22–5.81)
RDW: 14.2 % (ref 11.5–15.5)
Smear Review: NORMAL
WBC: 2.2 10*3/uL — ABNORMAL LOW (ref 4.0–10.5)
nRBC: 0 % (ref 0.0–0.2)

## 2020-06-16 LAB — PHOSPHORUS: Phosphorus: 3.2 mg/dL (ref 2.5–4.6)

## 2020-06-16 LAB — GLUCOSE, CAPILLARY
Glucose-Capillary: 99 mg/dL (ref 70–99)
Glucose-Capillary: 124 mg/dL — ABNORMAL HIGH (ref 70–99)
Glucose-Capillary: 179 mg/dL — ABNORMAL HIGH (ref 70–99)
Glucose-Capillary: 184 mg/dL — ABNORMAL HIGH (ref 70–99)

## 2020-06-16 LAB — C-REACTIVE PROTEIN: CRP: 2.5 mg/dL — ABNORMAL HIGH (ref <1.0)

## 2020-06-16 LAB — RETICULOCYTES
Immature Retic Fract: 13.3 % (ref 2.3–15.9)
RBC.: 2.71 MIL/uL — ABNORMAL LOW (ref 4.22–5.81)
Retic Count, Absolute: 24.9 10*3/uL (ref 19.0–186.0)
Retic Ct Pct: 0.9 % (ref 0.4–3.1)

## 2020-06-16 LAB — FERRITIN: Ferritin: 255 ng/mL (ref 24–336)

## 2020-06-16 LAB — MAGNESIUM: Magnesium: 2.1 mg/dL (ref 1.7–2.4)

## 2020-06-16 LAB — FIBRIN DERIVATIVES D-DIMER (ARMC ONLY): Fibrin derivatives D-dimer (ARMC): 698.66 ng/mL (FEU) — ABNORMAL HIGH (ref 0.00–499.00)

## 2020-06-16 LAB — VITAMIN B12: Vitamin B-12: 927 pg/mL — ABNORMAL HIGH (ref 180–914)

## 2020-06-16 LAB — FOLATE: Folate: 18.3 ng/mL (ref >5.9)

## 2020-06-16 MED ORDER — PAROXETINE HCL 10 MG PO TABS
10.0000 mg | ORAL_TABLET | Freq: Every day | ORAL | Status: DC
  Administered 2020-06-16 – 2020-06-18 (×3): 10 mg via ORAL
  Filled 2020-06-16 (×3): qty 1

## 2020-06-16 MED ORDER — LOSARTAN POTASSIUM 25 MG PO TABS
25.0000 mg | ORAL_TABLET | Freq: Every day | ORAL | Status: DC
  Administered 2020-06-17 – 2020-06-18 (×2): 25 mg via ORAL
  Filled 2020-06-16 (×2): qty 1

## 2020-06-16 MED ORDER — ALPRAZOLAM 0.25 MG PO TABS
0.2500 mg | ORAL_TABLET | Freq: Every evening | ORAL | Status: DC | PRN
  Administered 2020-06-16 – 2020-06-17 (×2): 0.25 mg via ORAL
  Filled 2020-06-16 (×2): qty 1

## 2020-06-16 MED ORDER — TRAMADOL HCL 50 MG PO TABS: 50.0000 mg | ORAL_TABLET | Freq: Three times a day (TID) | ORAL | Status: DC | PRN

## 2020-06-16 NOTE — Progress Notes (Signed)
PROGRESS NOTE    Luis Brood.  VGK:[id number] DOB: 09/06/1946 DOA: 06/14/2020 PCP: Tracie Harrier, MD   Brief Narrative:  Luis Brood. is a 74 y.o. male with medical history significant for multiple myeloma on Revlimid followed by Dr. Mike Gip, DM, HTN, CAD status post CABG followed by Dr. Nehemiah Massed, anxiety and depression who presents to the emergency room with a 5-day history of protracted weakness, cough and chest pain.  Patient still grieving the loss of his wife, who died in 10/13/19 secondary to brain aneurysm rupture.  Patient is vaccinated with [place] in March 2021.  Over the weekend he had a company of his unvaccinated brother who was coughing and subsequently diagnosed with Covid. Covid PCR came back positive.  Chest x-ray with no active disease.  He was started on remdesivir. No Hypoxia.  Subjective: Patient was complaining of some lethargy and unable to sleep last night due to n noisy room.  No shortness of breath.  He wants to complete his remdesivir while in the hospital as he lives alone.  Assessment & Plan:   Principal Problem:   Generalized weakness Active Problems:   Multiple myeloma (HCC)   Obstructive apnea   Type 2 diabetes mellitus without complication, without long-term current use of insulin (HCC)   CAD (coronary artery disease) of artery bypass graft   Maintenance chemotherapy   COVID-19 virus infection (vaccinated Pfizer 01/2020)   COVID-19  Breakthrough COVID-19 virus infection (vaccinated [employer] 01/2020). Mildly elevated inflammatory markers which are improving now.  Patient was started on remdesivir -day 3 today.  No hypoxia or chest x-ray changes. -Continue to monitor inflammatory markers-if continue to improve we can stop remdesivir earlier. -continue Decadron-day 3 -Continue supportive care. -Would have been a good candidate for monoclonal antibodies.  Pancytopenia.  Patient developed pancytopenia most likely secondary to COVID-19  infection.  Cell count started improving today.  Hemoglobin at 10.3. -Check anemia panel. -Continue to monitor  Physical deconditioning.  Most likely secondary to COVID-19 infection.  Patient was feeling very fatigued and lethargic.  Patient lives alone. -We will get PT/OT evaluation.  Hypertension.  Blood pressure mildly elevated this morning. -Start home dose of losartan. -Continue holding propranolol due to heart rate in 50s and 60s.  Multiple myeloma (HCC) -No acute issues identified. Patient follows with Dr. Mike Gip  Type 2 diabetes mellitus without complication, without long-term current use of insulin (HCC) CBG within goal most of the time -Subcutaneous insulin coverage per sliding scale  CAD (coronary artery disease) of artery bypass graft.  No chest pain -Continue home meds   Objective: Vitals:   06/16/20 0448 06/16/20 0806 06/16/20 0947 06/16/20 1121  BP: (!) 151/57 127/66  (!) 122/93  Pulse: 66 69  65  Resp: _0 Temp: 98.3 F (36.8 C) 97.7 F (36.5 C)  97.8 F (36.6 C)  TempSrc: Oral     SpO2: 93% 94% (!) 88% 93%  Weight:      Height:        Intake/Output Summary (Last 24 hours) at 06/16/2020 1343 Last data filed at 06/16/2020 0500 Gross per 24 hour  Intake 200.31 ml  Output 450 ml  Net -249.69 ml   Filed Weights   06/13/20 1557  Weight: 81.6 kg    Examination:  General.  Well-developed, pleasant elderly man, in no acute distress. Pulmonary.  Lungs clear bilaterally, normal respiratory effort. CV.  Regular rate and rhythm, no JVD, rub or murmur. Abdomen.  Soft, nontender,  nondistended, BS positive. CNS.  Alert and oriented x3.  No focal neurologic deficit. Extremities.  No edema, no cyanosis, pulses intact and symmetrical. Psychiatry.  Judgment and insight appears normal.  DVT prophylaxis: Lovenox Code Status: Full Family Communication: Discussed with patient. Disposition Plan:  Status is: Inpatient  Remains inpatient appropriate  because:Inpatient level of care appropriate due to severity of illness   Dispo: The patient is from: Home              Anticipated d/c is to: Home              Anticipated d/c date is: 1 day              Patient currently is not medically stable to d/c.    Consultants:   None  Procedures:  Antimicrobials:   Data Reviewed: I have personally reviewed following labs and imaging studies  CBC: Recent Labs  Lab 06/13/20 1604 06/15/20 0425 06/16/20 0602  WBC 2.6* 1.4* 2.2*  NEUTROABS  --  0.7* 1.2*  HGB 11.6* 11.1* 10.3*  HCT 33.3* 31.7* 29.1*  MCV 106.4* 106.7* 106.6*  PLT 97* 75* 92*   Basic Metabolic Panel: Recent Labs  Lab 06/13/20 1604 06/15/20 0425 06/16/20 0602  NA 137 137 136  K 3.8 4.2 4.0  CL 105 109 108  CO2 22 19* 19*  GLUCOSE 111* 152* 105*  BUN 22 28* 29*  CREATININE 1.15 0.93 1.01  CALCIUM 8.2* 7.6* 7.6*  MG  --  1.4* 2.1  PHOS  --  3.1 3.2   GFR: Estimated Creatinine Clearance: 64.6 mL/min (by C-G formula based on SCr of 1.01 mg/dL). Liver Function Tests: Recent Labs  Lab 06/15/20 0425 06/16/20 0602  AST 27 36  ALT 16 18  ALKPHOS 57 53  BILITOT 0.8 0.8  PROT 7.2 6.8  ALBUMIN 2.6* 2.7*   No results for input(s): LIPASE, AMYLASE in the last 168 hours. No results for input(s): AMMONIA in the last 168 hours. Coagulation Profile: No results for input(s): INR, PROTIME in the last 168 hours. Cardiac Enzymes: No results for input(s): CKTOTAL, CKMB, CKMBINDEX, TROPONINI in the last 168 hours. BNP (last 3 results) No results for input(s): PROBNP in the last 8760 hours. HbA1C: No results for input(s): HGBA1C in the last 72 hours. CBG: Recent Labs  Lab 06/15/20 1214 06/15/20 1716 06/15/20 2022 06/16/20 0803 06/16/20 1120  GLUCAP 161* 192* 143* 99 124*   Lipid Profile: No results for input(s): CHOL, HDL, LDLCALC, TRIG, CHOLHDL, LDLDIRECT in the last 72 hours. Thyroid Function Tests: No results for input(s): TSH, T4TOTAL, FREET4, T3FREE,  THYROIDAB in the last 72 hours. Anemia Panel: Recent Labs    06/15/20 0425 06/16/20 0602  VITAMINB12  --  927*  FOLATE  --  18.3  FERRITIN 319 255  TIBC  --  182*  IRON  --  108  RETICCTPCT  --  0.9   Sepsis Labs: Recent Labs  Lab 06/14/20 0356  PROCALCITON <0.10    Recent Results (from the past 240 hour(s))  SARS Coronavirus 2 by RT PCR (hospital order, performed in [facility] hospital lab) Nasopharyngeal Nasopharyngeal Swab     Status: Abnormal   Collection Time: 06/13/20  4:03 PM   Specimen: Nasopharyngeal Swab  Result Value Ref Range Status   SARS Coronavirus 2 POSITIVE (A) NEGATIVE Final    Comment: RESULT CALLED TO, READ BACK BY AND VERIFIED WITH: VALERIE CHANDLER 06/13/20 AT 3149 BY ACR (NOTE) SARS-CoV-2 target nucleic acids are  DETECTED  SARS-CoV-2 RNA is generally detectable in upper respiratory specimens  during the acute phase of infection.  Positive results are indicative  of the presence of the identified virus, but do not rule out bacterial infection or co-infection with other pathogens not detected by the test.  Clinical correlation with patient history and  other diagnostic information is necessary to determine patient infection status.  The expected result is negative.  Fact Sheet for Patients:   StrictlyIdeas.no   Fact Sheet for Healthcare Providers:   BankingDealers.co.za    This test is not yet approved or cleared by the [place] FDA and  has been authorized for detection and/or diagnosis of SARS-CoV-2 by FDA under an Emergency Use Authorization (EUA).  This EUA will remain in effect (meaning thi s test can be used) for the duration of  the COVID-19 declaration under Section 564(b)(1) of the Act, 21 U.S.C. section 360-bbb-3(b)(1), unless the authorization is terminated or revoked sooner.  Performed at [facility], [address] [place], [place] [address]      Radiology  Studies: No results found.  Scheduled Meds: . albuterol  2 puff Inhalation Q6H  . vitamin C  500 mg Oral Daily  . dexamethasone  6 mg Oral Daily  . enoxaparin (LOVENOX) injection  40 mg Subcutaneous Q24H  . insulin aspart  0-15 Units Subcutaneous TID WC  . insulin aspart  0-5 Units Subcutaneous QHS  . insulin detemir  0.075 Units/kg Subcutaneous BID  . linagliptin  5 mg Oral Daily  . [START ON 06/17/2020] losartan  25 mg Oral Daily  . multivitamin with minerals  1 tablet Oral Daily  . PARoxetine  10 mg Oral Daily  . zinc sulfate  220 mg Oral Daily   Continuous Infusions: . remdesivir 100 mg in NS 100 mL 100 mg (06/16/20 1004)     LOS: 2 days   Time spent: 30 minutes.  Lorella Nimrod, MD [facility]  If 7PM-7AM, please contact night-coverage [email or web address]  06/16/2020, 1:43 PM   This record has been created using Systems analyst. Errors have been sought and corrected,but may not always be located. Such creation errors do not reflect on the standard of care.

## 2020-06-17 LAB — COMPREHENSIVE METABOLIC PANEL
ALT: 22 U/L (ref 0–44)
AST: 36 U/L (ref 15–41)
Albumin: 2.7 g/dL — ABNORMAL LOW (ref 3.5–5.0)
Alkaline Phosphatase: 54 U/L (ref 38–126)
Anion gap: 10 (ref 5–15)
BUN: 26 mg/dL — ABNORMAL HIGH (ref 8–23)
CO2: 19 mmol/L — ABNORMAL LOW (ref 22–32)
Calcium: 7.8 mg/dL — ABNORMAL LOW (ref 8.9–10.3)
Chloride: 106 mmol/L (ref 98–111)
Creatinine, Ser: 0.93 mg/dL (ref 0.61–1.24)
GFR calc Af Amer: >60 mL/min (ref >60)
GFR calc non Af Amer: >60 mL/min (ref >60)
Glucose, Bld: 142 mg/dL — ABNORMAL HIGH (ref 70–99)
Potassium: 4 mmol/L (ref 3.5–5.1)
Sodium: 135 mmol/L (ref 135–145)
Total Bilirubin: 0.9 mg/dL (ref 0.3–1.2)
Total Protein: 7.4 g/dL (ref 6.5–8.1)

## 2020-06-17 LAB — MAGNESIUM: Magnesium: 2.3 mg/dL (ref 1.7–2.4)

## 2020-06-17 LAB — FERRITIN: Ferritin: 234 ng/mL (ref 24–336)

## 2020-06-17 LAB — GLUCOSE, CAPILLARY
Glucose-Capillary: 143 mg/dL — ABNORMAL HIGH (ref 70–99)
Glucose-Capillary: 176 mg/dL — ABNORMAL HIGH (ref 70–99)
Glucose-Capillary: 178 mg/dL — ABNORMAL HIGH (ref 70–99)
Glucose-Capillary: 220 mg/dL — ABNORMAL HIGH (ref 70–99)

## 2020-06-17 LAB — CBC WITH DIFFERENTIAL/PLATELET
Abs Immature Granulocytes: 0.02 10*3/uL (ref 0.00–0.07)
Basophils Absolute: 0 10*3/uL (ref 0.0–0.1)
Basophils Relative: 0 %
Eosinophils Absolute: 0 10*3/uL (ref 0.0–0.5)
Eosinophils Relative: 0 %
HCT: 30.6 % — ABNORMAL LOW (ref 39.0–52.0)
Hemoglobin: 11 g/dL — ABNORMAL LOW (ref 13.0–17.0)
Immature Granulocytes: 1 %
Lymphocytes Relative: 29 %
Lymphs Abs: 0.8 10*3/uL (ref 0.7–4.0)
MCH: 36.9 pg — ABNORMAL HIGH (ref 26.0–34.0)
MCHC: 35.9 g/dL (ref 30.0–36.0)
MCV: 102.7 fL — ABNORMAL HIGH (ref 80.0–100.0)
Monocytes Absolute: 0.5 10*3/uL (ref 0.1–1.0)
Monocytes Relative: 18 %
Neutro Abs: 1.4 10*3/uL — ABNORMAL LOW (ref 1.7–7.7)
Neutrophils Relative %: 52 %
Platelets: 131 10*3/uL — ABNORMAL LOW (ref 150–400)
RBC: 2.98 MIL/uL — ABNORMAL LOW (ref 4.22–5.81)
RDW: 13.7 % (ref 11.5–15.5)
WBC: 2.6 10*3/uL — ABNORMAL LOW (ref 4.0–10.5)
nRBC: 0 % (ref 0.0–0.2)

## 2020-06-17 LAB — C-REACTIVE PROTEIN: CRP: 1.8 mg/dL — ABNORMAL HIGH (ref <1.0)

## 2020-06-17 LAB — PHOSPHORUS: Phosphorus: 3 mg/dL (ref 2.5–4.6)

## 2020-06-17 LAB — D-DIMER, QUANTITATIVE: D-Dimer, Quant: 0.74 ug/mL-FEU — ABNORMAL HIGH (ref 0.00–0.50)

## 2020-06-17 NOTE — TOC Initial Note (Signed)
Transition of Care (TOC) - Initial/Assessment Note    Patient Details  Name: Luis [name]. MRN: [id number] Date of Birth: 09-20-1946  Transition of Care [facility]) CM/SW Contact:    Shelbie Hutching, RN Phone Number: 06/17/2020, 1:29 PM  Clinical Narrative:                 Patient admitted to the hospital with COVID, patient will potentially be discharged tomorrow once remdesivir has been completed.  Patient reports that he lives alone and is independent and drives.  Patient has a cane and walker, uses the cane.  Patient has a CPAP that is for night use, although he reports he has not been using his CPAP for about a year now due to him not being able to sleep in the bed, he has been sleeping in his chair because of back pain.  Patient reports that he just started sleeping in his bed again and plans on using his CPAP from now on.   Patient is current with his PCP, Dr. Ginette Pitman [facility] [place]  Patient agrees to home health services.  Referral has been accepted by [facility] for RN, PT, and OT.  Patient reports that at discharge his neighbor will probably be able to pick him up.   Expected Discharge Plan: [place] Barriers to Discharge: Continued Medical Work up   Patient Goals and CMS Choice Patient states their goals for this hospitalization and ongoing recovery are:: for his coughing to get better and to not be contagious CMS Medicare.gov Compare Post Acute Care list provided to:: Patient Choice offered to / list presented to : Patient  Expected Discharge Plan and Services Expected Discharge Plan: [place]   Discharge Planning Services: CM Consult Post Acute Care Choice: [place] arrangements for the past 2 months: [place] Arranged: RN, PT, OT [facility] Agency: [place] ([place]) Date [place]: 06/17/20 Time [place]:  1115 Representative spoke with at [place]: Floydene Flock  Prior Living Arrangements/Services Living arrangements for the past 2 months: [place] with:: Self Patient language and need for interpreter reviewed:: Yes Do you feel safe going back to the place where you live?: Yes      Need for Family Participation in Patient Care: Yes (Comment) (COVID) Care giver support system in place?: Yes (comment) (sons and neighbor) Current home services: DME (walker and cane) Criminal Activity/Legal Involvement Pertinent to Current Situation/Hospitalization: No - Comment as needed  Activities of Daily Living Home Assistive Devices/Equipment: Eyeglasses, Cane (specify quad or straight), Dentures (specify type) (straight cane) ADL Screening (condition at time of admission) Patient's cognitive ability adequate to safely complete daily activities?: Yes Is the patient deaf or have difficulty hearing?: No Does the patient have difficulty seeing, even when wearing glasses/contacts?: No Does the patient have difficulty concentrating, remembering, or making decisions?: No Patient able to express need for assistance with ADLs?: No Does the patient have difficulty dressing or bathing?: No Independently performs ADLs?: Yes (appropriate for developmental age) Does the patient have difficulty walking or climbing stairs?: No (w/ use of cane) Weakness of Legs: Both Weakness of Arms/Hands: None  Permission Sought/Granted Permission sought to share information with : Case Manager, Other (comment) Permission granted  to share information with : Yes, Verbal Permission Granted     Permission granted to share info w AGENCY: [facility]        Emotional Assessment   Attitude/Demeanor/Rapport: Engaged Affect (typically observed): Accepting Orientation: : Oriented to Self, Oriented to Place, Oriented to  Time, Oriented to Situation Alcohol / Substance Use: Not Applicable Psych Involvement:  No (comment)  Admission diagnosis:  Cough [R05] Generalized weakness [R53.1] Other fatigue [R53.83] COVID-19 [U07.1] Patient Active Problem List   Diagnosis Date Noted  . Maintenance chemotherapy 06/14/2020  . COVID-19 virus infection (vaccinated Pfizer 01/2020) 06/14/2020  . COVID-19 06/14/2020  . Weight loss 11/22/2019  . Compression fracture of body of thoracic vertebra ([place]) 09/19/2019  . Macrocytic anemia 03/25/2019  . Renal insufficiency 03/25/2019  . Abnormal iron saturation 02/18/2019  . Bilateral carotid artery stenosis 03/21/2018  . Connective tissue and disc stenosis of intervertebral foramina of lumbar region 02/06/2018  . Hip strain, initial encounter 02/06/2018  . History of total right hip replacement 02/06/2018  . Obesity (BMI 30-39.9) 02/06/2018  . Goals of care, counseling/discussion 11/18/2017  . Drug-induced neutropenia ([place]) 01/23/2017  . Hypocalcemia 06/28/2016  . Type 2 diabetes mellitus without complication, without long-term current use of insulin ([place]) 02/17/2016  . Arthritis 05/01/2015  . TI (tricuspid incompetence) 04/16/2015  . Benign essential HTN 04/04/2015  . B12 deficiency 03/31/2015  . Diarrhea 03/31/2015  . Multiple myeloma ([place]) 03/27/2015  . Hypertensive pulmonary vascular disease ([place]) 09/17/2014  . MI (mitral incompetence) 09/17/2014  . Obstructive apnea 09/17/2014  . Neuritis or radiculitis due to rupture of lumbar intervertebral disc 08/27/2014  . Lumbar canal stenosis 08/27/2014  . Degenerative arthritis of lumbar spine 08/27/2014  . Degenerative arthritis of hip 08/27/2014  . Arthritis, degenerative 03/28/2014  . Chest pain 03/14/2014  . Generalized weakness 03/14/2014  . Breath shortness 03/14/2014  . 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 01/11/2014  . Combined fat and carbohydrate induced hyperlipemia 01/11/2014  . Compulsive tobacco user syndrome 01/11/2014  . CAD (coronary artery disease) of artery bypass graft  01/11/2014   PCP:  Tracie Harrier, MD [id number] [address]: [phone]: [phone] [facility] [facility] [id number]: [phone]: [phone]    Social Determinants of Health (SDOH) Interventions    Readmission Risk Interventions No flowsheet data found.

## 2020-06-17 NOTE — Care Management Important Message (Signed)
Important Message  Patient Details  Name: Luis [name]. MRN: [id number] Date of Birth: 1945/11/16   Medicare Important Message Given:  Yes  Reviewed with patient via room phone due to isolation status.  Declined receiving copy of Medicare IM at this time.  Copy should be mailed to patient's home address by Admitting.     Dannette Barbara 06/17/2020, 11:53 AM

## 2020-06-17 NOTE — Progress Notes (Signed)
PROGRESS NOTE    Luis [name].  ZDG:[id number] DOB: October 18, 1946 DOA: 06/14/2020 PCP: Tracie Harrier, MD   Brief Narrative:  Luis [name]. is a 74 y.o. male with medical history significant for multiple myeloma on Revlimid followed by Dr. Mike Gip, DM, HTN, CAD status post CABG followed by Dr. Nehemiah Massed, anxiety and depression who presents to the emergency room with a 5-day history of protracted weakness, cough and chest pain.  Patient still grieving the loss of his wife, who died in 10/17/2019 secondary to brain aneurysm rupture.  Patient is vaccinated with [place] in March 2021.  Over the weekend he had a company of his unvaccinated brother who was coughing and subsequently diagnosed with Covid. Covid PCR came back positive.  Chest x-ray with no active disease.  He was started on remdesivir. No Hypoxia.  Subjective: Patient has no new complaint today.  Assessment & Plan:   Principal Problem:   Generalized weakness Active Problems:   Multiple myeloma (HCC)   Obstructive apnea   Type 2 diabetes mellitus without complication, without long-term current use of insulin (HCC)   CAD (coronary artery disease) of artery bypass graft   Maintenance chemotherapy   COVID-19 virus infection (vaccinated Pfizer 01/2020)   COVID-19  Breakthrough COVID-19 virus infection (vaccinated [place] 01/2020). Mildly elevated inflammatory markers which are improving now.  Patient was started on remdesivir -day 4 today.  No hypoxia or chest x-ray changes. -Continue to monitor inflammatory markers-improved. -continue Decadron-day 4 -Continue supportive care. -Would have been a good candidate for monoclonal antibodies.  Pancytopenia.  Patient developed pancytopenia most likely secondary to COVID-19 infection.  Cell count started improving .  Hemoglobin at 11. -Check anemia panel. -Continue to monitor  Physical deconditioning.  Most likely secondary to COVID-19 infection.  Patient was feeling very  fatigued and lethargic.  Patient lives alone. - PT/OT evaluation-recommending home health services which were ordered.  Hypertension.  Blood pressure within goal. -Continue home dose of losartan. -Continue holding propranolol due to heart rate in 50s and 60s.  Multiple myeloma (HCC) -No acute issues identified. Patient follows with Dr. Mike Gip  Type 2 diabetes mellitus without complication, without long-term current use of insulin (HCC) CBG within goal most of the time -Subcutaneous insulin coverage per sliding scale  CAD (coronary artery disease) of artery bypass graft.  No chest pain -Continue home meds   Objective: Vitals:   06/17/20 0003 06/17/20 0425 06/17/20 0803 06/17/20 1237  BP:  102/60 110/77 118/66  Pulse: (!) 55 64 99 89  Resp: '20 18 18 18  ' Temp: 98.6 F (37 C) 98.2 F (36.8 C) 98.1 F (36.7 C) 98.1 F (36.7 C)  TempSrc: Oral Oral Oral Oral  SpO2: 94% 90% 91% 95%  Weight:      Height:        Intake/Output Summary (Last 24 hours) at 06/17/2020 1549 Last data filed at 06/17/2020 1100 Gross per 24 hour  Intake 200 ml  Output 150 ml  Net 50 ml   Filed Weights   06/13/20 1557  Weight: 81.6 kg    Examination:  General.  Well-developed elderly man, in no acute distress. Pulmonary.  Lungs clear bilaterally, normal respiratory effort. CV.  Regular rate and rhythm, no JVD, rub or murmur. Abdomen.  Soft, nontender, nondistended, BS positive. CNS.  Alert and oriented x3.  No focal neurologic deficit. Extremities.  No edema, no cyanosis, pulses intact and symmetrical. Psychiatry.  Judgment and insight appears normal.  DVT prophylaxis: Lovenox Code Status:  Full Family Communication: Discussed with patient. Disposition Plan:  Status is: Inpatient  Remains inpatient appropriate because:Inpatient level of care appropriate due to severity of illness   Dispo: The patient is from: Home              Anticipated d/c is to: Home              Anticipated  d/c date is: 1 day              Patient currently is not medically stable to d/c.  Patient will complete remdesivir treatment tomorrow.    Consultants:   None  Procedures:  Antimicrobials:   Data Reviewed: I have personally reviewed following labs and imaging studies  CBC: Recent Labs  Lab 06/13/20 1604 06/15/20 0425 06/16/20 0602 06/17/20 0657  WBC 2.6* 1.4* 2.2* 2.6*  NEUTROABS  --  0.7* 1.2* 1.4*  HGB 11.6* 11.1* 10.3* 11.0*  HCT 33.3* 31.7* 29.1* 30.6*  MCV 106.4* 106.7* 106.6* 102.7*  PLT 97* 75* 92* 287*   Basic Metabolic Panel: Recent Labs  Lab 06/13/20 1604 06/15/20 0425 06/16/20 0602 06/17/20 0657  NA 137 137 136 135  K 3.8 4.2 4.0 4.0  CL 105 109 108 106  CO2 22 19* 19* 19*  GLUCOSE 111* 152* 105* 142*  BUN 22 28* 29* 26*  CREATININE 1.15 0.93 1.01 0.93  CALCIUM 8.2* 7.6* 7.6* 7.8*  MG  --  1.4* 2.1 2.3  PHOS  --  3.1 3.2 3.0   GFR: Estimated Creatinine Clearance: 70.2 mL/min (by C-G formula based on SCr of 0.93 mg/dL). Liver Function Tests: Recent Labs  Lab 06/15/20 0425 06/16/20 0602 06/17/20 0657  AST 27 36 36  ALT '16 18 22  ' ALKPHOS 57 53 54  BILITOT 0.8 0.8 0.9  PROT 7.2 6.8 7.4  ALBUMIN 2.6* 2.7* 2.7*   No results for input(s): LIPASE, AMYLASE in the last 168 hours. No results for input(s): AMMONIA in the last 168 hours. Coagulation Profile: No results for input(s): INR, PROTIME in the last 168 hours. Cardiac Enzymes: No results for input(s): CKTOTAL, CKMB, CKMBINDEX, TROPONINI in the last 168 hours. BNP (last 3 results) No results for input(s): PROBNP in the last 8760 hours. HbA1C: No results for input(s): HGBA1C in the last 72 hours. CBG: Recent Labs  Lab 06/16/20 1120 06/16/20 1614 06/16/20 2151 06/17/20 0800 06/17/20 1218  GLUCAP 124* 179* 184* 143* 176*   Lipid Profile: No results for input(s): CHOL, HDL, LDLCALC, TRIG, CHOLHDL, LDLDIRECT in the last 72 hours. Thyroid Function Tests: No results for input(s): TSH,  T4TOTAL, FREET4, T3FREE, THYROIDAB in the last 72 hours. Anemia Panel: Recent Labs    06/16/20 0602 06/17/20 0657  VITAMINB12 927*  --   FOLATE 18.3  --   FERRITIN 255 234  TIBC 182*  --   IRON 108  --   RETICCTPCT 0.9  --    Sepsis Labs: Recent Labs  Lab 06/14/20 0356  PROCALCITON <0.10    Recent Results (from the past 240 hour(s))  SARS Coronavirus 2 by RT PCR (hospital order, performed in [facility] hospital lab) Nasopharyngeal Nasopharyngeal Swab     Status: Abnormal   Collection Time: 06/13/20  4:03 PM   Specimen: Nasopharyngeal Swab  Result Value Ref Range Status   SARS Coronavirus 2 POSITIVE (A) NEGATIVE Final    Comment: RESULT CALLED TO, READ BACK BY AND VERIFIED WITH: VALERIE CHANDLER 06/13/20 AT 8676 BY ACR (NOTE) SARS-CoV-2 target nucleic acids are DETECTED  SARS-CoV-2 RNA is generally detectable in upper respiratory specimens  during the acute phase of infection.  Positive results are indicative  of the presence of the identified virus, but do not rule out bacterial infection or co-infection with other pathogens not detected by the test.  Clinical correlation with patient history and  other diagnostic information is necessary to determine patient infection status.  The expected result is negative.  Fact Sheet for Patients:   StrictlyIdeas.no   Fact Sheet for Healthcare Providers:   BankingDealers.co.za    This test is not yet approved or cleared by the [place] FDA and  has been authorized for detection and/or diagnosis of SARS-CoV-2 by FDA under an Emergency Use Authorization (EUA).  This EUA will remain in effect (meaning thi s test can be used) for the duration of  the COVID-19 declaration under Section 564(b)(1) of the Act, 21 U.S.C. section 360-bbb-3(b)(1), unless the authorization is terminated or revoked sooner.  Performed at [facility], [address], [place], [place] [address]        Radiology Studies: No results found.  Scheduled Meds:  albuterol  2 puff Inhalation Q6H   vitamin C  500 mg Oral Daily   dexamethasone  6 mg Oral Daily   enoxaparin (LOVENOX) injection  40 mg Subcutaneous Q24H   insulin aspart  0-15 Units Subcutaneous TID WC   insulin aspart  0-5 Units Subcutaneous QHS   insulin detemir  0.075 Units/kg Subcutaneous BID   linagliptin  5 mg Oral Daily   losartan  25 mg Oral Daily   multivitamin with minerals  1 tablet Oral Daily   PARoxetine  10 mg Oral Daily   zinc sulfate  220 mg Oral Daily   Continuous Infusions:  remdesivir 100 mg in NS 100 mL 100 mg (06/17/20 0916)     LOS: 3 days   Time spent: 30 minutes.  Lorella Nimrod, MD [facility]  If 7PM-7AM, please contact night-coverage [email or web address]  06/17/2020, 3:49 PM   This record has been created using Systems analyst. Errors have been sought and corrected,but may not always be located. Such creation errors do not reflect on the standard of care.

## 2020-06-17 NOTE — Evaluation (Signed)
Physical Therapy Evaluation Patient Details Name: Luis [name]. MRN: [id number] DOB: 07-16-1946 Today's Date: 06/17/2020   History of Present Illness  presented to ER secondary to generalized weakness, cough and chest pain; admitted for management of COVID-19 virus infection.  Clinical Impression  Upon evaluation, patient alert and oriented; follows commands and agreeable to mobility tasks.  Denies pain and endorses generalized improvement in strength, breathing since admission.  Significant forward head posturing noted; baseline deficits in shoulder elevation (chronic, arthritic changes).  Otherwise, strength and ROM grossly symmetrical and WFL for basic transfers and mobility.  Able to complete bed mobility with mod indep; sit/stand, basic transfers and gait (90') with SPC, cga/close sup.  Demonstrates reciprocal stepping pattern with fair step height/length; significant rounded trunk, forward head posturing; manages turns and obstacles without LOB. Mild SOB with exertion, sats 89-90% on RA throughout. Good self-awareness of signs/symptoms of fatigue, self-initiating rest periods and graded activity as appropriate Would benefit from skilled PT to address above deficits and promote optimal return to PLOF.; Recommend transition to HHPT upon discharge from acute hospitalization.     Follow Up Recommendations Home health PT    Equipment Recommendations       Recommendations for Other Services       Precautions / Restrictions Precautions Precautions: Fall Restrictions Weight Bearing Restrictions: No      Mobility  Bed Mobility Overal bed mobility: Modified Independent                Transfers Overall transfer level: Needs assistance Equipment used: Straight cane Transfers: Sit to/from Stand Sit to Stand: Min guard;Supervision            Ambulation/Gait Ambulation/Gait assistance: Supervision;Min guard Gait Distance (Feet): 90 Feet Assistive device: Straight  cane       General Gait Details: reciprocal stepping pattern with fair step height/length; significant rounded trunk, forward head posturing; manages turns and obstacles without LOB. Mild SOB with exertion, sats 89-90% on RA throughout. Good self-awareness of signs/symptoms of fatigue, self-initiating rest periods and graded activity as appropriate  Stairs            Wheelchair Mobility    Modified Rankin (Stroke Patients Only)       Balance Overall balance assessment: Needs assistance Sitting-balance support: No upper extremity supported;Feet supported Sitting balance-Leahy Scale: Good     Standing balance support: Bilateral upper extremity supported Standing balance-Leahy Scale: Fair                               Pertinent Vitals/Pain Pain Assessment: No/denies pain    Home Living Family/patient expects to be discharged to:: Private residence Living Arrangements: Alone Available Help at Discharge: Family;Available PRN/intermittently Type of Home: House       Home Layout: One level Home Equipment: Cane - single point      Prior Function Level of Independence: Independent with assistive device(s)         Comments: Mod indep with SPC for ADLs, household and community mobilization; denies fall history; + driving     Hand Dominance        Extremity/Trunk Assessment   Upper Extremity Assessment Upper Extremity Assessment: Overall WFL for tasks assessed (significant arthritic limitations to bilat shoulders; otherwise, grossly WFL, at leas 4/5 throughout)    Lower Extremity Assessment Lower Extremity Assessment: Overall WFL for tasks assessed (grossly at least 4/5 throughout)       Communication   Communication:  No difficulties  Cognition Arousal/Alertness: Awake/alert Behavior During Therapy: WFL for tasks assessed/performed Overall Cognitive Status: Within Functional Limits for tasks assessed                                         General Comments      Exercises Other Exercises Other Exercises: 5x sit/stand without assist device, 17 seconds; does require use of UEs on armrests of the chair to complete.  If attempting from surface without armrests, requries mutliple attempts to complete each stand and requires significant increase in time.   Assessment/Plan    PT Assessment Patient needs continued PT services  PT Problem List Decreased activity tolerance;Decreased balance;Decreased mobility;Cardiopulmonary status limiting activity       PT Treatment Interventions DME instruction;Gait training;Functional mobility training;Therapeutic activities;Therapeutic exercise;Balance training;Patient/family education    PT Goals (Current goals can be found in the Care Plan section)  Acute Rehab PT Goals Patient Stated Goal: to get better and stronger PT Goal Formulation: With patient Time For Goal Achievement: 07/01/20 Potential to Achieve Goals: Good    Frequency Min 2X/week   Barriers to discharge Decreased caregiver support      Co-evaluation               AM-PAC PT "6 Clicks" Mobility  Outcome Measure Help needed turning from your back to your side while in a flat bed without using bedrails?: None Help needed moving from lying on your back to sitting on the side of a flat bed without using bedrails?: None Help needed moving to and from a bed to a chair (including a wheelchair)?: None Help needed standing up from a chair using your arms (e.g., wheelchair or bedside chair)?: A Little Help needed to walk in hospital room?: A Little Help needed climbing 3-5 steps with a railing? : A Little 6 Click Score: 21    End of Session Equipment Utilized During Treatment: Gait belt Activity Tolerance: Patient tolerated treatment well Patient left: in chair;with call bell/phone within reach;with chair alarm set Nurse Communication: Mobility status PT Visit Diagnosis: Muscle weakness (generalized)  (M62.81)    Time: 1523-1600 PT Time Calculation (min) (ACUTE ONLY): 37 min   Charges:   PT Evaluation $PT Eval Moderate Complexity: 1 Mod PT Treatments $Therapeutic Activity: 8-22 mins       Siddiq Kaluzny H. Owens Shark, PT, DPT, NCS 06/17/20, 10:33 PM [phone]

## 2020-06-17 NOTE — Progress Notes (Signed)
Called and updated Finas Delone (son) all questions answered. Shanon Brow would like discharging nurse to call him and review AVS with him before patient leaves.

## 2020-06-18 ENCOUNTER — Inpatient Hospital Stay: Payer: Medicare Other

## 2020-06-18 ENCOUNTER — Inpatient Hospital Stay: Payer: Medicare Other | Attending: Hematology and Oncology | Admitting: Hematology and Oncology

## 2020-06-18 LAB — FIBRIN DERIVATIVES D-DIMER (ARMC ONLY): Fibrin derivatives D-dimer (ARMC): 564.71 ng/mL (FEU) — ABNORMAL HIGH (ref 0.00–499.00)

## 2020-06-18 LAB — CBC WITH DIFFERENTIAL/PLATELET
Abs Immature Granulocytes: 0.01 10*3/uL (ref 0.00–0.07)
Basophils Absolute: 0 10*3/uL (ref 0.0–0.1)
Basophils Relative: 0 %
Eosinophils Absolute: 0 10*3/uL (ref 0.0–0.5)
Eosinophils Relative: 0 %
HCT: 32.1 % — ABNORMAL LOW (ref 39.0–52.0)
Hemoglobin: 11.8 g/dL — ABNORMAL LOW (ref 13.0–17.0)
Immature Granulocytes: 0 %
Lymphocytes Relative: 24 %
Lymphs Abs: 0.9 10*3/uL (ref 0.7–4.0)
MCH: 37 pg — ABNORMAL HIGH (ref 26.0–34.0)
MCHC: 36.8 g/dL — ABNORMAL HIGH (ref 30.0–36.0)
MCV: 100.6 fL — ABNORMAL HIGH (ref 80.0–100.0)
Monocytes Absolute: 0.7 10*3/uL (ref 0.1–1.0)
Monocytes Relative: 19 %
Neutro Abs: 2.1 10*3/uL (ref 1.7–7.7)
Neutrophils Relative %: 57 %
Platelets: 152 10*3/uL (ref 150–400)
RBC: 3.19 MIL/uL — ABNORMAL LOW (ref 4.22–5.81)
RDW: 13.4 % (ref 11.5–15.5)
Smear Review: NORMAL
WBC: 3.7 10*3/uL — ABNORMAL LOW (ref 4.0–10.5)
nRBC: 0 % (ref 0.0–0.2)

## 2020-06-18 LAB — COMPREHENSIVE METABOLIC PANEL
ALT: 21 U/L (ref 0–44)
AST: 27 U/L (ref 15–41)
Albumin: 2.6 g/dL — ABNORMAL LOW (ref 3.5–5.0)
Alkaline Phosphatase: 50 U/L (ref 38–126)
Anion gap: 7 (ref 5–15)
BUN: 24 mg/dL — ABNORMAL HIGH (ref 8–23)
CO2: 22 mmol/L (ref 22–32)
Calcium: 7.7 mg/dL — ABNORMAL LOW (ref 8.9–10.3)
Chloride: 104 mmol/L (ref 98–111)
Creatinine, Ser: 0.89 mg/dL (ref 0.61–1.24)
GFR calc Af Amer: >60 mL/min (ref >60)
GFR calc non Af Amer: >60 mL/min (ref >60)
Glucose, Bld: 142 mg/dL — ABNORMAL HIGH (ref 70–99)
Potassium: 4.1 mmol/L (ref 3.5–5.1)
Sodium: 133 mmol/L — ABNORMAL LOW (ref 135–145)
Total Bilirubin: 0.9 mg/dL (ref 0.3–1.2)
Total Protein: 6.9 g/dL (ref 6.5–8.1)

## 2020-06-18 LAB — C-REACTIVE PROTEIN: CRP: 1.1 mg/dL — ABNORMAL HIGH (ref <1.0)

## 2020-06-18 LAB — GLUCOSE, CAPILLARY
Glucose-Capillary: 132 mg/dL — ABNORMAL HIGH (ref 70–99)
Glucose-Capillary: 147 mg/dL — ABNORMAL HIGH (ref 70–99)

## 2020-06-18 LAB — MAGNESIUM: Magnesium: 2 mg/dL (ref 1.7–2.4)

## 2020-06-18 LAB — PHOSPHORUS: Phosphorus: 2.5 mg/dL (ref 2.5–4.6)

## 2020-06-18 LAB — FERRITIN: Ferritin: 226 ng/mL (ref 24–336)

## 2020-06-18 MED ORDER — ZINC SULFATE 220 (50 ZN) MG PO CAPS: 220.0000 mg | ORAL_CAPSULE | Freq: Every day | ORAL | 0 refills | Status: DC

## 2020-06-18 MED ORDER — ADULT MULTIVITAMIN W/MINERALS CH: 1.0000 | ORAL_TABLET | Freq: Every day | ORAL | 0 refills | Status: DC

## 2020-06-18 MED ORDER — ASCORBIC ACID 500 MG PO TABS: 500.0000 mg | ORAL_TABLET | Freq: Every day | ORAL | 0 refills | Status: DC

## 2020-06-18 MED ORDER — GUAIFENESIN-DM 100-10 MG/5ML PO SYRP: 10.0000 mL | ORAL_SOLUTION | ORAL | 0 refills | Status: DC | PRN

## 2020-06-18 NOTE — Progress Notes (Signed)
Discharge instructions reviewed with son Aaron Edelman, states understanding

## 2020-06-18 NOTE — Discharge Summary (Signed)
Physician Discharge Summary  Luis [name]. OBS:[id number] DOB: 03/11/1946 DOA: 06/14/2020  PCP: Tracie Harrier, MD  Admit date: 06/14/2020 Discharge date: 06/18/2020  Admitted From: Home Disposition: Home  Recommendations for Outpatient Follow-up:  1. Follow up with PCP in 1-2 weeks 2. Please obtain BMP/CBC in one week 3. Please follow up on the following pending results: None  Home Health: Yes Equipment/Devices: None Discharge Condition: Stable CODE STATUS: Full Diet recommendation: Heart Healthy / Carb Modified   Brief/Interim Summary: Luis Hughesis a 74 y.o.malewith medical history significant formultiple myeloma on Revlimid followed by Dr. Mike Gip, DM, HTN, CAD status post CABG followed by Dr. Nehemiah Massed, anxiety and depression who presents to the emergency room with a 5-day history of protracted weakness, cough and chest pain.  Patient still grieving the loss of his wife, who died in 10/26/2019 secondary to brain aneurysm rupture.  Patient is vaccinated with [employer] in March 2021.  Over the weekend he had a company of his unvaccinated brother who was coughing and subsequently diagnosed with Covid. Covid PCR came back positive.  Chest x-ray with no active disease.  He was started on remdesivir. No Hypoxia.  Patient completed remdesivir and Decadron for 5 days.  No need for continuation of steroid.  He was discharged home on supplement.  Patient did develop pancytopenia secondary to COVID-19 infection.  Cell counts started improving before discharge.  We obtain PT evaluation due to his physical deconditioning.  They recommended home health services which were ordered.  Patient will continue with rest of his home meds and follow-up with his providers.  Discharge Diagnoses:  Principal Problem:   Generalized weakness Active Problems:   Multiple myeloma (HCC)   Obstructive apnea   Type 2 diabetes mellitus without complication, without long-term current use of insulin  (HCC)   CAD (coronary artery disease) of artery bypass graft   Maintenance chemotherapy   COVID-19 virus infection (vaccinated Pfizer 01/2020)   COVID-19  Discharge Instructions  Discharge Instructions    Diet - low sodium heart healthy   Complete by: As directed    Discharge instructions   Complete by: As directed    It was pleasure taking care of you. Please keep yourself well-hydrated and follow-up with your primary care provider. Keep your self quarantine for another 10 days.   Increase activity slowly   Complete by: As directed      Allergies as of 06/18/2020      Reactions   Pravachol [pravastatin]    Pravastatin Sodium Other (See Comments)   Statins Other (See Comments)   Muscle aches   Zocor [simvastatin] Other (See Comments)   Muscle aches      Medication List    TAKE these medications   ALPRAZolam 0.25 MG tablet Commonly known as: XANAX Take 0.25 mg by mouth at bedtime as needed. for sleep   ascorbic acid 500 MG tablet Commonly known as: VITAMIN C Take 1 tablet (500 mg total) by mouth daily.   aspirin 81 MG tablet Take 81 mg by mouth daily.   Bee Pollen 500 MG Chew Chew 1 tablet by mouth daily.   Calcium Carbonate-Vitamin D 600-400 MG-UNIT chew tablet Chew 1 tablet by mouth 2 (two) times daily.   ergocalciferol 1.25 MG (50000 UT) capsule Commonly known as: VITAMIN D2 Take 50,000 Units by mouth once a week.   guaiFENesin-dextromethorphan 100-10 MG/5ML syrup Commonly known as: ROBITUSSIN DM Take 10 mLs by mouth every 4 (four) hours as needed for cough.  losartan 25 MG tablet Commonly known as: COZAAR Take 25 mg by mouth daily.   lovastatin 40 MG tablet Commonly known as: MEVACOR Take 40 mg by mouth at bedtime.   metFORMIN 500 MG tablet Commonly known as: GLUCOPHAGE 500 mg 2 (two) times daily with a meal.   multivitamin with minerals Tabs tablet Take 1 tablet by mouth daily.   omeprazole 20 MG capsule Commonly known as: PRILOSEC Take 20  mg by mouth daily.   ONE TOUCH ULTRA TEST test strip Generic drug: glucose blood USE ONCE DAILY. USE AS INSTRUCTED. DX E11.9   PARoxetine 10 MG tablet Commonly known as: PAXIL Take 10 mg by mouth daily.   propranolol 10 MG tablet Commonly known as: INDERAL Take 10 mg by mouth 2 (two) times daily.   traMADol 50 MG tablet Commonly known as: ULTRAM TAKE 1 TABLET BY MOUTH THREE TIMES A DAY AS NEEDED   VITAMIN B-12 IJ Inject 1 Dose as directed every 30 (thirty) days.   zinc sulfate 220 (50 Zn) MG capsule Take 1 capsule (220 mg total) by mouth daily.       Follow-up Information    Schedule an appointment as soon as possible for a visit with Tracie Harrier, MD.   Specialty: Internal Medicine Why: Patient to make own follow up appt due to office being closed for lunch Contact information: [place] [address] [phone]              Allergies  Allergen Reactions  . Pravachol [Pravastatin]   . Pravastatin Sodium Other (See Comments)  . Statins Other (See Comments)    Muscle aches  . Zocor [Simvastatin] Other (See Comments)    Muscle aches    Consultations:  None  Procedures/Studies: DG Chest 2 View  Result Date: 06/13/2020 CLINICAL DATA:  Chest pain EXAM: CHEST - 2 VIEW COMPARISON:  September 12, 2019. FINDINGS: The heart size is unchanged. The patient is status post prior CABG. Aortic calcifications are noted. Old healed left-sided rib fractures are again noted. The lungs are somewhat hyperexpanded. Scattered areas of scarring and atelectasis are again noted, greatest at the left lung base and at the left lung apex. There is an old compression fracture of the lower thoracic spine. IMPRESSION: No active cardiopulmonary disease. Electronically Signed   By: Constance Holster M.D.   On: 06/13/2020 16:32   CT Angio Chest PE W/Cm &/Or Wo Cm  Result Date: 06/14/2020 CLINICAL DATA:  PE suspected, high probability, COVID positive, chest pain,  shortness of breath, tachycardia. EXAM: CT ANGIOGRAPHY CHEST WITH CONTRAST TECHNIQUE: Multidetector CT imaging of the chest was performed using the standard protocol during bolus administration of intravenous contrast. Multiplanar CT image reconstructions and MIPs were obtained to evaluate the vascular anatomy. CONTRAST:  55m OMNIPAQUE IOHEXOL 350 MG/ML SOLN COMPARISON:  CT chest 09/26/2014. FINDINGS: CARDIOVASCULAR: Status post median sternotomy and CABG. Mild cardiomegaly. Coronary artery atherosclerotic calcifications. There is no thoracic aortic aneurysm.  Aortic atherosclerosis. Assessment for pulmonary embolus is limited by significant respiratory motion artifact. Within this limitation, no pulmonary embolus is identified. Prominence of the main pulmonary artery measuring 3.9 cm in diameter, findings suggestive of pulmonary hypertension. MEDIASTINUM/NODES: No enlarged mediastinal, hilar or axillary lymph nodes. LUNGS/PLEURA: Centrilobular and paraseptal emphysema. There is subtle ground-glass opacity within the inferior right middle lobe and dependent right lower lobe which may reflect subsegmental atelectasis or subtle changes of atypical/viral pneumonia. No confluent airspace consolidation. Redemonstrated chronic left-sided pleural thickening with associated pleural calcifications.  Associated opacities abutting the pleura within the left upper and lower lobes consistent with round atelectasis. The central airways are grossly patent UPPER ABDOMEN: No acute abnormality within the imaged upper abdomen. MUSCULOSKELETAL: Exaggerated thoracic kyphosis. There are widespread lytic lesions consistent with the known history of multiple myeloma. Redemonstrated chronic T11 vertebral compression fracture. Old healed left-sided rib fracture deformities. Review of the MIP images confirms the above findings. IMPRESSION: Evaluation for pulmonary embolus is limited by significant respiratory motion artifact. No pulmonary  embolus is identified. Subtle ground-glass opacities within the inferior right middle lobe and posterior right lower lobe which may reflect subsegmental atelectasis or subtle changes of atypical/viral pneumonia. Prominence of the main pulmonary artery suggestive of pulmonary hypertension. Centrilobular and paraseptal emphysema. Redemonstrated left-sided pleural thickening with pleural calcifications likely reflecting sequela of prior asbestos exposure. Adjacent round atelectasis within the left upper and lower lobes. Mild cardiomegaly status post CABG. Widespread lytic osseous lesions consistent with the history of multiple myeloma. Chronic T11 compression fracture. Electronically Signed   By: Kellie Simmering DO   On: 06/14/2020 07:26    Subjective: Patient has no new complaint today.  Ready to go home.  Discharge Exam: Vitals:   06/18/20 0812 06/18/20 1129  BP: 109/89 (!) 117/58  Pulse: 91 76  Resp: 17 17  Temp: 98.1 F (36.7 C) 98.4 F (36.9 C)  SpO2: 93% 95%   Vitals:   06/17/20 1944 06/18/20 0118 06/18/20 0812 06/18/20 1129  BP: (!) 141/84 (!) 149/98 109/89 (!) 117/58  Pulse: 92 83 91 76  Resp: _0 Temp: 98.7 F (37.1 C) 98.1 F (36.7 C) 98.1 F (36.7 C) 98.4 F (36.9 C)  TempSrc: Oral  Oral Oral  SpO2: 96% 94% 93% 95%  Weight:      Height:        General: Pt is alert, awake, not in acute distress Cardiovascular: RRR, S1/S2 +, no rubs, no gallops Respiratory: CTA bilaterally, no wheezing, no rhonchi Abdominal: Soft, NT, ND, bowel sounds + Extremities: no edema, no cyanosis   The results of significant diagnostics from this hospitalization (including imaging, microbiology, ancillary and laboratory) are listed below for reference.    Microbiology: Recent Results (from the past 240 hour(s))  SARS Coronavirus 2 by RT PCR (hospital order, performed in [facility] hospital lab) Nasopharyngeal Nasopharyngeal Swab     Status: Abnormal   Collection Time: 06/13/20  4:03  PM   Specimen: Nasopharyngeal Swab  Result Value Ref Range Status   SARS Coronavirus 2 POSITIVE (A) NEGATIVE Final    Comment: RESULT CALLED TO, READ BACK BY AND VERIFIED WITH: VALERIE CHANDLER 06/13/20 AT 51 BY ACR (NOTE) SARS-CoV-2 target nucleic acids are DETECTED  SARS-CoV-2 RNA is generally detectable in upper respiratory specimens  during the acute phase of infection.  Positive results are indicative  of the presence of the identified virus, but do not rule out bacterial infection or co-infection with other pathogens not detected by the test.  Clinical correlation with patient history and  other diagnostic information is necessary to determine patient infection status.  The expected result is negative.  Fact Sheet for Patients:   StrictlyIdeas.no   Fact Sheet for Healthcare Providers:   BankingDealers.co.za    This test is not yet approved or cleared by the [place] FDA and  has been authorized for detection and/or diagnosis of SARS-CoV-2 by FDA under an Emergency Use Authorization (EUA).  This EUA will remain in effect (meaning thi s test can  be used) for the duration of  the COVID-19 declaration under Section 564(b)(1) of the Act, 21 U.S.C. section 360-bbb-3(b)(1), unless the authorization is terminated or revoked sooner.  Performed at [facility], [address] [place], [place] [address]      Labs: BNP (last 3 results) Recent Labs    06/14/20 0356  BNP 64.3   Basic Metabolic Panel: Recent Labs  Lab 06/13/20 1604 06/15/20 0425 06/16/20 0602 06/17/20 0657 06/18/20 0438  NA 137 137 136 135 133*  K 3.8 4.2 4.0 4.0 4.1  CL 105 109 108 106 104  CO2 22 19* 19* 19* 22  GLUCOSE 111* 152* 105* 142* 142*  BUN 22 28* 29* 26* 24*  CREATININE 1.15 0.93 1.01 0.93 0.89  CALCIUM 8.2* 7.6* 7.6* 7.8* 7.7*  MG  --  1.4* 2.1 2.3 2.0  PHOS  --  3.1 3.2 3.0 2.5   Liver Function Tests: Recent Labs  Lab  06/15/20 0425 06/16/20 0602 06/17/20 0657 06/18/20 0438  AST 27 36 36 27  ALT _0 ALKPHOS 57 53 54 50  BILITOT 0.8 0.8 0.9 0.9  PROT 7.2 6.8 7.4 6.9  ALBUMIN 2.6* 2.7* 2.7* 2.6*   No results for input(s): LIPASE, AMYLASE in the last 168 hours. No results for input(s): AMMONIA in the last 168 hours. CBC: Recent Labs  Lab 06/13/20 1604 06/15/20 0425 06/16/20 0602 06/17/20 0657 06/18/20 0438  WBC 2.6* 1.4* 2.2* 2.6* 3.7*  NEUTROABS  --  0.7* 1.2* 1.4* 2.1  HGB 11.6* 11.1* 10.3* 11.0* 11.8*  HCT 33.3* 31.7* 29.1* 30.6* 32.1*  MCV 106.4* 106.7* 106.6* 102.7* 100.6*  PLT 97* 75* 92* 131* 152   Cardiac Enzymes: No results for input(s): CKTOTAL, CKMB, CKMBINDEX, TROPONINI in the last 168 hours. BNP: Invalid input(s): POCBNP CBG: Recent Labs  Lab 06/17/20 1218 06/17/20 1557 06/17/20 1943 06/18/20 0810 06/18/20 1128  GLUCAP 176* 178* 220* 132* 147*   D-Dimer Recent Labs    06/17/20 0657  DDIMER 0.74*   Hgb A1c No results for input(s): HGBA1C in the last 72 hours. Lipid Profile No results for input(s): CHOL, HDL, LDLCALC, TRIG, CHOLHDL, LDLDIRECT in the last 72 hours. Thyroid function studies No results for input(s): TSH, T4TOTAL, T3FREE, THYROIDAB in the last 72 hours.  Invalid input(s): FREET3 Anemia work up [employer]    06/16/20 0602 06/16/20 0602 06/17/20 0657 06/18/20 0438  VITAMINB12 927*  --   --   --   FOLATE 18.3  --   --   --   FERRITIN 255   < > 234 226  TIBC 182*  --   --   --   IRON 108  --   --   --   RETICCTPCT 0.9  --   --   --    < > = values in this interval not displayed.   Urinalysis    Component Value Date/Time   COLORURINE Yellow 08/07/2013 0102   APPEARANCEUR Clear 08/07/2013 0102   LABSPEC 1.023 08/07/2013 0102   PHURINE 5.0 08/07/2013 0102   GLUCOSEU 50 mg/dL 08/07/2013 0102   HGBUR Negative 08/07/2013 0102   BILIRUBINUR Negative 08/07/2013 0102   KETONESUR Negative 08/07/2013 0102   PROTEINUR 30 mg/dL  08/07/2013 0102   NITRITE Negative 08/07/2013 0102   LEUKOCYTESUR Negative 08/07/2013 0102   Sepsis Labs Invalid input(s): PROCALCITONIN,  WBC,  LACTICIDVEN Microbiology Recent Results (from the past 240 hour(s))  SARS Coronavirus 2 by RT PCR (hospital order, performed  in [place] lab) Nasopharyngeal Nasopharyngeal Swab     Status: Abnormal   Collection Time: 06/13/20  4:03 PM   Specimen: Nasopharyngeal Swab  Result Value Ref Range Status   SARS Coronavirus 2 POSITIVE (A) NEGATIVE Final    Comment: RESULT CALLED TO, READ BACK BY AND VERIFIED WITH: VALERIE CHANDLER 06/13/20 AT 1658 BY ACR (NOTE) SARS-CoV-2 target nucleic acids are DETECTED  SARS-CoV-2 RNA is generally detectable in upper respiratory specimens  during the acute phase of infection.  Positive results are indicative  of the presence of the identified virus, but do not rule out bacterial infection or co-infection with other pathogens not detected by the test.  Clinical correlation with patient history and  other diagnostic information is necessary to determine patient infection status.  The expected result is negative.  Fact Sheet for Patients:   StrictlyIdeas.no   Fact Sheet for Healthcare Providers:   BankingDealers.co.za    This test is not yet approved or cleared by the [place] FDA and  has been authorized for detection and/or diagnosis of SARS-CoV-2 by FDA under an Emergency Use Authorization (EUA).  This EUA will remain in effect (meaning thi s test can be used) for the duration of  the COVID-19 declaration under Section 564(b)(1) of the Act, 21 U.S.C. section 360-bbb-3(b)(1), unless the authorization is terminated or revoked sooner.  Performed at [facility], [address], [place], [place] [address]     Time coordinating discharge: Over 30 minutes  SIGNED:  Lorella Nimrod, MD  [facility] 06/18/2020, 4:22 PM  If  7PM-7AM, please contact night-coverage [email or web address]  This record has been created using Systems analyst. Errors have been sought and corrected,but may not always be located. Such creation errors do not reflect on the standard of care.

## 2020-06-18 NOTE — TOC Transition Note (Signed)
Transition of Care [facility]) - CM/SW Discharge Note   Patient Details  Name: Luis [name]. MRN: [id number] Date of Birth: [birth year]  [facility] of [facility] CM/SW Contact:  Shelbie Hutching, RN Phone Number: 06/18/2020, 12:06 PM   Clinical Narrative:    Patient is medically cleared for discharge home with home health services. Floydene Flock with [facility] accepted home health referral for RN, PT, and OT and is aware of discharge today.   Final next level of care: [place] Barriers to Discharge: Barriers Resolved   Patient Goals and CMS Choice Patient states their goals for this hospitalization and ongoing recovery are:: for his coughing to get better and to not be contagious CMS Medicare.gov Compare Post Acute Care list provided to:: Patient Choice offered to / list presented to : Patient  Discharge Placement                       Discharge Plan and Services   Discharge Planning Services: CM Consult Post Acute Care Choice: Home Health                    HH Arranged: RN, PT, OT [facility] Agency: [place] ([place]) Date [place]: 06/18/20 Time [place]: 1205 Representative spoke with at [place]: [place] (SDOH) Interventions     Readmission Risk Interventions No flowsheet data found.

## 2020-06-19 ENCOUNTER — Inpatient Hospital Stay: Payer: Medicare Other

## 2020-06-30 ENCOUNTER — Other Ambulatory Visit: Payer: Medicare Other

## 2020-06-30 ENCOUNTER — Ambulatory Visit: Payer: Medicare Other | Admitting: Hematology and Oncology

## 2020-07-09 ENCOUNTER — Other Ambulatory Visit: Payer: Self-pay | Admitting: Hematology and Oncology

## 2020-07-09 DIAGNOSIS — C9000 Multiple myeloma not having achieved remission: Secondary | ICD-10-CM

## 2020-07-15 ENCOUNTER — Other Ambulatory Visit: Payer: Self-pay

## 2020-07-15 DIAGNOSIS — E875 Hyperkalemia: Secondary | ICD-10-CM

## 2020-07-15 DIAGNOSIS — E538 Deficiency of other specified B group vitamins: Secondary | ICD-10-CM

## 2020-07-15 DIAGNOSIS — R790 Abnormal level of blood mineral: Secondary | ICD-10-CM

## 2020-07-15 DIAGNOSIS — D518 Other vitamin B12 deficiency anemias: Secondary | ICD-10-CM

## 2020-07-15 DIAGNOSIS — C9000 Multiple myeloma not having achieved remission: Secondary | ICD-10-CM

## 2020-07-15 DIAGNOSIS — R634 Abnormal weight loss: Secondary | ICD-10-CM

## 2020-07-16 ENCOUNTER — Other Ambulatory Visit: Payer: Self-pay
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
## 2020-07-17 ENCOUNTER — Inpatient Hospital Stay: Payer: Medicare Other | Attending: Hematology and Oncology

## 2020-07-17 ENCOUNTER — Inpatient Hospital Stay: Payer: Medicare Other

## 2020-07-17 ENCOUNTER — Other Ambulatory Visit: Payer: Self-pay

## 2020-07-17 ENCOUNTER — Inpatient Hospital Stay (HOSPITAL_BASED_OUTPATIENT_CLINIC_OR_DEPARTMENT_OTHER): Payer: Medicare Other | Admitting: Hematology and Oncology

## 2020-07-17 ENCOUNTER — Encounter: Payer: Self-pay | Admitting: Hematology and Oncology

## 2020-07-17 VITALS — BP 117/50 | HR 53 | Temp 97.3°F | Wt 177.7 lb

## 2020-07-17 DIAGNOSIS — C9000 Multiple myeloma not having achieved remission: Secondary | ICD-10-CM

## 2020-07-17 DIAGNOSIS — Z7982 Long term (current) use of aspirin: Secondary | ICD-10-CM | POA: Diagnosis not present

## 2020-07-17 DIAGNOSIS — F418 Other specified anxiety disorders: Secondary | ICD-10-CM | POA: Insufficient documentation

## 2020-07-17 DIAGNOSIS — G4730 Sleep apnea, unspecified: Secondary | ICD-10-CM | POA: Insufficient documentation

## 2020-07-17 DIAGNOSIS — R790 Abnormal level of blood mineral: Secondary | ICD-10-CM

## 2020-07-17 DIAGNOSIS — M4800 Spinal stenosis, site unspecified: Secondary | ICD-10-CM | POA: Diagnosis not present

## 2020-07-17 DIAGNOSIS — I10 Essential (primary) hypertension: Secondary | ICD-10-CM | POA: Diagnosis not present

## 2020-07-17 DIAGNOSIS — N2889 Other specified disorders of kidney and ureter: Secondary | ICD-10-CM | POA: Diagnosis not present

## 2020-07-17 DIAGNOSIS — R634 Abnormal weight loss: Secondary | ICD-10-CM | POA: Diagnosis not present

## 2020-07-17 DIAGNOSIS — E538 Deficiency of other specified B group vitamins: Secondary | ICD-10-CM

## 2020-07-17 DIAGNOSIS — Z79899 Other long term (current) drug therapy: Secondary | ICD-10-CM | POA: Diagnosis not present

## 2020-07-17 DIAGNOSIS — Z87891 Personal history of nicotine dependence: Secondary | ICD-10-CM | POA: Diagnosis not present

## 2020-07-17 DIAGNOSIS — R5383 Other fatigue: Secondary | ICD-10-CM | POA: Insufficient documentation

## 2020-07-17 DIAGNOSIS — E875 Hyperkalemia: Secondary | ICD-10-CM

## 2020-07-17 DIAGNOSIS — Z7984 Long term (current) use of oral hypoglycemic drugs: Secondary | ICD-10-CM | POA: Diagnosis not present

## 2020-07-17 DIAGNOSIS — D125 Benign neoplasm of sigmoid colon: Secondary | ICD-10-CM | POA: Diagnosis not present

## 2020-07-17 DIAGNOSIS — D539 Nutritional anemia, unspecified: Secondary | ICD-10-CM | POA: Diagnosis not present

## 2020-07-17 DIAGNOSIS — E119 Type 2 diabetes mellitus without complications: Secondary | ICD-10-CM | POA: Diagnosis not present

## 2020-07-17 DIAGNOSIS — K219 Gastro-esophageal reflux disease without esophagitis: Secondary | ICD-10-CM | POA: Insufficient documentation

## 2020-07-17 DIAGNOSIS — M48061 Spinal stenosis, lumbar region without neurogenic claudication: Secondary | ICD-10-CM | POA: Diagnosis not present

## 2020-07-17 DIAGNOSIS — I252 Old myocardial infarction: Secondary | ICD-10-CM | POA: Insufficient documentation

## 2020-07-17 DIAGNOSIS — Z8616 Personal history of COVID-19: Secondary | ICD-10-CM | POA: Diagnosis not present

## 2020-07-17 DIAGNOSIS — D518 Other vitamin B12 deficiency anemias: Secondary | ICD-10-CM

## 2020-07-17 DIAGNOSIS — E785 Hyperlipidemia, unspecified: Secondary | ICD-10-CM | POA: Diagnosis not present

## 2020-07-17 DIAGNOSIS — I509 Heart failure, unspecified: Secondary | ICD-10-CM | POA: Diagnosis not present

## 2020-07-17 DIAGNOSIS — I2510 Atherosclerotic heart disease of native coronary artery without angina pectoris: Secondary | ICD-10-CM | POA: Diagnosis not present

## 2020-07-17 DIAGNOSIS — N289 Disorder of kidney and ureter, unspecified: Secondary | ICD-10-CM | POA: Diagnosis not present

## 2020-07-17 LAB — COMPREHENSIVE METABOLIC PANEL
ALT: 15 U/L (ref 0–44)
AST: 29 U/L (ref 15–41)
Albumin: 3.1 g/dL — ABNORMAL LOW (ref 3.5–5.0)
Alkaline Phosphatase: 60 U/L (ref 38–126)
Anion gap: 8 (ref 5–15)
BUN: 22 mg/dL (ref 8–23)
CO2: 24 mmol/L (ref 22–32)
Calcium: 9.4 mg/dL (ref 8.9–10.3)
Chloride: 106 mmol/L (ref 98–111)
Creatinine, Ser: 1.33 mg/dL — ABNORMAL HIGH (ref 0.61–1.24)
GFR calc Af Amer: >60 mL/min (ref >60)
GFR calc non Af Amer: 52 mL/min — ABNORMAL LOW (ref >60)
Glucose, Bld: 94 mg/dL (ref 70–99)
Potassium: 3.7 mmol/L (ref 3.5–5.1)
Sodium: 138 mmol/L (ref 135–145)
Total Bilirubin: 1.2 mg/dL (ref 0.3–1.2)
Total Protein: 7.2 g/dL (ref 6.5–8.1)

## 2020-07-17 LAB — CBC WITH DIFFERENTIAL/PLATELET
Abs Immature Granulocytes: 0.02 10*3/uL (ref 0.00–0.07)
Basophils Absolute: 0.1 10*3/uL (ref 0.0–0.1)
Basophils Relative: 1 %
Eosinophils Absolute: 0.2 10*3/uL (ref 0.0–0.5)
Eosinophils Relative: 5 %
HCT: 31.2 % — ABNORMAL LOW (ref 39.0–52.0)
Hemoglobin: 10.7 g/dL — ABNORMAL LOW (ref 13.0–17.0)
Immature Granulocytes: 0 %
Lymphocytes Relative: 27 %
Lymphs Abs: 1.2 10*3/uL (ref 0.7–4.0)
MCH: 36.4 pg — ABNORMAL HIGH (ref 26.0–34.0)
MCHC: 34.3 g/dL (ref 30.0–36.0)
MCV: 106.1 fL — ABNORMAL HIGH (ref 80.0–100.0)
Monocytes Absolute: 0.8 10*3/uL (ref 0.1–1.0)
Monocytes Relative: 17 %
Neutro Abs: 2.2 10*3/uL (ref 1.7–7.7)
Neutrophils Relative %: 50 %
Platelets: 151 10*3/uL (ref 150–400)
RBC: 2.94 MIL/uL — ABNORMAL LOW (ref 4.22–5.81)
RDW: 15 % (ref 11.5–15.5)
WBC: 4.5 10*3/uL (ref 4.0–10.5)
nRBC: 0 % (ref 0.0–0.2)

## 2020-07-17 MED ORDER — CYANOCOBALAMIN 1000 MCG/ML IJ SOLN
1000.0000 ug | Freq: Once | INTRAMUSCULAR | Status: AC
  Administered 2020-07-17: 1000 ug via INTRAMUSCULAR

## 2020-07-17 MED ORDER — CYANOCOBALAMIN 1000 MCG/ML IJ SOLN
INTRAMUSCULAR | Status: AC
  Filled 2020-07-17: qty 1

## 2020-07-17 NOTE — Progress Notes (Signed)
Patient here for oncology follow-up appointment, expresses complaints of weakness and lightheaded.

## 2020-07-17 NOTE — Patient Instructions (Signed)
  No Revlimid this month.  Reassess next month.

## 2020-07-18 LAB — PROTEIN ELECTROPHORESIS, SERUM
A/G Ratio: 0.8 (ref 0.7–1.7)
Albumin ELP: 3 g/dL (ref 2.9–4.4)
Alpha-1-Globulin: 0.2 g/dL (ref 0.0–0.4)
Alpha-2-Globulin: 0.7 g/dL (ref 0.4–1.0)
Beta Globulin: 1.2 g/dL (ref 0.7–1.3)
Gamma Globulin: 1.7 g/dL (ref 0.4–1.8)
Globulin, Total: 3.8 g/dL (ref 2.2–3.9)
Total Protein ELP: 6.8 g/dL (ref 6.0–8.5)
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
## 2020-08-13 ENCOUNTER — Inpatient Hospital Stay: Payer: Medicare Other | Attending: Hematology and Oncology | Admitting: Hematology and Oncology

## 2020-08-13 ENCOUNTER — Inpatient Hospital Stay: Payer: Medicare Other

## 2020-08-13 ENCOUNTER — Other Ambulatory Visit: Payer: Self-pay

## 2020-08-13 ENCOUNTER — Encounter: Payer: Self-pay | Admitting: Hematology and Oncology

## 2020-08-13 VITALS — BP 113/50 | HR 55 | Temp 96.8°F | Resp 18 | Wt 180.8 lb

## 2020-08-13 DIAGNOSIS — M129 Arthropathy, unspecified: Secondary | ICD-10-CM | POA: Diagnosis not present

## 2020-08-13 DIAGNOSIS — Z791 Long term (current) use of non-steroidal anti-inflammatories (NSAID): Secondary | ICD-10-CM | POA: Diagnosis not present

## 2020-08-13 DIAGNOSIS — F419 Anxiety disorder, unspecified: Secondary | ICD-10-CM | POA: Insufficient documentation

## 2020-08-13 DIAGNOSIS — R5383 Other fatigue: Secondary | ICD-10-CM

## 2020-08-13 DIAGNOSIS — I10 Essential (primary) hypertension: Secondary | ICD-10-CM | POA: Diagnosis not present

## 2020-08-13 DIAGNOSIS — N289 Disorder of kidney and ureter, unspecified: Secondary | ICD-10-CM | POA: Insufficient documentation

## 2020-08-13 DIAGNOSIS — R634 Abnormal weight loss: Secondary | ICD-10-CM

## 2020-08-13 DIAGNOSIS — G4730 Sleep apnea, unspecified: Secondary | ICD-10-CM | POA: Diagnosis not present

## 2020-08-13 DIAGNOSIS — C9000 Multiple myeloma not having achieved remission: Secondary | ICD-10-CM | POA: Diagnosis present

## 2020-08-13 DIAGNOSIS — E538 Deficiency of other specified B group vitamins: Secondary | ICD-10-CM

## 2020-08-13 DIAGNOSIS — K219 Gastro-esophageal reflux disease without esophagitis: Secondary | ICD-10-CM | POA: Diagnosis not present

## 2020-08-13 DIAGNOSIS — E119 Type 2 diabetes mellitus without complications: Secondary | ICD-10-CM | POA: Insufficient documentation

## 2020-08-13 DIAGNOSIS — Z7984 Long term (current) use of oral hypoglycemic drugs: Secondary | ICD-10-CM | POA: Diagnosis not present

## 2020-08-13 DIAGNOSIS — Z8042 Family history of malignant neoplasm of prostate: Secondary | ICD-10-CM | POA: Insufficient documentation

## 2020-08-13 DIAGNOSIS — Z8616 Personal history of COVID-19: Secondary | ICD-10-CM | POA: Insufficient documentation

## 2020-08-13 DIAGNOSIS — L989 Disorder of the skin and subcutaneous tissue, unspecified: Secondary | ICD-10-CM | POA: Diagnosis not present

## 2020-08-13 DIAGNOSIS — Z79899 Other long term (current) drug therapy: Secondary | ICD-10-CM | POA: Insufficient documentation

## 2020-08-13 DIAGNOSIS — D539 Nutritional anemia, unspecified: Secondary | ICD-10-CM | POA: Insufficient documentation

## 2020-08-13 DIAGNOSIS — Z7189 Other specified counseling: Secondary | ICD-10-CM

## 2020-08-13 DIAGNOSIS — Z87891 Personal history of nicotine dependence: Secondary | ICD-10-CM | POA: Insufficient documentation

## 2020-08-13 DIAGNOSIS — E785 Hyperlipidemia, unspecified: Secondary | ICD-10-CM | POA: Insufficient documentation

## 2020-08-13 DIAGNOSIS — M4800 Spinal stenosis, site unspecified: Secondary | ICD-10-CM | POA: Diagnosis not present

## 2020-08-13 DIAGNOSIS — I2510 Atherosclerotic heart disease of native coronary artery without angina pectoris: Secondary | ICD-10-CM | POA: Diagnosis not present

## 2020-08-13 DIAGNOSIS — I252 Old myocardial infarction: Secondary | ICD-10-CM | POA: Diagnosis not present

## 2020-08-13 DIAGNOSIS — F329 Major depressive disorder, single episode, unspecified: Secondary | ICD-10-CM | POA: Insufficient documentation

## 2020-08-13 DIAGNOSIS — Z7982 Long term (current) use of aspirin: Secondary | ICD-10-CM | POA: Insufficient documentation

## 2020-08-13 DIAGNOSIS — I509 Heart failure, unspecified: Secondary | ICD-10-CM | POA: Insufficient documentation

## 2020-08-13 LAB — CBC WITH DIFFERENTIAL/PLATELET
Abs Immature Granulocytes: 0.02 10*3/uL (ref 0.00–0.07)
Basophils Absolute: 0 10*3/uL (ref 0.0–0.1)
Basophils Relative: 1 %
Eosinophils Absolute: 0.2 10*3/uL (ref 0.0–0.5)
Eosinophils Relative: 7 %
HCT: 30.6 % — ABNORMAL LOW (ref 39.0–52.0)
Hemoglobin: 10.6 g/dL — ABNORMAL LOW (ref 13.0–17.0)
Immature Granulocytes: 1 %
Lymphocytes Relative: 40 %
Lymphs Abs: 1.4 10*3/uL (ref 0.7–4.0)
MCH: 38 pg — ABNORMAL HIGH (ref 26.0–34.0)
MCHC: 34.6 g/dL (ref 30.0–36.0)
MCV: 109.7 fL — ABNORMAL HIGH (ref 80.0–100.0)
Monocytes Absolute: 0.3 10*3/uL (ref 0.1–1.0)
Monocytes Relative: 10 %
Neutro Abs: 1.4 10*3/uL — ABNORMAL LOW (ref 1.7–7.7)
Neutrophils Relative %: 41 %
Platelets: 122 10*3/uL — ABNORMAL LOW (ref 150–400)
RBC: 2.79 MIL/uL — ABNORMAL LOW (ref 4.22–5.81)
RDW: 16.7 % — ABNORMAL HIGH (ref 11.5–15.5)
WBC: 3.3 10*3/uL — ABNORMAL LOW (ref 4.0–10.5)
nRBC: 0 % (ref 0.0–0.2)

## 2020-08-13 LAB — COMPREHENSIVE METABOLIC PANEL
ALT: 13 U/L (ref 0–44)
AST: 26 U/L (ref 15–41)
Albumin: 3 g/dL — ABNORMAL LOW (ref 3.5–5.0)
Alkaline Phosphatase: 77 U/L (ref 38–126)
Anion gap: 9 (ref 5–15)
BUN: 21 mg/dL (ref 8–23)
CO2: 22 mmol/L (ref 22–32)
Calcium: 9 mg/dL (ref 8.9–10.3)
Chloride: 105 mmol/L (ref 98–111)
Creatinine, Ser: 1.06 mg/dL (ref 0.61–1.24)
GFR calc non Af Amer: >60 mL/min (ref >60)
Glucose, Bld: 121 mg/dL — ABNORMAL HIGH (ref 70–99)
Potassium: 3.8 mmol/L (ref 3.5–5.1)
Sodium: 136 mmol/L (ref 135–145)
Total Bilirubin: 0.7 mg/dL (ref 0.3–1.2)
Total Protein: 7.2 g/dL (ref 6.5–8.1)

## 2020-08-13 MED ORDER — CYANOCOBALAMIN 1000 MCG/ML IJ SOLN
INTRAMUSCULAR | Status: AC
  Filled 2020-08-13: qty 1

## 2020-08-13 MED ORDER — CYANOCOBALAMIN 1000 MCG/ML IJ SOLN
1000.0000 ug | Freq: Once | INTRAMUSCULAR | Status: AC
  Administered 2020-08-13: 1000 ug via INTRAMUSCULAR

## 2020-08-13 NOTE — Patient Instructions (Addendum)
  Continue to hold the Revlimid.

## 2020-08-14 LAB — RETICULOCYTES
Immature Retic Fract: 20.8 % — ABNORMAL HIGH (ref 2.3–15.9)
RBC.: 2.71 MIL/uL — ABNORMAL LOW (ref 4.22–5.81)
Retic Count, Absolute: 67.8 10*3/uL (ref 19.0–186.0)
Retic Ct Pct: 2.5 % (ref 0.4–3.1)

## 2020-08-14 LAB — T4, FREE: Free T4: 0.61 ng/dL (ref 0.61–1.12)

## 2020-08-14 LAB — TSH: TSH: 2.633 u[IU]/mL (ref 0.350–4.500)

## 2020-08-24 DIAGNOSIS — L989 Disorder of the skin and subcutaneous tissue, unspecified: Secondary | ICD-10-CM | POA: Insufficient documentation

## 2020-08-25 ENCOUNTER — Telehealth: Payer: Self-pay

## 2020-08-25 NOTE — Telephone Encounter (Signed)
Pts oncologist wants pt to have an appt with [place] for scalp lesion.  Please contact him to see if he wants to make appt.  Please DOCUMENT THE ATTEMPT to make appt.  thanks    Patient has been scheduled for 09/08/20.

## 2020-09-08 ENCOUNTER — Ambulatory Visit (INDEPENDENT_AMBULATORY_CARE_PROVIDER_SITE_OTHER): Payer: Medicare Other | Admitting: Dermatology

## 2020-09-08 ENCOUNTER — Other Ambulatory Visit: Payer: Self-pay

## 2020-09-08 DIAGNOSIS — L578 Other skin changes due to chronic exposure to nonionizing radiation: Secondary | ICD-10-CM

## 2020-09-08 DIAGNOSIS — L570 Actinic keratosis: Secondary | ICD-10-CM | POA: Diagnosis not present

## 2020-09-08 DIAGNOSIS — L820 Inflamed seborrheic keratosis: Secondary | ICD-10-CM | POA: Diagnosis not present

## 2020-09-08 DIAGNOSIS — D692 Other nonthrombocytopenic purpura: Secondary | ICD-10-CM

## 2020-09-08 DIAGNOSIS — D485 Neoplasm of uncertain behavior of skin: Secondary | ICD-10-CM | POA: Diagnosis not present

## 2020-09-08 NOTE — Progress Notes (Signed)
Follow-Up Visit   Subjective  Luis [name]. is a 74 y.o. male who presents for the following: Ulceration (on the L scalp - patient does pick at it nervously, has been there for 3 or 4 months now). The patient's wife died recently and he has been depressed.  He also had Covid recently.  He has been picking a lot of spots on his scalp and body  The following portions of the chart were reviewed this encounter and updated as appropriate:  Tobacco  Allergies  Meds  Problems  Med Hx  Surg Hx  Fam Hx     Review of Systems:  No other skin or systemic complaints except as noted in HPI or Assessment and Plan.  Objective  Well appearing patient in no apparent distress; mood and affect are within normal limits.  A focused examination was performed including the scalp . Relevant physical exam findings are noted in the Assessment and Plan.  Objective  L ant scalp: 4.0 x 3.5 cm ulcerated plaque     Objective  Scalp and face (8): Erythematous thin papules/macules with gritty scale.   Objective  R forehead x 1: Erythematous keratotic or waxy stuck-on papule or plaque.   Assessment & Plan  Neoplasm of uncertain behavior of skin L ant scalp  Skin / nail biopsy Type of biopsy: tangential   Informed consent: discussed and consent obtained   Timeout: patient name, date of birth, surgical site, and procedure verified   Procedure prep:  Patient was prepped and draped in usual sterile fashion Prep type:  Isopropyl alcohol Anesthesia: the lesion was anesthetized in a standard fashion   Anesthetic:  1% lidocaine w/ epinephrine 1-100,000 buffered w/ 8.4% NaHCO3 Instrument used: flexible razor blade   Hemostasis achieved with: pressure, aluminum chloride and electrodesiccation   Outcome: patient tolerated procedure well   Post-procedure details: sterile dressing applied and wound care instructions given   Dressing type: bandage and petrolatum    Specimen 1 - Surgical  pathology Differential Diagnosis: D48.5 r/o SCC  Check Margins: No 4.0 x 3.5 cm ulcerated plaque  AK (actinic keratosis) (8) Scalp and face  Destruction of lesion - Scalp and face Complexity: simple   Destruction method: cryotherapy   Informed consent: discussed and consent obtained   Timeout:  patient name, date of birth, surgical site, and procedure verified Lesion destroyed using liquid nitrogen: Yes   Region frozen until ice ball extended beyond lesion: Yes   Outcome: patient tolerated procedure well with no complications   Post-procedure details: wound care instructions given    Inflamed seborrheic keratosis R forehead x 1  Destruction of lesion - R forehead x 1 Complexity: simple   Destruction method: cryotherapy   Informed consent: discussed and consent obtained   Timeout:  patient name, date of birth, surgical site, and procedure verified Lesion destroyed using liquid nitrogen: Yes   Region frozen until ice ball extended beyond lesion: Yes   Outcome: patient tolerated procedure well with no complications   Post-procedure details: wound care instructions given    Actinic Damage - diffuse scaly erythematous macules with underlying dyspigmentation - Recommend daily broad spectrum sunscreen SPF 30+ to sun-exposed areas, reapply every 2 hours as needed.  - Call for new or changing lesions.  Purpura - Violaceous macules and patches - Benign - Related to age, sun damage and/or use of blood thinners - Observe - Can use OTC arnica containing moisturizer such as Dermend Bruise Formula if desired - Call for  worsening or other concerns   Return in about 4 months (around 01/06/2021) for AK follow up .  Luther Redo, CMA, am acting as scribe for Sarina Ser, MD .  Documentation: I have reviewed the above documentation for accuracy and completeness, and I agree with the above.  Sarina Ser, MD

## 2020-09-08 NOTE — Patient Instructions (Signed)

## 2020-09-09 ENCOUNTER — Encounter: Payer: Self-pay | Admitting: Dermatology

## 2020-09-15 ENCOUNTER — Telehealth: Payer: Self-pay

## 2020-09-15 ENCOUNTER — Inpatient Hospital Stay: Payer: Medicare Other

## 2020-09-15 NOTE — Telephone Encounter (Signed)
Patient informed of pathology results and appointment scheduled.  °

## 2020-09-15 NOTE — Telephone Encounter (Signed)
-----   Message from Ralene Bathe, MD sent at 09/11/2020  5:55 PM EDT ----- Diagnosis Skin , left ant scalp WELL DIFFERENTIATED SQUAMOUS CELL CARCINOMA  Cancer - SCC Schedule to discuss treatment options (within 2 wks) Consider MOHS vs radiation

## 2020-09-17 ENCOUNTER — Encounter: Payer: Self-pay | Admitting: Hematology and Oncology

## 2020-09-17 NOTE — Progress Notes (Signed)
Patient called for pre assessment. States he is having some pain due to spinal stenosis today. Rates pain at 4. Also states about 4 months ago he noticed a spot on top of his left scalp. Recently went to [facility] and biopsy was preformed. Biopsy showed squamous cell carcinoma. States he is being followed by Dr. Nehemiah Massed and has follow up appointment on 11/16. Denies other concerns at this time.
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
## 2020-09-18 ENCOUNTER — Inpatient Hospital Stay (HOSPITAL_BASED_OUTPATIENT_CLINIC_OR_DEPARTMENT_OTHER): Payer: Medicare Other | Admitting: Hematology and Oncology

## 2020-09-18 ENCOUNTER — Inpatient Hospital Stay: Payer: Medicare Other | Attending: Hematology and Oncology

## 2020-09-18 ENCOUNTER — Other Ambulatory Visit: Payer: Self-pay

## 2020-09-18 ENCOUNTER — Other Ambulatory Visit: Payer: Medicare Other

## 2020-09-18 ENCOUNTER — Ambulatory Visit: Payer: Medicare Other | Admitting: Hematology and Oncology

## 2020-09-18 ENCOUNTER — Encounter: Payer: Self-pay | Admitting: Hematology and Oncology

## 2020-09-18 ENCOUNTER — Inpatient Hospital Stay: Payer: Medicare Other

## 2020-09-18 VITALS — BP 130/61 | HR 54 | Temp 97.8°F | Resp 18 | Wt 177.5 lb

## 2020-09-18 DIAGNOSIS — C9000 Multiple myeloma not having achieved remission: Secondary | ICD-10-CM

## 2020-09-18 DIAGNOSIS — E538 Deficiency of other specified B group vitamins: Secondary | ICD-10-CM | POA: Diagnosis not present

## 2020-09-18 DIAGNOSIS — D539 Nutritional anemia, unspecified: Secondary | ICD-10-CM | POA: Diagnosis not present

## 2020-09-18 DIAGNOSIS — C4492 Squamous cell carcinoma of skin, unspecified: Secondary | ICD-10-CM | POA: Diagnosis not present

## 2020-09-18 DIAGNOSIS — Z79899 Other long term (current) drug therapy: Secondary | ICD-10-CM | POA: Diagnosis not present

## 2020-09-18 LAB — CBC WITH DIFFERENTIAL/PLATELET
Abs Immature Granulocytes: 0.01 10*3/uL (ref 0.00–0.07)
Basophils Absolute: 0 10*3/uL (ref 0.0–0.1)
Basophils Relative: 1 %
Eosinophils Absolute: 0.2 10*3/uL (ref 0.0–0.5)
Eosinophils Relative: 6 %
HCT: 32.5 % — ABNORMAL LOW (ref 39.0–52.0)
Hemoglobin: 10.9 g/dL — ABNORMAL LOW (ref 13.0–17.0)
Immature Granulocytes: 0 %
Lymphocytes Relative: 35 %
Lymphs Abs: 1.2 10*3/uL (ref 0.7–4.0)
MCH: 37.3 pg — ABNORMAL HIGH (ref 26.0–34.0)
MCHC: 33.5 g/dL (ref 30.0–36.0)
MCV: 111.3 fL — ABNORMAL HIGH (ref 80.0–100.0)
Monocytes Absolute: 0.3 10*3/uL (ref 0.1–1.0)
Monocytes Relative: 9 %
Neutro Abs: 1.7 10*3/uL (ref 1.7–7.7)
Neutrophils Relative %: 49 %
Platelets: 150 10*3/uL (ref 150–400)
RBC: 2.92 MIL/uL — ABNORMAL LOW (ref 4.22–5.81)
RDW: 15.9 % — ABNORMAL HIGH (ref 11.5–15.5)
WBC: 3.6 10*3/uL — ABNORMAL LOW (ref 4.0–10.5)
nRBC: 0 % (ref 0.0–0.2)

## 2020-09-18 LAB — COMPREHENSIVE METABOLIC PANEL
ALT: 15 U/L (ref 0–44)
AST: 33 U/L (ref 15–41)
Albumin: 3.1 g/dL — ABNORMAL LOW (ref 3.5–5.0)
Alkaline Phosphatase: 98 U/L (ref 38–126)
Anion gap: 10 (ref 5–15)
BUN: 18 mg/dL (ref 8–23)
CO2: 22 mmol/L (ref 22–32)
Calcium: 8.7 mg/dL — ABNORMAL LOW (ref 8.9–10.3)
Chloride: 108 mmol/L (ref 98–111)
Creatinine, Ser: 1 mg/dL (ref 0.61–1.24)
GFR, Estimated: >60 mL/min (ref >60)
Glucose, Bld: 100 mg/dL — ABNORMAL HIGH (ref 70–99)
Potassium: 4.2 mmol/L (ref 3.5–5.1)
Sodium: 140 mmol/L (ref 135–145)
Total Bilirubin: 0.7 mg/dL (ref 0.3–1.2)
Total Protein: 7.7 g/dL (ref 6.5–8.1)

## 2020-09-18 MED ORDER — CYANOCOBALAMIN 1000 MCG/ML IJ SOLN
1000.0000 ug | Freq: Once | INTRAMUSCULAR | Status: AC
  Administered 2020-09-18: 1000 ug via INTRAMUSCULAR

## 2020-09-19 LAB — PROTEIN ELECTROPHORESIS, SERUM
A/G Ratio: 0.7 (ref 0.7–1.7)
Albumin ELP: 3 g/dL (ref 2.9–4.4)
Alpha-1-Globulin: 0.2 g/dL (ref 0.0–0.4)
Alpha-2-Globulin: 0.6 g/dL (ref 0.4–1.0)
Beta Globulin: 1.2 g/dL (ref 0.7–1.3)
Gamma Globulin: 2.1 g/dL — ABNORMAL HIGH (ref 0.4–1.8)
Globulin, Total: 4.2 g/dL — ABNORMAL HIGH (ref 2.2–3.9)
Total Protein ELP: 7.2 g/dL (ref 6.0–8.5)

## 2020-09-23 ENCOUNTER — Ambulatory Visit (INDEPENDENT_AMBULATORY_CARE_PROVIDER_SITE_OTHER): Payer: Medicare Other | Admitting: Dermatology

## 2020-09-23 ENCOUNTER — Other Ambulatory Visit: Payer: Self-pay

## 2020-09-23 DIAGNOSIS — Z8579 Personal history of other malignant neoplasms of lymphoid, hematopoietic and related tissues: Secondary | ICD-10-CM | POA: Diagnosis not present

## 2020-09-23 DIAGNOSIS — C4442 Squamous cell carcinoma of skin of scalp and neck: Secondary | ICD-10-CM

## 2020-09-23 DIAGNOSIS — C4492 Squamous cell carcinoma of skin, unspecified: Secondary | ICD-10-CM

## 2020-09-23 NOTE — Progress Notes (Signed)
   Follow-Up Visit   Subjective  Luis [name]. is a 73 y.o. male who presents for the following: SCC bx proven (L ant scalp, discuss treatment options).  The following portions of the chart were reviewed this encounter and updated as appropriate:  Tobacco  Allergies  Meds  Problems  Med Hx  Surg Hx  Fam Hx     Review of Systems:  No other skin or systemic complaints except as noted in HPI or Assessment and Plan.  Objective  Well appearing patient in no apparent distress; mood and affect are within normal limits.  A focused examination was performed including scalp. Relevant physical exam findings are noted in the Assessment and Plan.  Objective  L anterior scalp: 5.2 x 3.2cm cm Ulcerated plaque        Assessment & Plan    History of Multiple Myeloma - Pt followed by Dr. Nolon Stalls at [facility] cancer center.  Squamous cell carcinoma of skin L anterior scalp Biopsy proven Large tumor of scalp Discussed treatment options MOHs vs Radiation.  Will refer pt to Dr. Baruch Gouty at the [facility].   If Dr. Baruch Gouty doesn't feel radiation is best treatment option will refer pt for [facility].  Detailed discussion with patient.  He is agreeable to pursue radiation therapy.  He is scheduled to see Dr Baruch Gouty at [facility] 09/29/20.  Other Related Procedures Ambulatory referral to Radiation Oncology  Return in about 3 months (around 12/24/2020) for f/u SCC L ant scalp.  Documentation: I have reviewed the above documentation for accuracy and completeness, and I agree with the above.  Sarina Ser, MD

## 2020-09-26 ENCOUNTER — Encounter: Payer: Self-pay | Admitting: Dermatology

## 2020-09-29 ENCOUNTER — Encounter: Payer: Self-pay | Admitting: Radiation Oncology

## 2020-09-29 ENCOUNTER — Other Ambulatory Visit: Payer: Self-pay

## 2020-09-29 ENCOUNTER — Ambulatory Visit
Admission: RE | Admit: 2020-09-29 | Discharge: 2020-09-29 | Disposition: A | Discharge status: Home / Self Care | Payer: Medicare Other | Source: Ambulatory Visit | Attending: Radiation Oncology | Admitting: Radiation Oncology

## 2020-09-29 VITALS — BP 118/77 | HR 81 | Temp 96.7°F | Wt 185.0 lb

## 2020-09-29 DIAGNOSIS — C4492 Squamous cell carcinoma of skin, unspecified: Secondary | ICD-10-CM

## 2020-09-29 NOTE — Consult Note (Signed)
NEW PATIENT EVALUATION  Name: Luis [name].  MRN: [id number]  Date:   09/29/2020     DOB: [birth year]   This 74 y.o. 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  And patient with known multiple myeloma  REFERRING PHYSICIAN: Tracie Harrier, MD  CHIEF COMPLAINT:  Chief Complaint  Patient presents with  . Consult    DIAGNOSIS: The encounter diagnosis was Squamous cell skin cancer.   PREVIOUS INVESTIGATIONS:  Pathology report reviewed Clinical notes reviewed  HPI: Patient is a 74 year old male followed by Dr. Mike Gip for multiple myeloma being treated with Revlimid.  He is also been followed by Dr. Nehemiah Massed who refers him for locally bad squamous cell carcinoma of the scalp biopsy was positive for well-differentiated squamous cell carcinoma.  It tends to itch and he does pick at it often.  He is seen today for consideration of radiation therapy.  PLANNED TREATMENT REGIMEN: Electron-beam therapy  PAST MEDICAL HISTORY:  has a past medical history of Angina pectoris ([place]), Anxiety, Arthritis, CHF (congestive heart failure) ([place]), Coronary artery disease, Depression, Diabetes mellitus without complication ([place]), Diverticulosis, GERD (gastroesophageal reflux disease), Hyperlipidemia, Hypertension, Multiple myeloma ([place]), Multiple myeloma ([place]) (03/27/2015), Myocardial infarction [facility]) (April 2001), Shortness of breath dyspnea, Sleep apnea, Spinal stenosis, Squamous cell carcinoma of skin (02/19/2014), and Squamous cell carcinoma of skin (09/08/2020).    PAST SURGICAL HISTORY:  Past Surgical History:  Procedure Laterality Date  . CARDIAC CATHETERIZATION    . CARPAL TUNNEL RELEASE    . CATARACT EXTRACTION    . COLONOSCOPY WITH PROPOFOL N/A 11/11/2015   Procedure: COLONOSCOPY WITH PROPOFOL;  Surgeon: Lollie Sails, MD;  Location: [facility] ENDOSCOPY;  Service: Endoscopy;  Laterality: N/A;  . CORONARY ARTERY BYPASS  GRAFT    . EYE SURGERY Bilateral    Cataract Extraction  . INGUINAL HERNIA REPAIR    . JOINT REPLACEMENT Right 2008   Right Total Hip Replacement  . PILONIDAL CYST EXCISION    . ROTATOR CUFF REPAIR    . TOTAL HIP ARTHROPLASTY Right   . VENTRAL HERNIA REPAIR N/A 08/15/2015   Procedure: VENTRAL HERNIA REPAIR WITH MESH ;  Surgeon: Leonie Green, MD;  Location: [facility] ORS;  Service: General;  Laterality: N/A;    FAMILY HISTORY: family history includes Heart disease in his father; Prostate cancer (age of onset: 40) in his maternal grandfather; Stroke in his mother.  SOCIAL HISTORY:  reports that he quit smoking about 20 years ago. His smoking use included cigarettes. He has a 60.00 pack-year smoking history. He quit smokeless tobacco use about 20 years ago. He reports that he does not drink alcohol and does not use drugs.  ALLERGIES: Pravachol [pravastatin], Pravastatin sodium, Statins, and Zocor [simvastatin]  MEDICATIONS:  Current Outpatient Medications  Medication Sig Dispense Refill  . ALPRAZolam (XANAX) 0.25 MG tablet Take 0.25 mg by mouth at bedtime as needed. for sleep  3  . ascorbic acid (VITAMIN C) 500 MG tablet Take 1 tablet (500 mg total) by mouth daily. 90 tablet 0  . aspirin 81 MG tablet Take 81 mg by mouth daily.     [name] Pollen 500 MG CHEW Chew 1 tablet by mouth daily.     . Calcium Carbonate-Vitamin D 600-400 MG-UNIT chew tablet Chew 1 tablet by mouth 2 (two) times daily.     . Cyanocobalamin (VITAMIN B-12 IJ) Inject 1 Dose as directed every 30 (thirty) days.     Marland Kitchen  ergocalciferol (VITAMIN D2) 50000 UNITS capsule Take 50,000 Units by mouth once a week.    Marland Kitchen glucose blood (ONE TOUCH ULTRA TEST) test strip USE ONCE DAILY. USE AS INSTRUCTED. DX E11.9    . lovastatin (MEVACOR) 40 MG tablet Take 40 mg by mouth at bedtime.     . metFORMIN (GLUCOPHAGE) 500 MG tablet 500 mg 2 (two) times daily with a meal.     . Multiple Vitamin (MULTIVITAMIN WITH MINERALS) TABS tablet Take 1  tablet by mouth daily. 90 tablet 0  . omeprazole (PRILOSEC) 20 MG capsule Take 20 mg by mouth daily.    Marland Kitchen PARoxetine (PAXIL) 10 MG tablet Take 10 mg by mouth daily.     . propranolol (INDERAL) 10 MG tablet Take 10 mg by mouth 2 (two) times daily.    . traMADol (ULTRAM) 50 MG tablet TAKE 1 TABLET BY MOUTH THREE TIMES A DAY AS NEEDED 60 tablet 0  . zinc sulfate 220 (50 Zn) MG capsule Take 1 capsule (220 mg total) by mouth daily. (Patient taking differently: Take 220 mg by mouth daily as needed. ) 90 capsule 0  . guaiFENesin-dextromethorphan (ROBITUSSIN DM) 100-10 MG/5ML syrup Take 10 mLs by mouth every 4 (four) hours as needed for cough. (Patient not taking: Reported on 09/18/2020) 118 mL 0  . ibuprofen (ADVIL) 200 MG tablet Take 200 mg by mouth every 6 (six) hours as needed. (Patient not taking: Reported on 09/18/2020)    . losartan (COZAAR) 25 MG tablet Take 25 mg by mouth daily.      No current facility-administered medications for this encounter.    ECOG PERFORMANCE STATUS:  0 - Asymptomatic  REVIEW OF SYSTEMS: Patient does have multiple myeloma and B12 deficiency Patient denies any weight loss, fatigue, weakness, fever, chills or night sweats. Patient denies any loss of vision, blurred vision. Patient denies any ringing  of the ears or hearing loss. No irregular heartbeat. Patient denies heart murmur or history of fainting. Patient denies any chest pain or pain radiating to her upper extremities. Patient denies any shortness of breath, difficulty breathing at night, cough or hemoptysis. Patient denies any swelling in the lower legs. Patient denies any nausea vomiting, vomiting of blood, or coffee ground material in the vomitus. Patient denies any stomach pain. Patient states has had normal bowel movements no significant constipation or diarrhea. Patient denies any dysuria, hematuria or significant nocturia. Patient denies any problems walking, swelling in the joints or loss of balance. Patient  denies any skin changes, loss of hair or loss of weight. Patient denies any excessive worrying or anxiety or significant depression. Patient denies any problems with insomnia. Patient denies excessive thirst, polyuria, polydipsia. Patient denies any swollen glands, patient denies easy bruising or easy bleeding. Patient denies any recent infections, allergies or URI. Patient "s visual fields have not changed significantly in recent time.   PHYSICAL EXAM: BP 118/77   Pulse 81   Temp (!) 96.7 F (35.9 C) (Tympanic)   Wt 185 lb (83.9 kg)   BMI 28.98 kg/m  Patient has approximately 4 cm lesion of the left anterior scalp.  No evidence of lymphadenopathy in the head and neck region.  Well-developed well-nourished patient in NAD. HEENT reveals PERLA, EOMI, discs not visualized.  Oral cavity is clear. No oral mucosal lesions are identified. Neck is clear without evidence of cervical or supraclavicular adenopathy. Lungs are clear to A&P. Cardiac examination is essentially unremarkable with regular rate and rhythm without murmur rub or thrill. Abdomen  is benign with no organomegaly or masses noted. Motor sensory and DTR levels are equal and symmetric in the upper and lower extremities. Cranial nerves II through XII are grossly intact. Proprioception is intact. No peripheral adenopathy or edema is identified. No motor or sensory levels are noted. Crude visual fields are within normal range.  LABORATORY DATA: Pathology report reviewed    RADIOLOGY RESULTS: CT scan of the chest reviewed showing changes of bone consistent with multiple myeloma   IMPRESSION: Local events well-differentiated squamous cell carcinoma the scalp in 74 year old male with history of multiple myeloma  PLAN: At this time I have recommended a course of electron-beam therapy to his scalp.  Would plan on delivering 50 Gray over 5 weeks.  Risks and benefits of treatment [place] skin reaction hyperpigmentation of the skin possible mild  fatigue all were reviewed with the patient and his 2 sons.  I have personally set up and ordered CT simulation for early next week.  Patient comprehends my recommendations well.  I would like to take this opportunity to thank you for allowing me to participate in the care of your patient.Noreene Filbert, MD

## 2020-10-06 ENCOUNTER — Ambulatory Visit: Payer: Medicare Other | Attending: Radiation Oncology

## 2020-10-06 DIAGNOSIS — C4442 Squamous cell carcinoma of skin of scalp and neck: Secondary | ICD-10-CM | POA: Diagnosis not present

## 2020-10-06 DIAGNOSIS — Z510 Encounter for antineoplastic radiation therapy: Secondary | ICD-10-CM | POA: Diagnosis not present

## 2020-10-07 DIAGNOSIS — Z510 Encounter for antineoplastic radiation therapy: Secondary | ICD-10-CM | POA: Diagnosis not present

## 2020-10-13 ENCOUNTER — Inpatient Hospital Stay: Payer: Medicare Other

## 2020-10-13 ENCOUNTER — Ambulatory Visit
Admission: RE | Admit: 2020-10-13 | Discharge: 2020-10-13 | Disposition: A | Discharge status: Home / Self Care | Payer: Medicare Other | Source: Ambulatory Visit | Attending: Radiation Oncology | Admitting: Radiation Oncology

## 2020-10-13 DIAGNOSIS — C4442 Squamous cell carcinoma of skin of scalp and neck: Secondary | ICD-10-CM | POA: Diagnosis not present

## 2020-10-13 DIAGNOSIS — Z510 Encounter for antineoplastic radiation therapy: Secondary | ICD-10-CM | POA: Insufficient documentation

## 2020-10-14 ENCOUNTER — Ambulatory Visit
Admission: RE | Admit: 2020-10-14 | Discharge: 2020-10-14 | Disposition: A | Discharge status: Home / Self Care | Payer: Medicare Other | Source: Ambulatory Visit | Attending: Radiation Oncology | Admitting: Radiation Oncology

## 2020-10-14 DIAGNOSIS — Z510 Encounter for antineoplastic radiation therapy: Secondary | ICD-10-CM | POA: Diagnosis not present

## 2020-10-15 ENCOUNTER — Ambulatory Visit
Admission: RE | Admit: 2020-10-15 | Discharge: 2020-10-15 | Disposition: A | Discharge status: Home / Self Care | Payer: Medicare Other | Source: Ambulatory Visit | Attending: Radiation Oncology | Admitting: Radiation Oncology

## 2020-10-15 DIAGNOSIS — Z510 Encounter for antineoplastic radiation therapy: Secondary | ICD-10-CM | POA: Diagnosis not present

## 2020-10-16 ENCOUNTER — Ambulatory Visit
Admission: RE | Admit: 2020-10-16 | Discharge: 2020-10-16 | Disposition: A | Discharge status: Home / Self Care | Payer: Medicare Other | Source: Ambulatory Visit | Attending: Radiation Oncology | Admitting: Radiation Oncology

## 2020-10-16 ENCOUNTER — Other Ambulatory Visit: Payer: Self-pay

## 2020-10-16 ENCOUNTER — Inpatient Hospital Stay: Payer: Medicare Other | Attending: Hematology and Oncology

## 2020-10-16 DIAGNOSIS — Z510 Encounter for antineoplastic radiation therapy: Secondary | ICD-10-CM | POA: Diagnosis not present

## 2020-10-16 DIAGNOSIS — E538 Deficiency of other specified B group vitamins: Secondary | ICD-10-CM | POA: Diagnosis not present

## 2020-10-16 DIAGNOSIS — Z79899 Other long term (current) drug therapy: Secondary | ICD-10-CM | POA: Diagnosis not present

## 2020-10-16 DIAGNOSIS — C9000 Multiple myeloma not having achieved remission: Secondary | ICD-10-CM | POA: Diagnosis not present

## 2020-10-16 DIAGNOSIS — Z23 Encounter for immunization: Secondary | ICD-10-CM | POA: Diagnosis not present

## 2020-10-16 MED ORDER — CYANOCOBALAMIN 1000 MCG/ML IJ SOLN
1000.0000 ug | Freq: Once | INTRAMUSCULAR | Status: AC
  Administered 2020-10-16: 1000 ug via INTRAMUSCULAR
  Filled 2020-10-16: qty 1

## 2020-10-16 MED ORDER — INFLUENZA VAC A&B SA ADJ QUAD 0.5 ML IM PRSY
0.5000 mL | PREFILLED_SYRINGE | Freq: Once | INTRAMUSCULAR | Status: AC
  Administered 2020-10-16: 0.5 mL via INTRAMUSCULAR

## 2020-10-17 ENCOUNTER — Ambulatory Visit
Admission: RE | Admit: 2020-10-17 | Discharge: 2020-10-17 | Disposition: A | Discharge status: Home / Self Care | Payer: Medicare Other | Source: Ambulatory Visit | Attending: Radiation Oncology | Admitting: Radiation Oncology

## 2020-10-17 DIAGNOSIS — Z510 Encounter for antineoplastic radiation therapy: Secondary | ICD-10-CM | POA: Diagnosis not present

## 2020-10-20 ENCOUNTER — Ambulatory Visit
Admission: RE | Admit: 2020-10-20 | Discharge: 2020-10-20 | Disposition: A | Discharge status: Home / Self Care | Payer: Medicare Other | Source: Ambulatory Visit | Attending: Radiation Oncology | Admitting: Radiation Oncology

## 2020-10-20 DIAGNOSIS — Z510 Encounter for antineoplastic radiation therapy: Secondary | ICD-10-CM | POA: Diagnosis not present

## 2020-10-21 ENCOUNTER — Ambulatory Visit
Admission: RE | Admit: 2020-10-21 | Discharge: 2020-10-21 | Disposition: A | Discharge status: Home / Self Care | Payer: Medicare Other | Source: Ambulatory Visit | Attending: Radiation Oncology | Admitting: Radiation Oncology

## 2020-10-21 DIAGNOSIS — Z510 Encounter for antineoplastic radiation therapy: Secondary | ICD-10-CM | POA: Diagnosis not present

## 2020-10-22 ENCOUNTER — Ambulatory Visit
Admission: RE | Admit: 2020-10-22 | Discharge: 2020-10-22 | Disposition: A | Discharge status: Home / Self Care | Payer: Medicare Other | Source: Ambulatory Visit | Attending: Radiation Oncology | Admitting: Radiation Oncology

## 2020-10-22 DIAGNOSIS — Z510 Encounter for antineoplastic radiation therapy: Secondary | ICD-10-CM | POA: Diagnosis not present

## 2020-10-23 ENCOUNTER — Ambulatory Visit
Admission: RE | Admit: 2020-10-23 | Discharge: 2020-10-23 | Disposition: A | Discharge status: Home / Self Care | Payer: Medicare Other | Source: Ambulatory Visit | Attending: Radiation Oncology | Admitting: Radiation Oncology

## 2020-10-23 DIAGNOSIS — Z510 Encounter for antineoplastic radiation therapy: Secondary | ICD-10-CM | POA: Diagnosis not present

## 2020-10-24 ENCOUNTER — Ambulatory Visit
Admission: RE | Admit: 2020-10-24 | Discharge: 2020-10-24 | Disposition: A | Discharge status: Home / Self Care | Payer: Medicare Other | Source: Ambulatory Visit | Attending: Radiation Oncology | Admitting: Radiation Oncology

## 2020-10-24 ENCOUNTER — Encounter: Payer: Self-pay | Admitting: Dermatology

## 2020-10-24 DIAGNOSIS — Z510 Encounter for antineoplastic radiation therapy: Secondary | ICD-10-CM | POA: Diagnosis not present

## 2020-10-27 ENCOUNTER — Ambulatory Visit
Admission: RE | Admit: 2020-10-27 | Discharge: 2020-10-27 | Disposition: A | Discharge status: Home / Self Care | Payer: Medicare Other | Source: Ambulatory Visit | Attending: Radiation Oncology | Admitting: Radiation Oncology

## 2020-10-27 DIAGNOSIS — Z510 Encounter for antineoplastic radiation therapy: Secondary | ICD-10-CM | POA: Diagnosis not present

## 2020-10-28 ENCOUNTER — Ambulatory Visit
Admission: RE | Admit: 2020-10-28 | Discharge: 2020-10-28 | Disposition: A | Discharge status: Home / Self Care | Payer: Medicare Other | Source: Ambulatory Visit | Attending: Radiation Oncology | Admitting: Radiation Oncology

## 2020-10-28 DIAGNOSIS — Z510 Encounter for antineoplastic radiation therapy: Secondary | ICD-10-CM | POA: Diagnosis not present

## 2020-10-29 ENCOUNTER — Ambulatory Visit
Admission: RE | Admit: 2020-10-29 | Discharge: 2020-10-29 | Disposition: A | Discharge status: Home / Self Care | Payer: Medicare Other | Source: Ambulatory Visit | Attending: Radiation Oncology | Admitting: Radiation Oncology

## 2020-10-29 DIAGNOSIS — Z510 Encounter for antineoplastic radiation therapy: Secondary | ICD-10-CM | POA: Diagnosis not present

## 2020-10-30 ENCOUNTER — Ambulatory Visit
Admission: RE | Admit: 2020-10-30 | Discharge: 2020-10-30 | Disposition: A | Discharge status: Home / Self Care | Payer: Medicare Other | Source: Ambulatory Visit | Attending: Radiation Oncology | Admitting: Radiation Oncology

## 2020-10-30 DIAGNOSIS — Z510 Encounter for antineoplastic radiation therapy: Secondary | ICD-10-CM | POA: Diagnosis not present

## 2020-11-03 ENCOUNTER — Ambulatory Visit
Admission: RE | Admit: 2020-11-03 | Discharge: 2020-11-03 | Disposition: A | Discharge status: Home / Self Care | Payer: Medicare Other | Source: Ambulatory Visit | Attending: Radiation Oncology | Admitting: Radiation Oncology

## 2020-11-03 DIAGNOSIS — Z510 Encounter for antineoplastic radiation therapy: Secondary | ICD-10-CM | POA: Diagnosis not present

## 2020-11-04 ENCOUNTER — Ambulatory Visit
Admission: RE | Admit: 2020-11-04 | Discharge: 2020-11-04 | Disposition: A | Discharge status: Home / Self Care | Payer: Medicare Other | Source: Ambulatory Visit | Attending: Radiation Oncology | Admitting: Radiation Oncology

## 2020-11-04 DIAGNOSIS — A419 Sepsis, unspecified organism: Secondary | ICD-10-CM | POA: Diagnosis not present

## 2020-11-04 DIAGNOSIS — R0602 Shortness of breath: Secondary | ICD-10-CM | POA: Diagnosis not present

## 2020-11-05 ENCOUNTER — Ambulatory Visit: Payer: Medicare Other

## 2020-11-06 ENCOUNTER — Other Ambulatory Visit: Payer: Self-pay

## 2020-11-06 ENCOUNTER — Ambulatory Visit: Payer: Medicare Other

## 2020-11-06 ENCOUNTER — Emergency Department: Payer: Medicare Other

## 2020-11-06 DIAGNOSIS — Y95 Nosocomial condition: Secondary | ICD-10-CM | POA: Diagnosis present

## 2020-11-06 DIAGNOSIS — I110 Hypertensive heart disease with heart failure: Secondary | ICD-10-CM | POA: Diagnosis present

## 2020-11-06 DIAGNOSIS — D6859 Other primary thrombophilia: Secondary | ICD-10-CM | POA: Diagnosis present

## 2020-11-06 DIAGNOSIS — R6520 Severe sepsis without septic shock: Secondary | ICD-10-CM | POA: Diagnosis present

## 2020-11-06 DIAGNOSIS — I5032 Chronic diastolic (congestive) heart failure: Secondary | ICD-10-CM | POA: Diagnosis present

## 2020-11-06 DIAGNOSIS — E119 Type 2 diabetes mellitus without complications: Secondary | ICD-10-CM | POA: Diagnosis present

## 2020-11-06 DIAGNOSIS — Z96641 Presence of right artificial hip joint: Secondary | ICD-10-CM | POA: Diagnosis present

## 2020-11-06 DIAGNOSIS — Z87891 Personal history of nicotine dependence: Secondary | ICD-10-CM

## 2020-11-06 DIAGNOSIS — J189 Pneumonia, unspecified organism: Secondary | ICD-10-CM | POA: Diagnosis present

## 2020-11-06 DIAGNOSIS — M4854XA Collapsed vertebra, not elsewhere classified, thoracic region, initial encounter for fracture: Secondary | ICD-10-CM | POA: Diagnosis present

## 2020-11-06 DIAGNOSIS — R319 Hematuria, unspecified: Secondary | ICD-10-CM | POA: Diagnosis not present

## 2020-11-06 DIAGNOSIS — E785 Hyperlipidemia, unspecified: Secondary | ICD-10-CM | POA: Diagnosis present

## 2020-11-06 DIAGNOSIS — I2510 Atherosclerotic heart disease of native coronary artery without angina pectoris: Secondary | ICD-10-CM | POA: Diagnosis present

## 2020-11-06 DIAGNOSIS — I4891 Unspecified atrial fibrillation: Secondary | ICD-10-CM | POA: Diagnosis present

## 2020-11-06 DIAGNOSIS — J209 Acute bronchitis, unspecified: Secondary | ICD-10-CM | POA: Diagnosis present

## 2020-11-06 DIAGNOSIS — F418 Other specified anxiety disorders: Secondary | ICD-10-CM | POA: Diagnosis present

## 2020-11-06 DIAGNOSIS — J9601 Acute respiratory failure with hypoxia: Secondary | ICD-10-CM | POA: Diagnosis present

## 2020-11-06 DIAGNOSIS — Z8616 Personal history of COVID-19: Secondary | ICD-10-CM

## 2020-11-06 DIAGNOSIS — Z923 Personal history of irradiation: Secondary | ICD-10-CM

## 2020-11-06 DIAGNOSIS — C4442 Squamous cell carcinoma of skin of scalp and neck: Secondary | ICD-10-CM | POA: Diagnosis present

## 2020-11-06 DIAGNOSIS — Z79899 Other long term (current) drug therapy: Secondary | ICD-10-CM

## 2020-11-06 DIAGNOSIS — I252 Old myocardial infarction: Secondary | ICD-10-CM

## 2020-11-06 DIAGNOSIS — J440 Chronic obstructive pulmonary disease with acute lower respiratory infection: Secondary | ICD-10-CM | POA: Diagnosis present

## 2020-11-06 DIAGNOSIS — A419 Sepsis, unspecified organism: Principal | ICD-10-CM | POA: Diagnosis present

## 2020-11-06 DIAGNOSIS — C9000 Multiple myeloma not having achieved remission: Secondary | ICD-10-CM | POA: Diagnosis present

## 2020-11-06 DIAGNOSIS — Z7982 Long term (current) use of aspirin: Secondary | ICD-10-CM

## 2020-11-06 DIAGNOSIS — Z7984 Long term (current) use of oral hypoglycemic drugs: Secondary | ICD-10-CM

## 2020-11-06 DIAGNOSIS — K219 Gastro-esophageal reflux disease without esophagitis: Secondary | ICD-10-CM | POA: Diagnosis present

## 2020-11-06 DIAGNOSIS — Z951 Presence of aortocoronary bypass graft: Secondary | ICD-10-CM

## 2020-11-06 LAB — CBC WITH DIFFERENTIAL/PLATELET
Abs Immature Granulocytes: 0.02 10*3/uL (ref 0.00–0.07)
Basophils Absolute: 0 10*3/uL (ref 0.0–0.1)
Basophils Relative: 1 %
Eosinophils Absolute: 0 10*3/uL (ref 0.0–0.5)
Eosinophils Relative: 0 %
HCT: 37.7 % — ABNORMAL LOW (ref 39.0–52.0)
Hemoglobin: 12.6 g/dL — ABNORMAL LOW (ref 13.0–17.0)
Immature Granulocytes: 0 %
Lymphocytes Relative: 10 %
Lymphs Abs: 0.7 10*3/uL (ref 0.7–4.0)
MCH: 37.4 pg — ABNORMAL HIGH (ref 26.0–34.0)
MCHC: 33.4 g/dL (ref 30.0–36.0)
MCV: 111.9 fL — ABNORMAL HIGH (ref 80.0–100.0)
Monocytes Absolute: 0.7 10*3/uL (ref 0.1–1.0)
Monocytes Relative: 11 %
Neutro Abs: 5.1 10*3/uL (ref 1.7–7.7)
Neutrophils Relative %: 78 %
Platelets: 137 10*3/uL — ABNORMAL LOW (ref 150–400)
RBC: 3.37 MIL/uL — ABNORMAL LOW (ref 4.22–5.81)
RDW: 14 % (ref 11.5–15.5)
Smear Review: NORMAL
WBC: 6.5 10*3/uL (ref 4.0–10.5)
nRBC: 0 % (ref 0.0–0.2)

## 2020-11-06 LAB — BASIC METABOLIC PANEL
Anion gap: 11 (ref 5–15)
BUN: 32 mg/dL — ABNORMAL HIGH (ref 8–23)
CO2: 24 mmol/L (ref 22–32)
Calcium: 9 mg/dL (ref 8.9–10.3)
Chloride: 105 mmol/L (ref 98–111)
Creatinine, Ser: 1 mg/dL (ref 0.61–1.24)
GFR, Estimated: >60 mL/min (ref >60)
Glucose, Bld: 119 mg/dL — ABNORMAL HIGH (ref 70–99)
Potassium: 3.9 mmol/L (ref 3.5–5.1)
Sodium: 140 mmol/L (ref 135–145)

## 2020-11-06 NOTE — ED Triage Notes (Signed)
Cancer pt undergoing chemo and radiation, had covid back in August. Had both vaccines, no known exposure.

## 2020-11-06 NOTE — ED Triage Notes (Signed)
Pt arrives [facility] from home w cc of cough, congestion for past 4 days. FD said 88% on room air, EMS 92% ra. Lung sounds per EMS RLL congested, exp wheezing. 3Lnc while transport 98%. T 98.4 VSS 129/74 HR 70s

## 2020-11-07 ENCOUNTER — Emergency Department: Payer: Medicare Other

## 2020-11-07 ENCOUNTER — Inpatient Hospital Stay
Admission: EM | Admit: 2020-11-07 | Discharge: 2020-11-13 | DRG: 871 | Disposition: A | Discharge status: Skilled Nursing Facility | Payer: Medicare Other | Attending: Hospitalist | Admitting: Hospitalist

## 2020-11-07 ENCOUNTER — Inpatient Hospital Stay: Payer: Medicare Other

## 2020-11-07 DIAGNOSIS — I10 Essential (primary) hypertension: Secondary | ICD-10-CM | POA: Diagnosis not present

## 2020-11-07 DIAGNOSIS — I110 Hypertensive heart disease with heart failure: Secondary | ICD-10-CM | POA: Diagnosis present

## 2020-11-07 DIAGNOSIS — C4492 Squamous cell carcinoma of skin, unspecified: Secondary | ICD-10-CM

## 2020-11-07 DIAGNOSIS — J690 Pneumonitis due to inhalation of food and vomit: Secondary | ICD-10-CM | POA: Diagnosis present

## 2020-11-07 DIAGNOSIS — Y95 Nosocomial condition: Secondary | ICD-10-CM | POA: Diagnosis present

## 2020-11-07 DIAGNOSIS — M4854XA Collapsed vertebra, not elsewhere classified, thoracic region, initial encounter for fracture: Secondary | ICD-10-CM | POA: Diagnosis present

## 2020-11-07 DIAGNOSIS — F418 Other specified anxiety disorders: Secondary | ICD-10-CM | POA: Diagnosis present

## 2020-11-07 DIAGNOSIS — E785 Hyperlipidemia, unspecified: Secondary | ICD-10-CM | POA: Diagnosis not present

## 2020-11-07 DIAGNOSIS — Z8616 Personal history of COVID-19: Secondary | ICD-10-CM | POA: Diagnosis not present

## 2020-11-07 DIAGNOSIS — I4891 Unspecified atrial fibrillation: Secondary | ICD-10-CM | POA: Diagnosis not present

## 2020-11-07 DIAGNOSIS — C9000 Multiple myeloma not having achieved remission: Secondary | ICD-10-CM | POA: Diagnosis present

## 2020-11-07 DIAGNOSIS — R0602 Shortness of breath: Secondary | ICD-10-CM | POA: Diagnosis present

## 2020-11-07 DIAGNOSIS — C4442 Squamous cell carcinoma of skin of scalp and neck: Secondary | ICD-10-CM | POA: Diagnosis present

## 2020-11-07 DIAGNOSIS — Z79899 Other long term (current) drug therapy: Secondary | ICD-10-CM | POA: Diagnosis not present

## 2020-11-07 DIAGNOSIS — J440 Chronic obstructive pulmonary disease with acute lower respiratory infection: Secondary | ICD-10-CM | POA: Diagnosis present

## 2020-11-07 DIAGNOSIS — I252 Old myocardial infarction: Secondary | ICD-10-CM | POA: Diagnosis not present

## 2020-11-07 DIAGNOSIS — I25810 Atherosclerosis of coronary artery bypass graft(s) without angina pectoris: Secondary | ICD-10-CM | POA: Diagnosis present

## 2020-11-07 DIAGNOSIS — D6859 Other primary thrombophilia: Secondary | ICD-10-CM | POA: Diagnosis present

## 2020-11-07 DIAGNOSIS — K219 Gastro-esophageal reflux disease without esophagitis: Secondary | ICD-10-CM | POA: Diagnosis present

## 2020-11-07 DIAGNOSIS — A419 Sepsis, unspecified organism: Secondary | ICD-10-CM | POA: Diagnosis present

## 2020-11-07 DIAGNOSIS — J189 Pneumonia, unspecified organism: Secondary | ICD-10-CM | POA: Diagnosis present

## 2020-11-07 DIAGNOSIS — E119 Type 2 diabetes mellitus without complications: Secondary | ICD-10-CM | POA: Diagnosis present

## 2020-11-07 DIAGNOSIS — Z7984 Long term (current) use of oral hypoglycemic drugs: Secondary | ICD-10-CM | POA: Diagnosis not present

## 2020-11-07 DIAGNOSIS — R778 Other specified abnormalities of plasma proteins: Secondary | ICD-10-CM | POA: Diagnosis not present

## 2020-11-07 DIAGNOSIS — Z96641 Presence of right artificial hip joint: Secondary | ICD-10-CM | POA: Diagnosis present

## 2020-11-07 DIAGNOSIS — I2510 Atherosclerotic heart disease of native coronary artery without angina pectoris: Secondary | ICD-10-CM | POA: Diagnosis present

## 2020-11-07 DIAGNOSIS — I5032 Chronic diastolic (congestive) heart failure: Secondary | ICD-10-CM | POA: Diagnosis not present

## 2020-11-07 DIAGNOSIS — Z87891 Personal history of nicotine dependence: Secondary | ICD-10-CM | POA: Diagnosis not present

## 2020-11-07 DIAGNOSIS — R6520 Severe sepsis without septic shock: Secondary | ICD-10-CM | POA: Diagnosis present

## 2020-11-07 DIAGNOSIS — S22069A Unspecified fracture of T7-T8 vertebra, initial encounter for closed fracture: Secondary | ICD-10-CM | POA: Diagnosis present

## 2020-11-07 DIAGNOSIS — I214 Non-ST elevation (NSTEMI) myocardial infarction: Secondary | ICD-10-CM

## 2020-11-07 DIAGNOSIS — Z951 Presence of aortocoronary bypass graft: Secondary | ICD-10-CM | POA: Diagnosis not present

## 2020-11-07 DIAGNOSIS — Z923 Personal history of irradiation: Secondary | ICD-10-CM | POA: Diagnosis not present

## 2020-11-07 DIAGNOSIS — Z7982 Long term (current) use of aspirin: Secondary | ICD-10-CM | POA: Diagnosis not present

## 2020-11-07 DIAGNOSIS — J9601 Acute respiratory failure with hypoxia: Secondary | ICD-10-CM | POA: Diagnosis present

## 2020-11-07 LAB — RESP PANEL BY RT-PCR (FLU A&B, COVID) ARPGX2
Influenza A by PCR: NEGATIVE
Influenza B by PCR: NEGATIVE
SARS Coronavirus 2 by RT PCR: NEGATIVE

## 2020-11-07 LAB — TROPONIN I (HIGH SENSITIVITY)
Troponin I (High Sensitivity): 167 ng/L (ref <18)
Troponin I (High Sensitivity): 148 ng/L (ref <18)
Troponin I (High Sensitivity): 69 ng/L — ABNORMAL HIGH (ref <18)
Troponin I (High Sensitivity): 57 ng/L — ABNORMAL HIGH (ref <18)
Troponin I (High Sensitivity): 53 ng/L — ABNORMAL HIGH (ref <18)

## 2020-11-07 LAB — BRAIN NATRIURETIC PEPTIDE: B Natriuretic Peptide: 815.1 pg/mL — ABNORMAL HIGH (ref 0.0–100.0)

## 2020-11-07 LAB — GLUCOSE, CAPILLARY
Glucose-Capillary: 159 mg/dL — ABNORMAL HIGH (ref 70–99)
Glucose-Capillary: 202 mg/dL — ABNORMAL HIGH (ref 70–99)
Glucose-Capillary: 206 mg/dL — ABNORMAL HIGH (ref 70–99)
Glucose-Capillary: 182 mg/dL — ABNORMAL HIGH (ref 70–99)

## 2020-11-07 LAB — POC SARS CORONAVIRUS 2 AG -  ED: SARS Coronavirus 2 Ag: NEGATIVE

## 2020-11-07 LAB — LACTIC ACID, PLASMA
Lactic Acid, Venous: 2.7 mmol/L (ref 0.5–1.9)
Lactic Acid, Venous: 2.9 mmol/L (ref 0.5–1.9)
Lactic Acid, Venous: 3.7 mmol/L (ref 0.5–1.9)
Lactic Acid, Venous: 3.6 mmol/L (ref 0.5–1.9)

## 2020-11-07 LAB — HEPARIN LEVEL (UNFRACTIONATED)
Heparin Unfractionated: 0.25 IU/mL — ABNORMAL LOW (ref 0.30–0.70)
Heparin Unfractionated: 0.29 IU/mL — ABNORMAL LOW (ref 0.30–0.70)

## 2020-11-07 LAB — MRSA PCR SCREENING: MRSA by PCR: NEGATIVE

## 2020-11-07 LAB — APTT: aPTT: 36 seconds (ref 24–36)

## 2020-11-07 LAB — PROCALCITONIN: Procalcitonin: 0.21 ng/mL

## 2020-11-07 LAB — PROTIME-INR
INR: 1.4 — ABNORMAL HIGH (ref 0.8–1.2)
Prothrombin Time: 16.5 seconds — ABNORMAL HIGH (ref 11.4–15.2)

## 2020-11-07 MED ORDER — INSULIN ASPART 100 UNIT/ML ~~LOC~~ SOLN: 0.0000 [IU] | Freq: Every day | SUBCUTANEOUS | Status: DC

## 2020-11-07 MED ORDER — ONDANSETRON HCL 4 MG/2ML IJ SOLN: 4.0000 mg | Freq: Three times a day (TID) | INTRAMUSCULAR | Status: DC | PRN

## 2020-11-07 MED ORDER — HEPARIN BOLUS VIA INFUSION
1000.0000 [IU] | Freq: Once | INTRAVENOUS | Status: AC
  Administered 2020-11-07: 1000 [IU] via INTRAVENOUS
  Filled 2020-11-07: qty 1000

## 2020-11-07 MED ORDER — LACTATED RINGERS IV BOLUS
1000.0000 mL | Freq: Once | INTRAVENOUS | Status: AC
  Administered 2020-11-07: 1000 mL via INTRAVENOUS

## 2020-11-07 MED ORDER — VANCOMYCIN HCL 750 MG/150ML IV SOLN
750.0000 mg | Freq: Two times a day (BID) | INTRAVENOUS | Status: DC
  Filled 2020-11-07 (×2): qty 150

## 2020-11-07 MED ORDER — MAGNESIUM SULFATE 2 GM/50ML IV SOLN
2.0000 g | Freq: Once | INTRAVENOUS | Status: AC
  Administered 2020-11-07: 2 g via INTRAVENOUS
  Filled 2020-11-07: qty 50

## 2020-11-07 MED ORDER — SODIUM CHLORIDE 0.9 % IV SOLN
1.0000 g | Freq: Once | INTRAVENOUS | Status: AC
  Administered 2020-11-07: 1 g via INTRAVENOUS
  Filled 2020-11-07: qty 1

## 2020-11-07 MED ORDER — METHYLPREDNISOLONE SODIUM SUCC 125 MG IJ SOLR
125.0000 mg | Freq: Once | INTRAMUSCULAR | Status: AC
  Administered 2020-11-07: 125 mg via INTRAVENOUS
  Filled 2020-11-07: qty 2

## 2020-11-07 MED ORDER — ALBUTEROL SULFATE (2.5 MG/3ML) 0.083% IN NEBU: 2.5000 mg | INHALATION_SOLUTION | RESPIRATORY_TRACT | Status: DC | PRN

## 2020-11-07 MED ORDER — HEPARIN BOLUS VIA INFUSION
4000.0000 [IU] | Freq: Once | INTRAVENOUS | Status: AC
  Administered 2020-11-07: 4000 [IU] via INTRAVENOUS
  Filled 2020-11-07: qty 4000

## 2020-11-07 MED ORDER — VANCOMYCIN HCL 750 MG/150ML IV SOLN
750.0000 mg | Freq: Once | INTRAVENOUS | Status: DC
  Filled 2020-11-07: qty 150

## 2020-11-07 MED ORDER — ACETAMINOPHEN 325 MG PO TABS
650.0000 mg | ORAL_TABLET | Freq: Four times a day (QID) | ORAL | Status: DC | PRN
  Administered 2020-11-09 – 2020-11-10 (×2): 650 mg via ORAL
  Filled 2020-11-07 (×2): qty 2

## 2020-11-07 MED ORDER — IPRATROPIUM-ALBUTEROL 0.5-2.5 (3) MG/3ML IN SOLN
3.0000 mL | RESPIRATORY_TRACT | Status: DC
  Administered 2020-11-07: 3 mL via RESPIRATORY_TRACT
  Filled 2020-11-07: qty 3

## 2020-11-07 MED ORDER — METRONIDAZOLE 500 MG PO TABS
500.0000 mg | ORAL_TABLET | Freq: Once | ORAL | Status: AC
  Administered 2020-11-07: 500 mg via ORAL
  Filled 2020-11-07: qty 1

## 2020-11-07 MED ORDER — OXYCODONE-ACETAMINOPHEN 5-325 MG PO TABS
1.0000 | ORAL_TABLET | ORAL | Status: DC | PRN
  Administered 2020-11-10 – 2020-11-11 (×3): 1 via ORAL
  Filled 2020-11-07 (×4): qty 1

## 2020-11-07 MED ORDER — HEPARIN (PORCINE) 25000 UT/250ML-% IV SOLN
1250.0000 [IU]/h | INTRAVENOUS | Status: DC
  Administered 2020-11-07: 1100 [IU]/h via INTRAVENOUS
  Administered 2020-11-08: 1250 [IU]/h via INTRAVENOUS
  Filled 2020-11-07 (×2): qty 250

## 2020-11-07 MED ORDER — ACETAMINOPHEN 500 MG PO TABS
1000.0000 mg | ORAL_TABLET | Freq: Once | ORAL | Status: AC
  Administered 2020-11-07: 1000 mg via ORAL
  Filled 2020-11-07: qty 2

## 2020-11-07 MED ORDER — METRONIDAZOLE 500 MG PO TABS
500.0000 mg | ORAL_TABLET | Freq: Three times a day (TID) | ORAL | Status: DC
  Administered 2020-11-07 – 2020-11-08 (×3): 500 mg via ORAL
  Filled 2020-11-07 (×6): qty 1

## 2020-11-07 MED ORDER — DM-GUAIFENESIN ER 30-600 MG PO TB12
1.0000 | ORAL_TABLET | Freq: Two times a day (BID) | ORAL | Status: DC | PRN
  Administered 2020-11-08: 1 via ORAL
  Filled 2020-11-07: qty 1

## 2020-11-07 MED ORDER — DILTIAZEM HCL ER COATED BEADS 180 MG PO CP24
180.0000 mg | ORAL_CAPSULE | Freq: Once | ORAL | Status: AC
  Administered 2020-11-07: 180 mg via ORAL
  Filled 2020-11-07: qty 1

## 2020-11-07 MED ORDER — LACTATED RINGERS IV SOLN: INTRAVENOUS | Status: DC

## 2020-11-07 MED ORDER — ASPIRIN 81 MG PO CHEW
81.0000 mg | CHEWABLE_TABLET | Freq: Every day | ORAL | Status: DC
  Administered 2020-11-08 – 2020-11-13 (×6): 81 mg via ORAL
  Filled 2020-11-07 (×6): qty 1

## 2020-11-07 MED ORDER — ASPIRIN 81 MG PO CHEW
324.0000 mg | CHEWABLE_TABLET | Freq: Once | ORAL | Status: AC
  Administered 2020-11-07: 324 mg via ORAL
  Filled 2020-11-07: qty 4

## 2020-11-07 MED ORDER — IPRATROPIUM-ALBUTEROL 0.5-2.5 (3) MG/3ML IN SOLN
3.0000 mL | Freq: Four times a day (QID) | RESPIRATORY_TRACT | Status: DC
  Administered 2020-11-07 – 2020-11-12 (×19): 3 mL via RESPIRATORY_TRACT
  Filled 2020-11-07 (×20): qty 3

## 2020-11-07 MED ORDER — ONDANSETRON HCL 4 MG/2ML IJ SOLN
4.0000 mg | Freq: Once | INTRAMUSCULAR | Status: AC
  Administered 2020-11-07: 4 mg via INTRAVENOUS
  Filled 2020-11-07: qty 2

## 2020-11-07 MED ORDER — SODIUM CHLORIDE 0.9 % IV SOLN
INTRAVENOUS | Status: DC | PRN
  Administered 2020-11-07: 500 mL via INTRAVENOUS
  Administered 2020-11-11: 250 mL via INTRAVENOUS

## 2020-11-07 MED ORDER — HYDRALAZINE HCL 20 MG/ML IJ SOLN: 5.0000 mg | INTRAMUSCULAR | Status: DC | PRN

## 2020-11-07 MED ORDER — VANCOMYCIN HCL IN DEXTROSE 1-5 GM/200ML-% IV SOLN
1000.0000 mg | Freq: Once | INTRAVENOUS | Status: AC
  Administered 2020-11-07: 1000 mg via INTRAVENOUS
  Filled 2020-11-07: qty 200

## 2020-11-07 MED ORDER — EZETIMIBE 10 MG PO TABS
10.0000 mg | ORAL_TABLET | Freq: Every day | ORAL | Status: DC
  Administered 2020-11-08 – 2020-11-11 (×4): 10 mg via ORAL
  Filled 2020-11-07 (×4): qty 1

## 2020-11-07 MED ORDER — INSULIN ASPART 100 UNIT/ML ~~LOC~~ SOLN
0.0000 [IU] | Freq: Three times a day (TID) | SUBCUTANEOUS | Status: DC
  Administered 2020-11-07 (×2): 3 [IU] via SUBCUTANEOUS
  Administered 2020-11-08 (×2): 2 [IU] via SUBCUTANEOUS
  Administered 2020-11-09: 1 [IU] via SUBCUTANEOUS
  Administered 2020-11-10: 2 [IU] via SUBCUTANEOUS
  Administered 2020-11-11: 1 [IU] via SUBCUTANEOUS
  Administered 2020-11-12: 2 [IU] via SUBCUTANEOUS
  Filled 2020-11-07 (×8): qty 1

## 2020-11-07 MED ORDER — IOHEXOL 350 MG/ML SOLN
75.0000 mL | Freq: Once | INTRAVENOUS | Status: AC | PRN
  Administered 2020-11-07: 75 mL via INTRAVENOUS

## 2020-11-07 MED ORDER — SODIUM CHLORIDE 0.9 % IV SOLN
2.0000 g | Freq: Two times a day (BID) | INTRAVENOUS | Status: DC
  Administered 2020-11-07 – 2020-11-08 (×2): 2 g via INTRAVENOUS
  Filled 2020-11-07 (×3): qty 2

## 2020-11-07 MED ORDER — IPRATROPIUM-ALBUTEROL 0.5-2.5 (3) MG/3ML IN SOLN
3.0000 mL | Freq: Once | RESPIRATORY_TRACT | Status: AC
  Administered 2020-11-07: 3 mL via RESPIRATORY_TRACT
  Filled 2020-11-07: qty 3

## 2020-11-07 NOTE — ED Notes (Signed)
Pharmacy contacted regarding missing dose of diltiazem which record states was tubed. Awaiting response.

## 2020-11-07 NOTE — ED Provider Notes (Signed)
[facility] Emergency Department Provider Note  ____________________________________________  Time seen: Approximately 2:09 AM  I have reviewed the triage vital signs and the nursing notes.   HISTORY  Chief Complaint Cough and Nasal Congestion   HPI Luis [name]. is a 74 y.o. 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  Patient reports 3 to 4 days of productive cough, malaise, generalized weakness, congestion, one episode of nonbloody nonbilious emesis, diarrhea.  Today patient started having  chest tightness and shortness of breath.  Patient had Covid previously.  He is fully vaccinated.  No known sick exposures however patient did visit family for Christmas.  He denies pleuritic chest pain, personal or family history of PE or DVT, hemoptysis. No fever.  Patient was transported per EMS who found patient satting 88% on room air  Past Medical History:  Diagnosis Date  . Angina pectoris ([place])   . Anxiety   . Arthritis   . CHF (congestive heart failure) ([place])   . Coronary artery disease   . Depression   . Diabetes mellitus without complication [facility])    Patient takes Metformin.  . Diverticulosis   . GERD (gastroesophageal reflux disease)   . Hyperlipidemia   . Hypertension   . Multiple myeloma ([place])   . Multiple myeloma ([place]) 03/27/2015  . Myocardial infarction [facility]) April 2001   widowmaker  . Shortness of breath dyspnea   . Sleep apnea    No CPAP @ present  . Spinal stenosis   . Squamous cell carcinoma of skin 02/19/2014   Left dorsal hand. WD SCC with superficial infiltration. Re-shaved 04/23/2014. Re-shaved 09/05/2014.  Marland Kitchen Squamous cell carcinoma of skin 09/08/2020   Left ant. scalp. WD SCC.    Patient Active  Problem List   Diagnosis Date Noted  . Squamous cell skin cancer 09/18/2020  . Scalp lesion 08/24/2020  . Maintenance chemotherapy 06/14/2020  . COVID-19 virus infection (vaccinated Pfizer 01/2020) 06/14/2020  . COVID-19 06/14/2020  . Weight loss 11/22/2019  . Compression fracture of body of thoracic vertebra ([place]) 09/19/2019  . Macrocytic anemia 03/25/2019  . Renal insufficiency 03/25/2019  . Abnormal iron saturation 02/18/2019  . Bilateral carotid artery stenosis 03/21/2018  . Connective tissue and disc stenosis of intervertebral foramina of lumbar region 02/06/2018  . Hip strain, initial encounter 02/06/2018  . History of total right hip replacement 02/06/2018  . Obesity (BMI 30-39.9) 02/06/2018  . Goals of care, counseling/discussion 11/18/2017  . Drug-induced neutropenia ([place]) 01/23/2017  . Hypocalcemia 06/28/2016  . Type 2 diabetes mellitus without complication, without long-term current use of insulin ([place]) 02/17/2016  . Arthritis 05/01/2015  . TI (tricuspid incompetence) 04/16/2015  . Benign essential HTN 04/04/2015  . B12 deficiency 03/31/2015  . Diarrhea 03/31/2015  . Multiple myeloma ([place]) 03/27/2015  . 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 MI (mitral incompetence) 09/17/2014  . Obstructive apnea 09/17/2014  . Neuritis or radiculitis due to rupture of lumbar intervertebral disc 08/27/2014  . Lumbar canal stenosis 08/27/2014  . Degenerative arthritis of lumbar spine 08/27/2014  . Degenerative arthritis of hip 08/27/2014  . Arthritis, degenerative 03/28/2014  . Chest pain 03/14/2014  . Generalized weakness 03/14/2014  . Breath shortness 03/14/2014  . 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 01/11/2014  . Combined fat and  carbohydrate induced hyperlipemia 01/11/2014  . Compulsive tobacco user syndrome 01/11/2014  . CAD (coronary artery disease) of artery bypass graft 01/11/2014    Past Surgical History:  Procedure Laterality Date   . CARDIAC CATHETERIZATION    . CARPAL TUNNEL RELEASE    . CATARACT EXTRACTION    . COLONOSCOPY WITH PROPOFOL N/A 11/11/2015   Procedure: COLONOSCOPY WITH PROPOFOL;  Surgeon: Lollie Sails, MD;  Location: [facility] ENDOSCOPY;  Service: Endoscopy;  Laterality: N/A;  . CORONARY ARTERY BYPASS GRAFT    . EYE SURGERY Bilateral    Cataract Extraction  . INGUINAL HERNIA REPAIR    . JOINT REPLACEMENT Right 2008   Right Total Hip Replacement  . PILONIDAL CYST EXCISION    . ROTATOR CUFF REPAIR    . TOTAL HIP ARTHROPLASTY Right   . VENTRAL HERNIA REPAIR N/A 08/15/2015   Procedure: VENTRAL HERNIA REPAIR WITH MESH ;  Surgeon: Leonie Green, MD;  Location: [facility] ORS;  Service: General;  Laterality: N/A;    Prior to Admission medications   Medication Sig Start Date End Date Taking? Authorizing Provider  ALPRAZolam (XANAX) 0.25 MG tablet Take 0.25 mg by mouth at bedtime as needed. for sleep 02/06/15   [provider]  ascorbic acid (VITAMIN C) 500 MG tablet Take 1 tablet (500 mg total) by mouth daily. 06/18/20   Lorella Nimrod, MD  aspirin 81 MG tablet Take 81 mg by mouth daily.     [provider]  Bee Pollen 500 MG CHEW Chew 1 tablet by mouth daily.     [provider]  Calcium Carbonate-Vitamin D 600-400 MG-UNIT chew tablet Chew 1 tablet by mouth 2 (two) times daily.     [provider]  Cyanocobalamin (VITAMIN B-12 IJ) Inject 1 Dose as directed every 30 (thirty) days.     [provider]  ergocalciferol (VITAMIN D2) 50000 UNITS capsule Take 50,000 Units by mouth once a week.    [provider]  glucose blood (ONE TOUCH ULTRA TEST) test strip USE ONCE DAILY. USE AS INSTRUCTED. DX E11.9 10/04/18   [provider]  guaiFENesin-dextromethorphan (ROBITUSSIN DM) 100-10 MG/5ML syrup Take 10 mLs by mouth every 4 (four) hours as needed for cough. Patient not taking: Reported on 09/18/2020 06/18/20   Lorella Nimrod, MD  ibuprofen (ADVIL) 200 MG  tablet Take 200 mg by mouth every 6 (six) hours as needed. Patient not taking: Reported on 09/18/2020    [provider]  losartan (COZAAR) 25 MG tablet Take 25 mg by mouth daily.  09/10/19 09/09/20  [provider]  lovastatin (MEVACOR) 40 MG tablet Take 40 mg by mouth at bedtime.     [provider]  metFORMIN (GLUCOPHAGE) 500 MG tablet 500 mg 2 (two) times daily with a meal.  08/03/16   [provider]  Multiple Vitamin (MULTIVITAMIN WITH MINERALS) TABS tablet Take 1 tablet by mouth daily. 06/18/20   Lorella Nimrod, MD  omeprazole (PRILOSEC) 20 MG capsule Take 20 mg by mouth daily.    [provider]  PARoxetine (PAXIL) 10 MG tablet Take 10 mg by mouth daily.  05/26/20   [provider]  propranolol (INDERAL) 10 MG tablet Take 10 mg by mouth 2 (two) times daily. 11/22/19   [provider]  traMADol (ULTRAM) 50 MG tablet TAKE 1 TABLET BY MOUTH THREE TIMES A DAY AS NEEDED 09/01/18   Karen Kitchens, NP  zinc sulfate 220 (50 Zn) MG capsule Take 1 capsule (220 mg total)  by mouth daily. Patient taking differently: Take 220 mg by mouth daily as needed.  06/18/20   Lorella Nimrod, MD    Allergies Pravachol [pravastatin], Pravastatin sodium, Statins, and Zocor [simvastatin]  Family History  Problem Relation Age of Onset  . Heart disease Father   . Stroke Mother   . Prostate cancer Maternal Grandfather 90    Social History Social History   Tobacco Use  . Smoking status: Former Smoker    Packs/day: 1.50    Years: 40.00    Pack years: 60.00    Types: Cigarettes    Quit date: 02/24/2000    Years since quitting: 20.7  . Smokeless tobacco: Former Systems developer    Quit date: 03/04/2000  Vaping Use  . Vaping Use: Never used  Substance Use Topics  . Alcohol use: No  . Drug use: No    Review of Systems  Constitutional: Negative for fever. + Malaise, generalized weakness Eyes: Negative for visual changes. ENT: Negative for sore throat. +  Congestion Neck: No neck pain  Cardiovascular: Negative for chest pain. + Chest tightness Respiratory: + shortness of breath, cough Gastrointestinal: Negative for abdominal pain. + vomiting and diarrhea. Genitourinary: Negative for dysuria. Musculoskeletal: Negative for back pain. Skin: Negative for rash. Neurological: Negative for headaches, weakness or numbness. Psych: No SI or HI  ____________________________________________   PHYSICAL EXAM:  VITAL SIGNS: Vitals:   11/07/20 0330 11/07/20 0501  BP: 102/63 94/70  Pulse: (!) 113   Resp: (!) 31   Temp:    SpO2: 94%   Temp 99.34F  Constitutional: Alert and oriented, mild respiratory distress. HEENT:      Head: Normocephalic and atraumatic.         Eyes: Conjunctivae are normal. Sclera is non-icteric.       Mouth/Throat: Mucous membranes are moist.       Neck: Supple with no signs of meningismus. Cardiovascular: Regularly irregular rhythm with tachycardic rate Respiratory: Slight increased work of breathing, tachypneic, satting 88% on room air which improved on 2 L nasal cannula, severely diminished air movement bilaterally with faint wheezing  gastrointestinal: Soft, non tender, and non distended. Musculoskeletal:  No edema, cyanosis, or erythema of extremities. Neurologic: Normal speech and language. Face is symmetric. Moving all extremities. No gross focal neurologic deficits are appreciated. Skin: Skin is warm, dry and intact. No rash noted. Psychiatric: Mood and affect are normal. Speech and behavior are normal.  ____________________________________________   LABS (all labs ordered are listed, but only abnormal results are displayed)  Labs Reviewed  CBC WITH DIFFERENTIAL/PLATELET - Abnormal; Notable for the following components:      Result Value   RBC 3.37 (*)    Hemoglobin 12.6 (*)    HCT 37.7 (*)    MCV 111.9 (*)    MCH 37.4 (*)    Platelets 137 (*)    All other components within normal limits  BASIC  METABOLIC PANEL - Abnormal; Notable for the following components:   Glucose, Bld 119 (*)    BUN 32 (*)    All other components within normal limits  TROPONIN I (HIGH SENSITIVITY) - Abnormal; Notable for the following components:   Troponin I (High Sensitivity) 167 (*)    All other components within normal limits  TROPONIN I (HIGH SENSITIVITY) - Abnormal; Notable for the following components:   Troponin I (High Sensitivity) 148 (*)    All other components within normal limits  RESP PANEL BY RT-PCR (FLU A&B, COVID) ARPGX2  PROCALCITONIN  PROTIME-INR  APTT  POC SARS CORONAVIRUS 2 AG -  ED   ____________________________________________  EKG  ED ECG REPORT I, Rudene Re, the attending physician, personally viewed and interpreted this ECG.  Atrial fibrillation, rate of 125, normal QTC, right axis deviation, no ST elevation or depression.  New when compared to prior. ____________________________________________  RADIOLOGY  I have personally reviewed the images performed during this visit and I agree with the Radiologist's read.   Interpretation by Radiologist:  DG Chest 2 View  Result Date: 11/06/2020 CLINICAL DATA:  Cough, congestion, and shortness of breath for 4 days. EXAM: CHEST - 2 VIEW COMPARISON:  06/13/2020 FINDINGS: Postoperative changes in the mediastinum. Mild cardiac enlargement. Emphysematous changes in the lungs. Coarse infiltrates in both lungs, most likely representing chronic fibrosis. Similar appearance to previous study. No definite superimposed infiltration or edema. No pleural effusions. No pneumothorax. Mediastinal contours appear intact. Old bilateral rib fractures. Degenerative changes in the spine and shoulders. 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 No evidence of active pulmonary disease. Electronically Signed   By: Lucienne Capers M.D.   On: 11/06/2020 23:01   CT Angio Chest PE W and/or Wo Contrast  Result Date:  11/07/2020 CLINICAL DATA:  Pulmonary embolism.  Cough, chest congestion EXAM: CT ANGIOGRAPHY CHEST WITH CONTRAST TECHNIQUE: Multidetector CT imaging of the chest was performed using the standard protocol during bolus administration of intravenous contrast. Multiplanar CT image reconstructions and MIPs were obtained to evaluate the vascular anatomy. CONTRAST:  69m OMNIPAQUE IOHEXOL 350 MG/ML SOLN COMPARISON:  06/14/2020 FINDINGS: Cardiovascular: There is excellent opacification of the pulmonary arterial vasculature. No intraluminal filling defect identified to suggest acute pulmonary embolism. The central pulmonary arteries are moderately enlarged in keeping with changes of pulmonary arterial hypertension. Moderate multi-vessel coronary artery calcification. Global cardiac size is mildly enlarged. There is dilation of the right ventricle and right atrium in keeping with elevated right heart pressures. Coronary artery bypass grafting has been performed. No pericardial effusion. Moderate atherosclerotic calcification is seen within the thoracic aorta. No aortic aneurysm identified. Mediastinum/Nodes: Thyroid unremarkable. Several shotty mediastinal lymph nodes are identified within the prevascular, right periaortic, and subcarinal lymph node groups, likely reactive in nature. No frankly pathologic thoracic adenopathy. The esophagus is unremarkable. Lungs/Pleura: Mild centrilobular and paraseptal emphysema is again noted. There is stable pleural calcification and a chronic loculated left pleural effusion again identified with associated rounded atelectasis within the left lower lobe. Stable parenchymal scarring within the basilar lingula, likely posttraumatic or post inflammatory in nature. There has developed, however, new nodular infiltrate within the basilar right lower lobe and right middle lobe likely related to acute infection or aspiration. No pneumothorax. No pleural effusion on the right. Central airways are  widely patent. Asymmetric bronchial wall thickening is seen within the right lower lobe in keeping with airway inflammation. Upper Abdomen: Nodular contour of the liver and hypertrophy of the left hepatic lobe is in keeping with changes of cirrhosis. Cholelithiasis noted. No acute abnormality. Musculoskeletal: There are innumerable lytic lesions seen throughout the visualized axial skeleton in keeping with changes related to the patient's known multiple myeloma. There is a a stable pathologic fracture of T10. There is a new pathologic transverse fracture through the T7 vertebral body which does not appear to involve the posterior elements. No associated listhesis. Mild associated paravertebral soft tissue swelling present suggesting a in acute to subacute fracture. Review of the MIP images confirms the above findings. IMPRESSION: No pulmonary embolism. Moderate dilation of the central pulmonary  arteries in keeping with changes of pulmonary arterial hypertension. Enlargement of the right heart in keeping with elevated right heart pressures. Interval development of nodular infiltrate within the right lung base, infection versus aspiration. Associated bronchial wall thickening within the right lower lobe in keeping with airway inflammation. Chronic loculated small left pleural effusion with associated pleural calcification and rounded atelectasis involving the left lower lobe and lingula, likely post inflammatory or posttraumatic in nature. Innumerable lytic lesions throughout the visualized axial spine in keeping with the patient's known multiple myeloma. New acute to subacute transverse fracture of T7 which does not appear to involve the posterior elements it is not associated with significant listhesis or loss of height. This does, however, involve both the anterior and posterior aspects of the vertebral body and surgical consultation is advised for further management. Stable chronic pathologic fracture of T10.  Aortic Atherosclerosis (ICD10-I70.0) and Emphysema (ICD10-J43.9). Attempts are being made at this time to contact the managing clinician for direct communication. Electronically Signed   By: Fidela Salisbury MD   On: 11/07/2020 04:52     ____________________________________________   PROCEDURES  Procedure(s) performed:yes .1-3 Lead EKG Interpretation Performed by: Rudene Re, MD Authorized by: Rudene Re, MD     Interpretation: abnormal     ECG rate assessment: tachycardic     Rhythm: atrial fibrillation     Ectopy: none     Critical Care performed: yes  CRITICAL CARE Performed by: Rudene Re  ?  Total critical care time: 45 min  Critical care time was exclusive of separately billable procedures and treating other patients.  Critical care was necessary to treat or prevent imminent or life-threatening deterioration.  Critical care was time spent personally by me on the following activities: development of treatment plan with patient and/or surrogate as well as nursing, discussions with consultants, evaluation of patient's response to treatment, examination of patient, obtaining history from patient or surrogate, ordering and performing treatments and interventions, ordering and review of laboratory studies, ordering and review of radiographic studies, pulse oximetry and re-evaluation of patient's condition.  ____________________________________________   INITIAL IMPRESSION / ASSESSMENT AND PLAN / ED COURSE   74 y.o. 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  Patient mild respiratory distress, tachypneic and satting 88% on room air with decreased air movement bilaterally and faint wheezes.  No known history of COPD  however chest x-ray visualized by me consistent with possible undiagnosed COPD.  No obvious infiltrate, confirmed by radiology.  EKG showing new onset of atrial fibrillation with RVR.  Labs showing no leukocytosis, stable mild anemia and thrombocytopenia, no significant electrolyte derangements.  Troponin is elevated at 167 concerning for NSTEMI versus demand ischemia in the setting of new A. fib with RVR plus possible viral infection.  At this time will give IV fluids, Tylenol for low-grade temp of 99.1, and IV magnesium prior to trying to control the rate with a calcium channel blocker or beta-blocker to prevent hypotension.  Will check for Covid and flu.  Patient placed on telemetry for close monitoring.  Will give a DuoNeb treatment and Solu-Medrol.  Will give aspirin.  Old medical records reviewed including patient's most recent visit with oncology.  Ddx afib w/ RVR, viral illness, PNA, PE  _________________________ 5:02 AM on 11/07/2020 -----------------------------------------  CT consistent with pneumonia,  visualized by me confirmed by radiology.  Radiologist called me to let me know the patient has an unstable T7 fracture with no retropulsion or involvement of the spinal canal.  Patient is otherwise neurologically intact.  Will discuss with neurosurgery.  Since patient is a cancer patient current on chemotherapy will cover broadly with cefepime and Vanco.  Radiology discussed possible aspiration pneumonia.  Patient does report having a couple of aspiration events therefore will add Flagyl.  His heart rate is much better controlled blood pressure is trending down.  Will give another bolus fluid and start patient on maintenance.  Will discuss with the hospitalist for admission.  _________________________ 5:07 AM on 11/07/2020 -----------------------------------------  Discussed with Dr. Lacinda Axon from neurosurgery who recommended TLSO brace for ambulation.  Shop has been called for orders to be  placed.  He will see patient during this admission but did not recommend any other interventions at this time.  We will discuss these recommendations with the hospitalist    _____________________________________________ Please note:  Patient was evaluated in Emergency Department today for the symptoms described in the history of present illness. Patient was evaluated in the context of the global COVID-19 pandemic, which necessitated consideration that the patient might be at risk for infection with the SARS-CoV-2 virus that causes COVID-19. Institutional protocols and algorithms that pertain to the evaluation of patients at risk for COVID-19 are in a state of rapid change based on information released by regulatory bodies including the CDC and federal and state organizations. These policies and algorithms were followed during the patient's care in the ED.  Some ED evaluations and interventions may be delayed as a result of limited staffing during the pandemic.   [place] Controlled Substance Database was reviewed by me. ____________________________________________   FINAL CLINICAL IMPRESSION(S) / ED DIAGNOSES   Final diagnoses:  Atrial fibrillation with RVR (HCC)  NSTEMI (non-ST elevated myocardial infarction) ([place])  Pneumonia due to infectious organism, unspecified laterality, unspecified part of lung  Closed fracture of seventh thoracic vertebra, unspecified fracture morphology, initial encounter ([place])      NEW MEDICATIONS STARTED DURING THIS VISIT:  ED Discharge Orders    None       Note:  This document was prepared using Dragon voice recognition software and may include unintentional dictation errors.    Rudene Re, MD 11/07/20 [phone]

## 2020-11-07 NOTE — Progress Notes (Signed)
Notified bedside nurse of need to draw and administer  Blood cultures and abx.  Message sent to beside RN to clarify if abx were started prior to blood cultures drawn and why only one set of blood cultures were ordered. However this RN received report from night shift who started the sepsis protocol and is unable to clarify these things to see if sepsis protocol was actually followed. There is no supporting documentation of any difficulty drawing BC to explain why only one set was ordered and why they were collected after abx were scanned. Beside RN did call lab to see if a second set was drawn and sent to the lab but lab is reporting only one set of blood cultures were received.  Elink will continue to monitor for the duration of 6 hrs.

## 2020-11-07 NOTE — Progress Notes (Addendum)
ANTICOAGULATION CONSULT NOTE - Initial Consult  Pharmacy Consult for Heparin Infusion Indication: atrial fibrillation/NSTEMI  Allergies  Allergen Reactions  . Pravachol [Pravastatin]   . Pravastatin Sodium Other (See Comments)  . Statins Other (See Comments)    Muscle aches  . Zocor [Simvastatin] Other (See Comments)    Muscle aches    Patient Measurements: Height: '5\' 7"'  (170.2 cm) Weight: 78.5 kg (173 lb) IBW/kg (Calculated) : 66.1 Heparin Dosing Weight: 78.5 kg  Vital Signs: Temp: 98.8 F (37.1 C) (12/30 2233) Temp Source: Oral (12/30 2233) BP: 102/63 (12/31 0330) Pulse Rate: 113 (12/31 0330)  Labs: Recent Labs    11/06/20 2237 11/07/20 0235  HGB 12.6*  --   HCT 37.7*  --   PLT 137*  --   CREATININE 1.00  --   TROPONINIHS 167* 148*    Estimated Creatinine Clearance: 58.8 mL/min (by C-G formula based on SCr of 1 mg/dL).   Medical History: Past Medical History:  Diagnosis Date  . Angina pectoris ([place])   . Anxiety   . Arthritis   . CHF (congestive heart failure) ([place])   . Coronary artery disease   . Depression   . Diabetes mellitus without complication [facility])    Patient takes Metformin.  . Diverticulosis   . GERD (gastroesophageal reflux disease)   . Hyperlipidemia   . Hypertension   . Multiple myeloma ([place])   . Multiple myeloma ([place]) 03/27/2015  . Myocardial infarction [facility]) April 2001   widowmaker  . Shortness of breath dyspnea   . Sleep apnea    No CPAP @ present  . Spinal stenosis   . Squamous cell carcinoma of skin 02/19/2014   Left dorsal hand. WD SCC with superficial infiltration. Re-shaved 04/23/2014. Re-shaved 09/05/2014.  Marland Kitchen Squamous cell carcinoma of skin 09/08/2020   Left ant. scalp. WD SCC.    Goal of Therapy:  Heparin level 0.3-0.7 units/ml Monitor platelets by anticoagulation protocol: Yes   Plan:  Order baseline INR and aPTT. Give 4000 units bolus x 1 Start heparin infusion at 1100 units/hr Check anti-Xa level in 8 hours and  daily while on heparin Continue to monitor H&H and platelets  Renda Rolls, PharmD, [facility] 11/07/2020 3:37 AM

## 2020-11-07 NOTE — ED Notes (Signed)
Lab reports trop 167; acuity level changed and charge nurse notified

## 2020-11-07 NOTE — ED Notes (Signed)
RN went to administer Heparin infusion and found patient to be in CT.

## 2020-11-07 NOTE — ED Notes (Signed)
Pharmacy contacted requesting to tube diltiazem for RN to administer.

## 2020-11-07 NOTE — Consult Note (Signed)
Pharmacy Antibiotic Note  Luis [name]. is a 74 y.o. male admitted on 11/07/2020 with pneumonia.  Pharmacy has been consulted for Vancomycin and Cefepime dosing.  ER- code sepsis was initiated. Patient already received Cefepime 1 gram x 1 and Vancomycin 1 gram x 1 in ED  Plan:  Will give additional vancomycin 750 mg x 1 for total loading dose of 1750 mg following by  Maintenance regimen of Vancomycin 750 mg Q12H per nomogram for goal trough of 15-20 mcg/mL  Will give additional cefepime 1 gram  x 1, following by cefepime 2 gram Q12H regimen per renal function  Will continue to follow and monitor for changes in renal function and adjust doses as needed   Height: 5\' 7"  (170.2 cm) Weight: 78.5 kg (173 lb) IBW/kg (Calculated) : 66.1  Temp (24hrs), Avg:98.4 F (36.9 C), Min:97.9 F (36.6 C), Max:98.8 F (37.1 C)  Recent Labs  Lab 11/06/20 2237 11/07/20 0549 11/07/20 0729  WBC 6.5  --   --   CREATININE 1.00  --   --   LATICACIDVEN  --  2.7* 2.9*    Estimated Creatinine Clearance: 58.8 mL/min (by C-G formula based on SCr of 1 mg/dL).    Allergies  Allergen Reactions  . Pravachol [Pravastatin]   . Pravastatin Sodium Other (See Comments)  . Statins Other (See Comments)    Muscle aches  . Zocor [Simvastatin] Other (See Comments)    Muscle aches    Antimicrobials this admission: Metronidazole 500 mg >> 12/31 x1 Cefepime 2 gram  >> 12/31 x 1 Vancomycin 1750 mg >> 12/31 x1 Cefepime 2 gram Q12H >> starting 12/31 Vancomycin 750 mg Q12h >> starting 12/31  Dose adjustments this admission: n/a  Microbiology results: 12/31 BCx: Pending 12/31 MRSA PCR: pending  Thank you for allowing pharmacy to be a part of this patient's care.  1/32, PharmD, BCPS Clinical Pharmacist  11/07/2020 8:30 AM

## 2020-11-07 NOTE — Consult Note (Signed)
Preliminary consultation evaluation by neurosurgery full consult note to follow   Neurosurgery-New Consultation Evaluation 11/07/2020 Luis [name] [id number]  Identifying Statement: Luis Pumphrey. is a 74 y.o. male from [place] [id number] with a newly diagnosed T7 vertebral body fracture in the setting of known multiple myeloma and multiple bony lesions within the spine  Physician Requesting Consultation: No ref. 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 EKG showing new onset of atrial fibrillation with RVR as well as elevated troponin and elevated lactate negative Covid. The patient been admitted for further evaluation around this and potentially an underlying NSTEMI versus ischemia in the setting of new onset A. fib with RVR however patient underwent CT scan imaging of the type chest which had demonstrated an acute T7 vertebral body fracture extending through the vertebral body and into the pedicles concerning for an underlying unstable thoracic fracture. Neurosurgery was consulted for further evaluation. The patient has a known history of multiple myeloma with known stable fracture of T10 and multiple lytic lesions throughout the axial spine.   Past Medical History:  Past Medical History:  Diagnosis Date  . Angina pectoris ([place])   . Anxiety   . Arthritis   . CHF (congestive heart failure) ([place])   . Coronary artery disease   . Depression   . Diabetes mellitus without complication [facility])    Patient takes Metformin.  . Diverticulosis   . GERD (gastroesophageal reflux disease)   .  Hyperlipidemia   . Hypertension   . Multiple myeloma ([place])   . Multiple myeloma ([place]) 03/27/2015  . Myocardial infarction [facility]) April 2001   widowmaker  . Shortness of breath dyspnea   . Sleep apnea    No CPAP @ present  . Spinal stenosis   . Squamous cell carcinoma of skin 02/19/2014   Left dorsal hand. WD SCC with superficial infiltration. Re-shaved 04/23/2014. Re-shaved 09/05/2014.  Marland Kitchen Squamous cell carcinoma of skin 09/08/2020   Left ant. scalp. WD SCC.    Social History: Social History   Socioeconomic History  . Marital status: Married    Spouse name: Not on file  . Number of children: Not on file  . Years of education: Not on file  . Highest education level: Not on file  Occupational History  . Not on file  Tobacco Use  . Smoking status: Former Smoker    Packs/day: 1.50    Years: 40.00    Pack years: 60.00    Types: Cigarettes    Quit date: 02/24/2000    Years since quitting: 20.7  . Smokeless tobacco: Former Systems developer    Quit date: 03/04/2000  Vaping Use  . Vaping Use: Never used  Substance and Sexual Activity  . Alcohol use: No  . Drug use: No  . Sexual activity: Not on file  Other Topics Concern  . Not on file  Social History Narrative  . Not on file   Social Determinants of Health   Financial Resource Strain: Not on file  Food Insecurity: Not on file  Transportation Needs: Not on file  Physical Activity: Not on file  Stress: Not on file  Social Connections: Not on file  Intimate Partner Violence: Not on file   Living arrangements (living alone, with partner): See record  Family History: Family History  Problem Relation Age of Onset  . Heart disease Father   . Stroke Mother   . Prostate cancer Maternal Grandfather 90    Review of Systems:  Review of Systems - General ROS: Negative Psychological ROS: Negative Ophthalmic ROS: Negative ENT ROS: Negative Hematological and Lymphatic ROS: Negative  Endocrine ROS: Negative Respiratory ROS:  Negative Cardiovascular ROS: Negative Gastrointestinal ROS: Negative Genito-Urinary ROS: Negative Musculoskeletal ROS: Negative Neurological ROS: Negative Dermatological ROS: Negative  Physical Exam: BP 111/67 (BP Location: Left Arm)   Pulse 78   Temp (!) 97.5 F (36.4 C) (Oral)   Resp 17   Ht $R'5\' 6"'cG$  (1.676 m)   Wt 85.8 kg   SpO2 98%   BMI 30.52 kg/m  Body mass index is 30.52 kg/m. Body surface area is 2 meters squared. General appearance: Alert, cooperative, in no acute distress Head: Normocephalic, atraumatic Eyes: Normal, EOM intact Oropharynx: Moist without lesions Neck: Supple, no tenderness Heart: Normal, regular rate and rhythm, without murmur Lungs: Clear to auscultation, good air exchange Abdomen: Soft, nondistended Ext: No edema in LE bilaterally, good distal pulses  Neurologic exam:  Mental status: alertness: alert, orientation: person, place, time, affect: normal Speech: fluent and clear Cranial nerves:  II: Visual fields are full by confrontation, no ptosis III/IV/VI: extra-ocular motions intact bilaterally V/VII:no evidence of facial droop or weakness and facial sensation intact VIII: hearing normal XI: trapezius strength symmetric,  sternocleidomastoid strength symmetric XII: tongue strength symmetric  Motor:strength symmetric 5/5, normal muscle mass and tone in all extremities and no pronator drift Sensory: intact to light touch in all extremities Reflexes: 2+ and symmetric bilaterally for arms and legs Coordination: intact finger to nose Gait: normal   Laboratory: Results for orders placed or performed during the hospital encounter of 11/07/20  Resp Panel by RT-PCR (Flu A&B, Covid) Nasopharyngeal Swab   Specimen: Nasopharyngeal Swab; Nasopharyngeal(NP) swabs in vial transport medium  Result Value Ref Range   SARS Coronavirus 2 by RT PCR NEGATIVE NEGATIVE   Influenza A by PCR NEGATIVE NEGATIVE   Influenza B by PCR NEGATIVE NEGATIVE  CBC with  Differential  Result Value Ref Range   WBC 6.5 4.0 - 10.5 K/uL   RBC 3.37 (L) 4.22 - 5.81 MIL/uL   Hemoglobin 12.6 (L) 13.0 - 17.0 g/dL   HCT 37.7 (L) 39.0 - 52.0 %   MCV 111.9 (H) 80.0 - 100.0 fL   MCH 37.4 (H) 26.0 - 34.0 pg   MCHC 33.4 30.0 - 36.0 g/dL   RDW 14.0 11.5 - 15.5 %   Platelets 137 (L) 150 - 400 K/uL   nRBC 0.0 0.0 - 0.2 %   Neutrophils Relative % 78 %   Neutro Abs 5.1 1.7 - 7.7 K/uL   Lymphocytes Relative 10 %   Lymphs Abs 0.7 0.7 - 4.0 K/uL   Monocytes Relative 11 %   Monocytes Absolute 0.7 0.1 - 1.0 K/uL   Eosinophils Relative 0 %   Eosinophils Absolute 0.0 0.0 - 0.5 K/uL   Basophils Relative 1 %   Basophils Absolute 0.0 0.0 - 0.1 K/uL   WBC Morphology MORPHOLOGY UNREMARKABLE    RBC Morphology MORPHOLOGY UNREMARKABLE    Smear Review Normal platelet morphology    Immature Granulocytes 0 %   Abs Immature Granulocytes 0.02 0.00 - 0.07 K/uL  Basic metabolic panel  Result  Value Ref Range   Sodium 140 135 - 145 mmol/L   Potassium 3.9 3.5 - 5.1 mmol/L   Chloride 105 98 - 111 mmol/L   CO2 24 22 - 32 mmol/L   Glucose, Bld 119 (H) 70 - 99 mg/dL   BUN 32 (H) 8 - 23 mg/dL   Creatinine, Ser 1.00 0.61 - 1.24 mg/dL   Calcium 9.0 8.9 - 10.3 mg/dL   GFR, Estimated >60 >60 mL/min   Anion gap 11 5 - 15  Procalcitonin - Baseline  Result Value Ref Range   Procalcitonin 0.21 ng/mL  Protime-INR  Result Value Ref Range   Prothrombin Time 16.5 (H) 11.4 - 15.2 seconds   INR 1.4 (H) 0.8 - 1.2  APTT  Result Value Ref Range   aPTT 36 24 - 36 seconds  Lactic acid, plasma  Result Value Ref Range   Lactic Acid, Venous 2.7 (HH) 0.5 - 1.9 mmol/L  Lactic acid, plasma  Result Value Ref Range   Lactic Acid, Venous 2.9 (HH) 0.5 - 1.9 mmol/L  Brain natriuretic peptide  Result Value Ref Range   B Natriuretic Peptide 815.1 (H) 0.0 - 100.0 pg/mL  Lactic acid, plasma  Result Value Ref Range   Lactic Acid, Venous 3.7 (HH) 0.5 - 1.9 mmol/L  Glucose, capillary  Result Value Ref Range    Glucose-Capillary 159 (H) 70 - 99 mg/dL  POC SARS Coronavirus 2 Ag-ED - Nasal Swab (BD Veritor Kit)  Result Value Ref Range   SARS Coronavirus 2 Ag Negative Negative  Troponin I (High Sensitivity)  Result Value Ref Range   Troponin I (High Sensitivity) 167 (HH) <18 ng/L  Troponin I (High Sensitivity)  Result Value Ref Range   Troponin I (High Sensitivity) 148 (HH) <18 ng/L   I personally reviewed labs  Imaging: CT scan of the chest CT angiography from 11/07/2020 at 4:17 AM reviewed. There appears to be a fracture extending from the inferior endplate of T7 extending superiorly through the entire vertebral body of T7 up to the superior endplate. It does not appear to extend into the lamina nor the pedicles and there is no bony fragment extending into the canal that can be easily visualized. There is no obvious loss of vertebral body height here. There is extensive bony ossification along the anterior thoracic spine with bridging osteophytes as well as a superior cervical thoracic kyphosis. There are also multiple bony lytic lesions throughout the thoracic spine visualized on the CT imaging consistent with his known multiple myeloma. I do not see recent cervical or lumbar imaging for comparison.   The patient has undergone CT scan imaging of the cervical and lumbar spine which also demonstrates multiple lytic lesions throughout the cervical and lumbar spine area.  There are no additional compression fractures or new fractures in this region and the canals are patent.  The patient is pending an MRI of the thoracic area    Impression/Plan:   Impression The patient is a 75 year old male with past medical history significant for multiple myeloma multiple cardiovascular risk factors and history of CABG who presents with respiratory difficulty Covid negative found to have an elevated troponin as well as elevated lactate with new diagnosed atrial fibrillation with RVR found on CT scan imaging to  have a new transverse fracture of the T7 vertebral body. This is a substantial fracture of the T7 vertebral body though reassuringly the fracture does not obviously appear to extend within the lamina transverse process. There is no displacement of  the bony fragments there is no obvious canal compromise. However I would be concerned that this thoracic fracture is unstable in nature given his extent and the extensive bony ossification of the anterior portions of his spine and multiple other lytic lesions throughout the thoracic vertebral bodies. He has a known T10 chronic pathologic compression fracture which is stable.  The patient's neurologic exam is reassuring and grossly full strength in his bilateral lower extremities I do not see any obvious long track signs he has no myelopathy his toes are downgoing he has no hyperreflexia.  1.  Diagnosis: T7 vertebral body fracture  2.  Plan: The patient is pending an MRI of the thoracic spine to evaluate the spinal canal though his neurologic exam is reassuring.   Would recommend the patient be in a TLSO brace when out of bed. Should the MRI prove reassuring we can evaluate the patient as an outpatient for interval CT scan imaging after he heals from his cardiopulmonary difficulties at the moment.  We will arrange this outpatient follow-up and in the meantime I would continue with TLSO bracing while out of bed until follow-up.  Luciano Cutter. Johnney Killian, MD Neurosurgery

## 2020-11-07 NOTE — ED Notes (Signed)
Date and time results received: 11/07/20 0627  Test: Lactic Critical Value: 2.7  Name of Provider Notified: Dr. Don Perking  Orders Received? Or Actions Taken?: No new orders at this time

## 2020-11-07 NOTE — Progress Notes (Signed)
Orthopedic Tech Progress Note Patient Details:  Kamar Callender [birth year] [id number] TLSO Brace has been ordered  Patient ID: Geanie Logan., male   DOB: April 16, 1946, 74 y.o.   MRN: [id number]   Genelle Bal Lucyle Alumbaugh 11/07/2020, 6:03 AM

## 2020-11-07 NOTE — Progress Notes (Incomplete)
CH visited pt. per OR for AD education.  Pt. sitting up in bed when [facility] entered, agreeable to visit.  Pt. shared that he lost his wife Fannie Knee last November after 69yrs of marriage; this loss has weighed heavily on pt. for this last year, contributing to a loss of appetite and loss of weight, he says.  He contracted COVID in Aug. 2021 and says that his sense of taste has not returned and that he continues to feel weak.  Pt. has two sons who live in [place].  Pt. says he and his wife used to attend [employer] in [place] and he would like to return for a visit.

## 2020-11-07 NOTE — ED Notes (Signed)
COVID swab collected and in process at this time.

## 2020-11-07 NOTE — Progress Notes (Signed)
CH visited pt. per OR for AD education.  Pt. sitting up in bed when [facility] entered, agreeable to visit.  Pt. shared that he lost his wife Fannie Knee last November after 36yrs of marriage; this loss has weighed heavily on pt. for this last year, contributing to a loss of appetite and loss of weight, he says.  He contracted COVID in Aug. 2021 and says that his sense of taste has not returned and that he continues to feel weak.  Pt. has two sons who live in [place].  Pt. says he and his wife used to attend [employer] in [place] and he would like to resume attendance there.  CH offered prayer for pt. before being paged to another room.  CH briefly asked pt. who he would want to make medical decisions for him --> he says either of his two sons; they are aware of his wishes re: life prolonging measures.  CH will bring paperwork later today or tomorrow AM.

## 2020-11-07 NOTE — Consult Note (Signed)
CARDIOLOGY CONSULT NOTE               Patient ID: Luis [name]. MRN: [id number] DOB/AGE: 1946-09-23 74 y.o.  Admit date: 11/07/2020 Referring Physician Ivor Costa MD hospitalist Primary Physician Dr. Ginette Pitman primary Primary Cardiologist Fath Reason for Consultation atrial fibrillation  HPI: Patient is a 74 year old male history of hypertension hyperlipidemia diabetes multiple myeloma skin cancer getting XRT known coronary disease known coronary bypass surgery 4098 diastolic congestive heart failure shortness of breath states he started to feel poorly with generalized weakness fatigue over several days with weakness near syncope patient started having nausea vomiting diarrhea so with the weakness and hypoxemia he finally decided to call rescue and come to the emergency room patient had elevated lactic acid troponins were mildly elevated chest x-ray suggested bibasilar Rales and infiltrates.  Denies any significant chest pain weakness is somewhat improved since his initial admission patient had gotten 2 vaccines and also had had Covid about 6 months ago lost his wife back in November 2020 he has been grieving sick weak and not well since.  Review of systems complete and found to be negative unless listed above     Past Medical History:  Diagnosis Date  . Angina pectoris ([place])   . Anxiety   . Arthritis   . CHF (congestive heart failure) ([place])   . Coronary artery disease   . Depression   . Diabetes mellitus without complication [facility])    Patient takes Metformin.  . Diverticulosis   . GERD (gastroesophageal reflux disease)   . Hyperlipidemia   . Hypertension   . Multiple myeloma ([place])   . Multiple myeloma ([place]) 03/27/2015  . Myocardial infarction [facility]) April 2001   widowmaker  . Shortness of breath dyspnea   . Sleep apnea    No CPAP @ present  . Spinal stenosis   . Squamous cell carcinoma of skin 02/19/2014   Left dorsal hand. WD SCC with superficial infiltration. Re-shaved  04/23/2014. Re-shaved 09/05/2014.  Marland Kitchen Squamous cell carcinoma of skin 09/08/2020   Left ant. scalp. WD SCC.    Past Surgical History:  Procedure Laterality Date  . CARDIAC CATHETERIZATION    . CARPAL TUNNEL RELEASE    . CATARACT EXTRACTION    . COLONOSCOPY WITH PROPOFOL N/A 11/11/2015   Procedure: COLONOSCOPY WITH PROPOFOL;  Surgeon: Lollie Sails, MD;  Location: [facility] ENDOSCOPY;  Service: Endoscopy;  Laterality: N/A;  . CORONARY ARTERY BYPASS GRAFT    . EYE SURGERY Bilateral    Cataract Extraction  . INGUINAL HERNIA REPAIR    . JOINT REPLACEMENT Right 2008   Right Total Hip Replacement  . PILONIDAL CYST EXCISION    . ROTATOR CUFF REPAIR    . TOTAL HIP ARTHROPLASTY Right   . VENTRAL HERNIA REPAIR N/A 08/15/2015   Procedure: VENTRAL HERNIA REPAIR WITH MESH ;  Surgeon: Leonie Green, MD;  Location: [facility] ORS;  Service: General;  Laterality: N/A;    Medications Prior to Admission  Medication Sig Dispense Refill Last Dose  . ALPRAZolam (XANAX) 0.25 MG tablet Take 0.25 mg by mouth at bedtime as needed. for sleep  3 prn at prn  . ascorbic acid (VITAMIN C) 500 MG tablet Take 1 tablet (500 mg total) by mouth daily. 90 tablet 0 11/06/2020 at Unknown time  . aspirin 81 MG tablet Take 81 mg by mouth daily.   11/06/2020 at Unknown time  . Bee Pollen 500 MG CHEW Chew 1 tablet by mouth 2 (two) times  daily.   11/06/2020 at Unknown time  . Calcium Carbonate-Vitamin D 600-400 MG-UNIT chew tablet Chew 1 tablet by mouth 2 (two) times daily.    11/06/2020 at Unknown time  . Cyanocobalamin (VITAMIN B-12 IJ) Inject 1 Dose as directed every 30 (thirty) days.    Past Month at Unknown time  . ergocalciferol (VITAMIN D2) 50000 UNITS capsule Take 50,000 Units by mouth once a week. Monday   Past Week at Unknown time  . losartan (COZAAR) 25 MG tablet Take 25 mg by mouth daily.    11/06/2020 at Unknown time  . lovastatin (MEVACOR) 40 MG tablet Take 40 mg by mouth at bedtime.    11/06/2020 at Unknown time  .  metFORMIN (GLUCOPHAGE) 500 MG tablet Take 500 mg by mouth daily with breakfast.   11/06/2020 at Unknown time  . omeprazole (PRILOSEC) 20 MG capsule Take 20 mg by mouth daily.   11/06/2020 at Unknown time  . propranolol (INDERAL) 10 MG tablet Take 10 mg by mouth 2 (two) times daily.   11/06/2020 at Unknown time  . traMADol (ULTRAM) 50 MG tablet TAKE 1 TABLET BY MOUTH THREE TIMES A DAY AS NEEDED 60 tablet 0 prn at prn  . glucose blood test strip USE ONCE DAILY. USE AS INSTRUCTED. DX E11.9     . guaiFENesin-dextromethorphan (ROBITUSSIN DM) 100-10 MG/5ML syrup Take 10 mLs by mouth every 4 (four) hours as needed for cough. (Patient not taking: No sig reported) 118 mL 0 Not Taking at Unknown time  . ibuprofen (ADVIL) 200 MG tablet Take 200 mg by mouth every 6 (six) hours as needed. (Patient not taking: No sig reported)   Not Taking at Unknown time  . Multiple Vitamin (MULTIVITAMIN WITH MINERALS) TABS tablet Take 1 tablet by mouth daily. (Patient not taking: No sig reported) 90 tablet 0 Not Taking at Unknown time  . PARoxetine (PAXIL) 10 MG tablet Take 10 mg by mouth daily.  (Patient not taking: No sig reported)   Not Taking at Unknown time  . zinc sulfate 220 (50 Zn) MG capsule Take 1 capsule (220 mg total) by mouth daily. (Patient not taking: No sig reported) 90 capsule 0 Not Taking at Unknown time   Social History   Socioeconomic History  . Marital status: Married    Spouse name: Not on file  . Number of children: Not on file  . Years of education: Not on file  . Highest education level: Not on file  Occupational History  . Not on file  Tobacco Use  . Smoking status: Former Smoker    Packs/day: 1.50    Years: 40.00    Pack years: 60.00    Types: Cigarettes    Quit date: 02/24/2000    Years since quitting: 20.7  . Smokeless tobacco: Former Systems developer    Quit date: 03/04/2000  Vaping Use  . Vaping Use: Never used  Substance and Sexual Activity  . Alcohol use: No  . Drug use: No  . Sexual  activity: Not on file  Other Topics Concern  . Not on file  Social History Narrative  . Not on file   Social Determinants of Health   Financial Resource Strain: Not on file  Food Insecurity: Not on file  Transportation Needs: Not on file  Physical Activity: Not on file  Stress: Not on file  Social Connections: Not on file  Intimate Partner Violence: Not on file    Family History  Problem Relation Age of Onset  . Heart  disease Father   . Stroke Mother   . Prostate cancer Maternal Grandfather 90      Review of systems complete and found to be negative unless listed above      PHYSICAL EXAM  General: Well developed, well nourished, in no acute distress HEENT:  Normocephalic and atramatic Neck:  No JVD.  Lungs: Clear bilaterally to auscultation and percussion. Heart: Irregular irregular. Normal S1 and S2 without gallops or murmurs.  Abdomen: Bowel sounds are positive, abdomen soft and non-tender  Msk:  Back normal, normal gait. Normal strength and tone for age. Extremities: No clubbing, cyanosis or edema.   Neuro: Alert and oriented X 3. Psych:  Good affect, responds appropriately  Labs:   Lab Results  Component Value Date   WBC 6.5 11/06/2020   HGB 12.6 (L) 11/06/2020   HCT 37.7 (L) 11/06/2020   MCV 111.9 (H) 11/06/2020   PLT 137 (L) 11/06/2020    Recent Labs  Lab 11/06/20 2237  NA 140  K 3.9  CL 105  CO2 24  BUN 32*  CREATININE 1.00  CALCIUM 9.0  GLUCOSE 119*   Lab Results  Component Value Date   CKTOTAL 65 08/06/2013   CKMB 0.8 08/06/2013   TROPONINI < 0.02 08/06/2013   No results found for: CHOL No results found for: HDL No results found for: LDLCALC No results found for: TRIG No results found for: CHOLHDL No results found for: LDLDIRECT    Radiology: DG Chest 2 View  Result Date: 11/06/2020 CLINICAL DATA:  Cough, congestion, and shortness of breath for 4 days. EXAM: CHEST - 2 VIEW COMPARISON:  06/13/2020 FINDINGS: Postoperative changes  in the mediastinum. Mild cardiac enlargement. Emphysematous changes in the lungs. Coarse infiltrates in both lungs, most likely representing chronic fibrosis. Similar appearance to previous study. No definite superimposed infiltration or edema. No pleural effusions. No pneumothorax. Mediastinal contours appear intact. Old bilateral rib fractures. Degenerative changes in the spine and shoulders. 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 No evidence of active pulmonary disease. Electronically Signed   By: Lucienne Capers M.D.   On: 11/06/2020 23:01   CT Angio Chest PE W and/or Wo Contrast  Result Date: 11/07/2020 CLINICAL DATA:  Pulmonary embolism.  Cough, chest congestion EXAM: CT ANGIOGRAPHY CHEST WITH CONTRAST TECHNIQUE: Multidetector CT imaging of the chest was performed using the standard protocol during bolus administration of intravenous contrast. Multiplanar CT image reconstructions and MIPs were obtained to evaluate the vascular anatomy. CONTRAST:  67m OMNIPAQUE IOHEXOL 350 MG/ML SOLN COMPARISON:  06/14/2020 FINDINGS: Cardiovascular: There is excellent opacification of the pulmonary arterial vasculature. No intraluminal filling defect identified to suggest acute pulmonary embolism. The central pulmonary arteries are moderately enlarged in keeping with changes of pulmonary arterial hypertension. Moderate multi-vessel coronary artery calcification. Global cardiac size is mildly enlarged. There is dilation of the right ventricle and right atrium in keeping with elevated right heart pressures. Coronary artery bypass grafting has been performed. No pericardial effusion. Moderate atherosclerotic calcification is seen within the thoracic aorta. No aortic aneurysm identified. Mediastinum/Nodes: Thyroid unremarkable. Several shotty mediastinal lymph nodes are identified within the prevascular, right periaortic, and subcarinal lymph node groups, likely reactive in nature. No frankly  pathologic thoracic adenopathy. The esophagus is unremarkable. Lungs/Pleura: Mild centrilobular and paraseptal emphysema is again noted. There is stable pleural calcification and a chronic loculated left pleural effusion again identified with associated rounded atelectasis within the left lower lobe. Stable parenchymal scarring within the basilar lingula, likely posttraumatic or  post inflammatory in nature. There has developed, however, new nodular infiltrate within the basilar right lower lobe and right middle lobe likely related to acute infection or aspiration. No pneumothorax. No pleural effusion on the right. Central airways are widely patent. Asymmetric bronchial wall thickening is seen within the right lower lobe in keeping with airway inflammation. Upper Abdomen: Nodular contour of the liver and hypertrophy of the left hepatic lobe is in keeping with changes of cirrhosis. Cholelithiasis noted. No acute abnormality. Musculoskeletal: There are innumerable lytic lesions seen throughout the visualized axial skeleton in keeping with changes related to the patient's known multiple myeloma. There is a a stable pathologic fracture of T10. There is a new pathologic transverse fracture through the T7 vertebral body which does not appear to involve the posterior elements. No associated listhesis. Mild associated paravertebral soft tissue swelling present suggesting a in acute to subacute fracture. Review of the MIP images confirms the above findings. IMPRESSION: No pulmonary embolism. Moderate dilation of the central pulmonary arteries in keeping with changes of pulmonary arterial hypertension. Enlargement of the right heart in keeping with elevated right heart pressures. Interval development of nodular infiltrate within the right lung base, infection versus aspiration. Associated bronchial wall thickening within the right lower lobe in keeping with airway inflammation. Chronic loculated small left pleural effusion  with associated pleural calcification and rounded atelectasis involving the left lower lobe and lingula, likely post inflammatory or posttraumatic in nature. Innumerable lytic lesions throughout the visualized axial spine in keeping with the patient's known multiple myeloma. New acute to subacute transverse fracture of T7 which does not appear to involve the posterior elements it is not associated with significant listhesis or loss of height. This does, however, involve both the anterior and posterior aspects of the vertebral body and surgical consultation is advised for further management. Stable chronic pathologic fracture of T10. Aortic Atherosclerosis (ICD10-I70.0) and Emphysema (ICD10-J43.9). Attempts are being made at this time to contact the managing clinician for direct communication. Electronically Signed   By: Fidela Salisbury MD   On: 11/07/2020 04:52    EKG: Atrial fibrillation rate of 77 nonspecific ST-T wave change  ASSESSMENT AND PLAN:  Atrial fibrillation Hospital-acquired pneumonia Multiple myeloma Diabetes type 2 Coronary artery disease Chronic diastolic congestive heart failure GERD COPD Hyperlipidemia Squamous cell cancer of the scalp . Plan Agree with broad-spectrum antibiotic therapy Continue rate control for atrial fibrillation Maintain diabetes management and control History of coronary disease but no evidence of angina at this point Discontinue lovastatin maintain Zetia Maintain aspirin therapy Short-term heparin therapy for anticoagulation Continue supportive care no invasive studies planned                           Signed: Yolonda Kida MD 11/07/2020, 9:50 AM

## 2020-11-07 NOTE — Progress Notes (Signed)
CODE SEPSIS - PHARMACY COMMUNICATION  **Broad Spectrum Antibiotics should be administered within 1 hour of Sepsis diagnosis**  Time Code Sepsis Called/Page Received: 0509  Antibiotics Ordered: Cefepime, Vancomycin, Flagyl  Time of 1st antibiotic administration: 0541  Otelia Sergeant, PharmD, [facility] 11/07/2020 6:05 AM

## 2020-11-07 NOTE — ED Notes (Signed)
Pharmacy contacted x3 requesting diltiazem for administration.

## 2020-11-07 NOTE — Progress Notes (Signed)
[facility] for Heparin Infusion Indication: atrial fibrillation/NSTEMI  Allergies  Allergen Reactions  . Pravachol [Pravastatin]   . Pravastatin Sodium Other (See Comments)  . Statins Other (See Comments)    Muscle aches  . Zocor [Simvastatin] Other (See Comments)    Muscle aches    Patient Measurements: Height: '5\' 6"'  (167.6 cm) Weight: 85.8 kg (189 lb 1.6 oz) IBW/kg (Calculated) : 63.8 Heparin Dosing Weight: 78.5 kg  Vital Signs: Temp: 97.5 F (36.4 C) (12/31 1152) Temp Source: Oral (12/31 1152) BP: 111/67 (12/31 0916) Pulse Rate: 79 (12/31 1152)  Labs: Recent Labs    11/06/20 2237 11/07/20 0235 11/07/20 0450 11/07/20 1020 11/07/20 1248  HGB 12.6*  --   --   --   --   HCT 37.7*  --   --   --   --   PLT 137*  --   --   --   --   APTT  --   --  36  --   --   LABPROT  --   --  16.5*  --   --   INR  --   --  1.4*  --   --   HEPARINUNFRC  --   --   --   --  0.25*  CREATININE 1.00  --   --   --   --   TROPONINIHS 167* 148*  --  69* 57*    Estimated Creatinine Clearance: 64.5 mL/min (by C-G formula based on SCr of 1 mg/dL).   Medical History: Past Medical History:  Diagnosis Date  . Angina pectoris ([place])   . Anxiety   . Arthritis   . CHF (congestive heart failure) ([place])   . Coronary artery disease   . Depression   . Diabetes mellitus without complication [facility])    Patient takes Metformin.  . Diverticulosis   . GERD (gastroesophageal reflux disease)   . Hyperlipidemia   . Hypertension   . Multiple myeloma ([place])   . Multiple myeloma ([place]) 03/27/2015  . Myocardial infarction [facility]) April 2001   widowmaker  . Shortness of breath dyspnea   . Sleep apnea    No CPAP @ present  . Spinal stenosis   . Squamous cell carcinoma of skin 02/19/2014   Left dorsal hand. WD SCC with superficial infiltration. Re-shaved 04/23/2014. Re-shaved 09/05/2014.  Marland Kitchen Squamous cell carcinoma of skin 09/08/2020   Left ant. scalp. WD SCC.     Assessment Pharmacy consulted for heparin for afib/NSTEMI. HR 70-110 received dilt 180 mg x 1. CHADSAVASc 6. F/u with cards consult. Trop trending down.   12/31 1248 HL 0.25   Goal of Therapy:  Heparin level 0.3-0.7 units/ml Monitor platelets by anticoagulation protocol: Yes   Plan:  Heparin level is subtherapeutic. Will give 1000 units bolus and increase heparin infusion rate to 1250 units/hr. Recheck heparin level in 8 hours. CBC daily while on heparin.   Eleonore Chiquito, PharmD 11/07/2020 2:07 PM

## 2020-11-07 NOTE — H&P (Addendum)
History and Physical    Luis [name]. AGT:[id number] DOB: 05/18/1946 DOA: 11/07/2020  Referring MD/NP/PA:   PCP: Tracie Harrier, MD   Patient coming from:  The patient is coming from home.  At baseline, pt is independent for most of ADL.        Chief Complaint: Shortness of breath  HPI: Luis [name]. is a 74 y.o. male with medical history significant of HTN, HLD, DM, MM, scalp skin CA on XRT, CAD, CABG, dCHF, presents with shortness of breath.  Patient is seated he has been having cough, shortness of breath, chest congestion and tightness, generalized weakness for more than 4 days. Patient also reports having a couple of aspiration events.  His shortness breath has been progressively worsening.  Patient reports subjective fever, no chills.  He is temperature is 98.8 8 emergency room. Pt had one episode of nonbloody nonbilious emesis and 1 episode of diarrhea yesterday, no active nausea vomiting, diarrhea or abdominal pain today.  Patient reports mild to moderate middle back pain.  No leg numbness or tingling in extremities.  Patient has oxygen desaturated to 88% on room air, which improved to 94% on 3 L oxygen in ED. Pt was found to have new Afib with RVR in ED   ED Course: pt was found to have WBC 6.5, BMP 815, lactic acid 2.7, 2.9, INR 1.4, PTT 36, troponin level 167, 148, negative Covid PCR, electrolytes renal function okay, temperature normal, blood pressure 99/60, heart rate 126, RR 31, chest x-ray showed emphysema and chronic fibrotic change.  CT angiogram is negative for PE, showed right basilar infiltration, small left loculated pleural effusion, T7 vertebral fracture.  Patient is admitted to progressive bed as inpatient.  Dr. Lacinda Axon of neurosurgery and Dr. Clayborn Bigness of cardiology are consulted.  CTA of chest: No pulmonary embolism.  Moderate dilation of the central pulmonary arteries in keeping with changes of pulmonary arterial hypertension. Enlargement of the  right heart in keeping with elevated right heart pressures.  Interval development of nodular infiltrate within the right lung base, infection versus aspiration. Associated bronchial wall thickening within the right lower lobe in keeping with airway inflammation.  Chronic loculated small left pleural effusion with associated pleural calcification and rounded atelectasis involving the left lower lobe and lingula, likely post inflammatory or posttraumatic in nature.  Innumerable lytic lesions throughout the visualized axial spine in keeping with the patient's known multiple myeloma. New acute to subacute transverse fracture of T7 which does not appear to involve the posterior elements it is not associated with significant listhesis or loss of height. This does, however, involve both the anterior and posterior aspects of the vertebral body and surgical consultation is advised for further management.  Stable chronic pathologic fracture of T10.  Aortic Atherosclerosis (ICD10-I70.0) and Emphysema (ICD10-J43.9).  Attempts are being made at this time to contact the managing clinician for direct communication.   Review of Systems:   General: has subjective fevers, no chills, no body weight gain, has fatigue HEENT: no blurry vision, hearing changes or sore throat Respiratory: has dyspnea, coughing, no wheezing CV: has chest tightness, no palpitations GI: currently no nausea, vomiting, abdominal pain, diarrhea, constipation GU: no dysuria, burning on urination, increased urinary frequency, hematuria  Ext: no leg edema Neuro: no unilateral weakness, numbness, or tingling, no vision change or hearing loss Skin: no rash, no skin tear. MSK: has middle back pain Heme: No easy bruising.  Travel history: No recent long distant travel.  Allergy:  Allergies  Allergen Reactions  . Pravachol [Pravastatin]   . Pravastatin Sodium Other (See Comments)  . Statins Other (See Comments)     Muscle aches  . Zocor [Simvastatin] Other (See Comments)    Muscle aches    Past Medical History:  Diagnosis Date  . Angina pectoris ([place])   . Anxiety   . Arthritis   . CHF (congestive heart failure) ([place])   . Coronary artery disease   . Depression   . Diabetes mellitus without complication [facility])    Patient takes Metformin.  . Diverticulosis   . GERD (gastroesophageal reflux disease)   . Hyperlipidemia   . Hypertension   . Multiple myeloma ([place])   . Multiple myeloma ([place]) 03/27/2015  . Myocardial infarction [facility]) April 2001   widowmaker  . Shortness of breath dyspnea   . Sleep apnea    No CPAP @ present  . Spinal stenosis   . Squamous cell carcinoma of skin 02/19/2014   Left dorsal hand. WD SCC with superficial infiltration. Re-shaved 04/23/2014. Re-shaved 09/05/2014.  Marland Kitchen Squamous cell carcinoma of skin 09/08/2020   Left ant. scalp. WD SCC.    Past Surgical History:  Procedure Laterality Date  . CARDIAC CATHETERIZATION    . CARPAL TUNNEL RELEASE    . CATARACT EXTRACTION    . COLONOSCOPY WITH PROPOFOL N/A 11/11/2015   Procedure: COLONOSCOPY WITH PROPOFOL;  Surgeon: Lollie Sails, MD;  Location: [facility] ENDOSCOPY;  Service: Endoscopy;  Laterality: N/A;  . CORONARY ARTERY BYPASS GRAFT    . EYE SURGERY Bilateral    Cataract Extraction  . INGUINAL HERNIA REPAIR    . JOINT REPLACEMENT Right 2008   Right Total Hip Replacement  . PILONIDAL CYST EXCISION    . ROTATOR CUFF REPAIR    . TOTAL HIP ARTHROPLASTY Right   . VENTRAL HERNIA REPAIR N/A 08/15/2015   Procedure: VENTRAL HERNIA REPAIR WITH MESH ;  Surgeon: Leonie Green, MD;  Location: [facility] ORS;  Service: General;  Laterality: N/A;    Social History:  reports that he quit smoking about 20 years ago. His smoking use included cigarettes. He has a 60.00 pack-year smoking history. He quit smokeless tobacco use about 20 years ago. He reports that he does not drink alcohol and does not use drugs.  Family History:  Family  History  Problem Relation Age of Onset  . Heart disease Father   . Stroke Mother   . Prostate cancer Maternal Grandfather 90     Prior to Admission medications   Medication Sig Start Date End Date Taking? Authorizing Provider  ALPRAZolam (XANAX) 0.25 MG tablet Take 0.25 mg by mouth at bedtime as needed. for sleep 02/06/15  Yes [provider]  ascorbic acid (VITAMIN C) 500 MG tablet Take 1 tablet (500 mg total) by mouth daily. 06/18/20  Yes Lorella Nimrod, MD  aspirin 81 MG tablet Take 81 mg by mouth daily.   Yes [provider]  Bee Pollen 500 MG CHEW Chew 1 tablet by mouth 2 (two) times daily.   Yes [provider]  Calcium Carbonate-Vitamin D 600-400 MG-UNIT chew tablet Chew 1 tablet by mouth 2 (two) times daily.    Yes [provider]  Cyanocobalamin (VITAMIN B-12 IJ) Inject 1 Dose as directed every 30 (thirty) days.    Yes [provider]  ergocalciferol (VITAMIN D2) 50000 UNITS capsule Take 50,000 Units by mouth once a week. Monday   Yes [provider]  losartan (COZAAR) 25 MG tablet Take  25 mg by mouth daily.  09/10/19 11/07/20 Yes [provider]  lovastatin (MEVACOR) 40 MG tablet Take 40 mg by mouth at bedtime.    Yes [provider]  metFORMIN (GLUCOPHAGE) 500 MG tablet Take 500 mg by mouth daily with breakfast. 08/03/16  Yes [provider]  omeprazole (PRILOSEC) 20 MG capsule Take 20 mg by mouth daily.   Yes [provider]  propranolol (INDERAL) 10 MG tablet Take 10 mg by mouth 2 (two) times daily. 11/22/19  Yes [provider]  traMADol (ULTRAM) 50 MG tablet TAKE 1 TABLET BY MOUTH THREE TIMES A DAY AS NEEDED 09/01/18  Yes Honor Loh E, NP  glucose blood test strip USE ONCE DAILY. USE AS INSTRUCTED. DX E11.9 10/04/18   [provider]  guaiFENesin-dextromethorphan (ROBITUSSIN DM) 100-10 MG/5ML syrup Take 10 mLs by mouth every 4 (four) hours as needed for cough. Patient not  taking: No sig reported 06/18/20   Lorella Nimrod, MD  ibuprofen (ADVIL) 200 MG tablet Take 200 mg by mouth every 6 (six) hours as needed. Patient not taking: No sig reported    [provider]  Multiple Vitamin (MULTIVITAMIN WITH MINERALS) TABS tablet Take 1 tablet by mouth daily. Patient not taking: No sig reported 06/18/20   Lorella Nimrod, MD  PARoxetine (PAXIL) 10 MG tablet Take 10 mg by mouth daily.  Patient not taking: No sig reported 05/26/20   [provider]  zinc sulfate 220 (50 Zn) MG capsule Take 1 capsule (220 mg total) by mouth daily. Patient not taking: No sig reported 06/18/20   Lorella Nimrod, MD    Physical Exam: Vitals:   11/07/20 0630 11/07/20 0730 11/07/20 0800 11/07/20 0916  BP: 99/60 102/71 102/75 111/67  Pulse: 92 (!) 104 93 78  Resp: (!) 26 (!) '24 17 17  ' Temp:   97.9 F (36.6 C) (!) 97.5 F (36.4 C)  TempSrc:   Oral Oral  SpO2: 94% 95% 95% 98%  Weight:    85.8 kg  Height:    '5\' 6"'  (1.676 m)   General: Not in acute distress HEENT:       Eyes: PERRL, EOMI, no scleral icterus.       ENT: No discharge from the ears and nose, no pharynx injection, no tonsillar enlargement.        Neck: No JVD, no bruit, no mass felt. Heme: No neck lymph node enlargement. Cardiac: S1/S2, irregularly irregular rhythm, No murmurs, No gallops or rubs. Respiratory: Has coarse breathing sound bilaterally GI: Soft, nondistended, nontender, no rebound pain, no organomegaly, BS present. GU: No hematuria Ext: has trace leg edema bilaterally. 1+DP/PT pulse bilaterally. Musculoskeletal: No joint deformities, No joint redness or warmth, no limitation of ROM in spin. Skin: No rashes.  Neuro: Alert, oriented X3, cranial nerves II-XII grossly intact, moves all extremities normally. Psych: Patient is not psychotic, no suicidal or hemocidal ideation.  Labs on Admission: I have personally reviewed following labs and imaging studies  CBC: Recent Labs  Lab 11/06/20 2237  WBC  6.5  NEUTROABS 5.1  HGB 12.6*  HCT 37.7*  MCV 111.9*  PLT 536*   Basic Metabolic Panel: Recent Labs  Lab 11/06/20 2237  NA 140  K 3.9  CL 105  CO2 24  GLUCOSE 119*  BUN 32*  CREATININE 1.00  CALCIUM 9.0   GFR: Estimated Creatinine Clearance: 64.5 mL/min (by C-G formula based on SCr of 1 mg/dL). Liver Function Tests: No results for input(s): AST, ALT, ALKPHOS, BILITOT,  PROT, ALBUMIN in the last 168 hours. No results for input(s): LIPASE, AMYLASE in the last 168 hours. No results for input(s): AMMONIA in the last 168 hours. Coagulation Profile: Recent Labs  Lab 11/07/20 0450  INR 1.4*   Cardiac Enzymes: No results for input(s): CKTOTAL, CKMB, CKMBINDEX, TROPONINI in the last 168 hours. BNP (last 3 results) No results for input(s): PROBNP in the last 8760 hours. HbA1C: No results for input(s): HGBA1C in the last 72 hours. CBG: Recent Labs  Lab 11/07/20 0926  GLUCAP 159*   Lipid Profile: No results for input(s): CHOL, HDL, LDLCALC, TRIG, CHOLHDL, LDLDIRECT in the last 72 hours. Thyroid Function Tests: No results for input(s): TSH, T4TOTAL, FREET4, T3FREE, THYROIDAB in the last 72 hours. Anemia Panel: No results for input(s): VITAMINB12, FOLATE, FERRITIN, TIBC, IRON, RETICCTPCT in the last 72 hours. Urine analysis:    Component Value Date/Time   COLORURINE Yellow 08/07/2013 0102   APPEARANCEUR Clear 08/07/2013 0102   LABSPEC 1.023 08/07/2013 0102   PHURINE 5.0 08/07/2013 0102   GLUCOSEU 50 mg/dL 08/07/2013 0102   HGBUR Negative 08/07/2013 0102   BILIRUBINUR Negative 08/07/2013 0102   KETONESUR Negative 08/07/2013 0102   PROTEINUR 30 mg/dL 08/07/2013 0102   NITRITE Negative 08/07/2013 0102   LEUKOCYTESUR Negative 08/07/2013 0102   Sepsis Labs: '@LABRCNTIP' (procalcitonin:4,lacticidven:4) ) Recent Results (from the past 240 hour(s))  Resp Panel by RT-PCR (Flu A&B, Covid) Nasopharyngeal Swab     Status: None   Collection Time: 11/07/20  2:35 AM   Specimen:  Nasopharyngeal Swab; Nasopharyngeal(NP) swabs in vial transport medium  Result Value Ref Range Status   SARS Coronavirus 2 by RT PCR NEGATIVE NEGATIVE Final    Comment: (NOTE) SARS-CoV-2 target nucleic acids are NOT DETECTED.  The SARS-CoV-2 RNA is generally detectable in upper respiratory specimens during the acute phase of infection. The lowest concentration of SARS-CoV-2 viral copies this assay can detect is 138 copies/mL. A negative result does not preclude SARS-Cov-2 infection and should not be used as the sole basis for treatment or other patient management decisions. A negative result may occur with  improper specimen collection/handling, submission of specimen other than nasopharyngeal swab, presence of viral mutation(s) within the areas targeted by this assay, and inadequate number of viral copies(<138 copies/mL). A negative result must be combined with clinical observations, patient history, and epidemiological information. The expected result is Negative.  Fact Sheet for Patients:  EntrepreneurPulse.com.au  Fact Sheet for Healthcare Providers:  IncredibleEmployment.be  This test is no t yet approved or cleared by the [place] FDA and  has been authorized for detection and/or diagnosis of SARS-CoV-2 by FDA under an Emergency Use Authorization (EUA). This EUA will remain  in effect (meaning this test can be used) for the duration of the COVID-19 declaration under Section 564(b)(1) of the Act, 21 U.S.C.section 360bbb-3(b)(1), unless the authorization is terminated  or revoked sooner.       Influenza A by PCR NEGATIVE NEGATIVE Final   Influenza B by PCR NEGATIVE NEGATIVE Final    Comment: (NOTE) The Xpert Xpress SARS-CoV-2/FLU/RSV plus assay is intended as an aid in the diagnosis of influenza from Nasopharyngeal swab specimens and should not be used as a sole basis for treatment. Nasal washings and aspirates are unacceptable for  Xpert Xpress SARS-CoV-2/FLU/RSV testing.  Fact Sheet for Patients: EntrepreneurPulse.com.au  Fact Sheet for Healthcare Providers: IncredibleEmployment.be  This test is not yet approved or cleared by the [place] FDA and has been authorized for detection and/or diagnosis of  SARS-CoV-2 by FDA under an Emergency Use Authorization (EUA). This EUA will remain in effect (meaning this test can be used) for the duration of the COVID-19 declaration under Section 564(b)(1) of the Act, 21 U.S.C. section 360bbb-3(b)(1), unless the authorization is terminated or revoked.  Performed at [facility], [address] [place], [place] [address]      Radiological Exams on Admission: DG Chest 2 View  Result Date: 11/06/2020 CLINICAL DATA:  Cough, congestion, and shortness of breath for 4 days. EXAM: CHEST - 2 VIEW COMPARISON:  06/13/2020 FINDINGS: Postoperative changes in the mediastinum. Mild cardiac enlargement. Emphysematous changes in the lungs. Coarse infiltrates in both lungs, most likely representing chronic fibrosis. Similar appearance to previous study. No definite superimposed infiltration or edema. No pleural effusions. No pneumothorax. Mediastinal contours appear intact. Old bilateral rib fractures. Degenerative changes in the spine and shoulders. 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 No evidence of active pulmonary disease. Electronically Signed   By: Lucienne Capers M.D.   On: 11/06/2020 23:01   CT Angio Chest PE W and/or Wo Contrast  Result Date: 11/07/2020 CLINICAL DATA:  Pulmonary embolism.  Cough, chest congestion EXAM: CT ANGIOGRAPHY CHEST WITH CONTRAST TECHNIQUE: Multidetector CT imaging of the chest was performed using the standard protocol during bolus administration of intravenous contrast. Multiplanar CT image reconstructions and MIPs were obtained to evaluate the vascular anatomy. CONTRAST:  42m  OMNIPAQUE IOHEXOL 350 MG/ML SOLN COMPARISON:  06/14/2020 FINDINGS: Cardiovascular: There is excellent opacification of the pulmonary arterial vasculature. No intraluminal filling defect identified to suggest acute pulmonary embolism. The central pulmonary arteries are moderately enlarged in keeping with changes of pulmonary arterial hypertension. Moderate multi-vessel coronary artery calcification. Global cardiac size is mildly enlarged. There is dilation of the right ventricle and right atrium in keeping with elevated right heart pressures. Coronary artery bypass grafting has been performed. No pericardial effusion. Moderate atherosclerotic calcification is seen within the thoracic aorta. No aortic aneurysm identified. Mediastinum/Nodes: Thyroid unremarkable. Several shotty mediastinal lymph nodes are identified within the prevascular, right periaortic, and subcarinal lymph node groups, likely reactive in nature. No frankly pathologic thoracic adenopathy. The esophagus is unremarkable. Lungs/Pleura: Mild centrilobular and paraseptal emphysema is again noted. There is stable pleural calcification and a chronic loculated left pleural effusion again identified with associated rounded atelectasis within the left lower lobe. Stable parenchymal scarring within the basilar lingula, likely posttraumatic or post inflammatory in nature. There has developed, however, new nodular infiltrate within the basilar right lower lobe and right middle lobe likely related to acute infection or aspiration. No pneumothorax. No pleural effusion on the right. Central airways are widely patent. Asymmetric bronchial wall thickening is seen within the right lower lobe in keeping with airway inflammation. Upper Abdomen: Nodular contour of the liver and hypertrophy of the left hepatic lobe is in keeping with changes of cirrhosis. Cholelithiasis noted. No acute abnormality. Musculoskeletal: There are innumerable lytic lesions seen throughout the  visualized axial skeleton in keeping with changes related to the patient's known multiple myeloma. There is a a stable pathologic fracture of T10. There is a new pathologic transverse fracture through the T7 vertebral body which does not appear to involve the posterior elements. No associated listhesis. Mild associated paravertebral soft tissue swelling present suggesting a in acute to subacute fracture. Review of the MIP images confirms the above findings. IMPRESSION: No pulmonary embolism. Moderate dilation of the central pulmonary arteries in keeping with changes of pulmonary arterial hypertension. Enlargement of the right  heart in keeping with elevated right heart pressures. Interval development of nodular infiltrate within the right lung base, infection versus aspiration. Associated bronchial wall thickening within the right lower lobe in keeping with airway inflammation. Chronic loculated small left pleural effusion with associated pleural calcification and rounded atelectasis involving the left lower lobe and lingula, likely post inflammatory or posttraumatic in nature. Innumerable lytic lesions throughout the visualized axial spine in keeping with the patient's known multiple myeloma. New acute to subacute transverse fracture of T7 which does not appear to involve the posterior elements it is not associated with significant listhesis or loss of height. This does, however, involve both the anterior and posterior aspects of the vertebral body and surgical consultation is advised for further management. Stable chronic pathologic fracture of T10. Aortic Atherosclerosis (ICD10-I70.0) and Emphysema (ICD10-J43.9). Attempts are being made at this time to contact the managing clinician for direct communication. Electronically Signed   By: Fidela Salisbury MD   On: 11/07/2020 04:52     EKG: per EDP, EKG showed Atrial fibrillation with RVR, I could not find the EKG strips  Assessment/Plan Principal Problem:    HCAP (healthcare-associated pneumonia) Active Problems:   Multiple myeloma ([place])   Benign essential HTN   Type 2 diabetes mellitus without complication, without long-term current use of insulin (HCC)   CAD (coronary artery disease) of artery bypass graft   Squamous cell skin cancer   Severe sepsis (HCC)   HLD (hyperlipidemia)   GERD (gastroesophageal reflux disease)   Depression with anxiety   Chronic diastolic CHF (congestive heart failure) (HCC)   Elevated troponin   Atrial fibrillation with RVR (HCC)   T7 vertebral fracture (HCC)   Aspiration pneumonia (HCC)  Acute respiratory failure with hypoxia and severe sepsis due to possible HCAP vs. aspiration pneumonia:  CTA negative for PE, but showed right basilar opacity.  Patient has a severe sepsis with tachycardia, tachypnea, elevated lactic acid 2.7, 2.9.  Currently hemodynamically stable.  -Admitted to progressive unit as inpatient - IV Vancomycin and cefepime, flagyl. (if MRSA pcr negative , will d/c vanco) - Mucinex for cough  - Bronchodilators - Urine legionella and S. pneumococcal antigen - Follow up blood culture x2, sputum culture - will get Procalcitonin and trend lactic acid level per sepsis protocol - IVF: 2L of LR bolus in ED, followed by 50 mL per hour of NS (patient has a diastolic congestive heart failure, limiting aggressive IV fluids treatment) - get SLP  New onset A fib with RVR: CHA2DS2-VASc Score is 6, needs oral anticoagulation. IV heparin is started in ED. Dr. Clayborn Bigness of card is consulted. HR 126 -->90s now. -pt received 100 mg of Cardizem in the ED -Continue home propranolol -TSH -get 2d Echo   Elevated troponin and hx of CAD:  S/p of CABG. trop 167 -->148.  Has chest tightness.  Possibly due to demand ischemia -Trend troponin -Check A1c, FLP, follow-up 2D echo -Continue aspirin -IV heparin started in ED -Switch lovastatin to Zetia (patient is allergic to several statins)  T7 vertebral fracture [facility]):  consulted neurosurgeon, Dr. Johnney Killian saw pt.  -prn percocet -TLSO -will get CT of C-spine, CT of L-spine, MRI of the T-spine per Dr. Johnney Killian  Multiple myeloma [facility]): Patient was on Revlimid, taken off few months ago by oncologist -f/u with oncology  Benign essential HTN -IV hydralazine as needed -Continue propranolol -Hold Cozaar since blood pressure is soft and also has sepsis  Type 2 diabetes mellitus without complication, without long-term current use  of insulin [facility]): Recent A1c 5.2, well controlled.  Patient is taking Metformin -Sliding scale insulin  Squamous cell skin cancer: On scalp of left 12 head -Patient is treated with radiation therapy by dermatologist, last treatment was last Tuesday  HLD (hyperlipidemia) -HTN  GERD (gastroesophageal reflux disease) -Protonix  Depression with anxiety: Patient used to be on Paxil, not taking his medications currently -Continue Xanax  Chronic diastolic CHF (congestive heart failure) ([place]): 2D echo on 03/17/2018 showed EF of 50%.  Patient has trace leg edema, no pulmonary edema chest x-ray. BNP is elevated 815, patient does not seem to have CHF exacerbation bow, but is at risk of developing CHF exacerbation. -Will not start diuretics due to sepsis -Watch volume status closely      DVT ppx:   On  IV heparin Code Status: Full code per his son Family Communication: I called his son by phone Disposition Plan:  Anticipate discharge back to previous environment Consults called: Dr. Clayborn Bigness of card and Dr. Johnney Killian of neurosurgery Admission status: Med-surg bed as inpt        Status is: Inpatient  Remains inpatient appropriate because:Inpatient level of care appropriate due to severity of illness.  Patient has multiple comorbidities, now presents with severe sepsis due to possible HCAP versus aspiration pneumonia, also found to have elevated troponin, new onset of atrial fibrillation, T7 vertebral fracture.  His presentation is highly  complicated.  Patient is at high risk of deteriorating.  Will need to be treated in hospital for at least 2 days.  Dispo: The patient is from: Home              Anticipated d/c is to: Home              Anticipated d/c date is: 2 days              Patient currently is not medically stable to d/c.            Date of Service 11/07/2020    Ivor Costa [facility]   If 7PM-7AM, please contact night-coverage [email or web address] 11/07/2020, 9:47 AM

## 2020-11-08 ENCOUNTER — Inpatient Hospital Stay: Payer: Medicare Other

## 2020-11-08 DIAGNOSIS — J189 Pneumonia, unspecified organism: Secondary | ICD-10-CM | POA: Diagnosis not present

## 2020-11-08 LAB — GLUCOSE, CAPILLARY
Glucose-Capillary: 155 mg/dL — ABNORMAL HIGH (ref 70–99)
Glucose-Capillary: 198 mg/dL — ABNORMAL HIGH (ref 70–99)
Glucose-Capillary: 118 mg/dL — ABNORMAL HIGH (ref 70–99)
Glucose-Capillary: 165 mg/dL — ABNORMAL HIGH (ref 70–99)

## 2020-11-08 LAB — PROCALCITONIN: Procalcitonin: 0.32 ng/mL

## 2020-11-08 LAB — BASIC METABOLIC PANEL
Anion gap: 9 (ref 5–15)
BUN: 43 mg/dL — ABNORMAL HIGH (ref 8–23)
CO2: 21 mmol/L — ABNORMAL LOW (ref 22–32)
Calcium: 8.2 mg/dL — ABNORMAL LOW (ref 8.9–10.3)
Chloride: 102 mmol/L (ref 98–111)
Creatinine, Ser: 1.01 mg/dL (ref 0.61–1.24)
GFR, Estimated: >60 mL/min (ref >60)
Glucose, Bld: 170 mg/dL — ABNORMAL HIGH (ref 70–99)
Potassium: 4 mmol/L (ref 3.5–5.1)
Sodium: 132 mmol/L — ABNORMAL LOW (ref 135–145)

## 2020-11-08 LAB — LIPID PANEL
Cholesterol: 97 mg/dL (ref 0–200)
HDL: 45 mg/dL (ref >40)
LDL Cholesterol: 42 mg/dL (ref 0–99)
Total CHOL/HDL Ratio: 2.2 RATIO
Triglycerides: 52 mg/dL (ref <150)
VLDL: 10 mg/dL (ref 0–40)

## 2020-11-08 LAB — CBC
HCT: 31.1 % — ABNORMAL LOW (ref 39.0–52.0)
Hemoglobin: 10.9 g/dL — ABNORMAL LOW (ref 13.0–17.0)
MCH: 38.2 pg — ABNORMAL HIGH (ref 26.0–34.0)
MCHC: 35 g/dL (ref 30.0–36.0)
MCV: 109.1 fL — ABNORMAL HIGH (ref 80.0–100.0)
Platelets: 123 10*3/uL — ABNORMAL LOW (ref 150–400)
RBC: 2.85 MIL/uL — ABNORMAL LOW (ref 4.22–5.81)
RDW: 13.2 % (ref 11.5–15.5)
WBC: 6.6 10*3/uL (ref 4.0–10.5)
nRBC: 0 % (ref 0.0–0.2)

## 2020-11-08 LAB — HEMOGLOBIN A1C
Hgb A1c MFr Bld: 5.3 % (ref 4.8–5.6)
Mean Plasma Glucose: 105.41 mg/dL

## 2020-11-08 LAB — HIV ANTIBODY (ROUTINE TESTING W REFLEX): HIV Screen 4th Generation wRfx: NONREACTIVE

## 2020-11-08 MED ORDER — CALCIUM CARBONATE-VITAMIN D 500-200 MG-UNIT PO TABS
1.0000 | ORAL_TABLET | Freq: Two times a day (BID) | ORAL | Status: DC
  Administered 2020-11-08 – 2020-11-13 (×11): 1 via ORAL
  Filled 2020-11-08 (×11): qty 1

## 2020-11-08 MED ORDER — APIXABAN 5 MG PO TABS
5.0000 mg | ORAL_TABLET | Freq: Two times a day (BID) | ORAL | Status: DC
  Administered 2020-11-08 – 2020-11-13 (×10): 5 mg via ORAL
  Filled 2020-11-08 (×11): qty 1

## 2020-11-08 MED ORDER — AZITHROMYCIN 250 MG PO TABS
500.0000 mg | ORAL_TABLET | Freq: Every day | ORAL | Status: AC
  Administered 2020-11-08 – 2020-11-12 (×5): 500 mg via ORAL
  Filled 2020-11-08 (×5): qty 2

## 2020-11-08 MED ORDER — CEFTRIAXONE SODIUM 2 G IJ SOLR
2.0000 g | INTRAMUSCULAR | Status: DC
  Administered 2020-11-08 – 2020-11-11 (×4): 2 g via INTRAVENOUS
  Filled 2020-11-08 (×3): qty 2
  Filled 2020-11-08: qty 20
  Filled 2020-11-08: qty 2

## 2020-11-08 MED ORDER — ALPRAZOLAM 0.25 MG PO TABS
0.2500 mg | ORAL_TABLET | Freq: Every evening | ORAL | Status: DC | PRN
  Administered 2020-11-08: 0.25 mg via ORAL
  Filled 2020-11-08: qty 1

## 2020-11-08 MED ORDER — CALCIUM CARBONATE-VITAMIN D 600-400 MG-UNIT PO CHEW: 1.0000 | CHEWABLE_TABLET | Freq: Two times a day (BID) | ORAL | Status: DC

## 2020-11-08 MED ORDER — PANTOPRAZOLE SODIUM 40 MG PO TBEC
40.0000 mg | DELAYED_RELEASE_TABLET | Freq: Every day | ORAL | Status: DC
  Administered 2020-11-08 – 2020-11-13 (×6): 40 mg via ORAL
  Filled 2020-11-08 (×6): qty 1

## 2020-11-08 MED ORDER — PROPRANOLOL HCL 10 MG PO TABS
10.0000 mg | ORAL_TABLET | Freq: Two times a day (BID) | ORAL | Status: DC
  Administered 2020-11-08 – 2020-11-12 (×7): 10 mg via ORAL
  Filled 2020-11-08 (×12): qty 1

## 2020-11-08 MED ORDER — BEE POLLEN 500 MG PO CHEW: 1.0000 | CHEWABLE_TABLET | Freq: Two times a day (BID) | ORAL | Status: DC

## 2020-11-08 MED ORDER — ASCORBIC ACID 500 MG PO TABS
500.0000 mg | ORAL_TABLET | Freq: Every day | ORAL | Status: DC
  Administered 2020-11-08 – 2020-11-13 (×6): 500 mg via ORAL
  Filled 2020-11-08 (×6): qty 1

## 2020-11-08 NOTE — Progress Notes (Signed)
PHARMACIST - PHYSICIAN ORDER COMMUNICATION  CONCERNING: P&T Medication Policy on Herbal Medications  DESCRIPTION:  This patient's order for: Bee Pollen Dorna Bloom has been noted.  This product(s) is classified as an "herbal" or natural product. Due to a lack of definitive safety studies or FDA approval, nonstandard manufacturing practices, plus the potential risk of unknown drug-drug interactions while on inpatient medications, the Pharmacy and Therapeutics Committee does not permit the use of "herbal" or natural products of this type within [facility].   ACTION TAKEN: The pharmacy department is unable to verify this order at this time. Please reevaluate patient's clinical condition at discharge and address if the herbal or natural product(s) should be resumed at that time.  Otelia Sergeant, PharmD, [facility] 11/08/2020 3:11 AM

## 2020-11-08 NOTE — Evaluation (Signed)
Clinical/Bedside Swallow Evaluation Patient Details  Name: Luis [name]. MRN: [id number] Date of Birth: 1946/03/26  Today's Date: 11/08/2020 Time: SLP Start Time (ACUTE ONLY): 1025 SLP Stop Time (ACUTE ONLY): 1125 SLP Time Calculation (min) (ACUTE ONLY): 60 min  Past Medical History:  Past Medical History:  Diagnosis Date  . Angina pectoris ([place])   . Anxiety   . Arthritis   . CHF (congestive heart failure) ([place])   . Coronary artery disease   . Depression   . Diabetes mellitus without complication [facility])    Patient takes Metformin.  . Diverticulosis   . GERD (gastroesophageal reflux disease)   . Hyperlipidemia   . Hypertension   . Multiple myeloma ([place])   . Multiple myeloma ([place]) 03/27/2015  . Myocardial infarction [facility]) April 2001   widowmaker  . Shortness of breath dyspnea   . Sleep apnea    No CPAP @ present  . Spinal stenosis   . Squamous cell carcinoma of skin 02/19/2014   Left dorsal hand. WD SCC with superficial infiltration. Re-shaved 04/23/2014. Re-shaved 09/05/2014.  Marland Kitchen Squamous cell carcinoma of skin 09/08/2020   Left ant. scalp. WD SCC.   Past Surgical History:  Past Surgical History:  Procedure Laterality Date  . CARDIAC CATHETERIZATION    . CARPAL TUNNEL RELEASE    . CATARACT EXTRACTION    . COLONOSCOPY WITH PROPOFOL N/A 11/11/2015   Procedure: COLONOSCOPY WITH PROPOFOL;  Surgeon: Lollie Sails, MD;  Location: [facility] ENDOSCOPY;  Service: Endoscopy;  Laterality: N/A;  . CORONARY ARTERY BYPASS GRAFT    . EYE SURGERY Bilateral    Cataract Extraction  . INGUINAL HERNIA REPAIR    . JOINT REPLACEMENT Right 2008   Right Total Hip Replacement  . PILONIDAL CYST EXCISION    . ROTATOR CUFF REPAIR    . TOTAL HIP ARTHROPLASTY Right   . VENTRAL HERNIA REPAIR N/A 08/15/2015   Procedure: VENTRAL HERNIA REPAIR WITH MESH ;  Surgeon: Leonie Green, MD;  Location: [facility] ORS;  Service: General;  Laterality: N/A;   HPI:  Pt is a 75 y.o. male with medical history  significant of HTN, HLD, DM, MM, scalp skin CA on XRT, CAD, CABG, dCHF, presents with shortness of breath. Pt stated he has been having cough, shortness of breath, chest congestion and tightness, generalized weakness for more than 4 days -- after his last XRT tx. He endorsed having difficulty swallowing "certain solid foods"(agreed w/ suggestion of meats/breads from SLP); he denied any difficulty swallowing water at home(but stated he drinks more "Sodas"). At baseline, pt is independent for most of ADLs at home. Pt endorsed Depression post his Wife passing.   Assessment / Plan / Recommendation Clinical Impression  Pt appears to present w/ adequate oropharyngeal phase swallow function w/ No oropharyngeal phase dysphagia noted, No neuromuscular deficits noted. Pt consumed po trials w/ No overt, clinical s/s of aspiration during po trials. Pt appears at reduced risk for aspiration following general aspiration precautions. Pt endorsed he has "difficulty w/ certain foods" and agreed w/ SLP's suggestion of tough meats, breads. Discussed the bulky nature of these foods and the potential slower motility throught the Esophagus. Pt described eating Barbeque and Hamburgers and Salads "fine" when he eats out; he could not describe the last event of difficulty swallowing but denied any trouble swallowing liquids at home. During po trials, pt consumed all consistencies w/ no overt coughing, decline in vocal quality, or change in respiratory presentation during/post trials. Oral phase appeared [facility] w/  timely bolus management, mastication, and control of bolus propulsion for A-P transfer for swallowing. Oral clearing achieved w/ all trial consistencies. OM Exam appeared [facility] w/ no unilateral weakness noted. Speech Clear. Pt fed self w/ setup support. Recommend a Regular consistency diet but w/ well-Cut meats, moistened foods; Thin liquids via Cup if any coughing noted w/ straw use.Marland Kitchen Recommend general aspiration precautions, Pills  WHOLE in Puree for safer, easier swallowing IF needed w/ Larger pills causing difficulty to swallow. Education given on Pills in Puree; food consistencies and easy to eat options; general aspiration precautions; monitoring and/or reducing Bulky foods in diet -- chopping meats well and adding gravy/condiments. NSG to reconsult if any new needs arise. NSG agreed. SLP Visit Diagnosis: Dysphagia, unspecified (R13.10)    Aspiration Risk   (reduced)    Diet Recommendation  Regular-Mech Soft diet -- meats well-Cut and moistened foods/breads; thin liquids. General aspiration and Reflux precautions  Medication Administration: Whole meds with puree (IF needed for ease of swallowing)    Other  Recommendations Recommended Consults: Consider GI evaluation;Consider esophageal assessment (if needed per pt) Oral Care Recommendations: Oral care BID;Patient independent with oral care Other Recommendations:  (n/a)   Follow up Recommendations None      Frequency and Duration  (n/a)   (n/a)       Prognosis Prognosis for Safe Diet Advancement: Good      Swallow Study   General Date of Onset: 11/07/20 HPI: Pt is a 75 y.o. male with medical history significant of HTN, HLD, DM, MM, scalp skin CA on XRT, CAD, CABG, dCHF, presents with shortness of breath. Pt stated he has been having cough, shortness of breath, chest congestion and tightness, generalized weakness for more than 4 days -- after his last XRT tx. He endorsed having difficulty swallowing "certain solid foods"(agreed w/ suggestion of meats/breads from SLP); he denied any difficulty swallowing water at home(but stated he drinks more "Sodas"). At baseline, pt is independent for most of ADLs at home. Pt endorsed Depression post his Wife passing. Type of Study: Bedside Swallow Evaluation Previous Swallow Assessment: none Diet Prior to this Study: Regular;Thin liquids Temperature Spikes Noted: No (wbc 6.6) Respiratory Status: Nasal cannula (2L) History  of Recent Intubation: No Behavior/Cognition: Alert;Cooperative;Pleasant mood (talkative) Oral Cavity Assessment: Within Functional Limits Oral Care Completed by SLP: Recent completion by staff Oral Cavity - Dentition: Adequate natural dentition Vision: Functional for self-feeding Self-Feeding Abilities: Able to feed self Patient Positioning: Upright in chair Baseline Vocal Quality: Normal Volitional Cough: Strong;Congested Volitional Swallow: Able to elicit    Oral/Motor/Sensory Function Overall Oral Motor/Sensory Function: Within functional limits   Ice Chips Ice chips: Not tested   Thin Liquid Thin Liquid: Within functional limits Presentation: Self Fed;Straw (several trials; w/ Pills swallowing too)    Nectar Thick Nectar Thick Liquid: Not tested   Honey Thick Honey Thick Liquid: Not tested   Puree Puree: Within functional limits Presentation: Spoon;Self Fed (4 trials)   Solid     Solid: Within functional limits Presentation: Self Fed;Spoon (completed breakfast foods)       Orinda Kenner, [place], [employer] Speech Language Pathologist Rehab Services [phone] [facility] 11/08/2020,12:23 PM

## 2020-11-08 NOTE — Progress Notes (Signed)
PROGRESS NOTE    Luis A Betsch Jr.  MRN:[id number] DOB: 08/04/1946 DOA: 11/07/2020 PCP: Hande, Vishwanath, MD    Assessment & Plan:   Principal Problem:   HCAP (healthcare-associated pneumonia) Active Problems:   Multiple myeloma (HCC)   Benign essential HTN   Type 2 diabetes mellitus without complication, without long-term current use of insulin (HCC)   CAD (coronary artery disease) of artery bypass graft   Squamous cell skin cancer   Severe sepsis (HCC)   HLD (hyperlipidemia)   GERD (gastroesophageal reflux disease)   Depression with anxiety   Chronic diastolic CHF (congestive heart failure) (HCC)   Elevated troponin   Atrial fibrillation with RVR (HCC)   T7 vertebral fracture (HCC)   Aspiration pneumonia (HCC)    Luis A Deshazer Jr. is a 74 y.o. male with medical history significant of HTN, HLD, DM, MM, scalp skin CA on XRT, CAD, CABG, dCHF, presents with shortness of breath.   Acute respiratory failure with hypoxia    --CTA negative for PE, but showed right basilar opacity.   --Patient has oxygen desaturated to 88% on room air, which improved to 94% on 3 L oxygen in ED. Plan: --treat underlying cause, PNA --Continue supplemental O2 to keep sats >=92%, wean as tolerated  severe sepsis due to possible CAP vs. aspiration pneumonia:  --presented with tachycardia, tachypnea, elevated lactic acid 2.7, 2.9.  source PNA. - started on IV Vancomycin and cefepime, flagyl on admission --procal 0.21 on presentation, 0.32 the next day Plan: --switch to ceftriaxone and azithromycin for CAP treatment - Mucinex for cough  --scheduled DuoNeb --SLP today, found no issue   New onset A fib with RVR:  Acquired thrombophilia  CHA2DS2-VASc Score is 6, needs anticoagulation. IV heparin started in ED. Dr. Callwood of card is consulted. HR 126 -->90s now. -pt received 100 mg of Cardizem in the ED, since rate controlled --cardiology recommended short-term anticoagulation. Plan: -Continue  home propranolol --switch to Eliquis for ppx, will continue for at least a month  Elevated troponin 2/2 demand ischemia hx of CAD S/p of CABG.  trop 167 -->148.  Has chest tightness.  due to demand ischemia Plan: --cont ASA and Zetia (patient is allergic to several statins)  T8 vertebral fracture (HCC):  consulted neurosurgeon, Dr. Codd saw pt.  Plan: -TLSO brace when out of bed --follow up with Neurosurgery  Multiple myeloma (HCC): Patient was on Revlimid, taken off few months ago by oncologist -f/u with oncology  Benign essential HTN -Continue propranolol --cont to hold Cozaar due to soft BP  Type 2 diabetes mellitus without complication, without long-term current use of insulin (HCC):  Recent A1c 5.2, well controlled.  Patient is taking Metformin --hold oral agents while inpatient -Sliding scale insulin  Squamous cell skin cancer:  On scalp  -Patient is treated with radiation therapy by dermatologist, last treatment was last Tuesday  HLD (hyperlipidemia) --cont Zetia (patient is allergic to several statins)  GERD (gastroesophageal reflux disease) -Protonix  Depression with anxiety: Patient used to be on Paxil, not taking his medications currently --cont Xanax PRN  Chronic diastolic CHF (congestive heart failure) (HCC):  2D echo on 03/17/2018 showed EF of 50%.  Patient has trace leg edema, no pulmonary edema chest x-ray. BNP is elevated 815, patient does not seem to have CHF exacerbation bow, but is at risk of developing CHF exacerbation. Plan: --d/c MIVF   DVT prophylaxis: On:Eliquis Code Status: Full code  Family Communication:  Status is: inpatient Dispo:   The patient   is from: home Anticipated d/c is to: home Anticipated d/c date is: 2-3 days Patient currently is not medically stable to d/c due to: hypoxia on IV abx for CAP   Subjective and Interval History:  Pt reported a lot of coughing, with cumulative large amount of sputum production.   Admitted to some back pain.   Objective: Vitals:   11/08/20 1703 11/08/20 1932 11/08/20 2031 11/09/20 0337  BP: 120/61 119/66  120/71  Pulse: (!) 57 (!) 58  (!) 59  Resp: 18 20  19  Temp: 97.7 F (36.5 C) 98 F (36.7 C)  97.8 F (36.6 C)  TempSrc: Oral Oral  Oral  SpO2: 97% 96% 97% 92%  Weight:    85.9 kg  Height:        Intake/Output Summary (Last 24 hours) at 11/09/2020 0550 Last data filed at 11/08/2020 1815 Gross per 24 hour  Intake 2043.2 ml  Output --  Net 2043.2 ml   Filed Weights   11/07/20 0916 11/08/20 0146 11/09/20 0337  Weight: 85.8 kg 83.8 kg 85.9 kg    Examination:   Constitutional: NAD, AAOx3 HEENT: conjunctivae and lids normal, EOMI CV: No cyanosis.   RESP: frequent cough, on 2L Extremities: No effusions, edema in BLE SKIN: warm, dry.  Ulcerated lesion on top of the scalp Neuro: II - XII grossly intact.   Psych: Normal mood and affect.  Appropriate judgement and reason   Data Reviewed: I have personally reviewed following labs and imaging studies  CBC: Recent Labs  Lab 11/06/20 2237 11/08/20 0524  WBC 6.5 6.6  NEUTROABS 5.1  --   HGB 12.6* 10.9*  HCT 37.7* 31.1*  MCV 111.9* 109.1*  PLT 137* 123*   Basic Metabolic Panel: Recent Labs  Lab 11/06/20 2237 11/08/20 0524  NA 140 132*  K 3.9 4.0  CL 105 102  CO2 24 21*  GLUCOSE 119* 170*  BUN 32* 43*  CREATININE 1.00 1.01  CALCIUM 9.0 8.2*   GFR: Estimated Creatinine Clearance: 63.9 mL/min (by C-G formula based on SCr of 1.01 mg/dL). Liver Function Tests: No results for input(s): AST, ALT, ALKPHOS, BILITOT, PROT, ALBUMIN in the last 168 hours. No results for input(s): LIPASE, AMYLASE in the last 168 hours. No results for input(s): AMMONIA in the last 168 hours. Coagulation Profile: Recent Labs  Lab 11/07/20 0450  INR 1.4*   Cardiac Enzymes: No results for input(s): CKTOTAL, CKMB, CKMBINDEX, TROPONINI in the last 168 hours. BNP (last 3 results) No results for input(s): PROBNP in  the last 8760 hours. HbA1C: Recent Labs    11/08/20 0524  HGBA1C 5.3   CBG: Recent Labs  Lab 11/07/20 2132 11/08/20 0753 11/08/20 1127 11/08/20 1706 11/08/20 2105  GLUCAP 182* 155* 198* 118* 165*   Lipid Profile: Recent Labs    11/08/20 0524  CHOL 97  HDL 45  LDLCALC 42  TRIG 52  CHOLHDL 2.2   Thyroid Function Tests: No results for input(s): TSH, T4TOTAL, FREET4, T3FREE, THYROIDAB in the last 72 hours. Anemia Panel: No results for input(s): VITAMINB12, FOLATE, FERRITIN, TIBC, IRON, RETICCTPCT in the last 72 hours. Sepsis Labs: Recent Labs  Lab 11/07/20 0235 11/07/20 0549 11/07/20 0729 11/07/20 1020 11/07/20 1248 11/08/20 0524  PROCALCITON 0.21  --   --   --   --  0.32  LATICACIDVEN  --  2.7* 2.9* 3.7* 3.6*  --     Recent Results (from the past 240 hour(s))  Resp Panel by RT-PCR (Flu A&B, Covid) Nasopharyngeal Swab       Status: None   Collection Time: 11/07/20  2:35 AM   Specimen: Nasopharyngeal Swab; Nasopharyngeal(NP) swabs in vial transport medium  Result Value Ref Range Status   SARS Coronavirus 2 by RT PCR NEGATIVE NEGATIVE Final    Comment: (NOTE) SARS-CoV-2 target nucleic acids are NOT DETECTED.  The SARS-CoV-2 RNA is generally detectable in upper respiratory specimens during the acute phase of infection. The lowest concentration of SARS-CoV-2 viral copies this assay can detect is 138 copies/mL. A negative result does not preclude SARS-Cov-2 infection and should not be used as the sole basis for treatment or other patient management decisions. A negative result may occur with  improper specimen collection/handling, submission of specimen other than nasopharyngeal swab, presence of viral mutation(s) within the areas targeted by this assay, and inadequate number of viral copies(<138 copies/mL). A negative result must be combined with clinical observations, patient history, and epidemiological information. The expected result is Negative.  Fact  Sheet for Patients:  https://www.fda.gov/media/152166/download  Fact Sheet for Healthcare Providers:  https://www.fda.gov/media/152162/download  This test is no t yet approved or cleared by the [place] FDA and  has been authorized for detection and/or diagnosis of SARS-CoV-2 by FDA under an Emergency Use Authorization (EUA). This EUA will remain  in effect (meaning this test can be used) for the duration of the COVID-19 declaration under Section 564(b)(1) of the Act, 21 U.S.C.section 360bbb-3(b)(1), unless the authorization is terminated  or revoked sooner.       Influenza A by PCR NEGATIVE NEGATIVE Final   Influenza B by PCR NEGATIVE NEGATIVE Final    Comment: (NOTE) The Xpert Xpress SARS-CoV-2/FLU/RSV plus assay is intended as an aid in the diagnosis of influenza from Nasopharyngeal swab specimens and should not be used as a sole basis for treatment. Nasal washings and aspirates are unacceptable for Xpert Xpress SARS-CoV-2/FLU/RSV testing.  Fact Sheet for Patients: https://www.fda.gov/media/152166/download  Fact Sheet for Healthcare Providers: https://www.fda.gov/media/152162/download  This test is not yet approved or cleared by the [place] FDA and has been authorized for detection and/or diagnosis of SARS-CoV-2 by FDA under an Emergency Use Authorization (EUA). This EUA will remain in effect (meaning this test can be used) for the duration of the COVID-19 declaration under Section 564(b)(1) of the Act, 21 U.S.C. section 360bbb-3(b)(1), unless the authorization is terminated or revoked.  Performed at [facility], [address] [place], [place] [address]   Culture, blood (single)     Status: None (Preliminary result)   Collection Time: 11/07/20  5:50 AM   Specimen: BLOOD  Result Value Ref Range Status   Specimen Description BLOOD LFA  Final   Special Requests   Final    BOTTLES DRAWN AEROBIC AND ANAEROBIC Blood Culture adequate volume    Culture   Final    NO GROWTH 1 DAY Performed at [facility], [address] [place], [place] [address]    Report Status PENDING  Incomplete  MRSA PCR Screening     Status: None   Collection Time: 11/07/20 12:39 PM   Specimen: Nasal Mucosa; Nasopharyngeal  Result Value Ref Range Status   MRSA by PCR NEGATIVE NEGATIVE Final    Comment:        The GeneXpert MRSA Assay (FDA approved for NASAL specimens only), is one component of a comprehensive MRSA colonization surveillance program. It is not intended to diagnose MRSA infection nor to guide or monitor treatment for MRSA infections. Performed at [facility], [address] [place], [place] [address]         Radiology Studies: CT CERVICAL SPINE WO CONTRAST  Result Date: 11/07/2020 CLINICAL DATA:  History of multiple myeloma. Mid back pain. T7 fracture. EXAM: CT CERVICAL SPINE WITHOUT CONTRAST TECHNIQUE: Multidetector CT imaging of the cervical spine was performed without intravenous contrast. Multiplanar CT image reconstructions were also generated. COMPARISON:  None. FINDINGS: Alignment: Normal alignment. Skull base and vertebrae: Diffuse bone demineralization with multiple lucent lesions throughout the cervical spine consistent known multiple myeloma. Bridging anterior and posterior osteophytes consistent with ankylosis. No acute vertebral compression deformities are identified Soft tissues and spinal canal: No paraspinal soft tissue mass or infiltration identified. No prevertebral soft tissue swelling. Carotid vascular calcifications. Disc levels: Diffuse disc space narrowing with endplate hypertrophic changes consistent with diffuse degenerative change. Upper chest: Visualized portions of the upper lungs demonstrate scarring in the lung apices with left apical pleural thickening and calcification, likely postinflammatory. Other: None. IMPRESSION: 1. Diffuse bone demineralization with multiple lucent lesions  throughout the cervical spine consistent with known multiple myeloma. 2. Bridging anterior and posterior osteophytes consistent with ankylosis. 3. No acute displaced fractures identified. 4. Carotid vascular calcifications. 5. Scarring in the lung apices with left apical pleural thickening and calcification, likely postinflammatory. Electronically Signed   By: William  Stevens M.D.   On: 11/07/2020 15:22   CT LUMBAR SPINE WO CONTRAST  Result Date: 11/07/2020 CLINICAL DATA:  Mid back pain.  T7 fracture. EXAM: CT LUMBAR SPINE WITHOUT CONTRAST TECHNIQUE: Multidetector CT imaging of the lumbar spine was performed without intravenous contrast administration. Multiplanar CT image reconstructions were also generated. COMPARISON:  CT chest 11/07/2020.  MRI lumbar spine 04/06/2019 FINDINGS: Segmentation: 5 lumbar type vertebral bodies. Alignment: Normal alignment.  No anterior subluxations. Vertebrae: Diffuse heterogeneous bone demineralization with multiple lucent lesions demonstrated throughout all lumbar segments and the sacrum and visualized pelvis. Changes are consistent with history of multiple myeloma. No acute vertebral compression deformities. Paraspinal and other soft tissues: No paraspinal soft tissue mass or infiltration demonstrated. Fatty atrophy of the posterior paraspinal muscles. Calcification aorta. Suggestion of gallstones. Disc levels: Degenerative changes with narrowed interspaces and endplate hypertrophic changes throughout. Degenerative changes most prominent at T12-L1 level. IMPRESSION: 1. No acute displaced fractures identified in the lumbar spine. Multiple lucent lesions demonstrated throughout the visualized skeletal components consistent with history of multiple myeloma. 2. Degenerative changes throughout the lumbar spine. 3. Suggestion of gallstones. Aortic Atherosclerosis (ICD10-I70.0). Electronically Signed   By: William  Stevens M.D.   On: 11/07/2020 15:19   MR THORACIC SPINE WO  CONTRAST  Result Date: 11/08/2020 CLINICAL DATA:  74-year-old male with multiple myeloma undergoing treatment. 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 Cough. T7 vertebral fracture on recent CTA. EXAM: MRI THORACIC SPINE WITHOUT CONTRAST TECHNIQUE: Multiplanar, multisequence MR imaging of the thoracic spine was performed. No intravenous contrast was administered. COMPARISON:  CTA chest 11/07/2020.  Lumbar spine CT 11/07/2020. FINDINGS: Limited cervical spine imaging: Straightening of cervical lordosis, otherwise grossly normal. Thoracic spine segmentation: Normal, although the abnormal thoracic spine on recent chest CTA was miss numbered, the abnormal vertebra is T8. Alignment:  Exaggerated thoracic kyphosis.  No spondylolisthesis. Vertebrae: Severe marrow edema throughout the fractured T8 vertebral body, and patchy severe edema also in the adjacent T9 anterior superior endplate. Trace marrow edema in the inferior posterior T7 endplate. And the comparison chest CTA demonstrates widespread thoracic spine ankylosis with the oblique fracture through T8 superimposed on underlying interbody ankylosis. However, there does not appear to be associated posterior element or posterior column disruption on either these images or   the CT yesterday. Chronic moderate to severe T11 compression fracture superimposed. At other thoracic and visible upper lumbar levels marrow signal is within normal limits. However, there are occasional expansile posterior rib lesions (right posterior 8th rib on series 23, image 35). Cord: No spinal cord signal abnormality is identified. But note extensive thoracic epidural lipomatosis (maximal T5 through T7 series 21, image 16) effacing CSF from the thecal sac. Furthermore, heterogeneous increased STIR signal in the dorsal spinal canal at T5 through T7 is suspicious for epidural or subdural blood products. See series 22, image 16 and series 23, image 18. This contributes to the mild  spinal stenosis at those levels. Conus medullaris appears to remain normal at T12-L1. Paraspinal and other soft tissues: Confluent right lateral paraspinal soft tissue edema centered at T8. No discrete soft tissue hematoma. Superimposed abnormal pleural spaces and lung parenchyma as seen by CT, including left lung fibrothorax with perhaps small superimposed acute pleural effusion. Stable visible upper abdominal viscera. Disc levels: Widespread thoracic spine ankylosis as demonstrated by CT. No significant degenerative changes to exacerbate the multifactorial spinal stenosis described in the cord section above. IMPRESSION: 1. T8 (not T7) Acute Vertebral Body Fracture is comminuted and oblique, and occurs in the setting of diffuse thoracic Spinal Ankylosis. However, there does not seem to be posterior element or posterior column disruption associated with this injury. Still, recommend Spine Surgery consultation if not already done in regard to immobilization/fixation. 2. Small volume dorsal epidural or subdural hematoma associated with #1 suspected from the T5 to the T7 level. And the blood is superimposed on widespread underlying thoracic epidural lipomatosis. Subsequent multilevel mild thoracic spinal stenosis, although no convincing cord compression or cord signal abnormality. 3. No other acute osseous abnormality in the thoracic spine. Chronic moderate to severe T11 compression fracture. Small expansile posterior right 8th rib myeloma lesion. 4. Abnormal lungs as seen on recent CT including a component of left fibrothorax. Electronically Signed   By: H  Hall M.D.   On: 11/08/2020 09:04     Scheduled Meds: . apixaban  5 mg Oral BID  . ascorbic acid  500 mg Oral Daily  . aspirin  81 mg Oral Daily  . azithromycin  500 mg Oral Daily  . calcium-vitamin D  1 tablet Oral BID  . ezetimibe  10 mg Oral Daily  . insulin aspart  0-5 Units Subcutaneous QHS  . insulin aspart  0-9 Units Subcutaneous TID WC  .  ipratropium-albuterol  3 mL Nebulization Q6H  . pantoprazole  40 mg Oral Daily  . propranolol  10 mg Oral BID   Continuous Infusions: . sodium chloride 500 mL (11/07/20 2345)  . cefTRIAXone (ROCEPHIN)  IV 2 g (11/08/20 2111)     LOS: 2 days      , MD [facility] If 7PM-7AM, please contact night-coverage 11/09/2020, 5:50 AM   

## 2020-11-08 NOTE — Progress Notes (Signed)
[facility] Cardiology    SUBJECTIVE: Patient states he feels much better with reduced congestion and cough still mild sputum production no fever chills or sweats no chest pain denies palpitations or tachycardia   Vitals:   11/07/20 2023 11/08/20 0146 11/08/20 0422 11/08/20 0739  BP:   106/65 105/67  Pulse:   79 (!) 57  Resp:   19 17  Temp:   97.7 F (36.5 C) 97.7 F (36.5 C)  TempSrc:   Oral Oral  SpO2: 95%  97% 96%  Weight:  83.8 kg    Height:         Intake/Output Summary (Last 24 hours) at 11/08/2020 1103 Last data filed at 11/08/2020 0940 Gross per 24 hour  Intake 2244.1 ml  Output -  Net 2244.1 ml      PHYSICAL EXAM  General: Well developed, well nourished, in no acute distress HEENT:  Normocephalic and atramatic Neck:  No JVD.  Lungs: Diffuse rhonchi bilaterally to auscultation and percussion. Heart: Irregular. Normal S1 and S2 without gallops or murmurs.  Abdomen: Bowel sounds are positive, abdomen soft and non-tender  Msk:  Back normal, normal gait. Normal strength and tone for age. Extremities: No clubbing, cyanosis or edema.   Neuro: Alert and oriented X 3. Psych:  Good affect, responds appropriately   LABS: Basic Metabolic Panel: Recent Labs    11/06/20 2237 11/08/20 0524  NA 140 132*  K 3.9 4.0  CL 105 102  CO2 24 21*  GLUCOSE 119* 170*  BUN 32* 43*  CREATININE 1.00 1.01  CALCIUM 9.0 8.2*   Liver Function Tests: No results for input(s): AST, ALT, ALKPHOS, BILITOT, PROT, ALBUMIN in the last 72 hours. No results for input(s): LIPASE, AMYLASE in the last 72 hours. CBC: Recent Labs    11/06/20 2237 11/08/20 0524  WBC 6.5 6.6  NEUTROABS 5.1  --   HGB 12.6* 10.9*  HCT 37.7* 31.1*  MCV 111.9* 109.1*  PLT 137* 123*   Cardiac Enzymes: No results for input(s): CKTOTAL, CKMB, CKMBINDEX, TROPONINI in the last 72 hours. BNP: Invalid input(s): POCBNP D-Dimer: No results for input(s): DDIMER in the last 72 hours. Hemoglobin A1C: No results for  input(s): HGBA1C in the last 72 hours. Fasting Lipid Panel: Recent Labs    11/08/20 0524  CHOL 97  HDL 45  LDLCALC 42  TRIG 52  CHOLHDL 2.2   Thyroid Function Tests: No results for input(s): TSH, T4TOTAL, T3FREE, THYROIDAB in the last 72 hours.  Invalid input(s): FREET3 Anemia Panel: No results for input(s): VITAMINB12, FOLATE, FERRITIN, TIBC, IRON, RETICCTPCT in the last 72 hours.  DG Chest 2 View  Result Date: 11/06/2020 CLINICAL DATA:  Cough, congestion, and shortness of breath for 4 days. EXAM: CHEST - 2 VIEW COMPARISON:  06/13/2020 FINDINGS: Postoperative changes in the mediastinum. Mild cardiac enlargement. Emphysematous changes in the lungs. Coarse infiltrates in both lungs, most likely representing chronic fibrosis. Similar appearance to previous study. No definite superimposed infiltration or edema. No pleural effusions. No pneumothorax. Mediastinal contours appear intact. Old bilateral rib fractures. Degenerative changes in the spine and shoulders. 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 No evidence of active pulmonary disease. Electronically Signed   By: Lucienne Capers M.D.   On: 11/06/2020 23:01   CT Angio Chest PE W and/or Wo Contrast  Result Date: 11/07/2020 CLINICAL DATA:  Pulmonary embolism.  Cough, chest congestion EXAM: CT ANGIOGRAPHY CHEST WITH CONTRAST TECHNIQUE: Multidetector CT imaging of the chest was performed using the standard  protocol during bolus administration of intravenous contrast. Multiplanar CT image reconstructions and MIPs were obtained to evaluate the vascular anatomy. CONTRAST:  21m OMNIPAQUE IOHEXOL 350 MG/ML SOLN COMPARISON:  06/14/2020 FINDINGS: Cardiovascular: There is excellent opacification of the pulmonary arterial vasculature. No intraluminal filling defect identified to suggest acute pulmonary embolism. The central pulmonary arteries are moderately enlarged in keeping with changes of pulmonary arterial  hypertension. Moderate multi-vessel coronary artery calcification. Global cardiac size is mildly enlarged. There is dilation of the right ventricle and right atrium in keeping with elevated right heart pressures. Coronary artery bypass grafting has been performed. No pericardial effusion. Moderate atherosclerotic calcification is seen within the thoracic aorta. No aortic aneurysm identified. Mediastinum/Nodes: Thyroid unremarkable. Several shotty mediastinal lymph nodes are identified within the prevascular, right periaortic, and subcarinal lymph node groups, likely reactive in nature. No frankly pathologic thoracic adenopathy. The esophagus is unremarkable. Lungs/Pleura: Mild centrilobular and paraseptal emphysema is again noted. There is stable pleural calcification and a chronic loculated left pleural effusion again identified with associated rounded atelectasis within the left lower lobe. Stable parenchymal scarring within the basilar lingula, likely posttraumatic or post inflammatory in nature. There has developed, however, new nodular infiltrate within the basilar right lower lobe and right middle lobe likely related to acute infection or aspiration. No pneumothorax. No pleural effusion on the right. Central airways are widely patent. Asymmetric bronchial wall thickening is seen within the right lower lobe in keeping with airway inflammation. Upper Abdomen: Nodular contour of the liver and hypertrophy of the left hepatic lobe is in keeping with changes of cirrhosis. Cholelithiasis noted. No acute abnormality. Musculoskeletal: There are innumerable lytic lesions seen throughout the visualized axial skeleton in keeping with changes related to the patient's known multiple myeloma. There is a a stable pathologic fracture of T10. There is a new pathologic transverse fracture through the T7 vertebral body which does not appear to involve the posterior elements. No associated listhesis. Mild associated paravertebral  soft tissue swelling present suggesting a in acute to subacute fracture. Review of the MIP images confirms the above findings. IMPRESSION: No pulmonary embolism. Moderate dilation of the central pulmonary arteries in keeping with changes of pulmonary arterial hypertension. Enlargement of the right heart in keeping with elevated right heart pressures. Interval development of nodular infiltrate within the right lung base, infection versus aspiration. Associated bronchial wall thickening within the right lower lobe in keeping with airway inflammation. Chronic loculated small left pleural effusion with associated pleural calcification and rounded atelectasis involving the left lower lobe and lingula, likely post inflammatory or posttraumatic in nature. Innumerable lytic lesions throughout the visualized axial spine in keeping with the patient's known multiple myeloma. New acute to subacute transverse fracture of T7 which does not appear to involve the posterior elements it is not associated with significant listhesis or loss of height. This does, however, involve both the anterior and posterior aspects of the vertebral body and surgical consultation is advised for further management. Stable chronic pathologic fracture of T10. Aortic Atherosclerosis (ICD10-I70.0) and Emphysema (ICD10-J43.9). Attempts are being made at this time to contact the managing clinician for direct communication. Electronically Signed   By: AFidela SalisburyMD   On: 11/07/2020 04:52   CT CERVICAL SPINE WO CONTRAST  Result Date: 11/07/2020 CLINICAL DATA:  History of multiple myeloma. Mid back pain. T7 fracture. EXAM: CT CERVICAL SPINE WITHOUT CONTRAST TECHNIQUE: Multidetector CT imaging of the cervical spine was performed without intravenous contrast. Multiplanar CT image reconstructions were also generated. COMPARISON:  None. FINDINGS: Alignment: Normal alignment. Skull base and vertebrae: Diffuse bone demineralization with multiple lucent  lesions throughout the cervical spine consistent known multiple myeloma. Bridging anterior and posterior osteophytes consistent with ankylosis. No acute vertebral compression deformities are identified Soft tissues and spinal canal: No paraspinal soft tissue mass or infiltration identified. No prevertebral soft tissue swelling. Carotid vascular calcifications. Disc levels: Diffuse disc space narrowing with endplate hypertrophic changes consistent with diffuse degenerative change. Upper chest: Visualized portions of the upper lungs demonstrate scarring in the lung apices with left apical pleural thickening and calcification, likely postinflammatory. Other: None. IMPRESSION: 1. Diffuse bone demineralization with multiple lucent lesions throughout the cervical spine consistent with known multiple myeloma. 2. Bridging anterior and posterior osteophytes consistent with ankylosis. 3. No acute displaced fractures identified. 4. Carotid vascular calcifications. 5. Scarring in the lung apices with left apical pleural thickening and calcification, likely postinflammatory. Electronically Signed   By: Lucienne Capers M.D.   On: 11/07/2020 15:22   CT LUMBAR SPINE WO CONTRAST  Result Date: 11/07/2020 CLINICAL DATA:  Mid back pain.  T7 fracture. EXAM: CT LUMBAR SPINE WITHOUT CONTRAST TECHNIQUE: Multidetector CT imaging of the lumbar spine was performed without intravenous contrast administration. Multiplanar CT image reconstructions were also generated. COMPARISON:  CT chest 11/07/2020.  MRI lumbar spine 04/06/2019 FINDINGS: Segmentation: 5 lumbar type vertebral bodies. Alignment: Normal alignment.  No anterior subluxations. Vertebrae: Diffuse heterogeneous bone demineralization with multiple lucent lesions demonstrated throughout all lumbar segments and the sacrum and visualized pelvis. Changes are consistent with history of multiple myeloma. No acute vertebral compression deformities. Paraspinal and other soft tissues: No  paraspinal soft tissue mass or infiltration demonstrated. Fatty atrophy of the posterior paraspinal muscles. Calcification aorta. Suggestion of gallstones. Disc levels: Degenerative changes with narrowed interspaces and endplate hypertrophic changes throughout. Degenerative changes most prominent at T12-L1 level. IMPRESSION: 1. No acute displaced fractures identified in the lumbar spine. Multiple lucent lesions demonstrated throughout the visualized skeletal components consistent with history of multiple myeloma. 2. Degenerative changes throughout the lumbar spine. 3. Suggestion of gallstones. Aortic Atherosclerosis (ICD10-I70.0). Electronically Signed   By: Lucienne Capers M.D.   On: 11/07/2020 15:19   MR THORACIC SPINE WO CONTRAST  Result Date: 11/08/2020 CLINICAL DATA:  75 year old male with multiple myeloma undergoing treatment. 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 Cough. T7 vertebral fracture on recent CTA. EXAM: MRI THORACIC SPINE WITHOUT CONTRAST TECHNIQUE: Multiplanar, multisequence MR imaging of the thoracic spine was performed. No intravenous contrast was administered. COMPARISON:  CTA chest 11/07/2020.  Lumbar spine CT 11/07/2020. FINDINGS: Limited cervical spine imaging: Straightening of cervical lordosis, otherwise grossly normal. Thoracic spine segmentation: Normal, although the abnormal thoracic spine on recent chest CTA was miss numbered, the abnormal vertebra is T8. Alignment:  Exaggerated thoracic kyphosis.  No spondylolisthesis. Vertebrae: Severe marrow edema throughout the fractured T8 vertebral body, and patchy severe edema also in the adjacent T9 anterior superior endplate. Trace marrow edema in the inferior posterior T7 endplate. And the comparison chest CTA demonstrates widespread thoracic spine ankylosis with the oblique fracture through T8 superimposed on underlying interbody ankylosis. However, there does not appear to be associated posterior element or posterior  column disruption on either these images or the CT yesterday. Chronic moderate to severe T11 compression fracture superimposed. At other thoracic and visible upper lumbar levels marrow signal is within normal limits. However, there are occasional expansile posterior rib lesions (right posterior 8th rib on series 23, image 35). Cord: No spinal cord signal abnormality is identified.  But note extensive thoracic epidural lipomatosis (maximal T5 through T7 series 21, image 16) effacing CSF from the thecal sac. Furthermore, heterogeneous increased STIR signal in the dorsal spinal canal at T5 through T7 is suspicious for epidural or subdural blood products. See series 22, image 16 and series 23, image 18. This contributes to the mild spinal stenosis at those levels. Conus medullaris appears to remain normal at T12-L1. Paraspinal and other soft tissues: Confluent right lateral paraspinal soft tissue edema centered at T8. No discrete soft tissue hematoma. Superimposed abnormal pleural spaces and lung parenchyma as seen by CT, including left lung fibrothorax with perhaps small superimposed acute pleural effusion. Stable visible upper abdominal viscera. Disc levels: Widespread thoracic spine ankylosis as demonstrated by CT. No significant degenerative changes to exacerbate the multifactorial spinal stenosis described in the cord section above. IMPRESSION: 1. T8 (not T7) Acute Vertebral Body Fracture is comminuted and oblique, and occurs in the setting of diffuse thoracic Spinal Ankylosis. However, there does not seem to be posterior element or posterior column disruption associated with this injury. Still, recommend Spine Surgery consultation if not already done in regard to immobilization/fixation. 2. Small volume dorsal epidural or subdural hematoma associated with #1 suspected from the T5 to the T7 level. And the blood is superimposed on widespread underlying thoracic epidural lipomatosis. Subsequent multilevel mild  thoracic spinal stenosis, although no convincing cord compression or cord signal abnormality. 3. No other acute osseous abnormality in the thoracic spine. Chronic moderate to severe T11 compression fracture. Small expansile posterior right 8th rib myeloma lesion. 4. Abnormal lungs as seen on recent CT including a component of left fibrothorax. Electronically Signed   By: Genevie Ann M.D.   On: 11/08/2020 09:04     Echo pending  TELEMETRY: Atrial fibrillation rate control 72 suggestive changes:  ASSESSMENT AND PLAN:  Principal Problem:   HCAP (healthcare-associated pneumonia) Active Problems:   Multiple myeloma (HCC)   Benign essential HTN   Type 2 diabetes mellitus without complication, without long-term current use of insulin (HCC)   CAD (coronary artery disease) of artery bypass graft   Squamous cell skin cancer   Severe sepsis (HCC)   HLD (hyperlipidemia)   GERD (gastroesophageal reflux disease)   Depression with anxiety   Chronic diastolic CHF (congestive heart failure) (HCC)   Elevated troponin   Atrial fibrillation with RVR ([place])   T7 vertebral fracture (HCC)   Aspiration pneumonia (HCC)    Plan Bronchitis possible healthcare acquired pneumonia continue antibiotic therapy and inhalers Multiple myeloma multiple vertebral fractures work-up as per neurosurgery history continue current management therapy Continue hypertension management control Maintain diabetes management follow-up blood sugars insulin as necessary Reflux medications to help GERD Borderline troponins suggestive of demand ischemia New-onset atrial fibrillation recommend short-term anticoagulation probably related to lung disease Squamous cell skin cancer continue XRT as per Dr. Donella Stade Continue hyperlipidemia therapy    Yolonda Kida, MD 11/08/2020 11:03 AM

## 2020-11-09 DIAGNOSIS — J189 Pneumonia, unspecified organism: Secondary | ICD-10-CM | POA: Diagnosis not present

## 2020-11-09 DIAGNOSIS — I4891 Unspecified atrial fibrillation: Secondary | ICD-10-CM | POA: Diagnosis not present

## 2020-11-09 DIAGNOSIS — F418 Other specified anxiety disorders: Secondary | ICD-10-CM | POA: Diagnosis not present

## 2020-11-09 DIAGNOSIS — I10 Essential (primary) hypertension: Secondary | ICD-10-CM | POA: Diagnosis not present

## 2020-11-09 LAB — BASIC METABOLIC PANEL
Anion gap: 8 (ref 5–15)
BUN: 40 mg/dL — ABNORMAL HIGH (ref 8–23)
CO2: 22 mmol/L (ref 22–32)
Calcium: 8.5 mg/dL — ABNORMAL LOW (ref 8.9–10.3)
Chloride: 102 mmol/L (ref 98–111)
Creatinine, Ser: 0.9 mg/dL (ref 0.61–1.24)
GFR, Estimated: >60 mL/min (ref >60)
Glucose, Bld: 124 mg/dL — ABNORMAL HIGH (ref 70–99)
Potassium: 4.4 mmol/L (ref 3.5–5.1)
Sodium: 132 mmol/L — ABNORMAL LOW (ref 135–145)

## 2020-11-09 LAB — CBC
HCT: 31.9 % — ABNORMAL LOW (ref 39.0–52.0)
Hemoglobin: 10.8 g/dL — ABNORMAL LOW (ref 13.0–17.0)
MCH: 37.4 pg — ABNORMAL HIGH (ref 26.0–34.0)
MCHC: 33.9 g/dL (ref 30.0–36.0)
MCV: 110.4 fL — ABNORMAL HIGH (ref 80.0–100.0)
Platelets: 134 10*3/uL — ABNORMAL LOW (ref 150–400)
RBC: 2.89 MIL/uL — ABNORMAL LOW (ref 4.22–5.81)
RDW: 13.1 % (ref 11.5–15.5)
WBC: 8.1 10*3/uL (ref 4.0–10.5)
nRBC: 0 % (ref 0.0–0.2)

## 2020-11-09 LAB — MAGNESIUM: Magnesium: 2.1 mg/dL (ref 1.7–2.4)

## 2020-11-09 LAB — GLUCOSE, CAPILLARY
Glucose-Capillary: 118 mg/dL — ABNORMAL HIGH (ref 70–99)
Glucose-Capillary: 118 mg/dL — ABNORMAL HIGH (ref 70–99)
Glucose-Capillary: 130 mg/dL — ABNORMAL HIGH (ref 70–99)
Glucose-Capillary: 122 mg/dL — ABNORMAL HIGH (ref 70–99)

## 2020-11-09 MED ORDER — DM-GUAIFENESIN ER 30-600 MG PO TB12
1.0000 | ORAL_TABLET | Freq: Two times a day (BID) | ORAL | Status: DC
  Administered 2020-11-09 – 2020-11-13 (×9): 1 via ORAL
  Filled 2020-11-09 (×8): qty 1

## 2020-11-09 MED ORDER — ALPRAZOLAM 0.25 MG PO TABS
0.2500 mg | ORAL_TABLET | Freq: Two times a day (BID) | ORAL | Status: DC | PRN
  Administered 2020-11-09 – 2020-11-11 (×4): 0.25 mg via ORAL
  Filled 2020-11-09 (×4): qty 1

## 2020-11-09 MED ORDER — GUAIFENESIN-DM 100-10 MG/5ML PO SYRP
5.0000 mL | ORAL_SOLUTION | ORAL | Status: DC | PRN
  Administered 2020-11-09 – 2020-11-11 (×2): 5 mL via ORAL
  Filled 2020-11-09 (×2): qty 5

## 2020-11-09 NOTE — Progress Notes (Signed)
[facility] Cardiology    SUBJECTIVE: Patient feeling better still has some congestion and cough denies any chest pain no fever chills or sweats no nausea vomiting or diarrhea.  Patient states to be recovering slowly still has some dyspnea with hypoxemia requiring supplemental oxygen as necessary   Vitals:   11/09/20 0337 11/09/20 0747 11/09/20 0754 11/09/20 1217  BP: 120/71 123/68  (!) 117/44  Pulse: (!) 59 (!) 59  (!) 57  Resp: '19 19  19  ' Temp: 97.8 F (36.6 C) 97.7 F (36.5 C)  98.2 F (36.8 C)  TempSrc: Oral Oral  Oral  SpO2: 92% 95% 91% 96%  Weight: 85.9 kg     Height:         Intake/Output Summary (Last 24 hours) at 11/09/2020 1238 Last data filed at 11/09/2020 0801 Gross per 24 hour  Intake 1563.2 ml  Output 500 ml  Net 1063.2 ml      PHYSICAL EXAM  General: Well developed, well nourished, in no acute distress HEENT:  Normocephalic and atramatic scalp ulcer from squamous cell cancer Neck:  No JVD.  Lungs: Clear bilaterally to auscultation and percussion. Heart: HRRR . Normal S1 and S2 without gallops or murmurs.  Abdomen: Bowel sounds are positive, abdomen soft and non-tender  Msk:  Back normal, normal gait. Normal strength and tone for age. Extremities: No clubbing, cyanosis or edema.   Neuro: Alert and oriented X 3. Psych:  Good affect, responds appropriately   LABS: Basic Metabolic Panel: Recent Labs    11/08/20 0524 11/09/20 0602  NA 132* 132*  K 4.0 4.4  CL 102 102  CO2 21* 22  GLUCOSE 170* 124*  BUN 43* 40*  CREATININE 1.01 0.90  CALCIUM 8.2* 8.5*  MG  --  2.1   Liver Function Tests: No results for input(s): AST, ALT, ALKPHOS, BILITOT, PROT, ALBUMIN in the last 72 hours. No results for input(s): LIPASE, AMYLASE in the last 72 hours. CBC: Recent Labs    11/06/20 2237 11/08/20 0524 11/09/20 0602  WBC 6.5 6.6 8.1  NEUTROABS 5.1  --   --   HGB 12.6* 10.9* 10.8*  HCT 37.7* 31.1* 31.9*  MCV 111.9* 109.1* 110.4*  PLT 137* 123* 134*   Cardiac  Enzymes: No results for input(s): CKTOTAL, CKMB, CKMBINDEX, TROPONINI in the last 72 hours. BNP: Invalid input(s): POCBNP D-Dimer: No results for input(s): DDIMER in the last 72 hours. Hemoglobin A1C: Recent Labs    11/08/20 0524  HGBA1C 5.3   Fasting Lipid Panel: Recent Labs    11/08/20 0524  CHOL 97  HDL 45  LDLCALC 42  TRIG 52  CHOLHDL 2.2   Thyroid Function Tests: No results for input(s): TSH, T4TOTAL, T3FREE, THYROIDAB in the last 72 hours.  Invalid input(s): FREET3 Anemia Panel: No results for input(s): VITAMINB12, FOLATE, FERRITIN, TIBC, IRON, RETICCTPCT in the last 72 hours.  CT CERVICAL SPINE WO CONTRAST  Result Date: 11/07/2020 CLINICAL DATA:  History of multiple myeloma. Mid back pain. T7 fracture. EXAM: CT CERVICAL SPINE WITHOUT CONTRAST TECHNIQUE: Multidetector CT imaging of the cervical spine was performed without intravenous contrast. Multiplanar CT image reconstructions were also generated. COMPARISON:  None. FINDINGS: Alignment: Normal alignment. Skull base and vertebrae: Diffuse bone demineralization with multiple lucent lesions throughout the cervical spine consistent known multiple myeloma. Bridging anterior and posterior osteophytes consistent with ankylosis. No acute vertebral compression deformities are identified Soft tissues and spinal canal: No paraspinal soft tissue mass or infiltration identified. No prevertebral soft tissue swelling. Carotid vascular  calcifications. Disc levels: Diffuse disc space narrowing with endplate hypertrophic changes consistent with diffuse degenerative change. Upper chest: Visualized portions of the upper lungs demonstrate scarring in the lung apices with left apical pleural thickening and calcification, likely postinflammatory. Other: None. IMPRESSION: 1. Diffuse bone demineralization with multiple lucent lesions throughout the cervical spine consistent with known multiple myeloma. 2. Bridging anterior and posterior osteophytes  consistent with ankylosis. 3. No acute displaced fractures identified. 4. Carotid vascular calcifications. 5. Scarring in the lung apices with left apical pleural thickening and calcification, likely postinflammatory. Electronically Signed   By: Lucienne Capers M.D.   On: 11/07/2020 15:22   CT LUMBAR SPINE WO CONTRAST  Result Date: 11/07/2020 CLINICAL DATA:  Mid back pain.  T7 fracture. EXAM: CT LUMBAR SPINE WITHOUT CONTRAST TECHNIQUE: Multidetector CT imaging of the lumbar spine was performed without intravenous contrast administration. Multiplanar CT image reconstructions were also generated. COMPARISON:  CT chest 11/07/2020.  MRI lumbar spine 04/06/2019 FINDINGS: Segmentation: 5 lumbar type vertebral bodies. Alignment: Normal alignment.  No anterior subluxations. Vertebrae: Diffuse heterogeneous bone demineralization with multiple lucent lesions demonstrated throughout all lumbar segments and the sacrum and visualized pelvis. Changes are consistent with history of multiple myeloma. No acute vertebral compression deformities. Paraspinal and other soft tissues: No paraspinal soft tissue mass or infiltration demonstrated. Fatty atrophy of the posterior paraspinal muscles. Calcification aorta. Suggestion of gallstones. Disc levels: Degenerative changes with narrowed interspaces and endplate hypertrophic changes throughout. Degenerative changes most prominent at T12-L1 level. IMPRESSION: 1. No acute displaced fractures identified in the lumbar spine. Multiple lucent lesions demonstrated throughout the visualized skeletal components consistent with history of multiple myeloma. 2. Degenerative changes throughout the lumbar spine. 3. Suggestion of gallstones. Aortic Atherosclerosis (ICD10-I70.0). Electronically Signed   By: Lucienne Capers M.D.   On: 11/07/2020 15:19   MR THORACIC SPINE WO CONTRAST  Result Date: 11/08/2020 CLINICAL DATA:  75 year old male with multiple myeloma undergoing treatment. Large scalp  squamous cell carcinoma undergoing radiation. Cough. T7 vertebral fracture on recent CTA. EXAM: MRI THORACIC SPINE WITHOUT CONTRAST TECHNIQUE: Multiplanar, multisequence MR imaging of the thoracic spine was performed. No intravenous contrast was administered. COMPARISON:  CTA chest 11/07/2020.  Lumbar spine CT 11/07/2020. FINDINGS: Limited cervical spine imaging: Straightening of cervical lordosis, otherwise grossly normal. Thoracic spine segmentation: Normal, although the abnormal thoracic spine on recent chest CTA was miss numbered, the abnormal vertebra is T8. Alignment:  Exaggerated thoracic kyphosis.  No spondylolisthesis. Vertebrae: Severe marrow edema throughout the fractured T8 vertebral body, and patchy severe edema also in the adjacent T9 anterior superior endplate. Trace marrow edema in the inferior posterior T7 endplate. And the comparison chest CTA demonstrates widespread thoracic spine ankylosis with the oblique fracture through T8 superimposed on underlying interbody ankylosis. However, there does not appear to be associated posterior element or posterior column disruption on either these images or the CT yesterday. Chronic moderate to severe T11 compression fracture superimposed. At other thoracic and visible upper lumbar levels marrow signal is within normal limits. However, there are occasional expansile posterior rib lesions (right posterior 8th rib on series 23, image 35). Cord: No spinal cord signal abnormality is identified. But note extensive thoracic epidural lipomatosis (maximal T5 through T7 series 21, image 16) effacing CSF from the thecal sac. Furthermore, heterogeneous increased STIR signal in the dorsal spinal canal at T5 through T7 is suspicious for epidural or subdural blood products. See series 22, image 16 and series 23, image 18. This contributes to the mild spinal stenosis at  those levels. Conus medullaris appears to remain normal at T12-L1. Paraspinal and other soft tissues:  Confluent right lateral paraspinal soft tissue edema centered at T8. No discrete soft tissue hematoma. Superimposed abnormal pleural spaces and lung parenchyma as seen by CT, including left lung fibrothorax with perhaps small superimposed acute pleural effusion. Stable visible upper abdominal viscera. Disc levels: Widespread thoracic spine ankylosis as demonstrated by CT. No significant degenerative changes to exacerbate the multifactorial spinal stenosis described in the cord section above. IMPRESSION: 1. T8 (not T7) Acute Vertebral Body Fracture is comminuted and oblique, and occurs in the setting of diffuse thoracic Spinal Ankylosis. However, there does not seem to be posterior element or posterior column disruption associated with this injury. Still, recommend Spine Surgery consultation if not already done in regard to immobilization/fixation. 2. Small volume dorsal epidural or subdural hematoma associated with #1 suspected from the T5 to the T7 level. And the blood is superimposed on widespread underlying thoracic epidural lipomatosis. Subsequent multilevel mild thoracic spinal stenosis, although no convincing cord compression or cord signal abnormality. 3. No other acute osseous abnormality in the thoracic spine. Chronic moderate to severe T11 compression fracture. Small expansile posterior right 8th rib myeloma lesion. 4. Abnormal lungs as seen on recent CT including a component of left fibrothorax. Electronically Signed   By: Genevie Ann M.D.   On: 11/08/2020 09:04     Echo pending  TELEMETRY: Normal sinus rhythm rate of 60  ASSESSMENT AND PLAN:  Principal Problem:   HCAP (healthcare-associated pneumonia) Active Problems:   Multiple myeloma (HCC)   Benign essential HTN   Type 2 diabetes mellitus without complication, without long-term current use of insulin (HCC)   CAD (coronary artery disease) of artery bypass graft   Squamous cell skin cancer   Severe sepsis (HCC)   HLD (hyperlipidemia)    GERD (gastroesophageal reflux disease)   Depression with anxiety   Chronic diastolic CHF (congestive heart failure) (HCC)   Elevated troponin   Atrial fibrillation with RVR ([place])   T7 vertebral fracture (HCC)   Aspiration pneumonia (HCC)    Plan Continue broad-spectrum antibiotic therapy Continue therapy for acute bronchitis inhalers steroids antibiotic therapy Continue therapy for multiple myeloma which includes multiple vertebral fractures Maintain aggressive therapy for hypertension management and control Diabetes management for hyperglycemia Continue reflux therapy for GERD Atrial fibrillation currently on anticoagulation short-term probably related to lung inflammation converted to sinus rhythm Squamous cell cancer  of the scalp head she is is getting XRT Maintain conservative cardiac input at this point   Yolonda Kida, MD 11/09/2020 12:38 PM

## 2020-11-09 NOTE — TOC Progression Note (Signed)
Transition of Care (TOC) - Progression Note    Patient Details  Name: Luis [name]. MRN: [id number] Date of Birth: 1946/08/13  [facility]) CM/SW Contact  Izola Price, RN Phone Number: 11/09/2020, 2:07 PM  Clinical Narrative:    11/09/20   Independent PTA.  Donella Stade consult on 12/31 for discharge needs; [place], DME.  . Covid 6 months ago.  . Widowed 1 year ago.  Marland Kitchen No family support in area.  . Htx Multiple myeloma w ongoing chemo and radiation therapy.   ED for shortness of breath with new Afib/RVR. Simmie Davies RN CM         Expected Discharge Plan and Services                                                 Social Determinants of Health (SDOH) Interventions    Readmission Risk Interventions No flowsheet data found.

## 2020-11-09 NOTE — Progress Notes (Signed)
PROGRESS NOTE    Luis [name].  ZDG:[id number] DOB: 08-31-1946 DOA: 11/07/2020 PCP: Tracie Harrier, MD    Assessment & Plan:   Principal Problem:   HCAP (healthcare-associated pneumonia) Active Problems:   Multiple myeloma ([place])   Benign essential HTN   Type 2 diabetes mellitus without complication, without long-term current use of insulin (HCC)   CAD (coronary artery disease) of artery bypass graft   Squamous cell skin cancer   Severe sepsis (HCC)   HLD (hyperlipidemia)   GERD (gastroesophageal reflux disease)   Depression with anxiety   Chronic diastolic CHF (congestive heart failure) (HCC)   Elevated troponin   Atrial fibrillation with RVR ([place])   T7 vertebral fracture (HCC)   Aspiration pneumonia (HCC)    Luis Minnie. is a 75 y.o. male with medical history significant of HTN, HLD, DM, MM, scalp skin CA on XRT, CAD, CABG, dCHF, presents with shortness of breath.   Acute respiratory failure with hypoxia    --CTA negative for PE, but showed right basilar opacity.   --Patient has oxygen desaturated to 88% on room air, which improved to 94% on 3 L oxygen in ED. Plan: --treat underlying cause, PNA --Continue supplemental O2 to keep sats >=92%, wean as tolerated  severe sepsis due to possible CAP vs. aspiration pneumonia:  --presented with tachycardia, tachypnea, elevated lactic acid 2.7, 2.9.  source PNA. - started on IV Vancomycin and cefepime, flagyl on admission --procal 0.21 on presentation >> 0.32  Plan: --Continue Rocephin and Zithromax --Mucinex twice daily with as needed Robitussin-DM --scheduled DuoNeb --SLP today, found no issue   New onset A fib with RVR:  Acquired thrombophilia  CHA2DS2-VASc Score is 6, needs anticoagulation. IV heparin started in ED. Dr. Clayborn Bigness of card is consulted. HR 126 -->90s now. -pt received 100 mg of Cardizem in the ED, since rate controlled --cardiology recommended short-term anticoagulation. Plan: -Continue home  propranolol --switch to Eliquis for ppx, will continue for at least a month  Elevated troponin 2/2 demand ischemia hx of CAD S/p of CABG.  trop 167 -->148.    Had some chest tightness which he attributes to coughing.   Elevated troponin due to due to demand ischemia in setting of pneumonia Plan: --cont ASA and Zetia (patient is allergic to several statins)  T8 vertebral fracture [facility]):  consulted neurosurgeon, Dr. Johnney Killian saw pt.  Plan: -TLSO brace when out of bed --follow up with Neurosurgery  Multiple myeloma [facility]): Patient was on Revlimid, taken off few months ago by oncologist -f/u with oncology  Benign essential HTN -Continue propranolol.  Hold losartan due to soft BP  Type 2 diabetes mellitus without complication, without long-term current use of insulin ([place]):  Recent A1c 5.2, well controlled.    Hold Metformin. -Sliding scale insulin  Squamous cell skin cancer: On scalp  -Patient is treated with radiation therapy by dermatologist, last treatment was last Tuesday  HLD (hyperlipidemia) --cont Zetia (patient is allergic to several statins)  GERD (gastroesophageal reflux disease) -Protonix  Depression with anxiety: Patient used to be on Paxil, no longer taking --cont Xanax PRN -increase to twice daily as needed as patient with increased anxiety today 1/2  Chronic diastolic CHF (congestive heart failure) ([place]):  2D echo on 03/17/2018 showed EF of 50%.  Patient has trace leg edema, no pulmonary edema chest x-ray. BNP is elevated 815, however patient appears clinically euvolemic at this time.  Monitor volume status closely. Plan: --d/c MIVF   DVT prophylaxis: VZ:DGLOVFI Code Status:  Full code  Family Communication:  Status is: inpatient Dispo:   The patient is from: home Anticipated d/c is to: home Anticipated d/c date is: 2-3 days Patient currently is not medically stable to d/c due to: hypoxia on IV abx for CAP   Subjective and Interval History:  Pt  reported a lot of coughing, with cumulative large amount of sputum production.  Admitted to some back pain.   Objective: Vitals:   11/09/20 0337 11/09/20 0747 11/09/20 0754 11/09/20 1217  BP: 120/71 123/68  (!) 117/44  Pulse: (!) 59 (!) 59  (!) 57  Resp: '19 19  19  ' Temp: 97.8 F (36.6 C) 97.7 F (36.5 C)  98.2 F (36.8 C)  TempSrc: Oral Oral  Oral  SpO2: 92% 95% 91% 96%  Weight: 85.9 kg     Height:        Intake/Output Summary (Last 24 hours) at 11/09/2020 1442 Last data filed at 11/09/2020 1335 Gross per 24 hour  Intake 1563.2 ml  Output 500 ml  Net 1063.2 ml   Filed Weights   11/07/20 0916 11/08/20 0146 11/09/20 0337  Weight: 85.8 kg 83.8 kg 85.9 kg    Examination:   Constitutional: Awake and alert, no acute distress HEENT: Moist mucous membranes, hearing grossly normal CV: RRR, no peripheral edema RESP: frequent cough, on 2L Extremities: Moves all, no cyanosis or edema  SKIN: Ulcerated lesion on top of the scalp, otherwise dry and intact Neuro: Grossly nonfocal exam, normal speech Psych: Normal mood and affect.  Appropriate judgement and reason   Data Reviewed: I have personally reviewed following labs and imaging studies  CBC: Recent Labs  Lab 11/06/20 2237 11/08/20 0524 11/09/20 0602  WBC 6.5 6.6 8.1  NEUTROABS 5.1  --   --   HGB 12.6* 10.9* 10.8*  HCT 37.7* 31.1* 31.9*  MCV 111.9* 109.1* 110.4*  PLT 137* 123* 924*   Basic Metabolic Panel: Recent Labs  Lab 11/06/20 2237 11/08/20 0524 11/09/20 0602  NA 140 132* 132*  K 3.9 4.0 4.4  CL 105 102 102  CO2 24 21* 22  GLUCOSE 119* 170* 124*  BUN 32* 43* 40*  CREATININE 1.00 1.01 0.90  CALCIUM 9.0 8.2* 8.5*  MG  --   --  2.1   GFR: Estimated Creatinine Clearance: 71.7 mL/min (by C-G formula based on SCr of 0.9 mg/dL). Liver Function Tests: No results for input(s): AST, ALT, ALKPHOS, BILITOT, PROT, ALBUMIN in the last 168 hours. No results for input(s): LIPASE, AMYLASE in the last 168 hours. No  results for input(s): AMMONIA in the last 168 hours. Coagulation Profile: Recent Labs  Lab 11/07/20 0450  INR 1.4*   Cardiac Enzymes: No results for input(s): CKTOTAL, CKMB, CKMBINDEX, TROPONINI in the last 168 hours. BNP (last 3 results) No results for input(s): PROBNP in the last 8760 hours. HbA1C: Recent Labs    11/08/20 0524  HGBA1C 5.3   CBG: Recent Labs  Lab 11/08/20 1127 11/08/20 1706 11/08/20 2105 11/09/20 0748 11/09/20 1201  GLUCAP 198* 118* 165* 118* 118*   Lipid Profile: Recent Labs    11/08/20 0524  CHOL 97  HDL 45  LDLCALC 42  TRIG 52  CHOLHDL 2.2   Thyroid Function Tests: No results for input(s): TSH, T4TOTAL, FREET4, T3FREE, THYROIDAB in the last 72 hours. Anemia Panel: No results for input(s): VITAMINB12, FOLATE, FERRITIN, TIBC, IRON, RETICCTPCT in the last 72 hours. Sepsis Labs: Recent Labs  Lab 11/07/20 0235 11/07/20 0549 11/07/20 0729 11/07/20 1020 11/07/20  1248 11/08/20 0524  PROCALCITON 0.21  --   --   --   --  0.32  LATICACIDVEN  --  2.7* 2.9* 3.7* 3.6*  --     Recent Results (from the past 240 hour(s))  Resp Panel by RT-PCR (Flu A&B, Covid) Nasopharyngeal Swab     Status: None   Collection Time: 11/07/20  2:35 AM   Specimen: Nasopharyngeal Swab; Nasopharyngeal(NP) swabs in vial transport medium  Result Value Ref Range Status   SARS Coronavirus 2 by RT PCR NEGATIVE NEGATIVE Final    Comment: (NOTE) SARS-CoV-2 target nucleic acids are NOT DETECTED.  The SARS-CoV-2 RNA is generally detectable in upper respiratory specimens during the acute phase of infection. The lowest concentration of SARS-CoV-2 viral copies this assay can detect is 138 copies/mL. A negative result does not preclude SARS-Cov-2 infection and should not be used as the sole basis for treatment or other patient management decisions. A negative result may occur with  improper specimen collection/handling, submission of specimen other than nasopharyngeal swab,  presence of viral mutation(s) within the areas targeted by this assay, and inadequate number of viral copies(<138 copies/mL). A negative result must be combined with clinical observations, patient history, and epidemiological information. The expected result is Negative.  Fact Sheet for Patients:  EntrepreneurPulse.com.au  Fact Sheet for Healthcare Providers:  IncredibleEmployment.be  This test is no t yet approved or cleared by the [place] FDA and  has been authorized for detection and/or diagnosis of SARS-CoV-2 by FDA under an Emergency Use Authorization (EUA). This EUA will remain  in effect (meaning this test can be used) for the duration of the COVID-19 declaration under Section 564(b)(1) of the Act, 21 U.S.C.section 360bbb-3(b)(1), unless the authorization is terminated  or revoked sooner.       Influenza A by PCR NEGATIVE NEGATIVE Final   Influenza B by PCR NEGATIVE NEGATIVE Final    Comment: (NOTE) The Xpert Xpress SARS-CoV-2/FLU/RSV plus assay is intended as an aid in the diagnosis of influenza from Nasopharyngeal swab specimens and should not be used as a sole basis for treatment. Nasal washings and aspirates are unacceptable for Xpert Xpress SARS-CoV-2/FLU/RSV testing.  Fact Sheet for Patients: EntrepreneurPulse.com.au  Fact Sheet for Healthcare Providers: IncredibleEmployment.be  This test is not yet approved or cleared by the [place] FDA and has been authorized for detection and/or diagnosis of SARS-CoV-2 by FDA under an Emergency Use Authorization (EUA). This EUA will remain in effect (meaning this test can be used) for the duration of the COVID-19 declaration under Section 564(b)(1) of the Act, 21 U.S.C. section 360bbb-3(b)(1), unless the authorization is terminated or revoked.  Performed at [facility], [address] [place], [place] [address]   Culture, blood  (single)     Status: None (Preliminary result)   Collection Time: 11/07/20  5:50 AM   Specimen: BLOOD  Result Value Ref Range Status   Specimen Description BLOOD LFA  Final   Special Requests   Final    BOTTLES DRAWN AEROBIC AND ANAEROBIC Blood Culture adequate volume   Culture   Final    NO GROWTH 2 DAYS Performed at [facility], [address], [place], [place] [address]    Report Status PENDING  Incomplete  MRSA PCR Screening     Status: None   Collection Time: 11/07/20 12:39 PM   Specimen: Nasal Mucosa; Nasopharyngeal  Result Value Ref Range Status   MRSA by PCR NEGATIVE NEGATIVE Final    Comment:  The GeneXpert MRSA Assay (FDA approved for NASAL specimens only), is one component of a comprehensive MRSA colonization surveillance program. It is not intended to diagnose MRSA infection nor to guide or monitor treatment for MRSA infections. Performed at [facility], [address], [place], [place] [address]       Radiology Studies: CT CERVICAL SPINE WO CONTRAST  Result Date: 11/07/2020 CLINICAL DATA:  History of multiple myeloma. Mid back pain. T7 fracture. EXAM: CT CERVICAL SPINE WITHOUT CONTRAST TECHNIQUE: Multidetector CT imaging of the cervical spine was performed without intravenous contrast. Multiplanar CT image reconstructions were also generated. COMPARISON:  None. FINDINGS: Alignment: Normal alignment. Skull base and vertebrae: Diffuse bone demineralization with multiple lucent lesions throughout the cervical spine consistent known multiple myeloma. Bridging anterior and posterior osteophytes consistent with ankylosis. No acute vertebral compression deformities are identified Soft tissues and spinal canal: No paraspinal soft tissue mass or infiltration identified. No prevertebral soft tissue swelling. Carotid vascular calcifications. Disc levels: Diffuse disc space narrowing with endplate hypertrophic changes consistent with diffuse  degenerative change. Upper chest: Visualized portions of the upper lungs demonstrate scarring in the lung apices with left apical pleural thickening and calcification, likely postinflammatory. Other: None. IMPRESSION: 1. Diffuse bone demineralization with multiple lucent lesions throughout the cervical spine consistent with known multiple myeloma. 2. Bridging anterior and posterior osteophytes consistent with ankylosis. 3. No acute displaced fractures identified. 4. Carotid vascular calcifications. 5. Scarring in the lung apices with left apical pleural thickening and calcification, likely postinflammatory. Electronically Signed   By: Lucienne Capers M.D.   On: 11/07/2020 15:22   CT LUMBAR SPINE WO CONTRAST  Result Date: 11/07/2020 CLINICAL DATA:  Mid back pain.  T7 fracture. EXAM: CT LUMBAR SPINE WITHOUT CONTRAST TECHNIQUE: Multidetector CT imaging of the lumbar spine was performed without intravenous contrast administration. Multiplanar CT image reconstructions were also generated. COMPARISON:  CT chest 11/07/2020.  MRI lumbar spine 04/06/2019 FINDINGS: Segmentation: 5 lumbar type vertebral bodies. Alignment: Normal alignment.  No anterior subluxations. Vertebrae: Diffuse heterogeneous bone demineralization with multiple lucent lesions demonstrated throughout all lumbar segments and the sacrum and visualized pelvis. Changes are consistent with history of multiple myeloma. No acute vertebral compression deformities. Paraspinal and other soft tissues: No paraspinal soft tissue mass or infiltration demonstrated. Fatty atrophy of the posterior paraspinal muscles. Calcification aorta. Suggestion of gallstones. Disc levels: Degenerative changes with narrowed interspaces and endplate hypertrophic changes throughout. Degenerative changes most prominent at T12-L1 level. IMPRESSION: 1. No acute displaced fractures identified in the lumbar spine. Multiple lucent lesions demonstrated throughout the visualized skeletal  components consistent with history of multiple myeloma. 2. Degenerative changes throughout the lumbar spine. 3. Suggestion of gallstones. Aortic Atherosclerosis (ICD10-I70.0). Electronically Signed   By: Lucienne Capers M.D.   On: 11/07/2020 15:19   MR THORACIC SPINE WO CONTRAST  Result Date: 11/08/2020 CLINICAL DATA:  75 year old male with multiple myeloma undergoing treatment. 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 Cough. T7 vertebral fracture on recent CTA. EXAM: MRI THORACIC SPINE WITHOUT CONTRAST TECHNIQUE: Multiplanar, multisequence MR imaging of the thoracic spine was performed. No intravenous contrast was administered. COMPARISON:  CTA chest 11/07/2020.  Lumbar spine CT 11/07/2020. FINDINGS: Limited cervical spine imaging: Straightening of cervical lordosis, otherwise grossly normal. Thoracic spine segmentation: Normal, although the abnormal thoracic spine on recent chest CTA was miss numbered, the abnormal vertebra is T8. Alignment:  Exaggerated thoracic kyphosis.  No spondylolisthesis. Vertebrae: Severe marrow edema throughout the fractured T8 vertebral body, and patchy severe edema also in  the adjacent T9 anterior superior endplate. Trace marrow edema in the inferior posterior T7 endplate. And the comparison chest CTA demonstrates widespread thoracic spine ankylosis with the oblique fracture through T8 superimposed on underlying interbody ankylosis. However, there does not appear to be associated posterior element or posterior column disruption on either these images or the CT yesterday. Chronic moderate to severe T11 compression fracture superimposed. At other thoracic and visible upper lumbar levels marrow signal is within normal limits. However, there are occasional expansile posterior rib lesions (right posterior 8th rib on series 23, image 35). Cord: No spinal cord signal abnormality is identified. But note extensive thoracic epidural lipomatosis (maximal T5 through T7 series  21, image 16) effacing CSF from the thecal sac. Furthermore, heterogeneous increased STIR signal in the dorsal spinal canal at T5 through T7 is suspicious for epidural or subdural blood products. See series 22, image 16 and series 23, image 18. This contributes to the mild spinal stenosis at those levels. Conus medullaris appears to remain normal at T12-L1. Paraspinal and other soft tissues: Confluent right lateral paraspinal soft tissue edema centered at T8. No discrete soft tissue hematoma. Superimposed abnormal pleural spaces and lung parenchyma as seen by CT, including left lung fibrothorax with perhaps small superimposed acute pleural effusion. Stable visible upper abdominal viscera. Disc levels: Widespread thoracic spine ankylosis as demonstrated by CT. No significant degenerative changes to exacerbate the multifactorial spinal stenosis described in the cord section above. IMPRESSION: 1. T8 (not T7) Acute Vertebral Body Fracture is comminuted and oblique, and occurs in the setting of diffuse thoracic Spinal Ankylosis. However, there does not seem to be posterior element or posterior column disruption associated with this injury. Still, recommend Spine Surgery consultation if not already done in regard to immobilization/fixation. 2. Small volume dorsal epidural or subdural hematoma associated with #1 suspected from the T5 to the T7 level. And the blood is superimposed on widespread underlying thoracic epidural lipomatosis. Subsequent multilevel mild thoracic spinal stenosis, although no convincing cord compression or cord signal abnormality. 3. No other acute osseous abnormality in the thoracic spine. Chronic moderate to severe T11 compression fracture. Small expansile posterior right 8th rib myeloma lesion. 4. Abnormal lungs as seen on recent CT including a component of left fibrothorax. Electronically Signed   By: Genevie Ann M.D.   On: 11/08/2020 09:04     Scheduled Meds: . apixaban  5 mg Oral BID  .  ascorbic acid  500 mg Oral Daily  . aspirin  81 mg Oral Daily  . azithromycin  500 mg Oral Daily  . calcium-vitamin D  1 tablet Oral BID  . dextromethorphan-guaiFENesin  1 tablet Oral BID  . ezetimibe  10 mg Oral Daily  . insulin aspart  0-5 Units Subcutaneous QHS  . insulin aspart  0-9 Units Subcutaneous TID WC  . ipratropium-albuterol  3 mL Nebulization Q6H  . pantoprazole  40 mg Oral Daily  . propranolol  10 mg Oral BID   Continuous Infusions: . sodium chloride 500 mL (11/07/20 2345)  . cefTRIAXone (ROCEPHIN)  IV 2 g (11/08/20 2111)     LOS: 2 days    Time spent: 30 minutes with greater than 50% spent at bedside and in coordination of care   Ezekiel Slocumb, MD [facility] If 7PM-7AM, please contact night-coverage 11/09/2020, 2:42 PM

## 2020-11-10 ENCOUNTER — Inpatient Hospital Stay: Payer: Medicare Other

## 2020-11-10 ENCOUNTER — Ambulatory Visit: Payer: Medicare Other | Attending: Radiation Oncology

## 2020-11-10 ENCOUNTER — Inpatient Hospital Stay
Admit: 2020-11-10 | Discharge: 2020-11-10 | Disposition: A | Discharge status: Home / Self Care | Payer: Medicare Other | Attending: Internal Medicine | Admitting: Internal Medicine

## 2020-11-10 DIAGNOSIS — J189 Pneumonia, unspecified organism: Secondary | ICD-10-CM | POA: Diagnosis not present

## 2020-11-10 DIAGNOSIS — C4442 Squamous cell carcinoma of skin of scalp and neck: Secondary | ICD-10-CM | POA: Insufficient documentation

## 2020-11-10 DIAGNOSIS — Z510 Encounter for antineoplastic radiation therapy: Secondary | ICD-10-CM | POA: Insufficient documentation

## 2020-11-10 DIAGNOSIS — I10 Essential (primary) hypertension: Secondary | ICD-10-CM | POA: Diagnosis not present

## 2020-11-10 DIAGNOSIS — I5032 Chronic diastolic (congestive) heart failure: Secondary | ICD-10-CM | POA: Diagnosis not present

## 2020-11-10 DIAGNOSIS — I4891 Unspecified atrial fibrillation: Secondary | ICD-10-CM | POA: Diagnosis not present

## 2020-11-10 LAB — MAGNESIUM: Magnesium: 1.9 mg/dL (ref 1.7–2.4)

## 2020-11-10 LAB — GLUCOSE, CAPILLARY
Glucose-Capillary: 89 mg/dL (ref 70–99)
Glucose-Capillary: 96 mg/dL (ref 70–99)
Glucose-Capillary: 175 mg/dL — ABNORMAL HIGH (ref 70–99)
Glucose-Capillary: 98 mg/dL (ref 70–99)
Glucose-Capillary: 92 mg/dL (ref 70–99)
Glucose-Capillary: 121 mg/dL — ABNORMAL HIGH (ref 70–99)

## 2020-11-10 LAB — BASIC METABOLIC PANEL
Anion gap: 5 (ref 5–15)
BUN: 30 mg/dL — ABNORMAL HIGH (ref 8–23)
CO2: 25 mmol/L (ref 22–32)
Calcium: 8.5 mg/dL — ABNORMAL LOW (ref 8.9–10.3)
Chloride: 105 mmol/L (ref 98–111)
Creatinine, Ser: 0.84 mg/dL (ref 0.61–1.24)
GFR, Estimated: >60 mL/min (ref >60)
Glucose, Bld: 94 mg/dL (ref 70–99)
Potassium: 4.4 mmol/L (ref 3.5–5.1)
Sodium: 135 mmol/L (ref 135–145)

## 2020-11-10 NOTE — Progress Notes (Signed)
PROGRESS NOTE    Luis [name].  PJS:[id number] DOB: 03-11-1946 DOA: 11/07/2020 PCP: Tracie Harrier, MD    Assessment & Plan:   Principal Problem:   HCAP (healthcare-associated pneumonia) Active Problems:   Multiple myeloma ([place])   Benign essential HTN   Type 2 diabetes mellitus without complication, without long-term current use of insulin (HCC)   CAD (coronary artery disease) of artery bypass graft   Squamous cell skin cancer   Severe sepsis (HCC)   HLD (hyperlipidemia)   GERD (gastroesophageal reflux disease)   Depression with anxiety   Chronic diastolic CHF (congestive heart failure) (HCC)   Elevated troponin   Atrial fibrillation with RVR ([place])   T7 vertebral fracture (HCC)   Aspiration pneumonia (HCC)    Luis [name]. is a 75 y.o. male with medical history significant of HTN, HLD, DM, MM, scalp skin CA on XRT, CAD, CABG, dCHF, presents with shortness of breath.   Acute respiratory failure with hypoxia    --CTA negative for PE, but showed right basilar opacity, consistent with pneumonia.   --Patient has oxygen desaturated to 88% on room air, which improved to 94% on 3 L oxygen in ED. Plan: --treat pneumonia as below --Continue supplemental O2 to keep sats >=92%, wean as tolerated  severe sepsis due to possible CAP vs. aspiration pneumonia:  --presented with tachycardia, tachypnea, elevated lactic acid 2.7, 2.9.  source PNA. - started on IV Vancomycin and cefepime, flagyl on admission --procal 0.21 on presentation >> 0.32  --Seen by speech therapy in no aspiration issue was seen Plan: --Continue Rocephin and Zithromax --Mucinex twice daily with as needed Robitussin-DM --scheduled DuoNeb   New onset A fib with RVR:  Acquired thrombophilia  CHA2DS2-VASc Score is 6, needs anticoagulation.  V heparin started in ED. Dr. Clayborn Bigness of card is consulted. HR 126 -->90s now. -pt received 100 mg of Cardizem in the ED, since rate controlled --cardiology  recommended short-term anticoagulation. Plan: --Continue home propranolol --Continue Eliquis, will continue for at least a month --Follow-up with cardiology outpatient  Elevated troponin 2/2 demand ischemia hx of CAD S/p of CABG.  trop 167 -->148.    Chest pain is musculoskeletal in nature and exacerbated by coughing. Elevated troponin due to due to demand ischemia in setting of pneumonia. Plan: --cont ASA and Zetia (patient is allergic to several statins)  T8 vertebral fracture [facility]):  consulted neurosurgeon, Dr. Johnney Killian saw pt.  Plan: -TLSO brace when out of bed --follow up with Neurosurgery  Multiple myeloma [facility]): Patient was on Revlimid, taken off few months ago by oncologist -f/u with oncology  Benign essential HTN -Continue propranolol.  Hold losartan due to soft BP  Type 2 diabetes mellitus without complication, without long-term current use of insulin ([place]):  Recent A1c 5.2, well controlled.    Hold Metformin. -Sliding scale insulin  Squamous cell skin cancer: On scalp  -Patient is treated with radiation therapy by dermatologist, last treatment was last Tuesday  HLD (hyperlipidemia) --cont Zetia (patient is allergic to several statins)  GERD (gastroesophageal reflux disease) -Protonix  Depression with anxiety: Patient used to be on Paxil, no longer taking --cont Xanax PRN -increase to twice daily as needed as patient with increased anxiety today 1/2  Chronic diastolic CHF (congestive heart failure) ([place]):  2D echo on 03/17/2018 showed EF of 50%.  Patient has trace leg edema, no pulmonary edema chest x-ray. BNP is elevated 815, however patient appears clinically euvolemic at this time.  Monitor volume status closely. Plan: --  d/c MIVF   DVT prophylaxis: DJ:SHFWYOV Code Status: Full code  Family Communication:  Status is: inpatient Inpatient status remains appropriate due to severity of illness requiring supplemental oxygen and IV therapies as  above.  Dispo:   The patient is from: home Anticipated d/c is to: home Anticipated d/c date is: 2-3 days    Subjective and Interval History:  Pt reported a lot of coughing, with cumulative large amount of sputum production.  Admitted to some back pain.   Objective: Vitals:   11/10/20 0739 11/10/20 0815 11/10/20 1430 11/10/20 1601  BP:  134/62  122/66  Pulse:  (!) 55  (!) 53  Resp:  18  16  Temp:  (!) 97.4 F (36.3 C)  97.7 F (36.5 C)  TempSrc:  Oral  Oral  SpO2: 95% 95% 97% 97%  Weight:      Height:        Intake/Output Summary (Last 24 hours) at 11/10/2020 1638 Last data filed at 11/10/2020 1019 Gross per 24 hour  Intake 830 ml  Output 1500 ml  Net -670 ml   Filed Weights   11/07/20 0916 11/08/20 0146 11/09/20 0337  Weight: 85.8 kg 83.8 kg 85.9 kg    Examination:   Constitutional: Awake and alert, no acute distress HEENT: Moist mucous membranes, hearing grossly normal CV: RRR, no peripheral edema RESP: frequent cough, on 2L Extremities: Moves all, no cyanosis or edema  SKIN: Ulcerated lesion on top of the scalp, otherwise dry and intact Neuro: Grossly nonfocal exam, normal speech Psych: Normal mood and affect.  Appropriate judgement and reason   Data Reviewed: I have personally reviewed following labs and imaging studies  CBC: Recent Labs  Lab 11/06/20 2237 11/08/20 0524 11/09/20 0602  WBC 6.5 6.6 8.1  NEUTROABS 5.1  --   --   HGB 12.6* 10.9* 10.8*  HCT 37.7* 31.1* 31.9*  MCV 111.9* 109.1* 110.4*  PLT 137* 123* 785*   Basic Metabolic Panel: Recent Labs  Lab 11/06/20 2237 11/08/20 0524 11/09/20 0602 11/10/20 0600  NA 140 132* 132* 135  K 3.9 4.0 4.4 4.4  CL 105 102 102 105  CO2 24 21* 22 25  GLUCOSE 119* 170* 124* 94  BUN 32* 43* 40* 30*  CREATININE 1.00 1.01 0.90 0.84  CALCIUM 9.0 8.2* 8.5* 8.5*  MG  --   --  2.1 1.9   GFR: Estimated Creatinine Clearance: 76.8 mL/min (by C-G formula based on SCr of 0.84 mg/dL). Liver Function  Tests: No results for input(s): AST, ALT, ALKPHOS, BILITOT, PROT, ALBUMIN in the last 168 hours. No results for input(s): LIPASE, AMYLASE in the last 168 hours. No results for input(s): AMMONIA in the last 168 hours. Coagulation Profile: Recent Labs  Lab 11/07/20 0450  INR 1.4*   Cardiac Enzymes: No results for input(s): CKTOTAL, CKMB, CKMBINDEX, TROPONINI in the last 168 hours. BNP (last 3 results) No results for input(s): PROBNP in the last 8760 hours. HbA1C: Recent Labs    11/08/20 0524  HGBA1C 5.3   CBG: Recent Labs  Lab 11/09/20 2116 11/10/20 0812 11/10/20 0830 11/10/20 1153 11/10/20 1559  GLUCAP 122* 89 96 175* 98   Lipid Profile: Recent Labs    11/08/20 0524  CHOL 97  HDL 45  LDLCALC 42  TRIG 52  CHOLHDL 2.2   Thyroid Function Tests: No results for input(s): TSH, T4TOTAL, FREET4, T3FREE, THYROIDAB in the last 72 hours. Anemia Panel: No results for input(s): VITAMINB12, FOLATE, FERRITIN, TIBC, IRON, RETICCTPCT in the  last 72 hours. Sepsis Labs: Recent Labs  Lab 11/07/20 0235 11/07/20 0549 11/07/20 0729 11/07/20 1020 11/07/20 1248 11/08/20 0524  PROCALCITON 0.21  --   --   --   --  0.32  LATICACIDVEN  --  2.7* 2.9* 3.7* 3.6*  --     Recent Results (from the past 240 hour(s))  Resp Panel by RT-PCR (Flu A&B, Covid) Nasopharyngeal Swab     Status: None   Collection Time: 11/07/20  2:35 AM   Specimen: Nasopharyngeal Swab; Nasopharyngeal(NP) swabs in vial transport medium  Result Value Ref Range Status   SARS Coronavirus 2 by RT PCR NEGATIVE NEGATIVE Final    Comment: (NOTE) SARS-CoV-2 target nucleic acids are NOT DETECTED.  The SARS-CoV-2 RNA is generally detectable in upper respiratory specimens during the acute phase of infection. The lowest concentration of SARS-CoV-2 viral copies this assay can detect is 138 copies/mL. A negative result does not preclude SARS-Cov-2 infection and should not be used as the sole basis for treatment or other  patient management decisions. A negative result may occur with  improper specimen collection/handling, submission of specimen other than nasopharyngeal swab, presence of viral mutation(s) within the areas targeted by this assay, and inadequate number of viral copies(<138 copies/mL). A negative result must be combined with clinical observations, patient history, and epidemiological information. The expected result is Negative.  Fact Sheet for Patients:  EntrepreneurPulse.com.au  Fact Sheet for Healthcare Providers:  IncredibleEmployment.be  This test is no t yet approved or cleared by the [place] FDA and  has been authorized for detection and/or diagnosis of SARS-CoV-2 by FDA under an Emergency Use Authorization (EUA). This EUA will remain  in effect (meaning this test can be used) for the duration of the COVID-19 declaration under Section 564(b)(1) of the Act, 21 U.S.C.section 360bbb-3(b)(1), unless the authorization is terminated  or revoked sooner.       Influenza A by PCR NEGATIVE NEGATIVE Final   Influenza B by PCR NEGATIVE NEGATIVE Final    Comment: (NOTE) The Xpert Xpress SARS-CoV-2/FLU/RSV plus assay is intended as an aid in the diagnosis of influenza from Nasopharyngeal swab specimens and should not be used as a sole basis for treatment. Nasal washings and aspirates are unacceptable for Xpert Xpress SARS-CoV-2/FLU/RSV testing.  Fact Sheet for Patients: EntrepreneurPulse.com.au  Fact Sheet for Healthcare Providers: IncredibleEmployment.be  This test is not yet approved or cleared by the [place] FDA and has been authorized for detection and/or diagnosis of SARS-CoV-2 by FDA under an Emergency Use Authorization (EUA). This EUA will remain in effect (meaning this test can be used) for the duration of the COVID-19 declaration under Section 564(b)(1) of the Act, 21 U.S.C. section  360bbb-3(b)(1), unless the authorization is terminated or revoked.  Performed at [facility], [address] [place], [place] [address]   Culture, blood (single)     Status: None (Preliminary result)   Collection Time: 11/07/20  5:50 AM   Specimen: BLOOD  Result Value Ref Range Status   Specimen Description BLOOD LFA  Final   Special Requests   Final    BOTTLES DRAWN AEROBIC AND ANAEROBIC Blood Culture adequate volume   Culture   Final    NO GROWTH 3 DAYS Performed at [facility], [address] [place], [place] [address]    Report Status PENDING  Incomplete  MRSA PCR Screening     Status: None   Collection Time: 11/07/20 12:39 PM   Specimen: Nasal Mucosa; Nasopharyngeal  Result  Value Ref Range Status   MRSA by PCR NEGATIVE NEGATIVE Final    Comment:        The GeneXpert MRSA Assay (FDA approved for NASAL specimens only), is one component of a comprehensive MRSA colonization surveillance program. It is not intended to diagnose MRSA infection nor to guide or monitor treatment for MRSA infections. Performed at [facility], [address], [place], [place] [address]       Radiology Studies: No results found.   Scheduled Meds: . apixaban  5 mg Oral BID  . ascorbic acid  500 mg Oral Daily  . aspirin  81 mg Oral Daily  . azithromycin  500 mg Oral Daily  . calcium-vitamin D  1 tablet Oral BID  . dextromethorphan-guaiFENesin  1 tablet Oral BID  . ezetimibe  10 mg Oral Daily  . insulin aspart  0-5 Units Subcutaneous QHS  . insulin aspart  0-9 Units Subcutaneous TID WC  . ipratropium-albuterol  3 mL Nebulization Q6H  . pantoprazole  40 mg Oral Daily  . propranolol  10 mg Oral BID   Continuous Infusions: . sodium chloride 500 mL (11/07/20 2345)  . cefTRIAXone (ROCEPHIN)  IV 2 g (11/09/20 2130)     LOS: 3 days    Time spent: 25 minutes with greater than 50% spent at bedside and in coordination of care   Ezekiel Slocumb,  MD [facility] If 7PM-7AM, please contact night-coverage 11/10/2020, 4:38 PM

## 2020-11-10 NOTE — Care Management Important Message (Signed)
Important Message  Patient Details  Name: Luis [name]. MRN: [id number] Date of Birth: [birth year]   Medicare Important Message Given:  Yes     [name] 11/10/2020, 2:06 PM

## 2020-11-10 NOTE — Progress Notes (Signed)
[facility] Cardiology    SUBJECTIVE: Patient states shortness of breath better coughing is better.  Patient still has some congestion improved wheezing has not ambulated much she still feels relatively weak lying in bed but is much improved from before   Vitals:   11/09/20 2117 11/10/20 0546 11/10/20 0739 11/10/20 0815  BP: (!) 127/59 (!) 134/57  134/62  Pulse: (!) 47 (!) 56  (!) 55  Resp:    18  Temp: 97.7 F (36.5 C) 98 F (36.7 C)  (!) 97.4 F (36.3 C)  TempSrc: Oral Oral  Oral  SpO2: 98% 96% 95% 95%  Weight:      Height:         Intake/Output Summary (Last 24 hours) at 11/10/2020 1047 Last data filed at 11/10/2020 1019 Gross per 24 hour  Intake 1070 ml  Output 1500 ml  Net -430 ml      PHYSICAL EXAM  General: Well developed, well nourished, in no acute distress HEENT:  Normocephalic and atramatic Neck:  No JVD.  Lungs: Clear bilaterally to auscultation and percussion. Heart: Bradycardia. Normal S1 and S2 without gallops or murmurs.  Abdomen: Bowel sounds are positive, abdomen soft and non-tender  Msk:  Back normal, normal gait. Normal strength and tone for age. Extremities: No clubbing, cyanosis or edema.   Neuro: Alert and oriented X 3. Psych:  Good affect, responds appropriately   LABS: Basic Metabolic Panel: Recent Labs    11/09/20 0602 11/10/20 0600  NA 132* 135  K 4.4 4.4  CL 102 105  CO2 22 25  GLUCOSE 124* 94  BUN 40* 30*  CREATININE 0.90 0.84  CALCIUM 8.5* 8.5*  MG 2.1 1.9   Liver Function Tests: No results for input(s): AST, ALT, ALKPHOS, BILITOT, PROT, ALBUMIN in the last 72 hours. No results for input(s): LIPASE, AMYLASE in the last 72 hours. CBC: Recent Labs    11/08/20 0524 11/09/20 0602  WBC 6.6 8.1  HGB 10.9* 10.8*  HCT 31.1* 31.9*  MCV 109.1* 110.4*  PLT 123* 134*   Cardiac Enzymes: No results for input(s): CKTOTAL, CKMB, CKMBINDEX, TROPONINI in the last 72 hours. BNP: Invalid input(s): POCBNP D-Dimer: No results for input(s):  DDIMER in the last 72 hours. Hemoglobin A1C: Recent Labs    11/08/20 0524  HGBA1C 5.3   Fasting Lipid Panel: Recent Labs    11/08/20 0524  CHOL 97  HDL 45  LDLCALC 42  TRIG 52  CHOLHDL 2.2   Thyroid Function Tests: No results for input(s): TSH, T4TOTAL, T3FREE, THYROIDAB in the last 72 hours.  Invalid input(s): FREET3 Anemia Panel: No results for input(s): VITAMINB12, FOLATE, FERRITIN, TIBC, IRON, RETICCTPCT in the last 72 hours.  No results found.   Echo pending  TELEMETRY: Normal sinus rhythm rate of 65:  ASSESSMENT AND PLAN:  Principal Problem:   HCAP (healthcare-associated pneumonia) Active Problems:   Multiple myeloma (HCC)   Benign essential HTN   Type 2 diabetes mellitus without complication, without long-term current use of insulin (HCC)   CAD (coronary artery disease) of artery bypass graft   Squamous cell skin cancer   Severe sepsis (HCC)   HLD (hyperlipidemia)   GERD (gastroesophageal reflux disease)   Depression with anxiety   Chronic diastolic CHF (congestive heart failure) (HCC)   Elevated troponin   Atrial fibrillation with RVR (HCC)   T7 vertebral fracture (HCC)   Aspiration pneumonia (HCC)    Plan Asthmatic bronchitis continue inhalers steroid broad-spectrum antibiotic therapy Evidence of pneumonia continue antibiotic  therapy broad-spectrum Continue diabetes management and control Reflux therapy Protonix Supplemental oxygen as necessary Appreciate neurosurgery's input for vertebral disease secondary multiple myeloma Continue statin therapy for hyperlipidemia Increase activity have the patient out of bed to chair and consider physical therapy Heart failure is playing a minimal role at this point  no invasive cardiac studies are suggested   Yolonda Kida, MD 11/10/2020 10:47 AM

## 2020-11-10 NOTE — Evaluation (Signed)
Occupational Therapy Evaluation Patient Details Name: Luis [name]. MRN: [id number] DOB: [birth year] Today's Date: 11/10/2020    History of Present Illness Luis [name]. is a 75 y.o. male with medical history significant of HTN, HLD, DM, MM, scalp skin CA on XRT, CAD, CABG, dCHF, presented to hospital ED on 11/07/20 with shortness of breath. Patient admitted for acute respiratory failure with hypoxia and severe sepsis due to possible HCAP vs. aspiration pneumonia, new onset A fib with RVR, elevated troponin (determined to be demand ischemia), T7 vertebral fracture (requires TLSO when out of bed), multiple myeloma, chronic diatolic CHF.  CTA negative for PE, but showed right basilar opacity. Consults from cardiology and neurosurgery (concern for unstable T7 fracture - reccomended TLSO when out of bed).   Clinical Impression   Pt seen for OT evaluation this date. Upon arrival, pt was awake and sitting in the chair. Prior to hospital admission, pt reports he was MOD-I with ADLs (using AE/compensatory strategies for dressing). Of note, pt is unable to flex b/l shoulders >80 degrees at baseline, impacting UB dressing. Pt reports being able to only make simple meals independently. Prior to admission, pt used a cane for functional mobility and reports having 3 falls in the past 6 months. Son was present at end of session, and expressed concerns regarding pt living alone and driving. Of note, son expressed that he would prefer pt reside in an assistive living community.   During session, pt required MAX A to don TLSO due to limited UE ROM. Pt was able to walk chair<>sink with cane and min guard, however, reported feeling fatigued (7/10) after task. Pt educated on energy conservation strategies, home/routines modifications, and falls prevention. Pt also educated on discharge recomendations with pt and son verbalizing understanding. Pt would benefit from additional skilled OT services to maximize recall and  carryover of learned techniques and facilitate implementation of learned techniques into daily routines. Upon discharge, recommend SNF.    Follow Up Recommendations  SNF    Equipment Recommendations  Other (comment) (defer to next venue of care)       Precautions / Restrictions Precautions Precautions: Fall Required Braces or Orthoses: Spinal Brace Spinal Brace: Thoracolumbosacral orthotic (During OOB mobility) Restrictions Weight Bearing Restrictions: No      Mobility  Transfers Overall transfer level: Needs assistance Equipment used: Straight cane Transfers: Sit to/from Stand Sit to Stand: Min guard         General transfer comment: Required cues for hand placement for more controlled descent    Balance   Standing balance support: Single extremity supported;During functional activity Standing balance-Leahy Scale: Fair Standing balance comment: Standing sink-side                           ADL either performed or assessed with clinical judgement   ADL Overall ADL's : Needs assistance/impaired                 Upper Body Dressing : Maximal assistance;Cueing for safety;Cueing for sequencing;Sitting Upper Body Dressing Details (indicate cue type and reason): To don/doff TLSO. Pt reports having difficulty with UB dressing at baseline in s/o limited UE ROM                 Functional mobility during ADLs: Min guard;Cueing for safety;Cane General ADL Comments: Following functional mobility (chair<>sink), pt reported feeling extremely fatigued (fatigue scale: 7/10)     Vision Baseline Vision/History: Wears glasses  Pertinent Vitals/Pain Pain Assessment: 0-10 Pain Score: 4  Pain Location: Back Pain Intervention(s): Limited activity within patient's tolerance;Monitored during session;Repositioned     Hand Dominance Right   Extremity/Trunk Assessment Upper Extremity Assessment Upper Extremity Assessment: RUE deficits/detail;LUE  deficits/detail RUE Deficits / Details: Hx of shoulder surgery. Unable to flex shoulder >80 degrees, impacting UB dressing at baseline RUE Sensation: WNL RUE Coordination: WNL LUE Deficits / Details: Unable to flex shoulder >80 degrees, impacting UB dressing at baseline LUE Sensation: WNL LUE Coordination: WNL   Lower Extremity Assessment Lower Extremity Assessment: Defer to PT evaluation   Cervical / Trunk Assessment Cervical / Trunk Assessment: Kyphotic   Communication Communication Communication: No difficulties   Cognition Arousal/Alertness: Awake/alert Behavior During Therapy: WFL for tasks assessed/performed Overall Cognitive Status: Within Functional Limits for tasks assessed                                                Home Living Family/patient expects to be discharged to:: Private residence Living Arrangements: Alone Available Help at Discharge: Family;Available PRN/intermittently (Oldest son lives outside [place], youngest lives in [place]) Type of Home: House Home Access: Stairs to enter Technical brewer of Steps: 2 Entrance Stairs-Rails: None Home Layout: One level     Bathroom Shower/Tub: Walk-in shower;Tub/shower unit   Bathroom Toilet: Handicapped height Bathroom Accessibility: No   Home Equipment: Walker - 2 wheels;Cane - single point;Grab bars - tub/shower   Additional Comments: Pt reports that the shower is not big enough to put a seat in and that he would prefer not to use other bathroom with tub/shower      Prior Functioning/Environment Level of Independence: Independent with assistive device(s)  Gait / Transfers Assistance Needed: uses a SPC for functional mobility ADL's / Homemaking Assistance Needed: Pt reports he is MOD-I with BADLs (uses AE/compensatory strategies for dressing). Pt reports that he is able to make simple meals, but usually orders food/obtains food from family/friends   Comments: Wife passed away 1  year ago. Has had three falls in the last 6 months after he was discharged from hospitalization with [facility]. Son reports that family has concerns about him living alone and driving, and would prefer pt reside in an assistive living community        OT Problem List: Decreased strength;Decreased range of motion;Decreased activity tolerance;Impaired balance (sitting and/or standing);Decreased safety awareness;Decreased knowledge of precautions      OT Treatment/Interventions: Self-care/ADL training;Therapeutic exercise;Energy conservation;DME and/or AE instruction;Therapeutic activities;Patient/family education;Balance training    OT Goals(Current goals can be found in the care plan section) Acute Rehab OT Goals Patient Stated Goal: get stronger OT Goal Formulation: With patient Time For Goal Achievement: 11/24/20 Potential to Achieve Goals: Good ADL Goals Pt Will Perform Grooming: with modified independence;standing Pt Will Perform Upper Body Dressing: sitting;with min assist (to don TLSO) Pt Will Perform Tub/Shower Transfer: with modified independence;ambulating;tub bench  OT Frequency: Min 1X/week   Barriers to D/C: Decreased caregiver support             AM-PAC OT "6 Clicks" Daily Activity     Outcome Measure Help from another person eating meals?: None Help from another person taking care of personal grooming?: A Little Help from another person toileting, which includes using toliet, bedpan, or urinal?: A Little Help from another person bathing (including washing, rinsing, drying)?: A Lot Help from  another person to put on and taking off regular upper body clothing?: A Lot Help from another person to put on and taking off regular lower body clothing?: A Lot 6 Click Score: 16   End of Session Nurse Communication: Mobility status  Activity Tolerance: Patient tolerated treatment well Patient left: in chair;with call bell/phone within reach;with chair alarm set;with  nursing/sitter in room;with family/visitor present  OT Visit Diagnosis: Unsteadiness on feet (R26.81);History of falling (Z91.81)                Time: 8118-8677 OT Time Calculation (min): 35 min Charges:  OT General Charges $OT Visit: 1 Visit OT Evaluation $OT Eval Moderate Complexity: 1 Mod OT Treatments $Self Care/Home Management : 8-22 mins $Therapeutic Activity: 8-22 mins  Fredirick Maudlin, OTR/L [facility] [phone]   11/10/2020, 2:07 PM

## 2020-11-10 NOTE — Evaluation (Signed)
Physical Therapy Evaluation Patient Details Name: Luis [name]. MRN: [id number] DOB: November 14, 1945 Today's Date: 11/10/2020   History of Present Illness  Luis [name]. is a 75 y.o. male with medical history significant of HTN, HLD, DM, MM, scalp skin CA on XRT, CAD, CABG, dCHF, presented to hospital ED on 11/07/20 with shortness of breath. Patient admitted for acute respiratory failure with ypoxia and severe sepsis due to possible HCAP vs. aspiration pneumonia, new onset A fib with RVR, elevated troponin (determined to be demand ischemia), T7 vertebral fracture (requires TLSO when out of bed), multiple myeloma, chronic diatolic CHF.  CTA negative for PE, but showed right basilar opacity. Consults from cardiology and neurosurgery (concern for unstable T7 fracture - reccomended TLSO when out of bed).    Clinical Impression  Patient very pleasant, awake and alert x 4 and able to provide a detailed history. Patient lives alone in a single story home with 2 steps with no railings to enter and has had three falls in the last 6 months. He sleeps in a lift chair and ambulates without assistance using [facility] everywhere he goes. He has two sons who live near [place].  He reports he was fully independent prior to hospitalization. Upon PT evaluation, patient was able to complete bed mobility, transfers and ambulation with CGA for safety but required prolonged time, required multiple attempts to stand due to weakness, and used very careful shuffling gait pattern. He did not appear safe to be mobilizing out of bed without assistance to prevent falls. He also required assistance to don TLSO. Patient appears to have experienced a significant decrease in functional mobility and independence and recommend he complete short term rehab prior to returning home for safety. Patient would benefit from skilled physical therapy to address impairments and functional limitations (see PT Problem List below) to work  towards stated goals and return to PLOF or maximal functional independence.       Follow Up Recommendations SNF    Equipment Recommendations  3in1 (PT)    Recommendations for Other Services OT consult     Precautions / Restrictions Precautions Precautions: Fall Required Braces or Orthoses: Spinal Brace Spinal Brace: Thoracolumbosacral orthotic (When out of bed) Restrictions Weight Bearing Restrictions: No      Mobility  Bed Mobility Overal bed mobility: Modified Independent Bed Mobility: Supine to Sit     Supine to sit: HOB elevated;Min guard     General bed mobility comments: Patient able to perform supine <> sit with CGA for safety and head of bed almost upright. Patient states he sleeps in a recliner. Patient requires moderate assistance to don TLFO.    Transfers Overall transfer level: Needs assistance Equipment used: Straight cane Transfers: Sit to/from Stand Sit to Stand: Min guard         General transfer comment: patient performs sit <> stand transfers with good safety awareness and self talk to keep head over toes. Does require multiple attempts and states he has a lift chair at home so his legs are weak.  Ambulation/Gait Ambulation/Gait assistance: Min guard Gait Distance (Feet): 120 Feet Assistive device: Straight cane Gait Pattern/deviations: Decreased step length - right;Decreased step length - left;Decreased stride length;Trunk flexed;Shuffle Gait velocity: very slow   General Gait Details: Patient ambulated ~ 30 feet to the bathroom and back using SPC and CGA for safety on room air. SpO2 dropped as low as 84%. Re-applied 2L/min O2 for remainder of session and patient ambulated 120 feet  in hallway with [facility] and CGA for safety. Demonstrated slow shuffling gait with no loss of balance but very careful and stooped at the spine. Required two standing breaks during ambulation in the hall and SpO2 remained at 93% on 2L/min. HR WFL.  Stairs             Wheelchair Mobility    Modified Rankin (Stroke Patients Only)       Balance Overall balance assessment: Needs assistance Sitting-balance support: Feet supported Sitting balance-Leahy Scale: Good Sitting balance - Comments: patient able to sit steady at edge of bed. Unable to sit up completely due to hyperkypyhosis at thoracic spine.     Standing balance-Leahy Scale: Fair Standing balance comment: Patient able to remain standing and use urinal with CGA for safety.  Required use of SPC and CGA for safety when ambulating.                             Pertinent Vitals/Pain Pain Assessment: 0-10 Pain Score: 4  Pain Location: back, sides, abdomen a little. When moving. Pain Intervention(s): Limited activity within patient's tolerance;Monitored during session;Repositioned    Home Living Family/patient expects to be discharged to:: Private residence Living Arrangements: Alone Available Help at Discharge:  (none. Oldest son lives outside [place], youngest lives in [place]) Type of Home: House Home Access: Stairs to enter Entrance Stairs-Rails: None Entrance Stairs-Number of Steps: 2 Home Layout: One level Home Equipment: Environmental consultant - 2 wheels;Cane - single point;Grab bars - tub/shower Additional Comments: states shower is not big enough to put a seat in. States he has a lift chair where he sleeps.    Prior Function Level of Independence: Independent with assistive device(s)   Gait / Transfers Assistance Needed: ambulates with SPC in the home and out the community.  ADL's / Homemaking Assistance Needed: independent with all ADLs and IDLs.  Comments: Wife passed away 1 year ago. Has had three falls in the last 6 months after he was discharged from hospitalization with [facility].     Hand Dominance   Dominant Hand: Right    Extremity/Trunk Assessment   Upper Extremity Assessment Upper Extremity Assessment: Generalized weakness    Lower Extremity  Assessment Lower Extremity Assessment: Generalized weakness    Cervical / Trunk Assessment Cervical / Trunk Assessment: Kyphotic  Communication   Communication: No difficulties  Cognition Arousal/Alertness: Awake/alert Behavior During Therapy: WFL for tasks assessed/performed Overall Cognitive Status: Within Functional Limits for tasks assessed                                        General Comments General comments (skin integrity, edema, etc.): Patient with healing wound on left side of head. TLSO donned entire time out of bed.    Exercises Other Exercises Other Exercises: educated patient and son Aaron Edelman (on the phone) about the role of PT in acute care setting. educated patient on discharge reccomendations. Practiced safe mobility and assisted patient with going to the bathroom.   Assessment/Plan    PT Assessment Patient needs continued PT services  PT Problem List Decreased strength;Cardiopulmonary status limiting activity;Pain;Decreased range of motion;Decreased activity tolerance;Decreased knowledge of use of DME;Decreased balance;Decreased mobility;Decreased knowledge of precautions;Decreased skin integrity       PT Treatment Interventions DME instruction;Balance training;Neuromuscular re-education;Gait training;Stair training;Functional mobility training;Patient/family education;Therapeutic activities;Therapeutic exercise    PT Goals (Current goals  can be found in the Care Plan section)  Acute Rehab PT Goals Patient Stated Goal: get stronger PT Goal Formulation: With patient Time For Goal Achievement: 11/24/20 Potential to Achieve Goals: Good    Frequency Min 2X/week   Barriers to discharge Decreased caregiver support patient lives alone    Co-evaluation               AM-PAC PT "6 Clicks" Mobility  Outcome Measure Help needed turning from your back to your side while in a flat bed without using bedrails?: A Little Help needed moving from  lying on your back to sitting on the side of a flat bed without using bedrails?: A Little Help needed moving to and from a bed to a chair (including a wheelchair)?: A Little Help needed standing up from a chair using your arms (e.g., wheelchair or bedside chair)?: A Little Help needed to walk in hospital room?: A Little Help needed climbing 3-5 steps with a railing? : A Lot 6 Click Score: 17    End of Session Equipment Utilized During Treatment: Gait belt;Back brace;Oxygen (TLSO when OOB; 2L/min O2 after trip to bathroom) Activity Tolerance: Patient tolerated treatment well;Patient limited by fatigue Patient left: in chair;with call bell/phone within reach;with chair alarm set Nurse Communication: Mobility status PT Visit Diagnosis: Unsteadiness on feet (R26.81);Difficulty in walking, not elsewhere classified (R26.2);Repeated falls (R29.6);Muscle weakness (generalized) (M62.81)    Time: 2194-7125 PT Time Calculation (min) (ACUTE ONLY): 40 min   Charges:   PT Evaluation $PT Eval Moderate Complexity: 1 Mod PT Treatments $Gait Training: 8-22 mins $Therapeutic Activity: 8-22 mins       Everlean Alstrom. Graylon Good, PT, DPT 11/10/20, 11:45 AM

## 2020-11-10 NOTE — Progress Notes (Signed)
*  PRELIMINARY RESULTS* Echocardiogram 2D Echocardiogram has been performed.  Luis [name] 11/10/2020, 1:13 PM

## 2020-11-11 ENCOUNTER — Ambulatory Visit: Payer: Medicare Other

## 2020-11-11 DIAGNOSIS — C4492 Squamous cell carcinoma of skin, unspecified: Secondary | ICD-10-CM | POA: Diagnosis not present

## 2020-11-11 DIAGNOSIS — J189 Pneumonia, unspecified organism: Secondary | ICD-10-CM | POA: Diagnosis not present

## 2020-11-11 DIAGNOSIS — I5032 Chronic diastolic (congestive) heart failure: Secondary | ICD-10-CM | POA: Diagnosis not present

## 2020-11-11 DIAGNOSIS — I10 Essential (primary) hypertension: Secondary | ICD-10-CM | POA: Diagnosis not present

## 2020-11-11 LAB — GLUCOSE, CAPILLARY
Glucose-Capillary: 83 mg/dL (ref 70–99)
Glucose-Capillary: 133 mg/dL — ABNORMAL HIGH (ref 70–99)
Glucose-Capillary: 146 mg/dL — ABNORMAL HIGH (ref 70–99)
Glucose-Capillary: 148 mg/dL — ABNORMAL HIGH (ref 70–99)

## 2020-11-11 LAB — ECHOCARDIOGRAM COMPLETE
Height: 66 in
S' Lateral: 3.57 cm
Weight: 3028.8 oz

## 2020-11-11 LAB — MAGNESIUM: Magnesium: 1.7 mg/dL (ref 1.7–2.4)

## 2020-11-11 MED ORDER — HYDROCOD POLST-CPM POLST ER 10-8 MG/5ML PO SUER
5.0000 mL | Freq: Two times a day (BID) | ORAL | Status: DC | PRN
  Administered 2020-11-12: 5 mL via ORAL
  Filled 2020-11-11: qty 5

## 2020-11-11 MED ORDER — MAGNESIUM SULFATE 2 GM/50ML IV SOLN
2.0000 g | Freq: Once | INTRAVENOUS | Status: AC
  Administered 2020-11-11: 2 g via INTRAVENOUS
  Filled 2020-11-11: qty 50

## 2020-11-11 NOTE — NC FL2 (Cosign Needed)
[place] LEVEL OF CARE SCREENING TOOL     IDENTIFICATION  Patient Name: Luis [name]. Birthdate: [birth year] Sex: male Admission Date (Current Location): 11/07/2020  [place] and [place] Number:  Engineering geologist and Address:  [facility], [address] [place],  [address]      Provider Number: [id number]  Attending Physician Name and Address:  Ezekiel Slocumb, DO  Relative Name and Phone Number:  Joniel Graumann [phone]    Current Level of Care: Hospital Recommended Level of Care: [place] Prior Approval Number:    Date Approved/Denied:   PASRR Number:  [id number]  Discharge Plan: SNF    Current Diagnoses: Patient Active Problem List   Diagnosis Date Noted  . HCAP (healthcare-associated pneumonia) 11/07/2020  . Severe sepsis ([place]) 11/07/2020  . HLD (hyperlipidemia) 11/07/2020  . GERD (gastroesophageal reflux disease) 11/07/2020  . Depression with anxiety 11/07/2020  . Chronic diastolic CHF (congestive heart failure) ([place]) 11/07/2020  . Elevated troponin 11/07/2020  . Atrial fibrillation with RVR ([place]) 11/07/2020  . T7 vertebral fracture ([place]) 11/07/2020  . Aspiration pneumonia ([place]) 11/07/2020  . Squamous cell skin cancer 09/18/2020  . Scalp lesion 08/24/2020  . Maintenance chemotherapy 06/14/2020  . COVID-19 virus infection (vaccinated Pfizer 01/2020) 06/14/2020  . COVID-19 06/14/2020  . Weight loss 11/22/2019  . Compression fracture of body of thoracic vertebra ([place]) 09/19/2019  . Macrocytic anemia 03/25/2019  . Renal insufficiency 03/25/2019  . Abnormal iron saturation 02/18/2019  . Bilateral carotid artery stenosis 03/21/2018  . Connective tissue and disc stenosis of intervertebral foramina of lumbar region 02/06/2018  . Hip strain, initial encounter 02/06/2018  . History of total right hip replacement 02/06/2018  . Obesity (BMI 30-39.9) 02/06/2018  . Goals of care,  counseling/discussion 11/18/2017  . Drug-induced neutropenia ([place]) 01/23/2017  . Hypocalcemia 06/28/2016  . Type 2 diabetes mellitus without complication, without long-term current use of insulin ([place]) 02/17/2016  . Arthritis 05/01/2015  . TI (tricuspid incompetence) 04/16/2015  . Benign essential HTN 04/04/2015  . B12 deficiency 03/31/2015  . Diarrhea 03/31/2015  . Multiple myeloma ([place]) 03/27/2015  . 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 MI (mitral incompetence) 09/17/2014  . Obstructive apnea 09/17/2014  . Neuritis or radiculitis due to rupture of lumbar intervertebral disc 08/27/2014  . Lumbar canal stenosis 08/27/2014  . Degenerative arthritis of lumbar spine 08/27/2014  . Degenerative arthritis of hip 08/27/2014  . Arthritis, degenerative 03/28/2014  . Chest pain 03/14/2014  . Generalized weakness 03/14/2014  . Breath shortness 03/14/2014  . 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 01/11/2014  . Combined fat and carbohydrate induced hyperlipemia 01/11/2014  . Compulsive tobacco user syndrome 01/11/2014  . CAD (coronary artery disease) of artery bypass graft 01/11/2014    Orientation RESPIRATION BLADDER Height & Weight     Self,Time,Situation,Place  Normal Continent Weight: 86.2 kg Height:  '5\' 6"'  (167.6 cm)  BEHAVIORAL SYMPTOMS/MOOD NEUROLOGICAL BOWEL NUTRITION STATUS      Continent Diet  AMBULATORY STATUS COMMUNICATION OF NEEDS Skin   Extensive Assist Verbally Skin abrasions (top of hear, Cancer Tx)                       Personal Care Assistance Level of Assistance  Bathing,Feeding,Dressing Bathing Assistance: Limited assistance Feeding assistance: Independent Dressing Assistance: Limited assistance     Functional Limitations Info  Sight,Hearing,Speech Sight Info: Adequate (wears glasses) Hearing Info: Adequate Speech Info: Adequate    SPECIAL  CARE FACTORS FREQUENCY  OT (By licensed OT),PT (By licensed PT)     PT  Frequency: 5x week OT Frequency: 5x week            Contractures Contractures Info: Not present    Additional Factors Info  Code Status,Allergies Code Status Info: FULL Allergies Info: Zocor, Pravachol, Statins, Pravastatin           Current Medications (11/11/2020):  This is the current hospital active medication list Current Facility-Administered Medications  Medication Dose Route Frequency Provider Last Rate Last Admin  . 0.9 %  sodium chloride infusion   Intravenous PRN Ivor Costa, MD 10 mL/hr at 11/07/20 2345 500 mL at 11/07/20 2345  . acetaminophen (TYLENOL) tablet 650 mg  650 mg Oral Q6H PRN Ivor Costa, MD   650 mg at 11/10/20 0208  . albuterol (PROVENTIL) (2.5 MG/3ML) 0.083% nebulizer solution 2.5 mg  2.5 mg Nebulization Q4H PRN Ivor Costa, MD      . ALPRAZolam Duanne Moron) tablet 0.25 mg  0.25 mg Oral BID PRN Nicole Kindred A, DO   0.25 mg at 11/11/20 0842  . apixaban (ELIQUIS) tablet 5 mg  5 mg Oral BID Enzo Bi, MD   5 mg at 11/11/20 0834  . ascorbic acid (VITAMIN C) tablet 500 mg  500 mg Oral Daily Ivor Costa, MD   500 mg at 11/11/20 0834  . aspirin chewable tablet 81 mg  81 mg Oral Daily Ivor Costa, MD   81 mg at 11/11/20 0834  . azithromycin (ZITHROMAX) tablet 500 mg  500 mg Oral Daily Nicole Kindred A, DO   500 mg at 11/11/20 0834  . calcium-vitamin D (OSCAL WITH D) 500-200 MG-UNIT per tablet 1 tablet  1 tablet Oral BID Renda Rolls, RPH   1 tablet at 11/11/20 8756  . cefTRIAXone (ROCEPHIN) 2 g in sodium chloride 0.9 % 100 mL IVPB  2 g Intravenous Q24H Nicole Kindred A, DO   Stopping Infusion hung by another clincian at 11/11/20 0744  . chlorpheniramine-HYDROcodone (TUSSIONEX) 10-8 MG/5ML suspension 5 mL  5 mL Oral Q12H PRN Nicole Kindred A, DO      . dextromethorphan-guaiFENesin (MUCINEX DM) 30-600 MG per 12 hr tablet 1 tablet  1 tablet Oral BID Nicole Kindred A, DO   1 tablet at 11/11/20 0834  . guaiFENesin-dextromethorphan (ROBITUSSIN DM) 100-10 MG/5ML syrup 5 mL   5 mL Oral Q4H PRN Nicole Kindred A, DO   5 mL at 11/11/20 0842  . hydrALAZINE (APRESOLINE) injection 5 mg  5 mg Intravenous Q2H PRN Ivor Costa, MD      . insulin aspart (novoLOG) injection 0-5 Units  0-5 Units Subcutaneous QHS Ivor Costa, MD      . insulin aspart (novoLOG) injection 0-9 Units  0-9 Units Subcutaneous TID WC Ivor Costa, MD   2 Units at 11/10/20 1230  . ipratropium-albuterol (DUONEB) 0.5-2.5 (3) MG/3ML nebulizer solution 3 mL  3 mL Nebulization Q6H Ivor Costa, MD   3 mL at 11/11/20 0731  . magnesium sulfate IVPB 2 g 50 mL  2 g Intravenous Once Nicole Kindred A, DO      . ondansetron (ZOFRAN) injection 4 mg  4 mg Intravenous Q8H PRN Ivor Costa, MD      . oxyCODONE-acetaminophen (PERCOCET/ROXICET) 5-325 MG per tablet 1 tablet  1 tablet Oral Q4H PRN Ivor Costa, MD   1 tablet at 11/11/20 [phone]  . pantoprazole (PROTONIX) EC tablet 40 mg  40 mg Oral Daily Ivor Costa, MD  40 mg at 11/11/20 0834  . propranolol (INDERAL) tablet 10 mg  10 mg Oral BID Ivor Costa, MD   10 mg at 11/11/20 0761     Discharge Medications: Please see discharge summary for a list of discharge medications.  Relevant Imaging Results:  Relevant Lab Results:   Additional Information [id number]  [place], [place]

## 2020-11-11 NOTE — Progress Notes (Signed)
PROGRESS NOTE    [name].  NUU:[id number] DOB: Feb 25, 1946 DOA: 11/07/2020 PCP: Tracie Harrier, MD    Assessment & Plan:   Principal Problem:   HCAP (healthcare-associated pneumonia) Active Problems:   Multiple myeloma ([place])   Benign essential HTN   Type 2 diabetes mellitus without complication, without long-term current use of insulin (HCC)   CAD (coronary artery disease) of artery bypass graft   Squamous cell skin cancer   Severe sepsis (HCC)   HLD (hyperlipidemia)   GERD (gastroesophageal reflux disease)   Depression with anxiety   Chronic diastolic CHF (congestive heart failure) (HCC)   Elevated troponin   Atrial fibrillation with RVR ([place])   T7 vertebral fracture (HCC)   Aspiration pneumonia (HCC)    Massimo Hartland. is a 75 y.o. male with medical history significant of HTN, HLD, DM, MM, scalp skin CA on XRT, CAD, CABG, dCHF, presents with shortness of breath.   Acute respiratory failure with hypoxia    --CTA negative for PE, but showed right basilar opacity, consistent with pneumonia.   --Patient has oxygen desaturated to 88% on room air, which improved to 94% on 3 L oxygen in ED. Plan: --treat pneumonia as below --Continue supplemental O2 to keep sats >=92%, wean as tolerated  severe sepsis due to possible CAP vs. aspiration pneumonia:  --presented with tachycardia, tachypnea, elevated lactic acid 2.7, 2.9.  source PNA. - started on IV Vancomycin and cefepime, flagyl on admission --procal 0.21 on presentation >> 0.32  --Seen by speech therapy in no aspiration issue was seen Plan: --Continue Rocephin and Zithromax --Transition to PO antibiotic at d/c if course not completed (consider extend to 7 day course) --Mucinex twice daily with as needed Robitussin-DM --scheduled DuoNeb   New onset A fib with RVR:  Acquired thrombophilia  CHA2DS2-VASc Score is 6, needs anticoagulation.  V heparin started in ED. Dr. Clayborn Bigness of card is consulted. HR 126  -->90s now. -pt received 100 mg of Cardizem in the ED, since rate controlled --cardiology recommended short-term anticoagulation. Plan: --Continue home propranolol --Continue Eliquis, will continue for at least a month --Follow-up with cardiology outpatient  Elevated troponin 2/2 demand ischemia hx of CAD S/p of CABG.  trop 167 -->148.    Chest pain is musculoskeletal in nature and exacerbated by coughing. Elevated troponin due to due to demand ischemia in setting of pneumonia. Has allergies to several statins but confirmed with pharmacy and pt he takes and tolerates lovastatin. Plan: --cont ASA and lovastatin   T8 vertebral fracture [facility]):  consulted neurosurgeon, Dr. Johnney Killian saw pt.  Plan: -TLSO brace when out of bed --follow up with Neurosurgery  Multiple myeloma [facility]): Patient was on Revlimid, taken off few months ago by oncologist -f/u with oncology  Benign essential HTN -Continue propranolol.  Hold losartan due to soft BP.  Pt has been normotensive for most part past few days.  Type 2 diabetes mellitus without complication, without long-term current use of insulin ([place]):  Recent A1c 5.2, well controlled.    Hold Metformin. -Sliding scale insulin  Squamous cell skin cancer: On scalp  -Patient is treated with radiation therapy by dermatologist, going for treatment today (1/4)  HLD (hyperlipidemia) --cont Zetia (patient is allergic to several statins)  GERD (gastroesophageal reflux disease) -Protonix  Depression with anxiety: Patient used to be on Paxil, no longer taking --cont Xanax PRN -increase to twice daily as needed as patient with increased anxiety today 1/2   Chronic diastolic CHF (congestive heart failure) (  [place]):  2D echo on 03/17/2018 showed EF of 50%.  Patient has trace leg edema, no pulmonary edema chest x-ray. BNP was elevated 815, but pt clinically euvolemic  Monitor volume status closely.    DVT prophylaxis: ZO:XWRUEAV Code Status: Full code   Family Communication:  Status is: inpatient Inpatient status remains appropriate due to severity of illness requiring supplemental oxygen and IV therapies as above.  Expect medically stable for d/c in 24-48 hours.    Dispo:   The patient is from: home Anticipated d/c is to: home Anticipated d/c date is: 2 days pending SNF placement.    Subjective and Interval History:  Pt seen today before going to radiation therapy.  Says overall feeling better.  Asks about how much longer he'll be here.  He is agreeable to go to rehab as he lives alone and realizes he is weak.  Still some coughing but improved.  Back pain okay today.     Objective: Vitals:   11/11/20 0421 11/11/20 0748 11/11/20 1131 11/11/20 1514  BP: 130/74 (!) 144/69 128/66 126/62  Pulse: 61 (!) 59 63 (!) 51  Resp: _0 Temp: 98.3 F (36.8 C) 97.8 F (36.6 C) 98 F (36.7 C) 97.6 F (36.4 C)  TempSrc: Oral Oral Oral Oral  SpO2: 95% 94% 93% 96%  Weight:      Height:        Intake/Output Summary (Last 24 hours) at 11/11/2020 1724 Last data filed at 11/11/2020 1514 Gross per 24 hour  Intake 940 ml  Output 1250 ml  Net -310 ml   Filed Weights   11/08/20 0146 11/09/20 0337 11/11/20 0025  Weight: 83.8 kg 85.9 kg 86.2 kg    Examination:   Constitutional: Awake and alert, no acute distress CV: RRR, no peripheral edema RESP: CTAB with diminished bases, normal respiratory effort at rest, on 2 L/min O2. Extremities: Moves all, no cyanosis or edema  SKIN: Stable ulcerated lesion on top of the scalp, skin is otherwise dry and intact Neuro: Grossly nonfocal exam, normal speech Psych: Depressed mood and congruent affect.  Judgment and insight appear normal.   Data Reviewed: I have personally reviewed following labs and imaging studies  CBC: Recent Labs  Lab 11/06/20 2237 11/08/20 0524 11/09/20 0602  WBC 6.5 6.6 8.1  NEUTROABS 5.1  --   --   HGB 12.6* 10.9* 10.8*  HCT 37.7* 31.1* 31.9*  MCV 111.9* 109.1*  110.4*  PLT 137* 123* 409*   Basic Metabolic Panel: Recent Labs  Lab 11/06/20 2237 11/08/20 0524 11/09/20 0602 11/10/20 0600 11/11/20 0627  NA 140 132* 132* 135  --   K 3.9 4.0 4.4 4.4  --   CL 105 102 102 105  --   CO2 24 21* 22 25  --   GLUCOSE 119* 170* 124* 94  --   BUN 32* 43* 40* 30*  --   CREATININE 1.00 1.01 0.90 0.84  --   CALCIUM 9.0 8.2* 8.5* 8.5*  --   MG  --   --  2.1 1.9 1.7   GFR: Estimated Creatinine Clearance: 77 mL/min (by C-G formula based on SCr of 0.84 mg/dL). Liver Function Tests: No results for input(s): AST, ALT, ALKPHOS, BILITOT, PROT, ALBUMIN in the last 168 hours. No results for input(s): LIPASE, AMYLASE in the last 168 hours. No results for input(s): AMMONIA in the last 168 hours. Coagulation Profile: Recent Labs  Lab 11/07/20 0450  INR 1.4*   Cardiac Enzymes: No  results for input(s): CKTOTAL, CKMB, CKMBINDEX, TROPONINI in the last 168 hours. BNP (last 3 results) No results for input(s): PROBNP in the last 8760 hours. HbA1C: No results for input(s): HGBA1C in the last 72 hours. CBG: Recent Labs  Lab 11/10/20 1638 11/10/20 2059 11/11/20 0750 11/11/20 1132 11/11/20 1623  GLUCAP 92 121* 83 133* 146*   Lipid Profile: No results for input(s): CHOL, HDL, LDLCALC, TRIG, CHOLHDL, LDLDIRECT in the last 72 hours. Thyroid Function Tests: No results for input(s): TSH, T4TOTAL, FREET4, T3FREE, THYROIDAB in the last 72 hours. Anemia Panel: No results for input(s): VITAMINB12, FOLATE, FERRITIN, TIBC, IRON, RETICCTPCT in the last 72 hours. Sepsis Labs: Recent Labs  Lab 11/07/20 0235 11/07/20 0549 11/07/20 0729 11/07/20 1020 11/07/20 1248 11/08/20 0524  PROCALCITON 0.21  --   --   --   --  0.32  LATICACIDVEN  --  2.7* 2.9* 3.7* 3.6*  --     Recent Results (from the past 240 hour(s))  Resp Panel by RT-PCR (Flu A&B, Covid) Nasopharyngeal Swab     Status: None   Collection Time: 11/07/20  2:35 AM   Specimen: Nasopharyngeal Swab;  Nasopharyngeal(NP) swabs in vial transport medium  Result Value Ref Range Status   SARS Coronavirus 2 by RT PCR NEGATIVE NEGATIVE Final    Comment: (NOTE) SARS-CoV-2 target nucleic acids are NOT DETECTED.  The SARS-CoV-2 RNA is generally detectable in upper respiratory specimens during the acute phase of infection. The lowest concentration of SARS-CoV-2 viral copies this assay can detect is 138 copies/mL. A negative result does not preclude SARS-Cov-2 infection and should not be used as the sole basis for treatment or other patient management decisions. A negative result may occur with  improper specimen collection/handling, submission of specimen other than nasopharyngeal swab, presence of viral mutation(s) within the areas targeted by this assay, and inadequate number of viral copies(<138 copies/mL). A negative result must be combined with clinical observations, patient history, and epidemiological information. The expected result is Negative.  Fact Sheet for Patients:  EntrepreneurPulse.com.au  Fact Sheet for Healthcare Providers:  IncredibleEmployment.be  This test is no t yet approved or cleared by the [place] FDA and  has been authorized for detection and/or diagnosis of SARS-CoV-2 by FDA under an Emergency Use Authorization (EUA). This EUA will remain  in effect (meaning this test can be used) for the duration of the COVID-19 declaration under Section 564(b)(1) of the Act, 21 U.S.C.section 360bbb-3(b)(1), unless the authorization is terminated  or revoked sooner.       Influenza A by PCR NEGATIVE NEGATIVE Final   Influenza B by PCR NEGATIVE NEGATIVE Final    Comment: (NOTE) The Xpert Xpress SARS-CoV-2/FLU/RSV plus assay is intended as an aid in the diagnosis of influenza from Nasopharyngeal swab specimens and should not be used as a sole basis for treatment. Nasal washings and aspirates are unacceptable for Xpert Xpress  SARS-CoV-2/FLU/RSV testing.  Fact Sheet for Patients: EntrepreneurPulse.com.au  Fact Sheet for Healthcare Providers: IncredibleEmployment.be  This test is not yet approved or cleared by the [place] FDA and has been authorized for detection and/or diagnosis of SARS-CoV-2 by FDA under an Emergency Use Authorization (EUA). This EUA will remain in effect (meaning this test can be used) for the duration of the COVID-19 declaration under Section 564(b)(1) of the Act, 21 U.S.C. section 360bbb-3(b)(1), unless the authorization is terminated or revoked.  Performed at [facility], [address], [place], [place] [address]   Culture, blood (single)  Status: None (Preliminary result)   Collection Time: 11/07/20  5:50 AM   Specimen: BLOOD  Result Value Ref Range Status   Specimen Description BLOOD LFA  Final   Special Requests   Final    BOTTLES DRAWN AEROBIC AND ANAEROBIC Blood Culture adequate volume   Culture   Final    NO GROWTH 4 DAYS Performed at [facility], [address] [place], [place] [address]    Report Status PENDING  Incomplete  MRSA PCR Screening     Status: None   Collection Time: 11/07/20 12:39 PM   Specimen: Nasal Mucosa; Nasopharyngeal  Result Value Ref Range Status   MRSA by PCR NEGATIVE NEGATIVE Final    Comment:        The GeneXpert MRSA Assay (FDA approved for NASAL specimens only), is one component of a comprehensive MRSA colonization surveillance program. It is not intended to diagnose MRSA infection nor to guide or monitor treatment for MRSA infections. Performed at [facility], [address], [place], [place] [address]       Radiology Studies: ECHOCARDIOGRAM COMPLETE  Result Date: 11/11/2020    ECHOCARDIOGRAM REPORT   Patient Name:   Anthem Frazer. Date of Exam: 11/10/2020 Medical Rec #:  [id number]        Height:       66.0 in Accession #:    [id number]       Weight:        189.3 lb Date of Birth:  December 07, 1945       BSA:          1.954 m Patient Age:    34 years         BP:           134/62 mmHg Patient Gender: M                HR:           55 bpm. Exam Location:  [facility] Procedure: 2D Echo, Cardiac Doppler and Color Doppler Indications:     Atrial Fibrillation 148.91  History:         Patient has no prior history of Echocardiogram examinations.                  Previous Myocardial Infarction, Signs/Symptoms:Shortness of                  Breath; Risk Factors:Hypertension and Diabetes.  Sonographer:     Sherrie Sport RDCS (AE) Referring Phys:  Soledad Gerlach NIU Diagnosing Phys: Serafina Royals MD  Sonographer Comments: No apical window and Technically challenging study due to limited acoustic windows. IMPRESSIONS  1. Left ventricular ejection fraction, by estimation, is 45 to 50%. The left ventricle has mildly decreased function. The left ventricle demonstrates global hypokinesis. Left ventricular diastolic parameters were normal.  2. Right ventricular systolic function is normal. The right ventricular size is moderately enlarged.  3. Left atrial size was mildly dilated.  4. Right atrial size was mildly dilated.  5. The mitral valve is normal in structure. Mild to moderate mitral valve regurgitation.  6. Tricuspid valve regurgitation is mild to moderate.  7. The aortic valve is normal in structure. Aortic valve regurgitation is trivial. FINDINGS  Left Ventricle: Left ventricular ejection fraction, by estimation, is 45 to 50%. The left ventricle has mildly decreased function. The left ventricle demonstrates global hypokinesis. The left ventricular internal cavity size was normal in size. There is  no left ventricular hypertrophy. Left  ventricular diastolic parameters were normal. Right Ventricle: The right ventricular size is moderately enlarged. No increase in right ventricular wall thickness. Right ventricular systolic function is normal. Left Atrium: Left atrial size was mildly dilated. Right  Atrium: Right atrial size was mildly dilated. Pericardium: There is no evidence of pericardial effusion. Mitral Valve: The mitral valve is normal in structure. Mild to moderate mitral valve regurgitation. Tricuspid Valve: The tricuspid valve is normal in structure. Tricuspid valve regurgitation is mild to moderate. Aortic Valve: The aortic valve is normal in structure. Aortic valve regurgitation is trivial. Pulmonic Valve: The pulmonic valve was normal in structure. Pulmonic valve regurgitation is not visualized. Aorta: The aortic root and ascending aorta are structurally normal, with no evidence of dilitation. IAS/Shunts: No atrial level shunt detected by color flow Doppler.  LEFT VENTRICLE PLAX 2D LVIDd:         4.84 cm LVIDs:         3.57 cm LV PW:         0.96 cm LV IVS:        1.28 cm LVOT diam:     2.10 cm LVOT Area:     3.46 cm  LEFT ATRIUM         Index LA diam:    4.00 cm 2.05 cm/m                        PULMONIC VALVE AORTA                 PV Vmax:        0.69 m/s Ao Root diam: 3.60 cm PV Peak grad:   1.9 mmHg                       RVOT Peak grad: 3 mmHg   SHUNTS Systemic Diam: 2.10 cm Serafina Royals MD Electronically signed by Serafina Royals MD Signature Date/Time: 11/11/2020/8:18:56 AM    Final      Scheduled Meds: . apixaban  5 mg Oral BID  . ascorbic acid  500 mg Oral Daily  . aspirin  81 mg Oral Daily  . azithromycin  500 mg Oral Daily  . calcium-vitamin D  1 tablet Oral BID  . dextromethorphan-guaiFENesin  1 tablet Oral BID  . insulin aspart  0-5 Units Subcutaneous QHS  . insulin aspart  0-9 Units Subcutaneous TID WC  . ipratropium-albuterol  3 mL Nebulization Q6H  . pantoprazole  40 mg Oral Daily  . propranolol  10 mg Oral BID   Continuous Infusions: . sodium chloride 500 mL (11/07/20 2345)  . cefTRIAXone (ROCEPHIN)  IV Stopped (11/11/20 0744)     LOS: 4 days    Time spent: 25 minutes with greater than 50% spent at bedside and in coordination of care   Ezekiel Slocumb,  MD [facility] If 7PM-7AM, please contact night-coverage 11/11/2020, 5:24 PM

## 2020-11-12 ENCOUNTER — Ambulatory Visit: Payer: Medicare Other

## 2020-11-12 DIAGNOSIS — J189 Pneumonia, unspecified organism: Secondary | ICD-10-CM | POA: Diagnosis not present

## 2020-11-12 LAB — BASIC METABOLIC PANEL
Anion gap: 7 (ref 5–15)
BUN: 15 mg/dL (ref 8–23)
CO2: 28 mmol/L (ref 22–32)
Calcium: 8.6 mg/dL — ABNORMAL LOW (ref 8.9–10.3)
Chloride: 100 mmol/L (ref 98–111)
Creatinine, Ser: 0.68 mg/dL (ref 0.61–1.24)
GFR, Estimated: >60 mL/min (ref >60)
Glucose, Bld: 117 mg/dL — ABNORMAL HIGH (ref 70–99)
Potassium: 4.3 mmol/L (ref 3.5–5.1)
Sodium: 135 mmol/L (ref 135–145)

## 2020-11-12 LAB — GLUCOSE, CAPILLARY
Glucose-Capillary: 91 mg/dL (ref 70–99)
Glucose-Capillary: 166 mg/dL — ABNORMAL HIGH (ref 70–99)
Glucose-Capillary: 75 mg/dL (ref 70–99)
Glucose-Capillary: 185 mg/dL — ABNORMAL HIGH (ref 70–99)

## 2020-11-12 LAB — CBC
HCT: 33.1 % — ABNORMAL LOW (ref 39.0–52.0)
Hemoglobin: 11.1 g/dL — ABNORMAL LOW (ref 13.0–17.0)
MCH: 37.4 pg — ABNORMAL HIGH (ref 26.0–34.0)
MCHC: 33.5 g/dL (ref 30.0–36.0)
MCV: 111.4 fL — ABNORMAL HIGH (ref 80.0–100.0)
Platelets: 145 10*3/uL — ABNORMAL LOW (ref 150–400)
RBC: 2.97 MIL/uL — ABNORMAL LOW (ref 4.22–5.81)
RDW: 12.9 % (ref 11.5–15.5)
WBC: 5.4 10*3/uL (ref 4.0–10.5)
nRBC: 0 % (ref 0.0–0.2)

## 2020-11-12 LAB — MAGNESIUM: Magnesium: 1.8 mg/dL (ref 1.7–2.4)

## 2020-11-12 LAB — CULTURE, BLOOD (SINGLE)
Culture: NO GROWTH
Special Requests: ADEQUATE

## 2020-11-12 LAB — RESP PANEL BY RT-PCR (FLU A&B, COVID) ARPGX2
Influenza A by PCR: NEGATIVE
Influenza B by PCR: NEGATIVE
SARS Coronavirus 2 by RT PCR: NEGATIVE

## 2020-11-12 MED ORDER — FUROSEMIDE 10 MG/ML IJ SOLN
40.0000 mg | Freq: Once | INTRAMUSCULAR | Status: AC
  Administered 2020-11-12: 40 mg via INTRAVENOUS
  Filled 2020-11-12: qty 4

## 2020-11-12 MED ORDER — IPRATROPIUM-ALBUTEROL 0.5-2.5 (3) MG/3ML IN SOLN
3.0000 mL | Freq: Two times a day (BID) | RESPIRATORY_TRACT | Status: DC
  Administered 2020-11-13: 3 mL via RESPIRATORY_TRACT
  Filled 2020-11-12: qty 3

## 2020-11-12 MED ORDER — APIXABAN 5 MG PO TABS: 5.0000 mg | ORAL_TABLET | Freq: Two times a day (BID) | ORAL | Status: DC

## 2020-11-12 NOTE — Progress Notes (Signed)
Physical Therapy Treatment Patient Details Name: Luis [name]. MRN: [id number] DOB: [birth year] Today's Date: 11/12/2020    History of Present Illness Luis [name]. is a 75 y.o. male with medical history significant of HTN, HLD, DM, MM, scalp skin CA on XRT, CAD, CABG, dCHF, presented to hospital ED on 11/07/20 with shortness of breath. Patient admitted for acute respiratory failure with hypoxia and severe sepsis due to possible HCAP vs. aspiration pneumonia, new onset A fib with RVR, elevated troponin (determined to be demand ischemia), T7 vertebral fracture (requires TLSO when out of bed), multiple myeloma, chronic diatolic CHF.  CTA negative for PE, but showed right basilar opacity. Consults from cardiology and neurosurgery (concern for unstable T7 fracture - reccomended TLSO when out of bed).    PT Comments    Pt in bed upon entry, son in room. Pt initially not up for doing much, as he had a restless night and is feeling quite fatigued this date. Pt later changes his tune and is open to trying some basic mobility training. Educated on avoidance of sagittal plane trunk flexion to protect his T-spine levels. Attempted to teach log-roll, largely unsuccessful. Pt requires elevated height for coming to standing without assistance, upgraded from [facility] to RW for more symmetrical UE use in transfers and avoidance of thoracic rotation. Pt able to AMB some in room on O2 but does desaturate with minimal activity. Pt remain brady most of session. Pt left up in chair to facilitate linen change of bed.   Follow Up Recommendations  SNF     Equipment Recommendations  3in1 (PT);Rolling walker with 5" wheels    Recommendations for Other Services OT consult     Precautions / Restrictions Precautions Precautions: Fall Precaution Booklet Issued: No Required Braces or Orthoses: Spinal Brace Spinal Brace: Thoracolumbosacral orthotic    Mobility  Bed Mobility Overal bed mobility: Needs Assistance Bed  Mobility: Supine to Sit     Supine to sit: Mod assist     General bed mobility comments: educated pt on avoiding flexion for OOB, however, not following commands for log roll, nor tolerating flat HOB for true log roll, and truth be told, typically sleeps in a lift chair at night PTA.  Transfers Overall transfer level: Needs assistance Equipment used: Straight cane Transfers: Sit to/from [employer] Transfers Sit to Stand: Min assist         General transfer comment: minA from elevated surface or supervision from muchhigher elevated surface  Ambulation/Gait Ambulation/Gait assistance: Supervision Gait Distance (Feet): 30 Feet Assistive device: Rolling walker (2 wheeled) Gait Pattern/deviations: Step-to pattern Gait velocity: very slow   General Gait Details: Alternating FWD/retro [name] RW, on O2 drops to 86% SpO2.   Stairs             Wheelchair Mobility    Modified Rankin (Stroke Patients Only)       Balance Overall balance assessment: Modified Independent;Mild deficits observed, not formally tested                                          Cognition Arousal/Alertness: Awake/alert Behavior During Therapy: WFL for tasks assessed/performed Overall Cognitive Status: Within Functional Limits for tasks assessed  Exercises Other Exercises Other Exercises: STS from EOBx3 Other Exercises: TLSO education/adjustment    General Comments        Pertinent Vitals/Pain Pain Assessment: 0-10 Pain Score: 6  Pain Location: Back, flanks Pain Intervention(s): Limited activity within patient's tolerance;Monitored during session;Premedicated before session;Repositioned    Home Living                      Prior Function            PT Goals (current goals can now be found in the care plan section) Acute Rehab PT Goals Patient Stated Goal: get stronger PT Goal Formulation: With  patient Time For Goal Achievement: 11/24/20 Potential to Achieve Goals: Good Progress towards PT goals: Progressing toward goals    Frequency    Min 2X/week      PT Plan Current plan remains appropriate    Co-evaluation              AM-PAC PT "6 Clicks" Mobility   Outcome Measure  Help needed turning from your back to your side while in a flat bed without using bedrails?: A Little Help needed moving from lying on your back to sitting on the side of a flat bed without using bedrails?: A Little Help needed moving to and from a bed to a chair (including a wheelchair)?: A Little Help needed standing up from a chair using your arms (e.g., wheelchair or bedside chair)?: A Little Help needed to walk in hospital room?: A Little Help needed climbing 3-5 steps with a railing? : A Lot 6 Click Score: 17    End of Session Equipment Utilized During Treatment: Gait belt;Back brace;Oxygen Activity Tolerance: Patient tolerated treatment well;Patient limited by fatigue Patient left: in chair;with call bell/phone within reach;with chair alarm set;with family/visitor present Nurse Communication: Mobility status PT Visit Diagnosis: Unsteadiness on feet (R26.81);Difficulty in walking, not elsewhere classified (R26.2);Repeated falls (R29.6);Muscle weakness (generalized) (M62.81)     Time: 6759-1638 PT Time Calculation (min) (ACUTE ONLY): 33 min  Charges:  $Therapeutic Exercise: 23-37 mins                     5:52 PM, 11/12/20 Etta Grandchild, PT, DPT Physical Therapist - [facility]  [phone] ([place])    Luis [name] 11/12/2020, 5:48 PM

## 2020-11-12 NOTE — Plan of Care (Signed)

## 2020-11-12 NOTE — Progress Notes (Signed)
Mobility Specialist - Progress Note   11/12/20 1300  Mobility  Activity Contraindicated/medical hold  Mobility performed by Mobility specialist    Per discussion with nurse, pt currently with critically low HR of 37 bpm, suggesting to hold mobility session at this time. Will continue to monitor and attempt session when appropriate.    Filiberto Pinks Mobility Specialist 11/12/20, 1:10 PM

## 2020-11-12 NOTE — Progress Notes (Signed)
PROGRESS NOTE    Luis [name].  ZYS:[id number] DOB: 03-02-1946 DOA: 11/07/2020 PCP: Tracie Harrier, MD    Assessment & Plan:   Principal Problem:   HCAP (healthcare-associated pneumonia) Active Problems:   Multiple myeloma ([place])   Benign essential HTN   Type 2 diabetes mellitus without complication, without long-term current use of insulin (HCC)   CAD (coronary artery disease) of artery bypass graft   Squamous cell skin cancer   Severe sepsis (HCC)   HLD (hyperlipidemia)   GERD (gastroesophageal reflux disease)   Depression with anxiety   Chronic diastolic CHF (congestive heart failure) (HCC)   Elevated troponin   Atrial fibrillation with RVR ([place])   T7 vertebral fracture (HCC)   Aspiration pneumonia (HCC)    Luis [name]. is a 75 y.o. male with medical history significant of HTN, HLD, DM, MM, scalp skin CA on XRT, CAD, CABG, dCHF, presents with shortness of breath.   Acute respiratory failure with hypoxia    --CTA negative for PE, but showed right basilar opacity, consistent with pneumonia.   --Patient has oxygen desaturated to 88% on room air, which improved to 94% on 3 L oxygen in ED. --finished PNA treatment --currently still needs 2L while at rest Plan: --treat pneumonia as below --Continue supplemental O2 to keep sats >=92%, wean as tolerated --will trial diuresis with IV lasix 40 mg x1    severe sepsis due to possible CAP vs. aspiration pneumonia:  --presented with tachycardia, tachypnea, elevated lactic acid 2.7, 2.9.  source PNA. - started on IV Vancomycin and cefepime, flagyl on admission --procal 0.21 on presentation >> 0.32  --Seen by speech therapy in no aspiration issue was seen --already had 2 days of cefepime and 4 days of ceftriaxone, and 5 days of azithromycin Plan: --d/c abx --Mucinex twice daily with as needed Robitussin-DM --scheduled DuoNeb  --will discharge on 2L O2  New onset A fib with RVR:  Acquired thrombophilia   CHA2DS2-VASc Score is 6, needs anticoagulation.  V heparin started in ED. Dr. Clayborn Bigness of card is consulted. HR 126 -->90s now. -pt received 100 mg of Cardizem in the ED, since rate controlled --cardiology recommended short-term anticoagulation. Plan: --d/c home propranolol due to low HR down to 30's --Continue Eliquis, will continue for at least a month --Follow-up with cardiology outpatient  Elevated troponin 2/2 demand ischemia hx of CAD S/p of CABG.  trop 167 -->148.    Chest pain is musculoskeletal in nature and exacerbated by coughing. Elevated troponin due to due to demand ischemia in setting of pneumonia. Has allergies to several statins but confirmed with pharmacy and pt he takes and tolerates lovastatin. Plan: --cont ASA and lovastatin   T8 vertebral fracture [facility]):  consulted neurosurgeon, Dr. Johnney Killian saw pt.  Plan: -TLSO brace when out of bed --follow up with Neurosurgery  Multiple myeloma [facility]): Patient was on Revlimid, taken off few months ago by oncologist -f/u with oncology  Benign essential HTN  Pt has been normotensive for most part past few days. --Hold losartan due to intermittent soft BP  Type 2 diabetes mellitus without complication, without long-term current use of insulin ([place]):  Recent A1c 5.2, well controlled.     --Hold home Metformin while inpatient -Sliding scale insulin  Squamous cell skin cancer: On scalp  -Patient is treated with radiation therapy by dermatologist  HLD (hyperlipidemia)  --cont Zetia (patient is allergic to several statins)  GERD (gastroesophageal reflux disease) -Protonix  Depression with anxiety: Patient used to be  on Paxil, no longer taking --cont Xanax PRN -increase to twice daily as needed as patient with increased anxiety 1/2   Chronic diastolic CHF (congestive heart failure) ([place]):  2D echo on 03/17/2018 showed EF of 50%.  Patient has trace leg edema, no pulmonary edema chest x-ray. BNP was elevated 815,  but pt clinically euvolemic   --trial IV lasix 40 mg x1 today due to persistent hypoxia  Mild hematuria --Hgb stable, urine light pink.  Maybe due to being on Eliquis --monitor for now    DVT prophylaxis: JI:RCVELFY Code Status: Full code  Family Communication: son updated at bedside today Status is: inpatient  Dispo:   The patient is from: home Anticipated d/c is to: SNF Anticipated d/c date is: tomorrow   Subjective and Interval History:  Pt complained of being very tired today, however, per son, pt always complains of being tired.  Also complained of back pain.  Still coughing but improved.  Eating better. Reported good urine output.   Objective: Vitals:   11/12/20 1407 11/12/20 1413 11/12/20 1558 11/12/20 1701  BP:   136/65 136/67  Pulse:   (!) 52 (!) 50  Resp:   17 16  Temp:   97.6 F (36.4 C) 97.7 F (36.5 C)  TempSrc:   Oral Oral  SpO2: 95% 90% 90% 96%  Weight:      Height:        Intake/Output Summary (Last 24 hours) at 11/12/2020 1806 Last data filed at 11/12/2020 1014 Gross per 24 hour  Intake 824 ml  Output 995 ml  Net -171 ml   Filed Weights   11/09/20 0337 11/11/20 0025 11/12/20 0517  Weight: 85.9 kg 86.2 kg 82.9 kg    Examination:   Constitutional: NAD, AAOx3 HEENT: conjunctivae and lids normal, EOMI CV: No cyanosis.   RESP: clear, normal respiratory effort, on 2L Extremities: No effusions, edema in BLE SKIN: warm, dry and intact Neuro: II - XII grossly intact.   Psych: Normal mood and affect.  Appropriate judgement and reason Urological:  Urine pink   Data Reviewed: I have personally reviewed following labs and imaging studies  CBC: Recent Labs  Lab 11/06/20 2237 11/08/20 0524 11/09/20 0602 11/12/20 0438  WBC 6.5 6.6 8.1 5.4  NEUTROABS 5.1  --   --   --   HGB 12.6* 10.9* 10.8* 11.1*  HCT 37.7* 31.1* 31.9* 33.1*  MCV 111.9* 109.1* 110.4* 111.4*  PLT 137* 123* 134* 101*   Basic Metabolic Panel: Recent Labs  Lab 11/06/20 2237  11/08/20 0524 11/09/20 0602 11/10/20 0600 11/11/20 0627 11/12/20 0438  NA 140 132* 132* 135  --  135  K 3.9 4.0 4.4 4.4  --  4.3  CL 105 102 102 105  --  100  CO2 24 21* 22 25  --  28  GLUCOSE 119* 170* 124* 94  --  117*  BUN 32* 43* 40* 30*  --  15  CREATININE 1.00 1.01 0.90 0.84  --  0.68  CALCIUM 9.0 8.2* 8.5* 8.5*  --  8.6*  MG  --   --  2.1 1.9 1.7 1.8   GFR: Estimated Creatinine Clearance: 79.3 mL/min (by C-G formula based on SCr of 0.68 mg/dL). Liver Function Tests: No results for input(s): AST, ALT, ALKPHOS, BILITOT, PROT, ALBUMIN in the last 168 hours. No results for input(s): LIPASE, AMYLASE in the last 168 hours. No results for input(s): AMMONIA in the last 168 hours. Coagulation Profile: Recent Labs  Lab 11/07/20 0450  INR 1.4*   Cardiac Enzymes: No results for input(s): CKTOTAL, CKMB, CKMBINDEX, TROPONINI in the last 168 hours. BNP (last 3 results) No results for input(s): PROBNP in the last 8760 hours. HbA1C: No results for input(s): HGBA1C in the last 72 hours. CBG: Recent Labs  Lab 11/11/20 1623 11/11/20 2035 11/12/20 0732 11/12/20 1207 11/12/20 1701  GLUCAP 146* 148* 91 166* 75   Lipid Profile: No results for input(s): CHOL, HDL, LDLCALC, TRIG, CHOLHDL, LDLDIRECT in the last 72 hours. Thyroid Function Tests: No results for input(s): TSH, T4TOTAL, FREET4, T3FREE, THYROIDAB in the last 72 hours. Anemia Panel: No results for input(s): VITAMINB12, FOLATE, FERRITIN, TIBC, IRON, RETICCTPCT in the last 72 hours. Sepsis Labs: Recent Labs  Lab 11/07/20 0235 11/07/20 0549 11/07/20 0729 11/07/20 1020 11/07/20 1248 11/08/20 0524  PROCALCITON 0.21  --   --   --   --  0.32  LATICACIDVEN  --  2.7* 2.9* 3.7* 3.6*  --     Recent Results (from the past 240 hour(s))  Resp Panel by RT-PCR (Flu A&B, Covid) Nasopharyngeal Swab     Status: None   Collection Time: 11/07/20  2:35 AM   Specimen: Nasopharyngeal Swab; Nasopharyngeal(NP) swabs in vial transport  medium  Result Value Ref Range Status   SARS Coronavirus 2 by RT PCR NEGATIVE NEGATIVE Final    Comment: (NOTE) SARS-CoV-2 target nucleic acids are NOT DETECTED.  The SARS-CoV-2 RNA is generally detectable in upper respiratory specimens during the acute phase of infection. The lowest concentration of SARS-CoV-2 viral copies this assay can detect is 138 copies/mL. A negative result does not preclude SARS-Cov-2 infection and should not be used as the sole basis for treatment or other patient management decisions. A negative result may occur with  improper specimen collection/handling, submission of specimen other than nasopharyngeal swab, presence of viral mutation(s) within the areas targeted by this assay, and inadequate number of viral copies(<138 copies/mL). A negative result must be combined with clinical observations, patient history, and epidemiological information. The expected result is Negative.  Fact Sheet for Patients:  EntrepreneurPulse.com.au  Fact Sheet for Healthcare Providers:  IncredibleEmployment.be  This test is no t yet approved or cleared by the [place] FDA and  has been authorized for detection and/or diagnosis of SARS-CoV-2 by FDA under an Emergency Use Authorization (EUA). This EUA will remain  in effect (meaning this test can be used) for the duration of the COVID-19 declaration under Section 564(b)(1) of the Act, 21 U.S.C.section 360bbb-3(b)(1), unless the authorization is terminated  or revoked sooner.       Influenza A by PCR NEGATIVE NEGATIVE Final   Influenza B by PCR NEGATIVE NEGATIVE Final    Comment: (NOTE) The Xpert Xpress SARS-CoV-2/FLU/RSV plus assay is intended as an aid in the diagnosis of influenza from Nasopharyngeal swab specimens and should not be used as a sole basis for treatment. Nasal washings and aspirates are unacceptable for Xpert Xpress SARS-CoV-2/FLU/RSV testing.  Fact Sheet for  Patients: EntrepreneurPulse.com.au  Fact Sheet for Healthcare Providers: IncredibleEmployment.be  This test is not yet approved or cleared by the [place] FDA and has been authorized for detection and/or diagnosis of SARS-CoV-2 by FDA under an Emergency Use Authorization (EUA). This EUA will remain in effect (meaning this test can be used) for the duration of the COVID-19 declaration under Section 564(b)(1) of the Act, 21 U.S.C. section 360bbb-3(b)(1), unless the authorization is terminated or revoked.  Performed at [facility], [address] [place], [place] [address]  Culture, blood (single)     Status: None   Collection Time: 11/07/20  5:50 AM   Specimen: BLOOD  Result Value Ref Range Status   Specimen Description BLOOD LFA  Final   Special Requests   Final    BOTTLES DRAWN AEROBIC AND ANAEROBIC Blood Culture adequate volume   Culture   Final    NO GROWTH 5 DAYS Performed at [facility], [address] [place], [place] [address]    Report Status 11/12/2020 FINAL  Final  MRSA PCR Screening     Status: None   Collection Time: 11/07/20 12:39 PM   Specimen: Nasal Mucosa; Nasopharyngeal  Result Value Ref Range Status   MRSA by PCR NEGATIVE NEGATIVE Final    Comment:        The GeneXpert MRSA Assay (FDA approved for NASAL specimens only), is one component of a comprehensive MRSA colonization surveillance program. It is not intended to diagnose MRSA infection nor to guide or monitor treatment for MRSA infections. Performed at [facility], [address] [place], [place] [address]   Resp Panel by RT-PCR (Flu A&B, Covid) Nasopharyngeal Swab     Status: None   Collection Time: 11/12/20 12:20 PM   Specimen: Nasopharyngeal Swab; Nasopharyngeal(NP) swabs in vial transport medium  Result Value Ref Range Status   SARS Coronavirus 2 by RT PCR NEGATIVE NEGATIVE Final    Comment: (NOTE) SARS-CoV-2  target nucleic acids are NOT DETECTED.  The SARS-CoV-2 RNA is generally detectable in upper respiratory specimens during the acute phase of infection. The lowest concentration of SARS-CoV-2 viral copies this assay can detect is 138 copies/mL. A negative result does not preclude SARS-Cov-2 infection and should not be used as the sole basis for treatment or other patient management decisions. A negative result may occur with  improper specimen collection/handling, submission of specimen other than nasopharyngeal swab, presence of viral mutation(s) within the areas targeted by this assay, and inadequate number of viral copies(<138 copies/mL). A negative result must be combined with clinical observations, patient history, and epidemiological information. The expected result is Negative.  Fact Sheet for Patients:  EntrepreneurPulse.com.au  Fact Sheet for Healthcare Providers:  IncredibleEmployment.be  This test is no t yet approved or cleared by the [place] FDA and  has been authorized for detection and/or diagnosis of SARS-CoV-2 by FDA under an Emergency Use Authorization (EUA). This EUA will remain  in effect (meaning this test can be used) for the duration of the COVID-19 declaration under Section 564(b)(1) of the Act, 21 U.S.C.section 360bbb-3(b)(1), unless the authorization is terminated  or revoked sooner.       Influenza A by PCR NEGATIVE NEGATIVE Final   Influenza B by PCR NEGATIVE NEGATIVE Final    Comment: (NOTE) The Xpert Xpress SARS-CoV-2/FLU/RSV plus assay is intended as an aid in the diagnosis of influenza from Nasopharyngeal swab specimens and should not be used as a sole basis for treatment. Nasal washings and aspirates are unacceptable for Xpert Xpress SARS-CoV-2/FLU/RSV testing.  Fact Sheet for Patients: EntrepreneurPulse.com.au  Fact Sheet for Healthcare  Providers: IncredibleEmployment.be  This test is not yet approved or cleared by the [place] FDA and has been authorized for detection and/or diagnosis of SARS-CoV-2 by FDA under an Emergency Use Authorization (EUA). This EUA will remain in effect (meaning this test can be used) for the duration of the COVID-19 declaration under Section 564(b)(1) of the Act, 21 U.S.C. section 360bbb-3(b)(1), unless the authorization is terminated or revoked.  Performed at  [facility], [address] [place], [place] [address]       Radiology Studies: No results found.   Scheduled Meds: . apixaban  5 mg Oral BID  . ascorbic acid  500 mg Oral Daily  . aspirin  81 mg Oral Daily  . calcium-vitamin D  1 tablet Oral BID  . dextromethorphan-guaiFENesin  1 tablet Oral BID  . insulin aspart  0-5 Units Subcutaneous QHS  . insulin aspart  0-9 Units Subcutaneous TID WC  . ipratropium-albuterol  3 mL Nebulization Q6H  . pantoprazole  40 mg Oral Daily   Continuous Infusions: . sodium chloride Stopped (11/11/20 2233)     LOS: 5 days    Enzo Bi, MD [facility] If 7PM-7AM, please contact night-coverage 11/12/2020, 6:06 PM

## 2020-11-13 ENCOUNTER — Ambulatory Visit: Payer: Medicare Other

## 2020-11-13 ENCOUNTER — Inpatient Hospital Stay: Payer: Medicare Other | Attending: Hematology and Oncology | Admitting: Hematology and Oncology

## 2020-11-13 ENCOUNTER — Inpatient Hospital Stay: Payer: Medicare Other

## 2020-11-13 ENCOUNTER — Telehealth: Payer: Self-pay | Admitting: Hematology and Oncology

## 2020-11-13 DIAGNOSIS — J189 Pneumonia, unspecified organism: Secondary | ICD-10-CM | POA: Diagnosis not present

## 2020-11-13 LAB — BASIC METABOLIC PANEL
Anion gap: 10 (ref 5–15)
BUN: 13 mg/dL (ref 8–23)
CO2: 30 mmol/L (ref 22–32)
Calcium: 8.8 mg/dL — ABNORMAL LOW (ref 8.9–10.3)
Chloride: 95 mmol/L — ABNORMAL LOW (ref 98–111)
Creatinine, Ser: 0.67 mg/dL (ref 0.61–1.24)
GFR, Estimated: >60 mL/min (ref >60)
Glucose, Bld: 81 mg/dL (ref 70–99)
Potassium: 3.9 mmol/L (ref 3.5–5.1)
Sodium: 135 mmol/L (ref 135–145)

## 2020-11-13 LAB — MAGNESIUM: Magnesium: 1.6 mg/dL — ABNORMAL LOW (ref 1.7–2.4)

## 2020-11-13 LAB — CBC
HCT: 33.2 % — ABNORMAL LOW (ref 39.0–52.0)
Hemoglobin: 11.5 g/dL — ABNORMAL LOW (ref 13.0–17.0)
MCH: 37.7 pg — ABNORMAL HIGH (ref 26.0–34.0)
MCHC: 34.6 g/dL (ref 30.0–36.0)
MCV: 108.9 fL — ABNORMAL HIGH (ref 80.0–100.0)
Platelets: 147 10*3/uL — ABNORMAL LOW (ref 150–400)
RBC: 3.05 MIL/uL — ABNORMAL LOW (ref 4.22–5.81)
RDW: 12.8 % (ref 11.5–15.5)
WBC: 5.1 10*3/uL (ref 4.0–10.5)
nRBC: 0 % (ref 0.0–0.2)

## 2020-11-13 LAB — GLUCOSE, CAPILLARY
Glucose-Capillary: 88 mg/dL (ref 70–99)
Glucose-Capillary: 184 mg/dL — ABNORMAL HIGH (ref 70–99)
Glucose-Capillary: 147 mg/dL — ABNORMAL HIGH (ref 70–99)

## 2020-11-13 MED ORDER — FUROSEMIDE 10 MG/ML IJ SOLN: 40.0000 mg | Freq: Once | INTRAMUSCULAR | Status: DC

## 2020-11-13 MED ORDER — FUROSEMIDE 10 MG/ML IJ SOLN
40.0000 mg | Freq: Once | INTRAMUSCULAR | Status: AC
  Administered 2020-11-13: 40 mg via INTRAVENOUS
  Filled 2020-11-13: qty 4

## 2020-11-13 MED ORDER — FUROSEMIDE 40 MG PO TABS: 40.0000 mg | ORAL_TABLET | Freq: Every day | ORAL | Status: DC

## 2020-11-13 MED ORDER — IPRATROPIUM-ALBUTEROL 0.5-2.5 (3) MG/3ML IN SOLN: 3.0000 mL | Freq: Four times a day (QID) | RESPIRATORY_TRACT | Status: AC | PRN

## 2020-11-13 MED ORDER — MAGNESIUM SULFATE 2 GM/50ML IV SOLN
2.0000 g | Freq: Once | INTRAVENOUS | Status: AC
  Administered 2020-11-13: 2 g via INTRAVENOUS
  Filled 2020-11-13: qty 50

## 2020-11-13 MED ORDER — PROPRANOLOL HCL 10 MG PO TABS: ORAL_TABLET | ORAL | Status: DC

## 2020-11-13 NOTE — Telephone Encounter (Signed)
Returned patients call that he was admitted to [facility] w/ Pnemonia and could not make his appt today. I left a VM to call back to reschedule.

## 2020-11-13 NOTE — Care Management Important Message (Signed)
Important Message  Patient Details  Name: Luis [name]. MRN: [id number] Date of Birth: 1946-06-24   Medicare Important Message Given:  Yes     [name] 11/13/2020, 12:58 PM

## 2020-11-13 NOTE — TOC Transition Note (Addendum)
Transition of Care [facility]) - CM/SW Discharge Note   Patient Details  Name: Luis [name]. MRN: [id number] Date of Birth: February 17, 1946  [facility] CM/SW Contact:  Maree Krabbe, LCSW Phone Number: 11/13/2020, 12:15 PM   Clinical Narrative:   Clinical Social Worker facilitated patient discharge including contacting patient family and facility to confirm patient discharge plans.  Clinical information faxed to facility and family agreeable with plan.  CSW arranged ambulance transport via [facility] (3:15) to [place] in [place].  RN to call [phone] or [phone] for report prior to discharge.      Final next level of care: Skilled Nursing Facility Barriers to Discharge: No Barriers Identified   Patient Goals and CMS Choice        Discharge Placement              Patient chooses bed at:  [facility] in [place]) Patient to be transferred to facility by: [facility] Name of family member notified: son Patient and family notified of of transfer: 11/13/20  Discharge Plan and Services                                     Social Determinants of Health (SDOH) Interventions     Readmission Risk Interventions No flowsheet data found.

## 2020-11-13 NOTE — TOC Progression Note (Signed)
Transition of Care (TOC) - Progression Note    Patient Details  Name: Luis [name]. MRN: [id number] Date of Birth: 04-20-1946  [facility] CM/SW Contact  Maree Krabbe, LCSW Phone Number: 11/13/2020, 11:23 AM  Clinical Narrative:   CSW spoke with Idalia Needle at [facility] and she just needs summary and for pt to be at facility before 4:30. MD notified.         Expected Discharge Plan and Services           Expected Discharge Date: 11/13/20                                     Social Determinants of Health (SDOH) Interventions    Readmission Risk Interventions No flowsheet data found.

## 2020-11-13 NOTE — Discharge Summary (Signed)
Physician Discharge Summary   Luis [name]  male DOB: October 30, 1946  BJY:[id number]  PCP: Tracie Harrier, MD  Admit date: 11/07/2020 Discharge date: 11/13/2020  Admitted From: home Disposition:  SNF CODE STATUS: Full code   Hospital Course:  For full details, please see H&P, progress notes, consult notes and ancillary notes.  Briefly,  Luis Hughesis a 75 y.o.malewith medical history significant ofHTN, HLD, DM, MM,scalpskin CA on XRT, CAD, CABG, dCHF, presented with shortness of breath.   Acute respiratory failure with hypoxia  CTA negative for PE, but showed right basilar opacity, consistent with pneumonia. Patient had oxygen desaturated to 88% on room air, which improved to 94% on 3 L oxygenin ED. Pt finished PNA treatment, however, still needed 2L at rest prior to discharge.  Pt is discharged with 2L supplemental oxygen.  severe sepsis due to pneumonia presented with tachycardia, tachypnea, elevated lactic acid 2.7, 2.9.source PNA. Pt was started on IV Vancomycin and cefepime,flagyl on admission and finished 2 days of cefepime and 4 days of ceftriaxone, and 5 days of azithromycin.  Seen by speech therapy in no aspiration issue was seen.  Pt had coughing, so received Mucinex twice daily with as needed Robitussin-DM and scheduled DuoNeb.  New onset A fib with RVR: Acquired thrombophilia  CHA2DS2-VASc Scoreis 6.  Cardiology consulted.  Ptreceived 100 mg of Cardizem in the ED, since rate controlled.  cardiology recommended short-term anticoagulation, so pt was started on Eliquis, however, pt was developing mild hematuria, so Eliquis was d/c'ed prior to discharge.  Home propranolol also held due to low HR down to 30's.  Pt will follow up with cardiology outpatient  Elevated troponin2/2 demand ischemia hx of CAD S/p of CABG.  trop 167 -->148.  Chest pain is musculoskeletal in nature and exacerbated by coughing.  Continued ASA and statin.  T8 vertebral  fracture [facility]): consulted neurosurgeon, Dr. Johnney Killian saw pt.  Recommended TLSO brace when out of bed.  Multiple myeloma [facility]):  Patient was on Revlimid, taken off few months ago by oncologist.  Continue outpatient followup with oncology.  Benign essential HTN  Pt has been normotensive for most part past few days.  Home losartan held due to intermittent soft BP during hospitalization.  Resumed at discharge.  Type 2 diabetes mellitus without complication, without long-term current use of insulin ([place]): Recent A1c 5.2, well controlled.  Home metformin held while inpatient, resumed at discharge.   Squamous cell skin cancer:On scalp  Continue radiation therapy by dermatologist  HLD (hyperlipidemia)  Continued home Zetia.  GERD (gastroesophageal reflux disease)  Continued home PPI.  Depression with anxiety:  Patient used to be on Paxil, no longer taking.  Continued home Xanax PRN.  Chronic diastolic CHF (congestive heart failure) ([place]): 2D echo on 03/17/2018 showed EF of 50%. Patient has trace leg edema, no pulmonary edema chest x-ray.BNP was elevated 815, but pt clinically euvolemic per admitting physician.  No diuretic on home meds.  However, due to persistent O2 requirement, pt received IV lasix 40 mg x2 prior to discharge.  Pt will take oral lasix 40 mg daily for 7 days after discharge.  Further diuretic regimen per outpatient cardiology followup.  Mild hematuria Hgb stable in 11's, urine light pink.  Likely due to being on Eliquis.  Eliquis d/c'ed prior to discharge.   Discharge Diagnoses:  Principal Problem:   HCAP (healthcare-associated pneumonia) Active Problems:   Multiple myeloma ([place])   Benign essential HTN   Type 2 diabetes mellitus without complication,  without long-term current use of insulin (HCC)   CAD (coronary artery disease) of artery bypass graft   Squamous cell skin cancer   Severe sepsis (HCC)   HLD (hyperlipidemia)   GERD (gastroesophageal reflux  disease)   Depression with anxiety   Chronic diastolic CHF (congestive heart failure) (HCC)   Elevated troponin   Atrial fibrillation with RVR ([place])   T7 vertebral fracture (HCC)   Aspiration pneumonia [facility])    Discharge Instructions:  Allergies as of 11/13/2020      Reactions   Pravachol [pravastatin]    Pravastatin Sodium Other (See Comments)   Statins Other (See Comments)   Muscle aches   Zocor [simvastatin] Other (See Comments)   Muscle aches      Medication List    STOP taking these medications   ibuprofen 200 MG tablet Commonly known as: ADVIL   multivitamin with minerals Tabs tablet   PARoxetine 10 MG tablet Commonly known as: PAXIL   zinc sulfate 220 (50 Zn) MG capsule     TAKE these medications   ALPRAZolam 0.25 MG tablet Commonly known as: XANAX Take 0.25 mg by mouth at bedtime as needed. for sleep   ascorbic acid 500 MG tablet Commonly known as: VITAMIN C Take 1 tablet (500 mg total) by mouth daily.   aspirin 81 MG tablet Take 81 mg by mouth daily.   Bee Pollen 500 MG Chew Chew 1 tablet by mouth 2 (two) times daily.   Calcium Carbonate-Vitamin D 600-400 MG-UNIT chew tablet Chew 1 tablet by mouth 2 (two) times daily.   ergocalciferol 1.25 MG (50000 UT) capsule Commonly known as: VITAMIN D2 Take 50,000 Units by mouth once a week. Monday   furosemide 40 MG tablet Commonly known as: Lasix Take 1 tablet (40 mg total) by mouth daily for 7 days.   glucose blood test strip USE ONCE DAILY. USE AS INSTRUCTED. DX E11.9   guaiFENesin-dextromethorphan 100-10 MG/5ML syrup Commonly known as: ROBITUSSIN DM Take 10 mLs by mouth every 4 (four) hours as needed for cough.   ipratropium-albuterol 0.5-2.5 (3) MG/3ML Soln Commonly known as: DUONEB Take 3 mLs by nebulization every 6 (six) hours as needed.   losartan 25 MG tablet Commonly known as: COZAAR Take 25 mg by mouth daily.   lovastatin 40 MG tablet Commonly known as: MEVACOR Take 40 mg by mouth  at bedtime.   metFORMIN 500 MG tablet Commonly known as: GLUCOPHAGE Take 500 mg by mouth daily with breakfast.   omeprazole 20 MG capsule Commonly known as: PRILOSEC Take 20 mg by mouth daily.   propranolol 10 MG tablet Commonly known as: INDERAL Hold due to low heart rate. What changed:   how much to take  how to take this  when to take this  additional instructions   traMADol 50 MG tablet Commonly known as: ULTRAM TAKE 1 TABLET BY MOUTH THREE TIMES A DAY AS NEEDED   VITAMIN B-12 IJ Inject 1 Dose as directed every 30 (thirty) days.        Follow-up Information    Tracie Harrier, MD. Schedule an appointment as soon as possible for a visit in 1 week(s).   Specialty: Internal Medicine Contact information: [place] [address] [phone]        Yolonda Kida, MD. Schedule an appointment as soon as possible for a visit in 1 week(s).   Specialties: Cardiology, Internal Medicine Contact information: [place] [address] [phone]  Allergies  Allergen Reactions  . Pravachol [Pravastatin]   . Pravastatin Sodium Other (See Comments)  . Statins Other (See Comments)    Muscle aches  . Zocor [Simvastatin] Other (See Comments)    Muscle aches     The results of significant diagnostics from this hospitalization (including imaging, microbiology, ancillary and laboratory) are listed below for reference.   Consultations:   Procedures/Studies: DG Chest 2 View  Result Date: 11/06/2020 CLINICAL DATA:  Cough, congestion, and shortness of breath for 4 days. EXAM: CHEST - 2 VIEW COMPARISON:  06/13/2020 FINDINGS: Postoperative changes in the mediastinum. Mild cardiac enlargement. Emphysematous changes in the lungs. Coarse infiltrates in both lungs, most likely representing chronic fibrosis. Similar appearance to previous study. No definite superimposed infiltration or edema. No pleural  effusions. No pneumothorax. Mediastinal contours appear intact. Old bilateral rib fractures. Degenerative changes in the spine and shoulders. 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 No evidence of active pulmonary disease. Electronically Signed   By: Lucienne Capers M.D.   On: 11/06/2020 23:01   CT Angio Chest PE W and/or Wo Contrast  Result Date: 11/07/2020 CLINICAL DATA:  Pulmonary embolism.  Cough, chest congestion EXAM: CT ANGIOGRAPHY CHEST WITH CONTRAST TECHNIQUE: Multidetector CT imaging of the chest was performed using the standard protocol during bolus administration of intravenous contrast. Multiplanar CT image reconstructions and MIPs were obtained to evaluate the vascular anatomy. CONTRAST:  71m OMNIPAQUE IOHEXOL 350 MG/ML SOLN COMPARISON:  06/14/2020 FINDINGS: Cardiovascular: There is excellent opacification of the pulmonary arterial vasculature. No intraluminal filling defect identified to suggest acute pulmonary embolism. The central pulmonary arteries are moderately enlarged in keeping with changes of pulmonary arterial hypertension. Moderate multi-vessel coronary artery calcification. Global cardiac size is mildly enlarged. There is dilation of the right ventricle and right atrium in keeping with elevated right heart pressures. Coronary artery bypass grafting has been performed. No pericardial effusion. Moderate atherosclerotic calcification is seen within the thoracic aorta. No aortic aneurysm identified. Mediastinum/Nodes: Thyroid unremarkable. Several shotty mediastinal lymph nodes are identified within the prevascular, right periaortic, and subcarinal lymph node groups, likely reactive in nature. No frankly pathologic thoracic adenopathy. The esophagus is unremarkable. Lungs/Pleura: Mild centrilobular and paraseptal emphysema is again noted. There is stable pleural calcification and a chronic loculated left pleural effusion again identified with associated  rounded atelectasis within the left lower lobe. Stable parenchymal scarring within the basilar lingula, likely posttraumatic or post inflammatory in nature. There has developed, however, new nodular infiltrate within the basilar right lower lobe and right middle lobe likely related to acute infection or aspiration. No pneumothorax. No pleural effusion on the right. Central airways are widely patent. Asymmetric bronchial wall thickening is seen within the right lower lobe in keeping with airway inflammation. Upper Abdomen: Nodular contour of the liver and hypertrophy of the left hepatic lobe is in keeping with changes of cirrhosis. Cholelithiasis noted. No acute abnormality. Musculoskeletal: There are innumerable lytic lesions seen throughout the visualized axial skeleton in keeping with changes related to the patient's known multiple myeloma. There is a a stable pathologic fracture of T10. There is a new pathologic transverse fracture through the T7 vertebral body which does not appear to involve the posterior elements. No associated listhesis. Mild associated paravertebral soft tissue swelling present suggesting a in acute to subacute fracture. Review of the MIP images confirms the above findings. IMPRESSION: No pulmonary embolism. Moderate dilation of the central pulmonary arteries in keeping with changes of pulmonary arterial hypertension. Enlargement of the  right heart in keeping with elevated right heart pressures. Interval development of nodular infiltrate within the right lung base, infection versus aspiration. Associated bronchial wall thickening within the right lower lobe in keeping with airway inflammation. Chronic loculated small left pleural effusion with associated pleural calcification and rounded atelectasis involving the left lower lobe and lingula, likely post inflammatory or posttraumatic in nature. Innumerable lytic lesions throughout the visualized axial spine in keeping with the patient's known  multiple myeloma. New acute to subacute transverse fracture of T7 which does not appear to involve the posterior elements it is not associated with significant listhesis or loss of height. This does, however, involve both the anterior and posterior aspects of the vertebral body and surgical consultation is advised for further management. Stable chronic pathologic fracture of T10. Aortic Atherosclerosis (ICD10-I70.0) and Emphysema (ICD10-J43.9). Attempts are being made at this time to contact the managing clinician for direct communication. Electronically Signed   By: Fidela Salisbury MD   On: 11/07/2020 04:52   CT CERVICAL SPINE WO CONTRAST  Result Date: 11/07/2020 CLINICAL DATA:  History of multiple myeloma. Mid back pain. T7 fracture. EXAM: CT CERVICAL SPINE WITHOUT CONTRAST TECHNIQUE: Multidetector CT imaging of the cervical spine was performed without intravenous contrast. Multiplanar CT image reconstructions were also generated. COMPARISON:  None. FINDINGS: Alignment: Normal alignment. Skull base and vertebrae: Diffuse bone demineralization with multiple lucent lesions throughout the cervical spine consistent known multiple myeloma. Bridging anterior and posterior osteophytes consistent with ankylosis. No acute vertebral compression deformities are identified Soft tissues and spinal canal: No paraspinal soft tissue mass or infiltration identified. No prevertebral soft tissue swelling. Carotid vascular calcifications. Disc levels: Diffuse disc space narrowing with endplate hypertrophic changes consistent with diffuse degenerative change. Upper chest: Visualized portions of the upper lungs demonstrate scarring in the lung apices with left apical pleural thickening and calcification, likely postinflammatory. Other: None. IMPRESSION: 1. Diffuse bone demineralization with multiple lucent lesions throughout the cervical spine consistent with known multiple myeloma. 2. Bridging anterior and posterior osteophytes  consistent with ankylosis. 3. No acute displaced fractures identified. 4. Carotid vascular calcifications. 5. Scarring in the lung apices with left apical pleural thickening and calcification, likely postinflammatory. Electronically Signed   By: Lucienne Capers M.D.   On: 11/07/2020 15:22   CT LUMBAR SPINE WO CONTRAST  Result Date: 11/07/2020 CLINICAL DATA:  Mid back pain.  T7 fracture. EXAM: CT LUMBAR SPINE WITHOUT CONTRAST TECHNIQUE: Multidetector CT imaging of the lumbar spine was performed without intravenous contrast administration. Multiplanar CT image reconstructions were also generated. COMPARISON:  CT chest 11/07/2020.  MRI lumbar spine 04/06/2019 FINDINGS: Segmentation: 5 lumbar type vertebral bodies. Alignment: Normal alignment.  No anterior subluxations. Vertebrae: Diffuse heterogeneous bone demineralization with multiple lucent lesions demonstrated throughout all lumbar segments and the sacrum and visualized pelvis. Changes are consistent with history of multiple myeloma. No acute vertebral compression deformities. Paraspinal and other soft tissues: No paraspinal soft tissue mass or infiltration demonstrated. Fatty atrophy of the posterior paraspinal muscles. Calcification aorta. Suggestion of gallstones. Disc levels: Degenerative changes with narrowed interspaces and endplate hypertrophic changes throughout. Degenerative changes most prominent at T12-L1 level. IMPRESSION: 1. No acute displaced fractures identified in the lumbar spine. Multiple lucent lesions demonstrated throughout the visualized skeletal components consistent with history of multiple myeloma. 2. Degenerative changes throughout the lumbar spine. 3. Suggestion of gallstones. Aortic Atherosclerosis (ICD10-I70.0). Electronically Signed   By: Lucienne Capers M.D.   On: 11/07/2020 15:19   MR THORACIC SPINE WO CONTRAST  Result  Date: 11/08/2020 CLINICAL DATA:  75 year old male with multiple myeloma undergoing treatment. Large scalp  squamous cell carcinoma undergoing radiation. Cough. T7 vertebral fracture on recent CTA. EXAM: MRI THORACIC SPINE WITHOUT CONTRAST TECHNIQUE: Multiplanar, multisequence MR imaging of the thoracic spine was performed. No intravenous contrast was administered. COMPARISON:  CTA chest 11/07/2020.  Lumbar spine CT 11/07/2020. FINDINGS: Limited cervical spine imaging: Straightening of cervical lordosis, otherwise grossly normal. Thoracic spine segmentation: Normal, although the abnormal thoracic spine on recent chest CTA was miss numbered, the abnormal vertebra is T8. Alignment:  Exaggerated thoracic kyphosis.  No spondylolisthesis. Vertebrae: Severe marrow edema throughout the fractured T8 vertebral body, and patchy severe edema also in the adjacent T9 anterior superior endplate. Trace marrow edema in the inferior posterior T7 endplate. And the comparison chest CTA demonstrates widespread thoracic spine ankylosis with the oblique fracture through T8 superimposed on underlying interbody ankylosis. However, there does not appear to be associated posterior element or posterior column disruption on either these images or the CT yesterday. Chronic moderate to severe T11 compression fracture superimposed. At other thoracic and visible upper lumbar levels marrow signal is within normal limits. However, there are occasional expansile posterior rib lesions (right posterior 8th rib on series 23, image 35). Cord: No spinal cord signal abnormality is identified. But note extensive thoracic epidural lipomatosis (maximal T5 through T7 series 21, image 16) effacing CSF from the thecal sac. Furthermore, heterogeneous increased STIR signal in the dorsal spinal canal at T5 through T7 is suspicious for epidural or subdural blood products. See series 22, image 16 and series 23, image 18. This contributes to the mild spinal stenosis at those levels. Conus medullaris appears to remain normal at T12-L1. Paraspinal and other soft tissues:  Confluent right lateral paraspinal soft tissue edema centered at T8. No discrete soft tissue hematoma. Superimposed abnormal pleural spaces and lung parenchyma as seen by CT, including left lung fibrothorax with perhaps small superimposed acute pleural effusion. Stable visible upper abdominal viscera. Disc levels: Widespread thoracic spine ankylosis as demonstrated by CT. No significant degenerative changes to exacerbate the multifactorial spinal stenosis described in the cord section above. IMPRESSION: 1. T8 (not T7) Acute Vertebral Body Fracture is comminuted and oblique, and occurs in the setting of diffuse thoracic Spinal Ankylosis. However, there does not seem to be posterior element or posterior column disruption associated with this injury. Still, recommend Spine Surgery consultation if not already done in regard to immobilization/fixation. 2. Small volume dorsal epidural or subdural hematoma associated with #1 suspected from the T5 to the T7 level. And the blood is superimposed on widespread underlying thoracic epidural lipomatosis. Subsequent multilevel mild thoracic spinal stenosis, although no convincing cord compression or cord signal abnormality. 3. No other acute osseous abnormality in the thoracic spine. Chronic moderate to severe T11 compression fracture. Small expansile posterior right 8th rib myeloma lesion. 4. Abnormal lungs as seen on recent CT including a component of left fibrothorax. Electronically Signed   By: Genevie Ann M.D.   On: 11/08/2020 09:04   ECHOCARDIOGRAM COMPLETE  Result Date: 11/11/2020    ECHOCARDIOGRAM REPORT   Patient Name:   Luis [name]. Date of Exam: 11/10/2020 Medical Rec #:  [id number]        Height:       66.0 in Accession #:    [id number]       Weight:       189.3 lb Date of Birth:  09/21/1946       BSA:  1.954 m Patient Age:    31 years         BP:           134/62 mmHg Patient Gender: M                HR:           55 bpm. Exam Location:  [facility] Procedure: 2D  Echo, Cardiac Doppler and Color Doppler Indications:     Atrial Fibrillation 148.91  History:         Patient has no prior history of Echocardiogram examinations.                  Previous Myocardial Infarction, Signs/Symptoms:Shortness of                  Breath; Risk Factors:Hypertension and Diabetes.  Sonographer:     Sherrie Sport RDCS (AE) Referring Phys:  Soledad Gerlach NIU Diagnosing Phys: Serafina Royals MD  Sonographer Comments: No apical window and Technically challenging study due to limited acoustic windows. IMPRESSIONS  1. Left ventricular ejection fraction, by estimation, is 45 to 50%. The left ventricle has mildly decreased function. The left ventricle demonstrates global hypokinesis. Left ventricular diastolic parameters were normal.  2. Right ventricular systolic function is normal. The right ventricular size is moderately enlarged.  3. Left atrial size was mildly dilated.  4. Right atrial size was mildly dilated.  5. The mitral valve is normal in structure. Mild to moderate mitral valve regurgitation.  6. Tricuspid valve regurgitation is mild to moderate.  7. The aortic valve is normal in structure. Aortic valve regurgitation is trivial. FINDINGS  Left Ventricle: Left ventricular ejection fraction, by estimation, is 45 to 50%. The left ventricle has mildly decreased function. The left ventricle demonstrates global hypokinesis. The left ventricular internal cavity size was normal in size. There is  no left ventricular hypertrophy. Left ventricular diastolic parameters were normal. Right Ventricle: The right ventricular size is moderately enlarged. No increase in right ventricular wall thickness. Right ventricular systolic function is normal. Left Atrium: Left atrial size was mildly dilated. Right Atrium: Right atrial size was mildly dilated. Pericardium: There is no evidence of pericardial effusion. Mitral Valve: The mitral valve is normal in structure. Mild to moderate mitral valve regurgitation. Tricuspid  Valve: The tricuspid valve is normal in structure. Tricuspid valve regurgitation is mild to moderate. Aortic Valve: The aortic valve is normal in structure. Aortic valve regurgitation is trivial. Pulmonic Valve: The pulmonic valve was normal in structure. Pulmonic valve regurgitation is not visualized. Aorta: The aortic root and ascending aorta are structurally normal, with no evidence of dilitation. IAS/Shunts: No atrial level shunt detected by color flow Doppler.  LEFT VENTRICLE PLAX 2D LVIDd:         4.84 cm LVIDs:         3.57 cm LV PW:         0.96 cm LV IVS:        1.28 cm LVOT diam:     2.10 cm LVOT Area:     3.46 cm  LEFT ATRIUM         Index LA diam:    4.00 cm 2.05 cm/m                        PULMONIC VALVE AORTA                 PV Vmax:  0.69 m/s Ao Root diam: 3.60 cm PV Peak grad:   1.9 mmHg                       RVOT Peak grad: 3 mmHg   SHUNTS Systemic Diam: 2.10 cm Serafina Royals MD Electronically signed by Serafina Royals MD Signature Date/Time: 11/11/2020/8:18:56 AM    Final       Labs: BNP (last 3 results) Recent Labs    06/14/20 0356 11/07/20 0729  BNP 91.4 335.4*   Basic Metabolic Panel: Recent Labs  Lab 11/08/20 0524 11/09/20 0602 11/10/20 0600 11/11/20 0627 11/12/20 0438 11/13/20 0607  NA 132* 132* 135  --  135 135  K 4.0 4.4 4.4  --  4.3 3.9  CL 102 102 105  --  100 95*  CO2 21* 22 25  --  28 30  GLUCOSE 170* 124* 94  --  117* 81  BUN 43* 40* 30*  --  15 13  CREATININE 1.01 0.90 0.84  --  0.68 0.67  CALCIUM 8.2* 8.5* 8.5*  --  8.6* 8.8*  MG  --  2.1 1.9 1.7 1.8 1.6*   Liver Function Tests: No results for input(s): AST, ALT, ALKPHOS, BILITOT, PROT, ALBUMIN in the last 168 hours. No results for input(s): LIPASE, AMYLASE in the last 168 hours. No results for input(s): AMMONIA in the last 168 hours. CBC: Recent Labs  Lab 11/06/20 2237 11/08/20 0524 11/09/20 0602 11/12/20 0438 11/13/20 0607  WBC 6.5 6.6 8.1 5.4 5.1  NEUTROABS 5.1  --   --   --   --    HGB 12.6* 10.9* 10.8* 11.1* 11.5*  HCT 37.7* 31.1* 31.9* 33.1* 33.2*  MCV 111.9* 109.1* 110.4* 111.4* 108.9*  PLT 137* 123* 134* 145* 147*   Cardiac Enzymes: No results for input(s): CKTOTAL, CKMB, CKMBINDEX, TROPONINI in the last 168 hours. BNP: Invalid input(s): POCBNP CBG: Recent Labs  Lab 11/12/20 1207 11/12/20 1701 11/12/20 2036 11/13/20 0745 11/13/20 1131  GLUCAP 166* 75 185* 88 184*   D-Dimer No results for input(s): DDIMER in the last 72 hours. Hgb A1c No results for input(s): HGBA1C in the last 72 hours. Lipid Profile No results for input(s): CHOL, HDL, LDLCALC, TRIG, CHOLHDL, LDLDIRECT in the last 72 hours. Thyroid function studies No results for input(s): TSH, T4TOTAL, T3FREE, THYROIDAB in the last 72 hours.  Invalid input(s): FREET3 Anemia work up No results for input(s): VITAMINB12, FOLATE, FERRITIN, TIBC, IRON, RETICCTPCT in the last 72 hours. Urinalysis    Component Value Date/Time   COLORURINE Yellow 08/07/2013 0102   APPEARANCEUR Clear 08/07/2013 0102   LABSPEC 1.023 08/07/2013 0102   PHURINE 5.0 08/07/2013 0102   GLUCOSEU 50 mg/dL 08/07/2013 0102   HGBUR Negative 08/07/2013 0102   BILIRUBINUR Negative 08/07/2013 0102   KETONESUR Negative 08/07/2013 0102   PROTEINUR 30 mg/dL 08/07/2013 0102   NITRITE Negative 08/07/2013 0102   LEUKOCYTESUR Negative 08/07/2013 0102   Sepsis Labs Invalid input(s): PROCALCITONIN,  WBC,  LACTICIDVEN Microbiology Recent Results (from the past 240 hour(s))  Resp Panel by RT-PCR (Flu A&B, Covid) Nasopharyngeal Swab     Status: None   Collection Time: 11/07/20  2:35 AM   Specimen: Nasopharyngeal Swab; Nasopharyngeal(NP) swabs in vial transport medium  Result Value Ref Range Status   SARS Coronavirus 2 by RT PCR NEGATIVE NEGATIVE Final    Comment: (NOTE) SARS-CoV-2 target nucleic acids are NOT DETECTED.  The SARS-CoV-2 RNA is generally detectable in upper respiratory specimens during  the acute phase of infection.  The lowest concentration of SARS-CoV-2 viral copies this assay can detect is 138 copies/mL. A negative result does not preclude SARS-Cov-2 infection and should not be used as the sole basis for treatment or other patient management decisions. A negative result may occur with  improper specimen collection/handling, submission of specimen other than nasopharyngeal swab, presence of viral mutation(s) within the areas targeted by this assay, and inadequate number of viral copies(<138 copies/mL). A negative result must be combined with clinical observations, patient history, and epidemiological information. The expected result is Negative.  Fact Sheet for Patients:  EntrepreneurPulse.com.au  Fact Sheet for Healthcare Providers:  IncredibleEmployment.be  This test is no t yet approved or cleared by the [place] FDA and  has been authorized for detection and/or diagnosis of SARS-CoV-2 by FDA under an Emergency Use Authorization (EUA). This EUA will remain  in effect (meaning this test can be used) for the duration of the COVID-19 declaration under Section 564(b)(1) of the Act, 21 U.S.C.section 360bbb-3(b)(1), unless the authorization is terminated  or revoked sooner.       Influenza A by PCR NEGATIVE NEGATIVE Final   Influenza B by PCR NEGATIVE NEGATIVE Final    Comment: (NOTE) The Xpert Xpress SARS-CoV-2/FLU/RSV plus assay is intended as an aid in the diagnosis of influenza from Nasopharyngeal swab specimens and should not be used as a sole basis for treatment. Nasal washings and aspirates are unacceptable for Xpert Xpress SARS-CoV-2/FLU/RSV testing.  Fact Sheet for Patients: EntrepreneurPulse.com.au  Fact Sheet for Healthcare Providers: IncredibleEmployment.be  This test is not yet approved or cleared by the [place] FDA and has been authorized for detection and/or diagnosis of SARS-CoV-2 by FDA  under an Emergency Use Authorization (EUA). This EUA will remain in effect (meaning this test can be used) for the duration of the COVID-19 declaration under Section 564(b)(1) of the Act, 21 U.S.C. section 360bbb-3(b)(1), unless the authorization is terminated or revoked.  Performed at [facility], [address] [place], [place] [address]   Culture, blood (single)     Status: None   Collection Time: 11/07/20  5:50 AM   Specimen: BLOOD  Result Value Ref Range Status   Specimen Description BLOOD LFA  Final   Special Requests   Final    BOTTLES DRAWN AEROBIC AND ANAEROBIC Blood Culture adequate volume   Culture   Final    NO GROWTH 5 DAYS Performed at [facility], [address] [place], [place] [address]    Report Status 11/12/2020 FINAL  Final  MRSA PCR Screening     Status: None   Collection Time: 11/07/20 12:39 PM   Specimen: Nasal Mucosa; Nasopharyngeal  Result Value Ref Range Status   MRSA by PCR NEGATIVE NEGATIVE Final    Comment:        The GeneXpert MRSA Assay (FDA approved for NASAL specimens only), is one component of a comprehensive MRSA colonization surveillance program. It is not intended to diagnose MRSA infection nor to guide or monitor treatment for MRSA infections. Performed at [facility], [address] [place], [place] [address]   Resp Panel by RT-PCR (Flu A&B, Covid) Nasopharyngeal Swab     Status: None   Collection Time: 11/12/20 12:20 PM   Specimen: Nasopharyngeal Swab; Nasopharyngeal(NP) swabs in vial transport medium  Result Value Ref Range Status   SARS Coronavirus 2 by RT PCR NEGATIVE NEGATIVE Final    Comment: (NOTE) SARS-CoV-2 target nucleic acids are NOT DETECTED.  The SARS-CoV-2 RNA is generally detectable in upper respiratory specimens during the acute phase of infection. The lowest concentration of SARS-CoV-2 viral copies this assay can detect is 138 copies/mL. A negative result does not preclude  SARS-Cov-2 infection and should not be used as the sole basis for treatment or other patient management decisions. A negative result may occur with  improper specimen collection/handling, submission of specimen other than nasopharyngeal swab, presence of viral mutation(s) within the areas targeted by this assay, and inadequate number of viral copies(<138 copies/mL). A negative result must be combined with clinical observations, patient history, and epidemiological information. The expected result is Negative.  Fact Sheet for Patients:  EntrepreneurPulse.com.au  Fact Sheet for Healthcare Providers:  IncredibleEmployment.be  This test is no t yet approved or cleared by the [place] FDA and  has been authorized for detection and/or diagnosis of SARS-CoV-2 by FDA under an Emergency Use Authorization (EUA). This EUA will remain  in effect (meaning this test can be used) for the duration of the COVID-19 declaration under Section 564(b)(1) of the Act, 21 U.S.C.section 360bbb-3(b)(1), unless the authorization is terminated  or revoked sooner.       Influenza A by PCR NEGATIVE NEGATIVE Final   Influenza B by PCR NEGATIVE NEGATIVE Final    Comment: (NOTE) The Xpert Xpress SARS-CoV-2/FLU/RSV plus assay is intended as an aid in the diagnosis of influenza from Nasopharyngeal swab specimens and should not be used as a sole basis for treatment. Nasal washings and aspirates are unacceptable for Xpert Xpress SARS-CoV-2/FLU/RSV testing.  Fact Sheet for Patients: EntrepreneurPulse.com.au  Fact Sheet for Healthcare Providers: IncredibleEmployment.be  This test is not yet approved or cleared by the [place] FDA and has been authorized for detection and/or diagnosis of SARS-CoV-2 by FDA under an Emergency Use Authorization (EUA). This EUA will remain in effect (meaning this test can be used) for the duration of  the COVID-19 declaration under Section 564(b)(1) of the Act, 21 U.S.C. section 360bbb-3(b)(1), unless the authorization is terminated or revoked.  Performed at [facility], [address] [place],  [address]      Total time spend on discharging this patient, including the last patient exam, discussing the hospital stay, instructions for ongoing care as it relates to all pertinent caregivers, as well as preparing the medical discharge records, prescriptions, and/or referrals as applicable, is 35 minutes.    Enzo Bi, MD  [facility] 11/13/2020, 11:47 AM

## 2020-11-14 ENCOUNTER — Ambulatory Visit: Payer: Medicare Other

## 2020-11-17 ENCOUNTER — Ambulatory Visit: Payer: Medicare Other

## 2020-11-18 ENCOUNTER — Ambulatory Visit: Payer: Medicare Other

## 2020-11-18 ENCOUNTER — Telehealth: Payer: Self-pay | Admitting: *Deleted

## 2020-11-18 NOTE — Telephone Encounter (Signed)
Patient was discharged to a rehab facility in [place] and does not know the length of time he will be there.   Patient has received 18 out of 25 radiation treatments to his scalp.   Per Dr. Baruch Gouty, discontinue remaining treatments and have patient follow up in 1-2 months.   Patient called and given follow up appt. For 01/05/21 and told to call if he gets out of facility sooner or if it will be after that appointment and we can reschedule.

## 2020-11-19 ENCOUNTER — Ambulatory Visit: Payer: Medicare Other

## 2020-11-19 ENCOUNTER — Telehealth: Payer: Self-pay | Admitting: Hematology and Oncology

## 2020-11-19 NOTE — Telephone Encounter (Signed)
Pt. called to say he was in [facility]. He states he will be in there for awhile and will call back about appt. when he is released from the center.

## 2020-11-20 ENCOUNTER — Ambulatory Visit: Payer: Medicare Other

## 2020-11-21 ENCOUNTER — Ambulatory Visit: Payer: Medicare Other

## 2020-11-24 ENCOUNTER — Ambulatory Visit: Payer: Medicare Other

## 2020-11-25 ENCOUNTER — Ambulatory Visit: Payer: Medicare Other

## 2020-11-26 ENCOUNTER — Ambulatory Visit: Payer: Medicare Other

## 2020-11-27 ENCOUNTER — Ambulatory Visit: Payer: Medicare Other

## 2020-11-28 ENCOUNTER — Ambulatory Visit: Payer: Medicare Other

## 2020-12-08 ENCOUNTER — Encounter: Payer: Self-pay | Admitting: Radiation Oncology

## 2020-12-08 ENCOUNTER — Ambulatory Visit
Admission: RE | Admit: 2020-12-08 | Discharge: 2020-12-08 | Disposition: A | Discharge status: Home / Self Care | Payer: Medicare Other | Source: Ambulatory Visit | Attending: Radiation Oncology | Admitting: Radiation Oncology

## 2020-12-08 VITALS — BP 114/51 | HR 72 | Temp 97.5°F | Wt 175.6 lb

## 2020-12-08 DIAGNOSIS — Z923 Personal history of irradiation: Secondary | ICD-10-CM | POA: Insufficient documentation

## 2020-12-08 DIAGNOSIS — C4442 Squamous cell carcinoma of skin of scalp and neck: Secondary | ICD-10-CM | POA: Diagnosis present

## 2020-12-08 DIAGNOSIS — C4492 Squamous cell carcinoma of skin, unspecified: Secondary | ICD-10-CM

## 2020-12-08 DIAGNOSIS — C9000 Multiple myeloma not having achieved remission: Secondary | ICD-10-CM | POA: Insufficient documentation

## 2020-12-08 NOTE — Progress Notes (Signed)
Radiation Oncology Follow up Note  Name: Luis [name].   Date:   12/08/2020 MRN:  [id number] DOB: [birth year]    This 75 y.o. male presents to the clinic today for 1 month follow-up status post electron-beam therapy to his scalp for locally bad squamous cell carcinoma and patient with known history of multiple myeloma.  REFERRING PROVIDER: Tracie Harrier, MD  HPI: Patient is a 75 year old male who discontinued radiation therapy prematurely since he was placed in a skilled nursing home.  He is seen today in routine follow-up about 1 month out after completing radiation.  Area of treatment has granulation tissue at its base he seems to be healing up well.  No progression of disease is noted..  COMPLICATIONS OF TREATMENT: none  FOLLOW UP COMPLIANCE: keeps appointments   PHYSICAL EXAM:  BP (!) 114/51   Pulse 72   Temp (!) 97.5 F (36.4 C) (Tympanic)   Wt 175 lb 9 oz (79.6 kg)   BMI 28.34 kg/m  Area of the scalp has granulation tissue at its base no a viable tumor is noted.  Well-developed well-nourished patient in NAD. HEENT reveals PERLA, EOMI, discs not visualized.  Oral cavity is clear. No oral mucosal lesions are identified. Neck is clear without evidence of cervical or supraclavicular adenopathy. Lungs are clear to A&P. Cardiac examination is essentially unremarkable with regular rate and rhythm without murmur rub or thrill. Abdomen is benign with no organomegaly or masses noted. Motor sensory and DTR levels are equal and symmetric in the upper and lower extremities. Cranial nerves II through XII are grossly intact. Proprioception is intact. No peripheral adenopathy or edema is identified. No motor or sensory levels are noted. Crude visual fields are within normal range.  RADIOLOGY RESULTS: No current films for review  PLAN: At this time I believe we have sufficient dose to controlled his squamous cell carcinoma.  We will continue to observe of asked to see him back in 2 to 3  months for follow-up.  Patient and family know to call with any concerns.  I would like to take this opportunity to thank you for allowing me to participate in the care of your patient.Noreene Filbert, MD

## 2020-12-11 ENCOUNTER — Other Ambulatory Visit: Payer: Self-pay

## 2020-12-11 ENCOUNTER — Inpatient Hospital Stay: Payer: Medicare Other | Attending: Hematology and Oncology

## 2020-12-11 DIAGNOSIS — Z79899 Other long term (current) drug therapy: Secondary | ICD-10-CM | POA: Insufficient documentation

## 2020-12-11 DIAGNOSIS — E538 Deficiency of other specified B group vitamins: Secondary | ICD-10-CM | POA: Insufficient documentation

## 2020-12-11 MED ORDER — CYANOCOBALAMIN 1000 MCG/ML IJ SOLN
1000.0000 ug | Freq: Once | INTRAMUSCULAR | Status: AC
  Administered 2020-12-11: 1000 ug via INTRAMUSCULAR
  Filled 2020-12-11: qty 1

## 2020-12-15 ENCOUNTER — Inpatient Hospital Stay: Payer: Medicare Other

## 2020-12-16 DIAGNOSIS — D61818 Other pancytopenia: Secondary | ICD-10-CM | POA: Insufficient documentation

## 2020-12-22 ENCOUNTER — Other Ambulatory Visit: Payer: Self-pay

## 2020-12-22 ENCOUNTER — Ambulatory Visit (INDEPENDENT_AMBULATORY_CARE_PROVIDER_SITE_OTHER): Payer: Medicare Other | Admitting: Dermatology

## 2020-12-22 DIAGNOSIS — Z85828 Personal history of other malignant neoplasm of skin: Secondary | ICD-10-CM | POA: Diagnosis not present

## 2020-12-22 DIAGNOSIS — L570 Actinic keratosis: Secondary | ICD-10-CM

## 2020-12-22 DIAGNOSIS — L578 Other skin changes due to chronic exposure to nonionizing radiation: Secondary | ICD-10-CM

## 2020-12-22 NOTE — Patient Instructions (Signed)

## 2020-12-22 NOTE — Progress Notes (Unsigned)
   Follow-Up Visit   Subjective  Luis [name]. is a 75 y.o. male who presents for the following: Follow-up (SCC follow up of left ant scalp. He was getting radiation treatments with Dr. Baruch Gouty until he was hospitalized with pneumonia. His last radiation treatment was 11/11/2020./).  The following portions of the chart were reviewed this encounter and updated as appropriate:   Tobacco  Allergies  Meds  Problems  Med Hx  Surg Hx  Fam Hx     Review of Systems:  No other skin or systemic complaints except as noted in HPI or Assessment and Plan.  Objective  Well appearing patient in no apparent distress; mood and affect are within normal limits.  A focused examination was performed including scalp, face, hands. Relevant physical exam findings are noted in the Assessment and Plan.  Objective  left ant scalp: 5.0 x 1.5 cm crusted erosion - no obvious tumor remaining  Images    Objective  Right dorsum hand x 1, scalp x 8 (9): Erythematous thin papules/macules with gritty scale of scalp. Hyperkeratotic papule of right dorsum hand.   Assessment & Plan    Actinic Damage - chronic, secondary to cumulative UV radiation exposure/sun exposure over time - diffuse scaly erythematous macules with underlying dyspigmentation - Recommend daily broad spectrum sunscreen SPF 30+ to sun-exposed areas, reapply every 2 hours as needed.  - Call for new or changing lesions.  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  He had to stop treatment due to pneumonia hospitalization. No evidence of obvious persistence today. Advised patient to keep his follow up appointment with Dr. Baruch Gouty in May.  AK (actinic keratosis) (9) Right dorsum hand x 1, scalp x 8  Recheck right dorsum hand on follow up  Destruction of lesion - Right dorsum hand x 1, scalp x 8 Complexity: simple   Destruction method: cryotherapy   Informed consent:  discussed and consent obtained   Timeout:  patient name, date of birth, surgical site, and procedure verified Lesion destroyed using liquid nitrogen: Yes   Region frozen until ice ball extended beyond lesion: Yes   Outcome: patient tolerated procedure well with no complications   Post-procedure details: wound care instructions given    Return in about 6 weeks (around 02/02/2021) for AK follow up.  I, Ashok Cordia, CMA, am acting as scribe for Sarina Ser, MD .  Documentation: I have reviewed the above documentation for accuracy and completeness, and I agree with the above.  Sarina Ser, MD

## 2020-12-23 ENCOUNTER — Encounter: Payer: Self-pay | Admitting: Dermatology

## 2021-01-05 ENCOUNTER — Ambulatory Visit: Payer: Medicare Other | Admitting: Radiation Oncology

## 2021-01-08 ENCOUNTER — Other Ambulatory Visit: Payer: Self-pay

## 2021-01-08 ENCOUNTER — Inpatient Hospital Stay: Payer: Medicare Other | Attending: Hematology and Oncology

## 2021-01-08 DIAGNOSIS — C4442 Squamous cell carcinoma of skin of scalp and neck: Secondary | ICD-10-CM | POA: Diagnosis not present

## 2021-01-08 DIAGNOSIS — R531 Weakness: Secondary | ICD-10-CM | POA: Insufficient documentation

## 2021-01-08 DIAGNOSIS — Z923 Personal history of irradiation: Secondary | ICD-10-CM | POA: Diagnosis not present

## 2021-01-08 DIAGNOSIS — Z7982 Long term (current) use of aspirin: Secondary | ICD-10-CM | POA: Diagnosis not present

## 2021-01-08 DIAGNOSIS — K219 Gastro-esophageal reflux disease without esophagitis: Secondary | ICD-10-CM | POA: Insufficient documentation

## 2021-01-08 DIAGNOSIS — I2510 Atherosclerotic heart disease of native coronary artery without angina pectoris: Secondary | ICD-10-CM | POA: Insufficient documentation

## 2021-01-08 DIAGNOSIS — F419 Anxiety disorder, unspecified: Secondary | ICD-10-CM | POA: Diagnosis not present

## 2021-01-08 DIAGNOSIS — Z87891 Personal history of nicotine dependence: Secondary | ICD-10-CM | POA: Insufficient documentation

## 2021-01-08 DIAGNOSIS — Z79899 Other long term (current) drug therapy: Secondary | ICD-10-CM | POA: Insufficient documentation

## 2021-01-08 DIAGNOSIS — Z7984 Long term (current) use of oral hypoglycemic drugs: Secondary | ICD-10-CM | POA: Insufficient documentation

## 2021-01-08 DIAGNOSIS — N289 Disorder of kidney and ureter, unspecified: Secondary | ICD-10-CM | POA: Diagnosis not present

## 2021-01-08 DIAGNOSIS — Z794 Long term (current) use of insulin: Secondary | ICD-10-CM | POA: Insufficient documentation

## 2021-01-08 DIAGNOSIS — M48061 Spinal stenosis, lumbar region without neurogenic claudication: Secondary | ICD-10-CM | POA: Insufficient documentation

## 2021-01-08 DIAGNOSIS — I252 Old myocardial infarction: Secondary | ICD-10-CM | POA: Insufficient documentation

## 2021-01-08 DIAGNOSIS — E538 Deficiency of other specified B group vitamins: Secondary | ICD-10-CM | POA: Insufficient documentation

## 2021-01-08 DIAGNOSIS — Z7901 Long term (current) use of anticoagulants: Secondary | ICD-10-CM | POA: Insufficient documentation

## 2021-01-08 DIAGNOSIS — C9000 Multiple myeloma not having achieved remission: Secondary | ICD-10-CM | POA: Diagnosis not present

## 2021-01-08 DIAGNOSIS — F329 Major depressive disorder, single episode, unspecified: Secondary | ICD-10-CM | POA: Insufficient documentation

## 2021-01-08 DIAGNOSIS — E785 Hyperlipidemia, unspecified: Secondary | ICD-10-CM | POA: Diagnosis not present

## 2021-01-08 DIAGNOSIS — I4891 Unspecified atrial fibrillation: Secondary | ICD-10-CM | POA: Insufficient documentation

## 2021-01-08 DIAGNOSIS — E119 Type 2 diabetes mellitus without complications: Secondary | ICD-10-CM | POA: Insufficient documentation

## 2021-01-08 DIAGNOSIS — Z8616 Personal history of COVID-19: Secondary | ICD-10-CM | POA: Diagnosis not present

## 2021-01-08 DIAGNOSIS — I509 Heart failure, unspecified: Secondary | ICD-10-CM | POA: Insufficient documentation

## 2021-01-08 DIAGNOSIS — I10 Essential (primary) hypertension: Secondary | ICD-10-CM | POA: Diagnosis not present

## 2021-01-08 MED ORDER — CYANOCOBALAMIN 1000 MCG/ML IJ SOLN
1000.0000 ug | Freq: Once | INTRAMUSCULAR | Status: AC
  Administered 2021-01-08: 1000 ug via INTRAMUSCULAR
  Filled 2021-01-08: qty 1

## 2021-01-12 ENCOUNTER — Inpatient Hospital Stay: Payer: Medicare Other

## 2021-01-13 ENCOUNTER — Encounter: Payer: Self-pay | Admitting: Emergency Medicine

## 2021-01-13 ENCOUNTER — Other Ambulatory Visit: Payer: Self-pay

## 2021-01-13 ENCOUNTER — Emergency Department
Admission: EM | Admit: 2021-01-13 | Discharge: 2021-01-13 | Disposition: A | Discharge status: Home / Self Care | Payer: Medicare Other | Attending: Emergency Medicine | Admitting: Emergency Medicine

## 2021-01-13 ENCOUNTER — Emergency Department: Payer: Medicare Other

## 2021-01-13 DIAGNOSIS — E119 Type 2 diabetes mellitus without complications: Secondary | ICD-10-CM | POA: Diagnosis not present

## 2021-01-13 DIAGNOSIS — Z7982 Long term (current) use of aspirin: Secondary | ICD-10-CM | POA: Diagnosis not present

## 2021-01-13 DIAGNOSIS — S42202A Unspecified fracture of upper end of left humerus, initial encounter for closed fracture: Secondary | ICD-10-CM | POA: Diagnosis not present

## 2021-01-13 DIAGNOSIS — S42292A Other displaced fracture of upper end of left humerus, initial encounter for closed fracture: Secondary | ICD-10-CM

## 2021-01-13 DIAGNOSIS — I2510 Atherosclerotic heart disease of native coronary artery without angina pectoris: Secondary | ICD-10-CM | POA: Insufficient documentation

## 2021-01-13 DIAGNOSIS — Z8616 Personal history of COVID-19: Secondary | ICD-10-CM | POA: Diagnosis not present

## 2021-01-13 DIAGNOSIS — Y9289 Other specified places as the place of occurrence of the external cause: Secondary | ICD-10-CM | POA: Diagnosis not present

## 2021-01-13 DIAGNOSIS — I5032 Chronic diastolic (congestive) heart failure: Secondary | ICD-10-CM | POA: Insufficient documentation

## 2021-01-13 DIAGNOSIS — M25552 Pain in left hip: Secondary | ICD-10-CM | POA: Insufficient documentation

## 2021-01-13 DIAGNOSIS — M25532 Pain in left wrist: Secondary | ICD-10-CM | POA: Diagnosis not present

## 2021-01-13 DIAGNOSIS — Z96641 Presence of right artificial hip joint: Secondary | ICD-10-CM | POA: Diagnosis not present

## 2021-01-13 DIAGNOSIS — Z7984 Long term (current) use of oral hypoglycemic drugs: Secondary | ICD-10-CM | POA: Diagnosis not present

## 2021-01-13 DIAGNOSIS — W1839XA Other fall on same level, initial encounter: Secondary | ICD-10-CM | POA: Insufficient documentation

## 2021-01-13 DIAGNOSIS — Z951 Presence of aortocoronary bypass graft: Secondary | ICD-10-CM | POA: Diagnosis not present

## 2021-01-13 DIAGNOSIS — Z87891 Personal history of nicotine dependence: Secondary | ICD-10-CM | POA: Insufficient documentation

## 2021-01-13 DIAGNOSIS — Z85828 Personal history of other malignant neoplasm of skin: Secondary | ICD-10-CM | POA: Insufficient documentation

## 2021-01-13 DIAGNOSIS — R0781 Pleurodynia: Secondary | ICD-10-CM | POA: Insufficient documentation

## 2021-01-13 DIAGNOSIS — Z79899 Other long term (current) drug therapy: Secondary | ICD-10-CM | POA: Insufficient documentation

## 2021-01-13 DIAGNOSIS — I110 Hypertensive heart disease with heart failure: Secondary | ICD-10-CM | POA: Diagnosis not present

## 2021-01-13 DIAGNOSIS — S4992XA Unspecified injury of left shoulder and upper arm, initial encounter: Secondary | ICD-10-CM | POA: Diagnosis present

## 2021-01-13 MED ORDER — LIDOCAINE 5 % EX PTCH: 1.0000 | MEDICATED_PATCH | Freq: Two times a day (BID) | CUTANEOUS | 0 refills | Status: DC

## 2021-01-13 MED ORDER — LIDOCAINE 5 % EX PTCH
1.0000 | MEDICATED_PATCH | CUTANEOUS | Status: DC
  Administered 2021-01-13: 1 via TRANSDERMAL
  Filled 2021-01-13: qty 1

## 2021-01-13 NOTE — ED Triage Notes (Addendum)
Pt via EMS from [facility]. Pt had a mechanical fall on Sunday where the wind made him lose his balance. Pt landed on his L side. Pt c/o L shoulder, humerus, and elbow pain. Pt states that it got increasingly worse over the past two days. Pt able to bend the arm at the elbow but states its to painful to extend or hyperextend the shoulder joint. Pt is A&Ox4 and NAD.

## 2021-01-13 NOTE — Discharge Instructions (Signed)
Wear sling and follow discharge care instructions pending further evaluation by orthopedics.  Call the clinic listed in your discharge care instructions tomorrow and tell them you a follow-up from the emergency room.
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
## 2021-01-13 NOTE — ED Notes (Signed)
See triage note  States he fell a few days ago  Landed on left side having pain to left upper arm/shoulder area and left knee    Bruising noted with swelling to upper arm

## 2021-01-15 ENCOUNTER — Encounter: Payer: Self-pay | Admitting: Hematology and Oncology

## 2021-01-16 NOTE — Telephone Encounter (Signed)
01/16/2021 Spoke w/ pt and informed him Dr. Loletha Grayer can see him on 02/05/21 @ 1:30 for lab/MD/B12. Pt confirmed appt  SRW

## 2021-02-03 ENCOUNTER — Other Ambulatory Visit: Payer: Self-pay | Admitting: *Deleted

## 2021-02-03 DIAGNOSIS — C9000 Multiple myeloma not having achieved remission: Secondary | ICD-10-CM

## 2021-02-04 ENCOUNTER — Ambulatory Visit (INDEPENDENT_AMBULATORY_CARE_PROVIDER_SITE_OTHER): Payer: Medicare Other | Admitting: Dermatology

## 2021-02-04 ENCOUNTER — Other Ambulatory Visit: Payer: Self-pay

## 2021-02-04 DIAGNOSIS — Z85828 Personal history of other malignant neoplasm of skin: Secondary | ICD-10-CM | POA: Diagnosis not present

## 2021-02-04 DIAGNOSIS — L820 Inflamed seborrheic keratosis: Secondary | ICD-10-CM | POA: Diagnosis not present

## 2021-02-04 DIAGNOSIS — L578 Other skin changes due to chronic exposure to nonionizing radiation: Secondary | ICD-10-CM

## 2021-02-04 DIAGNOSIS — C44622 Squamous cell carcinoma of skin of right upper limb, including shoulder: Secondary | ICD-10-CM

## 2021-02-04 DIAGNOSIS — L570 Actinic keratosis: Secondary | ICD-10-CM

## 2021-02-04 DIAGNOSIS — D485 Neoplasm of uncertain behavior of skin: Secondary | ICD-10-CM

## 2021-02-04 DIAGNOSIS — Z8589 Personal history of malignant neoplasm of other organs and systems: Secondary | ICD-10-CM

## 2021-02-04 NOTE — Progress Notes (Signed)
Follow-Up Visit   Subjective  Luis [name]. is a 75 y.o. male who presents for the following: Lesion (R hand - irregular, has been there several months), Actinic Keratosis (Face, scalp, hands - check for new or persistent skin lesions), and hx of SCC (L ant scalp - healing ulceration from radiation therapy ).  The following portions of the chart were reviewed this encounter and updated as appropriate:   Tobacco  Allergies  Meds  Problems  Med Hx  Surg Hx  Fam Hx     Review of Systems:  No other skin or systemic complaints except as noted in HPI or Assessment and Plan.  Objective  Well appearing patient in no apparent distress; mood and affect are within normal limits.  A focused examination was performed including the face, scalp, and hands. Relevant physical exam findings are noted in the Assessment and Plan.  Objective  R hand dorsum: Hyperkeratotic papule 2.2 cm   Objective  Scalp and forehead x 10 (10): Erythematous thin papules/macules with gritty scale.   Objective  Scalp: 4.0 x 1.2 cm crusted erosion - no obvious tumor remaining   Objective  L hand x 1: Erythematous keratotic or waxy stuck-on papule or plaque.   Assessment & Plan  Neoplasm of uncertain behavior of skin R hand dorsum Epidermal / dermal shaving  Lesion diameter (cm):  2.2 Informed consent: discussed and consent obtained   Timeout: patient name, date of birth, surgical site, and procedure verified   Procedure prep:  Patient was prepped and draped in usual sterile fashion Prep type:  Isopropyl alcohol Anesthesia: the lesion was anesthetized in a standard fashion   Anesthetic:  1% lidocaine w/ epinephrine 1-100,000 buffered w/ 8.4% NaHCO3 Instrument used: flexible razor blade   Hemostasis achieved with: pressure, aluminum chloride and electrodesiccation   Outcome: patient tolerated procedure well   Post-procedure details: sterile dressing applied and wound care instructions given    Dressing type: bandage and petrolatum    Destruction of lesion Complexity: extensive   Destruction method: electrodesiccation and curettage   Informed consent: discussed and consent obtained   Timeout:  patient name, date of birth, surgical site, and procedure verified Procedure prep:  Patient was prepped and draped in usual sterile fashion Prep type:  Isopropyl alcohol Anesthesia: the lesion was anesthetized in a standard fashion   Anesthetic:  1% lidocaine w/ epinephrine 1-100,000 buffered w/ 8.4% NaHCO3 Curettage performed in three different directions: Yes   Electrodesiccation performed over the curetted area: Yes   Lesion length (cm):  2.2 Lesion width (cm):  2.2 Margin per side (cm):  0.2 Final wound size (cm):  2.6 Hemostasis achieved with:  pressure, aluminum chloride and electrodesiccation Outcome: patient tolerated procedure well with no complications   Post-procedure details: sterile dressing applied and wound care instructions given   Dressing type: bandage and petrolatum    Specimen 1 - Surgical pathology Differential Diagnosis: D48.5 r/o SCC  ED&C today  Check Margins: No Hyperkeratotic papule 2.2 cm  AK (actinic keratosis) (10) Scalp and forehead x 10 Destruction of lesion - Scalp and forehead x 10 Complexity: simple   Destruction method: cryotherapy   Informed consent: discussed and consent obtained   Timeout:  patient name, date of birth, surgical site, and procedure verified Lesion destroyed using liquid nitrogen: Yes   Region frozen until ice ball extended beyond lesion: Yes   Outcome: patient tolerated procedure well with no complications   Post-procedure details: wound care instructions given  History of squamous cell carcinoma Scalp S/P XRT with significant improvement.  He had to stop treatment due to pneumonia hospitalization. No evidence of obvious persistence today. Advised patient to keep his follow up appointment with Dr. Baruch Gouty in  May.  Inflamed seborrheic keratosis L hand x 1 Destruction of lesion - L hand x 1 Complexity: simple   Destruction method: cryotherapy   Informed consent: discussed and consent obtained   Timeout:  patient name, date of birth, surgical site, and procedure verified Lesion destroyed using liquid nitrogen: Yes   Region frozen until ice ball extended beyond lesion: Yes   Outcome: patient tolerated procedure well with no complications   Post-procedure details: wound care instructions given     Actinic Damage - chronic, secondary to cumulative UV radiation exposure/sun exposure over time - diffuse scaly erythematous macules with underlying dyspigmentation - Recommend daily broad spectrum sunscreen SPF 30+ to sun-exposed areas, reapply every 2 hours as needed.  - Recommend staying in the shade or wearing long sleeves, sun glasses (UVA+UVB protection) and wide brim hats (4-inch brim around the entire circumference of the hat). - Call for new or changing lesions.  Return in about 4 months (around 06/06/2021) for AK and SCC follow up .  Luis [name], CMA, am acting as scribe for Sarina Ser, MD .  Documentation: I have reviewed the above documentation for accuracy and completeness, and I agree with the above.  Sarina Ser, MD
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
## 2021-02-04 NOTE — Patient Instructions (Signed)
If you have any questions or concerns for your doctor, please call our main line at [phone] and press option 4 to reach your doctor's medical assistant. If no one answers, please leave a voicemail as directed and we will return your call as soon as possible. Messages left after 4 pm will be answered the following business day.   You may also send us a message via MyChart. We typically respond to MyChart messages within 1-2 business days.  For prescription refills, please ask your pharmacy to contact our office. Our fax number is [phone].  If you have an urgent issue when the clinic is closed that cannot wait until the next business day, you can page your doctor at the number below.    Please note that while we do our best to be available for urgent issues outside of office hours, we are not available 24/7.   If you have an urgent issue and are unable to reach us, you may choose to seek medical care at your doctor's office, retail clinic, urgent care center, or emergency room.  If you have a medical emergency, please immediately call 911 or go to the emergency department.  Pager Numbers  - Dr. Kowalski: [phone]  - Dr. Moye: [phone]  - Dr. Stewart: [phone]  In the event of inclement weather, please call our main line at [phone] for an update on the status of any delays or closures.  Dermatology Medication Tips: Please keep the boxes that topical medications come in in order to help keep track of the instructions about where and how to use these. Pharmacies typically print the medication instructions only on the boxes and not directly on the medication tubes.   If your medication is too expensive, please contact our office at [phone] option 4 or send us a message through MyChart.   We are unable to tell what your co-pay for medications will be in advance as this is different depending on your insurance coverage. However, we may be able to find a  substitute medication at lower cost or fill out paperwork to get insurance to cover a needed medication.   If a prior authorization is required to get your medication covered by your insurance company, please allow us 1-2 business days to complete this process.  Drug prices often vary depending on where the prescription is filled and some pharmacies may offer cheaper prices.  The website www.goodrx.com contains coupons for medications through different pharmacies. The prices here do not account for what the cost may be with help from insurance (it may be cheaper with your insurance), but the website can give you the price if you did not use any insurance.  - You can print the associated coupon and take it with your prescription to the pharmacy.  - You may also stop by our office during regular business hours and pick up a GoodRx coupon card.  - If you need your prescription sent electronically to a different pharmacy, notify our office through [facility] MyChart or by phone at [phone] option 4.     Wound Care Instructions  1. Cleanse wound gently with soap and water once a day then pat dry with clean gauze. Apply a thing coat of Petrolatum (petroleum jelly, "Vaseline") over the wound (unless you have an allergy to this). We recommend that you use a new, sterile tube of Vaseline. Do not pick or remove scabs. Do not remove the yellow or white "healing tissue" from the base of the wound.    2. Cover the wound with fresh, clean, nonstick gauze and secure with paper tape. You may use Band-Aids in place of gauze and tape if the would is small enough, but would recommend trimming much of the tape off as there is often too much. Sometimes Band-Aids can irritate the skin.  3. You should call the office for your biopsy report after 1 week if you have not already been contacted.  4. If you experience any problems, such as abnormal amounts of bleeding, swelling, significant bruising, significant pain,  or evidence of infection, please call the office immediately.  5. FOR ADULT SURGERY PATIENTS: If you need something for pain relief you may take 1 extra strength Tylenol (acetaminophen) AND 2 Ibuprofen (200mg each) together every 4 hours as needed for pain. (do not take these if you are allergic to them or if you have a reason you should not take them.) Typically, you may only need pain medication for 1 to 3 days.     

## 2021-02-05 ENCOUNTER — Inpatient Hospital Stay (HOSPITAL_BASED_OUTPATIENT_CLINIC_OR_DEPARTMENT_OTHER): Payer: Medicare Other | Admitting: Hematology and Oncology

## 2021-02-05 ENCOUNTER — Encounter: Payer: Self-pay | Admitting: Dermatology

## 2021-02-05 ENCOUNTER — Other Ambulatory Visit: Payer: Self-pay

## 2021-02-05 ENCOUNTER — Inpatient Hospital Stay: Payer: Medicare Other

## 2021-02-05 VITALS — BP 122/38 | HR 46 | Temp 97.9°F | Resp 17 | Wt 186.0 lb

## 2021-02-05 DIAGNOSIS — Z7189 Other specified counseling: Secondary | ICD-10-CM | POA: Diagnosis not present

## 2021-02-05 DIAGNOSIS — E538 Deficiency of other specified B group vitamins: Secondary | ICD-10-CM | POA: Diagnosis not present

## 2021-02-05 DIAGNOSIS — C9000 Multiple myeloma not having achieved remission: Secondary | ICD-10-CM

## 2021-02-05 DIAGNOSIS — C4492 Squamous cell carcinoma of skin, unspecified: Secondary | ICD-10-CM

## 2021-02-05 DIAGNOSIS — D649 Anemia, unspecified: Secondary | ICD-10-CM

## 2021-02-05 LAB — COMPREHENSIVE METABOLIC PANEL
ALT: 14 U/L (ref 0–44)
AST: 31 U/L (ref 15–41)
Albumin: 3.2 g/dL — ABNORMAL LOW (ref 3.5–5.0)
Alkaline Phosphatase: 99 U/L (ref 38–126)
Anion gap: 6 (ref 5–15)
BUN: 21 mg/dL (ref 8–23)
CO2: 24 mmol/L (ref 22–32)
Calcium: 9.1 mg/dL (ref 8.9–10.3)
Chloride: 106 mmol/L (ref 98–111)
Creatinine, Ser: 1.07 mg/dL (ref 0.61–1.24)
GFR, Estimated: >60 mL/min (ref >60)
Glucose, Bld: 95 mg/dL (ref 70–99)
Potassium: 4 mmol/L (ref 3.5–5.1)
Sodium: 136 mmol/L (ref 135–145)
Total Bilirubin: 0.7 mg/dL (ref 0.3–1.2)
Total Protein: 7.8 g/dL (ref 6.5–8.1)

## 2021-02-05 LAB — CBC WITH DIFFERENTIAL/PLATELET
Abs Immature Granulocytes: 0.01 10*3/uL (ref 0.00–0.07)
Basophils Absolute: 0 10*3/uL (ref 0.0–0.1)
Basophils Relative: 1 %
Eosinophils Absolute: 0.1 10*3/uL (ref 0.0–0.5)
Eosinophils Relative: 4 %
HCT: 31.9 % — ABNORMAL LOW (ref 39.0–52.0)
Hemoglobin: 10.9 g/dL — ABNORMAL LOW (ref 13.0–17.0)
Immature Granulocytes: 0 %
Lymphocytes Relative: 34 %
Lymphs Abs: 1 10*3/uL (ref 0.7–4.0)
MCH: 36.3 pg — ABNORMAL HIGH (ref 26.0–34.0)
MCHC: 34.2 g/dL (ref 30.0–36.0)
MCV: 106.3 fL — ABNORMAL HIGH (ref 80.0–100.0)
Monocytes Absolute: 0.4 10*3/uL (ref 0.1–1.0)
Monocytes Relative: 12 %
Neutro Abs: 1.5 10*3/uL — ABNORMAL LOW (ref 1.7–7.7)
Neutrophils Relative %: 49 %
Platelets: 151 10*3/uL (ref 150–400)
RBC: 3 MIL/uL — ABNORMAL LOW (ref 4.22–5.81)
RDW: 15.6 % — ABNORMAL HIGH (ref 11.5–15.5)
WBC: 3 10*3/uL — ABNORMAL LOW (ref 4.0–10.5)
nRBC: 0 % (ref 0.0–0.2)

## 2021-02-05 LAB — FOLATE: Folate: 11.1 ng/mL (ref >5.9)

## 2021-02-05 LAB — IRON AND TIBC
Iron: 125 ug/dL (ref 45–182)
Saturation Ratios: 42 % — ABNORMAL HIGH (ref 17.9–39.5)
TIBC: 297 ug/dL (ref 250–450)
UIBC: 172 ug/dL

## 2021-02-05 LAB — FERRITIN: Ferritin: 117 ng/mL (ref 24–336)

## 2021-02-05 LAB — VITAMIN B12: Vitamin B-12: 650 pg/mL (ref 180–914)

## 2021-02-05 MED ORDER — CYANOCOBALAMIN 1000 MCG/ML IJ SOLN
1000.0000 ug | Freq: Once | INTRAMUSCULAR | Status: AC
  Administered 2021-02-05: 1000 ug via INTRAMUSCULAR
  Filled 2021-02-05: qty 1

## 2021-02-05 NOTE — Progress Notes (Signed)
Patient tolerated Vitamin B12 injection well, no concerns voiced. Patient discharged. Stable.

## 2021-02-06 LAB — KAPPA/LAMBDA LIGHT CHAINS
Kappa free light chain: 94.1 mg/L — ABNORMAL HIGH (ref 3.3–19.4)
Kappa, lambda light chain ratio: 1.62 (ref 0.26–1.65)
Lambda free light chains: 58.2 mg/L — ABNORMAL HIGH (ref 5.7–26.3)

## 2021-02-09 ENCOUNTER — Inpatient Hospital Stay: Payer: Medicare Other

## 2021-02-09 LAB — PROTEIN ELECTROPHORESIS, SERUM
A/G Ratio: 0.8 (ref 0.7–1.7)
Albumin ELP: 3.3 g/dL (ref 2.9–4.4)
Alpha-1-Globulin: 0.3 g/dL (ref 0.0–0.4)
Alpha-2-Globulin: 0.6 g/dL (ref 0.4–1.0)
Beta Globulin: 1.4 g/dL — ABNORMAL HIGH (ref 0.7–1.3)
Gamma Globulin: 1.9 g/dL — ABNORMAL HIGH (ref 0.4–1.8)
Globulin, Total: 4.2 g/dL — ABNORMAL HIGH (ref 2.2–3.9)
Total Protein ELP: 7.5 g/dL (ref 6.0–8.5)

## 2021-02-10 ENCOUNTER — Telehealth: Payer: Self-pay

## 2021-02-10 NOTE — Telephone Encounter (Signed)
-----   Message from Ralene Bathe, MD sent at 02/05/2021  6:49 PM EDT ----- Diagnosis Skin , right hand dorsum WELL DIFFERENTIATED SQUAMOUS CELL CARCINOMA, BASE INVOLVED  Cancer - SCC Already treated Recheck next visit

## 2021-02-10 NOTE — Telephone Encounter (Signed)
LM on VM please return my call  

## 2021-02-16 ENCOUNTER — Telehealth: Payer: Self-pay

## 2021-02-16 NOTE — Telephone Encounter (Signed)
Left message for patient to call office for results/hd 

## 2021-02-16 NOTE — Telephone Encounter (Signed)
-----   Message from Ralene Bathe, MD sent at 02/05/2021  6:49 PM EDT ----- Diagnosis Skin , right hand dorsum WELL DIFFERENTIATED SQUAMOUS CELL CARCINOMA, BASE INVOLVED  Cancer - SCC Already treated Recheck next visit

## 2021-02-16 NOTE — Telephone Encounter (Signed)
Advised patient of results/hd  

## 2021-03-05 ENCOUNTER — Other Ambulatory Visit: Payer: Self-pay

## 2021-03-05 ENCOUNTER — Inpatient Hospital Stay: Payer: Medicare Other | Attending: Oncology

## 2021-03-05 DIAGNOSIS — E538 Deficiency of other specified B group vitamins: Secondary | ICD-10-CM | POA: Insufficient documentation

## 2021-03-05 DIAGNOSIS — Z79899 Other long term (current) drug therapy: Secondary | ICD-10-CM | POA: Diagnosis not present

## 2021-03-05 MED ORDER — CYANOCOBALAMIN 1000 MCG/ML IJ SOLN
1000.0000 ug | Freq: Once | INTRAMUSCULAR | Status: AC
  Administered 2021-03-05: 1000 ug via INTRAMUSCULAR
  Filled 2021-03-05: qty 1

## 2021-03-09 ENCOUNTER — Ambulatory Visit
Admission: RE | Admit: 2021-03-09 | Discharge: 2021-03-09 | Disposition: A | Discharge status: Home / Self Care | Payer: Medicare Other | Source: Ambulatory Visit | Attending: Radiation Oncology | Admitting: Radiation Oncology

## 2021-03-09 ENCOUNTER — Other Ambulatory Visit: Payer: Self-pay

## 2021-03-09 ENCOUNTER — Encounter: Payer: Self-pay | Admitting: Radiation Oncology

## 2021-03-09 DIAGNOSIS — Z923 Personal history of irradiation: Secondary | ICD-10-CM | POA: Insufficient documentation

## 2021-03-09 DIAGNOSIS — C9000 Multiple myeloma not having achieved remission: Secondary | ICD-10-CM | POA: Diagnosis not present

## 2021-03-09 DIAGNOSIS — C4442 Squamous cell carcinoma of skin of scalp and neck: Secondary | ICD-10-CM | POA: Insufficient documentation

## 2021-03-09 DIAGNOSIS — C4492 Squamous cell carcinoma of skin, unspecified: Secondary | ICD-10-CM

## 2021-03-09 NOTE — Progress Notes (Signed)
Radiation Oncology Follow up Note  Name: Luis [name].   Date:   03/09/2021 MRN:  [id number] DOB: 1945-12-02    This 75 y.o. male presents to the clinic today for 62-monthfollow-up status post electron-beam therapy to the scalp for locally advanced squamous cell carcinoma and patient with history of multiple myeloma REFERRING PROVIDER: HTracie Harrier MD  HPI: Patient is a 75year old male now about 3 months having completed radiation therapy to his scalp for locally advanced squamous cell carcinoma the skin.  Seen today in routine follow-up he is doing well he has an area healing with granulation tissue.  He specifically denies pain..  COMPLICATIONS OF TREATMENT: none  FOLLOW UP COMPLIANCE: keeps appointments   PHYSICAL EXAM:  BP (P) 132/71 (BP Location: Left Arm, Patient Position: Sitting)   Pulse (!) (P) 48   Temp (P) 97.7 F (36.5 C) (Tympanic)   Resp (P) 16   Wt (P) 183 lb 9.6 oz (83.3 kg)   BMI (P) 28.76 kg/m  Area of healing by secondary intention of the scalp with good formation of granulation tissue no area of ulcer ulceration or other areas of obvious skin cancer noted.  No evidence of adenopathy in his neck.  Well-developed well-nourished patient in NAD. HEENT reveals PERLA, EOMI, discs not visualized.  Oral cavity is clear. No oral mucosal lesions are identified. Neck is clear without evidence of cervical or supraclavicular adenopathy. Lungs are clear to A&P. Cardiac examination is essentially unremarkable with regular rate and rhythm without murmur rub or thrill. Abdomen is benign with no organomegaly or masses noted. Motor sensory and DTR levels are equal and symmetric in the upper and lower extremities. Cranial nerves II through XII are grossly intact. Proprioception is intact. No peripheral adenopathy or edema is identified. No motor or sensory levels are noted. Crude visual fields are within normal range.  RADIOLOGY RESULTS: No current films to review  PLAN: Present  time patient is doing well healing slowly from his electron-beam therapy for locally benign squamous of carcinoma the scalp.  Of asked to see him back in 3 to 4 months for follow-up.  I have asked him to make a follow-up appointment in the near future with Dr. KNehemiah Massed  Patient knows to call with any concerns.  I would like to take this opportunity to thank you for allowing me to participate in the care of your patient..Noreene Filbert MD

## 2021-03-18 ENCOUNTER — Other Ambulatory Visit: Payer: Self-pay

## 2021-03-18 DIAGNOSIS — C9000 Multiple myeloma not having achieved remission: Secondary | ICD-10-CM

## 2021-03-19 ENCOUNTER — Other Ambulatory Visit: Payer: Medicare Other

## 2021-03-24 ENCOUNTER — Other Ambulatory Visit: Payer: Self-pay

## 2021-03-24 ENCOUNTER — Inpatient Hospital Stay: Payer: Medicare Other | Attending: Nurse Practitioner

## 2021-03-24 DIAGNOSIS — C9000 Multiple myeloma not having achieved remission: Secondary | ICD-10-CM

## 2021-03-24 DIAGNOSIS — E538 Deficiency of other specified B group vitamins: Secondary | ICD-10-CM | POA: Insufficient documentation

## 2021-03-24 DIAGNOSIS — Z79899 Other long term (current) drug therapy: Secondary | ICD-10-CM | POA: Diagnosis not present

## 2021-03-24 LAB — CBC WITH DIFFERENTIAL/PLATELET
Abs Immature Granulocytes: 0.02 10*3/uL (ref 0.00–0.07)
Basophils Absolute: 0 10*3/uL (ref 0.0–0.1)
Basophils Relative: 0 %
Eosinophils Absolute: 0 10*3/uL (ref 0.0–0.5)
Eosinophils Relative: 0 %
HCT: 39.1 % (ref 39.0–52.0)
Hemoglobin: 13.5 g/dL (ref 13.0–17.0)
Immature Granulocytes: 0 %
Lymphocytes Relative: 16 %
Lymphs Abs: 0.9 10*3/uL (ref 0.7–4.0)
MCH: 36.3 pg — ABNORMAL HIGH (ref 26.0–34.0)
MCHC: 34.5 g/dL (ref 30.0–36.0)
MCV: 105.1 fL — ABNORMAL HIGH (ref 80.0–100.0)
Monocytes Absolute: 0.8 10*3/uL (ref 0.1–1.0)
Monocytes Relative: 13 %
Neutro Abs: 4 10*3/uL (ref 1.7–7.7)
Neutrophils Relative %: 71 %
Platelets: 165 10*3/uL (ref 150–400)
RBC: 3.72 MIL/uL — ABNORMAL LOW (ref 4.22–5.81)
RDW: 14.2 % (ref 11.5–15.5)
WBC: 5.7 10*3/uL (ref 4.0–10.5)
nRBC: 0 % (ref 0.0–0.2)

## 2021-03-30 ENCOUNTER — Encounter: Payer: Self-pay | Admitting: *Deleted

## 2021-04-02 ENCOUNTER — Inpatient Hospital Stay: Payer: Medicare Other

## 2021-04-02 ENCOUNTER — Other Ambulatory Visit: Payer: Self-pay

## 2021-04-02 DIAGNOSIS — E538 Deficiency of other specified B group vitamins: Secondary | ICD-10-CM | POA: Diagnosis not present

## 2021-04-02 MED ORDER — CYANOCOBALAMIN 1000 MCG/ML IJ SOLN
1000.0000 ug | Freq: Once | INTRAMUSCULAR | Status: AC
  Administered 2021-04-02: 1000 ug via INTRAMUSCULAR
  Filled 2021-04-02: qty 1

## 2021-04-02 NOTE — Patient Instructions (Signed)

## 2021-04-30 ENCOUNTER — Inpatient Hospital Stay: Payer: Medicare Other

## 2021-05-03 ENCOUNTER — Other Ambulatory Visit: Payer: Self-pay

## 2021-05-04 ENCOUNTER — Other Ambulatory Visit: Payer: Medicare Other

## 2021-05-04 ENCOUNTER — Ambulatory Visit: Payer: Medicare Other | Admitting: Hematology and Oncology

## 2021-05-04 ENCOUNTER — Inpatient Hospital Stay: Payer: Medicare Other

## 2021-05-07 ENCOUNTER — Encounter: Payer: Self-pay | Admitting: Oncology

## 2021-05-07 ENCOUNTER — Inpatient Hospital Stay: Payer: Medicare Other

## 2021-05-07 ENCOUNTER — Inpatient Hospital Stay (HOSPITAL_BASED_OUTPATIENT_CLINIC_OR_DEPARTMENT_OTHER): Payer: Medicare Other | Admitting: Oncology

## 2021-05-07 ENCOUNTER — Other Ambulatory Visit: Payer: Self-pay

## 2021-05-07 ENCOUNTER — Inpatient Hospital Stay: Payer: Medicare Other | Attending: Oncology

## 2021-05-07 VITALS — BP 123/58 | HR 56 | Temp 98.0°F | Resp 16 | Wt 191.6 lb

## 2021-05-07 DIAGNOSIS — I252 Old myocardial infarction: Secondary | ICD-10-CM | POA: Insufficient documentation

## 2021-05-07 DIAGNOSIS — E1122 Type 2 diabetes mellitus with diabetic chronic kidney disease: Secondary | ICD-10-CM | POA: Insufficient documentation

## 2021-05-07 DIAGNOSIS — Z85828 Personal history of other malignant neoplasm of skin: Secondary | ICD-10-CM | POA: Insufficient documentation

## 2021-05-07 DIAGNOSIS — I130 Hypertensive heart and chronic kidney disease with heart failure and stage 1 through stage 4 chronic kidney disease, or unspecified chronic kidney disease: Secondary | ICD-10-CM | POA: Insufficient documentation

## 2021-05-07 DIAGNOSIS — Z79899 Other long term (current) drug therapy: Secondary | ICD-10-CM | POA: Diagnosis not present

## 2021-05-07 DIAGNOSIS — E119 Type 2 diabetes mellitus without complications: Secondary | ICD-10-CM | POA: Insufficient documentation

## 2021-05-07 DIAGNOSIS — I509 Heart failure, unspecified: Secondary | ICD-10-CM | POA: Insufficient documentation

## 2021-05-07 DIAGNOSIS — Z7982 Long term (current) use of aspirin: Secondary | ICD-10-CM | POA: Insufficient documentation

## 2021-05-07 DIAGNOSIS — D649 Anemia, unspecified: Secondary | ICD-10-CM

## 2021-05-07 DIAGNOSIS — I10 Essential (primary) hypertension: Secondary | ICD-10-CM | POA: Diagnosis not present

## 2021-05-07 DIAGNOSIS — M7989 Other specified soft tissue disorders: Secondary | ICD-10-CM

## 2021-05-07 DIAGNOSIS — C4492 Squamous cell carcinoma of skin, unspecified: Secondary | ICD-10-CM | POA: Diagnosis not present

## 2021-05-07 DIAGNOSIS — Z7984 Long term (current) use of oral hypoglycemic drugs: Secondary | ICD-10-CM | POA: Insufficient documentation

## 2021-05-07 DIAGNOSIS — E785 Hyperlipidemia, unspecified: Secondary | ICD-10-CM | POA: Insufficient documentation

## 2021-05-07 DIAGNOSIS — E538 Deficiency of other specified B group vitamins: Secondary | ICD-10-CM

## 2021-05-07 DIAGNOSIS — M48061 Spinal stenosis, lumbar region without neurogenic claudication: Secondary | ICD-10-CM | POA: Diagnosis not present

## 2021-05-07 DIAGNOSIS — K219 Gastro-esophageal reflux disease without esophagitis: Secondary | ICD-10-CM | POA: Insufficient documentation

## 2021-05-07 DIAGNOSIS — K5730 Diverticulosis of large intestine without perforation or abscess without bleeding: Secondary | ICD-10-CM | POA: Diagnosis not present

## 2021-05-07 DIAGNOSIS — I2510 Atherosclerotic heart disease of native coronary artery without angina pectoris: Secondary | ICD-10-CM | POA: Insufficient documentation

## 2021-05-07 DIAGNOSIS — Z923 Personal history of irradiation: Secondary | ICD-10-CM | POA: Diagnosis not present

## 2021-05-07 DIAGNOSIS — N189 Chronic kidney disease, unspecified: Secondary | ICD-10-CM | POA: Diagnosis not present

## 2021-05-07 DIAGNOSIS — C9000 Multiple myeloma not having achieved remission: Secondary | ICD-10-CM | POA: Diagnosis present

## 2021-05-07 DIAGNOSIS — G4730 Sleep apnea, unspecified: Secondary | ICD-10-CM | POA: Insufficient documentation

## 2021-05-07 LAB — COMPREHENSIVE METABOLIC PANEL
ALT: 17 U/L (ref 0–44)
AST: 37 U/L (ref 15–41)
Albumin: 3.3 g/dL — ABNORMAL LOW (ref 3.5–5.0)
Alkaline Phosphatase: 82 U/L (ref 38–126)
Anion gap: 7 (ref 5–15)
BUN: 15 mg/dL (ref 8–23)
CO2: 22 mmol/L (ref 22–32)
Calcium: 9 mg/dL (ref 8.9–10.3)
Chloride: 108 mmol/L (ref 98–111)
Creatinine, Ser: 0.89 mg/dL (ref 0.61–1.24)
GFR, Estimated: >60 mL/min (ref >60)
Glucose, Bld: 127 mg/dL — ABNORMAL HIGH (ref 70–99)
Potassium: 4 mmol/L (ref 3.5–5.1)
Sodium: 137 mmol/L (ref 135–145)
Total Bilirubin: 1 mg/dL (ref 0.3–1.2)
Total Protein: 7.4 g/dL (ref 6.5–8.1)

## 2021-05-07 LAB — CBC WITH DIFFERENTIAL/PLATELET
Abs Immature Granulocytes: 0.02 10*3/uL (ref 0.00–0.07)
Basophils Absolute: 0.1 10*3/uL (ref 0.0–0.1)
Basophils Relative: 1 %
Eosinophils Absolute: 0.2 10*3/uL (ref 0.0–0.5)
Eosinophils Relative: 4 %
HCT: 36 % — ABNORMAL LOW (ref 39.0–52.0)
Hemoglobin: 12.4 g/dL — ABNORMAL LOW (ref 13.0–17.0)
Immature Granulocytes: 0 %
Lymphocytes Relative: 23 %
Lymphs Abs: 1.2 10*3/uL (ref 0.7–4.0)
MCH: 36.7 pg — ABNORMAL HIGH (ref 26.0–34.0)
MCHC: 34.4 g/dL (ref 30.0–36.0)
MCV: 106.5 fL — ABNORMAL HIGH (ref 80.0–100.0)
Monocytes Absolute: 0.7 10*3/uL (ref 0.1–1.0)
Monocytes Relative: 14 %
Neutro Abs: 2.9 10*3/uL (ref 1.7–7.7)
Neutrophils Relative %: 58 %
Platelets: 162 10*3/uL (ref 150–400)
RBC: 3.38 MIL/uL — ABNORMAL LOW (ref 4.22–5.81)
RDW: 15.1 % (ref 11.5–15.5)
WBC: 5.1 10*3/uL (ref 4.0–10.5)
nRBC: 0 % (ref 0.0–0.2)

## 2021-05-07 MED ORDER — CYANOCOBALAMIN 1000 MCG/ML IJ SOLN
1000.0000 ug | Freq: Once | INTRAMUSCULAR | Status: AC
  Administered 2021-05-07: 1000 ug via INTRAMUSCULAR

## 2021-05-08 LAB — PROTEIN ELECTROPHORESIS, SERUM
A/G Ratio: 0.9 (ref 0.7–1.7)
Albumin ELP: 3.3 g/dL (ref 2.9–4.4)
Alpha-1-Globulin: 0.2 g/dL (ref 0.0–0.4)
Alpha-2-Globulin: 0.6 g/dL (ref 0.4–1.0)
Beta Globulin: 1.3 g/dL (ref 0.7–1.3)
Gamma Globulin: 1.3 g/dL (ref 0.4–1.8)
Globulin, Total: 3.5 g/dL (ref 2.2–3.9)
Total Protein ELP: 6.8 g/dL (ref 6.0–8.5)

## 2021-05-09 ENCOUNTER — Encounter: Payer: Self-pay | Admitting: Hematology and Oncology

## 2021-05-09 NOTE — Progress Notes (Signed)
[facility]  [address], [address] [place], [place] [address] Phone: [phone]  Fax: [phone]   Clinic Day:  05/09/2021  Referring physician: Tracie Harrier, MD  Chief Complaint: Luis [name]. is a 75 y.o. male presents for follow-up of multiple myeloma, B12 deficiency   PERTINENT ONCOLOGY HISTORY Patient previously followed up by Dr.Corcoran, patient switched care to me on 05/09/21 Extensive medical record review was performed by me  # Stage III multiple myeloma.   He presented in 02/2013 with left-sided sharp pain.  Evaluation revealed  wide-spread lytic lesions including fracture of the left 5th rib laterally.  CT scans on 02/22/2013 showed innumerable lytic lesions in thoracic spine, sternum, clavicle, scapula, and ribs.    Bone marrow biopsy on 03/13/2013 revealed 15 to 20% plasma cells. Iron stores were absent.  Renal function was normal.  SPEP on 03/01/2013 revealed a 1.4 gm/dL IgG monoclonal lambda.  IgG was 1987.   04/09/2013 Revlimid 25 mg a day (3 weeks on and 1 week off) and Decadron (40 mg once a week)  Patient had a good response to the treatment and M protein resolves.  Revlimid was decreased to 10 mg daily and then 10 mg every day secondary to renal function and the diarrhea.  Revlimid was decreased to 2.5 mg daily 3 weeks on 1 week off.  His last dose is approximately in mid 2021.      Bone survey on 05/30/2015 revealed the majority of small lytic lesions were stable.   Bone survey on 11/27/2015 revealed widespread bony lytic lesions consistent with multiple myeloma.  The vast majority of lesions were stable.  A few small lesions were not previously seen.  One lesion was previously obscured by bowel contents.  Two small lesions in the right distal femoral diaphysis appeared new.   Bone survey on 05/25/2016 revealed no evidence of progressive myelomatous involvement of the skeleton.  Bone survey on 05/16/2017 revealed multiple lucent  lesions as previously seen. There was no convincing evidence of progression or superimposed abnormality since prior study.   Bone survey on 06/01/2018 revealed scattered lucent lesions, with no evidence for progression.   Bone survey on 07/31/2019 was stable with stable bony lucencies consistent with patient's known myeloma. There were no new lesions.   Lumbar spine MRI 04/06/2019 revealed acute to subacute compression fracture at T11 affecting the superior endplate region with a fluid-filled cavity. There was considerable bone marrow edema. This was favored to represent a benign fracture. However, in this clinical setting, that cannot be stated with absolute certainty. No evidence of other myeloma foci in the lower thoracic or lumbar region. There is a 3 cm cystic focus within the left iliac bone most consistent with a previous treated myeloma focus. There was multifactorial spinal stenosis at L4-5 with potential for neural compression on either or both sides, somewhat worse on the right.   He received Zometa every 3 months (last 02/28/2017).  He takes calcium 4 pills/day.   01/13/2021 for left upper extremity pain after a fall on 01/11/2021. Left shoulder x ray showed possible nondisplaced acute fracture about the lateral humeral head. His arm was placed in a sling and he was given a Lidoderm patch. He saw Dr Rudene Christians in orthopedics on 02/03/2021.  Sling was discontinued.  He was to continue PT with passive abduction and flexion exercises.   B12 deficiency.  Patient has been on monthly vitamin B12 injections.  Since 09/18/2021  #Diverticulosis. Abdominal and pelvic CT scan  on 07/15/2015 revealed  extensive sigmoid diverticulosis. There was questionable wall thickening of the ascending colon versus artifact from incomplete distention.  Colonoscopy on 11/11/2015 revealed diverticulosis in the sigmoid colon and distal descending colon and ascending colon.  There was a 2 mm polyp in the mid sigmoid colon.   Pathology revealed a hyperplastic polyp, negative for dysplasia or malignancy.    #Scalp skin cancer-squamous cell carcinoma Left anterior scalp biopsy on 09/08/2020 revealed well differentiated squamous cell carcinoma.  He received 32 Gy in 16 fractions from 10/13/2020 - 11/04/2020.   INTERVAL HISTORY Godwin Tedesco. is a 75 y.o. male who has above history reviewed by me today presents for follow up visit for multiple myeloma Problems and complaints are listed below:  He moved into [place] in January 2022. He does not like living there because everyone is older than him. Patient's wife passed away a few years ago.  She is to follow-up with her colon cancer.  Wife passed away due to brain aneurysm.  Patient misses her a lot.   Past Medical History:  Diagnosis Date   Angina pectoris ([place])    Anxiety    Arthritis    CHF (congestive heart failure) ([place])    Coronary artery disease    Depression    Diabetes mellitus without complication ([place])    Patient takes Metformin.   Diverticulosis    GERD (gastroesophageal reflux disease)    Hyperlipidemia    Hypertension    Multiple myeloma ([place])    Multiple myeloma ([place]) 03/27/2015   Myocardial infarction [facility]) April 2001   widowmaker   Shortness of breath dyspnea    Sleep apnea    No CPAP @ present   Spinal stenosis    Squamous cell carcinoma of skin 02/19/2014   Left dorsal hand. WD SCC with superficial infiltration. Re-shaved 04/23/2014. Re-shaved 09/05/2014.   Squamous cell carcinoma of skin 09/08/2020   Left ant scalp. WD SCC.   Squamous cell carcinoma of skin 02/04/2021   R hand dorsum, EDC     Past Surgical History:  Procedure Laterality Date   CARDIAC CATHETERIZATION     CARPAL TUNNEL RELEASE     CATARACT EXTRACTION     COLONOSCOPY WITH PROPOFOL N/A 11/11/2015   Procedure: COLONOSCOPY WITH PROPOFOL;  Surgeon: Lollie Sails, MD;  Location: [facility] ENDOSCOPY;  Service: Endoscopy;  Laterality: N/A;   CORONARY ARTERY BYPASS  GRAFT     EYE SURGERY Bilateral    Cataract Extraction   INGUINAL HERNIA REPAIR     JOINT REPLACEMENT Right January 09, 2007   Right Total Hip Replacement   PILONIDAL CYST EXCISION     ROTATOR CUFF REPAIR     TOTAL HIP ARTHROPLASTY Right    VENTRAL HERNIA REPAIR N/A 08/15/2015   Procedure: VENTRAL HERNIA REPAIR WITH MESH ;  Surgeon: Leonie Green, MD;  Location: [facility] ORS;  Service: General;  Laterality: N/A;    Family History  Problem Relation Age of Onset   Heart disease Father    Stroke Mother    Prostate cancer Maternal Grandfather 53    Social History:  reports that he quit smoking about 21 years ago. His smoking use included cigarettes. He has a 60.00 pack-year smoking history. He quit smokeless tobacco use about 21 years ago. He reports that he does not drink alcohol and does not use drugs.  Wife passed away in 01-09-2019.  Allergies:  Allergies  Allergen Reactions   Pravachol [Pravastatin]    Pravastatin Sodium Other (  See Comments)   Statins Other (See Comments)    Muscle aches   Zocor [Simvastatin] Other (See Comments)    Muscle aches    Current Medications: Current Outpatient Medications  Medication Sig Dispense Refill   aspirin 81 MG tablet Take 81 mg by mouth daily.     Bee Pollen 500 MG CHEW Chew 1 tablet by mouth 2 (two) times daily.     Calcium Carbonate-Vitamin D 600-400 MG-UNIT chew tablet Chew 1 tablet by mouth 2 (two) times daily.      Cyanocobalamin (VITAMIN B-12 IJ) Inject 1 Dose as directed every 30 (thirty) days.      ergocalciferol (VITAMIN D2) 50000 UNITS capsule Take 50,000 Units by mouth once a week. Monday     glucose blood test strip USE ONCE DAILY. USE AS INSTRUCTED. DX E11.9     losartan (COZAAR) 25 MG tablet Take 25 mg by mouth daily.      lovastatin (MEVACOR) 40 MG tablet Take 40 mg by mouth at bedtime.      metFORMIN (GLUCOPHAGE) 500 MG tablet Take 500 mg by mouth daily with breakfast.     omeprazole (PRILOSEC) 20 MG capsule Take 20 mg by mouth daily.      PARoxetine (PAXIL) 10 MG tablet Take 1 tablet by mouth daily.     propranolol (INDERAL) 10 MG tablet Hold due to low heart rate.     ALPRAZolam (XANAX) 0.25 MG tablet Take 0.25 mg by mouth at bedtime as needed. for sleep (Patient not taking: Reported on 05/07/2021)  3   ascorbic acid (VITAMIN C) 500 MG tablet Take 1 tablet (500 mg total) by mouth daily. (Patient not taking: Reported on 05/07/2021) 90 tablet 0   cephALEXin (KEFLEX) 500 MG capsule Take 500 mg by mouth 3 (three) times daily. (Patient not taking: Reported on 05/07/2021)     ipratropium-albuterol (DUONEB) 0.5-2.5 (3) MG/3ML SOLN Take 3 mLs by nebulization every 6 (six) hours as needed. (Patient not taking: No sig reported)     lidocaine (LIDODERM) 5 % Place 1 patch onto the skin every 12 (twelve) hours. Remove & Discard patch within 12 hours or as directed by MD (Patient not taking: No sig reported) 10 patch 0   traMADol (ULTRAM) 50 MG tablet TAKE 1 TABLET BY MOUTH THREE TIMES A DAY AS NEEDED (Patient not taking: No sig reported) 60 tablet 0   No current facility-administered medications for this visit.    Review of Systems  Constitutional:  Positive for malaise/fatigue. Negative for chills, diaphoresis, fever and weight loss (up 8 lbs).  HENT:  Negative for congestion, ear discharge, ear pain, hearing loss, nosebleeds, sinus pain, sore throat and tinnitus.   Eyes:  Positive for blurred vision.  Respiratory:  Negative for cough, hemoptysis, sputum production and shortness of breath.        Sleep apnea, has CPAP, not using it  Cardiovascular:  Positive for leg swelling. Negative for chest pain and palpitations.  Gastrointestinal:  Negative for abdominal pain, blood in stool, constipation, diarrhea, heartburn, melena, nausea and vomiting.  Genitourinary:  Negative for dysuria, frequency, hematuria and urgency.  Musculoskeletal:  Negative for back pain (spinal stenosis), joint pain, myalgias and neck pain.  Skin:  Negative for itching  and rash.  Neurological:  Positive for weakness. Negative for dizziness, tingling, sensory change and headaches.  Endo/Heme/Allergies:  Does not bruise/bleed easily.       Diabetes  Psychiatric/Behavioral:  Negative for depression and memory loss. The patient is not nervous/anxious  and does not have insomnia.   All other systems reviewed and are negative. Performance status (ECOG): 1  Vitals Blood pressure (!) 123/58, pulse (!) 56, temperature 98 F (36.7 C), resp. rate 16, weight 191 lb 9.3 oz (86.9 kg), SpO2 95 %.   Physical Exam Vitals and nursing note reviewed.  Constitutional:      General: He is not in acute distress.    Appearance: He is not diaphoretic.  HENT:     Head: Normocephalic and atraumatic.     Mouth/Throat:     Mouth: Mucous membranes are moist.     Pharynx: Oropharynx is clear.  Eyes:     General: No scleral icterus.    Extraocular Movements: Extraocular movements intact.     Conjunctiva/sclera: Conjunctivae normal.     Pupils: Pupils are equal, round, and reactive to light.  Cardiovascular:     Rate and Rhythm: Normal rate and regular rhythm.     Heart sounds: Normal heart sounds. No murmur heard. Pulmonary:     Effort: Pulmonary effort is normal. No respiratory distress.     Breath sounds: Normal breath sounds. No wheezing or rales.  Chest:     Chest wall: No tenderness.  Breasts:    Right: No axillary adenopathy or supraclavicular adenopathy.     Left: No axillary adenopathy or supraclavicular adenopathy.  Abdominal:     General: Bowel sounds are normal. There is no distension.     Palpations: Abdomen is soft. There is no mass.     Tenderness: There is no abdominal tenderness. There is no guarding or rebound.  Musculoskeletal:        General: No swelling or tenderness. Normal range of motion.     Cervical back: Normal range of motion and neck supple.     Right lower leg: Edema (1+) present.     Left lower leg: Edema (1+) present.  Lymphadenopathy:      Head:     Right side of head: No preauricular, posterior auricular or occipital adenopathy.     Left side of head: No preauricular, posterior auricular or occipital adenopathy.     Cervical: No cervical adenopathy.     Upper Body:     Right upper body: No supraclavicular or axillary adenopathy.     Left upper body: No supraclavicular or axillary adenopathy.     Lower Body: No right inguinal adenopathy. No left inguinal adenopathy.  Skin:    General: Skin is warm and dry.     Comments: Lesion on scalp.   Neurological:     Mental Status: He is alert and oriented to person, place, and time.  Psychiatric:        Behavior: Behavior normal.        Thought Content: Thought content normal.        Judgment: Judgment normal.       Appointment on 05/07/2021  Component Date Value Ref Range Status   Total Protein ELP 05/07/2021 6.8  6.0 - 8.5 g/dL Final   Albumin ELP 05/07/2021 3.3  2.9 - 4.4 g/dL Final   Alpha-1-Globulin 05/07/2021 0.2  0.0 - 0.4 g/dL Final   Alpha-2-Globulin 05/07/2021 0.6  0.4 - 1.0 g/dL Final   Beta Globulin 05/07/2021 1.3  0.7 - 1.3 g/dL Final   Gamma Globulin 05/07/2021 1.3  0.4 - 1.8 g/dL Final   M-Spike, % 05/07/2021 Not Observed  Not Observed g/dL Final   SPE Interp. 05/07/2021 Comment   Final   Comment: (NOTE) The  SPE pattern appears unremarkable. Evidence of monoclonal protein is not apparent. Performed At: [facility] [place], [place] [id number] Rush Farmer MD DJ:[id number]    Comment 05/07/2021 Comment   Final   Comment: (NOTE) Protein electrophoresis scan will follow via computer, mail, or courier delivery.    Globulin, Total 05/07/2021 3.5  2.2 - 3.9 g/dL Corrected   A/G Ratio 05/07/2021 0.9  0.7 - 1.7 Corrected   Sodium 05/07/2021 137  135 - 145 mmol/L Final   Potassium 05/07/2021 4.0  3.5 - 5.1 mmol/L Final   Chloride 05/07/2021 108  98 - 111 mmol/L Final   CO2 05/07/2021 22  22 - 32 mmol/L Final   Glucose, Bld  05/07/2021 127 (A) 70 - 99 mg/dL Final   Glucose reference range applies only to samples taken after fasting for at least 8 hours.   BUN 05/07/2021 15  8 - 23 mg/dL Final   Creatinine, Ser 05/07/2021 0.89  0.61 - 1.24 mg/dL Final   Calcium 05/07/2021 9.0  8.9 - 10.3 mg/dL Final   Total Protein 05/07/2021 7.4  6.5 - 8.1 g/dL Final   Albumin 05/07/2021 3.3 (A) 3.5 - 5.0 g/dL Final   AST 05/07/2021 37  15 - 41 U/L Final   ALT 05/07/2021 17  0 - 44 U/L Final   Alkaline Phosphatase 05/07/2021 82  38 - 126 U/L Final   Total Bilirubin 05/07/2021 1.0  0.3 - 1.2 mg/dL Final   GFR, Estimated 05/07/2021 >60  >60 mL/min Final   Comment: (NOTE) Calculated using the CKD-EPI Creatinine Equation (2021)    Anion gap 05/07/2021 7  5 - 15 Final   Performed at [facility], [address], [place], [place] [address]   WBC 05/07/2021 5.1  4.0 - 10.5 K/uL Final   RBC 05/07/2021 3.38 (A) 4.22 - 5.81 MIL/uL Final   Hemoglobin 05/07/2021 12.4 (A) 13.0 - 17.0 g/dL Final   HCT 05/07/2021 36.0 (A) 39.0 - 52.0 % Final   MCV 05/07/2021 106.5 (A) 80.0 - 100.0 fL Final   MCH 05/07/2021 36.7 (A) 26.0 - 34.0 pg Final   MCHC 05/07/2021 34.4  30.0 - 36.0 g/dL Final   RDW 05/07/2021 15.1  11.5 - 15.5 % Final   Platelets 05/07/2021 162  150 - 400 K/uL Final   nRBC 05/07/2021 0.0  0.0 - 0.2 % Final   Neutrophils Relative % 05/07/2021 58  % Final   Neutro Abs 05/07/2021 2.9  1.7 - 7.7 K/uL Final   Lymphocytes Relative 05/07/2021 23  % Final   Lymphs Abs 05/07/2021 1.2  0.7 - 4.0 K/uL Final   Monocytes Relative 05/07/2021 14  % Final   Monocytes Absolute 05/07/2021 0.7  0.1 - 1.0 K/uL Final   Eosinophils Relative 05/07/2021 4  % Final   Eosinophils Absolute 05/07/2021 0.2  0.0 - 0.5 K/uL Final   Basophils Relative 05/07/2021 1  % Final   Basophils Absolute 05/07/2021 0.1  0.0 - 0.1 K/uL Final   Immature Granulocytes 05/07/2021 0  % Final   Abs Immature Granulocytes 05/07/2021 0.02  0.00 - 0.07 K/uL Final    Performed at [facility], [address], [place], [place] [address]    Assessment and plan 1. Multiple myeloma not having achieved remission ([place])   2. Right leg swelling   3. Anemia, unspecified type   4. Squamous cell skin cancer   5. B12 deficiency     #Stage III multiple myeloma Labs  are reviewed and discussed with patient. M protein has remained stable and undetectable. Currently not on any treatment.  Continue observation.  We will check a multiple myeloma and light chain ratio every 3 months. Next bone survey on 07/30/2021.   #B12 deficiency 02/05/2021 vitamin B12 level is normal at 650.  Proceed with vitamin B12 injection today. Patient has been on monthly B12 injection.  We will check B12 level at the next visit.  #Chronic macrocytic anemia, hemoglobin 12.4.  MCV 106.  Check folate at the next visit.  #Chronic kidney disease, Avoid nephrotoxin.  Stable creatinine.  Encourage oral hydration.  # Squamous cell carcinoma of the scalp, status post radiation.  Continue follow-up with dermatology. #Right lower extremity swelling, warmth.  Check right lower extremity venous ultrasound to rule out DVT.  9.   RTC in 3 months for MD assessment and labs (CBC with diff, CMP, multiple myeloma panel, light chain ratio, B12, folate)  I discussed the assessment and treatment plan with the patient.  The patient was provided an opportunity to ask questions and all were answered.  The patient agreed with the plan and demonstrated an understanding of the instructions.  The patient was advised to call back if the symptoms worsen or if the condition fails to improve as anticipated.   A total of 45 minutes was spent on this visit.  With 20 minutes spent reviewing previous medical including image findings, pathology reports, 20 minutes counseling the patient on the diagnosis, surveillance plan. additional 5 minutes was spent on answering patient's questions.  Earlie Server, MD,  PhD Hematology Oncology [place] at [facility] 05/09/2021

## 2021-05-28 ENCOUNTER — Other Ambulatory Visit: Payer: Self-pay

## 2021-05-28 ENCOUNTER — Inpatient Hospital Stay: Payer: Medicare Other | Attending: Oncology

## 2021-05-28 DIAGNOSIS — E538 Deficiency of other specified B group vitamins: Secondary | ICD-10-CM | POA: Insufficient documentation

## 2021-05-28 DIAGNOSIS — Z79899 Other long term (current) drug therapy: Secondary | ICD-10-CM | POA: Insufficient documentation

## 2021-05-28 MED ORDER — CYANOCOBALAMIN 1000 MCG/ML IJ SOLN
1000.0000 ug | Freq: Once | INTRAMUSCULAR | Status: AC
Start: 2021-05-28 — End: 2021-05-28
  Administered 2021-05-28: 1000 ug via INTRAMUSCULAR

## 2021-06-09 ENCOUNTER — Ambulatory Visit: Payer: Medicare Other | Attending: Oncology

## 2021-06-15 ENCOUNTER — Ambulatory Visit: Payer: Medicare Other | Admitting: Dermatology

## 2021-06-25 ENCOUNTER — Inpatient Hospital Stay: Payer: Medicare Other | Attending: Oncology

## 2021-06-29 ENCOUNTER — Ambulatory Visit: Payer: Medicare Other | Admitting: Radiation Oncology

## 2021-07-01 ENCOUNTER — Ambulatory Visit
Admission: RE | Admit: 2021-07-01 | Discharge: 2021-07-01 | Disposition: A | Discharge status: Home / Self Care | Payer: Medicare Other | Source: Ambulatory Visit | Attending: Radiation Oncology | Admitting: Radiation Oncology

## 2021-07-01 VITALS — BP 112/76 | HR 54 | Temp 97.9°F | Wt 196.0 lb

## 2021-07-01 DIAGNOSIS — Z923 Personal history of irradiation: Secondary | ICD-10-CM | POA: Insufficient documentation

## 2021-07-01 DIAGNOSIS — Z85828 Personal history of other malignant neoplasm of skin: Secondary | ICD-10-CM | POA: Diagnosis present

## 2021-07-01 DIAGNOSIS — C4492 Squamous cell carcinoma of skin, unspecified: Secondary | ICD-10-CM

## 2021-07-01 NOTE — Progress Notes (Signed)
Radiation Oncology Follow up Note  Name: Luis [name].   Date:   07/01/2021 MRN:  [id number] DOB: 1946-05-23    This 75 y.o. male presents to the clinic today for follow-up for.  Squamous cell carcinoma the scalp status post electron-beam therapy now out 7 months from treatment.  REFERRING PROVIDER: Tracie Harrier, MD  HPI: Patient is a 75 year old male now at 7 months having completed electron-beam therapy to his scalp for locally than squamous cell carcinoma.  Patient is also being treated for multiple myeloma by medical oncology.  He hit his head on a cabinet although the area of previous treatment looks fine with granulation tissue no evidence of discernible cancer is noted.  He is otherwise without complaints..  COMPLICATIONS OF TREATMENT: none  FOLLOW UP COMPLIANCE: keeps appointments   PHYSICAL EXAM:  BP 112/76   Pulse (!) 54   Temp 97.9 F (36.6 C) (Tympanic)   Wt 196 lb (88.9 kg)   BMI 30.70 kg/m  Area of previous treatment shows regulated area of healing with no overt evidence of malignancy.  Neck is clear without evidence of cervical adenopathy.  Well-developed well-nourished patient in NAD. HEENT reveals PERLA, EOMI, discs not visualized.  Oral cavity is clear. No oral mucosal lesions are identified. Neck is clear without evidence of cervical or supraclavicular adenopathy. Lungs are clear to A&P. Cardiac examination is essentially unremarkable with regular rate and rhythm without murmur rub or thrill. Abdomen is benign with no organomegaly or masses noted. Motor sensory and DTR levels are equal and symmetric in the upper and lower extremities. Cranial nerves II through XII are grossly intact. Proprioception is intact. No peripheral adenopathy or edema is identified. No motor or sensory levels are noted. Crude visual fields are within normal range.  RADIOLOGY RESULTS: No current films for review  PLAN: Present time patient is doing well no evidence of recurrent disease  on the scalp.  I will turn follow-up care over to Dr. Nehemiah Massed in dermatology.  I would be happy to reevaluate the patient anytime should further consultation be indicated.  I would like to take this opportunity to thank you for allowing me to participate in the care of your patient.Noreene Filbert, MD

## 2021-07-23 ENCOUNTER — Other Ambulatory Visit: Payer: Medicare Other

## 2021-07-23 ENCOUNTER — Inpatient Hospital Stay: Payer: Medicare Other

## 2021-07-24 ENCOUNTER — Inpatient Hospital Stay: Payer: Medicare Other

## 2021-07-24 ENCOUNTER — Inpatient Hospital Stay: Payer: Medicare Other | Attending: Oncology

## 2021-07-24 ENCOUNTER — Other Ambulatory Visit: Payer: Self-pay

## 2021-07-24 DIAGNOSIS — Z79899 Other long term (current) drug therapy: Secondary | ICD-10-CM | POA: Diagnosis not present

## 2021-07-24 DIAGNOSIS — C4492 Squamous cell carcinoma of skin, unspecified: Secondary | ICD-10-CM

## 2021-07-24 DIAGNOSIS — D649 Anemia, unspecified: Secondary | ICD-10-CM

## 2021-07-24 DIAGNOSIS — M7989 Other specified soft tissue disorders: Secondary | ICD-10-CM

## 2021-07-24 DIAGNOSIS — E538 Deficiency of other specified B group vitamins: Secondary | ICD-10-CM

## 2021-07-24 DIAGNOSIS — C9000 Multiple myeloma not having achieved remission: Secondary | ICD-10-CM

## 2021-07-24 LAB — CBC WITH DIFFERENTIAL/PLATELET
Abs Immature Granulocytes: 0.02 10*3/uL (ref 0.00–0.07)
Basophils Absolute: 0.1 10*3/uL (ref 0.0–0.1)
Basophils Relative: 1 %
Eosinophils Absolute: 0.1 10*3/uL (ref 0.0–0.5)
Eosinophils Relative: 3 %
HCT: 35.5 % — ABNORMAL LOW (ref 39.0–52.0)
Hemoglobin: 11.9 g/dL — ABNORMAL LOW (ref 13.0–17.0)
Immature Granulocytes: 1 %
Lymphocytes Relative: 28 %
Lymphs Abs: 1.2 10*3/uL (ref 0.7–4.0)
MCH: 36.6 pg — ABNORMAL HIGH (ref 26.0–34.0)
MCHC: 33.5 g/dL (ref 30.0–36.0)
MCV: 109.2 fL — ABNORMAL HIGH (ref 80.0–100.0)
Monocytes Absolute: 0.5 10*3/uL (ref 0.1–1.0)
Monocytes Relative: 13 %
Neutro Abs: 2.3 10*3/uL (ref 1.7–7.7)
Neutrophils Relative %: 54 %
Platelets: 148 10*3/uL — ABNORMAL LOW (ref 150–400)
RBC: 3.25 MIL/uL — ABNORMAL LOW (ref 4.22–5.81)
RDW: 15 % (ref 11.5–15.5)
WBC: 4.2 10*3/uL (ref 4.0–10.5)
nRBC: 0 % (ref 0.0–0.2)

## 2021-07-24 LAB — COMPREHENSIVE METABOLIC PANEL
ALT: 15 U/L (ref 0–44)
AST: 34 U/L (ref 15–41)
Albumin: 3.3 g/dL — ABNORMAL LOW (ref 3.5–5.0)
Alkaline Phosphatase: 56 U/L (ref 38–126)
Anion gap: 11 (ref 5–15)
BUN: 28 mg/dL — ABNORMAL HIGH (ref 8–23)
CO2: 21 mmol/L — ABNORMAL LOW (ref 22–32)
Calcium: 8.8 mg/dL — ABNORMAL LOW (ref 8.9–10.3)
Chloride: 103 mmol/L (ref 98–111)
Creatinine, Ser: 1.28 mg/dL — ABNORMAL HIGH (ref 0.61–1.24)
GFR, Estimated: 58 mL/min — ABNORMAL LOW (ref >60)
Glucose, Bld: 98 mg/dL (ref 70–99)
Potassium: 3.9 mmol/L (ref 3.5–5.1)
Sodium: 135 mmol/L (ref 135–145)
Total Bilirubin: 1.3 mg/dL — ABNORMAL HIGH (ref 0.3–1.2)
Total Protein: 7.4 g/dL (ref 6.5–8.1)

## 2021-07-24 LAB — VITAMIN B12: Vitamin B-12: 1060 pg/mL — ABNORMAL HIGH (ref 180–914)

## 2021-07-24 LAB — FOLATE: Folate: 15.6 ng/mL (ref >5.9)

## 2021-07-24 MED ORDER — CYANOCOBALAMIN 1000 MCG/ML IJ SOLN
1000.0000 ug | Freq: Once | INTRAMUSCULAR | Status: AC
  Administered 2021-07-24: 1000 ug via INTRAMUSCULAR

## 2021-07-27 LAB — KAPPA/LAMBDA LIGHT CHAINS
Kappa free light chain: 74.2 mg/L — ABNORMAL HIGH (ref 3.3–19.4)
Kappa, lambda light chain ratio: 1.73 — ABNORMAL HIGH (ref 0.26–1.65)
Lambda free light chains: 42.8 mg/L — ABNORMAL HIGH (ref 5.7–26.3)

## 2021-07-29 LAB — MULTIPLE MYELOMA PANEL, SERUM
Albumin SerPl Elph-Mcnc: 3.1 g/dL (ref 2.9–4.4)
Albumin/Glob SerPl: 0.9 (ref 0.7–1.7)
Alpha 1: 0.3 g/dL (ref 0.0–0.4)
Alpha2 Glob SerPl Elph-Mcnc: 0.6 g/dL (ref 0.4–1.0)
B-Globulin SerPl Elph-Mcnc: 1.3 g/dL (ref 0.7–1.3)
Gamma Glob SerPl Elph-Mcnc: 1.5 g/dL (ref 0.4–1.8)
Globulin, Total: 3.6 g/dL (ref 2.2–3.9)
IgA: 993 mg/dL — ABNORMAL HIGH (ref 61–437)
IgG (Immunoglobin G), Serum: 1410 mg/dL (ref 603–1613)
IgM (Immunoglobulin M), Srm: 62 mg/dL (ref 15–143)
M Protein SerPl Elph-Mcnc: 0.3 g/dL — ABNORMAL HIGH
Total Protein ELP: 6.7 g/dL (ref 6.0–8.5)

## 2021-08-03 ENCOUNTER — Ambulatory Visit: Payer: Medicare Other | Admitting: Dermatology

## 2021-08-05 ENCOUNTER — Other Ambulatory Visit: Payer: Medicare Other

## 2021-08-06 ENCOUNTER — Ambulatory Visit: Payer: Medicare Other | Admitting: Oncology

## 2021-08-06 ENCOUNTER — Telehealth: Payer: Self-pay | Admitting: Oncology

## 2021-08-06 NOTE — Telephone Encounter (Signed)
Patient reports feeling SOB and extreme fatigue. He had an appointment today but requested to reschedule due to having such low energy. Please advise.

## 2021-08-06 NOTE — Telephone Encounter (Signed)
No answer when called. Detailed VM left with MD recommendation.

## 2021-08-19 ENCOUNTER — Other Ambulatory Visit: Payer: Self-pay

## 2021-08-19 ENCOUNTER — Telehealth: Payer: Self-pay

## 2021-08-19 ENCOUNTER — Other Ambulatory Visit: Payer: Self-pay | Admitting: Radiology

## 2021-08-19 ENCOUNTER — Encounter: Payer: Self-pay | Admitting: Oncology

## 2021-08-19 ENCOUNTER — Inpatient Hospital Stay: Payer: Medicare Other | Attending: Oncology | Admitting: Oncology

## 2021-08-19 VITALS — BP 127/75 | HR 90 | Temp 98.0°F | Resp 16 | Wt 195.5 lb

## 2021-08-19 DIAGNOSIS — N289 Disorder of kidney and ureter, unspecified: Secondary | ICD-10-CM

## 2021-08-19 DIAGNOSIS — N2889 Other specified disorders of kidney and ureter: Secondary | ICD-10-CM | POA: Diagnosis not present

## 2021-08-19 DIAGNOSIS — D649 Anemia, unspecified: Secondary | ICD-10-CM | POA: Insufficient documentation

## 2021-08-19 DIAGNOSIS — Z85828 Personal history of other malignant neoplasm of skin: Secondary | ICD-10-CM | POA: Insufficient documentation

## 2021-08-19 DIAGNOSIS — Z7984 Long term (current) use of oral hypoglycemic drugs: Secondary | ICD-10-CM | POA: Diagnosis not present

## 2021-08-19 DIAGNOSIS — Z87891 Personal history of nicotine dependence: Secondary | ICD-10-CM | POA: Diagnosis not present

## 2021-08-19 DIAGNOSIS — M48061 Spinal stenosis, lumbar region without neurogenic claudication: Secondary | ICD-10-CM | POA: Diagnosis not present

## 2021-08-19 DIAGNOSIS — E538 Deficiency of other specified B group vitamins: Secondary | ICD-10-CM | POA: Insufficient documentation

## 2021-08-19 DIAGNOSIS — I252 Old myocardial infarction: Secondary | ICD-10-CM | POA: Diagnosis not present

## 2021-08-19 DIAGNOSIS — Z7961 Long term (current) use of immunomodulator: Secondary | ICD-10-CM | POA: Diagnosis not present

## 2021-08-19 DIAGNOSIS — M7989 Other specified soft tissue disorders: Secondary | ICD-10-CM | POA: Diagnosis not present

## 2021-08-19 DIAGNOSIS — K219 Gastro-esophageal reflux disease without esophagitis: Secondary | ICD-10-CM | POA: Diagnosis not present

## 2021-08-19 DIAGNOSIS — C4492 Squamous cell carcinoma of skin, unspecified: Secondary | ICD-10-CM | POA: Diagnosis not present

## 2021-08-19 DIAGNOSIS — Z7982 Long term (current) use of aspirin: Secondary | ICD-10-CM | POA: Diagnosis not present

## 2021-08-19 DIAGNOSIS — I509 Heart failure, unspecified: Secondary | ICD-10-CM | POA: Diagnosis not present

## 2021-08-19 DIAGNOSIS — I110 Hypertensive heart disease with heart failure: Secondary | ICD-10-CM | POA: Diagnosis not present

## 2021-08-19 DIAGNOSIS — K5730 Diverticulosis of large intestine without perforation or abscess without bleeding: Secondary | ICD-10-CM | POA: Insufficient documentation

## 2021-08-19 DIAGNOSIS — C9000 Multiple myeloma not having achieved remission: Secondary | ICD-10-CM | POA: Insufficient documentation

## 2021-08-19 NOTE — Progress Notes (Signed)
[facility]  [address], [address] [place], [place] [address] Phone: [phone]  Fax: [phone]   Clinic Day:  08/19/2021  Referring physician: Tracie Harrier, MD  Chief Complaint: [name]. is a 75 y.o. male presents for follow-up of multiple myeloma, B12 deficiency   PERTINENT ONCOLOGY HISTORY Patient previously followed up by Dr.Corcoran, patient switched care to me on 05/09/21 Extensive medical record review was performed by me  # Stage III multiple myeloma.   He presented in 02/2013 with left-sided sharp pain.  Evaluation revealed  wide-spread lytic lesions including fracture of the left 5th rib laterally.  CT scans on 02/22/2013 showed innumerable lytic lesions in thoracic spine, sternum, clavicle, scapula, and ribs.    Bone marrow biopsy on 03/13/2013 revealed 15 to 20% plasma cells. Iron stores were absent.  Renal function was normal.  SPEP on 03/01/2013 revealed a 1.4 gm/dL IgG monoclonal lambda.  IgG was 1987.   04/09/2013 Revlimid 25 mg a day (3 weeks on and 1 week off) and Decadron (40 mg once a week)  Patient had a good response to the treatment and M protein resolves.  Revlimid was decreased to 10 mg daily and then 10 mg every day secondary to renal function and the diarrhea.  Revlimid was decreased to 2.5 mg daily 3 weeks on 1 week off.  His last dose is approximately in mid 2021.      Bone survey on 05/30/2015 revealed the majority of small lytic lesions were stable.   Bone survey on 11/27/2015 revealed widespread bony lytic lesions consistent with multiple myeloma.  The vast majority of lesions were stable.  A few small lesions were not previously seen.  One lesion was previously obscured by bowel contents.  Two small lesions in the right distal femoral diaphysis appeared new.   Bone survey on 05/25/2016 revealed no evidence of progressive myelomatous involvement of the skeleton.  Bone survey on 05/16/2017 revealed multiple lucent  lesions as previously seen. There was no convincing evidence of progression or superimposed abnormality since prior study.   Bone survey on 06/01/2018 revealed scattered lucent lesions, with no evidence for progression.   Bone survey on 07/31/2019 was stable with stable bony lucencies consistent with patient's known myeloma. There were no new lesions.   Lumbar spine MRI 04/06/2019 revealed acute to subacute compression fracture at T11 affecting the superior endplate region with a fluid-filled cavity. There was considerable bone marrow edema. This was favored to represent a benign fracture. However, in this clinical setting, that cannot be stated with absolute certainty. No evidence of other myeloma foci in the lower thoracic or lumbar region. There is a 3 cm cystic focus within the left iliac bone most consistent with a previous treated myeloma focus. There was multifactorial spinal stenosis at L4-5 with potential for neural compression on either or both sides, somewhat worse on the right.   He received Zometa every 3 months (last 02/28/2017).  He takes calcium 4 pills/day.   01/13/2021 for left upper extremity pain after a fall on 01/11/2021. Left shoulder x ray showed possible nondisplaced acute fracture about the lateral humeral head. His arm was placed in a sling and he was given a Lidoderm patch. He saw Dr Rudene Christians in orthopedics on 02/03/2021.  Sling was discontinued.  He was to continue PT with passive abduction and flexion exercises.   B12 deficiency.  Patient has been on monthly vitamin B12 injections.  Since 09/18/2021  #Diverticulosis. Abdominal and pelvic CT scan  on 07/15/2015 revealed  extensive sigmoid diverticulosis. There was questionable wall thickening of the ascending colon versus artifact from incomplete distention.  Colonoscopy on 11/11/2015 revealed diverticulosis in the sigmoid colon and distal descending colon and ascending colon.  There was a 2 mm polyp in the mid sigmoid colon.   Pathology revealed a hyperplastic polyp, negative for dysplasia or malignancy.    #Scalp skin cancer-squamous cell carcinoma Left anterior scalp biopsy on 09/08/2020 revealed well differentiated squamous cell carcinoma.  He received 32 Gy in 16 fractions from 10/13/2020 - 11/04/2020.  He moved into [facility] in January 2022. He does not like living there because everyone is older than him. Patient's wife passed away a few years ago.  She had colon cancer.  Wife passed away due to brain aneurysm.    INTERVAL HISTORY Verdell [name]. is a 75 y.o. male who has above history reviewed by me today presents for follow up visit for multiple myeloma Problems and complaints are listed below:  Patient reports feeling tired.  Chronic lower extremity swelling.  Chronic diarrhea.  Patient reports that has a loose bowel movement almost after every meal. Past Medical History:  Diagnosis Date   Angina pectoris ([place])    Anxiety    Arthritis    CHF (congestive heart failure) ([place])    Coronary artery disease    Depression    Diabetes mellitus without complication ([place])    Patient takes Metformin.   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 WD SCC with superficial infiltration. Re-shaved 04/23/2014. Re-shaved 09/05/2014.   Squamous cell carcinoma of skin 09/08/2020   Left ant scalp. WD SCC.   Squamous cell carcinoma of skin 02/04/2021   R hand dorsum, EDC     Past Surgical History:  Procedure Laterality Date   CARDIAC CATHETERIZATION     CARPAL TUNNEL RELEASE     CATARACT EXTRACTION     COLONOSCOPY WITH PROPOFOL N/A 11/11/2015   Procedure: COLONOSCOPY WITH PROPOFOL;  Surgeon:  Lollie Sails, MD;  Location: [facility] ENDOSCOPY;  Service: Endoscopy;  Laterality: N/A;   CORONARY ARTERY BYPASS GRAFT     EYE SURGERY Bilateral    Cataract Extraction   INGUINAL HERNIA REPAIR     JOINT REPLACEMENT Right 21-Jan-2007   Right Total Hip Replacement   PILONIDAL CYST EXCISION     ROTATOR CUFF REPAIR     TOTAL HIP ARTHROPLASTY Right    VENTRAL HERNIA REPAIR N/A 08/15/2015   Procedure: VENTRAL HERNIA REPAIR WITH MESH ;  Surgeon: Leonie Green, MD;  Location: [facility] ORS;  Service: General;  Laterality: N/A;    Family History  Problem Relation Age of Onset   Heart disease Father    Stroke Mother    Prostate cancer Maternal Grandfather 56    Social History:  reports that he quit smoking about 21 years ago. His smoking use included cigarettes. He has a 60.00 pack-year smoking history. He quit smokeless tobacco use about 21 years ago. He reports that he does not drink alcohol and does not use drugs.  Wife passed away in 2019-01-21.  Allergies:  Allergies  Allergen Reactions   Pravachol [Pravastatin]    Pravastatin Sodium Other (See Comments)   Statins Other (See Comments)    Muscle aches   Zocor [Simvastatin] Other (See Comments)    Muscle aches    Current Medications: Current Outpatient Medications  Medication Sig Dispense Refill   ALPRAZolam (XANAX) 0.25 MG tablet Take 0.25 mg by mouth at bedtime as needed. for sleep  3   aspirin 81 MG tablet Take 81 mg by mouth daily.     Bee Pollen 500 MG CHEW Chew 1 tablet by mouth 2 (two) times daily.     Calcium Carbonate-Vitamin D 600-400 MG-UNIT chew tablet Chew 1 tablet by mouth 2 (two) times daily.      Cyanocobalamin (VITAMIN B-12 IJ) Inject 1 Dose as directed every 30 (thirty) days.      ergocalciferol (VITAMIN D2) 50000 UNITS capsule Take 50,000 Units by mouth once a week. Monday     furosemide (LASIX) 40 MG tablet Take by mouth.     glucose blood test strip USE ONCE DAILY. USE AS INSTRUCTED. DX E11.9     lovastatin (MEVACOR) 40  MG tablet Take 40 mg by mouth at bedtime.      metFORMIN (GLUCOPHAGE) 500 MG tablet Take 500 mg by mouth daily with breakfast.     omeprazole (PRILOSEC) 20 MG capsule Take 20 mg by mouth daily.     PARoxetine (PAXIL) 10 MG tablet Take 10 mg by mouth daily.     propranolol (INDERAL) 10 MG tablet Hold due to low heart rate.     ascorbic acid (VITAMIN C) 500 MG tablet Take 1 tablet (500 mg total) by mouth daily. (Patient not taking: No sig reported) 90 tablet 0   cephALEXin (KEFLEX) 500 MG capsule Take 500 mg by mouth 3 (three) times daily. (Patient not taking: No sig reported)     ipratropium-albuterol (DUONEB) 0.5-2.5 (3) MG/3ML SOLN Take 3 mLs by nebulization every 6 (six) hours as needed. (Patient not taking: No sig reported)     lidocaine (LIDODERM) 5 % Place 1 patch onto the skin every 12 (twelve) hours. Remove & Discard patch within 12 hours or as directed by MD (Patient not taking: No sig reported) 10 patch 0   losartan (COZAAR) 25 MG tablet Take 25 mg by mouth daily.  (Patient not taking: Reported on 08/19/2021)     traMADol (ULTRAM) 50 MG tablet TAKE 1 TABLET BY MOUTH THREE TIMES A DAY AS NEEDED (Patient not taking: No sig reported) 60 tablet 0   No current facility-administered medications for this visit.    Review of Systems  Constitutional:  Positive for malaise/fatigue. Negative for chills, diaphoresis, fever and weight loss (up 8 lbs).  HENT:  Negative for congestion, ear discharge, ear pain, hearing loss, nosebleeds, sinus pain, sore throat and tinnitus.   Eyes:  Positive for blurred vision.  Respiratory:  Negative for cough, hemoptysis, sputum production and shortness of breath.        Sleep apnea, has CPAP, not using it  Cardiovascular:  Positive for leg swelling. Negative for chest pain and palpitations.  Gastrointestinal:  Negative for abdominal pain, blood in stool, constipation, diarrhea, heartburn, melena, nausea and vomiting.  Genitourinary:  Negative for dysuria,  frequency, hematuria and urgency.  Musculoskeletal:  Negative for back pain (spinal stenosis), joint pain, myalgias and neck pain.  Skin:  Negative for itching and rash.  Neurological:  Positive for weakness. Negative for dizziness, tingling, sensory change and headaches.  Endo/Heme/Allergies:  Does not bruise/bleed easily.       Diabetes  Psychiatric/Behavioral:  Negative for depression and memory loss. The patient is not nervous/anxious and does not have insomnia.   All other systems reviewed and are negative. Performance status (ECOG): 1  Vitals Blood pressure 127/75, pulse 90, temperature 98 F (36.7 C), resp. rate 16, weight 195 lb 8 oz (88.7 kg).   Physical Exam Vitals and nursing note reviewed.  Constitutional:      General: He is not in acute distress.    Appearance: He is not diaphoretic.  HENT:     Head: Normocephalic and atraumatic.     Mouth/Throat:     Mouth: Mucous membranes are moist.     Pharynx: Oropharynx is clear.  Eyes:     General: No scleral icterus.    Extraocular Movements: Extraocular movements intact.     Conjunctiva/sclera: Conjunctivae normal.     Pupils: Pupils are equal, round, and reactive to light.  Cardiovascular:     Rate and Rhythm: Normal rate and regular rhythm.     Heart sounds: Normal heart sounds. No murmur heard. Pulmonary:     Effort: Pulmonary effort is normal. No respiratory distress.     Breath sounds: Normal breath sounds. No wheezing or rales.  Chest:     Chest wall: No tenderness.  Abdominal:     General: Bowel sounds are normal. There is no distension.     Palpations: Abdomen is soft. There is no mass.     Tenderness: There is no abdominal tenderness. There is no guarding or rebound.  Musculoskeletal:        General: No swelling or tenderness. Normal range of motion.     Cervical back: Normal range of motion and neck supple.     Right lower leg: Edema (1+) present.     Left lower leg: Edema (1+) present.  Lymphadenopathy:      Head:     Right side of head: No preauricular, posterior auricular or occipital adenopathy.     Left side of head: No preauricular, posterior auricular or occipital adenopathy.     Cervical: No cervical adenopathy.     Upper Body:     Right upper body: No supraclavicular or axillary adenopathy.     Left upper body: No supraclavicular or axillary adenopathy.     Lower Body: No right inguinal adenopathy. No left inguinal adenopathy.  Skin:    General: Skin is warm and dry.     Comments: Lesion on scalp.   Neurological:     Mental Status: He is alert and oriented to person, place, and time.  Psychiatric:        Behavior: Behavior normal.        Thought Content: Thought content normal.        Judgment: Judgment normal.       No visits with results within 3 Day(s) from this visit.  Latest known visit with results is:  Infusion on 07/24/2021  Component Date Value Ref Range Status   Sodium 07/24/2021 135  135 - 145 mmol/L Final   Potassium 07/24/2021 3.9  3.5 - 5.1 mmol/L Final   Chloride 07/24/2021 103  98 - 111 mmol/L Final   CO2 07/24/2021 21 (A) 22 - 32 mmol/L Final   Glucose, Bld 07/24/2021 98  70 - 99 mg/dL Final   Glucose reference range applies only to samples taken after fasting for at least 8 hours.   BUN 07/24/2021 28 (A) 8 - 23  mg/dL Final   Creatinine, Ser 07/24/2021 1.28 (A) 0.61 - 1.24 mg/dL Final   Calcium 07/24/2021 8.8 (A) 8.9 - 10.3 mg/dL Final   Total Protein 07/24/2021 7.4  6.5 - 8.1 g/dL Final   Albumin 07/24/2021 3.3 (A) 3.5 - 5.0 g/dL Final   AST 07/24/2021 34  15 - 41 U/L Final   ALT 07/24/2021 15  0 - 44 U/L Final   Alkaline Phosphatase 07/24/2021 56  38 - 126 U/L Final   Total Bilirubin 07/24/2021 1.3 (A) 0.3 - 1.2 mg/dL Final   GFR, Estimated 07/24/2021 58 (A) >60 mL/min Final   Comment: (NOTE) Calculated using the CKD-EPI Creatinine Equation (2021)    Anion gap 07/24/2021 11  5 - 15 Final   Performed at [facility], [address], [place], [place] [address]   WBC 07/24/2021 4.2  4.0 - 10.5 K/uL Final   RBC 07/24/2021 3.25 (A) 4.22 - 5.81 MIL/uL Final   Hemoglobin 07/24/2021 11.9 (A) 13.0 - 17.0 g/dL Final   HCT 07/24/2021 35.5 (A) 39.0 - 52.0 % Final   MCV 07/24/2021 109.2 (A) 80.0 - 100.0 fL Final   MCH 07/24/2021 36.6 (A) 26.0 - 34.0 pg Final   MCHC 07/24/2021 33.5  30.0 - 36.0 g/dL Final   RDW 07/24/2021 15.0  11.5 - 15.5 % Final   Platelets 07/24/2021 148 (A) 150 - 400 K/uL Final   nRBC 07/24/2021 0.0  0.0 - 0.2 % Final   Neutrophils Relative % 07/24/2021 54  % Final   Neutro Abs 07/24/2021 2.3  1.7 - 7.7 K/uL Final   Lymphocytes Relative 07/24/2021 28  % Final   Lymphs Abs 07/24/2021 1.2  0.7 - 4.0 K/uL Final   Monocytes Relative 07/24/2021 13  % Final   Monocytes Absolute 07/24/2021 0.5  0.1 - 1.0 K/uL Final   Eosinophils Relative 07/24/2021 3  % Final   Eosinophils Absolute 07/24/2021 0.1  0.0 - 0.5 K/uL Final   Basophils Relative 07/24/2021 1  % Final   Basophils Absolute 07/24/2021 0.1  0.0 - 0.1 K/uL Final   Immature Granulocytes 07/24/2021 1  % Final   Abs Immature Granulocytes 07/24/2021 0.02  0.00 - 0.07 K/uL Final   Performed at [facility], [address], [place], [place] [address]   IgG (Immunoglobin G), Serum 07/24/2021 1,410  603 - 1,613 mg/dL Final   IgA 07/24/2021 993 (A) 61 - 437 mg/dL Final   Comment: (NOTE) Results confirmed on dilution.    IgM (Immunoglobulin M), Srm 07/24/2021 62  15 - 143 mg/dL Final   Total Protein ELP 07/24/2021 6.7  6.0 - 8.5 g/dL Corrected   Albumin SerPl Elph-Mcnc 07/24/2021 3.1  2.9 - 4.4 g/dL Corrected   Alpha 1 07/24/2021 0.3  0.0 - 0.4 g/dL Corrected   Alpha2 Glob SerPl Elph-Mcnc 07/24/2021 0.6  0.4 - 1.0 g/dL Corrected   B-Globulin SerPl Elph-Mcnc 07/24/2021 1.3  0.7 - 1.3 g/dL Corrected   Gamma Glob SerPl Elph-Mcnc 07/24/2021 1.5  0.4 - 1.8 g/dL Corrected   M Protein SerPl Elph-Mcnc 07/24/2021 0.3 (A) Not Observed g/dL Corrected    Globulin, Total 07/24/2021 3.6  2.2 - 3.9 g/dL Corrected   Albumin/Glob SerPl 07/24/2021 0.9  0.7 - 1.7 Corrected   IFE 1 07/24/2021 Comment (A)  Corrected   Comment: (NOTE) Immunofixation shows IgG monoclonal protein with lambda light chain specificity. Polyclonal increase detected in one or more immunoglobulins. Monoclonal bands detected are faint. Suggest retesting in 4-6  months.    Please Note 07/24/2021 Comment   Corrected   Comment: (NOTE) Protein electrophoresis scan will follow via computer, mail, or courier delivery. Performed At: [facility] [place], [place] [id number] Rush Farmer MD NO:[id number]    Kappa free light chain 07/24/2021 74.2 (A) 3.3 - 19.4 mg/L Final   Lambda free light chains 07/24/2021 42.8 (A) 5.7 - 26.3 mg/L Final   Kappa, lambda light chain ratio 07/24/2021 1.73 (A) 0.26 - 1.65 Final   Comment: (NOTE) Performed At: [facility] [place], [place] [id number] Rush Farmer MD KC:[id number]    Vitamin B-12 07/24/2021 1,060 (A) 180 - 914 pg/mL Final   Comment: (NOTE) This assay is not validated for testing neonatal or myeloproliferative syndrome specimens for Vitamin B12 levels. Performed at [facility], [place] [address], [place], [place] [address]    Folate 07/24/2021 15.6  >5.9 ng/mL Final   Performed at [facility] [address] [place], [place] [address]    Assessment and plan 1. Multiple myeloma not having achieved remission ([place])   2. Anemia, unspecified type   3. Squamous cell skin cancer   4. B12 deficiency   5. Renal insufficiency     #Stage III multiple myeloma Labs are reviewed and discussed with patient M protein has increased 0.3 during interval.  And a free light chain ratio has increased to 1.73. Possible early recurrence. Patient has not had repeat biopsy since his initial diagnosis. Discussed with him about a repeat biopsy of bone marrow and he agrees with the  plan. Future plan includes possible restarting him on low-dose Revlimid-diarrhea can be an issue.  Consider other agents, for instance, Daratumumab. Obtain bone survey   Chronic diarrhea, refer to gastroenterology for work-up.  History of vitamin B12 deficiency.  B12 level was high at 1060.  I will hold off B12 injection at this point.  #Chronic macrocytic anemia, bone marrow biopsy.  #Chronic kidney disease, Avoid nephrotoxin.  Worse kidney function.  Encourage oral hydration.  # Squamous cell carcinoma of the scalp, status post radiation.  Continue follow-up with dermatology.    RTC 2 weeks after bone marrow biopsy to review results. I discussed the assessment and treatment plan with the patient.  The patient was provided an opportunity to ask questions and all were answered.  The patient agreed with the plan and demonstrated an understanding of the instructions.  The patient was advised to call back if the symptoms worsen or if the condition fails to improve as anticipated.   A total of 45 minutes was spent on this visit.  With 20 minutes spent reviewing previous medical including image findings, pathology reports, 20 minutes counseling the patient on the diagnosis, surveillance plan. additional 5 minutes was spent on answering patient's questions.  Earlie Server, MD, PhD Hematology Oncology [place] at [facility] 08/19/2021

## 2021-08-19 NOTE — Progress Notes (Signed)
Patient feels that his weakness is worse on some days with today being a bad day.

## 2021-08-19 NOTE — Telephone Encounter (Signed)
Order for bone marrow biopsy entered and scheduling request faxed to specialty scheduling.  Patient will f/u with Dr. Tasia Catchings 2 weeks after bone marrow biopsy

## 2021-08-19 NOTE — Telephone Encounter (Signed)
Luis [name] is scheduled for CT BM bx on 10/18 @ 9am with arrival time of 8am   Please schedule patient for MD follow up 2 weeks after BM bx. Pt will also need skeletal survey (walk in). I will call him with appt details.

## 2021-08-20 ENCOUNTER — Encounter: Payer: Self-pay | Admitting: Hematology and Oncology

## 2021-08-20 NOTE — Telephone Encounter (Signed)
Uanble to reach pt by phone multiple times. Detailed VM and appt reminder letter sent.

## 2021-08-20 NOTE — Telephone Encounter (Signed)
Received call from Nancee Liter, stating that biospy appt had to be changed to 10/19 @ 9 am with arrival time of 8am. This due to limited availability. Will send letter with updated appt reminders.

## 2021-08-21 ENCOUNTER — Telehealth: Payer: Self-pay

## 2021-08-21 NOTE — Telephone Encounter (Signed)
Called patient, Luis [name], Luis [name]. and briefly spoke with him regarding scheduled procedure of bone marrow biopsy on 08/26/2021 and to arrive at [facility] at 0800, NPO past midnight night before procedure, hold Metformin  Need of responsible driver to drive home, Comfortable clothing and patient verbalized again the arrival time of 0800, and verbalized that he would like another call on Monday 08/24/2021 to review instructions again.  Questions answered.

## 2021-08-24 ENCOUNTER — Other Ambulatory Visit: Payer: Self-pay | Admitting: Radiology

## 2021-08-24 ENCOUNTER — Other Ambulatory Visit: Payer: Self-pay | Admitting: Physician Assistant

## 2021-08-25 ENCOUNTER — Ambulatory Visit: Payer: Medicare Other

## 2021-08-26 ENCOUNTER — Other Ambulatory Visit: Payer: Self-pay

## 2021-08-26 ENCOUNTER — Ambulatory Visit
Admission: RE | Admit: 2021-08-26 | Discharge: 2021-08-26 | Disposition: A | Discharge status: Home / Self Care | Payer: Medicare Other | Source: Ambulatory Visit | Attending: Oncology | Admitting: Oncology

## 2021-08-26 DIAGNOSIS — E118 Type 2 diabetes mellitus with unspecified complications: Secondary | ICD-10-CM | POA: Insufficient documentation

## 2021-08-26 DIAGNOSIS — C9000 Multiple myeloma not having achieved remission: Secondary | ICD-10-CM | POA: Insufficient documentation

## 2021-08-26 DIAGNOSIS — D61818 Other pancytopenia: Secondary | ICD-10-CM | POA: Insufficient documentation

## 2021-08-26 LAB — CBC WITH DIFFERENTIAL/PLATELET
Abs Immature Granulocytes: 0.01 10*3/uL (ref 0.00–0.07)
Basophils Absolute: 0 10*3/uL (ref 0.0–0.1)
Basophils Relative: 1 %
Eosinophils Absolute: 0.2 10*3/uL (ref 0.0–0.5)
Eosinophils Relative: 5 %
HCT: 35.9 % — ABNORMAL LOW (ref 39.0–52.0)
Hemoglobin: 12.3 g/dL — ABNORMAL LOW (ref 13.0–17.0)
Immature Granulocytes: 0 %
Lymphocytes Relative: 27 %
Lymphs Abs: 0.9 10*3/uL (ref 0.7–4.0)
MCH: 36.8 pg — ABNORMAL HIGH (ref 26.0–34.0)
MCHC: 34.3 g/dL (ref 30.0–36.0)
MCV: 107.5 fL — ABNORMAL HIGH (ref 80.0–100.0)
Monocytes Absolute: 0.5 10*3/uL (ref 0.1–1.0)
Monocytes Relative: 15 %
Neutro Abs: 1.7 10*3/uL (ref 1.7–7.7)
Neutrophils Relative %: 52 %
Platelets: 134 10*3/uL — ABNORMAL LOW (ref 150–400)
RBC: 3.34 MIL/uL — ABNORMAL LOW (ref 4.22–5.81)
RDW: 15.3 % (ref 11.5–15.5)
WBC: 3.3 10*3/uL — ABNORMAL LOW (ref 4.0–10.5)
nRBC: 0 % (ref 0.0–0.2)

## 2021-08-26 LAB — GLUCOSE, CAPILLARY: Glucose-Capillary: 83 mg/dL (ref 70–99)

## 2021-08-26 MED ORDER — HEPARIN SOD (PORK) LOCK FLUSH 100 UNIT/ML IV SOLN
INTRAVENOUS | Status: AC
  Filled 2021-08-26: qty 5

## 2021-08-26 MED ORDER — SODIUM CHLORIDE 0.9 % IV SOLN: INTRAVENOUS | Status: DC

## 2021-08-26 NOTE — Procedures (Signed)
Pre-procedure Diagnosis: Multiple myeloma. Post-procedure Diagnosis: Same  Technically successful CT guided bone marrow aspiration and biopsy of left iliac crest.   Complications: None Immediate  EBL: None  Signed: Sandi Mariscal Pager: [phone] 08/26/2021, 9:37 AM

## 2021-09-07 ENCOUNTER — Encounter (HOSPITAL_COMMUNITY): Payer: Self-pay | Admitting: Oncology

## 2021-09-07 ENCOUNTER — Emergency Department: Payer: Medicare Other

## 2021-09-07 ENCOUNTER — Emergency Department
Admission: EM | Admit: 2021-09-07 | Discharge: 2021-09-07 | Disposition: A | Discharge status: Home / Self Care | Payer: Medicare Other | Attending: Emergency Medicine | Admitting: Emergency Medicine

## 2021-09-07 ENCOUNTER — Other Ambulatory Visit: Payer: Self-pay

## 2021-09-07 ENCOUNTER — Ambulatory Visit: Payer: Medicare Other | Admitting: Dermatology

## 2021-09-07 DIAGNOSIS — R197 Diarrhea, unspecified: Secondary | ICD-10-CM | POA: Insufficient documentation

## 2021-09-07 DIAGNOSIS — R0602 Shortness of breath: Secondary | ICD-10-CM | POA: Insufficient documentation

## 2021-09-07 DIAGNOSIS — R2243 Localized swelling, mass and lump, lower limb, bilateral: Secondary | ICD-10-CM | POA: Insufficient documentation

## 2021-09-07 DIAGNOSIS — Z5321 Procedure and treatment not carried out due to patient leaving prior to being seen by health care provider: Secondary | ICD-10-CM | POA: Diagnosis not present

## 2021-09-07 LAB — CBC WITH DIFFERENTIAL/PLATELET
Abs Immature Granulocytes: 0.02 10*3/uL (ref 0.00–0.07)
Basophils Absolute: 0.1 10*3/uL (ref 0.0–0.1)
Basophils Relative: 1 %
Eosinophils Absolute: 0.2 10*3/uL (ref 0.0–0.5)
Eosinophils Relative: 3 %
HCT: 34 % — ABNORMAL LOW (ref 39.0–52.0)
Hemoglobin: 11.8 g/dL — ABNORMAL LOW (ref 13.0–17.0)
Immature Granulocytes: 0 %
Lymphocytes Relative: 18 %
Lymphs Abs: 0.8 10*3/uL (ref 0.7–4.0)
MCH: 38.8 pg — ABNORMAL HIGH (ref 26.0–34.0)
MCHC: 34.7 g/dL (ref 30.0–36.0)
MCV: 111.8 fL — ABNORMAL HIGH (ref 80.0–100.0)
Monocytes Absolute: 0.7 10*3/uL (ref 0.1–1.0)
Monocytes Relative: 15 %
Neutro Abs: 2.8 10*3/uL (ref 1.7–7.7)
Neutrophils Relative %: 63 %
Platelets: 143 10*3/uL — ABNORMAL LOW (ref 150–400)
RBC: 3.04 MIL/uL — ABNORMAL LOW (ref 4.22–5.81)
RDW: 16.3 % — ABNORMAL HIGH (ref 11.5–15.5)
WBC: 4.5 10*3/uL (ref 4.0–10.5)
nRBC: 0.4 % — ABNORMAL HIGH (ref 0.0–0.2)

## 2021-09-07 LAB — COMPREHENSIVE METABOLIC PANEL
ALT: 15 U/L (ref 0–44)
AST: 35 U/L (ref 15–41)
Albumin: 3 g/dL — ABNORMAL LOW (ref 3.5–5.0)
Alkaline Phosphatase: 81 U/L (ref 38–126)
Anion gap: 9 (ref 5–15)
BUN: 32 mg/dL — ABNORMAL HIGH (ref 8–23)
CO2: 27 mmol/L (ref 22–32)
Calcium: 8.7 mg/dL — ABNORMAL LOW (ref 8.9–10.3)
Chloride: 105 mmol/L (ref 98–111)
Creatinine, Ser: 1.18 mg/dL (ref 0.61–1.24)
GFR, Estimated: >60 mL/min (ref >60)
Glucose, Bld: 115 mg/dL — ABNORMAL HIGH (ref 70–99)
Potassium: 3.3 mmol/L — ABNORMAL LOW (ref 3.5–5.1)
Sodium: 141 mmol/L (ref 135–145)
Total Bilirubin: 1.5 mg/dL — ABNORMAL HIGH (ref 0.3–1.2)
Total Protein: 7.4 g/dL (ref 6.5–8.1)

## 2021-09-07 LAB — BRAIN NATRIURETIC PEPTIDE: B Natriuretic Peptide: 1689.2 pg/mL — ABNORMAL HIGH (ref 0.0–100.0)

## 2021-09-07 LAB — TROPONIN I (HIGH SENSITIVITY): Troponin I (High Sensitivity): 56 ng/L — ABNORMAL HIGH (ref <18)

## 2021-09-07 NOTE — ED Provider Notes (Signed)
Emergency Medicine Provider Triage Evaluation Note  Luis [name]. , a 75 y.o. male  was evaluated in triage.  Pt complains of progressively worsening shortness of breath for the past 2 weeks.  Patient is accompanied by his son who reports that patient is not going downstairs to eat due to shortness of breath.  Patient has a history of chronic diastolic CHF, paroxysmal A. fib, multiple myeloma and coronary artery disease.  He denies cough or fever at home.  Denies current chest pain or chest tightness.  Denies recent weight gain or paroxysmal nocturnal dyspnea.  Review of Systems  Positive: Patient has shortness of breath.  Negative: No chest tightness or chest pain.   Physical Exam  BP 101/90   Pulse 71   Temp 98.1 F (36.7 C) (Oral)   Resp (!) 22   Ht _0  (1.702 m)   Wt 83.9 kg   SpO2 93%   BMI 28.98 kg/m  Gen:   Awake, no distress   Resp:  Normal effort  MSK:   Moves extremities without difficulty  Other:    Medical Decision Making  Medically screening exam initiated at 5:16 PM.  Appropriate orders placed.  Luis [name]. was informed that the remainder of the evaluation will be completed by another provider, this initial triage assessment does not replace that evaluation, and the importance of remaining in the ED until their evaluation is complete.     Vallarie Mare [place], PA-C 09/07/21 1718    Duffy Bruce, MD 09/08/21 1500

## 2021-09-07 NOTE — ED Triage Notes (Signed)
Pt to ED for shob for the past few months with exertion. Reports swelling and leaking fluid BLE.  +diarrhea.

## 2021-09-08 ENCOUNTER — Telehealth: Payer: Self-pay | Admitting: Oncology

## 2021-09-08 ENCOUNTER — Inpatient Hospital Stay: Payer: Medicare Other | Admitting: Oncology

## 2021-09-08 NOTE — Telephone Encounter (Signed)
Patient states that he is too weak to come in today, he went to the ED due to SOB weakness and fatigue and after waiting 5 hours was told it would be another 12 hours before he was seen.  Dr. Tasia Catchings instructed him to call his PCP and get direction as symptoms are not related to his myeloma.   Patient agreed and his appointments were rescheduled to 11/7 at 1pm, patient confirmed.

## 2021-09-08 NOTE — Telephone Encounter (Signed)
Pt not feeling well and wants to cancel appt. Call back at [phone]

## 2021-09-14 ENCOUNTER — Encounter: Payer: Self-pay | Admitting: Oncology

## 2021-09-14 ENCOUNTER — Inpatient Hospital Stay: Payer: Medicare Other | Attending: Oncology | Admitting: Oncology

## 2021-09-14 ENCOUNTER — Inpatient Hospital Stay: Payer: Medicare Other | Admitting: Oncology

## 2021-09-14 ENCOUNTER — Other Ambulatory Visit: Payer: Self-pay

## 2021-09-14 DIAGNOSIS — C9000 Multiple myeloma not having achieved remission: Secondary | ICD-10-CM

## 2021-09-14 DIAGNOSIS — R0602 Shortness of breath: Secondary | ICD-10-CM

## 2021-09-14 NOTE — Progress Notes (Signed)
Patient states he is having shortness of breathe, and overall weak for the last 4-5 weeks now. Patient is getting shortness of breathe with little task like making a sandwich and when he gets short of breathe he has neck pain. Patient states he has little air bubble in his legs and has clear liquid leaking from them.

## 2021-09-14 NOTE — Progress Notes (Signed)
HEMATOLOGY-ONCOLOGY TeleHEALTH VISIT PROGRESS NOTE  I connected with Luis [name]. on 09/14/21  at  1:00 PM EST by telephone visit and verified that I am speaking with the correct person using two identifiers. I discussed the limitations, risks, security and privacy concerns of performing an evaluation and management service by telemedicine and the availability of in-person appointments. The patient expressed understanding and agreed to proceed.   Other persons participating in the visit and their role in the encounter:  None  Patient's location: Home  Provider's location: office Chief Complaint: Follow-up for bone marrow biopsy results reviewed. Patient reports  INTERVAL HISTORY Luis [name]. is a 75 y.o. male who has above history reviewed by me today presents for follow up visit for review of bone marrow biopsy results. Patient reports not feeling well today.  He has more shortness of breath.  He has also noticed some neck pain as well as lower extremity swelling and fluid leaking.  He presented emergency room last week and had some blood work done.  BNP was elevated at 1600s.  Shortness of breath is worse with exertion. Patient left emergency room without being seen.  Today he has an echocardiogram scheduled.  Review of Systems  Constitutional:  Positive for fatigue. Negative for appetite change, chills, fever and unexpected weight change.  HENT:   Negative for hearing loss and voice change.   Eyes:  Negative for eye problems and icterus.  Respiratory:  Positive for shortness of breath. Negative for chest tightness and cough.   Cardiovascular:  Positive for leg swelling. Negative for chest pain.  Gastrointestinal:  Negative for abdominal distention and abdominal pain.  Endocrine: Negative for hot flashes.  Genitourinary:  Negative for difficulty urinating, dysuria and frequency.   Musculoskeletal:  Positive for neck pain. Negative for arthralgias.  Skin:  Negative for itching and  rash.  Neurological:  Negative for light-headedness and numbness.  Hematological:  Negative for adenopathy. Does not bruise/bleed easily.  Psychiatric/Behavioral:  Negative for confusion.    Past Medical History:  Diagnosis Date   Angina pectoris ([place])    Anxiety    Arthritis    CHF (congestive heart failure) ([place])    Coronary artery disease    Depression    Diabetes mellitus without complication ([place])    Patient takes Metformin.   Diverticulosis    GERD (gastroesophageal reflux disease)    Hyperlipidemia    Hypertension    Multiple myeloma ([place])    Multiple myeloma ([place]) 03/27/2015   Myocardial infarction [facility]) April 2001   widowmaker   Shortness of breath dyspnea    Sleep apnea    No CPAP @ present   Spinal stenosis    Squamous cell carcinoma of skin 02/19/2014   Left dorsal hand. WD SCC with superficial infiltration. Re-shaved 04/23/2014. Re-shaved 09/05/2014.   Squamous cell carcinoma of skin 09/08/2020   Left ant scalp. WD SCC.   Squamous cell carcinoma of skin 02/04/2021   R hand dorsum, EDC    Past Surgical History:  Procedure Laterality Date   CARDIAC CATHETERIZATION     CARPAL TUNNEL RELEASE     CATARACT EXTRACTION     COLONOSCOPY WITH PROPOFOL N/A 11/11/2015   Procedure: COLONOSCOPY WITH PROPOFOL;  Surgeon: Lollie Sails, MD;  Location: [facility] ENDOSCOPY;  Service: Endoscopy;  Laterality: N/A;   CORONARY ARTERY BYPASS GRAFT     EYE SURGERY Bilateral    Cataract Extraction   INGUINAL HERNIA REPAIR     JOINT REPLACEMENT  Right 2008   Right Total Hip Replacement   PILONIDAL CYST EXCISION     ROTATOR CUFF REPAIR     TOTAL HIP ARTHROPLASTY Right    VENTRAL HERNIA REPAIR N/A 08/15/2015   Procedure: VENTRAL HERNIA REPAIR WITH MESH ;  Surgeon: Leonie Green, MD;  Location: [facility] ORS;  Service: General;  Laterality: N/A;    Family History  Problem Relation Age of Onset   Heart disease Father    Stroke Mother    Prostate cancer Maternal Grandfather 36     Social History   Socioeconomic History   Marital status: Married    Spouse name: Not on file   Number of children: Not on file   Years of education: Not on file   Highest education level: Not on file  Occupational History   Not on file  Tobacco Use   Smoking status: Former    Packs/day: 1.50    Years: 40.00    Pack years: 60.00    Types: Cigarettes    Quit date: 02/24/2000    Years since quitting: 21.5   Smokeless tobacco: Former    Quit date: 03/04/2000  Vaping Use   Vaping Use: Never used  Substance and Sexual Activity   Alcohol use: No   Drug use: No   Sexual activity: Not on file  Other Topics Concern   Not on file  Social History Narrative   Not on file   Social Determinants of Health   Financial Resource Strain: Not on file  Food Insecurity: Not on file  Transportation Needs: Not on file  Physical Activity: Not on file  Stress: Not on file  Social Connections: Not on file  Intimate Partner Violence: Not on file    Current Outpatient Medications on File Prior to Visit  Medication Sig Dispense Refill   ALPRAZolam (XANAX) 0.25 MG tablet Take 0.25 mg by mouth at bedtime as needed. for sleep  3   aspirin 81 MG tablet Take 81 mg by mouth daily.     Bee Pollen 500 MG CHEW Chew 1 tablet by mouth 2 (two) times daily.     Calcium Carbonate-Vitamin D 600-400 MG-UNIT chew tablet Chew 1 tablet by mouth 2 (two) times daily.      Cyanocobalamin (VITAMIN B-12 IJ) Inject 1 Dose as directed every 30 (thirty) days.      ergocalciferol (VITAMIN D2) 50000 UNITS capsule Take 50,000 Units by mouth once a week. Monday     furosemide (LASIX) 40 MG tablet Take by mouth.     glucose blood test strip USE ONCE DAILY. USE AS INSTRUCTED. DX E11.9     lovastatin (MEVACOR) 40 MG tablet Take 40 mg by mouth at bedtime.      omeprazole (PRILOSEC) 20 MG capsule Take 20 mg by mouth daily.     PARoxetine (PAXIL) 10 MG tablet Take 10 mg by mouth daily.     propranolol (INDERAL) 10 MG tablet Hold  due to low heart rate.     traMADol (ULTRAM) 50 MG tablet TAKE 1 TABLET BY MOUTH THREE TIMES A DAY AS NEEDED 60 tablet 0   ascorbic acid (VITAMIN C) 500 MG tablet Take 1 tablet (500 mg total) by mouth daily. (Patient not taking: No sig reported) 90 tablet 0   cephALEXin (KEFLEX) 500 MG capsule Take 500 mg by mouth 3 (three) times daily. (Patient not taking: No sig reported)     ipratropium-albuterol (DUONEB) 0.5-2.5 (3) MG/3ML SOLN Take 3 mLs by nebulization every  6 (six) hours as needed. (Patient not taking: No sig reported)     lidocaine (LIDODERM) 5 % Place 1 patch onto the skin every 12 (twelve) hours. Remove & Discard patch within 12 hours or as directed by MD (Patient not taking: No sig reported) 10 patch 0   losartan (COZAAR) 25 MG tablet Take 25 mg by mouth daily.  (Patient not taking: Reported on 08/19/2021)     metFORMIN (GLUCOPHAGE) 500 MG tablet Take 500 mg by mouth daily with breakfast. (Patient not taking: Reported on 09/14/2021)     No current facility-administered medications on file prior to visit.    Allergies  Allergen Reactions   Pravachol [Pravastatin]    Pravastatin Sodium Other (See Comments)   Statins Other (See Comments)    Muscle aches   Zocor [Simvastatin] Other (See Comments)    Muscle aches       Observations/Objective: Today's Vitals   09/14/21 1207  PainSc: 0-No pain   There is no height or weight on file to calculate BMI.  Physical Exam  CBC    Component Value Date/Time   WBC 4.5 09/07/2021 1712   RBC 3.04 (L) 09/07/2021 1712   HGB 11.8 (L) 09/07/2021 1712   HGB 12.9 (L) 02/17/2015 0934   HCT 34.0 (L) 09/07/2021 1712   HCT 37.7 (L) 02/17/2015 0934   PLT 143 (L) 09/07/2021 1712   PLT 284 02/17/2015 0934   MCV 111.8 (H) 09/07/2021 1712   MCV 104 (H) 02/17/2015 0934   MCH 38.8 (H) 09/07/2021 1712   MCHC 34.7 09/07/2021 1712   RDW 16.3 (H) 09/07/2021 1712   RDW 13.5 02/17/2015 0934   LYMPHSABS 0.8 09/07/2021 1712   LYMPHSABS 0.9 (L)  02/17/2015 0934   MONOABS 0.7 09/07/2021 1712   MONOABS 0.4 02/17/2015 0934   EOSABS 0.2 09/07/2021 1712   EOSABS 0.5 02/17/2015 0934   BASOSABS 0.1 09/07/2021 1712   BASOSABS 0.1 02/17/2015 0934    CMP     Component Value Date/Time   NA 141 09/07/2021 1712   NA 134 (L) 02/17/2015 0934   K 3.3 (L) 09/07/2021 1712   K 4.3 02/17/2015 0934   CL 105 09/07/2021 1712   CL 101 02/17/2015 0934   CO2 27 09/07/2021 1712   CO2 26 02/17/2015 0934   GLUCOSE 115 (H) 09/07/2021 1712   GLUCOSE 120 (H) 02/17/2015 0934   BUN 32 (H) 09/07/2021 1712   BUN 14 02/17/2015 0934   CREATININE 1.18 09/07/2021 1712   CREATININE 0.77 02/17/2015 0934   CALCIUM 8.7 (L) 09/07/2021 1712   CALCIUM 8.6 (L) 02/17/2015 0934   PROT 7.4 09/07/2021 1712   PROT 7.8 10/16/2014 1001   ALBUMIN 3.0 (L) 09/07/2021 1712   ALBUMIN 3.8 02/17/2015 0934   AST 35 09/07/2021 1712   AST 17 10/16/2014 1001   ALT 15 09/07/2021 1712   ALT 26 10/16/2014 1001   ALKPHOS 81 09/07/2021 1712   ALKPHOS 58 10/16/2014 1001   BILITOT 1.5 (H) 09/07/2021 1712   BILITOT 0.5 10/16/2014 1001   GFRNONAA >60 09/07/2021 1712   GFRNONAA >60 02/17/2015 0934   GFRAA >60 07/17/2020 1321   GFRAA >60 02/17/2015 0934     Assessment and Plan: 1. Multiple myeloma not having achieved remission ([place])   2. Shortness of breath     #Multiple myeloma, bone marrow biopsy was reviewed and discussed with patient. Polytypic plasmacytosis with increased lambda population.  Could be early recurrence of lambda restricted plasma neoplasm.  Cytogenetics showed  a normal  male karyotype.  Results reviewed and discussed with patient over the phone. Hold off restarting multiple myeloma treatment at this point.  Continue monitoring.  He agrees with the plan  #Shortness of breath, lower extremity edema.  Elevated BNP. Recommend patient to go to emergency room.  Patient very concerned about the long waiting time he experienced last time.  He is reluctant and not  wanting to go to emergency room for further evaluation at this point  he will follow-up with primary care provider.  He has an echocardiogram scheduled today.   Follow Up Instructions: 3 months   I discussed the assessment and treatment plan with the patient. The patient was provided an opportunity to ask questions and all were answered. The patient agreed with the plan and demonstrated an understanding of the instructions.  The patient was advised to call back or seek an in-person evaluation if the symptoms worsen or if the condition fails to improve as anticipated.   I provided 15 minutes of non face-to-face telephone visit time during this encounter, and > 50% was spent counseling as documented under my assessment & plan.   Earlie Server, MD 09/14/2021 7:57 PM

## 2021-09-15 ENCOUNTER — Telehealth: Payer: Self-pay

## 2021-09-15 NOTE — Telephone Encounter (Signed)
Contacted [facility] pathology and spoke to [facility], who will ask MD to add requested testing to [facility] biopsy [id number]

## 2021-09-15 NOTE — Telephone Encounter (Signed)
-----   Message from Earlie Server, MD sent at 09/14/2021  8:16 PM EST ----- Please ask pathology Thressa Sheller, MD to add myeloma FISH panel on his recent BM biopsy. Thanks.

## 2021-09-21 ENCOUNTER — Encounter (HOSPITAL_COMMUNITY): Payer: Self-pay | Admitting: Oncology

## 2021-09-22 ENCOUNTER — Encounter: Payer: Self-pay | Admitting: Hematology and Oncology

## 2021-09-22 NOTE — Telephone Encounter (Signed)
Results scanned in Media.  

## 2021-10-03 LAB — SURGICAL PATHOLOGY

## 2021-12-01 ENCOUNTER — Other Ambulatory Visit: Payer: Self-pay

## 2021-12-01 ENCOUNTER — Emergency Department: Payer: Medicare Other

## 2021-12-01 ENCOUNTER — Emergency Department
Admission: EM | Admit: 2021-12-01 | Discharge: 2021-12-01 | Disposition: A | Discharge status: Home / Self Care | Payer: Medicare Other | Attending: Emergency Medicine | Admitting: Emergency Medicine

## 2021-12-01 ENCOUNTER — Encounter: Payer: Self-pay | Admitting: Emergency Medicine

## 2021-12-01 DIAGNOSIS — W0110XA Fall on same level from slipping, tripping and stumbling with subsequent striking against unspecified object, initial encounter: Secondary | ICD-10-CM | POA: Insufficient documentation

## 2021-12-01 DIAGNOSIS — E876 Hypokalemia: Secondary | ICD-10-CM | POA: Diagnosis not present

## 2021-12-01 DIAGNOSIS — E119 Type 2 diabetes mellitus without complications: Secondary | ICD-10-CM | POA: Diagnosis not present

## 2021-12-01 DIAGNOSIS — I2510 Atherosclerotic heart disease of native coronary artery without angina pectoris: Secondary | ICD-10-CM | POA: Diagnosis not present

## 2021-12-01 DIAGNOSIS — S0990XA Unspecified injury of head, initial encounter: Secondary | ICD-10-CM

## 2021-12-01 DIAGNOSIS — I110 Hypertensive heart disease with heart failure: Secondary | ICD-10-CM | POA: Diagnosis not present

## 2021-12-01 DIAGNOSIS — I509 Heart failure, unspecified: Secondary | ICD-10-CM | POA: Diagnosis not present

## 2021-12-01 DIAGNOSIS — S0101XA Laceration without foreign body of scalp, initial encounter: Secondary | ICD-10-CM | POA: Insufficient documentation

## 2021-12-01 LAB — BASIC METABOLIC PANEL WITH GFR
Anion gap: 16 — ABNORMAL HIGH (ref 5–15)
BUN: 32 mg/dL — ABNORMAL HIGH (ref 8–23)
CO2: 33 mmol/L — ABNORMAL HIGH (ref 22–32)
Calcium: 9 mg/dL (ref 8.9–10.3)
Chloride: 86 mmol/L — ABNORMAL LOW (ref 98–111)
Creatinine, Ser: 1.96 mg/dL — ABNORMAL HIGH (ref 0.61–1.24)
GFR, Estimated: 35 mL/min — ABNORMAL LOW (ref >60)
Glucose, Bld: 114 mg/dL — ABNORMAL HIGH (ref 70–99)
Potassium: 2.5 mmol/L — CL (ref 3.5–5.1)
Sodium: 135 mmol/L (ref 135–145)

## 2021-12-01 LAB — CBC
HCT: 35.7 % — ABNORMAL LOW (ref 39.0–52.0)
Hemoglobin: 12.4 g/dL — ABNORMAL LOW (ref 13.0–17.0)
MCH: 37.8 pg — ABNORMAL HIGH (ref 26.0–34.0)
MCHC: 34.7 g/dL (ref 30.0–36.0)
MCV: 108.8 fL — ABNORMAL HIGH (ref 80.0–100.0)
Platelets: 177 K/uL (ref 150–400)
RBC: 3.28 MIL/uL — ABNORMAL LOW (ref 4.22–5.81)
RDW: 15 % (ref 11.5–15.5)
WBC: 7.3 K/uL (ref 4.0–10.5)
nRBC: 0 % (ref 0.0–0.2)

## 2021-12-01 LAB — MAGNESIUM: Magnesium: 1.6 mg/dL — ABNORMAL LOW (ref 1.7–2.4)

## 2021-12-01 MED ORDER — POTASSIUM CHLORIDE CRYS ER 20 MEQ PO TBCR
40.0000 meq | EXTENDED_RELEASE_TABLET | Freq: Once | ORAL | Status: AC
  Administered 2021-12-01: 22:00:00 40 meq via ORAL
  Filled 2021-12-01: qty 2

## 2021-12-01 NOTE — ED Triage Notes (Addendum)
Pt to ED via [facility] . Pt was walking inside the house and tripped over doorway. Fall was witnessed by family. Pt states brief LOC. Pt with laceration to top of head and bilateral hands. Pt c/o head pain and hip pain. No rotation noted. Pt RA sats low with EMS and placed on 2L [place]. Oxygen 93% on 2L [place] in triage.

## 2021-12-01 NOTE — ED Provider Triage Note (Signed)
Emergency Medicine Provider Triage Evaluation Note  Luis [name]. , a 76 y.o. male  was evaluated in triage.  Pt complains of head laceration and hand abrasions after mechanical fall at home prior to arrival. Questionable loss of consciousness. Takes blood thinners.  Review of Systems  Positive: Laceration Negative: Neck pain, extremity pain  Physical Exam  Ht 5\' 7"  (1.702 m)    Wt 83.6 kg    BMI 28.87 kg/m  Gen:   Awake, no distress   Resp:  Normal effort  MSK:   Moves extremities without difficulty  Other:    Medical Decision Making  Medically screening exam initiated at 5:58 PM.  Appropriate orders placed.  Luis [name]. was informed that the remainder of the evaluation will be completed by another provider, this initial triage assessment does not replace that evaluation, and the importance of remaining in the ED until their evaluation is complete.    Victorino Dike, FNP 12/01/21 2224

## 2021-12-01 NOTE — ED Notes (Signed)
Blue top sent to lab. 

## 2021-12-01 NOTE — ED Provider Notes (Addendum)
[facility] Provider Note    Event Date/Time   First MD Initiated Contact with Patient 12/01/21 2059     (approximate)   History   Fall   HPI  Luis [name]. is a 76 y.o. male with MH of CHF, CAD, diabetes, hypertension, hyperlipidemia, multiple myeloma, and sleep apnea who presents with a head injury and skin tears to his hands after a mechanical fall.  The patient states that he tripped and fell over a door threshold.  He did not feel dizzy or lightheaded.  He states he hit the front of his head and may have blacked out for 1 to 2 seconds but did not have any sustained loss of consciousness.  He states he otherwise has chronic shortness of breath and generalized weakness but the symptoms have not changed or worsened recently.   Physical Exam   Triage Vital Signs: ED Triage Vitals  Enc Vitals Group     BP 12/01/21 1758 108/63     Pulse Rate 12/01/21 1758 (!) 137     Resp 12/01/21 1758 18     Temp 12/01/21 1758 (!) 97.5 F (36.4 C)     Temp Source 12/01/21 1758 Oral     SpO2 12/01/21 1758 93 %     Weight 12/01/21 1751 184 lb 4.9 oz (83.6 kg)     Height 12/01/21 1751 _0  (1.702 m)     Head Circumference --      Peak Flow --      Pain Score 12/01/21 1800 4     Pain Loc --      Pain Edu? --      Excl. in [place]? --     Most recent vital signs: Vitals:   12/01/21 2249 12/01/21 2250  BP: 112/66 112/66  Pulse: 81 84  Resp: 18   Temp: 98.1 F (36.7 C) 98.1 F (36.7 C)  SpO2:  94%     General: Awake, no distress.  CV:  Good peripheral perfusion. Resp:  Normal effort.  Abd:  No distention.  Other:  1 cm superficial laceration to frontal scalp.  Several tiny superficial skin tears to both hands.  Normal neurologic exam: EOMI.  PERRLA.  No facial droop.  Motor and sensory intact in all extremities.  No ataxia on finger-to-nose.  No pronator drift.   ED Results / Procedures / Treatments   Labs (all labs ordered are listed, but only abnormal  results are displayed) Labs Reviewed  CBC - Abnormal; Notable for the following components:      Result Value   RBC 3.28 (*)    Hemoglobin 12.4 (*)    HCT 35.7 (*)    MCV 108.8 (*)    MCH 37.8 (*)    All other components within normal limits  BASIC METABOLIC PANEL - Abnormal; Notable for the following components:   Potassium 2.5 (*)    Chloride 86 (*)    CO2 33 (*)    Glucose, Bld 114 (*)    BUN 32 (*)    Creatinine, Ser 1.96 (*)    GFR, Estimated 35 (*)    Anion gap 16 (*)    All other components within normal limits  MAGNESIUM - Abnormal; Notable for the following components:   Magnesium 1.6 (*)    All other components within normal limits     EKG  ED ECG REPORT I, Arta Silence, the attending physician, personally viewed and interpreted this ECG.  Date: 12/01/2021  EKG Time: 1802 Rate: 66 Rhythm: normal sinus rhythm QRS Axis: Right axis Intervals: normal ST/T Wave abnormalities: Nonspecific ST abnormalities anterior lateral Narrative Interpretation: Nonspecific abnormalities with no evidence of acute ischemia; no significant change when compared to EKG of 11/08/2020    RADIOLOGY  Chest x-ray interpreted by me shows no focal consolidation or edema  CT head interpreted by me shows no ICH or other acute traumatic findings.  I reviewed the radiology report which confirms no acute abnormality  CT cervical spine interpreted by me shows no ICH or other acute traumatic findings.  I reviewed the radiology report which confirms no acute abnormality  PROCEDURES:  Critical Care performed: No  ..Laceration Repair  Date/Time: 12/01/2021 10:33 PM Performed by: Arta Silence, MD Authorized by: Arta Silence, MD   Consent:    Consent obtained:  Verbal   Consent given by:  Patient   Alternatives discussed:  No treatment Universal protocol:    Patient identity confirmed:  Verbally with patient Anesthesia:    Anesthesia method:  None Laceration details:     Location:  Scalp   Scalp location:  Frontal   Length (cm):  1 Treatment:    Amount of cleaning:  Standard Skin repair:    Repair method:  Tissue adhesive Approximation:    Approximation:  Close Repair type:    Repair type:  Simple Post-procedure details:    Dressing:  Open (no dressing)   Procedure completion:  Tolerated well, no immediate complications   MEDICATIONS ORDERED IN ED: Medications  potassium chloride SA (KLOR-CON M) CR tablet 40 mEq (40 mEq Oral Given 12/01/21 2131)     IMPRESSION / MDM / [place] / ED COURSE  I reviewed the triage vital signs and the nursing notes.  76 year old male with PMH as noted above presents with a minor head injury and scalp laceration after a mechanical fall from standing height.  He denies other acute injuries suffered a few small skin tears to his hands.  He denies any other acute symptoms although he does report chronic shortness of breath and weakness that are unchanged today.  On exam the patient is well-appearing.  He was tachycardic in triage but this has subsequently resolved.  Neurologic exam is normal.  Overall presentation is consistent with minor head injury due to mechanical fall.  The patient did not have any syncope or prodrome.    CT head and C-spine were obtained from triage and are negative for traumatic findings.  Chest x-ray shows no acute abnormality.  The patient reports chronic shortness of breath which is unchanged today.  He is on 2 L of O2 at baseline.  Lab work-up is significant for hypokalemia.  The patient also has mild hypomagnesemia although this is chronic, his chloride is low, and his creatinine is slightly elevated from baseline.  Anion gap is slightly elevated due to these electrolyte abnormalities.  The patient is not hyperglycemic.  However the patient does not have any specific symptoms related to these abnormalities.  I suspect he is somewhat dehydrated.  He states he has been diagnosed  with hypokalemia and after recent labs within the last 2 weeks his PMD increased his dose of potassium although he has not been taking it as consistently as he should be.  I recommended that we start an IV, give fluids and IV potassium and then potentially recheck labs to make sure these abnormalities clear  However, the patient just wants the laceration taken care of and states he feels  fine otherwise.  He declines an IV.  He understands based on my explanation that hypokalemia and the other electrolyte abnormalities can cause complications including, but not limited to, cardiac arrhythmias, muscle weakness, and other acute conditions that can be dangerous or even fatal.  He demonstrates appropriate decision-making capacity to decline these interventions.  The patient agrees to take some p.o. potassium.  He declines a prescription for it since he has one from his doctor already.  He does agree to follow-up with his PMD for repeat labs within the next week.  Laceration was repaired with Dermabond.  The patient is stable for discharge home at this time.  I gave the patient thorough return precautions and he expressed understanding.  I also explained that he may return at any time if he changes his mind and wishes to get IV treatment for the electrolyte abnormalities.    FINAL CLINICAL IMPRESSION(S) / ED DIAGNOSES   Final diagnoses:  Laceration of scalp, initial encounter  Minor head injury, initial encounter  Hypokalemia     Rx / DC Orders   ED Discharge Orders     None        Note:  This document was prepared using Dragon voice recognition software and may include unintentional dictation errors.Merlene Morse, MD 12/01/21 8721    Arta Silence, MD 12/01/21 2306

## 2021-12-01 NOTE — ED Triage Notes (Signed)
Presents via EMS from home  states he tripped and fell  hitting head  skin tears noted to both hands and face

## 2021-12-01 NOTE — Discharge Instructions (Addendum)
Your potassium level is 2.5 today.  Take the increased potassium dose as prescribed by Dr. Ginette Pitman, and follow-up with him within the next week.  Return to the ER for any new or worsening weakness or lightheadedness, palpitations, muscle weakness, cramps, or any other new or worsening symptoms that concern you.

## 2021-12-03 ENCOUNTER — Other Ambulatory Visit: Payer: Self-pay | Admitting: Gastroenterology

## 2021-12-03 DIAGNOSIS — R1313 Dysphagia, pharyngeal phase: Secondary | ICD-10-CM

## 2021-12-09 ENCOUNTER — Encounter: Payer: Self-pay | Admitting: *Deleted

## 2021-12-09 ENCOUNTER — Other Ambulatory Visit: Payer: Self-pay

## 2021-12-09 ENCOUNTER — Inpatient Hospital Stay
Admission: EM | Admit: 2021-12-09 | Discharge: 2021-12-18 | DRG: 871 | Disposition: A | Discharge status: Skilled Nursing Facility | Payer: Medicare Other | Source: Skilled Nursing Facility | Attending: Internal Medicine | Admitting: Internal Medicine

## 2021-12-09 ENCOUNTER — Inpatient Hospital Stay: Payer: Medicare Other

## 2021-12-09 ENCOUNTER — Emergency Department: Payer: Medicare Other

## 2021-12-09 DIAGNOSIS — E1122 Type 2 diabetes mellitus with diabetic chronic kidney disease: Secondary | ICD-10-CM | POA: Diagnosis present

## 2021-12-09 DIAGNOSIS — E871 Hypo-osmolality and hyponatremia: Secondary | ICD-10-CM | POA: Diagnosis not present

## 2021-12-09 DIAGNOSIS — J690 Pneumonitis due to inhalation of food and vomit: Secondary | ICD-10-CM | POA: Diagnosis not present

## 2021-12-09 DIAGNOSIS — R1319 Other dysphagia: Secondary | ICD-10-CM | POA: Diagnosis not present

## 2021-12-09 DIAGNOSIS — A419 Sepsis, unspecified organism: Secondary | ICD-10-CM | POA: Diagnosis present

## 2021-12-09 DIAGNOSIS — R0600 Dyspnea, unspecified: Secondary | ICD-10-CM

## 2021-12-09 DIAGNOSIS — Z20822 Contact with and (suspected) exposure to covid-19: Secondary | ICD-10-CM | POA: Diagnosis present

## 2021-12-09 DIAGNOSIS — N179 Acute kidney failure, unspecified: Secondary | ICD-10-CM | POA: Diagnosis present

## 2021-12-09 DIAGNOSIS — I509 Heart failure, unspecified: Secondary | ICD-10-CM

## 2021-12-09 DIAGNOSIS — M48061 Spinal stenosis, lumbar region without neurogenic claudication: Secondary | ICD-10-CM | POA: Diagnosis present

## 2021-12-09 DIAGNOSIS — J9601 Acute respiratory failure with hypoxia: Secondary | ICD-10-CM | POA: Diagnosis present

## 2021-12-09 DIAGNOSIS — I4891 Unspecified atrial fibrillation: Secondary | ICD-10-CM | POA: Diagnosis present

## 2021-12-09 DIAGNOSIS — J439 Emphysema, unspecified: Secondary | ICD-10-CM | POA: Diagnosis present

## 2021-12-09 DIAGNOSIS — Z515 Encounter for palliative care: Secondary | ICD-10-CM | POA: Diagnosis not present

## 2021-12-09 DIAGNOSIS — L299 Pruritus, unspecified: Secondary | ICD-10-CM | POA: Diagnosis present

## 2021-12-09 DIAGNOSIS — E785 Hyperlipidemia, unspecified: Secondary | ICD-10-CM | POA: Diagnosis not present

## 2021-12-09 DIAGNOSIS — F32A Depression, unspecified: Secondary | ICD-10-CM | POA: Diagnosis present

## 2021-12-09 DIAGNOSIS — I5033 Acute on chronic diastolic (congestive) heart failure: Secondary | ICD-10-CM | POA: Diagnosis present

## 2021-12-09 DIAGNOSIS — Z6830 Body mass index (BMI) 30.0-30.9, adult: Secondary | ICD-10-CM

## 2021-12-09 DIAGNOSIS — I4892 Unspecified atrial flutter: Secondary | ICD-10-CM | POA: Diagnosis present

## 2021-12-09 DIAGNOSIS — L03116 Cellulitis of left lower limb: Secondary | ICD-10-CM | POA: Diagnosis present

## 2021-12-09 DIAGNOSIS — J9811 Atelectasis: Secondary | ICD-10-CM | POA: Diagnosis present

## 2021-12-09 DIAGNOSIS — Z634 Disappearance and death of family member: Secondary | ICD-10-CM

## 2021-12-09 DIAGNOSIS — I25810 Atherosclerosis of coronary artery bypass graft(s) without angina pectoris: Secondary | ICD-10-CM | POA: Diagnosis present

## 2021-12-09 DIAGNOSIS — M549 Dorsalgia, unspecified: Secondary | ICD-10-CM | POA: Diagnosis present

## 2021-12-09 DIAGNOSIS — L03115 Cellulitis of right lower limb: Secondary | ICD-10-CM | POA: Diagnosis present

## 2021-12-09 DIAGNOSIS — I2510 Atherosclerotic heart disease of native coronary artery without angina pectoris: Secondary | ICD-10-CM | POA: Diagnosis not present

## 2021-12-09 DIAGNOSIS — K7031 Alcoholic cirrhosis of liver with ascites: Secondary | ICD-10-CM | POA: Diagnosis not present

## 2021-12-09 DIAGNOSIS — J90 Pleural effusion, not elsewhere classified: Secondary | ICD-10-CM | POA: Diagnosis not present

## 2021-12-09 DIAGNOSIS — E440 Moderate protein-calorie malnutrition: Secondary | ICD-10-CM | POA: Diagnosis present

## 2021-12-09 DIAGNOSIS — K7460 Unspecified cirrhosis of liver: Secondary | ICD-10-CM | POA: Diagnosis present

## 2021-12-09 DIAGNOSIS — I2721 Secondary pulmonary arterial hypertension: Secondary | ICD-10-CM | POA: Diagnosis present

## 2021-12-09 DIAGNOSIS — D649 Anemia, unspecified: Secondary | ICD-10-CM | POA: Diagnosis not present

## 2021-12-09 DIAGNOSIS — L03119 Cellulitis of unspecified part of limb: Secondary | ICD-10-CM | POA: Diagnosis not present

## 2021-12-09 DIAGNOSIS — T502X5A Adverse effect of carbonic-anhydrase inhibitors, benzothiadiazides and other diuretics, initial encounter: Secondary | ICD-10-CM | POA: Diagnosis present

## 2021-12-09 DIAGNOSIS — Z96641 Presence of right artificial hip joint: Secondary | ICD-10-CM | POA: Diagnosis present

## 2021-12-09 DIAGNOSIS — Z7982 Long term (current) use of aspirin: Secondary | ICD-10-CM

## 2021-12-09 DIAGNOSIS — Z8249 Family history of ischemic heart disease and other diseases of the circulatory system: Secondary | ICD-10-CM

## 2021-12-09 DIAGNOSIS — Z66 Do not resuscitate: Secondary | ICD-10-CM | POA: Diagnosis not present

## 2021-12-09 DIAGNOSIS — Z87891 Personal history of nicotine dependence: Secondary | ICD-10-CM

## 2021-12-09 DIAGNOSIS — N1832 Chronic kidney disease, stage 3b: Secondary | ICD-10-CM | POA: Diagnosis present

## 2021-12-09 DIAGNOSIS — F418 Other specified anxiety disorders: Secondary | ICD-10-CM | POA: Diagnosis present

## 2021-12-09 DIAGNOSIS — M4800 Spinal stenosis, site unspecified: Secondary | ICD-10-CM | POA: Diagnosis present

## 2021-12-09 DIAGNOSIS — Z85828 Personal history of other malignant neoplasm of skin: Secondary | ICD-10-CM

## 2021-12-09 DIAGNOSIS — I130 Hypertensive heart and chronic kidney disease with heart failure and stage 1 through stage 4 chronic kidney disease, or unspecified chronic kidney disease: Secondary | ICD-10-CM | POA: Diagnosis present

## 2021-12-09 DIAGNOSIS — Z79899 Other long term (current) drug therapy: Secondary | ICD-10-CM

## 2021-12-09 DIAGNOSIS — D631 Anemia in chronic kidney disease: Secondary | ICD-10-CM | POA: Diagnosis present

## 2021-12-09 DIAGNOSIS — K219 Gastro-esophageal reflux disease without esophagitis: Secondary | ICD-10-CM | POA: Diagnosis present

## 2021-12-09 DIAGNOSIS — R188 Other ascites: Secondary | ICD-10-CM | POA: Diagnosis present

## 2021-12-09 DIAGNOSIS — L989 Disorder of the skin and subcutaneous tissue, unspecified: Secondary | ICD-10-CM | POA: Diagnosis present

## 2021-12-09 DIAGNOSIS — I252 Old myocardial infarction: Secondary | ICD-10-CM

## 2021-12-09 DIAGNOSIS — E119 Type 2 diabetes mellitus without complications: Secondary | ICD-10-CM | POA: Diagnosis not present

## 2021-12-09 DIAGNOSIS — E8809 Other disorders of plasma-protein metabolism, not elsewhere classified: Secondary | ICD-10-CM | POA: Diagnosis present

## 2021-12-09 DIAGNOSIS — G8929 Other chronic pain: Secondary | ICD-10-CM | POA: Diagnosis present

## 2021-12-09 DIAGNOSIS — Z9181 History of falling: Secondary | ICD-10-CM

## 2021-12-09 DIAGNOSIS — R1310 Dysphagia, unspecified: Secondary | ICD-10-CM | POA: Diagnosis not present

## 2021-12-09 DIAGNOSIS — C9000 Multiple myeloma not having achieved remission: Secondary | ICD-10-CM | POA: Diagnosis present

## 2021-12-09 DIAGNOSIS — E876 Hypokalemia: Secondary | ICD-10-CM | POA: Diagnosis not present

## 2021-12-09 DIAGNOSIS — Z888 Allergy status to other drugs, medicaments and biological substances status: Secondary | ICD-10-CM

## 2021-12-09 DIAGNOSIS — Z823 Family history of stroke: Secondary | ICD-10-CM

## 2021-12-09 DIAGNOSIS — Z8042 Family history of malignant neoplasm of prostate: Secondary | ICD-10-CM

## 2021-12-09 DIAGNOSIS — L0390 Cellulitis, unspecified: Secondary | ICD-10-CM | POA: Diagnosis present

## 2021-12-09 DIAGNOSIS — T17908A Unspecified foreign body in respiratory tract, part unspecified causing other injury, initial encounter: Secondary | ICD-10-CM

## 2021-12-09 DIAGNOSIS — N049 Nephrotic syndrome with unspecified morphologic changes: Secondary | ICD-10-CM | POA: Diagnosis present

## 2021-12-09 DIAGNOSIS — R296 Repeated falls: Secondary | ICD-10-CM | POA: Diagnosis present

## 2021-12-09 DIAGNOSIS — N189 Chronic kidney disease, unspecified: Secondary | ICD-10-CM | POA: Diagnosis not present

## 2021-12-09 DIAGNOSIS — Z7189 Other specified counseling: Secondary | ICD-10-CM | POA: Diagnosis not present

## 2021-12-09 DIAGNOSIS — I4510 Unspecified right bundle-branch block: Secondary | ICD-10-CM | POA: Diagnosis present

## 2021-12-09 DIAGNOSIS — A4101 Sepsis due to Methicillin susceptible Staphylococcus aureus: Secondary | ICD-10-CM | POA: Diagnosis present

## 2021-12-09 DIAGNOSIS — M5416 Radiculopathy, lumbar region: Secondary | ICD-10-CM | POA: Diagnosis present

## 2021-12-09 DIAGNOSIS — F419 Anxiety disorder, unspecified: Secondary | ICD-10-CM | POA: Diagnosis present

## 2021-12-09 DIAGNOSIS — R6520 Severe sepsis without septic shock: Secondary | ICD-10-CM | POA: Diagnosis present

## 2021-12-09 DIAGNOSIS — M8458XA Pathological fracture in neoplastic disease, other specified site, initial encounter for fracture: Secondary | ICD-10-CM | POA: Diagnosis present

## 2021-12-09 DIAGNOSIS — B9561 Methicillin susceptible Staphylococcus aureus infection as the cause of diseases classified elsewhere: Secondary | ICD-10-CM | POA: Diagnosis not present

## 2021-12-09 DIAGNOSIS — M1990 Unspecified osteoarthritis, unspecified site: Secondary | ICD-10-CM | POA: Diagnosis present

## 2021-12-09 DIAGNOSIS — R7881 Bacteremia: Secondary | ICD-10-CM | POA: Diagnosis not present

## 2021-12-09 DIAGNOSIS — I340 Nonrheumatic mitral (valve) insufficiency: Secondary | ICD-10-CM | POA: Diagnosis present

## 2021-12-09 LAB — RESP PANEL BY RT-PCR (FLU A&B, COVID) ARPGX2
Influenza A by PCR: NEGATIVE
Influenza B by PCR: NEGATIVE
SARS Coronavirus 2 by RT PCR: NEGATIVE

## 2021-12-09 LAB — COMPREHENSIVE METABOLIC PANEL
ALT: 16 U/L (ref 0–44)
AST: 41 U/L (ref 15–41)
Albumin: 2.1 g/dL — ABNORMAL LOW (ref 3.5–5.0)
Alkaline Phosphatase: 63 U/L (ref 38–126)
Anion gap: 11 (ref 5–15)
BUN: 43 mg/dL — ABNORMAL HIGH (ref 8–23)
CO2: 27 mmol/L (ref 22–32)
Calcium: 7.6 mg/dL — ABNORMAL LOW (ref 8.9–10.3)
Chloride: 97 mmol/L — ABNORMAL LOW (ref 98–111)
Creatinine, Ser: 2.08 mg/dL — ABNORMAL HIGH (ref 0.61–1.24)
GFR, Estimated: 32 mL/min — ABNORMAL LOW (ref >60)
Glucose, Bld: 106 mg/dL — ABNORMAL HIGH (ref 70–99)
Potassium: 2.3 mmol/L — CL (ref 3.5–5.1)
Sodium: 135 mmol/L (ref 135–145)
Total Bilirubin: 1.8 mg/dL — ABNORMAL HIGH (ref 0.3–1.2)
Total Protein: 5.8 g/dL — ABNORMAL LOW (ref 6.5–8.1)

## 2021-12-09 LAB — PROTIME-INR
INR: 1.5 — ABNORMAL HIGH (ref 0.8–1.2)
Prothrombin Time: 17.7 seconds — ABNORMAL HIGH (ref 11.4–15.2)

## 2021-12-09 LAB — URINALYSIS, COMPLETE (UACMP) WITH MICROSCOPIC
Bilirubin Urine: NEGATIVE
Glucose, UA: NEGATIVE mg/dL
Hgb urine dipstick: NEGATIVE
Leukocytes,Ua: NEGATIVE
Nitrite: NEGATIVE
Specific Gravity, Urine: 1.015 (ref 1.005–1.030)
pH: 5 (ref 5.0–8.0)

## 2021-12-09 LAB — MRSA NEXT GEN BY PCR, NASAL: MRSA by PCR Next Gen: DETECTED — AB

## 2021-12-09 LAB — LACTIC ACID, PLASMA
Lactic Acid, Venous: 5.1 mmol/L (ref 0.5–1.9)
Lactic Acid, Venous: 4.3 mmol/L (ref 0.5–1.9)

## 2021-12-09 LAB — MAGNESIUM: Magnesium: 1.4 mg/dL — ABNORMAL LOW (ref 1.7–2.4)

## 2021-12-09 LAB — CBC WITH DIFFERENTIAL/PLATELET
Abs Immature Granulocytes: 0.07 10*3/uL (ref 0.00–0.07)
Basophils Absolute: 0.1 10*3/uL (ref 0.0–0.1)
Basophils Relative: 1 %
Eosinophils Absolute: 0.4 10*3/uL (ref 0.0–0.5)
Eosinophils Relative: 3 %
HCT: 33.1 % — ABNORMAL LOW (ref 39.0–52.0)
Hemoglobin: 11.5 g/dL — ABNORMAL LOW (ref 13.0–17.0)
Immature Granulocytes: 1 %
Lymphocytes Relative: 3 %
Lymphs Abs: 0.3 10*3/uL — ABNORMAL LOW (ref 0.7–4.0)
MCH: 38.2 pg — ABNORMAL HIGH (ref 26.0–34.0)
MCHC: 34.7 g/dL (ref 30.0–36.0)
MCV: 110 fL — ABNORMAL HIGH (ref 80.0–100.0)
Monocytes Absolute: 0.7 10*3/uL (ref 0.1–1.0)
Monocytes Relative: 7 %
Neutro Abs: 9.3 10*3/uL — ABNORMAL HIGH (ref 1.7–7.7)
Neutrophils Relative %: 85 %
Platelets: 149 10*3/uL — ABNORMAL LOW (ref 150–400)
RBC: 3.01 MIL/uL — ABNORMAL LOW (ref 4.22–5.81)
RDW: 15.8 % — ABNORMAL HIGH (ref 11.5–15.5)
WBC: 10.8 10*3/uL — ABNORMAL HIGH (ref 4.0–10.5)
nRBC: 0 % (ref 0.0–0.2)

## 2021-12-09 LAB — TROPONIN I (HIGH SENSITIVITY)
Troponin I (High Sensitivity): 37 ng/L — ABNORMAL HIGH (ref <18)
Troponin I (High Sensitivity): 38 ng/L — ABNORMAL HIGH (ref <18)

## 2021-12-09 LAB — BRAIN NATRIURETIC PEPTIDE: B Natriuretic Peptide: 1101 pg/mL — ABNORMAL HIGH (ref 0.0–100.0)

## 2021-12-09 LAB — APTT: aPTT: 36 seconds (ref 24–36)

## 2021-12-09 LAB — CBG MONITORING, ED: Glucose-Capillary: 155 mg/dL — ABNORMAL HIGH (ref 70–99)

## 2021-12-09 MED ORDER — ONDANSETRON HCL 4 MG/2ML IJ SOLN: 4.0000 mg | Freq: Four times a day (QID) | INTRAMUSCULAR | Status: DC | PRN

## 2021-12-09 MED ORDER — ENOXAPARIN SODIUM 30 MG/0.3ML IJ SOSY
30.0000 mg | PREFILLED_SYRINGE | INTRAMUSCULAR | Status: DC
  Administered 2021-12-09: 30 mg via SUBCUTANEOUS
  Filled 2021-12-09: qty 0.3

## 2021-12-09 MED ORDER — METHOCARBAMOL 500 MG PO TABS
500.0000 mg | ORAL_TABLET | Freq: Once | ORAL | Status: AC
  Administered 2021-12-09: 500 mg via ORAL
  Filled 2021-12-09: qty 1

## 2021-12-09 MED ORDER — VANCOMYCIN HCL IN DEXTROSE 1-5 GM/200ML-% IV SOLN: 1000.0000 mg | Freq: Once | INTRAVENOUS | Status: DC

## 2021-12-09 MED ORDER — METRONIDAZOLE 500 MG/100ML IV SOLN: 500.0000 mg | Freq: Two times a day (BID) | INTRAVENOUS | Status: DC

## 2021-12-09 MED ORDER — ACETAMINOPHEN 650 MG RE SUPP: 650.0000 mg | Freq: Four times a day (QID) | RECTAL | Status: DC | PRN

## 2021-12-09 MED ORDER — METRONIDAZOLE 500 MG/100ML IV SOLN
500.0000 mg | Freq: Once | INTRAVENOUS | Status: AC
  Administered 2021-12-09: 500 mg via INTRAVENOUS
  Filled 2021-12-09: qty 100

## 2021-12-09 MED ORDER — PANTOPRAZOLE SODIUM 40 MG PO TBEC
40.0000 mg | DELAYED_RELEASE_TABLET | Freq: Every day | ORAL | Status: DC
  Administered 2021-12-09 – 2021-12-13 (×5): 40 mg via ORAL
  Filled 2021-12-09 (×5): qty 1

## 2021-12-09 MED ORDER — SODIUM CHLORIDE 0.9 % IV SOLN
2.0000 g | Freq: Once | INTRAVENOUS | Status: AC
  Administered 2021-12-09: 2 g via INTRAVENOUS
  Filled 2021-12-09: qty 2

## 2021-12-09 MED ORDER — VANCOMYCIN HCL 1250 MG/250ML IV SOLN: 1250.0000 mg | INTRAVENOUS | Status: DC

## 2021-12-09 MED ORDER — IPRATROPIUM-ALBUTEROL 0.5-2.5 (3) MG/3ML IN SOLN
3.0000 mL | Freq: Four times a day (QID) | RESPIRATORY_TRACT | Status: DC | PRN
  Administered 2021-12-10: 3 mL via RESPIRATORY_TRACT
  Filled 2021-12-09: qty 3

## 2021-12-09 MED ORDER — PRAVASTATIN SODIUM 20 MG PO TABS
40.0000 mg | ORAL_TABLET | Freq: Every day | ORAL | Status: DC
  Administered 2021-12-09 – 2021-12-17 (×9): 40 mg via ORAL
  Filled 2021-12-09 (×3): qty 1
  Filled 2021-12-09 (×2): qty 2
  Filled 2021-12-09 (×4): qty 1

## 2021-12-09 MED ORDER — ACETAMINOPHEN 325 MG PO TABS
650.0000 mg | ORAL_TABLET | Freq: Four times a day (QID) | ORAL | Status: DC | PRN
  Administered 2021-12-09 – 2021-12-18 (×14): 650 mg via ORAL
  Filled 2021-12-09 (×15): qty 2

## 2021-12-09 MED ORDER — MAGNESIUM SULFATE 4 GM/100ML IV SOLN
4.0000 g | Freq: Once | INTRAVENOUS | Status: AC
  Administered 2021-12-09: 4 g via INTRAVENOUS
  Filled 2021-12-09: qty 100

## 2021-12-09 MED ORDER — INSULIN ASPART 100 UNIT/ML IJ SOLN
0.0000 [IU] | Freq: Three times a day (TID) | INTRAMUSCULAR | Status: DC
  Administered 2021-12-09 – 2021-12-10 (×2): 3 [IU] via SUBCUTANEOUS
  Administered 2021-12-11 – 2021-12-13 (×4): 2 [IU] via SUBCUTANEOUS
  Administered 2021-12-14: 3 [IU] via SUBCUTANEOUS
  Administered 2021-12-15 (×2): 2 [IU] via SUBCUTANEOUS
  Administered 2021-12-16: 3 [IU] via SUBCUTANEOUS
  Administered 2021-12-17: 2 [IU] via SUBCUTANEOUS
  Filled 2021-12-09 (×12): qty 1

## 2021-12-09 MED ORDER — CALCIUM CARBONATE-VITAMIN D 600-400 MG-UNIT PO CHEW: 1.0000 | CHEWABLE_TABLET | Freq: Two times a day (BID) | ORAL | Status: DC

## 2021-12-09 MED ORDER — ACETAMINOPHEN 325 MG PO TABS
650.0000 mg | ORAL_TABLET | Freq: Once | ORAL | Status: AC
  Administered 2021-12-09: 650 mg via ORAL
  Filled 2021-12-09: qty 2

## 2021-12-09 MED ORDER — SODIUM CHLORIDE 0.9 % IV BOLUS
1000.0000 mL | Freq: Once | INTRAVENOUS | Status: AC
  Administered 2021-12-09: 1000 mL via INTRAVENOUS

## 2021-12-09 MED ORDER — HYDROCODONE-ACETAMINOPHEN 5-325 MG PO TABS
1.0000 | ORAL_TABLET | Freq: Once | ORAL | Status: AC
  Administered 2021-12-09: 1 via ORAL
  Filled 2021-12-09: qty 1

## 2021-12-09 MED ORDER — ASCORBIC ACID 500 MG PO TABS
500.0000 mg | ORAL_TABLET | Freq: Every day | ORAL | Status: DC
  Administered 2021-12-09 – 2021-12-13 (×5): 500 mg via ORAL
  Filled 2021-12-09 (×5): qty 1

## 2021-12-09 MED ORDER — POTASSIUM CHLORIDE 10 MEQ/100ML IV SOLN
10.0000 meq | Freq: Once | INTRAVENOUS | Status: AC
  Administered 2021-12-09: 10 meq via INTRAVENOUS
  Filled 2021-12-09: qty 100

## 2021-12-09 MED ORDER — SODIUM CHLORIDE 0.9 % IV SOLN: 2.0000 g | Freq: Once | INTRAVENOUS | Status: DC

## 2021-12-09 MED ORDER — OYSTER SHELL CALCIUM/D3 500-5 MG-MCG PO TABS
1.0000 | ORAL_TABLET | Freq: Two times a day (BID) | ORAL | Status: DC
  Administered 2021-12-09 – 2021-12-13 (×8): 1 via ORAL
  Filled 2021-12-09 (×8): qty 1

## 2021-12-09 MED ORDER — VITAMIN D (ERGOCALCIFEROL) 1.25 MG (50000 UNIT) PO CAPS: 50000.0000 [IU] | ORAL_CAPSULE | ORAL | Status: DC

## 2021-12-09 MED ORDER — POTASSIUM CHLORIDE CRYS ER 20 MEQ PO TBCR
40.0000 meq | EXTENDED_RELEASE_TABLET | Freq: Once | ORAL | Status: AC
  Administered 2021-12-09: 40 meq via ORAL
  Filled 2021-12-09: qty 2

## 2021-12-09 MED ORDER — ASPIRIN 81 MG PO CHEW
81.0000 mg | CHEWABLE_TABLET | Freq: Every day | ORAL | Status: DC
Start: 2021-12-09 — End: 2021-12-18
  Administered 2021-12-09 – 2021-12-18 (×10): 81 mg via ORAL
  Filled 2021-12-09 (×10): qty 1

## 2021-12-09 MED ORDER — LIDOCAINE 5 % EX PTCH
1.0000 | MEDICATED_PATCH | CUTANEOUS | Status: DC
  Administered 2021-12-13 – 2021-12-17 (×3): 1 via TRANSDERMAL
  Filled 2021-12-09 (×10): qty 1

## 2021-12-09 MED ORDER — ONDANSETRON HCL 4 MG PO TABS
4.0000 mg | ORAL_TABLET | Freq: Four times a day (QID) | ORAL | Status: DC | PRN
  Administered 2021-12-10 – 2021-12-11 (×4): 4 mg via ORAL
  Filled 2021-12-09 (×4): qty 1

## 2021-12-09 MED ORDER — DILTIAZEM HCL-DEXTROSE 125-5 MG/125ML-% IV SOLN (PREMIX)
5.0000 mg/h | INTRAVENOUS | Status: DC
  Administered 2021-12-09: 2.5 mg/h via INTRAVENOUS
  Filled 2021-12-09: qty 125

## 2021-12-09 MED ORDER — SODIUM CHLORIDE 0.9 % IV SOLN
2.0000 g | Freq: Two times a day (BID) | INTRAVENOUS | Status: DC
  Administered 2021-12-10: 2 g via INTRAVENOUS
  Filled 2021-12-09 (×3): qty 2

## 2021-12-09 MED ORDER — VANCOMYCIN HCL 2000 MG/400ML IV SOLN
2000.0000 mg | Freq: Once | INTRAVENOUS | Status: AC
  Administered 2021-12-09: 2000 mg via INTRAVENOUS
  Filled 2021-12-09: qty 400

## 2021-12-09 MED ORDER — ALPRAZOLAM 0.25 MG PO TABS
0.2500 mg | ORAL_TABLET | Freq: Every evening | ORAL | Status: DC | PRN
  Administered 2021-12-10 (×2): 0.25 mg via ORAL
  Filled 2021-12-09 (×2): qty 1

## 2021-12-09 MED ORDER — PAROXETINE HCL 10 MG PO TABS
10.0000 mg | ORAL_TABLET | Freq: Every day | ORAL | Status: DC
  Administered 2021-12-09 – 2021-12-17 (×9): 10 mg via ORAL
  Filled 2021-12-09 (×11): qty 1

## 2021-12-09 NOTE — Sepsis Progress Note (Signed)
ELink tracking the Code Sepsis. 

## 2021-12-09 NOTE — H&P (Addendum)
History and Physical    Luis [name]. ERD:[id number] DOB: 09-13-1946 DOA: 12/09/2021  PCP: Luis Harrier, MD   Patient coming from: [name]  I have personally briefly reviewed patient's old medical records in [place]  Chief Complaint: Shortness of breath, weakness  HPI: [name]. is a 76 y.o. male with medical history significant for coronary artery disease status post CABG, history of multiple myeloma, diabetes mellitus with stage III chronic kidney disease, hypertension, anxiety and depression who presents to the ER for evaluation of worsening shortness of breath from his baseline and weakness. Shortness of breath is mostly with exertion and is occasionally associated with a cough productive of clear to green phlegm.  He denies having any fever but has had chills. Per EMS patient's room air pulse oximetry was 90 to 91% and his blood pressure was in the 80s. He has bilateral lower extremity swelling and has noted increased redness involving his lower extremities. He denies having any headache, no dizziness, no lightheadedness, no abdominal pain, no nausea, no changes in his bowel habits, no urinary symptoms, no chest pain, no blood pressure no focal deficit. Sodium 135, potassium 2.3, chloride 97, bicarb 27, glucose 106, BUN 43, creatinine 2.08, calcium 7.6, alkaline phosphatase 63, albumin 2.1, AST 41, ALT 16, total protein 5.8, troponin 38, lactic acid 5.1 >> 4.3, PT 17.7, INR 1.5 Respiratory viral panel is negative Urine analysis is stable MRSA PCR was positive Chest x-ray reviewed by me shows increased additional groundglass opacities throughout both lungs suggesting possible mild edema.  Underlying chronic pleural-parenchymal scarring. Twelve-lead EKG shows atrial flutter with 2-1 block, PVCs and a right bundle branch block.    ED Course: Patient is a 76 year old male who was sent to the emergency room from the skilled nursing facility "for evaluation of  weakness and shortness of breath. He was said to be hypoxic with room air pulse oximetry initially in the 70s and he was placed on 2 L of oxygen.  He was also hypotensive with systolic blood pressure in the 80s and received 1 L of IV fluid in the ER. He was tachycardic upon arrival to the ER with heart rate of 130 and twelve-lead EKG showed atrial flutter. Patient noted to have bilateral lower extremity cellulitis Patient was tachycardic, tachypneic and had an elevated lactic acid level of 5.1.  He received 1 L of IV fluid.  He did not get the sepsis fluid bolus due to history of CHF and chest x-ray findings suggestive of mild pulmonary edema. He received IV antibiotics and will be admitted to [name] for further evaluation.    Review of Systems: As per HPI otherwise all other systems reviewed and negative.    Past Medical History:  Diagnosis Date   Angina pectoris ([name])    Anxiety    Arthritis    CHF (congestive heart failure) ([name])    Coronary artery disease    Depression    Diabetes mellitus without complication ([name])    Patient takes Metformin.   Diverticulosis    GERD (gastroesophageal reflux disease)    Hyperlipidemia    Hypertension    Multiple myeloma ([place])    Multiple myeloma (Luis [name]) 03/27/2015   Myocardial infarction [name]) April 2001   widowmaker   Shortness of breath dyspnea    Sleep apnea    No CPAP @ present   Spinal stenosis    Squamous cell carcinoma of skin 02/19/2014   Left dorsal hand. WD SCC with  superficial infiltration. Re-shaved 04/23/2014. Re-shaved 09/05/2014.   Squamous cell carcinoma of skin 09/08/2020   Left ant scalp. WD SCC.   Squamous cell carcinoma of skin 02/04/2021   R hand dorsum, [facility]     Past Surgical History:  Procedure Laterality Date   CARDIAC CATHETERIZATION     CARPAL TUNNEL RELEASE     CATARACT EXTRACTION     COLONOSCOPY WITH PROPOFOL N/A 11/11/2015   Procedure: COLONOSCOPY WITH PROPOFOL;  Surgeon: Lollie Sails, MD;   Location: [facility] ENDOSCOPY;  Service: Endoscopy;  Laterality: N/A;   CORONARY ARTERY BYPASS GRAFT     EYE SURGERY Bilateral    Cataract Extraction   INGUINAL HERNIA REPAIR     JOINT REPLACEMENT Right 2008   Right Total Hip Replacement   PILONIDAL CYST EXCISION     ROTATOR CUFF REPAIR     TOTAL HIP ARTHROPLASTY Right    VENTRAL HERNIA REPAIR N/A 08/15/2015   Procedure: VENTRAL HERNIA REPAIR WITH MESH ;  Surgeon: Leonie Green, MD;  Location: [facility] ORS;  Service: General;  Laterality: N/A;     reports that he quit smoking about 21 years ago. His smoking use included cigarettes. He has a 60.00 pack-year smoking history. He quit smokeless tobacco use about 21 years ago. He reports that he does not drink alcohol and does not use drugs.  Allergies  Allergen Reactions   Pravachol [Pravastatin]    Pravastatin Sodium Other (See Comments)   Statins Other (See Comments)    Muscle aches   Zocor [Simvastatin] Other (See Comments)    Muscle aches    Family History  Problem Relation Age of Onset   Heart disease Father    Stroke Mother    Prostate cancer Maternal Grandfather 90      Prior to Admission medications   Medication Sig Start Date End Date Taking? Authorizing Provider  ALPRAZolam (XANAX) 0.25 MG tablet Take 0.25 mg by mouth at bedtime as needed. for sleep 02/06/15   [provider]  ascorbic acid (VITAMIN C) 500 MG tablet Take 1 tablet (500 mg total) by mouth daily. Patient not taking: No sig reported 06/18/20   Lorella Nimrod, MD  aspirin 81 MG tablet Take 81 mg by mouth daily.    [provider]  Bee Pollen 500 MG CHEW Chew 1 tablet by mouth 2 (two) times daily.    [provider]  Calcium Carbonate-Vitamin D 600-400 MG-UNIT chew tablet Chew 1 tablet by mouth 2 (two) times daily.     [provider]  Cyanocobalamin (VITAMIN B-12 IJ) Inject 1 Dose as directed every 30 (thirty) days.     [provider]  ergocalciferol (VITAMIN D2)  50000 UNITS capsule Take 50,000 Units by mouth once a week. Monday    [provider]  furosemide (LASIX) 40 MG tablet Take by mouth. 07/30/21 07/30/22  [provider]  glucose blood test strip USE ONCE DAILY. USE AS INSTRUCTED. DX E11.9 10/04/18   [provider]  ipratropium-albuterol (DUONEB) 0.5-2.5 (3) MG/3ML SOLN Take 3 mLs by nebulization every 6 (six) hours as needed. Patient not taking: No sig reported 11/13/20   Enzo Bi, MD  lidocaine (LIDODERM) 5 % Place 1 patch onto the skin every 12 (twelve) hours. Remove & Discard patch within 12 hours or as directed by MD Patient not taking: No sig reported 01/13/21 01/13/22  Sable Feil, PA-C  losartan (COZAAR) 25 MG tablet Take 25 mg by mouth daily.  Patient not taking: Reported  on 08/19/2021 09/10/19 05/07/21  [provider]  lovastatin (MEVACOR) 40 MG tablet Take 40 mg by mouth at bedtime.     [provider]  omeprazole (PRILOSEC) 20 MG capsule Take 20 mg by mouth daily.    [provider]  PARoxetine (PAXIL) 10 MG tablet Take 10 mg by mouth daily.    [provider]  propranolol (INDERAL) 10 MG tablet Hold due to low heart rate. 11/13/20   Enzo Bi, MD  VENTOLIN HFA 108 [phone] Base) MCG/ACT inhaler Inhale 2 puffs into the lungs every 6 (six) hours as needed for wheezing. 10/31/21   [provider]    Physical Exam: Vitals:   12/09/21 1410 12/09/21 1420 12/09/21 1500 12/09/21 1530  BP: (!) 93/54 (!) 85/75 96/67 (!) 99/59  Pulse: 92 93 90 92  Resp: (!) 24 (!) 27 18 (!) 42  Temp:      TempSrc:      SpO2: 97% 95%    Weight:         Vitals:   12/09/21 1410 12/09/21 1420 12/09/21 1500 12/09/21 1530  BP: (!) 93/54 (!) 85/75 96/67 (!) 99/59  Pulse: 92 93 90 92  Resp: (!) 24 (!) 27 18 (!) 42  Temp:      TempSrc:      SpO2: 97% 95%    Weight:          Constitutional: Alert and oriented x 3 . Not in any apparent distress.  Chronically ill-appearing HEENT:       Head: Normocephalic and atraumatic.         Eyes: PERLA, EOMI, Conjunctivae are normal. Sclera is non-icteric.       Mouth/Throat: Mucous membranes are moist.       Neck: Supple with no signs of meningismus. Cardiovascular: Regular rate and rhythm. No murmurs, gallops, or rubs. 2+ symmetrical distal pulses are present . No JVD. 1+ LE edema Respiratory: Tachypnea.fine crackles at the lung bases bilaterally. No wheezes Gastrointestinal: Soft, non tender, and non distended with positive bowel sounds.  Genitourinary: No CVA tenderness. Musculoskeletal: Redness involving both lower extremities with differential warmth and multiple lower extremity skin tears  Neurologic:  Face is symmetric. Moving all extremities. No gross focal neurologic deficits.  Generalized weakness Skin: Skin is warm, dry.  Multiple skin tears on both lower extremities Psychiatric: Mood and affect are normal    Labs on Admission: I have personally reviewed following labs and imaging studies  CBC: Recent Labs  Lab 12/09/21 1056  WBC 10.8*  NEUTROABS 9.3*  HGB 11.5*  HCT 33.1*  MCV 110.0*  PLT 109*   Basic Metabolic Panel: Recent Labs  Lab 12/09/21 1305  NA 135  K 2.3*  CL 97*  CO2 27  GLUCOSE 106*  BUN 43*  CREATININE 2.08*  CALCIUM 7.6*  MG 1.4*   GFR: Estimated Creatinine Clearance: 30.8 mL/min (A) (by C-G formula based on SCr of 2.08 mg/dL (H)). Liver Function Tests: Recent Labs  Lab 12/09/21 1305  AST 41  ALT 16  ALKPHOS 63  BILITOT 1.8*  PROT 5.8*  ALBUMIN 2.1*   No results for input(s): LIPASE, AMYLASE in the last 168 hours. No results for input(s): AMMONIA in the last 168 hours. Coagulation Profile: Recent Labs  Lab 12/09/21 1208  INR 1.5*   Cardiac Enzymes: No results for input(s): CKTOTAL, CKMB, CKMBINDEX, TROPONINI in the last 168 hours. BNP (last 3 results) No results for input(s): PROBNP in the last 8760 hours. HbA1C: No  results for input(s): HGBA1C in the last 72  hours. CBG: No results for input(s): GLUCAP in the last 168 hours. Lipid Profile: No results for input(s): CHOL, HDL, LDLCALC, TRIG, CHOLHDL, LDLDIRECT in the last 72 hours. Thyroid Function Tests: No results for input(s): TSH, T4TOTAL, FREET4, T3FREE, THYROIDAB in the last 72 hours. Anemia Panel: No results for input(s): VITAMINB12, FOLATE, FERRITIN, TIBC, IRON, RETICCTPCT in the last 72 hours. Urine analysis:    Component Value Date/Time   COLORURINE YELLOW 12/09/2021 1119   APPEARANCEUR CLEAR (A) 12/09/2021 1119   APPEARANCEUR Clear 08/07/2013 0102   LABSPEC 1.015 12/09/2021 1119   LABSPEC 1.023 08/07/2013 0102   PHURINE 5.0 12/09/2021 1119   GLUCOSEU NEGATIVE 12/09/2021 1119   GLUCOSEU 50 mg/dL 08/07/2013 0102   HGBUR NEGATIVE 12/09/2021 1119   BILIRUBINUR NEGATIVE 12/09/2021 1119   BILIRUBINUR Negative 08/07/2013 0102   KETONESUR TRACE (A) 12/09/2021 1119   PROTEINUR TRACE (A) 12/09/2021 1119   NITRITE NEGATIVE 12/09/2021 1119   LEUKOCYTESUR NEGATIVE 12/09/2021 1119   LEUKOCYTESUR Negative 08/07/2013 0102    Radiological Exams on Admission: DG Chest Port 1 View  Result Date: 12/09/2021 CLINICAL DATA:  Shortness of breath. History of congestive heart failure, hypertension and diabetes. Questionable sepsis-evaluate for abnormality. EXAM: PORTABLE CHEST 1 VIEW COMPARISON:  Radiographs 12/01/2021 and 09/07/2021.  CT 11/07/2020. FINDINGS: 1058 hours. The heart size and mediastinal contours are stable status post median sternotomy and CABG. There is aortic atherosclerosis. There is chronic lung disease with asymmetric pleural thickening and parenchymal scarring on the left. Compared with the most recent radiographs, there are increased interstitial and ground-glass opacities throughout both lungs which could reflect superimposed mild edema. No confluent airspace opacity, pneumothorax or significant pleural effusion identified. Old rib fractures are present on the left. There is  advanced arthropathy at both shoulders. IMPRESSION: Increased interstitial and ground-glass opacities throughout both lungs compared with most recent radiographs, suggesting possible mild edema. Underlying chronic pleuroparenchymal scarring as described. Electronically Signed   By: Richardean Sale M.D.   On: 12/09/2021 11:11     Assessment/Plan Principal Problem:   Sepsis ([place]) Active Problems:   Spinal stenosis   Type 2 diabetes mellitus without complication, without long-term current use of insulin (HCC)   CAD (coronary artery disease) of artery bypass graft   Depression with anxiety   Atrial fibrillation with RVR (HCC)   Cellulitis   Hypokalemia     Patient is a 76 year old male admitted to [name] for sepsis from lower extremity cellulitis   Sepsis from bilateral lower extremity cellulitis (POA) As evidenced by hypotension responded to IV fluid, tachycardia, tachypnea, lactic acidosis and bilateral lower extremity cellulitis Patient received 1 L of IV fluid in the ER and has not received any more fluids due to chest x-ray findings of pulmonary edema. Treat patient empirically with vancomycin and cefepime His MRSA PCR is positive Trend lactic acid levels Follow-up results of blood cultures      Acute respiratory failure Most likely secondary to acute on chronic diastolic dysfunction CHF Patient had room air pulse oximetry in the 70s and was tachypneic with increased work of breathing. Chest x-ray shows findings suggestive of mild pulmonary edema. 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 Patient not on long-term anticoagulation therapy due to history of frequent falls Continue aspirin   DVT prophylaxis: Lovenox  Code Status: full code  Family Communication: Greater than 50% of time was spent discussing patient's condition and plan of care with him, his brother and his sons at the bedside.  All questions and concerns have been addressed.  They verbalized understanding and agreed with the plan.  CODE STATUS was discussed and he wishes to be full code. Disposition Plan: Back to previous home environment Consults called: none  Status:At the time of admission, it appears that the appropriate admission status for this patient is inpatient. This is judged to be reasonable and necessary to provide the required intensity of service to ensure the patient's safety given the presenting symptoms, physical exam findings, and initial radiographic and laboratory data in the context of their comorbid conditions. Patient requires inpatient status due to high intensity of service, high risk for further deterioration and high frequency of surveillance required.     Collier Bullock MD [facility]     12/09/2021, 4:18 PM

## 2021-12-09 NOTE — Sepsis Progress Note (Signed)
Notified bedside nurse of need to draw repeat lactic acid. 

## 2021-12-09 NOTE — ED Triage Notes (Signed)
BIB [place] from [facility], called out for SOB and general weakness. Seen here recently here for fall with head lac and repair, has had BLE cellulitis going on for months. Pt triggered sepsis protocol for EMS. BP 87/37. Also concern for UTI. CBG 119, ETCO2 22, SPO2 on RA 91%, up to 96% on 2L. No NSL started. Son following. A&O, NAD, interactive on arrival. PT present on EMS arrival at scene and reports has had increased difficulty walking, but legs have been this way for months.

## 2021-12-09 NOTE — Consult Note (Signed)
PHARMACY -  BRIEF ANTIBIOTIC NOTE   Pharmacy has received consult(s) for cefepime and vancomycin from an ED provider.  The patient's profile has been reviewed for ht/wt/allergies/indication/available labs.    One time order(s) placed for  Cefepime 2 gram Vancomycin 2 gram  Further antibiotics/pharmacy consults should be ordered by admitting physician if indicated.                       Thank you, Dorothe Pea, PharmD, BCPS Clinical Pharmacist   12/09/2021  12:19 PM

## 2021-12-09 NOTE — Progress Notes (Addendum)
Cross Cover Lidocaine patch and robaxin ordered for complaints of chronic back and leg pain unrelieved with tylenol  - nurse reported robaxin ineffective, patient refused lidocaine patch. Dose of hydrocodone ordered

## 2021-12-09 NOTE — Progress Notes (Incomplete Revision)
Cross Cover Lidocaine patch and robaxin ordered for complaints of chronic back and leg pain unrelieved with tylenol  - nurse reported robaxin ineffective, patient refused lidocaine patch. Dose of hydrocodone ordered

## 2021-12-09 NOTE — Progress Notes (Signed)
Pharmacy Antibiotic Note  Luis [name]. is a 76 y.o. male PMH CHF, diabetes, spinal stenosis, and recent BLE cellulitis for which patient was recently hospitalized. Presenting to ED with shortness of breath and general weakness. Admitted on 12/09/2021 with sepsis and cellulitis.  Pharmacy has been consulted for vancomycin and cefepime dosing.  Hypotensive, hypoxic, tachypnea and tachycardic on presentation. LA elevated. Scr 2.08 significantly above BL of ~1.07-1.18. Vancomycin 2 gram loading dose given 2/1 @1258  and cefepime 2 g x 1 given 2/1 @1228 .    Plan: Vancomycin 1250 mg every 36 hours Given sepsis status, expect renal function to decline tomorrow. Follow closely for need to adjust dosing after AM labs.  Cefepime 2 grams every 12 hours Follow up Scr with AM labs to determine need for further dose adjustment.  Monitor renal function, clinical course, and length of therapy.  Weight: 83.5 kg (184 lb)  Temp (24hrs), Avg:98.5 F (36.9 C), Min:98.5 F (36.9 C), Max:98.5 F (36.9 C)  Recent Labs  Lab 12/09/21 1056 12/09/21 1059 12/09/21 1250 12/09/21 1305  WBC 10.8*  --   --   --   CREATININE  --   --   --  2.08*  LATICACIDVEN  --  5.1* 4.3*  --     Estimated Creatinine Clearance: 30.8 mL/min (A) (by C-G formula based on SCr of 2.08 mg/dL (H)).    Allergies  Allergen Reactions   Pravachol [Pravastatin]    Pravastatin Sodium Other (See Comments)   Statins Other (See Comments)    Muscle aches   Zocor [Simvastatin] Other (See Comments)    Muscle aches    Antimicrobials this admission: Vancomycin 12/09/21  >>  Cefepime 12/09/21 >>   Dose adjustments this admission: None  Microbiology results: 12/09/21 BCx: in process 12/09/21 UCx: sent  12/09/21 MRSA PCR: Detected  Thank you for allowing pharmacy to be a part of this patients care.  Wynelle Cleveland, PharmD Pharmacy Resident  12/09/2021 5:16 PM

## 2021-12-09 NOTE — Consult Note (Signed)
CODE SEPSIS - PHARMACY COMMUNICATION  **Broad Spectrum Antibiotics should be administered within 1 hour of Sepsis diagnosis**  Time Code Sepsis Called/Page Received: 1218  Antibiotics Ordered: 1217  Time of 1st antibiotic administration: [place] ,PharmD, BCPS Clinical Pharmacist  12/09/2021  12:19 PM

## 2021-12-09 NOTE — ED Notes (Signed)
Back from CT, alert, NAD, calm, interactive.  

## 2021-12-09 NOTE — ED Notes (Signed)
Pt alert, NAD, calm, interactive, resps e/u, speaking with EDP.

## 2021-12-09 NOTE — ED Notes (Signed)
Re-draw blue sent to lab

## 2021-12-09 NOTE — ED Provider Notes (Signed)
[facility] Provider Note    Event Date/Time   First MD Initiated Contact with Patient 12/09/21 1043     (approximate)  History   Chief Complaint: Shortness of Breath  HPI  Luis [name]. is a 76 y.o. male with a past medical history of CHF, diabetes, spinal stenosis, presents to the emergency department for generalized weakness, hypotension and concern for lower extremity cellulitis.  According to EMS report patient is coming from [facility] facility where he has been complaining of increased weakness, increased shortness of breath over the past 2 days.  Physical therapist that this morning that the legs appeared more erythematous and she was concerned for cellulitis.  Patient's pulse ox was 90 to 91% on room air, no baseline O2 requirement currently 100% on 2 L nasal cannula.  On arrival patient noted to be tachycardic around 130 blood pressure had improved to 97/60, EMS states systolic in the 22Q.  Physical Exam   Triage Vital Signs: ED Triage Vitals  Enc Vitals Group     BP --      Pulse --      Resp --      Temp --      Temp src --      SpO2 12/09/21 1044 91 %     Weight 12/09/21 1050 184 lb (83.5 kg)     Height --      Head Circumference --      Peak Flow --      Pain Score 12/09/21 1050 0     Pain Loc --      Pain Edu? --      Excl. in [place]? --     Most recent vital signs: Vitals:   12/09/21 1044  SpO2: 91%    General: Awake, no distress.  Alert and oriented. CV:  Regular rhythm rate around 130 bpm. Resp:  Normal effort but slight tachypnea.  Equal breath sounds bilaterally.  No obvious wheeze rales or rhonchi. Abd:  No distention.  Soft, nontender.  No rebound or guarding. Other:  Patient does have wounds over most of his body, he states this is chronic times many months they itch and will bleed at times.  States he has been to many physicians who cannot tell him why he has these wounds besides that is possibly related to CHF  per patient.  Patient does have mild erythema of the bilateral lower extremities with 1+ edema to bilateral lower extremities as well.  ED Results / Procedures / Treatments   EKG  EKG viewed and interpreted by myself shows what appears to be atrial flutter versus tachycardia at 118 bpm with a borderline widened QRS, normal axis, normal intervals with nonspecific ST changes.  In reviewing the cardiac monitor patient did have a 5 beat run of ventricular tachycardia.  RADIOLOGY  I have personally reviewed the chest x-ray images appears to have bilateral opacities concerning for edema versus infection Radiology is read the CT as increased interstitial groundglass opacities throughout both lungs suggesting possible edema.  MEDICATIONS ORDERED IN ED: Medications  sodium chloride 0.9 % bolus 1,000 mL (has no administration in time range)     IMPRESSION / MDM / ASSESSMENT AND PLAN / ED COURSE  I reviewed the triage vital signs and the nursing notes.  Patient presents emergency department from his nursing facility for increased weakness and shortness of breath found to be hypotensive this morning.  Patient denies any chest pain.  Does state back pain but states this is chronic due to his spinal stenosis.  Patient does have 1+ edema to bilateral lower extremities with erythema to bilateral lower extremities.  Patient has diffuse skin lesions/wounds across his entire body many which appears scabbed over.  Patient admits to itching these causing them to bleed at times.  We will check labs, chest x-ray.  Patient's blood pressure is somewhat hypotensive 97/60 we will dose IV fluids.  Differential is quite broad would include dehydration/hypovolemia, infectious etiology, metabolic or electrolyte abnormality, CHF exacerbation, cellulitis.  Patient's x-ray has resulted showing concern for CHF exacerbation/pulmonary edema, patient's BNP is elevated at 1100 consistent with CHF exacerbation/pulmonary edema.   Patient's labs show slight leukocytosis 10,800, otherwise reassuring with a lactic acid of 5.1.  Given the bilateral lower extremity erythema initial hypotension, hypoxia at times on room air we will place patient on 2 L nasal cannula, we will start the patient on broad-spectrum antibiotics.  We will hold off on further fluids even though his lactate is elevated given signs of significant CHF exacerbation and hypoxia.  Patient will require admission to the hospital service for further work-up and treatment once remainder of his labs have resulted.  Patient's chemistry resulted showing significant hypokalemia of 2.3.  We will replete with oral and IV potassium.  Patient's creatinine slightly worse than baseline although not significantly changed from historical values.  Troponin found to be slightly elevated 38.  Given the patient's generalized weakness fatigue elevated lactic and concern for possible infection/sepsis patient will be admitted to the hospital service for ongoing work-up and treatment.  Patient agreeable plan of care.  Patient's heart rate has slowed on its own without diltiazem currently around 90 bpm.  CRITICAL CARE Performed by: Harvest Dark   Total critical care time: 45 minutes  Critical care time was exclusive of separately billable procedures and treating other patients.  Critical care was necessary to treat or prevent imminent or life-threatening deterioration.  Critical care was time spent personally by me on the following activities: development of treatment plan with patient and/or surrogate as well as nursing, discussions with consultants, evaluation of patient's response to treatment, examination of patient, obtaining history from patient or surrogate, ordering and performing treatments and interventions, ordering and review of laboratory studies, ordering and review of radiographic studies, pulse oximetry and re-evaluation of patient's condition.   FINAL CLINICAL  IMPRESSION(S) / ED DIAGNOSES   Sepsis  weakness Dyspnea CHF exacerbation  Note:  This document was prepared using Dragon voice recognition software and may include unintentional dictation errors.   Harvest Dark, MD 12/09/21 1451

## 2021-12-10 DIAGNOSIS — R7881 Bacteremia: Secondary | ICD-10-CM | POA: Diagnosis not present

## 2021-12-10 DIAGNOSIS — A419 Sepsis, unspecified organism: Secondary | ICD-10-CM | POA: Diagnosis not present

## 2021-12-10 DIAGNOSIS — E440 Moderate protein-calorie malnutrition: Secondary | ICD-10-CM | POA: Insufficient documentation

## 2021-12-10 DIAGNOSIS — B9561 Methicillin susceptible Staphylococcus aureus infection as the cause of diseases classified elsewhere: Secondary | ICD-10-CM | POA: Diagnosis not present

## 2021-12-10 LAB — GLUCOSE, CAPILLARY
Glucose-Capillary: 100 mg/dL — ABNORMAL HIGH (ref 70–99)
Glucose-Capillary: 144 mg/dL — ABNORMAL HIGH (ref 70–99)
Glucose-Capillary: 156 mg/dL — ABNORMAL HIGH (ref 70–99)
Glucose-Capillary: 96 mg/dL (ref 70–99)
Glucose-Capillary: 99 mg/dL (ref 70–99)

## 2021-12-10 LAB — BLOOD CULTURE ID PANEL (REFLEXED) - BCID2

## 2021-12-10 LAB — BASIC METABOLIC PANEL
Anion gap: 9 (ref 5–15)
BUN: 51 mg/dL — ABNORMAL HIGH (ref 8–23)
CO2: 27 mmol/L (ref 22–32)
Calcium: 7.4 mg/dL — ABNORMAL LOW (ref 8.9–10.3)
Chloride: 96 mmol/L — ABNORMAL LOW (ref 98–111)
Creatinine, Ser: 2.16 mg/dL — ABNORMAL HIGH (ref 0.61–1.24)
GFR, Estimated: 31 mL/min — ABNORMAL LOW (ref >60)
Glucose, Bld: 117 mg/dL — ABNORMAL HIGH (ref 70–99)
Potassium: 2.9 mmol/L — ABNORMAL LOW (ref 3.5–5.1)
Sodium: 132 mmol/L — ABNORMAL LOW (ref 135–145)

## 2021-12-10 LAB — CBC
HCT: 27.9 % — ABNORMAL LOW (ref 39.0–52.0)
Hemoglobin: 9.8 g/dL — ABNORMAL LOW (ref 13.0–17.0)
MCH: 38.3 pg — ABNORMAL HIGH (ref 26.0–34.0)
MCHC: 35.1 g/dL (ref 30.0–36.0)
MCV: 109 fL — ABNORMAL HIGH (ref 80.0–100.0)
Platelets: 129 10*3/uL — ABNORMAL LOW (ref 150–400)
RBC: 2.56 MIL/uL — ABNORMAL LOW (ref 4.22–5.81)
RDW: 15.8 % — ABNORMAL HIGH (ref 11.5–15.5)
WBC: 8.1 10*3/uL (ref 4.0–10.5)
nRBC: 0 % (ref 0.0–0.2)

## 2021-12-10 LAB — LACTIC ACID, PLASMA: Lactic Acid, Venous: 2.5 mmol/L (ref 0.5–1.9)

## 2021-12-10 LAB — PROCALCITONIN: Procalcitonin: 1 ng/mL

## 2021-12-10 LAB — MAGNESIUM
Magnesium: 2.5 mg/dL — ABNORMAL HIGH (ref 1.7–2.4)
Magnesium: 2.3 mg/dL (ref 1.7–2.4)

## 2021-12-10 LAB — CORTISOL-AM, BLOOD: Cortisol - AM: 12.5 ug/dL (ref 6.7–22.6)

## 2021-12-10 LAB — PROTIME-INR
INR: 1.5 — ABNORMAL HIGH (ref 0.8–1.2)
Prothrombin Time: 18 seconds — ABNORMAL HIGH (ref 11.4–15.2)

## 2021-12-10 MED ORDER — SODIUM CHLORIDE 0.9 % IV SOLN: INTRAVENOUS | Status: DC | PRN

## 2021-12-10 MED ORDER — POTASSIUM CHLORIDE 20 MEQ PO PACK
40.0000 meq | PACK | Freq: Once | ORAL | Status: AC
  Administered 2021-12-10: 40 meq via ORAL
  Filled 2021-12-10: qty 2

## 2021-12-10 MED ORDER — LORATADINE 10 MG PO TABS
10.0000 mg | ORAL_TABLET | Freq: Every day | ORAL | Status: DC
  Administered 2021-12-10 – 2021-12-18 (×9): 10 mg via ORAL
  Filled 2021-12-10 (×9): qty 1

## 2021-12-10 MED ORDER — CEFAZOLIN SODIUM-DEXTROSE 2-4 GM/100ML-% IV SOLN
2.0000 g | Freq: Two times a day (BID) | INTRAVENOUS | Status: DC
  Administered 2021-12-10 (×2): 2 g via INTRAVENOUS
  Filled 2021-12-10 (×4): qty 100

## 2021-12-10 MED ORDER — POTASSIUM CHLORIDE CRYS ER 20 MEQ PO TBCR
80.0000 meq | EXTENDED_RELEASE_TABLET | Freq: Once | ORAL | Status: AC
Start: 2021-12-10 — End: 2021-12-10
  Administered 2021-12-10: 80 meq via ORAL
  Filled 2021-12-10: qty 4

## 2021-12-10 MED ORDER — POTASSIUM CHLORIDE IN NACL 20-0.9 MEQ/L-% IV SOLN
INTRAVENOUS | Status: DC
  Filled 2021-12-10 (×5): qty 1000

## 2021-12-10 MED ORDER — LOSARTAN POTASSIUM 25 MG PO TABS: 25.0000 mg | ORAL_TABLET | Freq: Every day | ORAL | Status: DC

## 2021-12-10 MED ORDER — HEPARIN SODIUM (PORCINE) 5000 UNIT/ML IJ SOLN
5000.0000 [IU] | Freq: Three times a day (TID) | INTRAMUSCULAR | Status: DC
  Administered 2021-12-10 – 2021-12-18 (×23): 5000 [IU] via SUBCUTANEOUS
  Filled 2021-12-10 (×23): qty 1

## 2021-12-10 MED ORDER — ADULT MULTIVITAMIN W/MINERALS CH
1.0000 | ORAL_TABLET | Freq: Every day | ORAL | Status: DC
  Administered 2021-12-10 – 2021-12-18 (×9): 1 via ORAL
  Filled 2021-12-10 (×9): qty 1

## 2021-12-10 NOTE — Plan of Care (Signed)

## 2021-12-10 NOTE — Progress Notes (Signed)
PHARMACY - PHYSICIAN COMMUNICATION CRITICAL VALUE ALERT - BLOOD CULTURE IDENTIFICATION (BCID)  Luis [name]. is an 76 y.o. male who presented to [facility] on 12/09/2021 with a chief complaint of sepsis, cellulitis  Assessment:  Staph aureus in 1 of 4 bottles (aerobic) , no resistance detected.   Possibly contaminant (include suspected source if known)  Name of physician (or Provider) Contacted: Rachael Fee   Current antibiotics: Vanc, Cefepime   Changes to prescribed antibiotics recommended:  Will continue current abx in case something else grows out  Results for orders placed or performed during the hospital encounter of 12/09/21  Blood Culture ID Panel (Reflexed) (Collected: 12/09/2021 10:57 AM)  Result Value Ref Range   Enterococcus faecalis NOT DETECTED NOT DETECTED   Enterococcus Faecium NOT DETECTED NOT DETECTED   Listeria monocytogenes NOT DETECTED NOT DETECTED   Staphylococcus species DETECTED (A) NOT DETECTED   Staphylococcus aureus (BCID) DETECTED (A) NOT DETECTED   Staphylococcus epidermidis NOT DETECTED NOT DETECTED   Staphylococcus lugdunensis NOT DETECTED NOT DETECTED   Streptococcus species NOT DETECTED NOT DETECTED   Streptococcus agalactiae NOT DETECTED NOT DETECTED   Streptococcus pneumoniae NOT DETECTED NOT DETECTED   Streptococcus pyogenes NOT DETECTED NOT DETECTED   A.calcoaceticus-baumannii NOT DETECTED NOT DETECTED   Bacteroides fragilis NOT DETECTED NOT DETECTED   Enterobacterales NOT DETECTED NOT DETECTED   Enterobacter cloacae complex NOT DETECTED NOT DETECTED   Escherichia coli NOT DETECTED NOT DETECTED   Klebsiella aerogenes NOT DETECTED NOT DETECTED   Klebsiella oxytoca NOT DETECTED NOT DETECTED   Klebsiella pneumoniae NOT DETECTED NOT DETECTED   Proteus species NOT DETECTED NOT DETECTED   Salmonella species NOT DETECTED NOT DETECTED   Serratia marcescens NOT DETECTED NOT DETECTED   Haemophilus influenzae NOT DETECTED NOT DETECTED   Neisseria  meningitidis NOT DETECTED NOT DETECTED   Pseudomonas aeruginosa NOT DETECTED NOT DETECTED   Stenotrophomonas maltophilia NOT DETECTED NOT DETECTED   Candida albicans NOT DETECTED NOT DETECTED   Candida auris NOT DETECTED NOT DETECTED   Candida glabrata NOT DETECTED NOT DETECTED   Candida krusei NOT DETECTED NOT DETECTED   Candida parapsilosis NOT DETECTED NOT DETECTED   Candida tropicalis NOT DETECTED NOT DETECTED   Cryptococcus neoformans/gattii NOT DETECTED NOT DETECTED   Meth resistant mecA/C and MREJ NOT DETECTED NOT DETECTED    Jovee Dettinger D 12/10/2021  2:19 AM

## 2021-12-10 NOTE — Progress Notes (Signed)
PROGRESS NOTE    Luis [name].  JGO:[id number] DOB: 1946/10/21 DOA: 12/09/2021 PCP: Tracie Harrier, MD  Outpatient Specialists: cardiology, dermatology, oncology    Brief Narrative:   From admission h and p Luis [name]. is a 76 y.o. male with medical history significant for coronary artery disease status post CABG, history of multiple myeloma, diabetes mellitus with stage III chronic kidney disease, hypertension, anxiety and depression who presents to the ER for evaluation of worsening shortness of breath from his baseline and weakness. Shortness of breath is mostly with exertion and is occasionally associated with a cough productive of clear to green phlegm.  He denies having any fever but has had chills. Per EMS patient's room air pulse oximetry was 90 to 91% and his blood pressure was in the 80s. He has bilateral lower extremity swelling and has noted increased redness involving his lower extremities. He denies having any headache, no dizziness, no lightheadedness, no abdominal pain, no nausea, no changes in his bowel habits, no urinary symptoms, no chest pain, no blood pressure no focal deficit. Sodium 135, potassium 2.3, chloride 97, bicarb 27, glucose 106, BUN 43, creatinine 2.08, calcium 7.6, alkaline phosphatase 63, albumin 2.1, AST 41, ALT 16, total protein 5.8, troponin 38, lactic acid 5.1 >> 4.3, PT 17.7, INR 1.5 Respiratory viral panel is negative Urine analysis is stable MRSA PCR was positive Chest x-ray reviewed by me shows increased additional groundglass opacities throughout both lungs suggesting possible mild edema.  Underlying chronic pleural-parenchymal scarring. Twelve-lead EKG shows atrial flutter with 2-1 block, PVCs and a right bundle branch block.   Assessment & Plan:   Principal Problem:   Sepsis ([place]) Active Problems:   Spinal stenosis   Type 2 diabetes mellitus without complication, without long-term current use of insulin (HCC)   CAD (coronary  artery disease) of artery bypass graft   Depression with anxiety   Atrial fibrillation with RVR (HCC)   Cellulitis   Hypokalemia   # lower extremity cellulitis # Sepsis # Bacteremia Sepsis by tachycardia, lactic acidosis. S/p 1 liter in the ED. 1/4 BCs growing s aureus. Pharm advises abx switch to cefazolin - f/u cultures - start cefazolin - cont ivf - trend lactate  # Acute hypoxic respiratory failure Likely 2/2 sepsis on CHF, debility, a fib with rvr - fluid resuscitation for now, will need to be watchful for signs fluid overload - o2  # Acute kidney injury Cr 2.16 from baseline around 1.2. think this likely 2/2 infection so treating for such - fluid resuscitation for now  # Hypokalemia # Hypomagenesemia K 2.9 this morning - mg pending - 80 meq ordered  # dCHF # CAD s/p bypass graft No signs/symptoms ACS. Bnp is markedly elevated. Patient reports lower extremity swelling is mild and improved from baseline so do not think decompensated chf. Tte 11/2020 with ef 45-50 - fluid resuscitation for now, holding diuretics  # A fib with rvr RVR resolved. Not angicoagulated 2/2 frequent falls - cont home coreg  # HTN Here bp low normal - hold home losartan, cont coreg   # Multiple myeloma - followed by onc, not currently treated  # T7 compression fracture 2/2 MM.  - pain control          DVT prophylaxis: heparin Code Status: full Family Communication: son updated @ bedside  Level of care: Progressive Status is: Inpatient Remains inpatient appropriate because: severity of illness            Consultants:  none  Procedures: none  Antimicrobials:  Vanc/cefepime>cefazolin    Subjective: Feels fatigued, no sob at rest, no chest pain  Objective: Vitals:   12/09/21 1940 12/09/21 2336 12/10/21 0349 12/10/21 0735  BP: (!) 104/50 (!) 99/48 (!) 94/53 (!) 109/53  Pulse: 77 88 87 83  Resp: _0 Temp: 98.5 F (36.9 C) 98.7 F (37.1 C) 97.7  F (36.5 C) 98.1 F (36.7 C)  TempSrc:      SpO2: 98% 91% 90% 92%  Weight:        Intake/Output Summary (Last 24 hours) at 12/10/2021 3612 Last data filed at 12/10/2021 2449 Gross per 24 hour  Intake 2017.36 ml  Output --  Net 2017.36 ml   Filed Weights   12/09/21 1050  Weight: 83.5 kg    Examination:  General exam: Appears calm and comfortable  Respiratory system: rales at bases Cardiovascular system: S1 & S2 heard, irreg irreg, soft systolic murmur No JVD, murmurs, rubs, gallops or clicks.  Gastrointestinal system: Abdomen is nondistended, soft and nontender. No organomegaly or masses felt. Normal bowel sounds heard. Central nervous system: Alert and oriented. No focal neurological deficits. Extremities: Symmetric 5 x 5 power. Skin:    Psychiatry: Judgement and insight appear normal. Mood & affect appropriate.     Data Reviewed: I have personally reviewed following labs and imaging studies  CBC: Recent Labs  Lab 12/09/21 1056 12/10/21 0621  WBC 10.8* 8.1  NEUTROABS 9.3*  --   HGB 11.5* 9.8*  HCT 33.1* 27.9*  MCV 110.0* 109.0*  PLT 149* 753*   Basic Metabolic Panel: Recent Labs  Lab 12/09/21 1305 12/10/21 0621  NA 135 132*  K 2.3* 2.9*  CL 97* 96*  CO2 27 27  GLUCOSE 106* 117*  BUN 43* 51*  CREATININE 2.08* 2.16*  CALCIUM 7.6* 7.4*  MG 1.4*  --    GFR: Estimated Creatinine Clearance: 29.6 mL/min (A) (by C-G formula based on SCr of 2.16 mg/dL (H)). Liver Function Tests: Recent Labs  Lab 12/09/21 1305  AST 41  ALT 16  ALKPHOS 63  BILITOT 1.8*  PROT 5.8*  ALBUMIN 2.1*   No results for input(s): LIPASE, AMYLASE in the last 168 hours. No results for input(s): AMMONIA in the last 168 hours. Coagulation Profile: Recent Labs  Lab 12/09/21 1208 12/10/21 0621  INR 1.5* 1.5*   Cardiac Enzymes: No results for input(s): CKTOTAL, CKMB, CKMBINDEX, TROPONINI in the last 168 hours. BNP (last 3 results) No results for input(s): PROBNP in the last  8760 hours. HbA1C: No results for input(s): HGBA1C in the last 72 hours. CBG: Recent Labs  Lab 12/09/21 1845 12/10/21 0020 12/10/21 0736  GLUCAP 155* 96 100*   Lipid Profile: No results for input(s): CHOL, HDL, LDLCALC, TRIG, CHOLHDL, LDLDIRECT in the last 72 hours. Thyroid Function Tests: No results for input(s): TSH, T4TOTAL, FREET4, T3FREE, THYROIDAB in the last 72 hours. Anemia Panel: No results for input(s): VITAMINB12, FOLATE, FERRITIN, TIBC, IRON, RETICCTPCT in the last 72 hours. Urine analysis:    Component Value Date/Time   COLORURINE YELLOW 12/09/2021 1119   APPEARANCEUR CLEAR (A) 12/09/2021 1119   APPEARANCEUR Clear 08/07/2013 0102   LABSPEC 1.015 12/09/2021 1119   LABSPEC 1.023 08/07/2013 0102   PHURINE 5.0 12/09/2021 1119   GLUCOSEU NEGATIVE 12/09/2021 1119   GLUCOSEU 50 mg/dL 08/07/2013 0102   HGBUR NEGATIVE 12/09/2021 1119   BILIRUBINUR NEGATIVE 12/09/2021 1119   BILIRUBINUR Negative 08/07/2013 0102   KETONESUR TRACE (A) 12/09/2021 1119  PROTEINUR TRACE (A) 12/09/2021 1119   NITRITE NEGATIVE 12/09/2021 1119   LEUKOCYTESUR NEGATIVE 12/09/2021 1119   LEUKOCYTESUR Negative 08/07/2013 0102   Sepsis Labs: _0 (procalcitonin:4,lacticidven:4)  ) Recent Results (from the past 240 hour(s))  Blood Culture (routine x 2)     Status: None (Preliminary result)   Collection Time: 12/09/21 10:57 AM   Specimen: BLOOD  Result Value Ref Range Status   Specimen Description BLOOD LEFT WRIST  Final   Special Requests   Final    BOTTLES DRAWN AEROBIC AND ANAEROBIC Blood Culture adequate volume   Culture  Setup Time   Final    Organism ID to follow GRAM POSITIVE COCCI AEROBIC BOTTLE ONLY CRITICAL RESULT CALLED TO, READ BACK BY AND VERIFIED WITH: JASON ROBBINS @ 0149 ON 12/10/21.Marland KitchenMarland KitchenTKR Performed at [facility], [address] [place], [place] [address]    Culture [facility] POSITIVE COCCI  Final   Report Status PENDING  Incomplete  Blood Culture ID Panel  (Reflexed)     Status: Abnormal   Collection Time: 12/09/21 10:57 AM  Result Value Ref Range Status   Enterococcus faecalis NOT DETECTED NOT DETECTED Final   Enterococcus Faecium NOT DETECTED NOT DETECTED Final   Listeria monocytogenes NOT DETECTED NOT DETECTED Final   Staphylococcus species DETECTED (A) NOT DETECTED Final    Comment: CRITICAL RESULT CALLED TO, READ BACK BY AND VERIFIED WITH: JASON ROBBINS @ 0149 ON 12/10/21.Marland KitchenMarland KitchenTKR    Staphylococcus aureus (BCID) DETECTED (A) NOT DETECTED Final    Comment: CRITICAL RESULT CALLED TO, READ BACK BY AND VERIFIED WITH: JASON ROBBINS @ 0149 ON 12/10/21.Marland KitchenMarland KitchenTKR    Staphylococcus epidermidis NOT DETECTED NOT DETECTED Final   Staphylococcus lugdunensis NOT DETECTED NOT DETECTED Final   Streptococcus species NOT DETECTED NOT DETECTED Final   Streptococcus agalactiae NOT DETECTED NOT DETECTED Final   Streptococcus pneumoniae NOT DETECTED NOT DETECTED Final   Streptococcus pyogenes NOT DETECTED NOT DETECTED Final   A.calcoaceticus-baumannii NOT DETECTED NOT DETECTED Final   Bacteroides fragilis NOT DETECTED NOT DETECTED Final   Enterobacterales NOT DETECTED NOT DETECTED Final   Enterobacter cloacae complex NOT DETECTED NOT DETECTED Final   Escherichia coli NOT DETECTED NOT DETECTED Final   Klebsiella aerogenes NOT DETECTED NOT DETECTED Final   Klebsiella oxytoca NOT DETECTED NOT DETECTED Final   Klebsiella pneumoniae NOT DETECTED NOT DETECTED Final   Proteus species NOT DETECTED NOT DETECTED Final   Salmonella species NOT DETECTED NOT DETECTED Final   Serratia marcescens NOT DETECTED NOT DETECTED Final   Haemophilus influenzae NOT DETECTED NOT DETECTED Final   Neisseria meningitidis NOT DETECTED NOT DETECTED Final   Pseudomonas aeruginosa NOT DETECTED NOT DETECTED Final   Stenotrophomonas maltophilia NOT DETECTED NOT DETECTED Final   Candida albicans NOT DETECTED NOT DETECTED Final   Candida auris NOT DETECTED NOT DETECTED Final   Candida glabrata  NOT DETECTED NOT DETECTED Final   Candida krusei NOT DETECTED NOT DETECTED Final   Candida parapsilosis NOT DETECTED NOT DETECTED Final   Candida tropicalis NOT DETECTED NOT DETECTED Final   Cryptococcus neoformans/gattii NOT DETECTED NOT DETECTED Final   Meth resistant mecA/C and MREJ NOT DETECTED NOT DETECTED Final    Comment: Performed at [facility], [address] [place], [place] [address]  Resp Panel by RT-PCR (Flu A&B, Covid) Nasopharyngeal Swab     Status: None   Collection Time: 12/09/21 11:19 AM   Specimen: Nasopharyngeal Swab; Nasopharyngeal(NP) swabs in vial transport medium  Result Value Ref Range Status   SARS Coronavirus  2 by RT PCR NEGATIVE NEGATIVE Final    Comment: (NOTE) SARS-CoV-2 target nucleic acids are NOT DETECTED.  The SARS-CoV-2 RNA is generally detectable in upper respiratory specimens during the acute phase of infection. The lowest concentration of SARS-CoV-2 viral copies this assay can detect is 138 copies/mL. A negative result does not preclude SARS-Cov-2 infection and should not be used as the sole basis for treatment or other patient management decisions. A negative result may occur with  improper specimen collection/handling, submission of specimen other than nasopharyngeal swab, presence of viral mutation(s) within the areas targeted by this assay, and inadequate number of viral copies(<138 copies/mL). A negative result must be combined with clinical observations, patient history, and epidemiological information. The expected result is Negative.  Fact Sheet for Patients:  EntrepreneurPulse.com.au  Fact Sheet for Healthcare Providers:  IncredibleEmployment.be  This test is no t yet approved or cleared by the [place] FDA and  has been authorized for detection and/or diagnosis of SARS-CoV-2 by FDA under an Emergency Use Authorization (EUA). This EUA will remain  in effect (meaning this test can  be used) for the duration of the COVID-19 declaration under Section 564(b)(1) of the Act, 21 U.S.C.section 360bbb-3(b)(1), unless the authorization is terminated  or revoked sooner.       Influenza A by PCR NEGATIVE NEGATIVE Final   Influenza B by PCR NEGATIVE NEGATIVE Final    Comment: (NOTE) The Xpert Xpress SARS-CoV-2/FLU/RSV plus assay is intended as an aid in the diagnosis of influenza from Nasopharyngeal swab specimens and should not be used as a sole basis for treatment. Nasal washings and aspirates are unacceptable for Xpert Xpress SARS-CoV-2/FLU/RSV testing.  Fact Sheet for Patients: EntrepreneurPulse.com.au  Fact Sheet for Healthcare Providers: IncredibleEmployment.be  This test is not yet approved or cleared by the [place] FDA and has been authorized for detection and/or diagnosis of SARS-CoV-2 by FDA under an Emergency Use Authorization (EUA). This EUA will remain in effect (meaning this test can be used) for the duration of the COVID-19 declaration under Section 564(b)(1) of the Act, 21 U.S.C. section 360bbb-3(b)(1), unless the authorization is terminated or revoked.  Performed at [facility], [address] [place], [place] [address]   MRSA Next Gen by PCR, Nasal     Status: Abnormal   Collection Time: 12/09/21  1:24 PM   Specimen: Nasal Mucosa; Nasal Swab  Result Value Ref Range Status   MRSA by PCR Next Gen DETECTED (A) NOT DETECTED Final    Comment: RESULT CALLED TO, READ BACK BY AND VERIFIED WITH: JON COOK 1525 12/09/21 MU (NOTE) The GeneXpert MRSA Assay (FDA approved for NASAL specimens only), is one component of a comprehensive MRSA colonization surveillance program. It is not intended to diagnose MRSA infection nor to guide or monitor treatment for MRSA infections. Test performance is not FDA approved in patients less than 23 years old. Performed at [facility], [address],  [place], [place] [address]          Radiology Studies: CT CHEST WO CONTRAST  Result Date: 12/09/2021 CLINICAL DATA:  Shortness of breath. Generalized weakness. Recent fall. Sepsis protocol initiated by EMS. EXAM: CT CHEST WITHOUT CONTRAST TECHNIQUE: Multidetector CT imaging of the chest was performed following the standard protocol without IV contrast. RADIATION DOSE REDUCTION: This exam was performed according to the departmental dose-optimization program which includes automated exposure control, adjustment of the mA and/or kV according to patient size and/or use of iterative reconstruction technique. COMPARISON:  Portable  chest obtained earlier today. Chest CTA dated 11/07/2020. FINDINGS: Cardiovascular: Atheromatous calcifications, including the coronary arteries and aorta. Mildly enlarged heart, unchanged. Mediastinum/Nodes: No enlarged mediastinal or axillary lymph nodes. Thyroid gland, trachea, and esophagus demonstrate no significant findings. Lungs/Pleura: Bilateral dependent atelectasis. Stable parenchymal scarring in the posterolateral lingula. Minimal right upper lobe dependent atelectasis with improvement. No significant change in bilateral bullous changes in a centrilobular distribution. Interval minimal right pleural effusion. Small left pleural effusion without significant change. Stable pleural calcifications on the left. Upper Abdomen: Interval small amount of ascites. Contracted gallbladder with no significant change in multiple small calculi measuring up to 6 mm in maximum diameter each. No gallbladder wall thickening or pericholecystic fluid. Ingested tablet fragments in the stomach and duodenum. Musculoskeletal: Previously noted multiple lytic lesions in the bony skeleton compatible with previously reported multiple myeloma. Stable T10 fracture deformity without acute fracture lines. An oblique fracture is again demonstrated in the T7 vertebral body extending through the pedicle on the  right without additional posterior extension. IMPRESSION: 1. No interval findings suspicious for pneumonia. 2. Bilateral dependent atelectasis and lingular scarring. 3. Interval minimal right pleural effusion and stable small left pleural effusion. 4. Stable bony changes of multiple myeloma with an acute pathological fracture of the T7 vertebral body and old compression fracture of the T10 vertebral body. 5. Stable changes of COPD with centrilobular emphysema. 6. Stable dense calcific coronary artery and aortic atherosclerosis. 7. Cholelithiasis without evidence of cholecystitis. 8. Stable probable posttraumatic or postinflammatory left pleural calcifications. Aortic Atherosclerosis (ICD10-I70.0) and Emphysema (ICD10-J43.9). Electronically Signed   By: Claudie Revering M.D.   On: 12/09/2021 16:17   DG Chest Port 1 View  Result Date: 12/09/2021 CLINICAL DATA:  Shortness of breath. History of congestive heart failure, hypertension and diabetes. Questionable sepsis-evaluate for abnormality. EXAM: PORTABLE CHEST 1 VIEW COMPARISON:  Radiographs 12/01/2021 and 09/07/2021.  CT 11/07/2020. FINDINGS: 1058 hours. The heart size and mediastinal contours are stable status post median sternotomy and CABG. There is aortic atherosclerosis. There is chronic lung disease with asymmetric pleural thickening and parenchymal scarring on the left. Compared with the most recent radiographs, there are increased interstitial and ground-glass opacities throughout both lungs which could reflect superimposed mild edema. No confluent airspace opacity, pneumothorax or significant pleural effusion identified. Old rib fractures are present on the left. There is advanced arthropathy at both shoulders. IMPRESSION: Increased interstitial and ground-glass opacities throughout both lungs compared with most recent radiographs, suggesting possible mild edema. Underlying chronic pleuroparenchymal scarring as described. Electronically Signed   By: Richardean Sale M.D.   On: 12/09/2021 11:11        Scheduled Meds:  ascorbic acid  500 mg Oral Daily   aspirin  81 mg Oral Daily   calcium-vitamin D  1 tablet Oral BID   enoxaparin (LOVENOX) injection  30 mg Subcutaneous Q24H   insulin aspart  0-15 Units Subcutaneous TID WC   lidocaine  1 patch Transdermal Q24H   pantoprazole  40 mg Oral Daily   PARoxetine  10 mg Oral QHS   potassium chloride  80 mEq Oral Once   pravastatin  40 mg Oral q1800   Vitamin D (Ergocalciferol)  50,000 Units Oral Weekly   Continuous Infusions:  sodium chloride 5 mL/hr at 12/10/21 0412   ceFEPime (MAXIPIME) IV Stopped (12/10/21 0044)   [START ON 12/11/2021] vancomycin       LOS: 1 day    Time spent: 40 min    Desma Maxim, MD [facility]   If 7PM-7AM, please contact night-coverage [email or web address] Password [facility] 12/10/2021, 9:27 AM

## 2021-12-10 NOTE — Progress Notes (Signed)
SLP Cancellation Note  Patient Details Name: Luis [name]. MRN: [id number] DOB: November 28, 1945   Cancelled treatment:       SLP consult received and appreciated. Chart review completed. 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 Dr. Si Raider made aware. Will defer SLP efforts pending further esophageal dysphagia work up.   Cherrie Gauze, M.S., [facility] [phone] [place])  Quintella Baton 12/10/2021, 10:56 AM

## 2021-12-10 NOTE — Consult Note (Signed)
NAME: Luis [name].  DOB: 1946-04-14  MRN: [id number]  Date/Time: 12/10/2021 2:42 PM  REQUESTING PROVIDER: Dr.WOUK Subjective:  REASON FOR CONSULT: MSSA bacteremia ? Luis [name]. is a 76 y.o. male with a history of diabetes mellitus, coronary artery disease status post CABG, multiple myeloma, spinal stenosis with low back pain and leg pain, hypertension presented with shortness of breath and weakness for the past few days. Patient was also having cough with productive sputum When EMS arrived his pulse ox was 90 to 91% on room air and his blood pressure was in the 80s. Patient is also been complaining of bilateral lower extremity swelling. Is also had pruritus and has been scratching this skin He has wounds on his scalp On 12/09/2021 in the ED BP 99/48, temperature 98.7, respiratory rate 18, heart rate 88. WBC 10.8, Hb 11.5, platelets 149 and creatinine 2.08.  Blood cultures were sent.  Am seeing the patient because of blood cultures positive for staph aureus. Patient lives on his own His wife died 2 years ago He has low back pain due to spinal stenosis and lumbar radiculitis and gets steroid injection every 3 months.  Last injection was in October 21, 2021.  Past Medical History:  Diagnosis Date   Angina pectoris ([place])    Anxiety    Arthritis    CHF (congestive heart failure) ([place])    Coronary artery disease    Depression    Diabetes mellitus without complication ([place])    Patient takes Metformin.   Diverticulosis    GERD (gastroesophageal reflux disease)    Hyperlipidemia    Hypertension    Multiple myeloma ([place])    Multiple myeloma ([place]) 03/27/2015   Myocardial infarction [facility]) April 2001   widowmaker   Shortness of breath dyspnea    Sleep apnea    No CPAP @ present   Spinal stenosis    Squamous cell carcinoma of skin 02/19/2014   Left dorsal hand. WD SCC with superficial infiltration. Re-shaved 04/23/2014. Re-shaved 09/05/2014.   Squamous cell carcinoma of skin  09/08/2020   Left ant scalp. WD SCC.   Squamous cell carcinoma of skin 02/04/2021   R hand dorsum, EDC     Past Surgical History:  Procedure Laterality Date   CARDIAC CATHETERIZATION     CARPAL TUNNEL RELEASE     CATARACT EXTRACTION     COLONOSCOPY WITH PROPOFOL N/A 11/11/2015   Procedure: COLONOSCOPY WITH PROPOFOL;  Surgeon: Lollie Sails, MD;  Location: [facility] ENDOSCOPY;  Service: Endoscopy;  Laterality: N/A;   CORONARY ARTERY BYPASS GRAFT     EYE SURGERY Bilateral    Cataract Extraction   INGUINAL HERNIA REPAIR     JOINT REPLACEMENT Right 2008   Right Total Hip Replacement   PILONIDAL CYST EXCISION     ROTATOR CUFF REPAIR     TOTAL HIP ARTHROPLASTY Right    VENTRAL HERNIA REPAIR N/A 08/15/2015   Procedure: VENTRAL HERNIA REPAIR WITH MESH ;  Surgeon: Leonie Green, MD;  Location: [facility] ORS;  Service: General;  Laterality: N/A;    Social History   Socioeconomic History   Marital status: Married    Spouse name: Not on file   Number of children: Not on file   Years of education: Not on file   Highest education level: Not on file  Occupational History   Not on file  Tobacco Use   Smoking status: Former    Packs/day: 1.50    Years: 40.00  Pack years: 60.00    Types: Cigarettes    Quit date: 02/24/2000    Years since quitting: 21.8   Smokeless tobacco: Former    Quit date: 03/04/2000  Vaping Use   Vaping Use: Never used  Substance and Sexual Activity   Alcohol use: No   Drug use: No   Sexual activity: Not on file  Other Topics Concern   Not on file  Social History Narrative   Not on file   Social Determinants of Health   Financial Resource Strain: Not on file  Food Insecurity: Not on file  Transportation Needs: Not on file  Physical Activity: Not on file  Stress: Not on file  Social Connections: Not on file  Intimate Partner Violence: Not on file    Family History  Problem Relation Age of Onset   Heart disease Father    Stroke Mother    Prostate  cancer Maternal Grandfather 27   Allergies  Allergen Reactions   Pravachol [Pravastatin]    Pravastatin Sodium Other (See Comments)   Statins Other (See Comments)    Muscle aches   Zocor [Simvastatin] Other (See Comments)    Muscle aches   I? Current Facility-Administered Medications  Medication Dose Route Frequency Provider Last Rate Last Admin   0.9 %  sodium chloride infusion   Intravenous PRN Sharion Settler, NP 5 mL/hr at 12/10/21 0412 Infusion Verify at 12/10/21 0412   0.9 % NaCl with KCl 20 mEq/ L  infusion   Intravenous Continuous Gwynne Edinger, MD 100 mL/hr at 12/10/21 1050 New Bag at 12/10/21 1050   acetaminophen (TYLENOL) tablet 650 mg  650 mg Oral Q6H PRN Agbata, Tochukwu, MD   650 mg at 12/09/21 1805   Or   acetaminophen (TYLENOL) suppository 650 mg  650 mg Rectal Q6H PRN Agbata, Tochukwu, MD       ALPRAZolam Duanne Moron) tablet 0.25 mg  0.25 mg Oral QHS PRN Agbata, Tochukwu, MD   0.25 mg at 12/10/21 0021   ascorbic acid (VITAMIN C) tablet 500 mg  500 mg Oral Daily Agbata, Tochukwu, MD   500 mg at 12/10/21 6384   aspirin chewable tablet 81 mg  81 mg Oral Daily Agbata, Tochukwu, MD   81 mg at 12/10/21 6659   calcium-vitamin D (OSCAL WITH D) 500-5 MG-MCG per tablet 1 tablet  1 tablet Oral BID Agbata, Tochukwu, MD   1 tablet at 12/10/21 0939   ceFAZolin (ANCEF) IVPB 2g/100 mL premix  2 g Intravenous Q12H Wouk, Ailene Rud, MD 200 mL/hr at 12/10/21 1051 2 g at 12/10/21 1051   heparin injection 5,000 Units  5,000 Units Subcutaneous Q8H Wouk, Ailene Rud, MD       insulin aspart (novoLOG) injection 0-15 Units  0-15 Units Subcutaneous TID WC Agbata, Tochukwu, MD   3 Units at 12/10/21 1309   ipratropium-albuterol (DUONEB) 0.5-2.5 (3) MG/3ML nebulizer solution 3 mL  3 mL Nebulization Q6H PRN Agbata, Tochukwu, MD   3 mL at 12/10/21 1009   lidocaine (LIDODERM) 5 % 1 patch  1 patch Transdermal Q24H Sharion Settler, NP       loratadine (CLARITIN) tablet 10 mg  10 mg Oral Daily Gwynne Edinger, MD   10 mg at 12/10/21 1305   multivitamin with minerals tablet 1 tablet  1 tablet Oral Daily Wouk, Ailene Rud, MD   1 tablet at 12/10/21 1305   ondansetron (ZOFRAN) tablet 4 mg  4 mg Oral Q6H PRN Collier Bullock, MD   4  mg at 12/10/21 0017   Or   ondansetron (ZOFRAN) injection 4 mg  4 mg Intravenous Q6H PRN Agbata, Tochukwu, MD       pantoprazole (PROTONIX) EC tablet 40 mg  40 mg Oral Daily Agbata, Tochukwu, MD   40 mg at 12/10/21 6767   PARoxetine (PAXIL) tablet 10 mg  10 mg Oral QHS Agbata, Tochukwu, MD   10 mg at 12/09/21 2052   pravastatin (PRAVACHOL) tablet 40 mg  40 mg Oral q1800 Agbata, Tochukwu, MD   40 mg at 12/09/21 1804   Vitamin D (Ergocalciferol) (DRISDOL) capsule 50,000 Units  50,000 Units Oral Weekly Agbata, Tochukwu, MD         Abtx:  Anti-infectives (From admission, onward)    Start     Dose/Rate Route Frequency Ordered Stop   12/11/21 0100  vancomycin (VANCOREADY) IVPB 1250 mg/250 mL  Status:  Discontinued        1,250 mg 166.7 mL/hr over 90 Minutes Intravenous Every 36 hours 12/09/21 1739 12/10/21 0948   12/10/21 1045  ceFAZolin (ANCEF) IVPB 2g/100 mL premix        2 g 200 mL/hr over 30 Minutes Intravenous Every 12 hours 12/10/21 0948     12/10/21 0100  ceFEPIme (MAXIPIME) 2 g in sodium chloride 0.9 % 100 mL IVPB  Status:  Discontinued        2 g 200 mL/hr over 30 Minutes Intravenous Every 12 hours 12/09/21 1739 12/10/21 0948   12/09/21 1615  ceFEPIme (MAXIPIME) 2 g in sodium chloride 0.9 % 100 mL IVPB  Status:  Discontinued        2 g 200 mL/hr over 30 Minutes Intravenous  Once 12/09/21 1607 12/09/21 1608   12/09/21 1615  metroNIDAZOLE (FLAGYL) IVPB 500 mg  Status:  Discontinued        500 mg 100 mL/hr over 60 Minutes Intravenous Every 12 hours 12/09/21 1607 12/09/21 1608   12/09/21 1615  vancomycin (VANCOCIN) IVPB 1000 mg/200 mL premix  Status:  Discontinued        1,000 mg 200 mL/hr over 60 Minutes Intravenous  Once 12/09/21 1607 12/09/21 1608    12/09/21 1615  vancomycin (VANCOCIN) IVPB 1000 mg/200 mL premix  Status:  Discontinued        1,000 mg 200 mL/hr over 60 Minutes Intravenous  Once 12/09/21 1607 12/09/21 1608   12/09/21 1615  ceFEPIme (MAXIPIME) 2 g in sodium chloride 0.9 % 100 mL IVPB  Status:  Discontinued        2 g 200 mL/hr over 30 Minutes Intravenous  Once 12/09/21 1607 12/09/21 1608   12/09/21 1230  ceFEPIme (MAXIPIME) 2 g in sodium chloride 0.9 % 100 mL IVPB        2 g 200 mL/hr over 30 Minutes Intravenous  Once 12/09/21 1217 12/09/21 1300   12/09/21 1230  metroNIDAZOLE (FLAGYL) IVPB 500 mg        500 mg 100 mL/hr over 60 Minutes Intravenous  Once 12/09/21 1217 12/09/21 1332   12/09/21 1230  vancomycin (VANCOCIN) IVPB 1000 mg/200 mL premix  Status:  Discontinued        1,000 mg 200 mL/hr over 60 Minutes Intravenous  Once 12/09/21 1217 12/09/21 1219   12/09/21 1230  vancomycin (VANCOREADY) IVPB 2000 mg/400 mL        2,000 mg 200 mL/hr over 120 Minutes Intravenous  Once 12/09/21 1219 12/09/21 1601       REVIEW OF SYSTEMS:  Const: Subjective fever, negative chills, negative  weight loss Eyes: negative diplopia or visual changes, negative eye pain ENT: negative coryza, negative sore throat Resp: negative cough, hemoptysis, has dyspnea Cards: negative for chest pain, palpitations, has mild lower extremity edema GU: negative for frequency, dysuria and hematuria GI: Negative for abdominal pain, diarrhea, bleeding, constipation Skin: Excoriations Heme: negative for easy bruising and gum/nose bleeding MS: Generalized weakness Neurolo:negative for headaches, dizziness, vertigo, memory problems  Psych: anxiety, depression  Endocrine: , diabetes Allergy/Immunology-as above  Objective:  VITALS:  BP 127/64 (BP Location: Left Arm)    Pulse 87    Temp 97.8 F (36.6 C) (Oral)    Resp 20    Wt 83.5 kg    SpO2 96%    BMI 28.82 kg/m  PHYSICAL EXAM:  General: Alert, cooperative, no distress, appears stated age.   Head: Normocephalic, without obvious abnormality, atraumatic. Eyes: Conjunctivae clear, anicteric sclerae. Pupils are equal, bilateral ptosis ENT Nares normal. No drainage or sinus tenderness. Lips, mucosa, and tongue normal. No Thrush Neck: Supple, symmetrical, no adenopathy, thyroid: non tender no carotid bruit and no JVD. Back: No CVA tenderness. Lungs: Bilateral air entry.  Few basal crepitation Heart: Regular rate and rhythm, no murmur, rub or gallop. Abdomen: Soft, non-tender,not distended. Bowel sounds normal. No masses Extremities: Bilateral leg edema Multiple excoriations Skin: EXTR no lesions scattered Scalp ulceration       Lymph: Cervical, supraclavicular normal. Neurologic: Grossly non-focal Pertinent Labs Lab Results CBC    Component Value Date/Time   WBC 8.1 12/10/2021 0621   RBC 2.56 (L) 12/10/2021 0621   HGB 9.8 (L) 12/10/2021 0621   HGB 12.9 (L) 02/17/2015 0934   HCT 27.9 (L) 12/10/2021 0621   HCT 37.7 (L) 02/17/2015 0934   PLT 129 (L) 12/10/2021 0621   PLT 284 02/17/2015 0934   MCV 109.0 (H) 12/10/2021 0621   MCV 104 (H) 02/17/2015 0934   MCH 38.3 (H) 12/10/2021 0621   MCHC 35.1 12/10/2021 0621   RDW 15.8 (H) 12/10/2021 0621   RDW 13.5 02/17/2015 0934   LYMPHSABS 0.3 (L) 12/09/2021 1056   LYMPHSABS 0.9 (L) 02/17/2015 0934   MONOABS 0.7 12/09/2021 1056   MONOABS 0.4 02/17/2015 0934   EOSABS 0.4 12/09/2021 1056   EOSABS 0.5 02/17/2015 0934   BASOSABS 0.1 12/09/2021 1056   BASOSABS 0.1 02/17/2015 0934    CMP Latest Ref Rng & Units 12/10/2021 12/09/2021 12/01/2021  Glucose 70 - 99 mg/dL 117(H) 106(H) 114(H)  BUN 8 - 23 mg/dL 51(H) 43(H) 32(H)  Creatinine 0.61 - 1.24 mg/dL 2.16(H) 2.08(H) 1.96(H)  Sodium 135 - 145 mmol/L 132(L) 135 135  Potassium 3.5 - 5.1 mmol/L 2.9(L) 2.3(LL) 2.5(LL)  Chloride 98 - 111 mmol/L 96(L) 97(L) 86(L)  CO2 22 - 32 mmol/L 27 27 33(H)  Calcium 8.9 - 10.3 mg/dL 7.4(L) 7.6(L) 9.0  Total Protein 6.5 - 8.1 g/dL - 5.8(L) -   Total Bilirubin 0.3 - 1.2 mg/dL - 1.8(H) -  Alkaline Phos 38 - 126 U/L - 63 -  AST 15 - 41 U/L - 41 -  ALT 0 - 44 U/L - 16 -      Microbiology: Recent Results (from the past 240 hour(s))  Blood Culture (routine x 2)     Status: None (Preliminary result)   Collection Time: 12/09/21 10:57 AM   Specimen: BLOOD  Result Value Ref Range Status   Specimen Description BLOOD LEFT WRIST  Final   Special Requests   Final    BOTTLES DRAWN AEROBIC AND ANAEROBIC Blood Culture  adequate volume   Culture  Setup Time   Final    Organism ID to follow GRAM POSITIVE COCCI AEROBIC BOTTLE ONLY CRITICAL RESULT CALLED TO, READ BACK BY AND VERIFIED WITH: JASON ROBBINS @ 0149 ON 12/10/21.Marland KitchenMarland KitchenTKR Performed at [facility], [address] [place], [place] [address]    Culture [facility] POSITIVE COCCI  Final   Report Status PENDING  Incomplete  Blood Culture ID Panel (Reflexed)     Status: Abnormal   Collection Time: 12/09/21 10:57 AM  Result Value Ref Range Status   Enterococcus faecalis NOT DETECTED NOT DETECTED Final   Enterococcus Faecium NOT DETECTED NOT DETECTED Final   Listeria monocytogenes NOT DETECTED NOT DETECTED Final   Staphylococcus species DETECTED (A) NOT DETECTED Final    Comment: CRITICAL RESULT CALLED TO, READ BACK BY AND VERIFIED WITH: JASON ROBBINS @ 0149 ON 12/10/21.Marland KitchenMarland KitchenTKR    Staphylococcus aureus (BCID) DETECTED (A) NOT DETECTED Final    Comment: CRITICAL RESULT CALLED TO, READ BACK BY AND VERIFIED WITH: JASON ROBBINS @ 0149 ON 12/10/21.Marland KitchenMarland KitchenTKR    Staphylococcus epidermidis NOT DETECTED NOT DETECTED Final   Staphylococcus lugdunensis NOT DETECTED NOT DETECTED Final   Streptococcus species NOT DETECTED NOT DETECTED Final   Streptococcus agalactiae NOT DETECTED NOT DETECTED Final   Streptococcus pneumoniae NOT DETECTED NOT DETECTED Final   Streptococcus pyogenes NOT DETECTED NOT DETECTED Final   A.calcoaceticus-baumannii NOT DETECTED NOT DETECTED Final   Bacteroides fragilis NOT  DETECTED NOT DETECTED Final   Enterobacterales NOT DETECTED NOT DETECTED Final   Enterobacter cloacae complex NOT DETECTED NOT DETECTED Final   Escherichia coli NOT DETECTED NOT DETECTED Final   Klebsiella aerogenes NOT DETECTED NOT DETECTED Final   Klebsiella oxytoca NOT DETECTED NOT DETECTED Final   Klebsiella pneumoniae NOT DETECTED NOT DETECTED Final   Proteus species NOT DETECTED NOT DETECTED Final   Salmonella species NOT DETECTED NOT DETECTED Final   Serratia marcescens NOT DETECTED NOT DETECTED Final   Haemophilus influenzae NOT DETECTED NOT DETECTED Final   Neisseria meningitidis NOT DETECTED NOT DETECTED Final   Pseudomonas aeruginosa NOT DETECTED NOT DETECTED Final   Stenotrophomonas maltophilia NOT DETECTED NOT DETECTED Final   Candida albicans NOT DETECTED NOT DETECTED Final   Candida auris NOT DETECTED NOT DETECTED Final   Candida glabrata NOT DETECTED NOT DETECTED Final   Candida krusei NOT DETECTED NOT DETECTED Final   Candida parapsilosis NOT DETECTED NOT DETECTED Final   Candida tropicalis NOT DETECTED NOT DETECTED Final   Cryptococcus neoformans/gattii NOT DETECTED NOT DETECTED Final   Meth resistant mecA/C and MREJ NOT DETECTED NOT DETECTED Final    Comment: Performed at [facility], [address] [place], [place] [address]  Resp Panel by RT-PCR (Flu A&B, Covid) Nasopharyngeal Swab     Status: None   Collection Time: 12/09/21 11:19 AM   Specimen: Nasopharyngeal Swab; Nasopharyngeal(NP) swabs in vial transport medium  Result Value Ref Range Status   SARS Coronavirus 2 by RT PCR NEGATIVE NEGATIVE Final    Comment: (NOTE) SARS-CoV-2 target nucleic acids are NOT DETECTED.  The SARS-CoV-2 RNA is generally detectable in upper respiratory specimens during the acute phase of infection. The lowest concentration of SARS-CoV-2 viral copies this assay can detect is 138 copies/mL. A negative result does not preclude SARS-Cov-2 infection and should not be used as  the sole basis for treatment or other patient management decisions. A negative result may occur with  improper specimen collection/handling, submission of specimen other than nasopharyngeal swab, presence of viral  mutation(s) within the areas targeted by this assay, and inadequate number of viral copies(<138 copies/mL). A negative result must be combined with clinical observations, patient history, and epidemiological information. The expected result is Negative.  Fact Sheet for Patients:  EntrepreneurPulse.com.au  Fact Sheet for Healthcare Providers:  IncredibleEmployment.be  This test is no t yet approved or cleared by the [place] FDA and  has been authorized for detection and/or diagnosis of SARS-CoV-2 by FDA under an Emergency Use Authorization (EUA). This EUA will remain  in effect (meaning this test can be used) for the duration of the COVID-19 declaration under Section 564(b)(1) of the Act, 21 U.S.C.section 360bbb-3(b)(1), unless the authorization is terminated  or revoked sooner.       Influenza A by PCR NEGATIVE NEGATIVE Final   Influenza B by PCR NEGATIVE NEGATIVE Final    Comment: (NOTE) The Xpert Xpress SARS-CoV-2/FLU/RSV plus assay is intended as an aid in the diagnosis of influenza from Nasopharyngeal swab specimens and should not be used as a sole basis for treatment. Nasal washings and aspirates are unacceptable for Xpert Xpress SARS-CoV-2/FLU/RSV testing.  Fact Sheet for Patients: EntrepreneurPulse.com.au  Fact Sheet for Healthcare Providers: IncredibleEmployment.be  This test is not yet approved or cleared by the [place] FDA and has been authorized for detection and/or diagnosis of SARS-CoV-2 by FDA under an Emergency Use Authorization (EUA). This EUA will remain in effect (meaning this test can be used) for the duration of the COVID-19 declaration under Section 564(b)(1) of  the Act, 21 U.S.C. section 360bbb-3(b)(1), unless the authorization is terminated or revoked.  Performed at [facility], [address] [place], [place] [address]   MRSA Next Gen by PCR, Nasal     Status: Abnormal   Collection Time: 12/09/21  1:24 PM   Specimen: Nasal Mucosa; Nasal Swab  Result Value Ref Range Status   MRSA by PCR Next Gen DETECTED (A) NOT DETECTED Final    Comment: RESULT CALLED TO, READ BACK BY AND VERIFIED WITH: JON COOK 1525 12/09/21 MU (NOTE) The GeneXpert MRSA Assay (FDA approved for NASAL specimens only), is one component of a comprehensive MRSA colonization surveillance program. It is not intended to diagnose MRSA infection nor to guide or monitor treatment for MRSA infections. Test performance is not FDA approved in patients less than 51 years old. Performed at [facility], [address] [place],  [address]     IMAGING RESULTS:  I have personally reviewed the films ? Impression/Recommendation Staph aureus bacteremia.  Likely source could be the skin wounds on his scalp multiple excoriations all over Culture was sent from the scalp wound We will repeat blood cultures to see for elimination of bacteremia Will need 2D echo/TEE Patient has lumbar radiculitis with back pain He had received steroid injection a month ago Needs MRI to look for any discitis or osteomyelitis Continue cefazolin.   Multiple myeloma followed by Dr. Tasia Catchings  Hyperlipidemia on pravastatin  Diabetes mellitus on sliding scale  CAD status post CABG  History of right hip total arthroplasty   ? ___________________________________________________ Discussed with patient, and care team.  Note:  This document was prepared using Dragon voice recognition software and may include unintentional dictation errors.

## 2021-12-10 NOTE — Progress Notes (Signed)
Initial Nutrition Assessment  DOCUMENTATION CODES:  Non-severe (moderate) malnutrition in context of chronic illness  INTERVENTION:  Recommend liberalizing diet to regular.  Add Mighty Shake po TID, each supplement provides 220 kcal and 6 grams protein.   Add MVI with minerals daily.  Encourage PO and supplement intake.  NUTRITION DIAGNOSIS:  Moderate Malnutrition related to chronic illness (CAD) as evidenced by moderate fat depletion, moderate muscle depletion.  GOAL:  Patient will meet greater than or equal to 90% of their needs  MONITOR:  PO intake, Supplement acceptance, Diet advancement, Labs, Weight trends, Skin, I & O's  REASON FOR ASSESSMENT:  Malnutrition Screening Tool    ASSESSMENT:  76 yo male with a PMH of CAD s/p CABG, multiple myeloma, T2DM, CKD stage 3, HTN, anxiety, and depression who presents to the ER for evaluation of worsening shortness of breath from his baseline and weakness. Admitted with sepsis.  Spoke with pt and eldest son at bedside. Pt reports that he has not been eating very well recently and reports a so-so appetite. He reports that he enjoyed his breakfast today.  Son endorses that pt has been losing weight due to not eating so well.  Pt also complaining of loose stools at his facility he resides in.  Per Epic, pt has lost ~11 lbs (5.7%) in the past 3 months, which is not necessarily significant. However, admission weight appears stated. RD to order measured weight to determine any weight changes.  Given pt's good glycemic control, RD to recommend diet be liberalized to regular. RD messaged MD regarding this, and MD in agreement.  Medications: reviewed; Vitamin C, Os-Cal with Vitamin D, SSI, Protonix, Vitamin D 50000 units weekly, NaCl with K-Cl 30 mEq @ 100 ml/hr, Ancef BID, Xanax PO PRN (given once today), Zofran PO PRN (given once today)  Labs: reviewed; Na 132 (L), K 2.9 (L), CBG 83-155 HbA1c: 5.3% (11/08/2020)  NUTRITION - FOCUSED  PHYSICAL EXAM: Flowsheet Row Most Recent Value  Orbital Region Moderate depletion  Upper Arm Region Moderate depletion  Thoracic and Lumbar Region No depletion  Buccal Region Mild depletion  Temple Region Moderate depletion  Clavicle Bone Region Severe depletion  Clavicle and Acromion Bone Region Severe depletion  Scapular Bone Region Unable to assess  Dorsal Hand Mild depletion  Patellar Region Moderate depletion  Anterior Thigh Region Moderate depletion  Posterior Calf Region Moderate depletion  Edema (RD Assessment) None  Hair Reviewed  Eyes Reviewed  Mouth Reviewed  Skin Reviewed  Nails Reviewed   Diet Order:   Diet Order             Diet regular Room service appropriate? Yes with Assist; Fluid consistency: Thin  Diet effective now                  EDUCATION NEEDS:  Education needs have been addressed  Skin:  Skin Assessment: Reviewed RN Assessment (Abraisons, cellulitis)  Last BM:  no BM documented  Height:  Ht Readings from Last 1 Encounters:  12/01/21 '5\' 7"'  (1.702 m)   Weight:  Wt Readings from Last 1 Encounters:  12/09/21 83.5 kg   BMI:  Body mass index is 28.82 kg/m.  Estimated Nutritional Needs:  Kcal:  1900-2100 Protein:  105-120 grams Fluid:  >1.9 L  Derrel Nip, RD, LDN (she/her/hers) Clinical Inpatient Dietitian RD Pager/After-Hours/Weekend Pager # in [place]

## 2021-12-10 NOTE — TOC Initial Note (Signed)
Transition of Care (TOC) - Initial/Assessment Note    Patient Details  Name: Luis [name]. MRN: [id number] Date of Birth: [birth year]  Transition of Care [facility]) CM/SW Contact:    Alberteen Sam, LCSW Phone Number: 12/10/2021, 2:57 PM  Clinical Narrative:                  CSW spoke with patient's son Shanon Brow, confirmed patient is from [place]. Shanon Brow reports plan is for patient to return there at time of discharge and family will be hiring a CNA to visit patient at facility and keep an eye on him.   No questions or concerns at this time.   TOC will continue to follow for discharge planning.    Expected Discharge Plan:  (ILF) Barriers to Discharge: Continued Medical Work up   Patient Goals and CMS Choice Patient states their goals for this hospitalization and ongoing recovery are:: to go home CMS Medicare.gov Compare Post Acute Care list provided to:: Patient Represenative (must comment) (son Shanon Brow) Choice offered to / list presented to : Adult Children  Expected Discharge Plan and Services Expected Discharge Plan:  (ILF)                                              Prior Living Arrangements/Services   Lives with:: Self, Facility Resident                   Activities of Daily Living Home Assistive Devices/Equipment: CPAP, Walker (specify type) ADL Screening (condition at time of admission) Patient's cognitive ability adequate to safely complete daily activities?: Yes Is the patient deaf or have difficulty hearing?: No Does the patient have difficulty seeing, even when wearing glasses/contacts?: No Does the patient have difficulty concentrating, remembering, or making decisions?: Yes Patient able to express need for assistance with ADLs?: Yes Does the patient have difficulty dressing or bathing?: Yes Independently performs ADLs?: No Does the patient have difficulty walking or climbing stairs?: Yes Weakness of Legs: Both Weakness of  Arms/Hands: Both  Permission Sought/Granted                  Emotional Assessment              Admission diagnosis:  Sepsis ([place]) [A41.9] Acute on chronic congestive heart failure, unspecified heart failure type ([place]) [I50.9] Sepsis, due to unspecified organism, unspecified whether acute organ dysfunction present [facility]) [A41.9] Patient Active Problem List   Diagnosis Date Noted   Sepsis ([place]) 12/09/2021   Cellulitis 12/09/2021   Hypokalemia 12/09/2021   Pancytopenia ([place]) 12/16/2020   HCAP (healthcare-associated pneumonia) 11/07/2020   Severe sepsis ([place]) 11/07/2020   HLD (hyperlipidemia) 11/07/2020   GERD (gastroesophageal reflux disease) 11/07/2020   Depression with anxiety 11/07/2020   Chronic diastolic CHF (congestive heart failure) ([place]) 11/07/2020   Elevated troponin 11/07/2020   Atrial fibrillation with RVR ([place]) 11/07/2020   T7 vertebral fracture ([place]) 11/07/2020   Aspiration pneumonia ([place]) 11/07/2020   Squamous cell skin cancer 09/18/2020   Scalp lesion 08/24/2020   Maintenance chemotherapy 06/14/2020   COVID-19 virus infection (vaccinated [place] 01/2020) 06/14/2020   COVID-19 06/14/2020   Weight loss 11/22/2019   Compression fracture of body of thoracic vertebra ([place]) 09/19/2019   Macrocytic anemia 03/25/2019   Renal insufficiency 03/25/2019   Abnormal iron saturation 02/18/2019   Bilateral carotid artery stenosis  03/21/2018   Connective tissue and disc stenosis of intervertebral foramina of lumbar region 02/06/2018   Hip strain, initial encounter 02/06/2018   History of total right hip replacement 02/06/2018   Obesity (BMI 30-39.9) 02/06/2018   Goals of care, counseling/discussion 11/18/2017   Drug-induced neutropenia ([place]) 01/23/2017   Hypocalcemia 06/28/2016   Type 2 diabetes mellitus without complication, without long-term current use of insulin ([place]) 02/17/2016   Arthritis 05/01/2015   TI (tricuspid incompetence) 04/16/2015   Benign essential  HTN 04/04/2015   B12 deficiency 03/31/2015   Diarrhea 03/31/2015   Multiple myeloma ([place]) 03/27/2015   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   MI (mitral incompetence) 09/17/2014   Obstructive apnea 09/17/2014   Neuritis or radiculitis due to rupture of lumbar intervertebral disc 08/27/2014   Spinal stenosis 08/27/2014   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: [phone]: [phone] [facility] [address] [place]Phone: [phone]: [phone] [id number] [place] [place]: [phone]: [phone]   Social Determinants of Health (SDOH) Interventions    Readmission Risk Interventions No flowsheet data found.

## 2021-12-11 ENCOUNTER — Other Ambulatory Visit: Payer: Medicare Other

## 2021-12-11 ENCOUNTER — Inpatient Hospital Stay: Payer: Medicare Other

## 2021-12-11 DIAGNOSIS — N179 Acute kidney failure, unspecified: Secondary | ICD-10-CM | POA: Diagnosis not present

## 2021-12-11 DIAGNOSIS — E785 Hyperlipidemia, unspecified: Secondary | ICD-10-CM

## 2021-12-11 DIAGNOSIS — N189 Chronic kidney disease, unspecified: Secondary | ICD-10-CM

## 2021-12-11 DIAGNOSIS — B9561 Methicillin susceptible Staphylococcus aureus infection as the cause of diseases classified elsewhere: Secondary | ICD-10-CM | POA: Diagnosis not present

## 2021-12-11 DIAGNOSIS — R7881 Bacteremia: Secondary | ICD-10-CM | POA: Diagnosis not present

## 2021-12-11 DIAGNOSIS — E1122 Type 2 diabetes mellitus with diabetic chronic kidney disease: Secondary | ICD-10-CM

## 2021-12-11 DIAGNOSIS — A419 Sepsis, unspecified organism: Secondary | ICD-10-CM | POA: Diagnosis not present

## 2021-12-11 DIAGNOSIS — Z96641 Presence of right artificial hip joint: Secondary | ICD-10-CM

## 2021-12-11 DIAGNOSIS — C9000 Multiple myeloma not having achieved remission: Secondary | ICD-10-CM

## 2021-12-11 LAB — CBC
HCT: 33.2 % — ABNORMAL LOW (ref 39.0–52.0)
Hemoglobin: 11.4 g/dL — ABNORMAL LOW (ref 13.0–17.0)
MCH: 37.5 pg — ABNORMAL HIGH (ref 26.0–34.0)
MCHC: 34.3 g/dL (ref 30.0–36.0)
MCV: 109.2 fL — ABNORMAL HIGH (ref 80.0–100.0)
Platelets: 156 10*3/uL (ref 150–400)
RBC: 3.04 MIL/uL — ABNORMAL LOW (ref 4.22–5.81)
RDW: 16.5 % — ABNORMAL HIGH (ref 11.5–15.5)
WBC: 8.9 10*3/uL (ref 4.0–10.5)
nRBC: 0 % (ref 0.0–0.2)

## 2021-12-11 LAB — BASIC METABOLIC PANEL
Anion gap: 9 (ref 5–15)
BUN: 50 mg/dL — ABNORMAL HIGH (ref 8–23)
CO2: 23 mmol/L (ref 22–32)
Calcium: 7.3 mg/dL — ABNORMAL LOW (ref 8.9–10.3)
Chloride: 99 mmol/L (ref 98–111)
Creatinine, Ser: 2.07 mg/dL — ABNORMAL HIGH (ref 0.61–1.24)
GFR, Estimated: 32 mL/min — ABNORMAL LOW (ref >60)
Glucose, Bld: 124 mg/dL — ABNORMAL HIGH (ref 70–99)
Potassium: 4.4 mmol/L (ref 3.5–5.1)
Sodium: 131 mmol/L — ABNORMAL LOW (ref 135–145)

## 2021-12-11 LAB — GLUCOSE, CAPILLARY
Glucose-Capillary: 115 mg/dL — ABNORMAL HIGH (ref 70–99)
Glucose-Capillary: 126 mg/dL — ABNORMAL HIGH (ref 70–99)
Glucose-Capillary: 102 mg/dL — ABNORMAL HIGH (ref 70–99)
Glucose-Capillary: 119 mg/dL — ABNORMAL HIGH (ref 70–99)

## 2021-12-11 LAB — HEMOGLOBIN A1C
Hgb A1c MFr Bld: 6.1 % — ABNORMAL HIGH (ref 4.8–5.6)
Mean Plasma Glucose: 128 mg/dL

## 2021-12-11 LAB — BRAIN NATRIURETIC PEPTIDE: B Natriuretic Peptide: 722.8 pg/mL — ABNORMAL HIGH (ref 0.0–100.0)

## 2021-12-11 LAB — MAGNESIUM: Magnesium: 2.3 mg/dL (ref 1.7–2.4)

## 2021-12-11 MED ORDER — CEFAZOLIN SODIUM-DEXTROSE 2-4 GM/100ML-% IV SOLN
2.0000 g | Freq: Three times a day (TID) | INTRAVENOUS | Status: DC
  Filled 2021-12-11: qty 100

## 2021-12-11 MED ORDER — METOPROLOL TARTRATE 5 MG/5ML IV SOLN
2.5000 mg | Freq: Once | INTRAVENOUS | Status: DC
Start: 2021-12-12 — End: 2021-12-11

## 2021-12-11 MED ORDER — CEFAZOLIN SODIUM-DEXTROSE 2-4 GM/100ML-% IV SOLN
2.0000 g | Freq: Three times a day (TID) | INTRAVENOUS | Status: DC
  Administered 2021-12-11 – 2021-12-18 (×21): 2 g via INTRAVENOUS
  Filled 2021-12-11 (×23): qty 100

## 2021-12-11 MED ORDER — MUPIROCIN 2 % EX OINT
1.0000 "application " | TOPICAL_OINTMENT | Freq: Two times a day (BID) | CUTANEOUS | Status: AC
  Administered 2021-12-11 – 2021-12-15 (×6): 1 via NASAL
  Filled 2021-12-11: qty 22

## 2021-12-11 MED ORDER — ALPRAZOLAM 0.25 MG PO TABS
0.2500 mg | ORAL_TABLET | Freq: Two times a day (BID) | ORAL | Status: DC | PRN
  Administered 2021-12-11: 0.25 mg via ORAL
  Filled 2021-12-11: qty 1

## 2021-12-11 MED ORDER — CHLORHEXIDINE GLUCONATE CLOTH 2 % EX PADS
6.0000 | MEDICATED_PAD | Freq: Every day | CUTANEOUS | Status: AC
  Administered 2021-12-11 – 2021-12-15 (×5): 6 via TOPICAL

## 2021-12-11 MED ORDER — ALPRAZOLAM 0.5 MG PO TABS
0.5000 mg | ORAL_TABLET | Freq: Two times a day (BID) | ORAL | Status: DC | PRN
  Administered 2021-12-13 – 2021-12-16 (×2): 0.5 mg via ORAL
  Filled 2021-12-11 (×3): qty 1

## 2021-12-11 MED ORDER — PROPRANOLOL HCL 10 MG PO TABS
10.0000 mg | ORAL_TABLET | Freq: Two times a day (BID) | ORAL | Status: DC
  Administered 2021-12-12 – 2021-12-14 (×6): 10 mg via ORAL
  Filled 2021-12-11 (×10): qty 1

## 2021-12-11 NOTE — Progress Notes (Signed)
Notified Dr. Si Raider of patient increased cough with oral intake. Oral suction provided with Yankauer catheter. Diet changed to NPO, Dr. Si Raider to place new orders.    12/11/21 1826  Assess: MEWS Score  Temp 98.3 F (36.8 C)  BP 133/68  Pulse Rate (!) 119  ECG Heart Rate (!) 119  Resp 20  Level of Consciousness Alert  SpO2 93 %  O2 Device Nasal Cannula  O2 Flow Rate (L/min) 3 L/min  Assess: MEWS Score  MEWS Temp 0  MEWS Systolic 0  MEWS Pulse 2  MEWS RR 0  MEWS LOC 0  MEWS Score 2  MEWS Score Color Yellow  Assess: if the MEWS score is Yellow or Red  Were vital signs taken at a resting state? Yes  Focused Assessment Change from prior assessment (see assessment flowsheet)  Does the patient meet 2 or more of the SIRS criteria? No  MEWS guidelines implemented *See Row Information* Yes  Treat  MEWS Interventions Escalated (See documentation below)  Take Vital Signs  Increase Vital Sign Frequency  Yellow: Q 2hr X 2 then Q 4hr X 2, if remains yellow, continue Q 4hrs  Escalate  MEWS: Escalate Yellow: discuss with charge nurse/RN and consider discussing with provider and RRT  Notify: Charge Nurse/RN  Name of Charge Nurse/RN Notified Janett Billow Christmas  Date Charge Nurse/RN Notified 12/11/21  Time Charge Nurse/RN Notified 1830  Notify: Provider  Provider Name/Title Dr. Si Raider  Date Provider Notified 12/11/21  Time Provider Notified 2330  Notification Type Page  Notification Reason Change in status  Provider response See new orders;Evaluate remotely  Date of Provider Response 12/11/21  Time of Provider Response 1832  Assess: SIRS CRITERIA  SIRS Temperature  0  SIRS Pulse 1  SIRS Respirations  0  SIRS WBC 0  SIRS Score Sum  1

## 2021-12-11 NOTE — Care Management Important Message (Signed)
Important Message  Patient Details  Name: Luis [name]. MRN: [id number] Date of Birth: Sep 17, 1946   Medicare Important Message Given:  N/A - LOS <3 / Initial given by admissions     Dannette Barbara 12/11/2021, 8:55 AM

## 2021-12-11 NOTE — Progress Notes (Signed)
°   12/11/21 1207  Pain Assessment  Pain Scale 0-10  Pain Score 10  Pain Type Chronic pain  Pain Location Generalized  Pain Descriptors / Indicators Discomfort  Pain Frequency Constant  Pain Intervention(s) Medication (See eMAR);MD notified (Comment) (Dr. Si Raider sent message for pain orders, as only tylenol ordered)   Notified Dr. Si Raider of complaints of pain, only PRN pain medication ordered is Tylenol.  Provided with Tylenol and Dr. Si Raider stated he would address when he rounds on patient.

## 2021-12-11 NOTE — Progress Notes (Signed)
PROGRESS NOTE    Luis [name].  TYO:[id number] DOB: 1945/12/19 DOA: 12/09/2021 PCP: Tracie Harrier, MD  Outpatient Specialists: cardiology, dermatology, oncology    Brief Narrative:   From admission h and p Luis [name]. is a 76 y.o. male with medical history significant for coronary artery disease status post CABG, history of multiple myeloma, diabetes mellitus with stage III chronic kidney disease, hypertension, anxiety and depression who presents to the ER for evaluation of worsening shortness of breath from his baseline and weakness. Shortness of breath is mostly with exertion and is occasionally associated with a cough productive of clear to green phlegm.  He denies having any fever but has had chills. Per EMS patient's room air pulse oximetry was 90 to 91% and his blood pressure was in the 80s. He has bilateral lower extremity swelling and has noted increased redness involving his lower extremities. He denies having any headache, no dizziness, no lightheadedness, no abdominal pain, no nausea, no changes in his bowel habits, no urinary symptoms, no chest pain, no blood pressure no focal deficit. Sodium 135, potassium 2.3, chloride 97, bicarb 27, glucose 106, BUN 43, creatinine 2.08, calcium 7.6, alkaline phosphatase 63, albumin 2.1, AST 41, ALT 16, total protein 5.8, troponin 38, lactic acid 5.1 >> 4.3, PT 17.7, INR 1.5 Respiratory viral panel is negative Urine analysis is stable MRSA PCR was positive Chest x-ray reviewed by me shows increased additional groundglass opacities throughout both lungs suggesting possible mild edema.  Underlying chronic pleural-parenchymal scarring. Twelve-lead EKG shows atrial flutter with 2-1 block, PVCs and a right bundle branch block.   Assessment & Plan:   Principal Problem:   Sepsis ([place]) Active Problems:   Spinal stenosis   Type 2 diabetes mellitus without complication, without long-term current use of insulin (HCC)   CAD (coronary  artery disease) of artery bypass graft   Depression with anxiety   Atrial fibrillation with RVR (HCC)   Cellulitis   Hypokalemia   Malnutrition of moderate degree   # lower extremity cellulitis # Sepsis # Bacteremia Sepsis by tachycardia, lactic acidosis. S/p 1 liter in the ED. 1/4 BCs growing s aureus. ID following. Lower extremity erythema improving - f/u sensitivities - cont cefazolin - cont ivf - trend lactate  # Acute hypoxic respiratory failure Likely 2/2 sepsis on CHF, debility, a fib with rvr - d/c fluids - repeat cxr, concern for possible fluid overload - o2  # Acute kidney injury Cr 2.16 from baseline around 1.2. treated with IVF, stable today at 2.07. ua no blood, trace protein - pause fluids - f/u tte, renal u/s  # Hypokalemia # Hypomagenesemia Resolved - monitor  # dCHF # CAD s/p bypass graft No signs/symptoms ACS. Bnp markedly elevated on presentation. Patient reports lower extremity swelling is mild and improved from baseline so initially treated with fluids - f/u tte  # A fib with rvr RVR resolved. Not angicoagulated 2/2 frequent falls - cont home coreg  # HTN Here bp low normal - hold home losartan, cont coreg   # Multiple myeloma - followed by onc, not currently treated  # T7 compression fracture 2/2 MM.  - pain control  # Dysphagia Outpt plan was for dg esophagus w/ double lumen. Ordered today but patient too anxious for procedure, it was aborted - consider trying again vs outpt f/u        DVT prophylaxis: heparin Code Status: DNR, confirmed w/ son and patient 2/3 Family Communication: son updated @ bedside  Level of care: Progressive Status is: Inpatient Remains inpatient appropriate because: severity of illness            Consultants:  none  Procedures: none  Antimicrobials:  Vanc/cefepime>cefazolin    Subjective: anxious  Objective: Vitals:   12/11/21 0233 12/11/21 0413 12/11/21 0740 12/11/21 1124  BP:  (!) 109/59  (!) 146/81 121/70  Pulse: 91  94 96  Resp: _0 Temp: 97.7 F (36.5 C)  97.8 F (36.6 C) 97.9 F (36.6 C)  TempSrc:    Oral  SpO2: 93%  96% 97%  Weight:  82.5 kg      Intake/Output Summary (Last 24 hours) at 12/11/2021 1409 Last data filed at 12/11/2021 1125 Gross per 24 hour  Intake 2935.14 ml  Output 1850 ml  Net 1085.14 ml   Filed Weights   12/09/21 1050 12/11/21 0413  Weight: 83.5 kg 82.5 kg    Examination:  General exam: Appears calm and comfortable  Respiratory system: rales at bases Cardiovascular system: S1 & S2 heard, irreg irreg, soft systolic murmur No JVD, murmurs, rubs, gallops or clicks.  Gastrointestinal system: Abdomen is nondistended, soft and nontender. No organomegaly or masses felt. Normal bowel sounds heard. Central nervous system: Alert and oriented. No focal neurological deficits. Extremities: Symmetric 5 x 5 power. Skin: inerval mild impreovement in lower extremity erythema. Scabbs present. Ulcers on scalp bandaged Psychiatry: Judgement and insight appear normal. Mood & affect appropriate.     Data Reviewed: I have personally reviewed following labs and imaging studies  CBC: Recent Labs  Lab 12/09/21 1056 12/10/21 0621 12/11/21 0950  WBC 10.8* 8.1 8.9  NEUTROABS 9.3*  --   --   HGB 11.5* 9.8* 11.4*  HCT 33.1* 27.9* 33.2*  MCV 110.0* 109.0* 109.2*  PLT 149* 129* 594   Basic Metabolic Panel: Recent Labs  Lab 12/09/21 1305 12/10/21 0621 12/10/21 2125 12/11/21 0950  NA 135 132*  --  131*  K 2.3* 2.9*  --  4.4  CL 97* 96*  --  99  CO2 27 27  --  23  GLUCOSE 106* 117*  --  124*  BUN 43* 51*  --  50*  CREATININE 2.08* 2.16*  --  2.07*  CALCIUM 7.6* 7.4*  --  7.3*  MG 1.4* 2.5* 2.3 2.3   GFR: Estimated Creatinine Clearance: 30.7 mL/min (A) (by C-G formula based on SCr of 2.07 mg/dL (H)). Liver Function Tests: Recent Labs  Lab 12/09/21 1305  AST 41  ALT 16  ALKPHOS 63  BILITOT 1.8*  PROT 5.8*  ALBUMIN 2.1*    No results for input(s): LIPASE, AMYLASE in the last 168 hours. No results for input(s): AMMONIA in the last 168 hours. Coagulation Profile: Recent Labs  Lab 12/09/21 1208 12/10/21 0621  INR 1.5* 1.5*   Cardiac Enzymes: No results for input(s): CKTOTAL, CKMB, CKMBINDEX, TROPONINI in the last 168 hours. BNP (last 3 results) No results for input(s): PROBNP in the last 8760 hours. HbA1C: Recent Labs    12/09/21 1056  HGBA1C 6.1*   CBG: Recent Labs  Lab 12/10/21 1155 12/10/21 1621 12/10/21 2024 12/11/21 0741 12/11/21 1123  GLUCAP 156* 144* 99 115* 126*   Lipid Profile: No results for input(s): CHOL, HDL, LDLCALC, TRIG, CHOLHDL, LDLDIRECT in the last 72 hours. Thyroid Function Tests: No results for input(s): TSH, T4TOTAL, FREET4, T3FREE, THYROIDAB in the last 72 hours. Anemia Panel: No results for input(s): VITAMINB12, FOLATE, FERRITIN, TIBC, IRON, RETICCTPCT in the last 72  hours. Urine analysis:    Component Value Date/Time   COLORURINE YELLOW 12/09/2021 1119   APPEARANCEUR CLEAR (A) 12/09/2021 1119   APPEARANCEUR Clear 08/07/2013 0102   LABSPEC 1.015 12/09/2021 1119   LABSPEC 1.023 08/07/2013 0102   PHURINE 5.0 12/09/2021 1119   GLUCOSEU NEGATIVE 12/09/2021 1119   GLUCOSEU 50 mg/dL 08/07/2013 0102   HGBUR NEGATIVE 12/09/2021 1119   BILIRUBINUR NEGATIVE 12/09/2021 1119   BILIRUBINUR Negative 08/07/2013 0102   KETONESUR TRACE (A) 12/09/2021 1119   PROTEINUR TRACE (A) 12/09/2021 1119   NITRITE NEGATIVE 12/09/2021 1119   LEUKOCYTESUR NEGATIVE 12/09/2021 1119   LEUKOCYTESUR Negative 08/07/2013 0102   Sepsis Labs: _0 (procalcitonin:4,lacticidven:4)  ) Recent Results (from the past 240 hour(s))  Blood Culture (routine x 2)     Status: Abnormal (Preliminary result)   Collection Time: 12/09/21 10:57 AM   Specimen: BLOOD  Result Value Ref Range Status   Specimen Description   Final    BLOOD LEFT WRIST Performed at [facility], [address] [place], [place] [address]    Special Requests   Final    BOTTLES DRAWN AEROBIC AND ANAEROBIC Blood Culture adequate volume Performed at [facility], [address] [place], [place] [address]    Culture  Setup Time   Final    Organism ID to follow [place] CRITICAL RESULT CALLED TO, READ BACK BY AND VERIFIED WITH: JASON ROBBINS @ 0149 ON 12/10/21.Marland KitchenMarland KitchenTKR Performed at [facility], [address] [place], [place] [address]    Culture (A)  Final    STAPHYLOCOCCUS AUREUS SUSCEPTIBILITIES TO FOLLOW Performed at [facility], [place] [address] [place], [place] [address]    Report Status PENDING  Incomplete  Blood Culture ID Panel (Reflexed)     Status: Abnormal   Collection Time: 12/09/21 10:57 AM  Result Value Ref Range Status   Enterococcus faecalis NOT DETECTED NOT DETECTED Final   Enterococcus Faecium NOT DETECTED NOT DETECTED Final   Listeria monocytogenes NOT DETECTED NOT DETECTED Final   Staphylococcus species DETECTED (A) NOT DETECTED Final    Comment: CRITICAL RESULT CALLED TO, READ BACK BY AND VERIFIED WITH: JASON ROBBINS @ 0149 ON 12/10/21.Marland KitchenMarland KitchenTKR    Staphylococcus aureus (BCID) DETECTED (A) NOT DETECTED Final    Comment: CRITICAL RESULT CALLED TO, READ BACK BY AND VERIFIED WITH: JASON ROBBINS @ 0149 ON 12/10/21.Marland KitchenMarland KitchenTKR    Staphylococcus epidermidis NOT DETECTED NOT DETECTED Final   Staphylococcus lugdunensis NOT DETECTED NOT DETECTED Final   Streptococcus species NOT DETECTED NOT DETECTED Final   Streptococcus agalactiae NOT DETECTED NOT DETECTED Final   Streptococcus pneumoniae NOT DETECTED NOT DETECTED Final   Streptococcus pyogenes NOT DETECTED NOT DETECTED Final   A.calcoaceticus-baumannii NOT DETECTED NOT DETECTED Final   Bacteroides fragilis NOT DETECTED NOT DETECTED Final   Enterobacterales NOT DETECTED NOT DETECTED Final   Enterobacter cloacae complex NOT DETECTED NOT DETECTED Final   Escherichia coli NOT  DETECTED NOT DETECTED Final   Klebsiella aerogenes NOT DETECTED NOT DETECTED Final   Klebsiella oxytoca NOT DETECTED NOT DETECTED Final   Klebsiella pneumoniae NOT DETECTED NOT DETECTED Final   Proteus species NOT DETECTED NOT DETECTED Final   Salmonella species NOT DETECTED NOT DETECTED Final   Serratia marcescens NOT DETECTED NOT DETECTED Final   Haemophilus influenzae NOT DETECTED NOT DETECTED Final   Neisseria meningitidis NOT DETECTED NOT DETECTED Final   Pseudomonas aeruginosa NOT DETECTED NOT DETECTED Final   Stenotrophomonas maltophilia NOT DETECTED NOT DETECTED  Final   Candida albicans NOT DETECTED NOT DETECTED Final   Candida auris NOT DETECTED NOT DETECTED Final   Candida glabrata NOT DETECTED NOT DETECTED Final   Candida krusei NOT DETECTED NOT DETECTED Final   Candida parapsilosis NOT DETECTED NOT DETECTED Final   Candida tropicalis NOT DETECTED NOT DETECTED Final   Cryptococcus neoformans/gattii NOT DETECTED NOT DETECTED Final   Meth resistant mecA/C and MREJ NOT DETECTED NOT DETECTED Final    Comment: Performed at [facility], [address] [place], [place] [address]  Blood Culture (routine x 2)     Status: None (Preliminary result)   Collection Time: 12/09/21 10:59 AM   Specimen: BLOOD  Result Value Ref Range Status   Specimen Description BLOOD RIGHT [facility]  Final   Special Requests   Final    BOTTLES DRAWN AEROBIC AND ANAEROBIC Blood Culture adequate volume   Culture   Final    NO GROWTH 2 DAYS Performed at [facility], [address], [place], [place] [address]    Report Status PENDING  Incomplete  Urine Culture     Status: Abnormal (Preliminary result)   Collection Time: 12/09/21 11:19 AM   Specimen: In/Out Cath Urine  Result Value Ref Range Status   Specimen Description   Final    IN/OUT CATH URINE Performed at [facility], [address] [place], [place] [address]    Special Requests   Final    NONE Performed at [facility], [place], [place] [address]    Culture 1,000 COLONIES/mL ENTEROCOCCUS FAECALIS (A)  Final   Report Status PENDING  Incomplete  Resp Panel by RT-PCR (Flu A&B, Covid) Nasopharyngeal Swab     Status: None   Collection Time: 12/09/21 11:19 AM   Specimen: Nasopharyngeal Swab; Nasopharyngeal(NP) swabs in vial transport medium  Result Value Ref Range Status   SARS Coronavirus 2 by RT PCR NEGATIVE NEGATIVE Final    Comment: (NOTE) SARS-CoV-2 target nucleic acids are NOT DETECTED.  The SARS-CoV-2 RNA is generally detectable in upper respiratory specimens during the acute phase of infection. The lowest concentration of SARS-CoV-2 viral copies this assay can detect is 138 copies/mL. A negative result does not preclude SARS-Cov-2 infection and should not be used as the sole basis for treatment or other patient management decisions. A negative result may occur with  improper specimen collection/handling, submission of specimen other than nasopharyngeal swab, presence of viral mutation(s) within the areas targeted by this assay, and inadequate number of viral copies(<138 copies/mL). A negative result must be combined with clinical observations, patient history, and epidemiological information. The expected result is Negative.  Fact Sheet for Patients:  EntrepreneurPulse.com.au  Fact Sheet for Healthcare Providers:  IncredibleEmployment.be  This test is no t yet approved or cleared by the [place] FDA and  has been authorized for detection and/or diagnosis of SARS-CoV-2 by FDA under an Emergency Use Authorization (EUA). This EUA will remain  in effect (meaning this test can be used) for the duration of the COVID-19 declaration under Section 564(b)(1) of the Act, 21 U.S.C.section 360bbb-3(b)(1), unless the authorization is terminated  or revoked sooner.       Influenza A by PCR NEGATIVE NEGATIVE Final   Influenza B by PCR  NEGATIVE NEGATIVE Final    Comment: (NOTE) The Xpert Xpress SARS-CoV-2/FLU/RSV plus assay is intended as an aid in the diagnosis of influenza from Nasopharyngeal swab specimens and should not be used as a sole basis for treatment.  Nasal washings and aspirates are unacceptable for Xpert Xpress SARS-CoV-2/FLU/RSV testing.  Fact Sheet for Patients: EntrepreneurPulse.com.au  Fact Sheet for Healthcare Providers: IncredibleEmployment.be  This test is not yet approved or cleared by the [place] FDA and has been authorized for detection and/or diagnosis of SARS-CoV-2 by FDA under an Emergency Use Authorization (EUA). This EUA will remain in effect (meaning this test can be used) for the duration of the COVID-19 declaration under Section 564(b)(1) of the Act, 21 U.S.C. section 360bbb-3(b)(1), unless the authorization is terminated or revoked.  Performed at [facility], [address] [place], [place] [address]   MRSA Next Gen by PCR, Nasal     Status: Abnormal   Collection Time: 12/09/21  1:24 PM   Specimen: Nasal Mucosa; Nasal Swab  Result Value Ref Range Status   MRSA by PCR Next Gen DETECTED (A) NOT DETECTED Final    Comment: RESULT CALLED TO, READ BACK BY AND VERIFIED WITH: JON COOK 1525 12/09/21 MU (NOTE) The GeneXpert MRSA Assay (FDA approved for NASAL specimens only), is one component of a comprehensive MRSA colonization surveillance program. It is not intended to diagnose MRSA infection nor to guide or monitor treatment for MRSA infections. Test performance is not FDA approved in patients less than 44 years old. Performed at [facility] [address], [place], [place] [address]   Aerobic Culture w Gram Stain (superficial specimen)     Status: None (Preliminary result)   Collection Time: 12/10/21  5:31 PM   Specimen: SCALP; Wound  Result Value Ref Range Status   Specimen Description   Final    SCALP Performed  at [facility], [address] [place], [place] [address]    Special Requests   Final    NONE Performed at [facility], [address] [place], [place] [address]    Gram Stain   Final    FEW WBC PRESENT, PREDOMINANTLY MONONUCLEAR MODERATE GRAM POSITIVE COCCI    Culture   Final    CULTURE REINCUBATED FOR BETTER GROWTH Performed at [facility], [place] [address], [place], [place] [address]    Report Status PENDING  Incomplete         Radiology Studies: CT CHEST WO CONTRAST  Result Date: 12/09/2021 CLINICAL DATA:  Shortness of breath. Generalized weakness. Recent fall. Sepsis protocol initiated by EMS. EXAM: CT CHEST WITHOUT CONTRAST TECHNIQUE: Multidetector CT imaging of the chest was performed following the standard protocol without IV contrast. RADIATION DOSE REDUCTION: This exam was performed according to the departmental dose-optimization program which includes automated exposure control, adjustment of the mA and/or kV according to patient size and/or use of iterative reconstruction technique. COMPARISON:  Portable chest obtained earlier today. Chest CTA dated 11/07/2020. FINDINGS: Cardiovascular: Atheromatous calcifications, including the coronary arteries and aorta. Mildly enlarged heart, unchanged. Mediastinum/Nodes: No enlarged mediastinal or axillary lymph nodes. Thyroid gland, trachea, and esophagus demonstrate no significant findings. Lungs/Pleura: Bilateral dependent atelectasis. Stable parenchymal scarring in the posterolateral lingula. Minimal right upper lobe dependent atelectasis with improvement. No significant change in bilateral bullous changes in a centrilobular distribution. Interval minimal right pleural effusion. Small left pleural effusion without significant change. Stable pleural calcifications on the left. Upper Abdomen: Interval small amount of ascites. Contracted gallbladder with no significant change in multiple small calculi measuring up  to 6 mm in maximum diameter each. No gallbladder wall thickening or pericholecystic fluid. Ingested tablet fragments in the stomach and duodenum. Musculoskeletal: Previously noted multiple lytic lesions in  the bony skeleton compatible with previously reported multiple myeloma. Stable T10 fracture deformity without acute fracture lines. An oblique fracture is again demonstrated in the T7 vertebral body extending through the pedicle on the right without additional posterior extension. IMPRESSION: 1. No interval findings suspicious for pneumonia. 2. Bilateral dependent atelectasis and lingular scarring. 3. Interval minimal right pleural effusion and stable small left pleural effusion. 4. Stable bony changes of multiple myeloma with an acute pathological fracture of the T7 vertebral body and old compression fracture of the T10 vertebral body. 5. Stable changes of COPD with centrilobular emphysema. 6. Stable dense calcific coronary artery and aortic atherosclerosis. 7. Cholelithiasis without evidence of cholecystitis. 8. Stable probable posttraumatic or postinflammatory left pleural calcifications. Aortic Atherosclerosis (ICD10-I70.0) and Emphysema (ICD10-J43.9). Electronically Signed   By: Claudie Revering M.D.   On: 12/09/2021 16:17        Scheduled Meds:  ascorbic acid  500 mg Oral Daily   aspirin  81 mg Oral Daily   calcium-vitamin D  1 tablet Oral BID   Chlorhexidine Gluconate Cloth  6 each Topical Q0600   heparin  5,000 Units Subcutaneous Q8H   insulin aspart  0-15 Units Subcutaneous TID WC   lidocaine  1 patch Transdermal Q24H   loratadine  10 mg Oral Daily   multivitamin with minerals  1 tablet Oral Daily   mupirocin ointment  1 application Nasal BID   pantoprazole  40 mg Oral Daily   PARoxetine  10 mg Oral QHS   pravastatin  40 mg Oral q1800   Vitamin D (Ergocalciferol)  50,000 Units Oral Weekly   Continuous Infusions:  sodium chloride Stopped (12/10/21 1042)   0.9 % NaCl with KCl 20 mEq / L  100 mL/hr at 12/11/21 0818    ceFAZolin (ANCEF) IV 2 g (12/11/21 1152)     LOS: 2 days    Time spent: 35 min    Desma Maxim, MD [facility]   If 7PM-7AM, please contact night-coverage [email or web address] Password [email or web address] 12/11/2021, 2:09 PM

## 2021-12-11 NOTE — Progress Notes (Signed)
Cross Cover Patients home dose of propranolol restarted for his a fib/flutter with RVR/ST rhythms

## 2021-12-11 NOTE — Progress Notes (Signed)
Date of Admission:  12/09/2021      ID: Luis [name]. is a 76 y.o. male Principal Problem:   Sepsis ([place]) Active Problems:   Spinal stenosis   Type 2 diabetes mellitus without complication, without long-term current use of insulin (HCC)   CAD (coronary artery disease) of artery bypass graft   Depression with anxiety   Atrial fibrillation with RVR (HCC)   Cellulitis   Hypokalemia   Malnutrition of moderate degree    Subjective: Pt says he is feeling okay  Medications:   ascorbic acid  500 mg Oral Daily   aspirin  81 mg Oral Daily   calcium-vitamin D  1 tablet Oral BID   Chlorhexidine Gluconate Cloth  6 each Topical Q0600   heparin  5,000 Units Subcutaneous Q8H   insulin aspart  0-15 Units Subcutaneous TID WC   lidocaine  1 patch Transdermal Q24H   loratadine  10 mg Oral Daily   multivitamin with minerals  1 tablet Oral Daily   mupirocin ointment  1 application Nasal BID   pantoprazole  40 mg Oral Daily   PARoxetine  10 mg Oral QHS   pravastatin  40 mg Oral q1800   Vitamin D (Ergocalciferol)  50,000 Units Oral Weekly    Objective: Vital signs in last 24 hours: Temp:  [97.6 F (36.4 C)-99.5 F (37.5 C)] 97.9 F (36.6 C) (02/03 1124) Pulse Rate:  [80-96] 96 (02/03 1124) Resp:  [15-20] 17 (02/03 1124) BP: (106-146)/(55-81) 121/70 (02/03 1124) SpO2:  [91 %-97 %] 97 % (02/03 1124) Weight:  [82.5 kg] 82.5 kg (02/03 0413)  PHYSICAL EXAM:  General: Alert, cooperative, no distress, appears stated age.  Head: ulcerating wound scalp. Eyes:ptosis- rt > left ENT Nares normal. No drainage or sinus tenderness. Lips, mucosa, and tongue normal. No Thrush Neck: Supple, symmetrical, no adenopathy, thyroid: non tender no carotid bruit and no JVD. Back: No CVA tenderness. Lungs: b/l air entry Heart: regular Abdomen: Soft, non-tender,not distended. Bowel sounds normal. No masses Extremities:multiple excoriations legs       Skin: as above Lymph: Cervical,  supraclavicular normal. Neurologic: Grossly non-focal  Lab Results Recent Labs    12/10/21 0621 12/11/21 0950  WBC 8.1 8.9  HGB 9.8* 11.4*  HCT 27.9* 33.2*  NA 132* 131*  K 2.9* 4.4  CL 96* 99  CO2 27 23  BUN 51* 50*  CREATININE 2.16* 2.07*   Liver Panel Recent Labs    12/09/21 1305  PROT 5.8*  ALBUMIN 2.1*  AST 41  ALT 16  ALKPHOS 63  BILITOT 1.8*   Sedimentation Rate No results for input(s): ESRSEDRATE in the last 72 hours. C-Reactive Protein No results for input(s): CRP in the last 72 hours.  Microbiology:  Studies/Results: CT CHEST WO CONTRAST  Result Date: 12/09/2021 CLINICAL DATA:  Shortness of breath. Generalized weakness. Recent fall. Sepsis protocol initiated by EMS. EXAM: CT CHEST WITHOUT CONTRAST TECHNIQUE: Multidetector CT imaging of the chest was performed following the standard protocol without IV contrast. RADIATION DOSE REDUCTION: This exam was performed according to the departmental dose-optimization program which includes automated exposure control, adjustment of the mA and/or kV according to patient size and/or use of iterative reconstruction technique. COMPARISON:  Portable chest obtained earlier today. Chest CTA dated 11/07/2020. FINDINGS: Cardiovascular: Atheromatous calcifications, including the coronary arteries and aorta. Mildly enlarged heart, unchanged. Mediastinum/Nodes: No enlarged mediastinal or axillary lymph nodes. Thyroid gland, trachea, and esophagus demonstrate no significant findings. Lungs/Pleura: Bilateral dependent atelectasis. Stable parenchymal  scarring in the posterolateral lingula. Minimal right upper lobe dependent atelectasis with improvement. No significant change in bilateral bullous changes in a centrilobular distribution. Interval minimal right pleural effusion. Small left pleural effusion without significant change. Stable pleural calcifications on the left. Upper Abdomen: Interval small amount of ascites. Contracted gallbladder  with no significant change in multiple small calculi measuring up to 6 mm in maximum diameter each. No gallbladder wall thickening or pericholecystic fluid. Ingested tablet fragments in the stomach and duodenum. Musculoskeletal: Previously noted multiple lytic lesions in the bony skeleton compatible with previously reported multiple myeloma. Stable T10 fracture deformity without acute fracture lines. An oblique fracture is again demonstrated in the T7 vertebral body extending through the pedicle on the right without additional posterior extension. IMPRESSION: 1. No interval findings suspicious for pneumonia. 2. Bilateral dependent atelectasis and lingular scarring. 3. Interval minimal right pleural effusion and stable small left pleural effusion. 4. Stable bony changes of multiple myeloma with an acute pathological fracture of the T7 vertebral body and old compression fracture of the T10 vertebral body. 5. Stable changes of COPD with centrilobular emphysema. 6. Stable dense calcific coronary artery and aortic atherosclerosis. 7. Cholelithiasis without evidence of cholecystitis. 8. Stable probable posttraumatic or postinflammatory left pleural calcifications. Aortic Atherosclerosis (ICD10-I70.0) and Emphysema (ICD10-J43.9). Electronically Signed   By: Claudie Revering M.D.   On: 12/09/2021 16:17     Assessment/Plan:  Staph aureus bacteremia.  Likely source is the skin wounds.  He is got ulcerating lesions on his scalp and his legs.  Culture was taken of the scalp wound.  Gram stain shows gram-positive cocci.  Repeat blood culture has been sent.  2D echo was done today.  May need TEE depending on the results Patient has lumbar stenosis and pain in his back and legs.  Would recommend getting MRI Continue cefazolin  AKI on CKD Ultrasound done today did not show any hydronephrosis or bladder distention.  CAD status post CABG Atrial flutter  Hyperlipidemia on pravastatin  Multiple myeloma  Diabetes mellitus  on sliding scale  History of right total hip arthroplasty next ID will follow him peripherally this weekend.  Call if needed.

## 2021-12-12 ENCOUNTER — Inpatient Hospital Stay: Payer: Medicare Other

## 2021-12-12 ENCOUNTER — Inpatient Hospital Stay
Admit: 2021-12-12 | Discharge: 2021-12-12 | Disposition: A | Discharge status: Home / Self Care | Payer: Medicare Other | Attending: Obstetrics and Gynecology | Admitting: Obstetrics and Gynecology

## 2021-12-12 LAB — MAGNESIUM: Magnesium: 2.2 mg/dL (ref 1.7–2.4)

## 2021-12-12 LAB — BASIC METABOLIC PANEL
Anion gap: 9 (ref 5–15)
BUN: 45 mg/dL — ABNORMAL HIGH (ref 8–23)
CO2: 23 mmol/L (ref 22–32)
Calcium: 7.3 mg/dL — ABNORMAL LOW (ref 8.9–10.3)
Chloride: 101 mmol/L (ref 98–111)
Creatinine, Ser: 1.97 mg/dL — ABNORMAL HIGH (ref 0.61–1.24)
GFR, Estimated: 34 mL/min — ABNORMAL LOW (ref >60)
Glucose, Bld: 101 mg/dL — ABNORMAL HIGH (ref 70–99)
Potassium: 4.3 mmol/L (ref 3.5–5.1)
Sodium: 133 mmol/L — ABNORMAL LOW (ref 135–145)

## 2021-12-12 LAB — ECHOCARDIOGRAM COMPLETE
AR max vel: 1.57 cm2
AV Peak grad: 5.5 mmHg
Ao pk vel: 1.17 m/s
Area-P 1/2: 3.34 cm2
S' Lateral: 3.5 cm
Single Plane A4C EF: 55.9 %
Weight: 2910.07 oz

## 2021-12-12 LAB — GLUCOSE, CAPILLARY
Glucose-Capillary: 129 mg/dL — ABNORMAL HIGH (ref 70–99)
Glucose-Capillary: 120 mg/dL — ABNORMAL HIGH (ref 70–99)
Glucose-Capillary: 145 mg/dL — ABNORMAL HIGH (ref 70–99)
Glucose-Capillary: 123 mg/dL — ABNORMAL HIGH (ref 70–99)

## 2021-12-12 LAB — URINE CULTURE: Culture: 1000 — AB

## 2021-12-12 LAB — CULTURE, BLOOD (ROUTINE X 2): Special Requests: ADEQUATE

## 2021-12-12 LAB — CBC
HCT: 32.2 % — ABNORMAL LOW (ref 39.0–52.0)
Hemoglobin: 10.8 g/dL — ABNORMAL LOW (ref 13.0–17.0)
MCH: 37.2 pg — ABNORMAL HIGH (ref 26.0–34.0)
MCHC: 33.5 g/dL (ref 30.0–36.0)
MCV: 111 fL — ABNORMAL HIGH (ref 80.0–100.0)
Platelets: 172 10*3/uL (ref 150–400)
RBC: 2.9 MIL/uL — ABNORMAL LOW (ref 4.22–5.81)
RDW: 16.9 % — ABNORMAL HIGH (ref 11.5–15.5)
WBC: 6.7 10*3/uL (ref 4.0–10.5)
nRBC: 0 % (ref 0.0–0.2)

## 2021-12-12 MED ORDER — FUROSEMIDE 10 MG/ML IJ SOLN
40.0000 mg | Freq: Once | INTRAMUSCULAR | Status: AC
  Administered 2021-12-12: 40 mg via INTRAVENOUS
  Filled 2021-12-12: qty 4

## 2021-12-12 MED ORDER — ALBUMIN HUMAN 25 % IV SOLN
25.0000 g | Freq: Once | INTRAVENOUS | Status: AC
  Administered 2021-12-12: 25 g via INTRAVENOUS
  Filled 2021-12-12: qty 100

## 2021-12-12 MED ORDER — FUROSEMIDE 10 MG/ML IJ SOLN
60.0000 mg | Freq: Once | INTRAMUSCULAR | Status: AC
Start: 2021-12-12 — End: 2021-12-12
  Administered 2021-12-12: 60 mg via INTRAVENOUS
  Filled 2021-12-12: qty 6

## 2021-12-12 NOTE — Progress Notes (Signed)
Cross Cover Informed by cardiology severe dilation of right ventricle and pulmonary hypertension likely not PE but overload Blood pressures are borderline low and albumin low.  Will try albumin dose to improve pressures followed by lasix

## 2021-12-12 NOTE — Evaluation (Signed)
Clinical/Bedside Swallow Evaluation Patient Details  Name: Luis [name]. MRN: [id number] Date of Birth: 1946/05/18  Today's Date: 12/12/2021 Time: SLP Start Time (ACUTE ONLY): 1340 SLP Stop Time (ACUTE ONLY): 1440 SLP Time Calculation (min) (ACUTE ONLY): 60 min  Past Medical History:  Past Medical History:  Diagnosis Date   Angina pectoris ([place])    Anxiety    Arthritis    CHF (congestive heart failure) (HCC)    Coronary artery disease    Depression    Diabetes mellitus without complication ([place])    Patient takes Metformin.   Diverticulosis    GERD (gastroesophageal reflux disease)    Hyperlipidemia    Hypertension    Multiple myeloma ([place])    Multiple myeloma ([place]) 03/27/2015   Myocardial infarction [facility]) April 2001   widowmaker   Shortness of breath dyspnea    Sleep apnea    No CPAP @ present   Spinal stenosis    Squamous cell carcinoma of skin 02/19/2014   Left dorsal hand. WD SCC with superficial infiltration. Re-shaved 04/23/2014. Re-shaved 09/05/2014.   Squamous cell carcinoma of skin 09/08/2020   Left ant scalp. WD SCC.   Squamous cell carcinoma of skin 02/04/2021   R hand dorsum, EDC    Past Surgical History:  Past Surgical History:  Procedure Laterality Date   CARDIAC CATHETERIZATION     CARPAL TUNNEL RELEASE     CATARACT EXTRACTION     COLONOSCOPY WITH PROPOFOL N/A 11/11/2015   Procedure: COLONOSCOPY WITH PROPOFOL;  Surgeon: Lollie Sails, MD;  Location: [facility] ENDOSCOPY;  Service: Endoscopy;  Laterality: N/A;   CORONARY ARTERY BYPASS GRAFT     EYE SURGERY Bilateral    Cataract Extraction   INGUINAL HERNIA REPAIR     JOINT REPLACEMENT Right 2008   Right Total Hip Replacement   PILONIDAL CYST EXCISION     ROTATOR CUFF REPAIR     TOTAL HIP ARTHROPLASTY Right    VENTRAL HERNIA REPAIR N/A 08/15/2015   Procedure: VENTRAL HERNIA REPAIR WITH MESH ;  Surgeon: Leonie Green, MD;  Location: [facility] ORS;  Service: General;  Laterality: N/A;   HPI:  Pt is a  76 y.o. male with medical history significant for coronary artery disease status post CABG, history of multiple myeloma, diabetes mellitus with stage III chronic kidney disease, hypertension, anxiety and depression who presents to the ER for evaluation of worsening shortness of breath from his baseline and weakness.  Shortness of breath is mostly with exertion and is occasionally associated with a cough productive of clear to green phlegm.    Chest x-ray reviewed by MD shows increased additional groundglass opacities throughout both lungs suggesting possible mild edema.  Underlying chronic pleural-parenchymal scarring.  OF NOTE: in 11/2020: He endorsed having difficulty swallowing "certain solid foods"(meats/breads); he denied any difficulty swallowing water at home(but stated he drinks more "Sodas"). He was seen by ST services then who recommended f/u w/ GI -- no Imaging noted per chart.  At this admit, ST services recommended to MD for a DG Esophagus.  A DG Esophagus was performed on 12/11/2021 revealing: "Markedly patulous esophagus above the GE junction with extensive  tertiary contractions throughout the exam. "Corkscrewing" of the  esophagus as well was noted with limited peristaltic activity  distally though there was emptying of the esophagus despite  segmental narrowing of the distal esophagus into the stomach.  Findings could relate to distal esophageal stricture but could not  be well assessed due to the debilitated nature  of the patient.  Marked dysmotility and the long segment narrowing with beaked  appearance raising the question achalasia. Esophageal manometry and  endoscopy may be helpful to narrow the differential.:.  Unsure of pt'f Full insight/awareness of his Esophageal phase deficits and impact on swallowing including risk for aspiration of REFLUX material.    Assessment / Plan / Recommendation  Clinical Impression  Pt seen today for BSE. Pt was seen by ST services in 11/2020 when pt c/o  difficulty swallowing "solids"; having difficulty w/ certain solid foods. Pt was recommended to f/u w/ GI then, but no Imaging/Procedures noted in chart.   Pt appears to present w/ grossly adequate oropharyngeal phase swallow function w/ No overt oropharyngeal phase dysphagia noted, No observed neuromuscular deficits noted. Pt consumed po trials w/ No immediate, overt clinical s/s of aspiration during po trials. Pt appears at reduced risk for aspiration from an oropharyngeal phase standpoint when following general aspiration precautions.   HOWEVER -- At this admit, ST services recommended to MD for a DG Esophagus d/t pt's reported c/o again of difficulty w/ "solids" and a pending DG Esophagus pt was suppose to have completed w/ GI Outpatient. A DG Esophagus was performed Inpatient on 12/11/2021 revealing: "Markedly patulous esophagus above the GE junction with extensive  tertiary contractions throughout the exam. "Corkscrewing" of the  esophagus as well was noted with limited peristaltic activity distally though there was emptying of the esophagus despite  segmental narrowing of the distal esophagus into the stomach.  Findings could relate to distal esophageal stricture but could not  be well assessed due to the debilitated nature of the patient.  Marked dysmotility and the long segment narrowing with beaked  appearance raising the question achalasia. Esophageal manometry and  endoscopy may be helpful to narrow the differential.:.  Unsure of pt'f Full insight/awareness of his Esophageal phase deficits and impact on swallowing including risk for aspiration of REFLUX material.  Pt is at [place] for aspiration of Retrograde backflow of Reflux material not clearing the Esophagus fully. REFLUX aspiration.  Pt endorsed he has "difficulty w/ certain solid foods" and agreed w/ SLP's suggestion of tough meats, breads. Discussed the bulky nature of these foods and the potential slower motility throught the  Esophagus,  especially in light of "Corkscrewing" of the  esophagus as well was noted with limited peristaltic activity distally. Much TIME was spent on Education w/ both pt and Family in room on food consistencies and recommendations for modifying the foods for ease of Esophageal clearing. Pt described eating "steak" was "fine" when he puts "steak sauce" on it, even though he endorsed "food coming back up". Unsure of insight/awareness of progressing deficits and their impact; full Cognitive awareness to problem-solve.  During po trials, pt consumed all consistencies w/ no immediate, overt coughing, no sustained decline in vocal quality(mild wetness x1-2 b/t trials), or change in respiratory presentation during/post trials. Oral phase appeared [facility] w/ timely bolus management, mastication, and control of bolus propulsion for A-P transfer for swallowing. Oral clearing achieved w/ all trial consistencies. OM Exam appeared [facility] w/ no unilateral weakness noted. Speech Clear. Pt fed self w/ setup support.   Recommend a Mech Soft consistency diet but w/ MINCED meats w/ gravies, moistened foods; Thin liquids via Cup -- NO Straws. Recommend general aspiration precautions, Pills CRUSHED in Puree for safer, easier clearing of the Esophagus. STRICT REFLUX PRECAUTIONS.   Education given on Pills in Puree; food consistencies and easy to eat options; general aspiration and Reflux  precautions; monitoring and/or reducing Bulky foods in diet -- moistening foods well and adding gravy/condiments. Rest breaks during meals and frequent, mini meals. Recommend Dietician f/u for support and education also. NSG to reconsult if any new needs arise. NSG agreed. MD updated post eval -- recommendation given for PALLIATIVE CARE consult for [place] as pt Discharges back to his NH. SLP Visit Diagnosis: Dysphagia, pharyngoesophageal phase (R13.14) (Esophageal phase dysmotility/dysphagia -- "corkscrew" esophagus w/ dysmotility and poor peristalsis  per DG Esophagus)    Aspiration Risk  Mild aspiration risk;Risk for inadequate nutrition/hydration (d/t Esophageal Backflow and Regurgitation)    Diet Recommendation   Mech Soft consistency diet but w/ MINCED meats w/ gravies, moistened foods; Thin liquids via Cup -- NO Straws. Recommend general aspiration precautions, rest breaks during meals to allow for Esophageal clearing. STRICT REFLUX PRECAUTIONS.   Medication Administration: Crushed with puree (for Esophageal clearing)    Other  Recommendations Recommended Consults: Consider GI evaluation;Consider esophageal assessment (as per Radiology report too; Palliative Care f/u for GOC/support at [facility]) Oral Care Recommendations: Oral care BID;Oral care before and after PO;Staff/trained caregiver to provide oral care (support pt) Other Recommendations:  (n/a)    Recommendations for follow up therapy are one component of a multi-disciplinary discharge planning process, led by the attending physician.  Recommendations may be updated based on patient status, additional functional criteria and insurance authorization.  Follow up Recommendations No SLP follow up - pt appears at his baseline w/ the nature of his Esophageal phase Dysmotility/Deficits. Needs f/u support from Dietician and Palliative Care(GOC).     Assistance Recommended at Discharge Intermittent Supervision/Assistance (unsure of insight/awareness of deficits)  Functional Status Assessment Patient has had a recent decline in their functional status and/or demonstrates limited ability to make significant improvements in function in a reasonable and predictable amount of time  Frequency and Duration  (n/a)   (n/a)       Prognosis Prognosis for Safe Diet Advancement: Fair Barriers to Reach Goals: Cognitive deficits;Time post onset;Severity of deficits;Behavior Barriers/Prognosis Comment: unsure of insight/awareness of deficits      Swallow Study   General Date of Onset: 12/09/21 HPI:  Pt is a 76 y.o. male with medical history significant for coronary artery disease status post CABG, history of multiple myeloma, diabetes mellitus with stage III chronic kidney disease, hypertension, anxiety and depression who presents to the ER for evaluation of worsening shortness of breath from his baseline and weakness.  Shortness of breath is mostly with exertion and is occasionally associated with a cough productive of clear to green phlegm.    Chest x-ray reviewed by MD shows increased additional groundglass opacities throughout both lungs suggesting possible mild edema.  Underlying chronic pleural-parenchymal scarring.  OF NOTE: in 11/2020: He endorsed having difficulty swallowing "certain solid foods"(meats/breads); he denied any difficulty swallowing water at home(but stated he drinks more "Sodas"). He was seen by ST services then who recommended f/u w/ GI -- no Imaging noted per chart.  At this admit, ST services recommended to MD for a DG Esophagus.  A DG Esophagus was performed on 12/11/2021 revealing: "Markedly patulous esophagus above the GE junction with extensive  tertiary contractions throughout the exam. "Corkscrewing" of the  esophagus as well was noted with limited peristaltic activity  distally though there was emptying of the esophagus despite  segmental narrowing of the distal esophagus into the stomach.  Findings could relate to distal esophageal stricture but could not  be well assessed due to the debilitated nature of the  patient.  Marked dysmotility and the long segment narrowing with beaked  appearance raising the question achalasia. Esophageal manometry and  endoscopy may be helpful to narrow the differential.:.  Unsure of pt'f Full insight/awareness of his Esophageal phase deficits and impact on swallowing including risk for aspiration of REFLUX material. Type of Study: Bedside Swallow Evaluation Previous Swallow Assessment: 11/2020 Diet Prior to this Study: NPO (s/p DG Esophagus  yesterday) Temperature Spikes Noted: No (wbc 6.7) Respiratory Status: Nasal cannula (3L) History of Recent Intubation: No Behavior/Cognition: Alert;Cooperative;Pleasant mood;Distractible;Requires cueing (unsure of full Cognitive status and insight into deficits) Oral Cavity Assessment: Within Functional Limits Oral Care Completed by SLP: Yes Oral Cavity - Dentition: Adequate natural dentition Vision: Functional for self-feeding Self-Feeding Abilities: Able to feed self;Needs assist;Needs set up (positioning) Patient Positioning: Upright in bed (positioning assist) Baseline Vocal Quality: Normal (though intermittently wet (saliva)) Volitional Cough: Strong;Congested (min) Volitional Swallow: Able to elicit    Oral/Motor/Sensory Function Overall Oral Motor/Sensory Function: Within functional limits   Ice Chips Ice chips: Within functional limits Presentation: Spoon (fed; 3 trials)   Thin Liquid Thin Liquid: Within functional limits Presentation: Cup;Self Fed (10+ trials) Other Comments: some multiple -- mild throat clearing noted x1    Nectar Thick Nectar Thick Liquid: Not tested   Honey Thick Honey Thick Liquid: Not tested   Puree Puree: Within functional limits Presentation: Self Fed;Spoon (10 trials)   Solid     Solid: Within functional limits (broken small, moistened) Presentation: Self Fed;Spoon (5 trials)        Orinda Kenner, MS, CCC-SLP Speech Language Pathologist Rehab Services; [place] [phone] (ascom) Leroi Haque 12/12/2021,4:58 PM

## 2021-12-12 NOTE — Progress Notes (Signed)
*  PRELIMINARY RESULTS* Echocardiogram 2D Echocardiogram has been performed.  Claretta Fraise 12/12/2021, 12:45 PM

## 2021-12-12 NOTE — Progress Notes (Signed)
Patient was taken to get MRI

## 2021-12-12 NOTE — Progress Notes (Signed)
PROGRESS NOTE    Luis [name].  SVX:[id number] DOB: 11-Mar-1946 DOA: 12/09/2021 PCP: Tracie Harrier, MD  Outpatient Specialists: cardiology, dermatology, oncology    Brief Narrative:   From admission h and p Luis [name]. is a 76 y.o. male with medical history significant for coronary artery disease status post CABG, history of multiple myeloma, diabetes mellitus with stage III chronic kidney disease, hypertension, anxiety and depression who presents to the ER for evaluation of worsening shortness of breath from his baseline and weakness. Shortness of breath is mostly with exertion and is occasionally associated with a cough productive of clear to green phlegm.  He denies having any fever but has had chills. Per EMS patient's room air pulse oximetry was 90 to 91% and his blood pressure was in the 80s. He has bilateral lower extremity swelling and has noted increased redness involving his lower extremities. He denies having any headache, no dizziness, no lightheadedness, no abdominal pain, no nausea, no changes in his bowel habits, no urinary symptoms, no chest pain, no blood pressure no focal deficit. Sodium 135, potassium 2.3, chloride 97, bicarb 27, glucose 106, BUN 43, creatinine 2.08, calcium 7.6, alkaline phosphatase 63, albumin 2.1, AST 41, ALT 16, total protein 5.8, troponin 38, lactic acid 5.1 >> 4.3, PT 17.7, INR 1.5 Respiratory viral panel is negative Urine analysis is stable MRSA PCR was positive Chest x-ray reviewed by me shows increased additional groundglass opacities throughout both lungs suggesting possible mild edema.  Underlying chronic pleural-parenchymal scarring. Twelve-lead EKG shows atrial flutter with 2-1 block, PVCs and a right bundle branch block.   Assessment & Plan:   Principal Problem:   Sepsis ([place]) Active Problems:   Spinal stenosis   Type 2 diabetes mellitus without complication, without long-term current use of insulin (HCC)   CAD (coronary  artery disease) of artery bypass graft   Depression with anxiety   Atrial fibrillation with RVR (HCC)   Cellulitis   Hypokalemia   Malnutrition of moderate degree   # lower extremity cellulitis # Sepsis # Bacteremia Sepsis by tachycardia, lactic acidosis. S/p 1 liter in the ED. 1/4 BCs growing s aureus. ID following. Lower extremity erythema improving. Culture pan-sensitive. Repeat blood cultures ngtd. MRI of lumbar spine showed no signs infection. - cont cefazolin - f/u TTE, may need TEE  # Acute hypoxic respiratory failure Likely 2/2 sepsis on CHF, debility, a fib with rvr. Possible aspiration. Rales and wheeze on exam today - resume lasix, 40 iv qd - f/u CT  - cont o2  # Acute kidney injury Cr 2.16 from baseline around 1.2. treated w/ ivf, cr stable around 2. Renal u/s w/o hydro - pause fluids - f/u tte, renal u/s  #  Dysphagia Esophagram on 2/3 shows "corkscrewing" of the esophagus, possible stricture, marked dysmotility. - SLP to eval swallowing - will need close GI f/u at d/c  # Chronic diarrhea Established w/ GI last month - check gi pathogen panel  # Ascites Incidentally seen on renal u/s - abd u/s ascites scan  # Hypokalemia # Hypomagenesemia Resolved - monitor  # dCHF # CAD s/p bypass graft No signs/symptoms ACS. Bnp markedly elevated on presentation. Patient reports lower extremity swelling is mild and improved from baseline so initially treated with fluids - f/u tte - resuming diuresis today  # A fib with rvr RVR resolved. Not angicoagulated 2/2 frequent falls - cont home propranolol  # HTN Here bp wnl - hold home losartan, cont propranolol   #  Multiple myeloma - followed by onc, not currently treated  # T7 compression fracture 2/2 MM.  - pain control     DVT prophylaxis: heparin Code Status: DNR, confirmed w/ son and patient 2/3 Family Communication: son updated @ bedside 2/4  Level of care: Progressive Status is: Inpatient Remains  inpatient appropriate because: severity of illness    Consultants:  ID  Procedures: none  Antimicrobials:  Vanc/cefepime>cefazolin    Subjective: Says breathing and anxiety improved, no pain  Objective: Vitals:   12/12/21 0432 12/12/21 0500 12/12/21 0718 12/12/21 1100  BP: 98/63  (!) 118/95 110/88  Pulse: 79  69   Resp: '16  17 19  ' Temp: 97.6 F (36.4 C)  97.9 F (36.6 C) 97.6 F (36.4 C)  TempSrc: Oral   Oral  SpO2: 98%  95% 96%  Weight:  82.3 kg      Intake/Output Summary (Last 24 hours) at 12/12/2021 1324 Last data filed at 12/12/2021 1250 Gross per 24 hour  Intake 920.44 ml  Output 400 ml  Net 520.44 ml   Filed Weights   12/09/21 1050 12/11/21 0413 12/12/21 0500  Weight: 83.5 kg 82.5 kg 82.3 kg    Examination:  General exam: Appears calm and comfortable  Respiratory system: rales, exp wheeze Cardiovascular system: S1 & S2 heard, irreg irreg, soft systolic murmur No JVD, murmurs, rubs, gallops or clicks.  Gastrointestinal system: Abdomen is nondistended, soft and nontender. No organomegaly or masses felt. Normal bowel sounds heard. Central nervous system: Alert and oriented. No focal neurological deficits. Extremities: Symmetric 5 x 5 power. Skin: inerval mild impreovement in lower extremity erythema. Scabbs present. Ulcers on scalp bandaged Psychiatry: Judgement and insight appear normal. Mood & affect appropriate.     Data Reviewed: I have personally reviewed following labs and imaging studies  CBC: Recent Labs  Lab 12/09/21 1056 12/10/21 0621 12/11/21 0950 12/12/21 0414  WBC 10.8* 8.1 8.9 6.7  NEUTROABS 9.3*  --   --   --   HGB 11.5* 9.8* 11.4* 10.8*  HCT 33.1* 27.9* 33.2* 32.2*  MCV 110.0* 109.0* 109.2* 111.0*  PLT 149* 129* 156 412   Basic Metabolic Panel: Recent Labs  Lab 12/09/21 1305 12/10/21 0621 12/10/21 2125 12/11/21 0950 12/12/21 0414  NA 135 132*  --  131* 133*  K 2.3* 2.9*  --  4.4 4.3  CL 97* 96*  --  99 101  CO2 27 27   --  23 23  GLUCOSE 106* 117*  --  124* 101*  BUN 43* 51*  --  50* 45*  CREATININE 2.08* 2.16*  --  2.07* 1.97*  CALCIUM 7.6* 7.4*  --  7.3* 7.3*  MG 1.4* 2.5* 2.3 2.3 2.2   GFR: Estimated Creatinine Clearance: 32.2 mL/min (A) (by C-G formula based on SCr of 1.97 mg/dL (H)). Liver Function Tests: Recent Labs  Lab 12/09/21 1305  AST 41  ALT 16  ALKPHOS 63  BILITOT 1.8*  PROT 5.8*  ALBUMIN 2.1*   No results for input(s): LIPASE, AMYLASE in the last 168 hours. No results for input(s): AMMONIA in the last 168 hours. Coagulation Profile: Recent Labs  Lab 12/09/21 1208 12/10/21 0621  INR 1.5* 1.5*   Cardiac Enzymes: No results for input(s): CKTOTAL, CKMB, CKMBINDEX, TROPONINI in the last 168 hours. BNP (last 3 results) No results for input(s): PROBNP in the last 8760 hours. HbA1C: No results for input(s): HGBA1C in the last 72 hours.  CBG: Recent Labs  Lab 12/11/21 1123 12/11/21 1752  12/11/21 2049 12/12/21 0721 12/12/21 1151  GLUCAP 126* 102* 119* 129* 120*   Lipid Profile: No results for input(s): CHOL, HDL, LDLCALC, TRIG, CHOLHDL, LDLDIRECT in the last 72 hours. Thyroid Function Tests: No results for input(s): TSH, T4TOTAL, FREET4, T3FREE, THYROIDAB in the last 72 hours. Anemia Panel: No results for input(s): VITAMINB12, FOLATE, FERRITIN, TIBC, IRON, RETICCTPCT in the last 72 hours. Urine analysis:    Component Value Date/Time   COLORURINE YELLOW 12/09/2021 1119   APPEARANCEUR CLEAR (A) 12/09/2021 1119   APPEARANCEUR Clear 08/07/2013 0102   LABSPEC 1.015 12/09/2021 1119   LABSPEC 1.023 08/07/2013 0102   PHURINE 5.0 12/09/2021 1119   GLUCOSEU NEGATIVE 12/09/2021 1119   GLUCOSEU 50 mg/dL 08/07/2013 0102   HGBUR NEGATIVE 12/09/2021 1119   BILIRUBINUR NEGATIVE 12/09/2021 1119   BILIRUBINUR Negative 08/07/2013 0102   KETONESUR TRACE (A) 12/09/2021 1119   PROTEINUR TRACE (A) 12/09/2021 1119   NITRITE NEGATIVE 12/09/2021 1119   LEUKOCYTESUR NEGATIVE 12/09/2021  1119   LEUKOCYTESUR Negative 08/07/2013 0102   Sepsis Labs: '@LABRCNTIP' (procalcitonin:4,lacticidven:4)  ) Recent Results (from the past 240 hour(s))  Blood Culture (routine x 2)     Status: Abnormal   Collection Time: 12/09/21 10:57 AM   Specimen: BLOOD  Result Value Ref Range Status   Specimen Description   Final    BLOOD LEFT WRIST Performed at [facility], [address], [place], [place] [address]    Special Requests   Final    BOTTLES DRAWN AEROBIC AND ANAEROBIC Blood Culture adequate volume Performed at [facility], [address] [place], [place] [address]    Culture  Setup Time   Final    GRAM POSITIVE COCCI AEROBIC BOTTLE ONLY CRITICAL RESULT CALLED TO, READ BACK BY AND VERIFIED WITH: JASON ROBBINS @ 0149 ON 12/10/21.Marland KitchenMarland KitchenTKR Performed at [facility], [place] [address], [place], [place] [address]    Culture STAPHYLOCOCCUS AUREUS (A)  Final   Report Status 12/12/2021 FINAL  Final   Organism ID, Bacteria STAPHYLOCOCCUS AUREUS  Final      Susceptibility   Staphylococcus aureus - MIC*    CIPROFLOXACIN <=0.5 SENSITIVE Sensitive     ERYTHROMYCIN <=0.25 SENSITIVE Sensitive     GENTAMICIN <=0.5 SENSITIVE Sensitive     OXACILLIN <=0.25 SENSITIVE Sensitive     TETRACYCLINE <=1 SENSITIVE Sensitive     VANCOMYCIN <=0.5 SENSITIVE Sensitive     TRIMETH/SULFA <=10 SENSITIVE Sensitive     CLINDAMYCIN <=0.25 SENSITIVE Sensitive     RIFAMPIN <=0.5 SENSITIVE Sensitive     Inducible Clindamycin NEGATIVE Sensitive     * STAPHYLOCOCCUS AUREUS  Blood Culture ID Panel (Reflexed)     Status: Abnormal   Collection Time: 12/09/21 10:57 AM  Result Value Ref Range Status   Enterococcus faecalis NOT DETECTED NOT DETECTED Final   Enterococcus Faecium NOT DETECTED NOT DETECTED Final   Listeria monocytogenes NOT DETECTED NOT DETECTED Final   Staphylococcus species DETECTED (A) NOT DETECTED Final    Comment: CRITICAL RESULT CALLED TO, READ BACK BY AND VERIFIED  WITH: JASON ROBBINS @ 0149 ON 12/10/21.Marland KitchenMarland KitchenTKR    Staphylococcus aureus (BCID) DETECTED (A) NOT DETECTED Final    Comment: CRITICAL RESULT CALLED TO, READ BACK BY AND VERIFIED WITH: JASON ROBBINS @ 0149 ON 12/10/21.Marland KitchenMarland KitchenTKR    Staphylococcus epidermidis NOT DETECTED NOT DETECTED Final   Staphylococcus lugdunensis NOT DETECTED NOT DETECTED Final   Streptococcus species NOT DETECTED NOT DETECTED Final   Streptococcus agalactiae NOT DETECTED NOT DETECTED Final   Streptococcus  pneumoniae NOT DETECTED NOT DETECTED Final   Streptococcus pyogenes NOT DETECTED NOT DETECTED Final   A.calcoaceticus-baumannii NOT DETECTED NOT DETECTED Final   Bacteroides fragilis NOT DETECTED NOT DETECTED Final   Enterobacterales NOT DETECTED NOT DETECTED Final   Enterobacter cloacae complex NOT DETECTED NOT DETECTED Final   Escherichia coli NOT DETECTED NOT DETECTED Final   Klebsiella aerogenes NOT DETECTED NOT DETECTED Final   Klebsiella oxytoca NOT DETECTED NOT DETECTED Final   Klebsiella pneumoniae NOT DETECTED NOT DETECTED Final   Proteus species NOT DETECTED NOT DETECTED Final   Salmonella species NOT DETECTED NOT DETECTED Final   Serratia marcescens NOT DETECTED NOT DETECTED Final   Haemophilus influenzae NOT DETECTED NOT DETECTED Final   Neisseria meningitidis NOT DETECTED NOT DETECTED Final   Pseudomonas aeruginosa NOT DETECTED NOT DETECTED Final   Stenotrophomonas maltophilia NOT DETECTED NOT DETECTED Final   Candida albicans NOT DETECTED NOT DETECTED Final   Candida auris NOT DETECTED NOT DETECTED Final   Candida glabrata NOT DETECTED NOT DETECTED Final   Candida krusei NOT DETECTED NOT DETECTED Final   Candida parapsilosis NOT DETECTED NOT DETECTED Final   Candida tropicalis NOT DETECTED NOT DETECTED Final   Cryptococcus neoformans/gattii NOT DETECTED NOT DETECTED Final   Meth resistant mecA/C and MREJ NOT DETECTED NOT DETECTED Final    Comment: Performed at [facility], [address]  [place], [place] [address]  Blood Culture (routine x 2)     Status: None (Preliminary result)   Collection Time: 12/09/21 10:59 AM   Specimen: BLOOD  Result Value Ref Range Status   Specimen Description BLOOD RIGHT [facility]  Final   Special Requests   Final    BOTTLES DRAWN AEROBIC AND ANAEROBIC Blood Culture adequate volume   Culture   Final    NO GROWTH 3 DAYS Performed at [facility], [address] [place], [place] [address]    Report Status PENDING  Incomplete  Urine Culture     Status: Abnormal   Collection Time: 12/09/21 11:19 AM   Specimen: In/Out Cath Urine  Result Value Ref Range Status   Specimen Description   Final    IN/OUT CATH URINE Performed at [facility], [address] [place], [place] [address]    Special Requests   Final    NONE Performed at [facility], [place], [place] [address]    Culture 1,000 COLONIES/mL ENTEROCOCCUS FAECALIS (A)  Final   Report Status 12/12/2021 FINAL  Final   Organism ID, Bacteria ENTEROCOCCUS FAECALIS (A)  Final      Susceptibility   Enterococcus faecalis - MIC*    AMPICILLIN <=2 SENSITIVE Sensitive     NITROFURANTOIN <=16 SENSITIVE Sensitive     VANCOMYCIN 1 SENSITIVE Sensitive     * 1,000 COLONIES/mL ENTEROCOCCUS FAECALIS  Resp Panel by RT-PCR (Flu A&B, Covid) Nasopharyngeal Swab     Status: None   Collection Time: 12/09/21 11:19 AM   Specimen: Nasopharyngeal Swab; Nasopharyngeal(NP) swabs in vial transport medium  Result Value Ref Range Status   SARS Coronavirus 2 by RT PCR NEGATIVE NEGATIVE Final    Comment: (NOTE) SARS-CoV-2 target nucleic acids are NOT DETECTED.  The SARS-CoV-2 RNA is generally detectable in upper respiratory specimens during the acute phase of infection. The lowest concentration of SARS-CoV-2 viral copies this assay can detect is 138 copies/mL. A negative result does not preclude SARS-Cov-2 infection and should not be used as the sole basis for treatment or other  patient  management decisions. A negative result may occur with  improper specimen collection/handling, submission of specimen other than nasopharyngeal swab, presence of viral mutation(s) within the areas targeted by this assay, and inadequate number of viral copies(<138 copies/mL). A negative result must be combined with clinical observations, patient history, and epidemiological information. The expected result is Negative.  Fact Sheet for Patients:  EntrepreneurPulse.com.au  Fact Sheet for Healthcare Providers:  IncredibleEmployment.be  This test is no t yet approved or cleared by the [place] FDA and  has been authorized for detection and/or diagnosis of SARS-CoV-2 by FDA under an Emergency Use Authorization (EUA). This EUA will remain  in effect (meaning this test can be used) for the duration of the COVID-19 declaration under Section 564(b)(1) of the Act, 21 U.S.C.section 360bbb-3(b)(1), unless the authorization is terminated  or revoked sooner.       Influenza A by PCR NEGATIVE NEGATIVE Final   Influenza B by PCR NEGATIVE NEGATIVE Final    Comment: (NOTE) The Xpert Xpress SARS-CoV-2/FLU/RSV plus assay is intended as an aid in the diagnosis of influenza from Nasopharyngeal swab specimens and should not be used as a sole basis for treatment. Nasal washings and aspirates are unacceptable for Xpert Xpress SARS-CoV-2/FLU/RSV testing.  Fact Sheet for Patients: EntrepreneurPulse.com.au  Fact Sheet for Healthcare Providers: IncredibleEmployment.be  This test is not yet approved or cleared by the [place] FDA and has been authorized for detection and/or diagnosis of SARS-CoV-2 by FDA under an Emergency Use Authorization (EUA). This EUA will remain in effect (meaning this test can be used) for the duration of the COVID-19 declaration under Section 564(b)(1) of the Act, 21 U.S.C. section  360bbb-3(b)(1), unless the authorization is terminated or revoked.  Performed at [facility], [address] [place], [place] [address]   MRSA Next Gen by PCR, Nasal     Status: Abnormal   Collection Time: 12/09/21  1:24 PM   Specimen: Nasal Mucosa; Nasal Swab  Result Value Ref Range Status   MRSA by PCR Next Gen DETECTED (A) NOT DETECTED Final    Comment: RESULT CALLED TO, READ BACK BY AND VERIFIED WITH: JON COOK 1525 12/09/21 MU (NOTE) The GeneXpert MRSA Assay (FDA approved for NASAL specimens only), is one component of a comprehensive MRSA colonization surveillance program. It is not intended to diagnose MRSA infection nor to guide or monitor treatment for MRSA infections. Test performance is not FDA approved in patients less than 23 years old. Performed at [facility], [address] [place], [place] [address]   Aerobic Culture w Gram Stain (superficial specimen)     Status: None (Preliminary result)   Collection Time: 12/10/21  5:31 PM   Specimen: SCALP; Wound  Result Value Ref Range Status   Specimen Description   Final    SCALP Performed at [facility], [address] [place], [place] [address]    Special Requests   Final    NONE Performed at [facility], [address] [place], [place] [address]    Gram Stain   Final    FEW WBC PRESENT, PREDOMINANTLY MONONUCLEAR MODERATE GRAM POSITIVE COCCI    Culture   Final    ABUNDANT STAPHYLOCOCCUS AUREUS SUSCEPTIBILITIES TO FOLLOW Performed at [facility], [place] [address] [place],  [address]    Report Status PENDING  Incomplete  CULTURE, BLOOD (ROUTINE X 2) w Reflex to ID Panel     Status: None (Preliminary result)   Collection Time: 12/11/21  6:59 AM   Specimen: BLOOD  Result Value Ref Range Status   Specimen Description BLOOD LEFT WRIST  Final   Special Requests   Final    BOTTLES DRAWN AEROBIC AND ANAEROBIC Blood Culture adequate volume   Culture   Final     NO GROWTH < 24 HOURS Performed at [facility], [address] [place], [place] [address]    Report Status PENDING  Incomplete  CULTURE, BLOOD (ROUTINE X 2) w Reflex to ID Panel     Status: None (Preliminary result)   Collection Time: 12/11/21  7:16 AM   Specimen: BLOOD  Result Value Ref Range Status   Specimen Description BLOOD LEFT ARM  Final   Special Requests   Final    BOTTLES DRAWN AEROBIC AND ANAEROBIC Blood Culture adequate volume   Culture   Final    NO GROWTH < 24 HOURS Performed at [facility], [address] [place], [place] [address]    Report Status PENDING  Incomplete         Radiology Studies: MR LUMBAR SPINE WO CONTRAST  Result Date: 12/12/2021 CLINICAL DATA:  History of epidural steroid injections in lumbar spine, bacteremia, concern for osteomyelitis. EXAM: MRI LUMBAR SPINE WITHOUT CONTRAST TECHNIQUE: Multiplanar, multisequence MR imaging of the lumbar spine was performed. No intravenous contrast was administered. COMPARISON:  04/06/2019 FINDINGS: Segmentation:  Standard. Alignment:  Levocurvature of the lumbar spine. Vertebrae: No acute fracture or suspicious osseous lesion. No evidence of discitis or osteomyelitis. Unchanged degenerative changes at the anterior superior endplate of L5. Vertebral body endplates are otherwise intact.Redemonstrated cystic lesion in the left iliac bone, which appears unchanged compared to 2020. Conus medullaris and cauda equina: Conus extends to the T12 level. Conus and cauda equina appear normal. No epidural collection. Paraspinal and other soft tissues: Negative. Disc levels: L1-L2: No significant disc bulge. No spinal canal stenosis or neural foraminal narrowing. L2-L3: Mild disc bulge. Mild facet arthropathy. Narrowing of the lateral recesses. No spinal canal stenosis or neural foraminal narrowing. L3-L4: Mild disc bulge. Moderate right and mild left facet arthropathy. Narrowing of the lateral recesses. No spinal  canal stenosis or neural foraminal narrowing. L4-L5: Mild disc bulge. Severe facet arthropathy. Effacement of the lateral recesses. Moderate spinal canal stenosis, which appears similar to the prior exam. Unchanged moderate bilateral neural foraminal narrowing. L5-S1: No significant disc bulge. Moderate right and mild left facet arthropathy. No spinal canal stenosis or neural foraminal narrowing. IMPRESSION: 1. No acute osseous abnormality. No evidence of discitis osteomyelitis. 2. L4-L5 moderate spinal canal stenosis and moderate bilateral neural foraminal narrowing, unchanged. Effacement of the lateral recesses at this level likely compresses the descending L5 nerve roots. 3. Narrowing of the lateral recesses at L2-L3 and L3-L4 could affect the descending L3 and L4 nerve roots, respectively. Electronically Signed   By: Merilyn Baba M.D.   On: 12/12/2021 03:16   US RENAL  Result Date: 12/11/2021 CLINICAL DATA:  Acute kidney injury EXAM: RENAL / URINARY TRACT ULTRASOUND COMPLETE COMPARISON:  None. FINDINGS: Right Kidney: Renal measurements: 10.4 x 4.6 x 5.4 cm = volume: 135.2 mL. Echogenicity within normal limits. No mass or hydronephrosis visualized. Left Kidney: Renal measurements: 9.4 x 5 x 4 cm = volume: 99.1 mL. Echogenicity within normal limits. No mass or hydronephrosis visualized. Bladder: Appears normal for degree of bladder distention. Suboptimal evaluation due to bandage per technologist. Other: Ascites. IMPRESSION: No hydronephrosis. Ascites. Electronically Signed   By: Macy Mis M.D.   On: 12/11/2021  16:54   DG Chest Port 1 View  Result Date: 12/11/2021 CLINICAL DATA:  Aspiration pneumonia EXAM: PORTABLE CHEST 1 VIEW COMPARISON:  CT chest 12/09/2021 FINDINGS: Bilateral patchy interstitial and alveolar airspace opacities. No pleural effusion or pneumothorax. Stable cardiomegaly. Prior CABG. No acute osseous abnormality. IMPRESSION: 1. Bilateral patchy interstitial and alveolar airspace  opacities concerning for multilobar pneumonia versus pulmonary edema. Electronically Signed   By: Kathreen Devoid M.D.   On: 12/11/2021 18:55   DG Chest Port 1 View  Result Date: 12/11/2021 CLINICAL DATA:  Shortness of breath EXAM: PORTABLE CHEST 1 VIEW COMPARISON:  Previous studies including the examination of 12/09/2021 FINDINGS: Transverse diameter of heart is increased. Central pulmonary vessels are prominent. Breathing motion limits evaluation of lung fields. There are no signs of alveolar pulmonary edema. There is asymmetric pleural density in the lateral aspect of left apex with no significant change. There is some crowding of markings in the lower lung fields. No new focal pulmonary consolidation is seen. There is blunting of lateral CP angles, more so on the left side. There is no pneumothorax. Multiple old fractures are seen in the left ribs. There is evidence of previous coronary bypass surgery. IMPRESSION: Cardiomegaly. There are no signs of alveolar pulmonary edema or new focal pulmonary consolidation. Small bilateral pleural effusions, more so on the left side. Electronically Signed   By: Elmer Picker M.D.   On: 12/11/2021 15:27   DG ESOPHAGUS W DOUBLE CM (HD)  Result Date: 12/11/2021 CLINICAL DATA:  A 76 year old male presents for evaluation of swallowing and dysphagia. EXAM: ESOPHOGRAM/BARIUM SWALLOW TECHNIQUE: Single contrast examination was performed using  thin barium. FLUOROSCOPY: Fluoroscopy time: 2 minutes 48 seconds Fluoroscopy dose: 50.7 mGy COMPARISON:  Chest CT from December 09, 2021. FINDINGS: The exam was markedly limited due to the patient's debilitated state and difficulty in positioning the patient. Exam was performed in slightly LPO just off AP and RPO positioning. The esophagus was found to be markedly patulous above the GE junction where there was tortuosity and extensive tertiary contractions throughout the exam. Long segment narrowing of the GE junction was noted on  multiple images though this could not be challenged with a large bolus and the presence of dysmotility also limited assessment of this area. This appears to be smooth and long segment. Along the distal medial esophagus there are small pulsion diverticula with distortion of the medial esophageal wall. Contrast did pass into the stomach aided by the administration of some water. Additional images could not be obtained due to coughing that the patient experienced though there was no visible "gross" aspiration.There may be some laryngeal penetration though this area was difficult to evaluate. In the RPO position marked "corkscrewing" and tortuosity of the proximal esophagus was noted with the patient in kyphotic position at baseline. IMPRESSION: 1. Markedly patulous esophagus above the GE junction with extensive tertiary contractions throughout the exam. "Corkscrewing" of the esophagus as well was noted with limited peristaltic activity distally though there was emptying of the esophagus despite segmental narrowing of the distal esophagus into the stomach. Findings could relate to distal esophageal stricture but could not be well assessed due to the debilitated nature of the patient. Marked dysmotility and the long segment narrowing with beaked appearance raising the question achalasia. Esophageal manometry and endoscopy may be helpful to narrow the differential. 2. Given difficulty with swallowing a dedicated swallow function may also be helpful. Electronically Signed   By: Zetta Bills M.D.   On: 12/11/2021 16:04  Scheduled Meds:  ascorbic acid  500 mg Oral Daily   aspirin  81 mg Oral Daily   calcium-vitamin D  1 tablet Oral BID   Chlorhexidine Gluconate Cloth  6 each Topical Q0600   heparin  5,000 Units Subcutaneous Q8H   insulin aspart  0-15 Units Subcutaneous TID WC   lidocaine  1 patch Transdermal Q24H   loratadine  10 mg Oral Daily   multivitamin with minerals  1 tablet Oral Daily    mupirocin ointment  1 application Nasal BID   pantoprazole  40 mg Oral Daily   PARoxetine  10 mg Oral QHS   pravastatin  40 mg Oral q1800   propranolol  10 mg Oral BID   Vitamin D (Ergocalciferol)  50,000 Units Oral Weekly   Continuous Infusions:  sodium chloride 5 mL/hr at 12/12/21 1250    ceFAZolin (ANCEF) IV Stopped (12/12/21 1151)     LOS: 3 days    Time spent: 42 min    Desma Maxim, MD [facility]   If 7PM-7AM, please contact night-coverage [email or web address] Password [email or web address] 12/12/2021, 1:24 PM

## 2021-12-12 NOTE — Progress Notes (Signed)
Patient is back from MRI, no changes noted

## 2021-12-13 DIAGNOSIS — A419 Sepsis, unspecified organism: Secondary | ICD-10-CM | POA: Diagnosis not present

## 2021-12-13 LAB — BASIC METABOLIC PANEL
Anion gap: 7 (ref 5–15)
BUN: 45 mg/dL — ABNORMAL HIGH (ref 8–23)
CO2: 23 mmol/L (ref 22–32)
Calcium: 7.4 mg/dL — ABNORMAL LOW (ref 8.9–10.3)
Chloride: 103 mmol/L (ref 98–111)
Creatinine, Ser: 2.12 mg/dL — ABNORMAL HIGH (ref 0.61–1.24)
GFR, Estimated: 31 mL/min — ABNORMAL LOW (ref >60)
Glucose, Bld: 115 mg/dL — ABNORMAL HIGH (ref 70–99)
Potassium: 3.5 mmol/L (ref 3.5–5.1)
Sodium: 133 mmol/L — ABNORMAL LOW (ref 135–145)

## 2021-12-13 LAB — CBC
HCT: 29.9 % — ABNORMAL LOW (ref 39.0–52.0)
Hemoglobin: 10.3 g/dL — ABNORMAL LOW (ref 13.0–17.0)
MCH: 37.5 pg — ABNORMAL HIGH (ref 26.0–34.0)
MCHC: 34.4 g/dL (ref 30.0–36.0)
MCV: 108.7 fL — ABNORMAL HIGH (ref 80.0–100.0)
Platelets: 157 10*3/uL (ref 150–400)
RBC: 2.75 MIL/uL — ABNORMAL LOW (ref 4.22–5.81)
RDW: 17.2 % — ABNORMAL HIGH (ref 11.5–15.5)
WBC: 5.8 10*3/uL (ref 4.0–10.5)
nRBC: 0 % (ref 0.0–0.2)

## 2021-12-13 LAB — GASTROINTESTINAL PANEL BY PCR, STOOL (REPLACES STOOL CULTURE)

## 2021-12-13 LAB — GLUCOSE, CAPILLARY
Glucose-Capillary: 94 mg/dL (ref 70–99)
Glucose-Capillary: 115 mg/dL — ABNORMAL HIGH (ref 70–99)
Glucose-Capillary: 125 mg/dL — ABNORMAL HIGH (ref 70–99)
Glucose-Capillary: 113 mg/dL — ABNORMAL HIGH (ref 70–99)
Glucose-Capillary: 80 mg/dL (ref 70–99)

## 2021-12-13 MED ORDER — SPIRONOLACTONE 25 MG PO TABS
50.0000 mg | ORAL_TABLET | Freq: Every day | ORAL | Status: DC
  Administered 2021-12-13 – 2021-12-14 (×2): 50 mg via ORAL
  Filled 2021-12-13 (×2): qty 2

## 2021-12-13 MED ORDER — FUROSEMIDE 10 MG/ML IJ SOLN
40.0000 mg | Freq: Every day | INTRAMUSCULAR | Status: DC
  Administered 2021-12-13 – 2021-12-14 (×2): 40 mg via INTRAVENOUS
  Filled 2021-12-13 (×2): qty 4

## 2021-12-13 MED ORDER — MIDODRINE HCL 5 MG PO TABS
2.5000 mg | ORAL_TABLET | Freq: Three times a day (TID) | ORAL | Status: DC
  Administered 2021-12-13 – 2021-12-18 (×15): 2.5 mg via ORAL
  Filled 2021-12-13 (×15): qty 1

## 2021-12-13 MED ORDER — METRONIDAZOLE 500 MG/100ML IV SOLN
500.0000 mg | Freq: Two times a day (BID) | INTRAVENOUS | Status: DC
  Administered 2021-12-13 – 2021-12-17 (×8): 500 mg via INTRAVENOUS
  Filled 2021-12-13 (×10): qty 100

## 2021-12-13 NOTE — Progress Notes (Addendum)
PROGRESS NOTE    Luis [name].  ULA:[id number] DOB: 09-May-1946 DOA: 12/09/2021 PCP: Tracie Harrier, MD  Outpatient Specialists: cardiology, dermatology, oncology    Brief Narrative:   From admission h and p Luis [name]. is a 76 y.o. male with medical history significant for coronary artery disease status post CABG, history of multiple myeloma, diabetes mellitus with stage III chronic kidney disease, hypertension, anxiety and depression who presents to the ER for evaluation of worsening shortness of breath from his baseline and weakness. Shortness of breath is mostly with exertion and is occasionally associated with a cough productive of clear to green phlegm.  He denies having any fever but has had chills. Per EMS patient's room air pulse oximetry was 90 to 91% and his blood pressure was in the 80s. He has bilateral lower extremity swelling and has noted increased redness involving his lower extremities. He denies having any headache, no dizziness, no lightheadedness, no abdominal pain, no nausea, no changes in his bowel habits, no urinary symptoms, no chest pain, no blood pressure no focal deficit. Sodium 135, potassium 2.3, chloride 97, bicarb 27, glucose 106, BUN 43, creatinine 2.08, calcium 7.6, alkaline phosphatase 63, albumin 2.1, AST 41, ALT 16, total protein 5.8, troponin 38, lactic acid 5.1 >> 4.3, PT 17.7, INR 1.5 Respiratory viral panel is negative Urine analysis is stable MRSA PCR was positive Chest x-ray reviewed by me shows increased additional groundglass opacities throughout both lungs suggesting possible mild edema.  Underlying chronic pleural-parenchymal scarring. Twelve-lead EKG shows atrial flutter with 2-1 block, PVCs and a right bundle branch block.   Assessment & Plan:   Principal Problem:   Sepsis ([place]) Active Problems:   Spinal stenosis   Type 2 diabetes mellitus without complication, without long-term current use of insulin (HCC)   CAD (coronary  artery disease) of artery bypass graft   Depression with anxiety   Atrial fibrillation with RVR (HCC)   Cellulitis   Hypokalemia   Malnutrition of moderate degree   # lower extremity cellulitis # Sepsis # Bacteremia Sepsis by tachycardia, lactic acidosis. S/p 1 liter in the ED. 1/4 BCs growing s aureus. ID following. Lower extremity erythema improving. Culture pan-sensitive. Repeat blood cultures ngtd. MRI of lumbar spine showed no signs infection. - cont cefazolin - TTE w/o signs endocarditis. May need TEE  # Acute hypoxic respiratory failure Likely multifactorial. CT yesterday shows worsening infiltrates, possible aspiration - add flagyl to cefazolin - cont o2  # Goals of care - palliative medicine consult placed today  # Acute kidney injury Cr 2s from baseline around 1.2. treated w/ ivf, cr stable around 2. Renal u/s w/o hydro - received lasix yesterday and again today  # Pulmonary hypertension Severe, on TTE 2/4. Night team treated with albumin/lasix. RA filling pressures low.  - I have consulted pulmonology for assistance in mgmt  # Pleural effusions Will likely need assessment, will discuss w/ pulm after they see today  # Cirrhosis # Ascites CT of chest shows cirrhotic liver, ascites. This appears to be a new dx. No SBP symptoms. Does have history heavy drinking.  - u/s liver w/ doppler ordered - will likely need fluid assessment  #  Dysphagia Esophagram on 2/3 shows "corkscrewing" of the esophagus, possible stricture, marked dysmotility. - SLP notes sig aspiration risk - will need close GI f/u at d/c  # Chronic diarrhea Established w/ GI last month - check gi pathogen panel  # Ascites Incidentally seen on renal u/s -  abd u/s ascites scan  # Hypokalemia # Hypomagenesemia Resolved - monitor  # dCHF # CAD s/p bypass graft No signs/symptoms ACS. Bnp markedly elevated on presentation. Patient reports lower extremity swelling is mild and improved from  baseline so initially treated with fluids - f/u tte - lasix given 2/4, 2/5  # A fib with rvr RVR resolved. Not angicoagulated 2/2 frequent falls - cont home propranolol  # HTN Here bp wnl - hold home losartan, cont propranolol   # Multiple myeloma - followed by onc, not currently treated  # T7 compression fracture 2/2 MM.  - pain control     DVT prophylaxis: heparin Code Status: DNR, confirmed w/ son and patient 2/3 Family Communication: son updated telephonically 2/5  Level of care: Progressive Status is: Inpatient Remains inpatient appropriate because: severity of illness    Consultants:  ID  Procedures: none  Antimicrobials:  Vanc/cefepime>cefazolin  Flagyl added 2/5   Subjective: Says breathing and anxiety improved, no pain, working with PT  Objective: Vitals:   12/13/21 0444 12/13/21 0500 12/13/21 0830 12/13/21 1049  BP: 92/61  90/62 119/65  Pulse: (!) 56  63 (!) 58  Resp: _0 Temp: 97.9 F (36.6 C)  (!) 97.2 F (36.2 C) 98.1 F (36.7 C)  TempSrc:      SpO2: 96%  96% 98%  Weight:  85.3 kg      Intake/Output Summary (Last 24 hours) at 12/13/2021 1158 Last data filed at 12/13/2021 1048 Gross per 24 hour  Intake 1273.29 ml  Output 1450 ml  Net -176.71 ml   Filed Weights   12/11/21 0413 12/12/21 0500 12/13/21 0500  Weight: 82.5 kg 82.3 kg 85.3 kg    Examination:  General exam: Appears calm and comfortable  Respiratory system: rales, exp wheeze Cardiovascular system: S1 & S2 heard, irreg irreg, soft systolic murmur No JVD, murmurs, rubs, gallops or clicks.  Gastrointestinal system: Abdomen is nondistended, soft and nontender. No organomegaly or masses felt. Normal bowel sounds heard. Central nervous system: Alert and oriented. No focal neurological deficits. Extremities: Symmetric 5 x 5 power. Skin: inerval mild impreovement in lower extremity erythema. Scabbs present. Ulcers on scalp bandaged Psychiatry: Judgement and insight appear  normal. Mood & affect appropriate.     Data Reviewed: I have personally reviewed following labs and imaging studies  CBC: Recent Labs  Lab 12/09/21 1056 12/10/21 0621 12/11/21 0950 12/12/21 0414 12/13/21 0544  WBC 10.8* 8.1 8.9 6.7 5.8  NEUTROABS 9.3*  --   --   --   --   HGB 11.5* 9.8* 11.4* 10.8* 10.3*  HCT 33.1* 27.9* 33.2* 32.2* 29.9*  MCV 110.0* 109.0* 109.2* 111.0* 108.7*  PLT 149* 129* 156 172 035   Basic Metabolic Panel: Recent Labs  Lab 12/09/21 1305 12/10/21 0621 12/10/21 2125 12/11/21 0950 12/12/21 0414 12/13/21 0544  NA 135 132*  --  131* 133* 133*  K 2.3* 2.9*  --  4.4 4.3 3.5  CL 97* 96*  --  99 101 103  CO2 27 27  --  _1 GLUCOSE 106* 117*  --  124* 101* 115*  BUN 43* 51*  --  50* 45* 45*  CREATININE 2.08* 2.16*  --  2.07* 1.97* 2.12*  CALCIUM 7.6* 7.4*  --  7.3* 7.3* 7.4*  MG 1.4* 2.5* 2.3 2.3 2.2  --    GFR: Estimated Creatinine Clearance: 30.5 mL/min (A) (by C-G formula based on SCr of 2.12 mg/dL (H)). Liver Function  Tests: Recent Labs  Lab 12/09/21 1305  AST 41  ALT 16  ALKPHOS 63  BILITOT 1.8*  PROT 5.8*  ALBUMIN 2.1*   No results for input(s): LIPASE, AMYLASE in the last 168 hours. No results for input(s): AMMONIA in the last 168 hours. Coagulation Profile: Recent Labs  Lab 12/09/21 1208 12/10/21 0621  INR 1.5* 1.5*   Cardiac Enzymes: No results for input(s): CKTOTAL, CKMB, CKMBINDEX, TROPONINI in the last 168 hours. BNP (last 3 results) No results for input(s): PROBNP in the last 8760 hours. HbA1C: No results for input(s): HGBA1C in the last 72 hours.  CBG: Recent Labs  Lab 12/12/21 0721 12/12/21 1151 12/12/21 1750 12/12/21 2021 12/13/21 0855  GLUCAP 129* 120* 145* 123* 94   Lipid Profile: No results for input(s): CHOL, HDL, LDLCALC, TRIG, CHOLHDL, LDLDIRECT in the last 72 hours. Thyroid Function Tests: No results for input(s): TSH, T4TOTAL, FREET4, T3FREE, THYROIDAB in the last 72 hours. Anemia Panel: No  results for input(s): VITAMINB12, FOLATE, FERRITIN, TIBC, IRON, RETICCTPCT in the last 72 hours. Urine analysis:    Component Value Date/Time   COLORURINE YELLOW 12/09/2021 1119   APPEARANCEUR CLEAR (A) 12/09/2021 1119   APPEARANCEUR Clear 08/07/2013 0102   LABSPEC 1.015 12/09/2021 1119   LABSPEC 1.023 08/07/2013 0102   PHURINE 5.0 12/09/2021 1119   GLUCOSEU NEGATIVE 12/09/2021 1119   GLUCOSEU 50 mg/dL 08/07/2013 0102   HGBUR NEGATIVE 12/09/2021 1119   BILIRUBINUR NEGATIVE 12/09/2021 1119   BILIRUBINUR Negative 08/07/2013 0102   KETONESUR TRACE (A) 12/09/2021 1119   PROTEINUR TRACE (A) 12/09/2021 1119   NITRITE NEGATIVE 12/09/2021 1119   LEUKOCYTESUR NEGATIVE 12/09/2021 1119   LEUKOCYTESUR Negative 08/07/2013 0102   Sepsis Labs: _0 (procalcitonin:4,lacticidven:4)  ) Recent Results (from the past 240 hour(s))  Blood Culture (routine x 2)     Status: Abnormal   Collection Time: 12/09/21 10:57 AM   Specimen: BLOOD  Result Value Ref Range Status   Specimen Description   Final    BLOOD LEFT WRIST Performed at [facility], [address] [place], [place] [address]    Special Requests   Final    BOTTLES DRAWN AEROBIC AND ANAEROBIC Blood Culture adequate volume Performed at [facility], [address] [place], [place] [address]    Culture  Setup Time   Final    GRAM POSITIVE COCCI AEROBIC BOTTLE ONLY CRITICAL RESULT CALLED TO, READ BACK BY AND VERIFIED WITH: JASON ROBBINS @ 0149 ON 12/10/21.Marland KitchenMarland KitchenTKR Performed at [facility], [place] [address], [place], [place] [address]    Culture STAPHYLOCOCCUS AUREUS (A)  Final   Report Status 12/12/2021 FINAL  Final   Organism ID, Bacteria STAPHYLOCOCCUS AUREUS  Final      Susceptibility   Staphylococcus aureus - MIC*    CIPROFLOXACIN <=0.5 SENSITIVE Sensitive     ERYTHROMYCIN <=0.25 SENSITIVE Sensitive     GENTAMICIN <=0.5 SENSITIVE Sensitive     OXACILLIN <=0.25 SENSITIVE Sensitive     TETRACYCLINE <=1  SENSITIVE Sensitive     VANCOMYCIN <=0.5 SENSITIVE Sensitive     TRIMETH/SULFA <=10 SENSITIVE Sensitive     CLINDAMYCIN <=0.25 SENSITIVE Sensitive     RIFAMPIN <=0.5 SENSITIVE Sensitive     Inducible Clindamycin NEGATIVE Sensitive     * STAPHYLOCOCCUS AUREUS  Blood Culture ID Panel (Reflexed)     Status: Abnormal   Collection Time: 12/09/21 10:57 AM  Result Value Ref Range Status   Enterococcus faecalis NOT DETECTED NOT DETECTED Final   Enterococcus Faecium  NOT DETECTED NOT DETECTED Final   Listeria monocytogenes NOT DETECTED NOT DETECTED Final   Staphylococcus species DETECTED (A) NOT DETECTED Final    Comment: CRITICAL RESULT CALLED TO, READ BACK BY AND VERIFIED WITH: JASON ROBBINS @ 0149 ON 12/10/21.Marland KitchenMarland KitchenTKR    Staphylococcus aureus (BCID) DETECTED (A) NOT DETECTED Final    Comment: CRITICAL RESULT CALLED TO, READ BACK BY AND VERIFIED WITH: JASON ROBBINS @ 0149 ON 12/10/21.Marland KitchenMarland KitchenTKR    Staphylococcus epidermidis NOT DETECTED NOT DETECTED Final   Staphylococcus lugdunensis NOT DETECTED NOT DETECTED Final   Streptococcus species NOT DETECTED NOT DETECTED Final   Streptococcus agalactiae NOT DETECTED NOT DETECTED Final   Streptococcus pneumoniae NOT DETECTED NOT DETECTED Final   Streptococcus pyogenes NOT DETECTED NOT DETECTED Final   A.calcoaceticus-baumannii NOT DETECTED NOT DETECTED Final   Bacteroides fragilis NOT DETECTED NOT DETECTED Final   Enterobacterales NOT DETECTED NOT DETECTED Final   Enterobacter cloacae complex NOT DETECTED NOT DETECTED Final   Escherichia coli NOT DETECTED NOT DETECTED Final   Klebsiella aerogenes NOT DETECTED NOT DETECTED Final   Klebsiella oxytoca NOT DETECTED NOT DETECTED Final   Klebsiella pneumoniae NOT DETECTED NOT DETECTED Final   Proteus species NOT DETECTED NOT DETECTED Final   Salmonella species NOT DETECTED NOT DETECTED Final   Serratia marcescens NOT DETECTED NOT DETECTED Final   Haemophilus influenzae NOT DETECTED NOT DETECTED Final    Neisseria meningitidis NOT DETECTED NOT DETECTED Final   Pseudomonas aeruginosa NOT DETECTED NOT DETECTED Final   Stenotrophomonas maltophilia NOT DETECTED NOT DETECTED Final   Candida albicans NOT DETECTED NOT DETECTED Final   Candida auris NOT DETECTED NOT DETECTED Final   Candida glabrata NOT DETECTED NOT DETECTED Final   Candida krusei NOT DETECTED NOT DETECTED Final   Candida parapsilosis NOT DETECTED NOT DETECTED Final   Candida tropicalis NOT DETECTED NOT DETECTED Final   Cryptococcus neoformans/gattii NOT DETECTED NOT DETECTED Final   Meth resistant mecA/C and MREJ NOT DETECTED NOT DETECTED Final    Comment: Performed at [facility], [address] [place], [place] [address]  Blood Culture (routine x 2)     Status: None (Preliminary result)   Collection Time: 12/09/21 10:59 AM   Specimen: BLOOD  Result Value Ref Range Status   Specimen Description BLOOD RIGHT [facility]  Final   Special Requests   Final    BOTTLES DRAWN AEROBIC AND ANAEROBIC Blood Culture adequate volume   Culture   Final    NO GROWTH 4 DAYS Performed at [facility], [address], [place], [place] [address]    Report Status PENDING  Incomplete  Urine Culture     Status: Abnormal   Collection Time: 12/09/21 11:19 AM   Specimen: In/Out Cath Urine  Result Value Ref Range Status   Specimen Description   Final    IN/OUT CATH URINE Performed at [facility], [address], [place], [place] [address]    Special Requests   Final    NONE Performed at [facility], [address] [place], [place] [address]    Culture 1,000 COLONIES/mL ENTEROCOCCUS FAECALIS (A)  Final   Report Status 12/12/2021 FINAL  Final   Organism ID, Bacteria ENTEROCOCCUS FAECALIS (A)  Final      Susceptibility   Enterococcus faecalis - MIC*    AMPICILLIN <=2 SENSITIVE Sensitive     NITROFURANTOIN <=16 SENSITIVE Sensitive     VANCOMYCIN 1 SENSITIVE Sensitive     * 1,000 COLONIES/mL ENTEROCOCCUS  FAECALIS  Resp  Panel by RT-PCR (Flu A&B, Covid) Nasopharyngeal Swab     Status: None   Collection Time: 12/09/21 11:19 AM   Specimen: Nasopharyngeal Swab; Nasopharyngeal(NP) swabs in vial transport medium  Result Value Ref Range Status   SARS Coronavirus 2 by RT PCR NEGATIVE NEGATIVE Final    Comment: (NOTE) SARS-CoV-2 target nucleic acids are NOT DETECTED.  The SARS-CoV-2 RNA is generally detectable in upper respiratory specimens during the acute phase of infection. The lowest concentration of SARS-CoV-2 viral copies this assay can detect is 138 copies/mL. A negative result does not preclude SARS-Cov-2 infection and should not be used as the sole basis for treatment or other patient management decisions. A negative result may occur with  improper specimen collection/handling, submission of specimen other than nasopharyngeal swab, presence of viral mutation(s) within the areas targeted by this assay, and inadequate number of viral copies(<138 copies/mL). A negative result must be combined with clinical observations, patient history, and epidemiological information. The expected result is Negative.  Fact Sheet for Patients:  EntrepreneurPulse.com.au  Fact Sheet for Healthcare Providers:  IncredibleEmployment.be  This test is no t yet approved or cleared by the [place] FDA and  has been authorized for detection and/or diagnosis of SARS-CoV-2 by FDA under an Emergency Use Authorization (EUA). This EUA will remain  in effect (meaning this test can be used) for the duration of the COVID-19 declaration under Section 564(b)(1) of the Act, 21 U.S.C.section 360bbb-3(b)(1), unless the authorization is terminated  or revoked sooner.       Influenza A by PCR NEGATIVE NEGATIVE Final   Influenza B by PCR NEGATIVE NEGATIVE Final    Comment: (NOTE) The Xpert Xpress SARS-CoV-2/FLU/RSV plus assay is intended as an aid in the diagnosis of influenza from  Nasopharyngeal swab specimens and should not be used as a sole basis for treatment. Nasal washings and aspirates are unacceptable for Xpert Xpress SARS-CoV-2/FLU/RSV testing.  Fact Sheet for Patients: EntrepreneurPulse.com.au  Fact Sheet for Healthcare Providers: IncredibleEmployment.be  This test is not yet approved or cleared by the [place] FDA and has been authorized for detection and/or diagnosis of SARS-CoV-2 by FDA under an Emergency Use Authorization (EUA). This EUA will remain in effect (meaning this test can be used) for the duration of the COVID-19 declaration under Section 564(b)(1) of the Act, 21 U.S.C. section 360bbb-3(b)(1), unless the authorization is terminated or revoked.  Performed at [facility], [address] [place], [place] [address]   MRSA Next Gen by PCR, Nasal     Status: Abnormal   Collection Time: 12/09/21  1:24 PM   Specimen: Nasal Mucosa; Nasal Swab  Result Value Ref Range Status   MRSA by PCR Next Gen DETECTED (A) NOT DETECTED Final    Comment: RESULT CALLED TO, READ BACK BY AND VERIFIED WITH: JON COOK 1525 12/09/21 MU (NOTE) The GeneXpert MRSA Assay (FDA approved for NASAL specimens only), is one component of a comprehensive MRSA colonization surveillance program. It is not intended to diagnose MRSA infection nor to guide or monitor treatment for MRSA infections. Test performance is not FDA approved in patients less than 49 years old. Performed at [facility], [place], [place] [address]   Aerobic Culture w Gram Stain (superficial specimen)     Status: None (Preliminary result)   Collection Time: 12/10/21  5:31 PM   Specimen: SCALP; Wound  Result Value Ref Range Status   Specimen Description   Final    SCALP Performed at [facility], [address], [place], [place] [address]    Special Requests   Final    NONE Performed at [facility], [place], [place] [address]    Gram Stain   Final    FEW WBC PRESENT, PREDOMINANTLY MONONUCLEAR MODERATE GRAM POSITIVE COCCI    Culture   Final    ABUNDANT STAPHYLOCOCCUS AUREUS SUSCEPTIBILITIES TO FOLLOW Performed at [facility], [place] [address] [place], [place] [address]    Report Status PENDING  Incomplete  CULTURE, BLOOD (ROUTINE X 2) w Reflex to ID Panel     Status: None (Preliminary result)   Collection Time: 12/11/21  6:59 AM   Specimen: BLOOD  Result Value Ref Range Status   Specimen Description BLOOD LEFT WRIST  Final   Special Requests   Final    BOTTLES DRAWN AEROBIC AND ANAEROBIC Blood Culture adequate volume   Culture   Final    NO GROWTH 2 DAYS Performed at [facility], [address] [place], [place] [address]    Report Status PENDING  Incomplete  CULTURE, BLOOD (ROUTINE X 2) w Reflex to ID Panel     Status: None (Preliminary result)   Collection Time: 12/11/21  7:16 AM   Specimen: BLOOD  Result Value Ref Range Status   Specimen Description BLOOD LEFT ARM  Final   Special Requests   Final    BOTTLES DRAWN AEROBIC AND ANAEROBIC Blood Culture adequate volume   Culture   Final    NO GROWTH 2 DAYS Performed at [facility], [address], [place], [place] [address]    Report Status PENDING  Incomplete         Radiology Studies: CT CHEST WO CONTRAST  Result Date: 12/12/2021 CLINICAL DATA:  Aspiration hypoxia. Fluid overload. Infection. Shortness of breath. EXAM: CT CHEST WITHOUT CONTRAST TECHNIQUE: Multidetector CT imaging of the chest was performed following the standard protocol without IV contrast. RADIATION DOSE REDUCTION: This exam was performed according to the departmental dose-optimization program which includes automated exposure control, adjustment of the mA and/or kV according to patient size and/or use of iterative reconstruction technique. COMPARISON:  Chest radiograph 12/11/2021. Esophagram 12/11/2021. CT  12/09/2021 FINDINGS: Cardiovascular: Cardiac enlargement. No pericardial effusions. Postoperative changes in the mediastinum consistent with coronary bypass. Calcification of the aorta. No aneurysm. Mediastinum/Nodes: Thyroid gland is unremarkable. Esophagus is decompressed. Scattered lymph nodes throughout the mediastinum without pathologic enlargement, likely reactive. Lungs/Pleura: Small bilateral pleural effusions. Pleural calcification on the left may represent post inflammatory process. Diffuse emphysematous changes in the lungs. Atelectasis or consolidation in both lung bases with patchy areas of airspace infiltration seen in both lungs, most prominently in the right upper lung. Pulmonary infiltration is progressing since previous study, possibly progressing pneumonia or interval aspiration. Upper Abdomen: Upper abdominal ascites. Liver morphology consistent with hepatic cirrhosis. Mild gynecomastia. Musculoskeletal: Sternotomy wires. Degenerative changes in the spine and shoulders. Prominent thoracic kyphosis and mild scoliosis convex towards the right. Multiple lucent lesions, some with expansile change, are demonstrated in several ribs and in the spine. This is consistent with history of multiple myeloma. Several vertebral compression deformities are present. There is an acute or subacute appearing fracture of the body of a midthoracic vertebra, likely T7. This likely represents a pathologic fracture. No change since the previous study from several days ago. IMPRESSION: 1. Bilateral pleural effusions with progressing infiltrates, particularly on the right. This could indicate progression of pneumonia or interval aspiration. 2. Stable appearance of  multiple bone lesions and vertebral fractures of differing ages, including an acute appearing fracture probably at T7. No change since previous study. 3. Changes of hepatic cirrhosis with upper abdominal ascites. 4. Aortic atherosclerosis. 5. Emphysematous  changes. Electronically Signed   By: Lucienne Capers M.D.   On: 12/12/2021 17:58   MR LUMBAR SPINE WO CONTRAST  Result Date: 12/12/2021 CLINICAL DATA:  History of epidural steroid injections in lumbar spine, bacteremia, concern for osteomyelitis. EXAM: MRI LUMBAR SPINE WITHOUT CONTRAST TECHNIQUE: Multiplanar, multisequence MR imaging of the lumbar spine was performed. No intravenous contrast was administered. COMPARISON:  04/06/2019 FINDINGS: Segmentation:  Standard. Alignment:  Levocurvature of the lumbar spine. Vertebrae: No acute fracture or suspicious osseous lesion. No evidence of discitis or osteomyelitis. Unchanged degenerative changes at the anterior superior endplate of L5. Vertebral body endplates are otherwise intact.Redemonstrated cystic lesion in the left iliac bone, which appears unchanged compared to 2020. Conus medullaris and cauda equina: Conus extends to the T12 level. Conus and cauda equina appear normal. No epidural collection. Paraspinal and other soft tissues: Negative. Disc levels: L1-L2: No significant disc bulge. No spinal canal stenosis or neural foraminal narrowing. L2-L3: Mild disc bulge. Mild facet arthropathy. Narrowing of the lateral recesses. No spinal canal stenosis or neural foraminal narrowing. L3-L4: Mild disc bulge. Moderate right and mild left facet arthropathy. Narrowing of the lateral recesses. No spinal canal stenosis or neural foraminal narrowing. L4-L5: Mild disc bulge. Severe facet arthropathy. Effacement of the lateral recesses. Moderate spinal canal stenosis, which appears similar to the prior exam. Unchanged moderate bilateral neural foraminal narrowing. L5-S1: No significant disc bulge. Moderate right and mild left facet arthropathy. No spinal canal stenosis or neural foraminal narrowing. IMPRESSION: 1. No acute osseous abnormality. No evidence of discitis osteomyelitis. 2. L4-L5 moderate spinal canal stenosis and moderate bilateral neural foraminal narrowing,  unchanged. Effacement of the lateral recesses at this level likely compresses the descending L5 nerve roots. 3. Narrowing of the lateral recesses at L2-L3 and L3-L4 could affect the descending L3 and L4 nerve roots, respectively. Electronically Signed   By: Merilyn Baba M.D.   On: 12/12/2021 03:16   US RENAL  Result Date: 12/11/2021 CLINICAL DATA:  Acute kidney injury EXAM: RENAL / URINARY TRACT ULTRASOUND COMPLETE COMPARISON:  None. FINDINGS: Right Kidney: Renal measurements: 10.4 x 4.6 x 5.4 cm = volume: 135.2 mL. Echogenicity within normal limits. No mass or hydronephrosis visualized. Left Kidney: Renal measurements: 9.4 x 5 x 4 cm = volume: 99.1 mL. Echogenicity within normal limits. No mass or hydronephrosis visualized. Bladder: Appears normal for degree of bladder distention. Suboptimal evaluation due to bandage per technologist. Other: Ascites. IMPRESSION: No hydronephrosis. Ascites. Electronically Signed   By: Macy Mis M.D.   On: 12/11/2021 16:54   DG Chest Port 1 View  Result Date: 12/11/2021 CLINICAL DATA:  Aspiration pneumonia EXAM: PORTABLE CHEST 1 VIEW COMPARISON:  CT chest 12/09/2021 FINDINGS: Bilateral patchy interstitial and alveolar airspace opacities. No pleural effusion or pneumothorax. Stable cardiomegaly. Prior CABG. No acute osseous abnormality. IMPRESSION: 1. Bilateral patchy interstitial and alveolar airspace opacities concerning for multilobar pneumonia versus pulmonary edema. Electronically Signed   By: Kathreen Devoid M.D.   On: 12/11/2021 18:55   DG Chest Port 1 View  Result Date: 12/11/2021 CLINICAL DATA:  Shortness of breath EXAM: PORTABLE CHEST 1 VIEW COMPARISON:  Previous studies including the examination of 12/09/2021 FINDINGS: Transverse diameter of heart is increased. Central pulmonary vessels are prominent. Breathing motion limits evaluation of lung fields. There are  no signs of alveolar pulmonary edema. There is asymmetric pleural density in the lateral aspect of  left apex with no significant change. There is some crowding of markings in the lower lung fields. No new focal pulmonary consolidation is seen. There is blunting of lateral CP angles, more so on the left side. There is no pneumothorax. Multiple old fractures are seen in the left ribs. There is evidence of previous coronary bypass surgery. IMPRESSION: Cardiomegaly. There are no signs of alveolar pulmonary edema or new focal pulmonary consolidation. Small bilateral pleural effusions, more so on the left side. Electronically Signed   By: Elmer Picker M.D.   On: 12/11/2021 15:27   ECHOCARDIOGRAM COMPLETE  Result Date: 12/12/2021    ECHOCARDIOGRAM REPORT   Patient Name:   Tashi Band. Date of Exam: 12/12/2021 Medical Rec #:  [id number]        Height:       67.0 in Accession #:    [id number]       Weight:       181.4 lb Date of Birth:  [birth year]       BSA:          1.940 m Patient Age:    31 years         BP:           98/63 mmHg Patient Gender: M                HR:           62 bpm. Exam Location:  [facility] Procedure: 2D Echo Indications:     Bacteremia R78.81  History:         Patient has prior history of Echocardiogram examinations, most                  recent 11/10/2020.  Sonographer:     Kathlen Brunswick RDCS Referring Phys:  [place] Diagnosing Phys: [place]  1. Left ventricular ejection fraction, by estimation, is 60 to 65%. The left ventricle has normal function. The left ventricle has no regional wall motion abnormalities. Left ventricular diastolic parameters are indeterminate. There is the interventricular septum is flattened in systole, consistent with right ventricular pressure overload.  2. Right ventricular systolic function is severely reduced. The right ventricular size is severely enlarged. There is severely elevated pulmonary artery systolic pressure. The estimated right ventricular systolic pressure is 82.9 mmHg.  3. Right atrial size was severely dilated.  4.  The mitral valve is degenerative. Mild to moderate mitral valve regurgitation. No evidence of mitral stenosis.  5. Tricuspid valve regurgitation is severe.  6. The aortic valve is normal in structure. Aortic valve regurgitation is not visualized. No aortic stenosis is present.  7. The inferior vena cava is normal in size with greater than 50% respiratory variability, suggesting right atrial pressure of 3 mmHg.  8. Cannot exclude a small PFO. FINDINGS  Left Ventricle: Left ventricular ejection fraction, by estimation, is 60 to 65%. The left ventricle has normal function. The left ventricle has no regional wall motion abnormalities. The left ventricular internal cavity size was small. There is no left ventricular hypertrophy. The interventricular septum is flattened in systole, consistent with right ventricular pressure overload. Left ventricular diastolic parameters are indeterminate. Right Ventricle: The right ventricular size is severely enlarged. Right vetricular wall thickness was not well visualized. Right ventricular systolic function is severely reduced. There is severely elevated pulmonary artery systolic pressure. The tricuspid regurgitant velocity  is 4.50 m/s, and with an assumed right atrial pressure of 10 mmHg, the estimated right ventricular systolic pressure is 01.6 mmHg. Left Atrium: Left atrial size was normal in size. Right Atrium: Right atrial size was severely dilated. Pericardium: There is no evidence of pericardial effusion. Mitral Valve: The mitral valve is degenerative in appearance. Mild to moderate mitral valve regurgitation. No evidence of mitral valve stenosis. Tricuspid Valve: The tricuspid valve is normal in structure. Tricuspid valve regurgitation is severe. No evidence of tricuspid stenosis. Aortic Valve: The aortic valve is normal in structure. Aortic valve regurgitation is not visualized. No aortic stenosis is present. Aortic valve peak gradient measures 5.5 mmHg. Pulmonic Valve: The  pulmonic valve was grossly normal. Pulmonic valve regurgitation is not visualized. No evidence of pulmonic stenosis. Aorta: The aortic root is normal in size and structure. Venous: The inferior vena cava is normal in size with greater than 50% respiratory variability, suggesting right atrial pressure of 3 mmHg. IAS/Shunts: Cannot exclude a small PFO.  LEFT VENTRICLE PLAX 2D LVIDd:         4.90 cm     Diastology LVIDs:         3.50 cm     LV e' medial:    4.24 cm/s LV PW:         1.20 cm     LV E/e' medial:  9.8 LV IVS:        1.20 cm     LV e' lateral:   5.00 cm/s LVOT diam:     2.10 cm     LV E/e' lateral: 8.3 LV SV:         38 LV SV Index:   20 LVOT Area:     3.46 cm  LV Volumes (MOD) LV vol d, MOD A4C: 37.4 ml LV vol s, MOD A4C: 16.5 ml LV SV MOD A4C:     37.4 ml RIGHT VENTRICLE RV Basal diam:  5.80 cm RV S prime:     9.46 cm/s TAPSE (M-mode): 1.9 cm LEFT ATRIUM             Index        RIGHT ATRIUM           Index LA diam:        3.50 cm 1.80 cm/m   RA Area:     18.40 cm LA Vol (A2C):   30.8 ml 15.87 ml/m  RA Volume:   53.10 ml  27.37 ml/m LA Vol (A4C):   18.9 ml 9.74 ml/m LA Biplane Vol: 25.3 ml 13.04 ml/m  AORTIC VALVE                 PULMONIC VALVE AV Area (Vmax): 1.57 cm     PV Vmax:          0.90 m/s AV Vmax:        117.00 cm/s  PV Peak grad:     3.3 mmHg AV Peak Grad:   5.5 mmHg     PR End Diast Vel: 16.00 msec LVOT Vmax:      52.90 cm/s LVOT Vmean:     30.600 cm/s LVOT VTI:       0.110 m  AORTA Ao Root diam: 3.60 cm Ao Asc diam:  3.10 cm MITRAL VALVE               TRICUSPID VALVE MV Area (PHT): 3.34 cm    TV Peak grad:   64.8 mmHg MV Decel Time: 227  msec    TV Vmax:        4.03 m/s MV E velocity: 41.60 cm/s  TR Peak grad:   81.0 mmHg MV A velocity: 63.00 cm/s  TR Vmax:        450.00 cm/s MV E/A ratio:  0.66                            SHUNTS                            Systemic VTI:  0.11 m                            Systemic Diam: 2.10 cm Donnelly Angelica Electronically signed by Donnelly Angelica Signature  Date/Time: 12/12/2021/3:04:08 PM    Final    DG ESOPHAGUS W DOUBLE CM (HD)  Result Date: 12/11/2021 CLINICAL DATA:  A 76 year old male presents for evaluation of swallowing and dysphagia. EXAM: ESOPHOGRAM/BARIUM SWALLOW TECHNIQUE: Single contrast examination was performed using  thin barium. FLUOROSCOPY: Fluoroscopy time: 2 minutes 48 seconds Fluoroscopy dose: 50.7 mGy COMPARISON:  Chest CT from December 09, 2021. FINDINGS: The exam was markedly limited due to the patient's debilitated state and difficulty in positioning the patient. Exam was performed in slightly LPO just off AP and RPO positioning. The esophagus was found to be markedly patulous above the GE junction where there was tortuosity and extensive tertiary contractions throughout the exam. Long segment narrowing of the GE junction was noted on multiple images though this could not be challenged with a large bolus and the presence of dysmotility also limited assessment of this area. This appears to be smooth and long segment. Along the distal medial esophagus there are small pulsion diverticula with distortion of the medial esophageal wall. Contrast did pass into the stomach aided by the administration of some water. Additional images could not be obtained due to coughing that the patient experienced though there was no visible "gross" aspiration.There may be some laryngeal penetration though this area was difficult to evaluate. In the RPO position marked "corkscrewing" and tortuosity of the proximal esophagus was noted with the patient in kyphotic position at baseline. IMPRESSION: 1. Markedly patulous esophagus above the GE junction with extensive tertiary contractions throughout the exam. "Corkscrewing" of the esophagus as well was noted with limited peristaltic activity distally though there was emptying of the esophagus despite segmental narrowing of the distal esophagus into the stomach. Findings could relate to distal esophageal stricture but could  not be well assessed due to the debilitated nature of the patient. Marked dysmotility and the long segment narrowing with beaked appearance raising the question achalasia. Esophageal manometry and endoscopy may be helpful to narrow the differential. 2. Given difficulty with swallowing a dedicated swallow function may also be helpful. Electronically Signed   By: Zetta Bills M.D.   On: 12/11/2021 16:04        Scheduled Meds:  ascorbic acid  500 mg Oral Daily   aspirin  81 mg Oral Daily   calcium-vitamin D  1 tablet Oral BID   Chlorhexidine Gluconate Cloth  6 each Topical Q0600   heparin  5,000 Units Subcutaneous Q8H   insulin aspart  0-15 Units Subcutaneous TID WC   lidocaine  1 patch Transdermal Q24H   loratadine  10 mg Oral Daily   multivitamin with minerals  1 tablet Oral  Daily   mupirocin ointment  1 application Nasal BID   pantoprazole  40 mg Oral Daily   PARoxetine  10 mg Oral QHS   pravastatin  40 mg Oral q1800   propranolol  10 mg Oral BID   Vitamin D (Ergocalciferol)  50,000 Units Oral Weekly   Continuous Infusions:  sodium chloride Stopped (12/12/21 1350)    ceFAZolin (ANCEF) IV 2 g (12/13/21 0445)     LOS: 4 days    Time spent: 64 min    Desma Maxim, MD [facility]   If 7PM-7AM, please contact night-coverage [email or web address] Password [facility] 12/13/2021, 11:58 AM

## 2021-12-13 NOTE — Evaluation (Signed)
Occupational Therapy Evaluation Patient Details Name: Luis [name]. MRN: [id number] DOB: [birth year] Today's Date: 12/13/2021   History of Present Illness The pt is a 76 y/o male who presented to ER for worsening SOB and weakness. Per chart EMS found pt room air pulse ox to be 90-91% and BP in 80s. Pt MRSA PCR was positive. Pt chest XRAY showed increased additional groundglass opacities throughout both lungs suggestive of possible mild edema. Underlying chronic pleural-parenchymal scarring. Pt with bilateral lower extremity cellulitis and pt also found to have atrial flutter with 2-1 block, PVCs and right bundle branch block. PMH significant for CAD status post CABG, multiple myeloma, diabetes mellitus with stage III chronic kidney disease, HTN, anxiety and depression. Pt is a resident at [facility].   Clinical Impression   Pt seen for OT evaluation this date. Upon arrival to room, pt awake and seated upright in recliner. Pt agreeable to OT eval/tx. Prior to admission, pt was living at an assistive living facility [facility]), walking household distances with [facility] (using manual wheelchair for longer distances), and performing ADLs independently (MOD-I for LB dressing with AD). Pt reports hx of several recent falls' pt reports that previous PTs have recommended that he use RW instead of SPC, however, pt reports he continues to use SPC instead. Pt currently presents with decreased balance, strength, activity tolerance, and safety awareness. Due to these functional impairments, pt requires MIN GUARD for sit>stand transfers from recliner, MIN A for functional mobility of short household distances (~70f) with RW, MIN A for steadying during sit>stand peri-care, and MIN A for steadying during standing hand hygiene. At end of session, pt left sitting EOB with PT present. Pt would benefit from additional skilled OT services to maximize return to PLOF and minimize risk of future falls, injury, caregiver  burden, and readmission. Upon discharge, recommend SNF.      Recommendations for follow up therapy are one component of a multi-disciplinary discharge planning process, led by the attending physician.  Recommendations may be updated based on patient status, additional functional criteria and insurance authorization.   Follow Up Recommendations  Skilled nursing-short term rehab (<3 hours/day)    Assistance Recommended at Discharge Frequent or constant Supervision/Assistance  Patient can return home with the following A little help with walking and/or transfers;A lot of help with bathing/dressing/bathroom    Functional Status Assessment  Patient has had a recent decline in their functional status and demonstrates the ability to make significant improvements in function in a reasonable and predictable amount of time.  Equipment Recommendations  Other (comment) (defer to next venue of care)       Precautions / Restrictions Precautions Precautions: Fall Restrictions Weight Bearing Restrictions: No      Mobility Bed Mobility               General bed mobility comments: not assessed, seated in recliner at beginning of session and EOB with PT at end of session    Transfers Overall transfer level: Needs assistance Equipment used: Rolling walker (2 wheels) Transfers: Sit to/from Stand Sit to Stand: Min assist, Min guard           General transfer comment: Requires MIN GUARD from recliner (with trunk rocking for momentum and with use of arm rests) and MIN A from EOB      Balance Overall balance assessment: Mild deficits observed, not formally tested, History of Falls  ADL either performed or assessed with clinical judgement   ADL Overall ADL's : Needs assistance/impaired     Grooming: Wash/dry hands;Minimal assistance;Standing Grooming Details (indicate cue type and reason): requires MIN A for standing balance  d/t posterior lean during hand hygiene             Lower Body Dressing: Maximal assistance;Sitting/lateral leans Lower Body Dressing Details (indicate cue type and reason): to don socks. Typically uses sock aide at home     [place] and Hygiene: Minimal assistance;Sit to/from stand Toileting - Clothing Manipulation Details (indicate cue type and reason): Requires MIN A for steadying during sit>stand peri-care     Functional mobility during ADLs: Minimal assistance;Cane;Cueing for safety (to walk ~10 ft with SPC (while simultaneously seeking furniture for external support). Walks for the most part with MIN GUARD, however intermittently requires MIN A d/t lateral/posterior lean at times)       Vision Baseline Vision/History: 1 Wears glasses (hx of cataract surgery) Ability to See in Adequate Light: 0 Adequate Patient Visual Report: No change from baseline              Pertinent Vitals/Pain Pain Assessment Pain Assessment: 0-10 Pain Score: 4  Pain Location: Pt reports pain in hips, BLEs, and bottom Pain Descriptors / Indicators: Aching Pain Intervention(s): Limited activity within patient's tolerance, Monitored during session, Repositioned     Hand Dominance Right   Extremity/Trunk Assessment Upper Extremity Assessment Upper Extremity Assessment: Generalized weakness   Lower Extremity Assessment Lower Extremity Assessment: Generalized weakness       Communication Communication Communication: No difficulties;Other (comment) (pt can respond to cuing but does have difficulty staying on task)   Cognition Arousal/Alertness: Awake/alert Behavior During Therapy: WFL for tasks assessed/performed Overall Cognitive Status: No family/caregiver present to determine baseline cognitive functioning                                 General Comments: Pleasant and agreeable throughout, however requires cues for maintaining attention to task at  hand. Presents with some poor safety awareness     General Comments  Pt reports hx of several recent falls. Pt has received PT in the past with recommendation to use RW instead of SPC, however, pt reports he continues to use SPC instead.            Home Living Family/patient expects to be discharged to:: Assisted living [facility]) Living Arrangements: Alone Available Help at Discharge: Other (Comment);Available PRN/intermittently (Pt resident of [place]. Difficult to get info from pt, however, he does report he does not get "much help" with his current living situation, does not get assist with ADLs) Type of Home: Assisted living                       Home Equipment: Rolling Walker (2 wheels);Cane - single point;Wheelchair - Scientist, physiological: Sock aid Additional Comments: Pt reports he has a RW but does not use it and prefers to use his [facility] for short distances. Pt has manual WC he uses for longer distances (uses LEs to propel). Reports that he sleeps in his recliner at baseline      Prior Functioning/Environment Prior Level of Function : Other (comment);History of Falls (last six months);Patient poor historian/Family not available;Independent/Modified Independent (Mod I mobility via SPC or wc)             Mobility  Comments: Pt reports prior to recent illness he was mod I with SPC mobility of short household distances.Uses manual wheelchair for community distances. Pt endorses multiple falls over past few months ADLs Comments: Pt is independent for ADLs at baseline, however, did report that he does not bathe "as much as I should" due to difficulty with task and states, "I wash the important parts."        OT Problem List: Decreased strength;Decreased activity tolerance;Impaired balance (sitting and/or standing);Decreased safety awareness      OT Treatment/Interventions: Self-care/ADL training;Therapeutic exercise;Energy conservation;DME  and/or AE instruction;Therapeutic activities;Patient/family education;Balance training    OT Goals(Current goals can be found in the care plan section) Acute Rehab OT Goals Patient Stated Goal: to regain independence OT Goal Formulation: With patient Time For Goal Achievement: 12/27/21 Potential to Achieve Goals: Good ADL Goals Pt Will Perform Lower Body Dressing: with min guard assist;sit to/from stand;with adaptive equipment Pt Will Transfer to Toilet: with supervision;ambulating;bedside commode Pt Will Perform Toileting - Clothing Manipulation and hygiene: with supervision;sit to/from stand  OT Frequency: Min 2X/week       AM-PAC OT "6 Clicks" Daily Activity     Outcome Measure Help from another person eating meals?: None Help from another person taking care of personal grooming?: A Little Help from another person toileting, which includes using toliet, bedpan, or urinal?: A Little Help from another person bathing (including washing, rinsing, drying)?: A Lot Help from another person to put on and taking off regular upper body clothing?: A Little Help from another person to put on and taking off regular lower body clothing?: A Lot 6 Click Score: 17   End of Session Equipment Utilized During Treatment: Gait belt;Other (comment);Oxygen [facility]) Nurse Communication: Mobility status  Activity Tolerance: Patient tolerated treatment well Patient left: in bed;Other (comment) (seated EOB with PT present)  OT Visit Diagnosis: Unsteadiness on feet (R26.81);Muscle weakness (generalized) (M62.81);History of falling (Z91.81)                Time: 0321-2248 OT Time Calculation (min): 24 min Charges:  OT General Charges $OT Visit: 1 Visit OT Evaluation $OT Eval Moderate Complexity: 1 Mod OT Treatments $Self Care/Home Management : 8-22 mins  Fredirick Maudlin, OTR/L [place]

## 2021-12-13 NOTE — Evaluation (Signed)
Physical Therapy Evaluation Patient Details Name: Luis [name]. MRN: [id number] DOB: 1946-01-14 Today's Date: 12/13/2021  History of Present Illness  The pt is a 76 y/o male who presented to ER for worsening SOB and weakness. Per chart EMS found pt room air pulse ox to be 90-91% and BP in 80s. Pt MRSA PCR was positive. Pt chest XRAY showed increased additional groundglass opacities throughout both lungs suggestive of possible mild edema. Underlying chronic pleural-parenchymal scarring. Pt with bilateral lower extremity cellulitis and pt also found to have atrial flutter with 2-1 block, PVCs and right bundle branch block. PMH significant for CAD status post CABG, multiple myeloma, diabetes mellitus with stage III chronic kidney disease, HTN, anxiety and depression. Pt is a resident at [facility].  Clinical Impression  Pt admitted with the above conditions and diagnoses and a reported hx of frequent falls. Pt had just finished OT eval upon start of PT eval. Pt was agreeable to session. Pt sensation in tact in BLEs. Pt demonstrated sufficient LE strength to ambulate 14 ft with RW, 3L O2, via [place], and close CGA from PT, however, pt did endorse fatigue with gait and exhibited mild unsteadiness. Pt observed to have decreased B step length and heel strike with gait. Pt also appeared to rely heavily on BUEs on RW. Pt was able to complete STS from increased height (hospital bed) with min guard, however, at end of session when pt requested to be repositioned in chair he required mod assist x 2 to complete STS. Pt had difficulty staying on task with questions throughout session. Pt also exhibited decreased safety awareness throughout. The pt would benefit from further skilled PT to improve strength, balance and gait in order to return to PLOF.      Recommendations for follow up therapy are one component of a multi-disciplinary discharge planning process, led by the attending physician.  Recommendations may be  updated based on patient status, additional functional criteria and insurance authorization.  Follow Up Recommendations Skilled nursing-short term rehab (<3 hours/day)    Assistance Recommended at Discharge Intermittent Supervision/Assistance  Patient can return home with the following  A lot of help with walking and/or transfers;A lot of help with bathing/dressing/bathroom;Other (comment) (Pt [facility] resident. Currently does not receive much assist at [facility]. Pt current functional mobility indicates he is an unsafe ambulator without assistance.)    Equipment Recommendations Rolling walker (2 wheels)  Recommendations for Other Services       Functional Status Assessment Patient has had a recent decline in their functional status and demonstrates the ability to make significant improvements in function in a reasonable and predictable amount of time.     Precautions / Restrictions Precautions Precautions: Fall Restrictions Weight Bearing Restrictions: No      Mobility  Bed Mobility                    Transfers Overall transfer level: Needs assistance Equipment used: Rolling walker (2 wheels) Transfers: Sit to/from Stand Sit to Stand: Mod assist, +2 physical assistance, Min assist           General transfer comment: From elevated hospital bed pt required min a for transfer. To readjust in chair by performing STS pt with significant difficulty, required mod a x 2.    Ambulation/Gait Ambulation/Gait assistance: Min guard Gait Distance (Feet): 14 Feet Assistive device: Rolling walker (2 wheels) Gait Pattern/deviations: Decreased step length - left, Decreased step length - right, Decreased dorsiflexion - right,  Decreased dorsiflexion - left Gait velocity: observed to be decreased, not formally tested     General Gait Details: Pt exhibited increased BUE support on RW, decreased B step length, decreased B heel strike/DF>, anterior lean. Pt on 3L O2 via [place] with  gait.  Stairs            Wheelchair Mobility    Modified Rankin (Stroke Patients Only)       Balance Overall balance assessment: Mild deficits observed, not formally tested, History of Falls                                           Pertinent Vitals/Pain Pain Assessment Pain Assessment: 0-10 Pain Score: 4  Pain Location: Pt reports pain in hips, BLEs, and bottom Pain Intervention(s): Limited activity within patient's tolerance, Monitored during session, Repositioned    Home Living Family/patient expects to be discharged to:: Assisted living Living Arrangements: Alone [facility] resident) Available Help at Discharge: Other (Comment);Available PRN/intermittently (Pt resident of [place]. Difficult to get info from pt, however, he does report he does not get "much help" with his current living situation, does not get assist with ADLs) Type of Home: Assisted living           Home Equipment: Rolling Walker (2 wheels);Cane - single point;Wheelchair - manual Additional Comments: Pt reports he has a RW but does not use it and prefers to use his [facility] for short distances. Pt has manual WC he uses for longer distances (uses LEs to propel)    Prior Function Prior Level of Function : Other (comment);History of Falls (last six months);Patient poor historian/Family not available;Independent/Modified Independent (Mod I mobility via SPC or wc)             Mobility Comments: Pt reports prior to recent illness he was mod I with mobility/ADLs. However, pt did report that he does not bathe "as much as I should" due to difficulty with task and states, "I wash the important parts."       Hand Dominance   Dominant Hand: Right    Extremity/Trunk Assessment   Upper Extremity Assessment Upper Extremity Assessment: Defer to OT evaluation    Lower Extremity Assessment Lower Extremity Assessment: Generalized weakness       Communication   Communication: No  difficulties;Other (comment) (pt can respond to cuing but does have difficulty staying on task)  Cognition Arousal/Alertness: Awake/alert Behavior During Therapy: WFL for tasks assessed/performed Overall Cognitive Status: No family/caregiver present to determine baseline cognitive functioning                                 General Comments: Unable to fully determine if pt is at baseline. Pt with some difficulty responding to cues/questions, unclear if [facility] or just difficulty with staying on task.        General Comments General comments (skin integrity, edema, etc.): Pt reports hx of several recent falls. Pt has received PT in the past with recommendation to use RW instead of SPC, however, pt reports he continues to use SPC instead. PT educates pt on importance of using his RW instead of SPC to prevent falls.    Exercises General Exercises - Lower Extremity Ankle Circles/Pumps: AROM, 10 reps, Both Straight Leg Raises: AROM, Right, Left, 5 reps Other Exercises Other Exercises: Pt  completed 2 STS within session (see note for details). Pt ambulated 14 ft with RW 3L O2 and close CGA. Pt instructed pt in importance of intermittent use of ankle pumps throughout day while pt in chair or bed.   Assessment/Plan    PT Assessment Patient needs continued PT services  PT Problem List Decreased strength;Decreased activity tolerance;Decreased balance;Decreased mobility;Decreased range of motion;Decreased knowledge of use of DME;Decreased safety awareness;Pain       PT Treatment Interventions DME instruction;Gait training;Stair training;Functional mobility training;Therapeutic activities;Therapeutic exercise;Balance training;Neuromuscular re-education;Patient/family education;Wheelchair mobility training;Manual techniques    PT Goals (Current goals can be found in the Care Plan section)       Frequency Min 2X/week     Co-evaluation               AM-PAC PT "6 Clicks"  Mobility  Outcome Measure Help needed turning from your back to your side while in a flat bed without using bedrails?: A Little Help needed moving from lying on your back to sitting on the side of a flat bed without using bedrails?: A Little Help needed moving to and from a bed to a chair (including a wheelchair)?: A Little Help needed standing up from a chair using your arms (e.g., wheelchair or bedside chair)?: A Lot Help needed to walk in hospital room?: A Lot (Pt fatigues) Help needed climbing 3-5 steps with a railing? : A Lot 6 Click Score: 15    End of Session Equipment Utilized During Treatment: Gait belt;Oxygen Activity Tolerance: Patient tolerated treatment well;Patient limited by fatigue Patient left: in chair;with call bell/phone within reach;with chair alarm set Nurse Communication: Mobility status PT Visit Diagnosis: Other abnormalities of gait and mobility (R26.89);Muscle weakness (generalized) (M62.81);History of falling (Z91.81);Unsteadiness on feet (R26.81);Pain Pain - Right/Left:  (bilateral LEs, bottom, hips)    Time: [id number] PT Time Calculation (min) (ACUTE ONLY): 30 min   Charges:   PT Evaluation $PT Eval Moderate Complexity: 1 Mod PT Treatments $Gait Training: 8-22 mins        Ricard Dillon PT, DPT   Zollie Pee 12/13/2021, 4:17 PM

## 2021-12-13 NOTE — Consult Note (Addendum)
PULMONOLOGY         Date: 12/13/2021,   MRN# [id number] Luis [name]. April 26, 1946     AdmissionWeight: 83.5 kg                 CurrentWeight: 85.3 kg   Referring physician: Dr Si Raider   CHIEF COMPLAINT:   Pulmonary hypertension with pleural effusions    HISTORY OF PRESENT ILLNESS   This is a 76 yo male with history of coronary artery disease status post CABG, history of multiple myeloma, diabetes mellitus with stage III chronic kidney disease, hypertension, anxiety and depression who presents to the ER for evaluation of worsening shortness of breath from his baseline and weakness. He had moderate oxygen desaturation in the field, bmp an dcbc reviewed by me with renal failure and nephrotic syndrome with lactic acidosis and electorlyte derrangements.  He is being treated for cellilitis of LE and acute on chronic CHF exacerbation. TTE was done with findings of significant pulmonary hypertension.    PAST MEDICAL HISTORY   Past Medical History:  Diagnosis Date   Angina pectoris ([place])    Anxiety    Arthritis    CHF (congestive heart failure) ([place])    Coronary artery disease    Depression    Diabetes mellitus without complication ([place])    Patient takes Metformin.   Diverticulosis    GERD (gastroesophageal reflux disease)    Hyperlipidemia    Hypertension    Multiple myeloma ([place])    Multiple myeloma ([place]) 03/27/2015   Myocardial infarction [facility]) April 2001   widowmaker   Shortness of breath dyspnea    Sleep apnea    No CPAP @ present   Spinal stenosis    Squamous cell carcinoma of skin 02/19/2014   Left dorsal hand. WD SCC with superficial infiltration. Re-shaved 04/23/2014. Re-shaved 09/05/2014.   Squamous cell carcinoma of skin 09/08/2020   Left ant scalp. WD SCC.   Squamous cell carcinoma of skin 02/04/2021   R hand dorsum, EDC      SURGICAL HISTORY   Past Surgical History:  Procedure Laterality Date   CARDIAC CATHETERIZATION     CARPAL TUNNEL  RELEASE     CATARACT EXTRACTION     COLONOSCOPY WITH PROPOFOL N/A 11/11/2015   Procedure: COLONOSCOPY WITH PROPOFOL;  Surgeon: Lollie Sails, MD;  Location: [facility] ENDOSCOPY;  Service: Endoscopy;  Laterality: N/A;   CORONARY ARTERY BYPASS GRAFT     EYE SURGERY Bilateral    Cataract Extraction   INGUINAL HERNIA REPAIR     JOINT REPLACEMENT Right 2008   Right Total Hip Replacement   PILONIDAL CYST EXCISION     ROTATOR CUFF REPAIR     TOTAL HIP ARTHROPLASTY Right    VENTRAL HERNIA REPAIR N/A 08/15/2015   Procedure: VENTRAL HERNIA REPAIR WITH MESH ;  Surgeon: Leonie Green, MD;  Location: [facility] ORS;  Service: General;  Laterality: N/A;     FAMILY HISTORY   Family History  Problem Relation Age of Onset   Heart disease Father    Stroke Mother    Prostate cancer Maternal Grandfather 39     SOCIAL HISTORY   Social History   Tobacco Use   Smoking status: Former    Packs/day: 1.50    Years: 40.00    Pack years: 60.00    Types: Cigarettes    Quit date: 02/24/2000    Years since quitting: 21.8   Smokeless tobacco: Former    Quit date: 03/04/2000  Vaping Use   Vaping Use: Never used  Substance Use Topics   Alcohol use: No   Drug use: No     MEDICATIONS    Home Medication:    Current Medication:  Current Facility-Administered Medications:    0.9 %  sodium chloride infusion, , Intravenous, PRN, Sharion Settler, NP, Stopped at 12/12/21 1350   acetaminophen (TYLENOL) tablet 650 mg, 650 mg, Oral, Q6H PRN, 650 mg at 12/13/21 0445 **OR** acetaminophen (TYLENOL) suppository 650 mg, 650 mg, Rectal, Q6H PRN, Agbata, Tochukwu, MD   ALPRAZolam Duanne Moron) tablet 0.5 mg, 0.5 mg, Oral, BID PRN, Si Raider, Ailene Rud, MD, 0.5 mg at 12/13/21 0445   ascorbic acid (VITAMIN C) tablet 500 mg, 500 mg, Oral, Daily, Agbata, Tochukwu, MD, 500 mg at 12/13/21 1020   aspirin chewable tablet 81 mg, 81 mg, Oral, Daily, Agbata, Tochukwu, MD, 81 mg at 12/13/21 1019   calcium-vitamin D (OSCAL WITH D)  500-5 MG-MCG per tablet 1 tablet, 1 tablet, Oral, BID, Agbata, Tochukwu, MD, 1 tablet at 12/13/21 1019   ceFAZolin (ANCEF) IVPB 2g/100 mL premix, 2 g, Intravenous, Q8H, Darrick Penna, RPH, Stopped at 12/13/21 1514   Chlorhexidine Gluconate Cloth 2 % PADS 6 each, 6 each, Topical, Q0600, Wouk, Ailene Rud, MD, 6 each at 12/13/21 0446   heparin injection 5,000 Units, 5,000 Units, Subcutaneous, Q8H, Wouk, Ailene Rud, MD, 5,000 Units at 12/13/21 1424   insulin aspart (novoLOG) injection 0-15 Units, 0-15 Units, Subcutaneous, TID WC, Agbata, Tochukwu, MD, 2 Units at 12/12/21 1753   ipratropium-albuterol (DUONEB) 0.5-2.5 (3) MG/3ML nebulizer solution 3 mL, 3 mL, Nebulization, Q6H PRN, Agbata, Tochukwu, MD, 3 mL at 12/10/21 1009   lidocaine (LIDODERM) 5 % 1 patch, 1 patch, Transdermal, Q24H, Sharion Settler, NP   loratadine (CLARITIN) tablet 10 mg, 10 mg, Oral, Daily, Wouk, Ailene Rud, MD, 10 mg at 12/13/21 1020   metroNIDAZOLE (FLAGYL) IVPB 500 mg, 500 mg, Intravenous, Q12H, Wouk, Ailene Rud, MD, Stopped at 12/13/21 1433   multivitamin with minerals tablet 1 tablet, 1 tablet, Oral, Daily, Wouk, Ailene Rud, MD, 1 tablet at 12/13/21 1019   mupirocin ointment (BACTROBAN) 2 % 1 application, 1 application, Nasal, BID, Wouk, Ailene Rud, MD, 1 application at 17/49/44 2138   ondansetron [employer]) tablet 4 mg, 4 mg, Oral, Q6H PRN, 4 mg at 12/11/21 2137 **OR** ondansetron (ZOFRAN) injection 4 mg, 4 mg, Intravenous, Q6H PRN, Agbata, Tochukwu, MD   pantoprazole (PROTONIX) EC tablet 40 mg, 40 mg, Oral, Daily, Agbata, Tochukwu, MD, 40 mg at 12/13/21 1020   PARoxetine (PAXIL) tablet 10 mg, 10 mg, Oral, QHS, Agbata, Tochukwu, MD, 10 mg at 12/12/21 2110   pravastatin (PRAVACHOL) tablet 40 mg, 40 mg, Oral, q1800, Agbata, Tochukwu, MD, 40 mg at 12/12/21 1751   propranolol (INDERAL) tablet 10 mg, 10 mg, Oral, BID, Sharion Settler, NP, 10 mg at 12/13/21 1019   Vitamin D (Ergocalciferol) (DRISDOL) capsule 50,000  Units, 50,000 Units, Oral, Weekly, Agbata, Tochukwu, MD    ALLERGIES   Pravastatin sodium, Statins, and Zocor [simvastatin]     REVIEW OF SYSTEMS    Review of Systems:  Gen:  Denies  fever, sweats, chills weigh loss  HEENT: Denies blurred vision, double vision, ear pain, eye pain, hearing loss, nose bleeds, sore throat Cardiac:  No dizziness, chest pain or heaviness, chest tightness,edema Resp:   Denies cough or sputum porduction, shortness of breath,wheezing, hemoptysis,  Gi: Denies swallowing difficulty, stomach pain, nausea or vomiting, diarrhea, constipation, bowel incontinence Gu:  Denies bladder incontinence,  burning urine Ext:   Denies Joint pain, stiffness or swelling Skin: Denies  skin rash, easy bruising or bleeding or hives Endoc:  Denies polyuria, polydipsia , polyphagia or weight change Psych:   Denies depression, insomnia or hallucinations   Other:  All other systems negative   VS: BP (!) 110/51 (BP Location: Right Arm)    Pulse (!) 54    Temp 97.6 F (36.4 C) (Oral)    Resp 17    Wt 85.3 kg    SpO2 96%    BMI 29.45 kg/m      PHYSICAL EXAM    GENERAL:NAD, no fevers, chills, no weakness no fatigue HEAD: Normocephalic, atraumatic.  EYES: Pupils equal, round, reactive to light. Extraocular muscles intact. No scleral icterus.  MOUTH: Moist mucosal membrane. Dentition intact. No abscess noted.  EAR, NOSE, THROAT: Clear without exudates. No external lesions.  NECK: Supple. No thyromegaly. No nodules. No JVD.  PULMONARY: Diffuse coarse rhonchi right sided +wheezes CARDIOVASCULAR: S1 and S2. Regular rate and rhythm. No murmurs, rubs, or gallops. No edema. Pedal pulses 2+ bilaterally.  GASTROINTESTINAL: Soft, nontender, nondistended. No masses. Positive bowel sounds. No hepatosplenomegaly.  MUSCULOSKELETAL: No swelling, clubbing, or edema. Range of motion full in all extremities.  NEUROLOGIC: Cranial nerves II through XII are intact. No gross focal neurological  deficits. Sensation intact. Reflexes intact.  SKIN: No ulceration, lesions, rashes, or cyanosis. Skin warm and dry. Turgor intact.  PSYCHIATRIC: Mood, affect within normal limits. The patient is awake, alert and oriented x 3. Insight, judgment intact.       IMAGING    DG Chest 2 View  Result Date: 12/01/2021 CLINICAL DATA:  Fall, tachycardia EXAM: CHEST - 2 VIEW COMPARISON:  09/07/2021 FINDINGS: Interstitial coarsening secondary to parenchymal scarring better appreciated on CT examination of 11/07/2020 within the left mid and lower lung zone is unchanged. Resultant mild left-sided volume loss again noted. No superimposed confluent pulmonary infiltrate. No pneumothorax or pleural effusion. Coronary artery bypass grafting has been performed. Cardiac size is within normal limits. Pulmonary vascularity is normal. Osseous structures are age-appropriate. No acute bone abnormality. IMPRESSION: No active cardiopulmonary disease. Electronically Signed   By: Fidela Salisbury M.D.   On: 12/01/2021 20:21   CT Head Wo Contrast  Result Date: 12/01/2021 CLINICAL DATA:  Trip and fall striking head. EXAM: CT HEAD WITHOUT CONTRAST TECHNIQUE: Contiguous axial images were obtained from the base of the skull through the vertex without intravenous contrast. RADIATION DOSE REDUCTION: This exam was performed according to the departmental dose-optimization program which includes automated exposure control, adjustment of the mA and/or kV according to patient size and/or use of iterative reconstruction technique. COMPARISON:  Provided head CT 12/21/2008 FINDINGS: Brain: Generalized cerebral atrophy. No intracranial hemorrhage, mass effect, or midline shift. No hydrocephalus. The basilar cisterns are patent. Mild periventricular and deep chronic small vessel ischemic change. No evidence of territorial infarct or acute ischemia. No extra-axial or intracranial fluid collection. Vascular: Atherosclerosis of skullbase vasculature  without hyperdense vessel or abnormal calcification. Skull: No fracture or focal lesion. Sinuses/Orbits: No acute findings. Other: Right frontal scalp hematoma. IMPRESSION: 1. Right frontal scalp hematoma. No acute intracranial abnormality. No skull fracture. 2. Generalized atrophy and chronic small vessel ischemic change. Electronically Signed   By: Keith Rake M.D.   On: 12/01/2021 18:39   CT CHEST WO CONTRAST  Result Date: 12/12/2021 CLINICAL DATA:  Aspiration hypoxia. Fluid overload. Infection. Shortness of breath. EXAM: CT CHEST WITHOUT CONTRAST TECHNIQUE: Multidetector CT imaging of the chest  was performed following the standard protocol without IV contrast. RADIATION DOSE REDUCTION: This exam was performed according to the departmental dose-optimization program which includes automated exposure control, adjustment of the mA and/or kV according to patient size and/or use of iterative reconstruction technique. COMPARISON:  Chest radiograph 12/11/2021. Esophagram 12/11/2021. CT 12/09/2021 FINDINGS: Cardiovascular: Cardiac enlargement. No pericardial effusions. Postoperative changes in the mediastinum consistent with coronary bypass. Calcification of the aorta. No aneurysm. Mediastinum/Nodes: Thyroid gland is unremarkable. Esophagus is decompressed. Scattered lymph nodes throughout the mediastinum without pathologic enlargement, likely reactive. Lungs/Pleura: Small bilateral pleural effusions. Pleural calcification on the left may represent post inflammatory process. Diffuse emphysematous changes in the lungs. Atelectasis or consolidation in both lung bases with patchy areas of airspace infiltration seen in both lungs, most prominently in the right upper lung. Pulmonary infiltration is progressing since previous study, possibly progressing pneumonia or interval aspiration. Upper Abdomen: Upper abdominal ascites. Liver morphology consistent with hepatic cirrhosis. Mild gynecomastia. Musculoskeletal:  Sternotomy wires. Degenerative changes in the spine and shoulders. Prominent thoracic kyphosis and mild scoliosis convex towards the right. Multiple lucent lesions, some with expansile change, are demonstrated in several ribs and in the spine. This is consistent with history of multiple myeloma. Several vertebral compression deformities are present. There is an acute or subacute appearing fracture of the body of a midthoracic vertebra, likely T7. This likely represents a pathologic fracture. No change since the previous study from several days ago. IMPRESSION: 1. Bilateral pleural effusions with progressing infiltrates, particularly on the right. This could indicate progression of pneumonia or interval aspiration. 2. Stable appearance of multiple bone lesions and vertebral fractures of differing ages, including an acute appearing fracture probably at T7. No change since previous study. 3. Changes of hepatic cirrhosis with upper abdominal ascites. 4. Aortic atherosclerosis. 5. Emphysematous changes. Electronically Signed   By: Lucienne Capers M.D.   On: 12/12/2021 17:58   CT CHEST WO CONTRAST  Result Date: 12/09/2021 CLINICAL DATA:  Shortness of breath. Generalized weakness. Recent fall. Sepsis protocol initiated by EMS. EXAM: CT CHEST WITHOUT CONTRAST TECHNIQUE: Multidetector CT imaging of the chest was performed following the standard protocol without IV contrast. RADIATION DOSE REDUCTION: This exam was performed according to the departmental dose-optimization program which includes automated exposure control, adjustment of the mA and/or kV according to patient size and/or use of iterative reconstruction technique. COMPARISON:  Portable chest obtained earlier today. Chest CTA dated 11/07/2020. FINDINGS: Cardiovascular: Atheromatous calcifications, including the coronary arteries and aorta. Mildly enlarged heart, unchanged. Mediastinum/Nodes: No enlarged mediastinal or axillary lymph nodes. Thyroid gland,  trachea, and esophagus demonstrate no significant findings. Lungs/Pleura: Bilateral dependent atelectasis. Stable parenchymal scarring in the posterolateral lingula. Minimal right upper lobe dependent atelectasis with improvement. No significant change in bilateral bullous changes in a centrilobular distribution. Interval minimal right pleural effusion. Small left pleural effusion without significant change. Stable pleural calcifications on the left. Upper Abdomen: Interval small amount of ascites. Contracted gallbladder with no significant change in multiple small calculi measuring up to 6 mm in maximum diameter each. No gallbladder wall thickening or pericholecystic fluid. Ingested tablet fragments in the stomach and duodenum. Musculoskeletal: Previously noted multiple lytic lesions in the bony skeleton compatible with previously reported multiple myeloma. Stable T10 fracture deformity without acute fracture lines. An oblique fracture is again demonstrated in the T7 vertebral body extending through the pedicle on the right without additional posterior extension. IMPRESSION: 1. No interval findings suspicious for pneumonia. 2. Bilateral dependent atelectasis and lingular scarring. 3. Interval minimal  right pleural effusion and stable small left pleural effusion. 4. Stable bony changes of multiple myeloma with an acute pathological fracture of the T7 vertebral body and old compression fracture of the T10 vertebral body. 5. Stable changes of COPD with centrilobular emphysema. 6. Stable dense calcific coronary artery and aortic atherosclerosis. 7. Cholelithiasis without evidence of cholecystitis. 8. Stable probable posttraumatic or postinflammatory left pleural calcifications. Aortic Atherosclerosis (ICD10-I70.0) and Emphysema (ICD10-J43.9). Electronically Signed   By: Claudie Revering M.D.   On: 12/09/2021 16:17   CT Cervical Spine Wo Contrast  Result Date: 12/01/2021 CLINICAL DATA:  Trip and fall.  Neck trauma.  EXAM: CT CERVICAL SPINE WITHOUT CONTRAST TECHNIQUE: Multidetector CT imaging of the cervical spine was performed without intravenous contrast. Multiplanar CT image reconstructions were also generated. RADIATION DOSE REDUCTION: This exam was performed according to the departmental dose-optimization program which includes automated exposure control, adjustment of the mA and/or kV according to patient size and/or use of iterative reconstruction technique. COMPARISON:  CT cervical spine 11/07/2020 FINDINGS: Alignment: Straightening of normal lordosis. No traumatic subluxation. Skull base and vertebrae: No acute fracture. Intact dens and skull base. C1-C2 degenerative change. Bridging anterior as well as posterior osteophytes with bony ankylosis. No osteophyte fracture. The bones are under mineralized with heterogeneous marrow, no bony destructive process. Soft tissues and spinal canal: No prevertebral fluid or swelling. No visible canal hematoma. Disc levels: Diffuse disc space narrowing with ossification of scattered disc spaces, unchanged. Flowing osteophytes anterior and posteriorly with bony ankylosis. Multilevel neural foraminal narrowing without high-grade canal stenosis. Upper chest: Chronic left pleuroparenchymal thickening. No acute findings. Other: Advanced carotid calcifications. IMPRESSION: 1. No acute fracture or traumatic subluxation of the cervical spine. 2. Unchanged appearance of diffuse degenerative disc disease and flowing anterior and posterior osteophytes, typical of ankylosis. Electronically Signed   By: Keith Rake M.D.   On: 12/01/2021 18:45   MR LUMBAR SPINE WO CONTRAST  Result Date: 12/12/2021 CLINICAL DATA:  History of epidural steroid injections in lumbar spine, bacteremia, concern for osteomyelitis. EXAM: MRI LUMBAR SPINE WITHOUT CONTRAST TECHNIQUE: Multiplanar, multisequence MR imaging of the lumbar spine was performed. No intravenous contrast was administered. COMPARISON:   04/06/2019 FINDINGS: Segmentation:  Standard. Alignment:  Levocurvature of the lumbar spine. Vertebrae: No acute fracture or suspicious osseous lesion. No evidence of discitis or osteomyelitis. Unchanged degenerative changes at the anterior superior endplate of L5. Vertebral body endplates are otherwise intact.Redemonstrated cystic lesion in the left iliac bone, which appears unchanged compared to 2020. Conus medullaris and cauda equina: Conus extends to the T12 level. Conus and cauda equina appear normal. No epidural collection. Paraspinal and other soft tissues: Negative. Disc levels: L1-L2: No significant disc bulge. No spinal canal stenosis or neural foraminal narrowing. L2-L3: Mild disc bulge. Mild facet arthropathy. Narrowing of the lateral recesses. No spinal canal stenosis or neural foraminal narrowing. L3-L4: Mild disc bulge. Moderate right and mild left facet arthropathy. Narrowing of the lateral recesses. No spinal canal stenosis or neural foraminal narrowing. L4-L5: Mild disc bulge. Severe facet arthropathy. Effacement of the lateral recesses. Moderate spinal canal stenosis, which appears similar to the prior exam. Unchanged moderate bilateral neural foraminal narrowing. L5-S1: No significant disc bulge. Moderate right and mild left facet arthropathy. No spinal canal stenosis or neural foraminal narrowing. IMPRESSION: 1. No acute osseous abnormality. No evidence of discitis osteomyelitis. 2. L4-L5 moderate spinal canal stenosis and moderate bilateral neural foraminal narrowing, unchanged. Effacement of the lateral recesses at this level likely compresses the descending L5 nerve  roots. 3. Narrowing of the lateral recesses at L2-L3 and L3-L4 could affect the descending L3 and L4 nerve roots, respectively. Electronically Signed   By: Merilyn Baba M.D.   On: 12/12/2021 03:16   US RENAL  Result Date: 12/11/2021 CLINICAL DATA:  Acute kidney injury EXAM: RENAL / URINARY TRACT ULTRASOUND COMPLETE COMPARISON:   None. FINDINGS: Right Kidney: Renal measurements: 10.4 x 4.6 x 5.4 cm = volume: 135.2 mL. Echogenicity within normal limits. No mass or hydronephrosis visualized. Left Kidney: Renal measurements: 9.4 x 5 x 4 cm = volume: 99.1 mL. Echogenicity within normal limits. No mass or hydronephrosis visualized. Bladder: Appears normal for degree of bladder distention. Suboptimal evaluation due to bandage per technologist. Other: Ascites. IMPRESSION: No hydronephrosis. Ascites. Electronically Signed   By: Macy Mis M.D.   On: 12/11/2021 16:54   DG Chest Port 1 View  Result Date: 12/11/2021 CLINICAL DATA:  Aspiration pneumonia EXAM: PORTABLE CHEST 1 VIEW COMPARISON:  CT chest 12/09/2021 FINDINGS: Bilateral patchy interstitial and alveolar airspace opacities. No pleural effusion or pneumothorax. Stable cardiomegaly. Prior CABG. No acute osseous abnormality. IMPRESSION: 1. Bilateral patchy interstitial and alveolar airspace opacities concerning for multilobar pneumonia versus pulmonary edema. Electronically Signed   By: Kathreen Devoid M.D.   On: 12/11/2021 18:55   DG Chest Port 1 View  Result Date: 12/11/2021 CLINICAL DATA:  Shortness of breath EXAM: PORTABLE CHEST 1 VIEW COMPARISON:  Previous studies including the examination of 12/09/2021 FINDINGS: Transverse diameter of heart is increased. Central pulmonary vessels are prominent. Breathing motion limits evaluation of lung fields. There are no signs of alveolar pulmonary edema. There is asymmetric pleural density in the lateral aspect of left apex with no significant change. There is some crowding of markings in the lower lung fields. No new focal pulmonary consolidation is seen. There is blunting of lateral CP angles, more so on the left side. There is no pneumothorax. Multiple old fractures are seen in the left ribs. There is evidence of previous coronary bypass surgery. IMPRESSION: Cardiomegaly. There are no signs of alveolar pulmonary edema or new focal pulmonary  consolidation. Small bilateral pleural effusions, more so on the left side. Electronically Signed   By: Elmer Picker M.D.   On: 12/11/2021 15:27   DG Chest Port 1 View  Result Date: 12/09/2021 CLINICAL DATA:  Shortness of breath. History of congestive heart failure, hypertension and diabetes. Questionable sepsis-evaluate for abnormality. EXAM: PORTABLE CHEST 1 VIEW COMPARISON:  Radiographs 12/01/2021 and 09/07/2021.  CT 11/07/2020. FINDINGS: 1058 hours. The heart size and mediastinal contours are stable status post median sternotomy and CABG. There is aortic atherosclerosis. There is chronic lung disease with asymmetric pleural thickening and parenchymal scarring on the left. Compared with the most recent radiographs, there are increased interstitial and ground-glass opacities throughout both lungs which could reflect superimposed mild edema. No confluent airspace opacity, pneumothorax or significant pleural effusion identified. Old rib fractures are present on the left. There is advanced arthropathy at both shoulders. IMPRESSION: Increased interstitial and ground-glass opacities throughout both lungs compared with most recent radiographs, suggesting possible mild edema. Underlying chronic pleuroparenchymal scarring as described. Electronically Signed   By: Richardean Sale M.D.   On: 12/09/2021 11:11   ECHOCARDIOGRAM COMPLETE  Result Date: 12/12/2021    ECHOCARDIOGRAM REPORT   Patient Name:   Luis [name]. Date of Exam: 12/12/2021 Medical Rec #:  [id number]        Height:       67.0 in Accession #:  [id number]       Weight:       181.4 lb Date of Birth:  12-Dec-1945       BSA:          1.940 m Patient Age:    21 years         BP:           98/63 mmHg Patient Gender: M                HR:           62 bpm. Exam Location:  [facility] Procedure: 2D Echo Indications:     Bacteremia R78.81  History:         Patient has prior history of Echocardiogram examinations, most                  recent 11/10/2020.   Sonographer:     Kathlen Brunswick RDCS Referring Phys:  [place] Diagnosing Phys: [place]  1. Left ventricular ejection fraction, by estimation, is 60 to 65%. The left ventricle has normal function. The left ventricle has no regional wall motion abnormalities. Left ventricular diastolic parameters are indeterminate. There is the interventricular septum is flattened in systole, consistent with right ventricular pressure overload.  2. Right ventricular systolic function is severely reduced. The right ventricular size is severely enlarged. There is severely elevated pulmonary artery systolic pressure. The estimated right ventricular systolic pressure is 44.9 mmHg.  3. Right atrial size was severely dilated.  4. The mitral valve is degenerative. Mild to moderate mitral valve regurgitation. No evidence of mitral stenosis.  5. Tricuspid valve regurgitation is severe.  6. The aortic valve is normal in structure. Aortic valve regurgitation is not visualized. No aortic stenosis is present.  7. The inferior vena cava is normal in size with greater than 50% respiratory variability, suggesting right atrial pressure of 3 mmHg.  8. Cannot exclude a small PFO. FINDINGS  Left Ventricle: Left ventricular ejection fraction, by estimation, is 60 to 65%. The left ventricle has normal function. The left ventricle has no regional wall motion abnormalities. The left ventricular internal cavity size was small. There is no left ventricular hypertrophy. The interventricular septum is flattened in systole, consistent with right ventricular pressure overload. Left ventricular diastolic parameters are indeterminate. Right Ventricle: The right ventricular size is severely enlarged. Right vetricular wall thickness was not well visualized. Right ventricular systolic function is severely reduced. There is severely elevated pulmonary artery systolic pressure. The tricuspid regurgitant velocity is 4.50 m/s, and with an  assumed right atrial pressure of 10 mmHg, the estimated right ventricular systolic pressure is 20.1 mmHg. Left Atrium: Left atrial size was normal in size. Right Atrium: Right atrial size was severely dilated. Pericardium: There is no evidence of pericardial effusion. Mitral Valve: The mitral valve is degenerative in appearance. Mild to moderate mitral valve regurgitation. No evidence of mitral valve stenosis. Tricuspid Valve: The tricuspid valve is normal in structure. Tricuspid valve regurgitation is severe. No evidence of tricuspid stenosis. Aortic Valve: The aortic valve is normal in structure. Aortic valve regurgitation is not visualized. No aortic stenosis is present. Aortic valve peak gradient measures 5.5 mmHg. Pulmonic Valve: The pulmonic valve was grossly normal. Pulmonic valve regurgitation is not visualized. No evidence of pulmonic stenosis. Aorta: The aortic root is normal in size and structure. Venous: The inferior vena cava is normal in size with greater than 50% respiratory variability, suggesting right atrial pressure of 3 mmHg. IAS/Shunts:  Cannot exclude a small PFO.  LEFT VENTRICLE PLAX 2D LVIDd:         4.90 cm     Diastology LVIDs:         3.50 cm     LV e' medial:    4.24 cm/s LV PW:         1.20 cm     LV E/e' medial:  9.8 LV IVS:        1.20 cm     LV e' lateral:   5.00 cm/s LVOT diam:     2.10 cm     LV E/e' lateral: 8.3 LV SV:         38 LV SV Index:   20 LVOT Area:     3.46 cm  LV Volumes (MOD) LV vol d, MOD A4C: 37.4 ml LV vol s, MOD A4C: 16.5 ml LV SV MOD A4C:     37.4 ml RIGHT VENTRICLE RV Basal diam:  5.80 cm RV S prime:     9.46 cm/s TAPSE (M-mode): 1.9 cm LEFT ATRIUM             Index        RIGHT ATRIUM           Index LA diam:        3.50 cm 1.80 cm/m   RA Area:     18.40 cm LA Vol (A2C):   30.8 ml 15.87 ml/m  RA Volume:   53.10 ml  27.37 ml/m LA Vol (A4C):   18.9 ml 9.74 ml/m LA Biplane Vol: 25.3 ml 13.04 ml/m  AORTIC VALVE                 PULMONIC VALVE AV Area (Vmax):  1.57 cm     PV Vmax:          0.90 m/s AV Vmax:        117.00 cm/s  PV Peak grad:     3.3 mmHg AV Peak Grad:   5.5 mmHg     PR End Diast Vel: 16.00 msec LVOT Vmax:      52.90 cm/s LVOT Vmean:     30.600 cm/s LVOT VTI:       0.110 m  AORTA Ao Root diam: 3.60 cm Ao Asc diam:  3.10 cm MITRAL VALVE               TRICUSPID VALVE MV Area (PHT): 3.34 cm    TV Peak grad:   64.8 mmHg MV Decel Time: 227 msec    TV Vmax:        4.03 m/s MV E velocity: 41.60 cm/s  TR Peak grad:   81.0 mmHg MV A velocity: 63.00 cm/s  TR Vmax:        450.00 cm/s MV E/A ratio:  0.66                            SHUNTS                            Systemic VTI:  0.11 m                            Systemic Diam: 2.10 cm Donnelly Angelica Electronically signed by Donnelly Angelica Signature Date/Time: 12/12/2021/3:04:08 PM    Final    DG ESOPHAGUS W DOUBLE CM (HD)  Result Date: 12/11/2021 CLINICAL DATA:  A 76 year old male presents for evaluation of swallowing and dysphagia. EXAM: ESOPHOGRAM/BARIUM SWALLOW TECHNIQUE: Single contrast examination was performed using  thin barium. FLUOROSCOPY: Fluoroscopy time: 2 minutes 48 seconds Fluoroscopy dose: 50.7 mGy COMPARISON:  Chest CT from December 09, 2021. FINDINGS: The exam was markedly limited due to the patient's debilitated state and difficulty in positioning the patient. Exam was performed in slightly LPO just off AP and RPO positioning. The esophagus was found to be markedly patulous above the GE junction where there was tortuosity and extensive tertiary contractions throughout the exam. Long segment narrowing of the GE junction was noted on multiple images though this could not be challenged with a large bolus and the presence of dysmotility also limited assessment of this area. This appears to be smooth and long segment. Along the distal medial esophagus there are small pulsion diverticula with distortion of the medial esophageal wall. Contrast did pass into the stomach aided by the administration of some water.  Additional images could not be obtained due to coughing that the patient experienced though there was no visible "gross" aspiration.There may be some laryngeal penetration though this area was difficult to evaluate. In the RPO position marked "corkscrewing" and tortuosity of the proximal esophagus was noted with the patient in kyphotic position at baseline. IMPRESSION: 1. Markedly patulous esophagus above the GE junction with extensive tertiary contractions throughout the exam. "Corkscrewing" of the esophagus as well was noted with limited peristaltic activity distally though there was emptying of the esophagus despite segmental narrowing of the distal esophagus into the stomach. Findings could relate to distal esophageal stricture but could not be well assessed due to the debilitated nature of the patient. Marked dysmotility and the long segment narrowing with beaked appearance raising the question achalasia. Esophageal manometry and endoscopy may be helpful to narrow the differential. 2. Given difficulty with swallowing a dedicated swallow function may also be helpful. Electronically Signed   By: Zetta Bills M.D.   On: 12/11/2021 16:04      ASSESSMENT/PLAN   Acute on chronic hypoxemic respiratory failure    -patient initially with leukocytosis and sepsis presumably due to cellulitis     -patient has multifactorial causes including severe emphysema, bilateral effusions, chronic lung disease with rounded atelectasis and pleural calcification, pulmonary infiltrates suggestive of ongoing pneumonia, CHF exacerbation , COPD exacerbation, pulmonary hypertension and extreme kyphophosis with restrictive physiology not to mention physical deconditioning and unmentioned comorbid conditions.     - He is currently with renal failure which appears to be acute on chronic and is likely to hinder diuresis necessary for effusions.    -he has staph aureus bacteremia -will hold propranolol to potentiate BP and allow  better diuresis for now  Acute on chronic renal failure KDIGO 3  Dc nonessential nephrotonix    - patient with UTI but only 1K colonies      - dc non essential nephrotoxins - will hold vitamin C, protonix, vitamin D, calcium until acutely ill status is improved.      Bilateral pleural effusions  Diagnositic thoracentesis and therapeutic thoracentesis -aldactone 50 mg and lasix IV   Sepsis with staph bacteremia  Present on admission    - on ancef and flagyl    - ID team on case appreciate input.    Moderate to severe Pulmonary hypertension     -patient may have had PE although his symptoms may be explained by all of the above medical problems   -  we will order VQ scan for now to rule out any large defects   - I suspect PH is group 2 due to heart failure with valvular heart disease which mainly focuses on optimization of CHF status  Acute on chronic diastolic CHF  BNP >9390 -TTE reviewed -agree with diuresis for now -monitor telemetry     Extreme kyphosis and physical deconditioning    Patient is at high risk of chronic lung disease due to kyphosis and sedentary lifestyle , he is however DNR and is with advanced age.  There is consultation placed already for palliative care which seems reasonable.      Thank you for allowing me to participate in the care of this patient.    Patient/Family are satisfied with care plan and all questions have been answered.  This document was prepared using Dragon voice recognition software and may include unintentional dictation errors.     Ottie Glazier, M.D.  Division of [facility]

## 2021-12-14 ENCOUNTER — Inpatient Hospital Stay: Payer: Medicare Other

## 2021-12-14 ENCOUNTER — Encounter: Payer: Self-pay | Admitting: Internal Medicine

## 2021-12-14 DIAGNOSIS — Z7189 Other specified counseling: Secondary | ICD-10-CM

## 2021-12-14 DIAGNOSIS — A419 Sepsis, unspecified organism: Secondary | ICD-10-CM | POA: Diagnosis not present

## 2021-12-14 DIAGNOSIS — Z515 Encounter for palliative care: Secondary | ICD-10-CM

## 2021-12-14 LAB — CULTURE, BLOOD (ROUTINE X 2)
Culture: NO GROWTH
Special Requests: ADEQUATE

## 2021-12-14 LAB — BASIC METABOLIC PANEL
Anion gap: 6 (ref 5–15)
BUN: 47 mg/dL — ABNORMAL HIGH (ref 8–23)
CO2: 24 mmol/L (ref 22–32)
Calcium: 7.5 mg/dL — ABNORMAL LOW (ref 8.9–10.3)
Chloride: 101 mmol/L (ref 98–111)
Creatinine, Ser: 2.22 mg/dL — ABNORMAL HIGH (ref 0.61–1.24)
GFR, Estimated: 30 mL/min — ABNORMAL LOW (ref >60)
Glucose, Bld: 102 mg/dL — ABNORMAL HIGH (ref 70–99)
Potassium: 3.8 mmol/L (ref 3.5–5.1)
Sodium: 131 mmol/L — ABNORMAL LOW (ref 135–145)

## 2021-12-14 LAB — GLUCOSE, CAPILLARY
Glucose-Capillary: 113 mg/dL — ABNORMAL HIGH (ref 70–99)
Glucose-Capillary: 92 mg/dL (ref 70–99)
Glucose-Capillary: 187 mg/dL — ABNORMAL HIGH (ref 70–99)
Glucose-Capillary: 62 mg/dL — ABNORMAL LOW (ref 70–99)
Glucose-Capillary: 106 mg/dL — ABNORMAL HIGH (ref 70–99)
Glucose-Capillary: 57 mg/dL — ABNORMAL LOW (ref 70–99)
Glucose-Capillary: 67 mg/dL — ABNORMAL LOW (ref 70–99)
Glucose-Capillary: 125 mg/dL — ABNORMAL HIGH (ref 70–99)

## 2021-12-14 LAB — CBC
HCT: 33.2 % — ABNORMAL LOW (ref 39.0–52.0)
Hemoglobin: 11.2 g/dL — ABNORMAL LOW (ref 13.0–17.0)
MCH: 37.3 pg — ABNORMAL HIGH (ref 26.0–34.0)
MCHC: 33.7 g/dL (ref 30.0–36.0)
MCV: 110.7 fL — ABNORMAL HIGH (ref 80.0–100.0)
Platelets: 188 10*3/uL (ref 150–400)
RBC: 3 MIL/uL — ABNORMAL LOW (ref 4.22–5.81)
RDW: 17.2 % — ABNORMAL HIGH (ref 11.5–15.5)
WBC: 6.8 10*3/uL (ref 4.0–10.5)
nRBC: 0.3 % — ABNORMAL HIGH (ref 0.0–0.2)

## 2021-12-14 LAB — AEROBIC CULTURE W GRAM STAIN (SUPERFICIAL SPECIMEN)

## 2021-12-14 MED ORDER — FUROSEMIDE 40 MG PO TABS: 40.0000 mg | ORAL_TABLET | Freq: Every day | ORAL | Status: DC

## 2021-12-14 MED ORDER — DEXTROSE 50 % IV SOLN: 1.0000 | Freq: Once | INTRAVENOUS | Status: DC | PRN

## 2021-12-14 MED ORDER — TECHNETIUM TO 99M ALBUMIN AGGREGATED
4.0000 | Freq: Once | INTRAVENOUS | Status: AC | PRN
  Administered 2021-12-14: 4.09 via INTRAVENOUS

## 2021-12-14 NOTE — Progress Notes (Signed)
PROGRESS NOTE    Luis [name].  VOH:[id number] DOB: 04-24-1946 DOA: 12/09/2021 PCP: Tracie Harrier, MD  Outpatient Specialists: cardiology, dermatology, oncology    Brief Narrative:   From admission h and p Luis [name]. is a 76 y.o. male with medical history significant for coronary artery disease status post CABG, history of multiple myeloma, diabetes mellitus with stage III chronic kidney disease, hypertension, anxiety and depression who presents to the ER for evaluation of worsening shortness of breath from his baseline and weakness. Shortness of breath is mostly with exertion and is occasionally associated with a cough productive of clear to green phlegm.  He denies having any fever but has had chills. Per EMS patient's room air pulse oximetry was 90 to 91% and his blood pressure was in the 80s. He has bilateral lower extremity swelling and has noted increased redness involving his lower extremities. He denies having any headache, no dizziness, no lightheadedness, no abdominal pain, no nausea, no changes in his bowel habits, no urinary symptoms, no chest pain, no blood pressure no focal deficit. Sodium 135, potassium 2.3, chloride 97, bicarb 27, glucose 106, BUN 43, creatinine 2.08, calcium 7.6, alkaline phosphatase 63, albumin 2.1, AST 41, ALT 16, total protein 5.8, troponin 38, lactic acid 5.1 >> 4.3, PT 17.7, INR 1.5 Respiratory viral panel is negative Urine analysis is stable MRSA PCR was positive Chest x-ray reviewed by me shows increased additional groundglass opacities throughout both lungs suggesting possible mild edema.  Underlying chronic pleural-parenchymal scarring. Twelve-lead EKG shows atrial flutter with 2-1 block, PVCs and a right bundle branch block.   Assessment & Plan:   Principal Problem:   Sepsis ([place]) Active Problems:   Spinal stenosis   Type 2 diabetes mellitus without complication, without long-term current use of insulin (HCC)   CAD (coronary  artery disease) of artery bypass graft   Depression with anxiety   Atrial fibrillation with RVR (HCC)   Cellulitis   Hypokalemia   Malnutrition of moderate degree   # lower extremity cellulitis # Sepsis # Bacteremia Sepsis by tachycardia, lactic acidosis. S/p 1 liter in the ED. 1/4 BCs growing s aureus. ID following. Lower extremity erythema improving. Culture pan-sensitive. Repeat blood cultures ngtd. MRI of lumbar spine showed no signs infection. - cont cefazolin - TTE w/o signs endocarditis. May need TEE  # Acute hypoxic respiratory failure Likely multifactorial. CT yesterday shows worsening infiltrates, possible aspiration. V/Q scan on 2/6 does not show PE. Has pleural effusions but per IR they are too small to tap - added flagyl to cefazolin - cont o2 - cont diuresis  # Goals of care - palliative medicine consult placed today. For now patient wants to "treat the treatable, nothing heroic"  # Acute kidney injury Cr 2s from baseline around 1.2. treated w/ ivf, cr stable around 2. Renal u/s w/o hydro - will cautiously continue diuresis  # Pulmonary hypertension Severe, on TTE 2/4. Night team treated with albumin/lasix. RA filling pressures low. Has mod pHTN. Likely some combination of types 2 and 3 pHTN. Pulmonology is following  # Pleural effusions Per IR too small to tap  # Cirrhosis # Ascites CT of chest shows cirrhotic liver, ascites. This appears to be a new dx. No SBP symptoms. Does have history heavy drinking. u/s liver w/ doppler ordered, consistent w/ cirrhosis, no clot or suspicious masses - as this is new will consult IR for diagnostic/therapeutic paracentesis - lasix 40, spiro 50  #  Dysphagia Esophagram on 2/3  shows "corkscrewing" of the esophagus, possible stricture, marked dysmotility. - SLP notes sig aspiration risk - will need close GI f/u at d/c  # Chronic diarrhea Established w/ GI last month. GI pathogen panel here is negative.  # Hypokalemia #  Hypomagenesemia Resolved - monitor  # dCHF # CAD s/p bypass graft No signs/symptoms ACS. Bnp markedly elevated on presentation. Patient reports lower extremity swelling is mild and improved from baseline so initially treated with fluids. TTE with mild/mod mitral regurg, severely reduced right ventricular function - lasix, spiro  # A fib with rvr RVR resolved. Not angicoagulated 2/2 frequent falls - cont home propranolol  # HTN Here bp wnl - hold home losartan, cont propranolol   # Multiple myeloma - followed by onc, not currently treated  # T7 compression fracture 2/2 MM.  - pain control     DVT prophylaxis: heparin Code Status: DNR, confirmed w/ son and patient 2/3 Family Communication: son Shanon Brow updated telephonically 2/6  Level of care: Progressive Status is: Inpatient Remains inpatient appropriate because: severity of illness    Consultants:  ID, pulm, palliative  Procedures: none  Antimicrobials:  Vanc/cefepime>cefazolin  Flagyl added 2/5   Subjective: Says breathing and anxiety improved, no pain, working with PT  Objective: Vitals:   12/14/21 0735 12/14/21 0827 12/14/21 1115 12/14/21 1547  BP: (!) 99/59 102/60 (!) 97/58 107/69  Pulse: (!) 55 61 (!) 51 (!) 48  Resp: '20  20 20  ' Temp:   98.3 F (36.8 C) 98.2 F (36.8 C)  TempSrc:      SpO2: 97%  92% 96%  Weight:        Intake/Output Summary (Last 24 hours) at 12/14/2021 1611 Last data filed at 12/13/2021 1848 Gross per 24 hour  Intake 120 ml  Output --  Net 120 ml   Filed Weights   12/12/21 0500 12/13/21 0500 12/14/21 0400  Weight: 82.3 kg 85.3 kg 87.2 kg    Examination:  General exam: Appears calm and comfortable  Respiratory system: rales, exp wheeze Cardiovascular system: S1 & S2 heard, irreg irreg, soft systolic murmur No JVD, murmurs, rubs, gallops or clicks.  Gastrointestinal system: Abdomen is nondistended, soft and nontender. No organomegaly or masses felt. Normal bowel sounds  heard. Central nervous system: Alert and oriented. No focal neurological deficits. Extremities: Symmetric 5 x 5 power. Skin: inerval mild impreovement in lower extremity erythema. Scabbs present. Ulcers on scalp bandaged Psychiatry: Judgement and insight appear normal. Mood & affect appropriate.     Data Reviewed: I have personally reviewed following labs and imaging studies  CBC: Recent Labs  Lab 12/09/21 1056 12/10/21 0621 12/11/21 0950 12/12/21 0414 12/13/21 0544 12/14/21 0409  WBC 10.8* 8.1 8.9 6.7 5.8 6.8  NEUTROABS 9.3*  --   --   --   --   --   HGB 11.5* 9.8* 11.4* 10.8* 10.3* 11.2*  HCT 33.1* 27.9* 33.2* 32.2* 29.9* 33.2*  MCV 110.0* 109.0* 109.2* 111.0* 108.7* 110.7*  PLT 149* 129* 156 172 157 938   Basic Metabolic Panel: Recent Labs  Lab 12/09/21 1305 12/10/21 0621 12/10/21 2125 12/11/21 0950 12/12/21 0414 12/13/21 0544 12/14/21 0409  NA 135 132*  --  131* 133* 133* 131*  K 2.3* 2.9*  --  4.4 4.3 3.5 3.8  CL 97* 96*  --  99 101 103 101  CO2 27 27  --  '23 23 23 24  ' GLUCOSE 106* 117*  --  124* 101* 115* 102*  BUN 43* 51*  --  50* 45* 45* 47*  CREATININE 2.08* 2.16*  --  2.07* 1.97* 2.12* 2.22*  CALCIUM 7.6* 7.4*  --  7.3* 7.3* 7.4* 7.5*  MG 1.4* 2.5* 2.3 2.3 2.2  --   --    GFR: Estimated Creatinine Clearance: 29.4 mL/min (A) (by C-G formula based on SCr of 2.22 mg/dL (H)). Liver Function Tests: Recent Labs  Lab 12/09/21 1305  AST 41  ALT 16  ALKPHOS 63  BILITOT 1.8*  PROT 5.8*  ALBUMIN 2.1*   No results for input(s): LIPASE, AMYLASE in the last 168 hours. No results for input(s): AMMONIA in the last 168 hours. Coagulation Profile: Recent Labs  Lab 12/09/21 1208 12/10/21 0621  INR 1.5* 1.5*   Cardiac Enzymes: No results for input(s): CKTOTAL, CKMB, CKMBINDEX, TROPONINI in the last 168 hours. BNP (last 3 results) No results for input(s): PROBNP in the last 8760 hours. HbA1C: No results for input(s): HGBA1C in the last 72  hours.  CBG: Recent Labs  Lab 12/13/21 2147 12/14/21 0734 12/14/21 0826 12/14/21 1114 12/14/21 1555  GLUCAP 80 113* 92 187* 62*   Lipid Profile: No results for input(s): CHOL, HDL, LDLCALC, TRIG, CHOLHDL, LDLDIRECT in the last 72 hours. Thyroid Function Tests: No results for input(s): TSH, T4TOTAL, FREET4, T3FREE, THYROIDAB in the last 72 hours. Anemia Panel: No results for input(s): VITAMINB12, FOLATE, FERRITIN, TIBC, IRON, RETICCTPCT in the last 72 hours. Urine analysis:    Component Value Date/Time   COLORURINE YELLOW 12/09/2021 1119   APPEARANCEUR CLEAR (A) 12/09/2021 1119   APPEARANCEUR Clear 08/07/2013 0102   LABSPEC 1.015 12/09/2021 1119   LABSPEC 1.023 08/07/2013 0102   PHURINE 5.0 12/09/2021 1119   GLUCOSEU NEGATIVE 12/09/2021 1119   GLUCOSEU 50 mg/dL 08/07/2013 0102   HGBUR NEGATIVE 12/09/2021 1119   BILIRUBINUR NEGATIVE 12/09/2021 1119   BILIRUBINUR Negative 08/07/2013 0102   KETONESUR TRACE (A) 12/09/2021 1119   PROTEINUR TRACE (A) 12/09/2021 1119   NITRITE NEGATIVE 12/09/2021 1119   LEUKOCYTESUR NEGATIVE 12/09/2021 1119   LEUKOCYTESUR Negative 08/07/2013 0102   Sepsis Labs: '@LABRCNTIP' (procalcitonin:4,lacticidven:4)  ) Recent Results (from the past 240 hour(s))  Blood Culture (routine x 2)     Status: Abnormal   Collection Time: 12/09/21 10:57 AM   Specimen: BLOOD  Result Value Ref Range Status   Specimen Description   Final    BLOOD LEFT WRIST Performed at [facility], [address] [place], [place] [address]    Special Requests   Final    BOTTLES DRAWN AEROBIC AND ANAEROBIC Blood Culture adequate volume Performed at [facility], [address] [place], [place] [address]    Culture  Setup Time   Final    GRAM POSITIVE COCCI AEROBIC BOTTLE ONLY CRITICAL RESULT CALLED TO, READ BACK BY AND VERIFIED WITH: JASON ROBBINS @ 0149 ON 12/10/21.Marland KitchenMarland KitchenTKR Performed at [facility], [place] [address], [place], [place] [address]     Culture STAPHYLOCOCCUS AUREUS (A)  Final   Report Status 12/12/2021 FINAL  Final   Organism ID, Bacteria STAPHYLOCOCCUS AUREUS  Final      Susceptibility   Staphylococcus aureus - MIC*    CIPROFLOXACIN <=0.5 SENSITIVE Sensitive     ERYTHROMYCIN <=0.25 SENSITIVE Sensitive     GENTAMICIN <=0.5 SENSITIVE Sensitive     OXACILLIN <=0.25 SENSITIVE Sensitive     TETRACYCLINE <=1 SENSITIVE Sensitive     VANCOMYCIN <=0.5 SENSITIVE Sensitive     TRIMETH/SULFA <=10 SENSITIVE Sensitive     CLINDAMYCIN <=0.25 SENSITIVE Sensitive  RIFAMPIN <=0.5 SENSITIVE Sensitive     Inducible Clindamycin NEGATIVE Sensitive     * STAPHYLOCOCCUS AUREUS  Blood Culture ID Panel (Reflexed)     Status: Abnormal   Collection Time: 12/09/21 10:57 AM  Result Value Ref Range Status   Enterococcus faecalis NOT DETECTED NOT DETECTED Final   Enterococcus Faecium NOT DETECTED NOT DETECTED Final   Listeria monocytogenes NOT DETECTED NOT DETECTED Final   Staphylococcus species DETECTED (A) NOT DETECTED Final    Comment: CRITICAL RESULT CALLED TO, READ BACK BY AND VERIFIED WITH: JASON ROBBINS @ 0149 ON 12/10/21.Marland KitchenMarland KitchenTKR    Staphylococcus aureus (BCID) DETECTED (A) NOT DETECTED Final    Comment: CRITICAL RESULT CALLED TO, READ BACK BY AND VERIFIED WITH: JASON ROBBINS @ 0149 ON 12/10/21.Marland KitchenMarland KitchenTKR    Staphylococcus epidermidis NOT DETECTED NOT DETECTED Final   Staphylococcus lugdunensis NOT DETECTED NOT DETECTED Final   Streptococcus species NOT DETECTED NOT DETECTED Final   Streptococcus agalactiae NOT DETECTED NOT DETECTED Final   Streptococcus pneumoniae NOT DETECTED NOT DETECTED Final   Streptococcus pyogenes NOT DETECTED NOT DETECTED Final   A.calcoaceticus-baumannii NOT DETECTED NOT DETECTED Final   Bacteroides fragilis NOT DETECTED NOT DETECTED Final   Enterobacterales NOT DETECTED NOT DETECTED Final   Enterobacter cloacae complex NOT DETECTED NOT DETECTED Final   Escherichia coli NOT DETECTED NOT DETECTED Final    Klebsiella aerogenes NOT DETECTED NOT DETECTED Final   Klebsiella oxytoca NOT DETECTED NOT DETECTED Final   Klebsiella pneumoniae NOT DETECTED NOT DETECTED Final   Proteus species NOT DETECTED NOT DETECTED Final   Salmonella species NOT DETECTED NOT DETECTED Final   Serratia marcescens NOT DETECTED NOT DETECTED Final   Haemophilus influenzae NOT DETECTED NOT DETECTED Final   Neisseria meningitidis NOT DETECTED NOT DETECTED Final   Pseudomonas aeruginosa NOT DETECTED NOT DETECTED Final   Stenotrophomonas maltophilia NOT DETECTED NOT DETECTED Final   Candida albicans NOT DETECTED NOT DETECTED Final   Candida auris NOT DETECTED NOT DETECTED Final   Candida glabrata NOT DETECTED NOT DETECTED Final   Candida krusei NOT DETECTED NOT DETECTED Final   Candida parapsilosis NOT DETECTED NOT DETECTED Final   Candida tropicalis NOT DETECTED NOT DETECTED Final   Cryptococcus neoformans/gattii NOT DETECTED NOT DETECTED Final   Meth resistant mecA/C and MREJ NOT DETECTED NOT DETECTED Final    Comment: Performed at [facility], [address] [place], [place] [address]  Blood Culture (routine x 2)     Status: None   Collection Time: 12/09/21 10:59 AM   Specimen: BLOOD  Result Value Ref Range Status   Specimen Description BLOOD RIGHT [facility]  Final   Special Requests   Final    BOTTLES DRAWN AEROBIC AND ANAEROBIC Blood Culture adequate volume   Culture   Final    NO GROWTH 5 DAYS Performed at [facility], [address] [place], [place] [address]    Report Status 12/14/2021 FINAL  Final  Urine Culture     Status: Abnormal   Collection Time: 12/09/21 11:19 AM   Specimen: In/Out Cath Urine  Result Value Ref Range Status   Specimen Description   Final    IN/OUT CATH URINE Performed at [facility], [address], [place], [place] [address]    Special Requests   Final    NONE Performed at [facility], [address], [place], [place] [address]    Culture  1,000 COLONIES/mL ENTEROCOCCUS FAECALIS (A)  Final   Report Status 12/12/2021 FINAL  Final  Organism ID, Bacteria ENTEROCOCCUS FAECALIS (A)  Final      Susceptibility   Enterococcus faecalis - MIC*    AMPICILLIN <=2 SENSITIVE Sensitive     NITROFURANTOIN <=16 SENSITIVE Sensitive     VANCOMYCIN 1 SENSITIVE Sensitive     * 1,000 COLONIES/mL ENTEROCOCCUS FAECALIS  Resp Panel by RT-PCR (Flu A&B, Covid) Nasopharyngeal Swab     Status: None   Collection Time: 12/09/21 11:19 AM   Specimen: Nasopharyngeal Swab; Nasopharyngeal(NP) swabs in vial transport medium  Result Value Ref Range Status   SARS Coronavirus 2 by RT PCR NEGATIVE NEGATIVE Final    Comment: (NOTE) SARS-CoV-2 target nucleic acids are NOT DETECTED.  The SARS-CoV-2 RNA is generally detectable in upper respiratory specimens during the acute phase of infection. The lowest concentration of SARS-CoV-2 viral copies this assay can detect is 138 copies/mL. A negative result does not preclude SARS-Cov-2 infection and should not be used as the sole basis for treatment or other patient management decisions. A negative result may occur with  improper specimen collection/handling, submission of specimen other than nasopharyngeal swab, presence of viral mutation(s) within the areas targeted by this assay, and inadequate number of viral copies(<138 copies/mL). A negative result must be combined with clinical observations, patient history, and epidemiological information. The expected result is Negative.  Fact Sheet for Patients:  EntrepreneurPulse.com.au  Fact Sheet for Healthcare Providers:  IncredibleEmployment.be  This test is no t yet approved or cleared by the [place] FDA and  has been authorized for detection and/or diagnosis of SARS-CoV-2 by FDA under an Emergency Use Authorization (EUA). This EUA will remain  in effect (meaning this test can be used) for the duration of the COVID-19  declaration under Section 564(b)(1) of the Act, 21 U.S.C.section 360bbb-3(b)(1), unless the authorization is terminated  or revoked sooner.       Influenza A by PCR NEGATIVE NEGATIVE Final   Influenza B by PCR NEGATIVE NEGATIVE Final    Comment: (NOTE) The Xpert Xpress SARS-CoV-2/FLU/RSV plus assay is intended as an aid in the diagnosis of influenza from Nasopharyngeal swab specimens and should not be used as a sole basis for treatment. Nasal washings and aspirates are unacceptable for Xpert Xpress SARS-CoV-2/FLU/RSV testing.  Fact Sheet for Patients: EntrepreneurPulse.com.au  Fact Sheet for Healthcare Providers: IncredibleEmployment.be  This test is not yet approved or cleared by the [place] FDA and has been authorized for detection and/or diagnosis of SARS-CoV-2 by FDA under an Emergency Use Authorization (EUA). This EUA will remain in effect (meaning this test can be used) for the duration of the COVID-19 declaration under Section 564(b)(1) of the Act, 21 U.S.C. section 360bbb-3(b)(1), unless the authorization is terminated or revoked.  Performed at [facility], [address] [place], [place] [address]   MRSA Next Gen by PCR, Nasal     Status: Abnormal   Collection Time: 12/09/21  1:24 PM   Specimen: Nasal Mucosa; Nasal Swab  Result Value Ref Range Status   MRSA by PCR Next Gen DETECTED (A) NOT DETECTED Final    Comment: RESULT CALLED TO, READ BACK BY AND VERIFIED WITH: JON COOK 1525 12/09/21 MU (NOTE) The GeneXpert MRSA Assay (FDA approved for NASAL specimens only), is one component of a comprehensive MRSA colonization surveillance program. It is not intended to diagnose MRSA infection nor to guide or monitor treatment for MRSA infections. Test performance is not FDA approved in patients less than 34 years old. Performed at [facility], [place],  [place], [place] [address]   Aerobic Culture w Gram  Stain (superficial specimen)     Status: None   Collection Time: 12/10/21  5:31 PM   Specimen: SCALP; Wound  Result Value Ref Range Status   Specimen Description   Final    SCALP Performed at [facility], [address] [place], [place] [address]    Special Requests   Final    NONE Performed at [facility], [address] [place], [place] [address]    Gram Stain   Final    FEW WBC PRESENT, PREDOMINANTLY MONONUCLEAR MODERATE GRAM POSITIVE COCCI Performed at [facility], [place] [address] [place], [place] [address]    Culture ABUNDANT STAPHYLOCOCCUS AUREUS  Final   Report Status 12/14/2021 FINAL  Final   Organism ID, Bacteria STAPHYLOCOCCUS AUREUS  Final      Susceptibility   Staphylococcus aureus - MIC*    CIPROFLOXACIN <=0.5 SENSITIVE Sensitive     ERYTHROMYCIN <=0.25 SENSITIVE Sensitive     GENTAMICIN <=0.5 SENSITIVE Sensitive     OXACILLIN 0.5 SENSITIVE Sensitive     TETRACYCLINE <=1 SENSITIVE Sensitive     VANCOMYCIN <=0.5 SENSITIVE Sensitive     TRIMETH/SULFA <=10 SENSITIVE Sensitive     CLINDAMYCIN <=0.25 SENSITIVE Sensitive     RIFAMPIN <=0.5 SENSITIVE Sensitive     Inducible Clindamycin NEGATIVE Sensitive     * ABUNDANT STAPHYLOCOCCUS AUREUS  CULTURE, BLOOD (ROUTINE X 2) w Reflex to ID Panel     Status: None (Preliminary result)   Collection Time: 12/11/21  6:59 AM   Specimen: BLOOD  Result Value Ref Range Status   Specimen Description BLOOD LEFT WRIST  Final   Special Requests   Final    BOTTLES DRAWN AEROBIC AND ANAEROBIC Blood Culture adequate volume   Culture   Final    NO GROWTH 3 DAYS Performed at [facility], [address] [place], [place] [address]    Report Status PENDING  Incomplete  CULTURE, BLOOD (ROUTINE X 2) w Reflex to ID Panel     Status: None (Preliminary result)   Collection Time: 12/11/21  7:16 AM   Specimen: BLOOD  Result Value Ref Range Status   Specimen Description BLOOD LEFT ARM  Final   Special  Requests   Final    BOTTLES DRAWN AEROBIC AND ANAEROBIC Blood Culture adequate volume   Culture   Final    NO GROWTH 3 DAYS Performed at [facility], [address] [place], [place] [address]    Report Status PENDING  Incomplete  Gastrointestinal Panel by PCR , Stool     Status: None   Collection Time: 12/13/21 12:15 PM   Specimen: Stool  Result Value Ref Range Status   Campylobacter species NOT DETECTED NOT DETECTED Final   Plesimonas shigelloides NOT DETECTED NOT DETECTED Final   Salmonella species NOT DETECTED NOT DETECTED Final   Yersinia enterocolitica NOT DETECTED NOT DETECTED Final   Vibrio species NOT DETECTED NOT DETECTED Final   Vibrio cholerae NOT DETECTED NOT DETECTED Final   Enteroaggregative E coli (EAEC) NOT DETECTED NOT DETECTED Final   Enteropathogenic E coli (EPEC) NOT DETECTED NOT DETECTED Final   Enterotoxigenic E coli (ETEC) NOT DETECTED NOT DETECTED Final   Shiga like toxin producing E coli (STEC) NOT DETECTED NOT DETECTED Final   Shigella/Enteroinvasive E coli (EIEC) NOT DETECTED NOT DETECTED Final   Cryptosporidium NOT DETECTED NOT DETECTED Final   Cyclospora cayetanensis NOT DETECTED NOT DETECTED Final  Entamoeba histolytica NOT DETECTED NOT DETECTED Final   Giardia lamblia NOT DETECTED NOT DETECTED Final   Adenovirus F40/41 NOT DETECTED NOT DETECTED Final   Astrovirus NOT DETECTED NOT DETECTED Final   Norovirus GI/GII NOT DETECTED NOT DETECTED Final   Rotavirus A NOT DETECTED NOT DETECTED Final   Sapovirus (I, II, IV, and V) NOT DETECTED NOT DETECTED Final    Comment: Performed at [facility], [address], [place], [place] [address]         Radiology Studies: NM Pulmonary Perfusion  Result Date: 12/14/2021 CLINICAL DATA:  History of DVT, multiple falls, right pleural effusion EXAM: NUCLEAR MEDICINE PERFUSION LUNG SCAN TECHNIQUE: Perfusion images were obtained in multiple projections after intravenous injection of  radiopharmaceutical. Ventilation scans intentionally deferred if perfusion scan and chest x-ray adequate for interpretation during COVID 19 epidemic. RADIOPHARMACEUTICALS:  4.09 mCi Tc-70mMAA IV COMPARISON:  12/14/2021 FINDINGS: Planar images of the lungs demonstrates heterogeneous radiotracer uptake throughout the pulmonary parenchyma. There are no wedge-shaped perfusion defects to suggest pulmonary embolus. Heterogeneous decreased uptake in the upper lobes consistent with known emphysema. Heterogeneous areas of decreased uptake within the lower lobes consistent with atelectasis on recent CT and x-ray. IMPRESSION: 1. By PISAPED criteria, no evidence of pulmonary embolus. No wedge-shaped perfusion defects. Heterogeneous uptake throughout the lungs consistent with emphysema and atelectasis as seen on recent CT. Electronically Signed   By: MRanda NgoM.D.   On: 12/14/2021 15:00   [place] (PLEURAL EFFUSION)  Result Date: 12/14/2021 CLINICAL DATA:  Pleural effusion EXAM: CHEST ULTRASOUND COMPARISON:  None. FINDINGS: Limited ultrasound exam of the right chest was performed. Images demonstrate a small pleural effusion, not amenable for safe image guided thoracentesis. No thoracentesis performed. IMPRESSION: Small right pleural effusion.  No thoracentesis performed. Electronically Signed   By: YAlbin FellingM.D.   On: 12/14/2021 15:45   DG Chest Port 1 View  Result Date: 12/14/2021 CLINICAL DATA:  Right pleural effusion. EXAM: PORTABLE CHEST 1 VIEW COMPARISON:  CT chest dated December 12, 2021 FINDINGS: The heart is enlarged with evidence of prior coronary artery bypass grafting. Emphysematous changes of the lungs. Left pleural thickening and scarring is again noted. There also multiple small bilateral opacities, likely scarring/atelectasis. Small right pleural effusion with atelectasis is unchanged. Osteopenia and degenerative changes of the thoracic spine. IMPRESSION: 1.  Stable cardiomegaly. 2. Emphysematous  changes with multifocal bilateral atelectasis/scarring. Stable pleural thickening in the left upper lobe as well as in the mid left lung. Stable appearance of the small right pleural effusion with right basilar atelectasis. Electronically Signed   By: IKeane PoliceD.O.   On: 12/14/2021 10:35   [place]LIVER DOPPLER  Result Date: 12/14/2021 CLINICAL DATA:  Cirrhosis.  Evaluate hepatic vasculature. EXAM: DUPLEX ULTRASOUND OF LIVER TECHNIQUE: Color and duplex Doppler ultrasound was performed to evaluate the hepatic in-flow and out-flow vessels. COMPARISON:  Chest CT - 12/12/2021 FINDINGS: There is diffuse increased slightly coarsened echogenicity of the hepatic parenchyma with nodularity hepatic contour compatible with clinical suspicion of cirrhosis. No discrete hepatic lesions. No intrahepatic biliary ductal dilatation. Portal Vein Velocities (normal hepatopetal directional flow) Main:  17 cm/sec Right:  7 cm/sec Left:  11 cm/sec Hepatic Vein Velocities (normal hepatofugal directional flow) Right:  29 cm/sec Middle:  20 cm/sec Left:  9 cm/sec Hepatic Artery Velocity:  162 cm/sec Splenic Vein Velocity:  11 cm/sec Spleen: Normal in size measuring 12.0 x 12.0 x 3.0 cm with calculated volume of 245 cc. Varices: None visualized  Ascites: Small to moderate amount of intra-abdominal ascites. IMPRESSION: 1. Findings suggestive hepatic cirrhosis. No discrete hepatic lesions though further evaluation could be performed with nonemergent either cirrhotic protocol CT or (preferably) abdominal MRI as indicated. 2. Patent hepatic vasculature with normal directional flow. 3. Small to moderate amount of intra-abdominal ascites without evidence of splenomegaly. Electronically Signed   By: Sandi Mariscal M.D.   On: 12/14/2021 10:26        Scheduled Meds:  aspirin  81 mg Oral Daily   Chlorhexidine Gluconate Cloth  6 each Topical Q0600   furosemide  40 mg Intravenous Daily   heparin  5,000 Units Subcutaneous Q8H   insulin aspart   0-15 Units Subcutaneous TID WC   lidocaine  1 patch Transdermal Q24H   loratadine  10 mg Oral Daily   midodrine  2.5 mg Oral TID WC   multivitamin with minerals  1 tablet Oral Daily   mupirocin ointment  1 application Nasal BID   PARoxetine  10 mg Oral QHS   pravastatin  40 mg Oral q1800   propranolol  10 mg Oral BID   spironolactone  50 mg Oral Daily   Continuous Infusions:  sodium chloride Stopped (12/12/21 1350)    ceFAZolin (ANCEF) IV 2 g (12/14/21 0647)   metronidazole 500 mg (12/14/21 1545)     LOS: 5 days    Time spent: 30 min    Desma Maxim, MD [facility]   If 7PM-7AM, please contact night-coverage [email or web address] Password [email or web address] 12/14/2021, 4:11 PM

## 2021-12-14 NOTE — NC FL2 (Signed)
[place] LEVEL OF CARE SCREENING TOOL     IDENTIFICATION  Patient Name: Luis [name]. Birthdate: [birth year] Sex: male Admission Date (Current Location): 12/09/2021  [facility] and [place] Number:  Engineering geologist and Address:  [facility], [address], [place], [place] [address]      Provider Number: [id number]  Attending Physician Name and Address:  Gwynne Edinger, MD  Relative Name and Phone Number:  Aaron Edelman (son) [phone]    Current Level of Care: Hospital Recommended Level of Care: [place] Prior Approval Number:    Date Approved/Denied:   PASRR Number: [id number]  Discharge Plan: SNF    Current Diagnoses: Patient Active Problem List   Diagnosis Date Noted   Malnutrition of moderate degree 12/10/2021   Sepsis ([place]) 12/09/2021   Cellulitis 12/09/2021   Hypokalemia 12/09/2021   Pancytopenia ([place]) 12/16/2020   HCAP (healthcare-associated pneumonia) 11/07/2020   Severe sepsis ([place]) 11/07/2020   HLD (hyperlipidemia) 11/07/2020   GERD (gastroesophageal reflux disease) 11/07/2020   Depression with anxiety 11/07/2020   Chronic diastolic CHF (congestive heart failure) ([place]) 11/07/2020   Elevated troponin 11/07/2020   Atrial fibrillation with RVR ([place]) 11/07/2020   T7 vertebral fracture ([place]) 11/07/2020   Aspiration pneumonia ([place]) 11/07/2020   Squamous cell skin cancer 09/18/2020   Scalp lesion 08/24/2020   Maintenance chemotherapy 06/14/2020   COVID-19 virus infection (vaccinated [place] 01/2020) 06/14/2020   COVID-19 06/14/2020   Weight loss 11/22/2019   Compression fracture of body of thoracic vertebra (HCC) 09/19/2019   Macrocytic anemia 03/25/2019   Renal insufficiency 03/25/2019   Abnormal iron saturation 02/18/2019   Bilateral carotid artery stenosis 03/21/2018   Connective tissue and disc stenosis of intervertebral foramina of lumbar region 02/06/2018   Hip strain, initial encounter  02/06/2018   History of total right hip replacement 02/06/2018   Obesity (BMI 30-39.9) 02/06/2018   Goals of care, counseling/discussion 11/18/2017   Drug-induced neutropenia ([place]) 01/23/2017   Hypocalcemia 06/28/2016   Type 2 diabetes mellitus without complication, without long-term current use of insulin ([place]) 02/17/2016   Arthritis 05/01/2015   TI (tricuspid incompetence) 04/16/2015   Benign essential HTN 04/04/2015   B12 deficiency 03/31/2015   Diarrhea 03/31/2015   Multiple myeloma ([place]) 03/27/2015   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   MI (mitral incompetence) 09/17/2014   Obstructive apnea 09/17/2014   Neuritis or radiculitis due to rupture of lumbar intervertebral disc 08/27/2014   Spinal stenosis 08/27/2014   Degenerative arthritis of lumbar spine 08/27/2014   Degenerative arthritis of hip 08/27/2014   Arthritis, degenerative 03/28/2014   Chest pain 03/14/2014   Generalized weakness 03/14/2014   Breath shortness 03/14/2014   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 01/11/2014   Combined fat and carbohydrate induced hyperlipemia 01/11/2014   Compulsive tobacco user syndrome 01/11/2014   CAD (coronary artery disease) of artery bypass graft 01/11/2014    Orientation RESPIRATION BLADDER Height & Weight     Self, Time, Situation, Place  O2 (3L nasal cannula) Incontinent, External catheter Weight: 192 lb 3.9 oz (87.2 kg) Height:     BEHAVIORAL SYMPTOMS/MOOD NEUROLOGICAL BOWEL NUTRITION STATUS      Incontinent Diet (see discharge summary)  AMBULATORY STATUS COMMUNICATION OF NEEDS Skin   Extensive Assist Verbally Other (Comment) (cellulitis leg, excoriated hand, leg, chest)                       Personal Care Assistance Level of  Assistance  Bathing, Feeding, Dressing, Total care Bathing Assistance: Limited assistance Feeding assistance: Limited assistance Dressing Assistance: Limited assistance Total Care Assistance: Maximum  assistance   Functional Limitations Info  Sight, Hearing, Speech Sight Info: Adequate Hearing Info: Impaired Speech Info: Adequate    SPECIAL CARE FACTORS FREQUENCY  PT (By licensed PT), OT (By licensed OT)     PT Frequency: min 4x weekly OT Frequency: min 4x weekly            Contractures Contractures Info: Not present    Additional Factors Info  Code Status, Allergies Code Status Info: DNR Allergies Info: pravastatin sodium, statins, zocor (simvastatin)           Current Medications (12/14/2021):  This is the current hospital active medication list Current Facility-Administered Medications  Medication Dose Route Frequency Provider Last Rate Last Admin   0.9 %  sodium chloride infusion   Intravenous PRN Sharion Settler, NP   Stopped at 12/12/21 1350   acetaminophen (TYLENOL) tablet 650 mg  650 mg Oral Q6H PRN Agbata, Tochukwu, MD   650 mg at 12/14/21 9518   Or   acetaminophen (TYLENOL) suppository 650 mg  650 mg Rectal Q6H PRN Agbata, Tochukwu, MD       ALPRAZolam Duanne Moron) tablet 0.5 mg  0.5 mg Oral BID PRN Gwynne Edinger, MD   0.5 mg at 12/13/21 0445   aspirin chewable tablet 81 mg  81 mg Oral Daily Agbata, Tochukwu, MD   81 mg at 12/14/21 0827   ceFAZolin (ANCEF) IVPB 2g/100 mL premix  2 g Intravenous Q8H Darrick Penna, RPH 200 mL/hr at 12/14/21 0647 2 g at 12/14/21 0647   Chlorhexidine Gluconate Cloth 2 % PADS 6 each  6 each Topical Q0600 Gwynne Edinger, MD   6 each at 12/14/21 0412   furosemide (LASIX) injection 40 mg  40 mg Intravenous Daily Ottie Glazier, MD   40 mg at 12/14/21 0827   heparin injection 5,000 Units  5,000 Units Subcutaneous Q8H Gwynne Edinger, MD   5,000 Units at 12/14/21 0640   insulin aspart (novoLOG) injection 0-15 Units  0-15 Units Subcutaneous TID WC Agbata, Tochukwu, MD   2 Units at 12/13/21 1805   ipratropium-albuterol (DUONEB) 0.5-2.5 (3) MG/3ML nebulizer solution 3 mL  3 mL Nebulization Q6H PRN Agbata, Tochukwu, MD   3 mL at  12/10/21 1009   lidocaine (LIDODERM) 5 % 1 patch  1 patch Transdermal Q24H Sharion Settler, NP   1 patch at 12/13/21 1956   loratadine (CLARITIN) tablet 10 mg  10 mg Oral Daily Gwynne Edinger, MD   10 mg at 12/14/21 0827   metroNIDAZOLE (FLAGYL) IVPB 500 mg  500 mg Intravenous Q12H Gwynne Edinger, MD 100 mL/hr at 12/14/21 0022 500 mg at 12/14/21 0022   midodrine (PROAMATINE) tablet 2.5 mg  2.5 mg Oral TID WC Ottie Glazier, MD   2.5 mg at 12/14/21 0827   multivitamin with minerals tablet 1 tablet  1 tablet Oral Daily Gwynne Edinger, MD   1 tablet at 12/14/21 0827   mupirocin ointment (BACTROBAN) 2 % 1 application  1 application Nasal BID Gwynne Edinger, MD   1 application at 84/16/60 0827   ondansetron (ZOFRAN) tablet 4 mg  4 mg Oral Q6H PRN Agbata, Tochukwu, MD   4 mg at 12/11/21 2137   Or   ondansetron (ZOFRAN) injection 4 mg  4 mg Intravenous Q6H PRN Agbata, Tochukwu, MD       PARoxetine (  PAXIL) tablet 10 mg  10 mg Oral QHS Agbata, Tochukwu, MD   10 mg at 12/13/21 2238   pravastatin (PRAVACHOL) tablet 40 mg  40 mg Oral q1800 Agbata, Tochukwu, MD   40 mg at 12/13/21 1805   propranolol (INDERAL) tablet 10 mg  10 mg Oral BID Sharion Settler, NP   10 mg at 12/14/21 0827   spironolactone (ALDACTONE) tablet 50 mg  50 mg Oral Daily Ottie Glazier, MD   50 mg at 12/14/21 0827     Discharge Medications: Please see discharge summary for a list of discharge medications.  Relevant Imaging Results:  Relevant Lab Results:   Additional Information SSN: [id number]  Alberteen Sam, LCSW

## 2021-12-14 NOTE — TOC Progression Note (Signed)
Transition of Care (TOC) - Progression Note    Patient Details  Name: Luis [name]. MRN: [id number] Date of Birth: Jan 01, 1946  Transition of Care [facility]) CM/SW [place],  Phone Number: 12/14/2021, 10:00 AM  Clinical Narrative:     CSW updated patient's son Luis [name] regarding PT/OT recs of SNF prior to patient returning to [place]. He reports being in agreement with CSW sending referrals locally or to [facility] as patient has been to [facility] facility in the past due to [facility] living in the [place] area.   CSW has sent referrals for bed offers, pending bed offers at this time.   Luis [name] reports wishing to be involve din Palliative discussion and reports he will be at the hospital at 11am, CSW has informed Palliative NP of this.   CSW has sent Luis [name] an email at [email or web address] with contact information per his request.   No further questions or concerns at this time.   Expected Discharge Plan:  (ILF) Barriers to Discharge: Continued Medical Work up  Expected Discharge Plan and Services Expected Discharge Plan:  (ILF)                                               Social Determinants of Health (SDOH) Interventions    Readmission Risk Interventions No flowsheet data found.

## 2021-12-14 NOTE — Progress Notes (Signed)
PULMONOLOGY         Date: 12/14/2021,   MRN# [id number] Luis [name]. 21-Apr-1946     AdmissionWeight: 83.5 kg                 CurrentWeight: 87.2 kg   Referring physician: Dr Si Raider   CHIEF COMPLAINT:   Pulmonary hypertension with pleural effusions    HISTORY OF PRESENT ILLNESS   This is a 76 yo male with history of coronary artery disease status post CABG, history of multiple myeloma, diabetes mellitus with stage III chronic kidney disease, hypertension, anxiety and depression who presents to the ER for evaluation of worsening shortness of breath from his baseline and weakness. He had moderate oxygen desaturation in the field, bmp an dcbc reviewed by me with renal failure and nephrotic syndrome with lactic acidosis and electorlyte derrangements.  He is being treated for cellilitis of LE and acute on chronic CHF exacerbation. TTE was done with findings of significant pulmonary hypertension.    12/14/21- patient is somewhat unchanged, he did not have much fluid in pleural space so we cancelled throacentesis. The VQ san was negative for PE. There is atelectasis and emphysema bilaterally. He is in discussion with Palliative regarding GOC and I recommend hospice.   PAST MEDICAL HISTORY   Past Medical History:  Diagnosis Date   Angina pectoris ([place])    Anxiety    Arthritis    CHF (congestive heart failure) ([place])    Coronary artery disease    Depression    Diabetes mellitus without complication ([place])    Patient takes Metformin.   Diverticulosis    GERD (gastroesophageal reflux disease)    Hyperlipidemia    Hypertension    Multiple myeloma ([place])    Multiple myeloma ([place]) 03/27/2015   Myocardial infarction [facility]) April 2001   widowmaker   Shortness of breath dyspnea    Sleep apnea    No CPAP @ present   Spinal stenosis    Squamous cell carcinoma of skin 02/19/2014   Left dorsal hand. WD SCC with superficial infiltration. Re-shaved 04/23/2014. Re-shaved 09/05/2014.    Squamous cell carcinoma of skin 09/08/2020   Left ant scalp. WD SCC.   Squamous cell carcinoma of skin 02/04/2021   R hand dorsum, EDC      SURGICAL HISTORY   Past Surgical History:  Procedure Laterality Date   CARDIAC CATHETERIZATION     CARPAL TUNNEL RELEASE     CATARACT EXTRACTION     COLONOSCOPY WITH PROPOFOL N/A 11/11/2015   Procedure: COLONOSCOPY WITH PROPOFOL;  Surgeon: Lollie Sails, MD;  Location: [facility] ENDOSCOPY;  Service: Endoscopy;  Laterality: N/A;   CORONARY ARTERY BYPASS GRAFT     EYE SURGERY Bilateral    Cataract Extraction   INGUINAL HERNIA REPAIR     JOINT REPLACEMENT Right 2008   Right Total Hip Replacement   PILONIDAL CYST EXCISION     ROTATOR CUFF REPAIR     TOTAL HIP ARTHROPLASTY Right    VENTRAL HERNIA REPAIR N/A 08/15/2015   Procedure: VENTRAL HERNIA REPAIR WITH MESH ;  Surgeon: Leonie Green, MD;  Location: [facility] ORS;  Service: General;  Laterality: N/A;     FAMILY HISTORY   Family History  Problem Relation Age of Onset   Heart disease Father    Stroke Mother    Prostate cancer Maternal Grandfather 26     SOCIAL HISTORY   Social History   Tobacco Use   Smoking status: Former  Packs/day: 1.50    Years: 40.00    Pack years: 60.00    Types: Cigarettes    Quit date: 02/24/2000    Years since quitting: 21.8   Smokeless tobacco: Former    Quit date: 03/04/2000  Vaping Use   Vaping Use: Never used  Substance Use Topics   Alcohol use: No   Drug use: No     MEDICATIONS    Home Medication:    Current Medication:  Current Facility-Administered Medications:    0.9 %  sodium chloride infusion, , Intravenous, PRN, Sharion Settler, NP, Stopped at 12/12/21 1350   acetaminophen (TYLENOL) tablet 650 mg, 650 mg, Oral, Q6H PRN, 650 mg at 12/14/21 0328 **OR** acetaminophen (TYLENOL) suppository 650 mg, 650 mg, Rectal, Q6H PRN, Agbata, Tochukwu, MD   ALPRAZolam Duanne Moron) tablet 0.5 mg, 0.5 mg, Oral, BID PRN, Gwynne Edinger, MD, 0.5  mg at 12/13/21 0445   aspirin chewable tablet 81 mg, 81 mg, Oral, Daily, Agbata, Tochukwu, MD, 81 mg at 12/14/21 0827   ceFAZolin (ANCEF) IVPB 2g/100 mL premix, 2 g, Intravenous, Q8H, Darrick Penna, RPH, Last Rate: 200 mL/hr at 12/14/21 0647, 2 g at 12/14/21 0647   Chlorhexidine Gluconate Cloth 2 % PADS 6 each, 6 each, Topical, Q0600, Gwynne Edinger, MD, 6 each at 12/14/21 0412   furosemide (LASIX) injection 40 mg, 40 mg, Intravenous, Daily, Lanney Gins, Charmian Forbis, MD, 40 mg at 12/14/21 0827   heparin injection 5,000 Units, 5,000 Units, Subcutaneous, Q8H, Wouk, Ailene Rud, MD, 5,000 Units at 12/14/21 0640   insulin aspart (novoLOG) injection 0-15 Units, 0-15 Units, Subcutaneous, TID WC, Agbata, Tochukwu, MD, 2 Units at 12/13/21 1805   ipratropium-albuterol (DUONEB) 0.5-2.5 (3) MG/3ML nebulizer solution 3 mL, 3 mL, Nebulization, Q6H PRN, Agbata, Tochukwu, MD, 3 mL at 12/10/21 1009   lidocaine (LIDODERM) 5 % 1 patch, 1 patch, Transdermal, Q24H, Sharion Settler, NP, 1 patch at 12/13/21 1956   loratadine (CLARITIN) tablet 10 mg, 10 mg, Oral, Daily, Wouk, Ailene Rud, MD, 10 mg at 12/14/21 0827   metroNIDAZOLE (FLAGYL) IVPB 500 mg, 500 mg, Intravenous, Q12H, Wouk, Ailene Rud, MD, Last Rate: 100 mL/hr at 12/14/21 0022, 500 mg at 12/14/21 0022   midodrine (PROAMATINE) tablet 2.5 mg, 2.5 mg, Oral, TID WC, Bailei Buist, MD, 2.5 mg at 12/14/21 0827   multivitamin with minerals tablet 1 tablet, 1 tablet, Oral, Daily, Wouk, Ailene Rud, MD, 1 tablet at 12/14/21 0827   mupirocin ointment (BACTROBAN) 2 % 1 application, 1 application, Nasal, BID, Wouk, Ailene Rud, MD, 1 application at 38/25/05 0827   ondansetron (ZOFRAN) tablet 4 mg, 4 mg, Oral, Q6H PRN, 4 mg at 12/11/21 2137 **OR** ondansetron (ZOFRAN) injection 4 mg, 4 mg, Intravenous, Q6H PRN, Agbata, Tochukwu, MD   PARoxetine (PAXIL) tablet 10 mg, 10 mg, Oral, QHS, Agbata, Tochukwu, MD, 10 mg at 12/13/21 2238   pravastatin (PRAVACHOL) tablet 40 mg,  40 mg, Oral, q1800, Agbata, Tochukwu, MD, 40 mg at 12/13/21 1805   propranolol (INDERAL) tablet 10 mg, 10 mg, Oral, BID, Sharion Settler, NP, 10 mg at 12/14/21 3976   spironolactone (ALDACTONE) tablet 50 mg, 50 mg, Oral, Daily, Lanney Gins, Ariv Penrod, MD, 50 mg at 12/14/21 0827    ALLERGIES   Pravastatin sodium, Statins, and Zocor [simvastatin]     REVIEW OF SYSTEMS    Review of Systems:  Gen:  Denies  fever, sweats, chills weigh loss  HEENT: Denies blurred vision, double vision, ear pain, eye pain, hearing loss, nose bleeds, sore throat Cardiac:  No dizziness, chest pain or heaviness, chest tightness,edema Resp:   Denies cough or sputum porduction, shortness of breath,wheezing, hemoptysis,  Gi: Denies swallowing difficulty, stomach pain, nausea or vomiting, diarrhea, constipation, bowel incontinence Gu:  Denies bladder incontinence, burning urine Ext:   Denies Joint pain, stiffness or swelling Skin: Denies  skin rash, easy bruising or bleeding or hives Endoc:  Denies polyuria, polydipsia , polyphagia or weight change Psych:   Denies depression, insomnia or hallucinations   Other:  All other systems negative   VS: BP 102/60    Pulse 61    Temp 98.1 F (36.7 C) (Oral)    Resp 20    Wt 87.2 kg    SpO2 97%    BMI 30.11 kg/m      PHYSICAL EXAM    GENERAL:NAD, no fevers, chills, no weakness no fatigue HEAD: Normocephalic, atraumatic.  EYES: Pupils equal, round, reactive to light. Extraocular muscles intact. No scleral icterus.  MOUTH: Moist mucosal membrane. Dentition intact. No abscess noted.  EAR, NOSE, THROAT: Clear without exudates. No external lesions.  NECK: Supple. No thyromegaly. No nodules. No JVD.  PULMONARY: mild rhonchi at bases bilaterally CARDIOVASCULAR: S1 and S2. Regular rate and rhythm. No murmurs, rubs, or gallops. No edema. Pedal pulses 2+ bilaterally.  GASTROINTESTINAL: Soft, nontender, nondistended. No masses. Positive bowel sounds. No hepatosplenomegaly.   MUSCULOSKELETAL: No swelling, clubbing, or edema. Range of motion full in all extremities.  NEUROLOGIC: Cranial nerves II through XII are intact. No gross focal neurological deficits. Sensation intact. Reflexes intact.  SKIN: No ulceration, lesions, rashes, or cyanosis. Skin warm and dry. Turgor intact.  PSYCHIATRIC: Mood, affect within normal limits. The patient is awake, alert and oriented x 3. Insight, judgment intact.       IMAGING    DG Chest 2 View  Result Date: 12/01/2021 CLINICAL DATA:  Fall, tachycardia EXAM: CHEST - 2 VIEW COMPARISON:  09/07/2021 FINDINGS: Interstitial coarsening secondary to parenchymal scarring better appreciated on CT examination of 11/07/2020 within the left mid and lower lung zone is unchanged. Resultant mild left-sided volume loss again noted. No superimposed confluent pulmonary infiltrate. No pneumothorax or pleural effusion. Coronary artery bypass grafting has been performed. Cardiac size is within normal limits. Pulmonary vascularity is normal. Osseous structures are age-appropriate. No acute bone abnormality. IMPRESSION: No active cardiopulmonary disease. Electronically Signed   By: Fidela Salisbury M.D.   On: 12/01/2021 20:21   CT Head Wo Contrast  Result Date: 12/01/2021 CLINICAL DATA:  Trip and fall striking head. EXAM: CT HEAD WITHOUT CONTRAST TECHNIQUE: Contiguous axial images were obtained from the base of the skull through the vertex without intravenous contrast. RADIATION DOSE REDUCTION: This exam was performed according to the departmental dose-optimization program which includes automated exposure control, adjustment of the mA and/or kV according to patient size and/or use of iterative reconstruction technique. COMPARISON:  Provided head CT 12/21/2008 FINDINGS: Brain: Generalized cerebral atrophy. No intracranial hemorrhage, mass effect, or midline shift. No hydrocephalus. The basilar cisterns are patent. Mild periventricular and deep chronic small  vessel ischemic change. No evidence of territorial infarct or acute ischemia. No extra-axial or intracranial fluid collection. Vascular: Atherosclerosis of skullbase vasculature without hyperdense vessel or abnormal calcification. Skull: No fracture or focal lesion. Sinuses/Orbits: No acute findings. Other: Right frontal scalp hematoma. IMPRESSION: 1. Right frontal scalp hematoma. No acute intracranial abnormality. No skull fracture. 2. Generalized atrophy and chronic small vessel ischemic change. Electronically Signed   By: Keith Rake M.D.   On: 12/01/2021 18:39  CT CHEST WO CONTRAST  Result Date: 12/12/2021 CLINICAL DATA:  Aspiration hypoxia. Fluid overload. Infection. Shortness of breath. EXAM: CT CHEST WITHOUT CONTRAST TECHNIQUE: Multidetector CT imaging of the chest was performed following the standard protocol without IV contrast. RADIATION DOSE REDUCTION: This exam was performed according to the departmental dose-optimization program which includes automated exposure control, adjustment of the mA and/or kV according to patient size and/or use of iterative reconstruction technique. COMPARISON:  Chest radiograph 12/11/2021. Esophagram 12/11/2021. CT 12/09/2021 FINDINGS: Cardiovascular: Cardiac enlargement. No pericardial effusions. Postoperative changes in the mediastinum consistent with coronary bypass. Calcification of the aorta. No aneurysm. Mediastinum/Nodes: Thyroid gland is unremarkable. Esophagus is decompressed. Scattered lymph nodes throughout the mediastinum without pathologic enlargement, likely reactive. Lungs/Pleura: Small bilateral pleural effusions. Pleural calcification on the left may represent post inflammatory process. Diffuse emphysematous changes in the lungs. Atelectasis or consolidation in both lung bases with patchy areas of airspace infiltration seen in both lungs, most prominently in the right upper lung. Pulmonary infiltration is progressing since previous study, possibly  progressing pneumonia or interval aspiration. Upper Abdomen: Upper abdominal ascites. Liver morphology consistent with hepatic cirrhosis. Mild gynecomastia. Musculoskeletal: Sternotomy wires. Degenerative changes in the spine and shoulders. Prominent thoracic kyphosis and mild scoliosis convex towards the right. Multiple lucent lesions, some with expansile change, are demonstrated in several ribs and in the spine. This is consistent with history of multiple myeloma. Several vertebral compression deformities are present. There is an acute or subacute appearing fracture of the body of a midthoracic vertebra, likely T7. This likely represents a pathologic fracture. No change since the previous study from several days ago. IMPRESSION: 1. Bilateral pleural effusions with progressing infiltrates, particularly on the right. This could indicate progression of pneumonia or interval aspiration. 2. Stable appearance of multiple bone lesions and vertebral fractures of differing ages, including an acute appearing fracture probably at T7. No change since previous study. 3. Changes of hepatic cirrhosis with upper abdominal ascites. 4. Aortic atherosclerosis. 5. Emphysematous changes. Electronically Signed   By: Lucienne Capers M.D.   On: 12/12/2021 17:58   CT CHEST WO CONTRAST  Result Date: 12/09/2021 CLINICAL DATA:  Shortness of breath. Generalized weakness. Recent fall. Sepsis protocol initiated by EMS. EXAM: CT CHEST WITHOUT CONTRAST TECHNIQUE: Multidetector CT imaging of the chest was performed following the standard protocol without IV contrast. RADIATION DOSE REDUCTION: This exam was performed according to the departmental dose-optimization program which includes automated exposure control, adjustment of the mA and/or kV according to patient size and/or use of iterative reconstruction technique. COMPARISON:  Portable chest obtained earlier today. Chest CTA dated 11/07/2020. FINDINGS: Cardiovascular: Atheromatous  calcifications, including the coronary arteries and aorta. Mildly enlarged heart, unchanged. Mediastinum/Nodes: No enlarged mediastinal or axillary lymph nodes. Thyroid gland, trachea, and esophagus demonstrate no significant findings. Lungs/Pleura: Bilateral dependent atelectasis. Stable parenchymal scarring in the posterolateral lingula. Minimal right upper lobe dependent atelectasis with improvement. No significant change in bilateral bullous changes in a centrilobular distribution. Interval minimal right pleural effusion. Small left pleural effusion without significant change. Stable pleural calcifications on the left. Upper Abdomen: Interval small amount of ascites. Contracted gallbladder with no significant change in multiple small calculi measuring up to 6 mm in maximum diameter each. No gallbladder wall thickening or pericholecystic fluid. Ingested tablet fragments in the stomach and duodenum. Musculoskeletal: Previously noted multiple lytic lesions in the bony skeleton compatible with previously reported multiple myeloma. Stable T10 fracture deformity without acute fracture lines. An oblique fracture is again demonstrated in the T7  vertebral body extending through the pedicle on the right without additional posterior extension. IMPRESSION: 1. No interval findings suspicious for pneumonia. 2. Bilateral dependent atelectasis and lingular scarring. 3. Interval minimal right pleural effusion and stable small left pleural effusion. 4. Stable bony changes of multiple myeloma with an acute pathological fracture of the T7 vertebral body and old compression fracture of the T10 vertebral body. 5. Stable changes of COPD with centrilobular emphysema. 6. Stable dense calcific coronary artery and aortic atherosclerosis. 7. Cholelithiasis without evidence of cholecystitis. 8. Stable probable posttraumatic or postinflammatory left pleural calcifications. Aortic Atherosclerosis (ICD10-I70.0) and Emphysema (ICD10-J43.9).  Electronically Signed   By: Claudie Revering M.D.   On: 12/09/2021 16:17   CT Cervical Spine Wo Contrast  Result Date: 12/01/2021 CLINICAL DATA:  Trip and fall.  Neck trauma. EXAM: CT CERVICAL SPINE WITHOUT CONTRAST TECHNIQUE: Multidetector CT imaging of the cervical spine was performed without intravenous contrast. Multiplanar CT image reconstructions were also generated. RADIATION DOSE REDUCTION: This exam was performed according to the departmental dose-optimization program which includes automated exposure control, adjustment of the mA and/or kV according to patient size and/or use of iterative reconstruction technique. COMPARISON:  CT cervical spine 11/07/2020 FINDINGS: Alignment: Straightening of normal lordosis. No traumatic subluxation. Skull base and vertebrae: No acute fracture. Intact dens and skull base. C1-C2 degenerative change. Bridging anterior as well as posterior osteophytes with bony ankylosis. No osteophyte fracture. The bones are under mineralized with heterogeneous marrow, no bony destructive process. Soft tissues and spinal canal: No prevertebral fluid or swelling. No visible canal hematoma. Disc levels: Diffuse disc space narrowing with ossification of scattered disc spaces, unchanged. Flowing osteophytes anterior and posteriorly with bony ankylosis. Multilevel neural foraminal narrowing without high-grade canal stenosis. Upper chest: Chronic left pleuroparenchymal thickening. No acute findings. Other: Advanced carotid calcifications. IMPRESSION: 1. No acute fracture or traumatic subluxation of the cervical spine. 2. Unchanged appearance of diffuse degenerative disc disease and flowing anterior and posterior osteophytes, typical of ankylosis. Electronically Signed   By: Keith Rake M.D.   On: 12/01/2021 18:45   MR LUMBAR SPINE WO CONTRAST  Result Date: 12/12/2021 CLINICAL DATA:  History of epidural steroid injections in lumbar spine, bacteremia, concern for osteomyelitis. EXAM: MRI  LUMBAR SPINE WITHOUT CONTRAST TECHNIQUE: Multiplanar, multisequence MR imaging of the lumbar spine was performed. No intravenous contrast was administered. COMPARISON:  04/06/2019 FINDINGS: Segmentation:  Standard. Alignment:  Levocurvature of the lumbar spine. Vertebrae: No acute fracture or suspicious osseous lesion. No evidence of discitis or osteomyelitis. Unchanged degenerative changes at the anterior superior endplate of L5. Vertebral body endplates are otherwise intact.Redemonstrated cystic lesion in the left iliac bone, which appears unchanged compared to 2020. Conus medullaris and cauda equina: Conus extends to the T12 level. Conus and cauda equina appear normal. No epidural collection. Paraspinal and other soft tissues: Negative. Disc levels: L1-L2: No significant disc bulge. No spinal canal stenosis or neural foraminal narrowing. L2-L3: Mild disc bulge. Mild facet arthropathy. Narrowing of the lateral recesses. No spinal canal stenosis or neural foraminal narrowing. L3-L4: Mild disc bulge. Moderate right and mild left facet arthropathy. Narrowing of the lateral recesses. No spinal canal stenosis or neural foraminal narrowing. L4-L5: Mild disc bulge. Severe facet arthropathy. Effacement of the lateral recesses. Moderate spinal canal stenosis, which appears similar to the prior exam. Unchanged moderate bilateral neural foraminal narrowing. L5-S1: No significant disc bulge. Moderate right and mild left facet arthropathy. No spinal canal stenosis or neural foraminal narrowing. IMPRESSION: 1. No acute osseous abnormality. No  evidence of discitis osteomyelitis. 2. L4-L5 moderate spinal canal stenosis and moderate bilateral neural foraminal narrowing, unchanged. Effacement of the lateral recesses at this level likely compresses the descending L5 nerve roots. 3. Narrowing of the lateral recesses at L2-L3 and L3-L4 could affect the descending L3 and L4 nerve roots, respectively. Electronically Signed   By: Merilyn Baba M.D.   On: 12/12/2021 03:16   US RENAL  Result Date: 12/11/2021 CLINICAL DATA:  Acute kidney injury EXAM: RENAL / URINARY TRACT ULTRASOUND COMPLETE COMPARISON:  None. FINDINGS: Right Kidney: Renal measurements: 10.4 x 4.6 x 5.4 cm = volume: 135.2 mL. Echogenicity within normal limits. No mass or hydronephrosis visualized. Left Kidney: Renal measurements: 9.4 x 5 x 4 cm = volume: 99.1 mL. Echogenicity within normal limits. No mass or hydronephrosis visualized. Bladder: Appears normal for degree of bladder distention. Suboptimal evaluation due to bandage per technologist. Other: Ascites. IMPRESSION: No hydronephrosis. Ascites. Electronically Signed   By: Macy Mis M.D.   On: 12/11/2021 16:54   DG Chest Port 1 View  Result Date: 12/11/2021 CLINICAL DATA:  Aspiration pneumonia EXAM: PORTABLE CHEST 1 VIEW COMPARISON:  CT chest 12/09/2021 FINDINGS: Bilateral patchy interstitial and alveolar airspace opacities. No pleural effusion or pneumothorax. Stable cardiomegaly. Prior CABG. No acute osseous abnormality. IMPRESSION: 1. Bilateral patchy interstitial and alveolar airspace opacities concerning for multilobar pneumonia versus pulmonary edema. Electronically Signed   By: Kathreen Devoid M.D.   On: 12/11/2021 18:55   DG Chest Port 1 View  Result Date: 12/11/2021 CLINICAL DATA:  Shortness of breath EXAM: PORTABLE CHEST 1 VIEW COMPARISON:  Previous studies including the examination of 12/09/2021 FINDINGS: Transverse diameter of heart is increased. Central pulmonary vessels are prominent. Breathing motion limits evaluation of lung fields. There are no signs of alveolar pulmonary edema. There is asymmetric pleural density in the lateral aspect of left apex with no significant change. There is some crowding of markings in the lower lung fields. No new focal pulmonary consolidation is seen. There is blunting of lateral CP angles, more so on the left side. There is no pneumothorax. Multiple old fractures are seen  in the left ribs. There is evidence of previous coronary bypass surgery. IMPRESSION: Cardiomegaly. There are no signs of alveolar pulmonary edema or new focal pulmonary consolidation. Small bilateral pleural effusions, more so on the left side. Electronically Signed   By: Elmer Picker M.D.   On: 12/11/2021 15:27   DG Chest Port 1 View  Result Date: 12/09/2021 CLINICAL DATA:  Shortness of breath. History of congestive heart failure, hypertension and diabetes. Questionable sepsis-evaluate for abnormality. EXAM: PORTABLE CHEST 1 VIEW COMPARISON:  Radiographs 12/01/2021 and 09/07/2021.  CT 11/07/2020. FINDINGS: 1058 hours. The heart size and mediastinal contours are stable status post median sternotomy and CABG. There is aortic atherosclerosis. There is chronic lung disease with asymmetric pleural thickening and parenchymal scarring on the left. Compared with the most recent radiographs, there are increased interstitial and ground-glass opacities throughout both lungs which could reflect superimposed mild edema. No confluent airspace opacity, pneumothorax or significant pleural effusion identified. Old rib fractures are present on the left. There is advanced arthropathy at both shoulders. IMPRESSION: Increased interstitial and ground-glass opacities throughout both lungs compared with most recent radiographs, suggesting possible mild edema. Underlying chronic pleuroparenchymal scarring as described. Electronically Signed   By: Richardean Sale M.D.   On: 12/09/2021 11:11   ECHOCARDIOGRAM COMPLETE  Result Date: 12/12/2021    ECHOCARDIOGRAM REPORT   Patient Name:   Prayan  A Shirleen Schirmer. Date of Exam: 12/12/2021 Medical Rec #:  [id number]        Height:       67.0 in Accession #:    [id number]       Weight:       181.4 lb Date of Birth:  1945/11/15       BSA:          1.940 m Patient Age:    23 years         BP:           98/63 mmHg Patient Gender: M                HR:           62 bpm. Exam Location:  [facility] Procedure:  2D Echo Indications:     Bacteremia R78.81  History:         Patient has prior history of Echocardiogram examinations, most                  recent 11/10/2020.  Sonographer:     Kathlen Brunswick RDCS Referring Phys:  [place] Diagnosing Phys: [place]  1. Left ventricular ejection fraction, by estimation, is 60 to 65%. The left ventricle has normal function. The left ventricle has no regional wall motion abnormalities. Left ventricular diastolic parameters are indeterminate. There is the interventricular septum is flattened in systole, consistent with right ventricular pressure overload.  2. Right ventricular systolic function is severely reduced. The right ventricular size is severely enlarged. There is severely elevated pulmonary artery systolic pressure. The estimated right ventricular systolic pressure is 05.3 mmHg.  3. Right atrial size was severely dilated.  4. The mitral valve is degenerative. Mild to moderate mitral valve regurgitation. No evidence of mitral stenosis.  5. Tricuspid valve regurgitation is severe.  6. The aortic valve is normal in structure. Aortic valve regurgitation is not visualized. No aortic stenosis is present.  7. The inferior vena cava is normal in size with greater than 50% respiratory variability, suggesting right atrial pressure of 3 mmHg.  8. Cannot exclude a small PFO. FINDINGS  Left Ventricle: Left ventricular ejection fraction, by estimation, is 60 to 65%. The left ventricle has normal function. The left ventricle has no regional wall motion abnormalities. The left ventricular internal cavity size was small. There is no left ventricular hypertrophy. The interventricular septum is flattened in systole, consistent with right ventricular pressure overload. Left ventricular diastolic parameters are indeterminate. Right Ventricle: The right ventricular size is severely enlarged. Right vetricular wall thickness was not well visualized. Right ventricular  systolic function is severely reduced. There is severely elevated pulmonary artery systolic pressure. The tricuspid regurgitant velocity is 4.50 m/s, and with an assumed right atrial pressure of 10 mmHg, the estimated right ventricular systolic pressure is 97.6 mmHg. Left Atrium: Left atrial size was normal in size. Right Atrium: Right atrial size was severely dilated. Pericardium: There is no evidence of pericardial effusion. Mitral Valve: The mitral valve is degenerative in appearance. Mild to moderate mitral valve regurgitation. No evidence of mitral valve stenosis. Tricuspid Valve: The tricuspid valve is normal in structure. Tricuspid valve regurgitation is severe. No evidence of tricuspid stenosis. Aortic Valve: The aortic valve is normal in structure. Aortic valve regurgitation is not visualized. No aortic stenosis is present. Aortic valve peak gradient measures 5.5 mmHg. Pulmonic Valve: The pulmonic valve was grossly normal. Pulmonic valve regurgitation is not visualized. No evidence of pulmonic stenosis.  Aorta: The aortic root is normal in size and structure. Venous: The inferior vena cava is normal in size with greater than 50% respiratory variability, suggesting right atrial pressure of 3 mmHg. IAS/Shunts: Cannot exclude a small PFO.  LEFT VENTRICLE PLAX 2D LVIDd:         4.90 cm     Diastology LVIDs:         3.50 cm     LV e' medial:    4.24 cm/s LV PW:         1.20 cm     LV E/e' medial:  9.8 LV IVS:        1.20 cm     LV e' lateral:   5.00 cm/s LVOT diam:     2.10 cm     LV E/e' lateral: 8.3 LV SV:         38 LV SV Index:   20 LVOT Area:     3.46 cm  LV Volumes (MOD) LV vol d, MOD A4C: 37.4 ml LV vol s, MOD A4C: 16.5 ml LV SV MOD A4C:     37.4 ml RIGHT VENTRICLE RV Basal diam:  5.80 cm RV S prime:     9.46 cm/s TAPSE (M-mode): 1.9 cm LEFT ATRIUM             Index        RIGHT ATRIUM           Index LA diam:        3.50 cm 1.80 cm/m   RA Area:     18.40 cm LA Vol (A2C):   30.8 ml 15.87 ml/m  RA  Volume:   53.10 ml  27.37 ml/m LA Vol (A4C):   18.9 ml 9.74 ml/m LA Biplane Vol: 25.3 ml 13.04 ml/m  AORTIC VALVE                 PULMONIC VALVE AV Area (Vmax): 1.57 cm     PV Vmax:          0.90 m/s AV Vmax:        117.00 cm/s  PV Peak grad:     3.3 mmHg AV Peak Grad:   5.5 mmHg     PR End Diast Vel: 16.00 msec LVOT Vmax:      52.90 cm/s LVOT Vmean:     30.600 cm/s LVOT VTI:       0.110 m  AORTA Ao Root diam: 3.60 cm Ao Asc diam:  3.10 cm MITRAL VALVE               TRICUSPID VALVE MV Area (PHT): 3.34 cm    TV Peak grad:   64.8 mmHg MV Decel Time: 227 msec    TV Vmax:        4.03 m/s MV E velocity: 41.60 cm/s  TR Peak grad:   81.0 mmHg MV A velocity: 63.00 cm/s  TR Vmax:        450.00 cm/s MV E/A ratio:  0.66                            SHUNTS                            Systemic VTI:  0.11 m  Systemic Diam: 2.10 cm Donnelly Angelica Electronically signed by Donnelly Angelica Signature Date/Time: 12/12/2021/3:04:08 PM    Final    DG ESOPHAGUS W DOUBLE CM (HD)  Result Date: 12/11/2021 CLINICAL DATA:  A 76 year old male presents for evaluation of swallowing and dysphagia. EXAM: ESOPHOGRAM/BARIUM SWALLOW TECHNIQUE: Single contrast examination was performed using  thin barium. FLUOROSCOPY: Fluoroscopy time: 2 minutes 48 seconds Fluoroscopy dose: 50.7 mGy COMPARISON:  Chest CT from December 09, 2021. FINDINGS: The exam was markedly limited due to the patient's debilitated state and difficulty in positioning the patient. Exam was performed in slightly LPO just off AP and RPO positioning. The esophagus was found to be markedly patulous above the GE junction where there was tortuosity and extensive tertiary contractions throughout the exam. Long segment narrowing of the GE junction was noted on multiple images though this could not be challenged with a large bolus and the presence of dysmotility also limited assessment of this area. This appears to be smooth and long segment. Along the distal medial esophagus  there are small pulsion diverticula with distortion of the medial esophageal wall. Contrast did pass into the stomach aided by the administration of some water. Additional images could not be obtained due to coughing that the patient experienced though there was no visible "gross" aspiration.There may be some laryngeal penetration though this area was difficult to evaluate. In the RPO position marked "corkscrewing" and tortuosity of the proximal esophagus was noted with the patient in kyphotic position at baseline. IMPRESSION: 1. Markedly patulous esophagus above the GE junction with extensive tertiary contractions throughout the exam. "Corkscrewing" of the esophagus as well was noted with limited peristaltic activity distally though there was emptying of the esophagus despite segmental narrowing of the distal esophagus into the stomach. Findings could relate to distal esophageal stricture but could not be well assessed due to the debilitated nature of the patient. Marked dysmotility and the long segment narrowing with beaked appearance raising the question achalasia. Esophageal manometry and endoscopy may be helpful to narrow the differential. 2. Given difficulty with swallowing a dedicated swallow function may also be helpful. Electronically Signed   By: Zetta Bills M.D.   On: 12/11/2021 16:04      ASSESSMENT/PLAN   Acute on chronic hypoxemic respiratory failure    -patient initially with leukocytosis and sepsis presumably due to cellulitis     -patient has multifactorial causes including severe emphysema, bilateral effusions, chronic lung disease with rounded atelectasis and pleural calcification, pulmonary infiltrates suggestive of ongoing pneumonia, CHF exacerbation , COPD exacerbation, pulmonary hypertension and extreme kyphophosis with restrictive physiology not to mention physical deconditioning and unmentioned comorbid conditions.     - He is currently with renal failure which appears to be  acute on chronic and is likely to hinder diuresis necessary for effusions.    -he has staph aureus bacteremia -will hold propranolol to potentiate BP and allow better diuresis for now  Acute on chronic renal failure KDIGO 3  Dc nonessential nephrotonix    - patient with UTI but only 1K colonies      - dc non essential nephrotoxins - will hold vitamin C, protonix, vitamin D, calcium until acutely ill status is improved.      Bilateral pleural effusions  Diagnositic thoracentesis and therapeutic thoracentesis -aldactone 50 mg and lasix IV   Sepsis with staph bacteremia  Present on admission    - on ancef and flagyl    - ID team on case appreciate input.    Moderate  to severe Pulmonary hypertension     -patient may have had PE although his symptoms may be explained by all of the above medical problems   - we will order VQ scan for now to rule out any large defects   - I suspect PH is group 2 due to heart failure with valvular heart disease which mainly focuses on optimization of CHF status  Acute on chronic diastolic CHF  BNP >9579 -TTE reviewed -agree with diuresis for now -monitor telemetry     Extreme kyphosis and physical deconditioning    Patient is at high risk of chronic lung disease due to kyphosis and sedentary lifestyle , he is however DNR and is with advanced age.  There is consultation placed already for palliative care which seems reasonable.      Thank you for allowing me to participate in the care of this patient.    Patient/Family are satisfied with care plan and all questions have been answered.  This document was prepared using Dragon voice recognition software and may include unintentional dictation errors.     Ottie Glazier, M.D.  Division of [facility]

## 2021-12-14 NOTE — Progress Notes (Addendum)
7 yom DNR admitted for sepsis. Patient is alert, oriented and cooperative. Patient received Flagyl and Rocephin this shift. Last noted Lactic Acid was 2.5 on 12/10/2021  Patient has excoriations on head, and bilateral upper and lower extremities. Sacral ulcer and hemorrhoid present. Barrier cream applied/ sacral foam changed.  Patient's blood pressure has been trending low this shift. See Flowsheets

## 2021-12-14 NOTE — Consult Note (Signed)
Consultation Note Date: 12/14/2021   Patient Name: Luis [name].  DOB: 10-Oct-1946  MRN: [id number]  Age / Sex: 76 y.o., male  PCP: Tracie Harrier, MD Referring Physician: Gwynne Edinger, MD  Reason for Consultation: Establishing goals of care  HPI/Patient Profile: 76 y.o. 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   Clinical Assessment and Goals of Care: I have reviewed medical records including EPIC notes, labs and imaging, received report from RN, assessed the patient.  Luis [name] is sitting up in the bed in his room.  He greets me, making and mostly keeping eye contact.  He is alert and oriented x3, able to make his basic needs known.  His son/healthcare surrogate, Aaron Edelman, is at bedside along with a friend Ray.  Rate department so that we may have private goals of care discussion.  We meet to discuss diagnosis prognosis, GOC, EOL wishes, disposition and options.  I introduced Palliative Medicine as specialized medical care for people living with serious illness. It focuses on providing relief from the symptoms and stress of a serious illness. The goal is to improve quality of life for both the patient and the family.  We discussed a brief life review of the patient.  Luis [name] is a widower, his wife died in 01/06/19.  His son shares that Luis [name] wife did most of the eye ADLs for the home.  Luis [name] moved into independent living facility about 1 year ago.  Aaron Edelman shares that there is been a functional decline over the last 6  months, 1 month in particular.  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   We then focused on their current illness.  We talk in detail about his acute and chronic health concerns.  We talk in detail about coronary artery disease and heart failure and the treatment plan; valvular dysfunction, Luis [name] states that he would not accept any further heart surgeries; pulmonary hypertension with pulmonology consult and recommendations; CKD including labs and finding a balance with diuretics for heart and kidneys; multiple myeloma and most recent oncological visit and treatment plan; skin cancers and radiation treatment; bacteremia and the treatment plan including TEE, among other things.  We talked about decreasing functional status and qualifying for short-term rehab.  Advanced directives, concepts specific to code status, were considered and discussed.  Both Luis [name] and his healthcare surrogate Gaspar Bidding endorse DNR  Hospice and Palliative Care services outpatient were explained and offered.  We talk about outpatient palliative services to continue goals of care/CODE STATUS discussions.  We also briefly talk about hospice care before Luis [name] leaves for nuclear medicine scan.  Aaron Edelman and I continue discussions related to at home "treat the treatable"  hospice care.  Aaron Edelman states that he feels that that his father would prefer a more gentle approach, hospice care.  He shares that his father would like to be able to sit in his chair at night and have several glasses of wine.  Discussed the importance of continued conversation with family and the medical providers regarding overall plan of care and treatment options, ensuring decisions are within the context of the patients values and GOCs.   Questions and concerns were addressed.   The patient and family was encouraged to call with questions or concerns.  PMT will continue to support holistically.  Conference with attending,  pulmonologist, bedside nursing staff, transition of care team related to patient condition, needs, goals of care, disposition.  PMT to continue to follow   HCPOA NEXT OF KIN -Luis [name] names his sons, Aaron Edelman and Shanon Brow, as his healthcare surrogates.  His wife died in 2018/12/29.    SUMMARY OF RECOMMENDATIONS   At this point continue to treat the treatable but no CPR or intubation Considering short-term rehab versus home (ILF) with home health Considering outpatient palliative versus "treat the treatable" hospice PMT to continue to follow.   Code Status/Advance Care Planning: DNR -verified with patient and son Aaron Edelman.  Symptom Management:  Per hospitalist, no additional needs at this time.  Palliative Prophylaxis:  Frequent Pain Assessment, Oral Care, Palliative Wound Care, and Turn Reposition  Additional Recommendations (Limitations, Scope, Preferences): At this point continue to treat the treatable but no CPR or intubation.  Considering palliative versus hospice care  Psycho-social/Spiritual:  Desire for further Chaplaincy support:no Additional Recommendations: Caregiving  Support/Resources and Education on Hospice  Prognosis:  < 6 months, or less would not be surprising based on chronic illness burden, decreasing functional status over the last 6 months, last 1 month in particular.  Discharge Planning:  To be determined.  Considering returning to independent living facility with home health.       Primary Diagnoses: Present on Admission:  Sepsis ([place])  Atrial fibrillation with RVR ([place])  CAD (coronary artery disease) of artery bypass graft  Depression with anxiety  Spinal stenosis  Cellulitis  Hypokalemia   I have reviewed the medical record, interviewed the patient and family, and examined the patient. The following aspects are pertinent.  Past Medical History:  Diagnosis Date   Angina pectoris ([place])    Anxiety    Arthritis    CHF (congestive heart failure) ([place])     Coronary artery disease    Depression    Diabetes mellitus without complication ([place])    Patient takes Metformin.   Diverticulosis    GERD (gastroesophageal reflux disease)    Hyperlipidemia    Hypertension    Multiple myeloma ([place])    Multiple myeloma ([place]) 03/27/2015   Myocardial infarction [facility]) April 2001   widowmaker   Shortness of breath dyspnea    Sleep apnea    No CPAP @ present   Spinal stenosis    Squamous cell carcinoma of skin 02/19/2014   Left dorsal hand. WD SCC with superficial infiltration. Re-shaved 04/23/2014. Re-shaved 09/05/2014.   Squamous cell carcinoma of skin 09/08/2020   Left ant scalp. WD SCC.   Squamous cell carcinoma of skin 02/04/2021   R hand dorsum, EDC    Social History   Socioeconomic History   Marital status: Married    Spouse name: Not on file   Number of children: Not on file   Years of education: Not on file   Highest  education level: Not on file  Occupational History   Not on file  Tobacco Use   Smoking status: Former    Packs/day: 1.50    Years: 40.00    Pack years: 60.00    Types: Cigarettes    Quit date: 02/24/2000    Years since quitting: 21.8   Smokeless tobacco: Former    Quit date: 03/04/2000  Vaping Use   Vaping Use: Never used  Substance and Sexual Activity   Alcohol use: No   Drug use: No   Sexual activity: Not on file  Other Topics Concern   Not on file  Social History Narrative   Not on file   Social Determinants of Health   Financial Resource Strain: Not on file  Food Insecurity: Not on file  Transportation Needs: Not on file  Physical Activity: Not on file  Stress: Not on file  Social Connections: Not on file   Family History  Problem Relation Age of Onset   Heart disease Father    Stroke Mother    Prostate cancer Maternal Grandfather 35   Scheduled Meds:  aspirin  81 mg Oral Daily   Chlorhexidine Gluconate Cloth  6 each Topical Q0600   furosemide  40 mg Intravenous Daily   heparin  5,000 Units  Subcutaneous Q8H   insulin aspart  0-15 Units Subcutaneous TID WC   lidocaine  1 patch Transdermal Q24H   loratadine  10 mg Oral Daily   midodrine  2.5 mg Oral TID WC   multivitamin with minerals  1 tablet Oral Daily   mupirocin ointment  1 application Nasal BID   PARoxetine  10 mg Oral QHS   pravastatin  40 mg Oral q1800   propranolol  10 mg Oral BID   spironolactone  50 mg Oral Daily   Continuous Infusions:  sodium chloride Stopped (12/12/21 1350)    ceFAZolin (ANCEF) IV 2 g (12/14/21 0647)   metronidazole 500 mg (12/14/21 0022)   PRN Meds:.sodium chloride, acetaminophen **OR** acetaminophen, ALPRAZolam, ipratropium-albuterol, ondansetron **OR** ondansetron (ZOFRAN) IV Medications Prior to Admission:  Prior to Admission medications   Medication Sig Start Date End Date Taking? Authorizing Provider  ALPRAZolam (XANAX) 0.25 MG tablet Take 0.25 mg by mouth at bedtime as needed. for sleep 02/06/15  Yes [provider]  furosemide (LASIX) 40 MG tablet Take 40 mg by mouth daily. 07/30/21 07/30/22 Yes [provider]  metFORMIN (GLUCOPHAGE) 500 MG tablet Take 500 mg by mouth daily with breakfast.   Yes [provider]  metolazone (ZAROXOLYN) 2.5 MG tablet Take 2.5 mg by mouth daily. 09/21/21  Yes [provider]  VENTOLIN HFA 108 (90 Base) MCG/ACT inhaler Inhale 2 puffs into the lungs every 6 (six) hours as needed for wheezing. 10/31/21  Yes [provider]  aspirin 81 MG tablet Take 81 mg by mouth daily.    [provider]  Bee Pollen 500 MG CHEW Chew 1 tablet by mouth 2 (two) times daily.    [provider]  Calcium Carbonate-Vitamin D 600-400 MG-UNIT chew tablet Chew 1 tablet by mouth 2 (two) times daily.     [provider]  Cyanocobalamin (VITAMIN B-12 IJ) Inject 1 Dose as directed every 30 (thirty) days.  Patient not taking: Reported on 12/09/2021    [provider]  ergocalciferol (VITAMIN D2) 50000 UNITS  capsule Take 50,000 Units by mouth once a week. Monday Patient not taking: Reported on 12/09/2021    [provider]  glucose blood  test strip USE ONCE DAILY. USE AS INSTRUCTED. DX E11.9 10/04/18   [provider]  ipratropium-albuterol (DUONEB) 0.5-2.5 (3) MG/3ML SOLN Take 3 mLs by nebulization every 6 (six) hours as needed. Patient not taking: Reported on 02/05/2021 11/13/20   Enzo Bi, MD  lidocaine (LIDODERM) 5 % Place 1 patch onto the skin every 12 (twelve) hours. Remove & Discard patch within 12 hours or as directed by MD Patient not taking: Reported on 02/05/2021 01/13/21 01/13/22  Sable Feil, PA-C  losartan (COZAAR) 25 MG tablet Take 25 mg by mouth daily. 09/10/19 05/07/21  [provider]  lovastatin (MEVACOR) 40 MG tablet Take 40 mg by mouth at bedtime.     [provider]  omeprazole (PRILOSEC) 20 MG capsule Take 20 mg by mouth daily.    [provider]  PARoxetine (PAXIL) 10 MG tablet Take 10 mg by mouth daily.    [provider]  potassium chloride (KLOR-CON) 10 MEQ tablet Take 20 mEq by mouth 2 (two) times daily. 11/05/21   [provider]  propranolol (INDERAL) 10 MG tablet Hold due to low heart rate. 11/13/20   Enzo Bi, MD   Allergies  Allergen Reactions   Pravastatin Sodium Other (See Comments)   Statins Other (See Comments)    Muscle aches   Zocor [Simvastatin] Other (See Comments)    Muscle aches   Review of Systems  Unable to perform ROS: Acuity of condition   Physical Exam Vitals and nursing note reviewed.  Constitutional:      General: He is not in acute distress.    Appearance: He is ill-appearing.  Cardiovascular:     Rate and Rhythm: Normal rate.  Pulmonary:     Effort: Pulmonary effort is normal. No tachypnea.  Musculoskeletal:     Right lower leg: No edema.     Left lower leg: No edema.  Skin:    General: Skin is warm and dry.     Comments: Multiple areas of scabbing throughout   Neurological:      Mental Status: He is alert and oriented to person, place, and time.  Psychiatric:        Mood and Affect: Mood normal.        Behavior: Behavior normal.    Vital Signs: BP (!) 97/58 (BP Location: Left Arm)    Pulse (!) 51    Temp 98.3 F (36.8 C)    Resp 20    Wt 87.2 kg    SpO2 92%    BMI 30.11 kg/m  Pain Scale: 0-10 POSS *See Group Information*: 1-Acceptable,Awake and alert Pain Score: 0-No pain   SpO2: SpO2: 92 % O2 Device:SpO2: 92 % O2 Flow Rate: .O2 Flow Rate (L/min): 1 L/min  IO: Intake/output summary:  Intake/Output Summary (Last 24 hours) at 12/14/2021 1356 Last data filed at 12/13/2021 9379 Gross per 24 hour  Intake 559.69 ml  Output --  Net 559.69 ml    LBM: Last BM Date: 12/13/21 Baseline Weight: Weight: 83.5 kg Most recent weight: Weight: 87.2 kg     Palliative Assessment/Data:   Flowsheet Rows    Flowsheet Row Most Recent Value  Intake Tab   Referral Department Hospitalist  Unit at Time of Referral Intermediate Care Unit  Palliative Care Primary Diagnosis Sepsis/Infectious Disease  Date Notified 12/13/21  Palliative Care Type New Palliative care  Reason for referral Clarify Goals of Care  Date of Admission 12/09/21  Date first seen by Palliative Care 12/14/21  # of  days Palliative referral response time 1 Day(s)  # of days IP prior to Palliative referral 4  Clinical Assessment   Palliative Performance Scale Score 30%  Pain Max last 24 hours Not able to report  Pain Min Last 24 hours Not able to report  Dyspnea Max Last 24 Hours Not able to report  Dyspnea Min Last 24 hours Not able to report  Psychosocial & Spiritual Assessment   Palliative Care Outcomes        Time In: 1220 Time Out: 1335 Time Total: 75 minutes  Greater than 50%  of this time was spent counseling and coordinating care related to the above assessment and plan.  Signed by: Drue Novel, NP   Please contact Palliative Medicine Team phone at [phone] for questions and  concerns.  For individual provider: See Shea Evans

## 2021-12-14 NOTE — Progress Notes (Signed)
Request to IR for right thoracentesis - limited chest US shows very small pleural effusion that is not amenable to percutaneous drainage. Also noted was small amount of intracostal perihepatic ascites which is potentially amenable to diagnostic paracentesis although unlikely to provide any therapeutic benefit to the patient.  Discussed above with Dr. Lanney Gins who does not desire paracentesis at this time.  Limited chest [place] available for review under imaging section of Epic.   Candiss Norse, PA-C

## 2021-12-14 NOTE — Progress Notes (Addendum)
Patient had a small bowel movement. Yellow loose stools noted. Peri care and foley care performed. Full linen change. CHG bath completed and documented in [facility]  Patient is on 3L  and an external catheter is present.

## 2021-12-14 NOTE — Progress Notes (Signed)
PT Cancellation Note  Patient Details Name: Luis [name]. MRN: [id number] DOB: 16-Aug-1946   Cancelled Treatment:    Reason Eval/Treat Not Completed: Medical issues which prohibited therapy  Pt with further work-up this pm and testing for possible PE.  Will hold therapy until testing compete and continue as appropriate.   Chesley Noon 12/14/2021, 2:42 PM

## 2021-12-15 ENCOUNTER — Inpatient Hospital Stay: Payer: Self-pay

## 2021-12-15 ENCOUNTER — Inpatient Hospital Stay: Payer: Medicare Other | Attending: Radiation Oncology

## 2021-12-15 ENCOUNTER — Inpatient Hospital Stay: Payer: Medicare Other

## 2021-12-15 DIAGNOSIS — C9000 Multiple myeloma not having achieved remission: Secondary | ICD-10-CM | POA: Insufficient documentation

## 2021-12-15 DIAGNOSIS — B9561 Methicillin susceptible Staphylococcus aureus infection as the cause of diseases classified elsewhere: Secondary | ICD-10-CM | POA: Diagnosis not present

## 2021-12-15 DIAGNOSIS — R7881 Bacteremia: Secondary | ICD-10-CM | POA: Diagnosis not present

## 2021-12-15 DIAGNOSIS — N189 Chronic kidney disease, unspecified: Secondary | ICD-10-CM | POA: Diagnosis not present

## 2021-12-15 DIAGNOSIS — I4892 Unspecified atrial flutter: Secondary | ICD-10-CM

## 2021-12-15 DIAGNOSIS — N179 Acute kidney failure, unspecified: Secondary | ICD-10-CM | POA: Diagnosis not present

## 2021-12-15 DIAGNOSIS — I2510 Atherosclerotic heart disease of native coronary artery without angina pectoris: Secondary | ICD-10-CM

## 2021-12-15 DIAGNOSIS — Z515 Encounter for palliative care: Secondary | ICD-10-CM | POA: Diagnosis not present

## 2021-12-15 DIAGNOSIS — A419 Sepsis, unspecified organism: Secondary | ICD-10-CM | POA: Diagnosis not present

## 2021-12-15 DIAGNOSIS — Z7189 Other specified counseling: Secondary | ICD-10-CM | POA: Diagnosis not present

## 2021-12-15 LAB — GLUCOSE, CAPILLARY
Glucose-Capillary: 122 mg/dL — ABNORMAL HIGH (ref 70–99)
Glucose-Capillary: 125 mg/dL — ABNORMAL HIGH (ref 70–99)
Glucose-Capillary: 73 mg/dL (ref 70–99)
Glucose-Capillary: 152 mg/dL — ABNORMAL HIGH (ref 70–99)

## 2021-12-15 LAB — BASIC METABOLIC PANEL
Anion gap: 8 (ref 5–15)
BUN: 51 mg/dL — ABNORMAL HIGH (ref 8–23)
CO2: 25 mmol/L (ref 22–32)
Calcium: 7.6 mg/dL — ABNORMAL LOW (ref 8.9–10.3)
Chloride: 101 mmol/L (ref 98–111)
Creatinine, Ser: 2.27 mg/dL — ABNORMAL HIGH (ref 0.61–1.24)
GFR, Estimated: 29 mL/min — ABNORMAL LOW (ref >60)
Glucose, Bld: 117 mg/dL — ABNORMAL HIGH (ref 70–99)
Potassium: 3.8 mmol/L (ref 3.5–5.1)
Sodium: 134 mmol/L — ABNORMAL LOW (ref 135–145)

## 2021-12-15 LAB — BODY FLUID CELL COUNT WITH DIFFERENTIAL
Eos, Fluid: 0 %
Lymphs, Fluid: 17 %
Monocyte-Macrophage-Serous Fluid: 71 %
Neutrophil Count, Fluid: 12 %
Total Nucleated Cell Count, Fluid: 73 cu mm

## 2021-12-15 LAB — GLUCOSE, PLEURAL OR PERITONEAL FLUID: Glucose, Fluid: 158 mg/dL

## 2021-12-15 LAB — CBC
HCT: 32.2 % — ABNORMAL LOW (ref 39.0–52.0)
Hemoglobin: 10.9 g/dL — ABNORMAL LOW (ref 13.0–17.0)
MCH: 38.4 pg — ABNORMAL HIGH (ref 26.0–34.0)
MCHC: 33.9 g/dL (ref 30.0–36.0)
MCV: 113.4 fL — ABNORMAL HIGH (ref 80.0–100.0)
Platelets: 193 10*3/uL (ref 150–400)
RBC: 2.84 MIL/uL — ABNORMAL LOW (ref 4.22–5.81)
RDW: 17.2 % — ABNORMAL HIGH (ref 11.5–15.5)
WBC: 6.9 10*3/uL (ref 4.0–10.5)
nRBC: 0 % (ref 0.0–0.2)

## 2021-12-15 LAB — LACTATE DEHYDROGENASE, PLEURAL OR PERITONEAL FLUID: LD, Fluid: 26 U/L — ABNORMAL HIGH (ref 3–23)

## 2021-12-15 LAB — ALBUMIN, PLEURAL OR PERITONEAL FLUID: Albumin, Fluid: <1.5 g/dL

## 2021-12-15 LAB — PROTEIN, PLEURAL OR PERITONEAL FLUID: Total protein, fluid: <3 g/dL

## 2021-12-15 LAB — ALBUMIN: Albumin: 2 g/dL — ABNORMAL LOW (ref 3.5–5.0)

## 2021-12-15 MED ORDER — SODIUM CHLORIDE 0.9% FLUSH
10.0000 mL | Freq: Two times a day (BID) | INTRAVENOUS | Status: DC
  Administered 2021-12-15 – 2021-12-18 (×6): 10 mL

## 2021-12-15 MED ORDER — PROSOURCE PLUS PO LIQD
30.0000 mL | Freq: Three times a day (TID) | ORAL | Status: DC
  Administered 2021-12-15 – 2021-12-17 (×4): 30 mL via ORAL
  Filled 2021-12-15 (×10): qty 30

## 2021-12-15 MED ORDER — BOOST / RESOURCE BREEZE PO LIQD CUSTOM
1.0000 | Freq: Three times a day (TID) | ORAL | Status: DC
  Administered 2021-12-15 – 2021-12-17 (×4): 1 via ORAL

## 2021-12-15 MED ORDER — SODIUM CHLORIDE 0.9% FLUSH: 10.0000 mL | INTRAVENOUS | Status: DC | PRN

## 2021-12-15 MED ORDER — CHLORHEXIDINE GLUCONATE CLOTH 2 % EX PADS
6.0000 | MEDICATED_PAD | Freq: Every day | CUTANEOUS | Status: DC
  Administered 2021-12-16 – 2021-12-18 (×3): 6 via TOPICAL

## 2021-12-15 NOTE — Progress Notes (Signed)
PICC order received. Spoke with primary RN via secure chat, no d/c date yet. Patient has 1 working PIV. PICC will be placed tomorrow 2/8.

## 2021-12-15 NOTE — Progress Notes (Addendum)
PROGRESS NOTE    Luis [name].  KXF:[id number] DOB: 1946-10-31 DOA: 12/09/2021 PCP: Tracie Harrier, MD  Outpatient Specialists: cardiology, dermatology, oncology    Brief Narrative:   From admission h and p Luis [name]. is a 76 y.o. male with medical history significant for coronary artery disease status post CABG, history of multiple myeloma, diabetes mellitus with stage III chronic kidney disease, hypertension, anxiety and depression who presents to the ER for evaluation of worsening shortness of breath from his baseline and weakness. Shortness of breath is mostly with exertion and is occasionally associated with a cough productive of clear to green phlegm.  He denies having any fever but has had chills. Per EMS patient's room air pulse oximetry was 90 to 91% and his blood pressure was in the 80s. He has bilateral lower extremity swelling and has noted increased redness involving his lower extremities. He denies having any headache, no dizziness, no lightheadedness, no abdominal pain, no nausea, no changes in his bowel habits, no urinary symptoms, no chest pain, no blood pressure no focal deficit. Sodium 135, potassium 2.3, chloride 97, bicarb 27, glucose 106, BUN 43, creatinine 2.08, calcium 7.6, alkaline phosphatase 63, albumin 2.1, AST 41, ALT 16, total protein 5.8, troponin 38, lactic acid 5.1 >> 4.3, PT 17.7, INR 1.5 Respiratory viral panel is negative Urine analysis is stable MRSA PCR was positive Chest x-ray reviewed by me shows increased additional groundglass opacities throughout both lungs suggesting possible mild edema.  Underlying chronic pleural-parenchymal scarring. Twelve-lead EKG shows atrial flutter with 2-1 block, PVCs and a right bundle branch block.   Assessment & Plan:   Principal Problem:   Sepsis ([place]) Active Problems:   Spinal stenosis   Type 2 diabetes mellitus without complication, without long-term current use of insulin (HCC)   CAD (coronary  artery disease) of artery bypass graft   Depression with anxiety   Atrial fibrillation with RVR (HCC)   Cellulitis   Hypokalemia   Malnutrition of moderate degree   # lower extremity cellulitis # Sepsis # Bacteremia Sepsis by tachycardia, lactic acidosis. S/p 1 liter in the ED. 1/4 BCs growing s aureus. ID following. Lower extremity erythema improving. Culture pan-sensitive. Repeat blood cultures ngtd. MRI of lumbar spine showed no signs infection. - cont cefazolin - TTE w/o signs endocarditis. ID advises TEE, discussed w/ both [facility] and [facility], neither feel comfortable performing TEE given risks (severe phtn). ID advising OK to proceed w/ picc (order placed), is thinking 4 wks of IV abx.  # Acute hypoxic respiratory failure Likely multifactorial. CT shows worsening infiltrates, possible aspiration. V/Q scan on 2/6 does not show PE. Has pleural effusions but per IR they are too small to tap - added flagyl to cefazolin - cont o2 - diuresis on pause  # Goals of care - palliative medicine consult placed today. For now patient wants to "treat the treatable, nothing heroic"  # Acute kidney injury Cr 2s from baseline around 1.2. treated w/ ivf, cr stable around 2 though up - holding diuretics today  # Pulmonary hypertension Severe, on TTE 2/4. Night team treated with albumin/lasix. RA filling pressures low. Has mod pHTN. Likely some combination of types 2 and 3 pHTN. Pulmonology consulted, has signed off. - i've consulted cardiology for further evaluation  # Pleural effusions Per IR too small to tap  # Cirrhosis # Ascites CT of chest shows cirrhotic liver, ascites. This appears to be a new dx. No SBP symptoms. Does have history heavy  drinking. u/s liver w/ doppler ordered, consistent w/ cirrhosis, no clot or suspicious masses. Paracentesis today yielding 2.7 L serous fluid - diuretics on pause given aki - f/u pleural fluid culture, cytology, labs  #  Dysphagia Esophagram on 2/3  shows "corkscrewing" of the esophagus, possible stricture, marked dysmotility. - SLP notes sig aspiration risk - will need close GI f/u at d/c  # Chronic diarrhea Established w/ GI last month. GI pathogen panel here is negative.  # Hypokalemia # Hypomagenesemia Resolved - monitor  # dCHF # CAD s/p bypass graft No signs/symptoms ACS. Bnp markedly elevated on presentation. Patient reports lower extremity swelling is mild and improved from baseline so initially treated with fluids. TTE with mild/mod mitral regurg, severely reduced right ventricular function - diuretics on pause today  # A fib with rvr RVR resolved. Not angicoagulated 2/2 frequent falls - cont home propranolol  # HTN Here bp wnl - hold home losartan, cont propranolol   # Multiple myeloma - followed by onc, not currently treated  # T7 compression fracture 2/2 MM.  - pain control     DVT prophylaxis: heparin Code Status: DNR, confirmed w/ son and patient 2/3 Family Communication: son Aaron Edelman updated telephonically 2/7  Level of care: Progressive Status is: Inpatient Remains inpatient appropriate because: severity of illness D/c plan: return to assisted living with outpatient hospice. Son Aaron Edelman is working to get nursing support in place    Consultants:  ID, pulm, palliative  Procedures: none  Antimicrobials:  Vanc/cefepime>cefazolin  Flagyl added 2/5   Subjective: Says breathing and anxiety somewhat improved, no pain, working with PT  Objective: Vitals:   12/15/21 0700 12/15/21 0756 12/15/21 1025 12/15/21 1110  BP:  104/62 (!) 105/50 (!) 102/53  Pulse:  (!) 52 (!) 57 (!) 56  Resp:      Temp:  98.2 F (36.8 C)    TempSrc:  Oral    SpO2:  98% 96% 96%  Weight: 88 kg     Height:        Intake/Output Summary (Last 24 hours) at 12/15/2021 1330 Last data filed at 12/14/2021 2200 Gross per 24 hour  Intake --  Output 200 ml  Net -200 ml   Filed Weights   12/14/21 0400 12/14/21 2000 12/15/21  0700  Weight: 87.2 kg 87.2 kg 88 kg    Examination:  General exam: Appears calm and comfortable  Respiratory system: rales at bases, scattered rhonchi and wheeze Cardiovascular system: S1 & S2 heard, irreg irreg, soft systolic murmur No JVD, murmurs, rubs, gallops or clicks.  Gastrointestinal system: Abdomen is nondistended, soft and nontender. No organomegaly or masses felt. Normal bowel sounds heard. Central nervous system: Alert and oriented. No focal neurological deficits. Extremities: Symmetric 5 x 5 power. Skin: inerval mild impreovement in lower extremity erythema. Scabbs present. Ulcers on scalp bandaged Psychiatry: Judgement and insight appear normal. Mood & affect appropriate.     Data Reviewed: I have personally reviewed following labs and imaging studies  CBC: Recent Labs  Lab 12/09/21 1056 12/10/21 0621 12/11/21 0950 12/12/21 0414 12/13/21 0544 12/14/21 0409 12/15/21 0537  WBC 10.8*   < > 8.9 6.7 5.8 6.8 6.9  NEUTROABS 9.3*  --   --   --   --   --   --   HGB 11.5*   < > 11.4* 10.8* 10.3* 11.2* 10.9*  HCT 33.1*   < > 33.2* 32.2* 29.9* 33.2* 32.2*  MCV 110.0*   < > 109.2* 111.0* 108.7* 110.7*  113.4*  PLT 149*   < > 156 172 157 188 193   < > = values in this interval not displayed.   Basic Metabolic Panel: Recent Labs  Lab 12/09/21 1305 12/10/21 0621 12/10/21 2125 12/11/21 0950 12/12/21 0414 12/13/21 0544 12/14/21 0409 12/15/21 0537  NA 135 132*  --  131* 133* 133* 131* 134*  K 2.3* 2.9*  --  4.4 4.3 3.5 3.8 3.8  CL 97* 96*  --  99 101 103 101 101  CO2 27 27  --  '23 23 23 24 25  ' GLUCOSE 106* 117*  --  124* 101* 115* 102* 117*  BUN 43* 51*  --  50* 45* 45* 47* 51*  CREATININE 2.08* 2.16*  --  2.07* 1.97* 2.12* 2.22* 2.27*  CALCIUM 7.6* 7.4*  --  7.3* 7.3* 7.4* 7.5* 7.6*  MG 1.4* 2.5* 2.3 2.3 2.2  --   --   --    GFR: Estimated Creatinine Clearance: 28.9 mL/min (A) (by C-G formula based on SCr of 2.27 mg/dL (H)). Liver Function Tests: Recent Labs   Lab 12/09/21 1305 12/15/21 0537  AST 41  --   ALT 16  --   ALKPHOS 63  --   BILITOT 1.8*  --   PROT 5.8*  --   ALBUMIN 2.1* 2.0*   No results for input(s): LIPASE, AMYLASE in the last 168 hours. No results for input(s): AMMONIA in the last 168 hours. Coagulation Profile: Recent Labs  Lab 12/09/21 1208 12/10/21 0621  INR 1.5* 1.5*   Cardiac Enzymes: No results for input(s): CKTOTAL, CKMB, CKMBINDEX, TROPONINI in the last 168 hours. BNP (last 3 results) No results for input(s): PROBNP in the last 8760 hours. HbA1C: No results for input(s): HGBA1C in the last 72 hours.  CBG: Recent Labs  Lab 12/14/21 1645 12/14/21 1651 12/14/21 2100 12/15/21 0758 12/15/21 1327  GLUCAP 67* 106* 125* 122* 125*   Lipid Profile: No results for input(s): CHOL, HDL, LDLCALC, TRIG, CHOLHDL, LDLDIRECT in the last 72 hours. Thyroid Function Tests: No results for input(s): TSH, T4TOTAL, FREET4, T3FREE, THYROIDAB in the last 72 hours. Anemia Panel: No results for input(s): VITAMINB12, FOLATE, FERRITIN, TIBC, IRON, RETICCTPCT in the last 72 hours. Urine analysis:    Component Value Date/Time   COLORURINE YELLOW 12/09/2021 1119   APPEARANCEUR CLEAR (A) 12/09/2021 1119   APPEARANCEUR Clear 08/07/2013 0102   LABSPEC 1.015 12/09/2021 1119   LABSPEC 1.023 08/07/2013 0102   PHURINE 5.0 12/09/2021 1119   GLUCOSEU NEGATIVE 12/09/2021 1119   GLUCOSEU 50 mg/dL 08/07/2013 0102   HGBUR NEGATIVE 12/09/2021 1119   BILIRUBINUR NEGATIVE 12/09/2021 1119   BILIRUBINUR Negative 08/07/2013 0102   KETONESUR TRACE (A) 12/09/2021 1119   PROTEINUR TRACE (A) 12/09/2021 1119   NITRITE NEGATIVE 12/09/2021 1119   LEUKOCYTESUR NEGATIVE 12/09/2021 1119   LEUKOCYTESUR Negative 08/07/2013 0102   Sepsis Labs: '@LABRCNTIP' (procalcitonin:4,lacticidven:4)  ) Recent Results (from the past 240 hour(s))  Blood Culture (routine x 2)     Status: Abnormal   Collection Time: 12/09/21 10:57 AM   Specimen: BLOOD  Result  Value Ref Range Status   Specimen Description   Final    BLOOD LEFT WRIST Performed at [facility], [address], [place], [place] [address]    Special Requests   Final    BOTTLES DRAWN AEROBIC AND ANAEROBIC Blood Culture adequate volume Performed at [facility], [address] [place], [place] [address]    Culture  Setup Time   Final  GRAM POSITIVE COCCI AEROBIC BOTTLE ONLY CRITICAL RESULT CALLED TO, READ BACK BY AND VERIFIED WITH: JASON ROBBINS @ 0149 ON 12/10/21.Marland KitchenMarland KitchenTKR Performed at [facility], [place] [address], [place], [place] [address]    Culture STAPHYLOCOCCUS AUREUS (A)  Final   Report Status 12/12/2021 FINAL  Final   Organism ID, Bacteria STAPHYLOCOCCUS AUREUS  Final      Susceptibility   Staphylococcus aureus - MIC*    CIPROFLOXACIN <=0.5 SENSITIVE Sensitive     ERYTHROMYCIN <=0.25 SENSITIVE Sensitive     GENTAMICIN <=0.5 SENSITIVE Sensitive     OXACILLIN <=0.25 SENSITIVE Sensitive     TETRACYCLINE <=1 SENSITIVE Sensitive     VANCOMYCIN <=0.5 SENSITIVE Sensitive     TRIMETH/SULFA <=10 SENSITIVE Sensitive     CLINDAMYCIN <=0.25 SENSITIVE Sensitive     RIFAMPIN <=0.5 SENSITIVE Sensitive     Inducible Clindamycin NEGATIVE Sensitive     * STAPHYLOCOCCUS AUREUS  Blood Culture ID Panel (Reflexed)     Status: Abnormal   Collection Time: 12/09/21 10:57 AM  Result Value Ref Range Status   Enterococcus faecalis NOT DETECTED NOT DETECTED Final   Enterococcus Faecium NOT DETECTED NOT DETECTED Final   Listeria monocytogenes NOT DETECTED NOT DETECTED Final   Staphylococcus species DETECTED (A) NOT DETECTED Final    Comment: CRITICAL RESULT CALLED TO, READ BACK BY AND VERIFIED WITH: JASON ROBBINS @ 0149 ON 12/10/21.Marland KitchenMarland KitchenTKR    Staphylococcus aureus (BCID) DETECTED (A) NOT DETECTED Final    Comment: CRITICAL RESULT CALLED TO, READ BACK BY AND VERIFIED WITH: JASON ROBBINS @ 0149 ON 12/10/21.Marland KitchenMarland KitchenTKR    Staphylococcus epidermidis NOT DETECTED NOT DETECTED  Final   Staphylococcus lugdunensis NOT DETECTED NOT DETECTED Final   Streptococcus species NOT DETECTED NOT DETECTED Final   Streptococcus agalactiae NOT DETECTED NOT DETECTED Final   Streptococcus pneumoniae NOT DETECTED NOT DETECTED Final   Streptococcus pyogenes NOT DETECTED NOT DETECTED Final   A.calcoaceticus-baumannii NOT DETECTED NOT DETECTED Final   Bacteroides fragilis NOT DETECTED NOT DETECTED Final   Enterobacterales NOT DETECTED NOT DETECTED Final   Enterobacter cloacae complex NOT DETECTED NOT DETECTED Final   Escherichia coli NOT DETECTED NOT DETECTED Final   Klebsiella aerogenes NOT DETECTED NOT DETECTED Final   Klebsiella oxytoca NOT DETECTED NOT DETECTED Final   Klebsiella pneumoniae NOT DETECTED NOT DETECTED Final   Proteus species NOT DETECTED NOT DETECTED Final   Salmonella species NOT DETECTED NOT DETECTED Final   Serratia marcescens NOT DETECTED NOT DETECTED Final   Haemophilus influenzae NOT DETECTED NOT DETECTED Final   Neisseria meningitidis NOT DETECTED NOT DETECTED Final   Pseudomonas aeruginosa NOT DETECTED NOT DETECTED Final   Stenotrophomonas maltophilia NOT DETECTED NOT DETECTED Final   Candida albicans NOT DETECTED NOT DETECTED Final   Candida auris NOT DETECTED NOT DETECTED Final   Candida glabrata NOT DETECTED NOT DETECTED Final   Candida krusei NOT DETECTED NOT DETECTED Final   Candida parapsilosis NOT DETECTED NOT DETECTED Final   Candida tropicalis NOT DETECTED NOT DETECTED Final   Cryptococcus neoformans/gattii NOT DETECTED NOT DETECTED Final   Meth resistant mecA/C and MREJ NOT DETECTED NOT DETECTED Final    Comment: Performed at [facility], [address] [place], [place] [address]  Blood Culture (routine x 2)     Status: None   Collection Time: 12/09/21 10:59 AM   Specimen: BLOOD  Result Value Ref Range Status   Specimen Description BLOOD RIGHT [facility]  Final   Special Requests   Final  BOTTLES DRAWN AEROBIC AND ANAEROBIC Blood  Culture adequate volume   Culture   Final    NO GROWTH 5 DAYS Performed at [facility], [address] [place], [place] [address]    Report Status 12/14/2021 FINAL  Final  Urine Culture     Status: Abnormal   Collection Time: 12/09/21 11:19 AM   Specimen: In/Out Cath Urine  Result Value Ref Range Status   Specimen Description   Final    IN/OUT CATH URINE Performed at [facility], [address] [place], [place] [address]    Special Requests   Final    NONE Performed at [facility], [place], [place] [address]    Culture 1,000 COLONIES/mL ENTEROCOCCUS FAECALIS (A)  Final   Report Status 12/12/2021 FINAL  Final   Organism ID, Bacteria ENTEROCOCCUS FAECALIS (A)  Final      Susceptibility   Enterococcus faecalis - MIC*    AMPICILLIN <=2 SENSITIVE Sensitive     NITROFURANTOIN <=16 SENSITIVE Sensitive     VANCOMYCIN 1 SENSITIVE Sensitive     * 1,000 COLONIES/mL ENTEROCOCCUS FAECALIS  Resp Panel by RT-PCR (Flu A&B, Covid) Nasopharyngeal Swab     Status: None   Collection Time: 12/09/21 11:19 AM   Specimen: Nasopharyngeal Swab; Nasopharyngeal(NP) swabs in vial transport medium  Result Value Ref Range Status   SARS Coronavirus 2 by RT PCR NEGATIVE NEGATIVE Final    Comment: (NOTE) SARS-CoV-2 target nucleic acids are NOT DETECTED.  The SARS-CoV-2 RNA is generally detectable in upper respiratory specimens during the acute phase of infection. The lowest concentration of SARS-CoV-2 viral copies this assay can detect is 138 copies/mL. A negative result does not preclude SARS-Cov-2 infection and should not be used as the sole basis for treatment or other patient management decisions. A negative result may occur with  improper specimen collection/handling, submission of specimen other than nasopharyngeal swab, presence of viral mutation(s) within the areas targeted by this assay, and inadequate number of viral copies(<138 copies/mL). A  negative result must be combined with clinical observations, patient history, and epidemiological information. The expected result is Negative.  Fact Sheet for Patients:  EntrepreneurPulse.com.au  Fact Sheet for Healthcare Providers:  IncredibleEmployment.be  This test is no t yet approved or cleared by the [place] FDA and  has been authorized for detection and/or diagnosis of SARS-CoV-2 by FDA under an Emergency Use Authorization (EUA). This EUA will remain  in effect (meaning this test can be used) for the duration of the COVID-19 declaration under Section 564(b)(1) of the Act, 21 U.S.C.section 360bbb-3(b)(1), unless the authorization is terminated  or revoked sooner.       Influenza A by PCR NEGATIVE NEGATIVE Final   Influenza B by PCR NEGATIVE NEGATIVE Final    Comment: (NOTE) The Xpert Xpress SARS-CoV-2/FLU/RSV plus assay is intended as an aid in the diagnosis of influenza from Nasopharyngeal swab specimens and should not be used as a sole basis for treatment. Nasal washings and aspirates are unacceptable for Xpert Xpress SARS-CoV-2/FLU/RSV testing.  Fact Sheet for Patients: EntrepreneurPulse.com.au  Fact Sheet for Healthcare Providers: IncredibleEmployment.be  This test is not yet approved or cleared by the [place] FDA and has been authorized for detection and/or diagnosis of SARS-CoV-2 by FDA under an Emergency Use Authorization (EUA). This EUA will remain in effect (meaning this test can be used) for the duration of the COVID-19 declaration under Section 564(b)(1) of the Act, 21 U.S.C. section 360bbb-3(b)(1), unless the authorization  is terminated or revoked.  Performed at [facility], [address] [place], [place] [address]   MRSA Next Gen by PCR, Nasal     Status: Abnormal   Collection Time: 12/09/21  1:24 PM   Specimen: Nasal Mucosa; Nasal Swab  Result Value Ref  Range Status   MRSA by PCR Next Gen DETECTED (A) NOT DETECTED Final    Comment: RESULT CALLED TO, READ BACK BY AND VERIFIED WITH: JON COOK 1525 12/09/21 MU (NOTE) The GeneXpert MRSA Assay (FDA approved for NASAL specimens only), is one component of a comprehensive MRSA colonization surveillance program. It is not intended to diagnose MRSA infection nor to guide or monitor treatment for MRSA infections. Test performance is not FDA approved in patients less than 78 years old. Performed at [facility], [address] [place], [place] [address]   Aerobic Culture w Gram Stain (superficial specimen)     Status: None   Collection Time: 12/10/21  5:31 PM   Specimen: SCALP; Wound  Result Value Ref Range Status   Specimen Description   Final    SCALP Performed at [facility], [address] [place], [place] [address]    Special Requests   Final    NONE Performed at [facility], [address] [place], [place] [address]    Gram Stain   Final    FEW WBC PRESENT, PREDOMINANTLY MONONUCLEAR MODERATE GRAM POSITIVE COCCI Performed at [facility], [place] [address], [place], [place] [address]    Culture ABUNDANT STAPHYLOCOCCUS AUREUS  Final   Report Status 12/14/2021 FINAL  Final   Organism ID, Bacteria STAPHYLOCOCCUS AUREUS  Final      Susceptibility   Staphylococcus aureus - MIC*    CIPROFLOXACIN <=0.5 SENSITIVE Sensitive     ERYTHROMYCIN <=0.25 SENSITIVE Sensitive     GENTAMICIN <=0.5 SENSITIVE Sensitive     OXACILLIN 0.5 SENSITIVE Sensitive     TETRACYCLINE <=1 SENSITIVE Sensitive     VANCOMYCIN <=0.5 SENSITIVE Sensitive     TRIMETH/SULFA <=10 SENSITIVE Sensitive     CLINDAMYCIN <=0.25 SENSITIVE Sensitive     RIFAMPIN <=0.5 SENSITIVE Sensitive     Inducible Clindamycin NEGATIVE Sensitive     * ABUNDANT STAPHYLOCOCCUS AUREUS  CULTURE, BLOOD (ROUTINE X 2) w Reflex to ID Panel     Status: None (Preliminary result)   Collection Time: 12/11/21  6:59  AM   Specimen: BLOOD  Result Value Ref Range Status   Specimen Description BLOOD LEFT WRIST  Final   Special Requests   Final    BOTTLES DRAWN AEROBIC AND ANAEROBIC Blood Culture adequate volume   Culture   Final    NO GROWTH 3 DAYS Performed at [facility], [address], [place], [place] [address]    Report Status PENDING  Incomplete  CULTURE, BLOOD (ROUTINE X 2) w Reflex to ID Panel     Status: None (Preliminary result)   Collection Time: 12/11/21  7:16 AM   Specimen: BLOOD  Result Value Ref Range Status   Specimen Description BLOOD LEFT ARM  Final   Special Requests   Final    BOTTLES DRAWN AEROBIC AND ANAEROBIC Blood Culture adequate volume   Culture   Final    NO GROWTH 3 DAYS Performed at [facility], [address] [place], [place] [address]    Report Status PENDING  Incomplete  Gastrointestinal Panel by PCR , Stool     Status: None   Collection Time: 12/13/21  12:15 PM   Specimen: Stool  Result Value Ref Range Status   Campylobacter species NOT DETECTED NOT DETECTED Final   Plesimonas shigelloides NOT DETECTED NOT DETECTED Final   Salmonella species NOT DETECTED NOT DETECTED Final   Yersinia enterocolitica NOT DETECTED NOT DETECTED Final   Vibrio species NOT DETECTED NOT DETECTED Final   Vibrio cholerae NOT DETECTED NOT DETECTED Final   Enteroaggregative E coli (EAEC) NOT DETECTED NOT DETECTED Final   Enteropathogenic E coli (EPEC) NOT DETECTED NOT DETECTED Final   Enterotoxigenic E coli (ETEC) NOT DETECTED NOT DETECTED Final   Shiga like toxin producing E coli (STEC) NOT DETECTED NOT DETECTED Final   Shigella/Enteroinvasive E coli (EIEC) NOT DETECTED NOT DETECTED Final   Cryptosporidium NOT DETECTED NOT DETECTED Final   Cyclospora cayetanensis NOT DETECTED NOT DETECTED Final   Entamoeba histolytica NOT DETECTED NOT DETECTED Final   Giardia lamblia NOT DETECTED NOT DETECTED Final   Adenovirus F40/41 NOT DETECTED NOT DETECTED Final    Astrovirus NOT DETECTED NOT DETECTED Final   Norovirus GI/GII NOT DETECTED NOT DETECTED Final   Rotavirus A NOT DETECTED NOT DETECTED Final   Sapovirus (I, II, IV, and V) NOT DETECTED NOT DETECTED Final    Comment: Performed at [facility], [address], [place], [place] [address]         Radiology Studies: NM Pulmonary Perfusion  Result Date: 12/14/2021 CLINICAL DATA:  History of DVT, multiple falls, right pleural effusion EXAM: NUCLEAR MEDICINE PERFUSION LUNG SCAN TECHNIQUE: Perfusion images were obtained in multiple projections after intravenous injection of radiopharmaceutical. Ventilation scans intentionally deferred if perfusion scan and chest x-ray adequate for interpretation during COVID 19 epidemic. RADIOPHARMACEUTICALS:  4.09 mCi Tc-40mMAA IV COMPARISON:  12/14/2021 FINDINGS: Planar images of the lungs demonstrates heterogeneous radiotracer uptake throughout the pulmonary parenchyma. There are no wedge-shaped perfusion defects to suggest pulmonary embolus. Heterogeneous decreased uptake in the upper lobes consistent with known emphysema. Heterogeneous areas of decreased uptake within the lower lobes consistent with atelectasis on recent CT and x-ray. IMPRESSION: 1. By PISAPED criteria, no evidence of pulmonary embolus. No wedge-shaped perfusion defects. Heterogeneous uptake throughout the lungs consistent with emphysema and atelectasis as seen on recent CT. Electronically Signed   By: MRanda NgoM.D.   On: 12/14/2021 15:00   [place]ST (PLEURAL EFFUSION)  Result Date: 12/14/2021 CLINICAL DATA:  Pleural effusion EXAM: CHEST ULTRASOUND COMPARISON:  None. FINDINGS: Limited ultrasound exam of the right chest was performed. Images demonstrate a small pleural effusion, not amenable for safe image guided thoracentesis. No thoracentesis performed. IMPRESSION: Small right pleural effusion.  No thoracentesis performed. Electronically Signed   By: YAlbin FellingM.D.   On: 12/14/2021  15:45   [place]oreaParacentesis  Result Date: 12/15/2021 INDICATION: Patient with ascites presents today for a diagnostic and therapeutic paracentesis. EXAM: ULTRASOUND GUIDED PARACENTESIS MEDICATIONS: 1% lidocaine 10 mL COMPLICATIONS: None immediate. PROCEDURE: Informed written consent was obtained from the patient after a discussion of the risks, benefits and alternatives to treatment. A timeout was performed prior to the initiation of the procedure. Initial ultrasound scanning demonstrates a large amount of ascites within the right lower abdominal quadrant. The right lower abdomen was prepped and draped in the usual sterile fashion. 1% lidocaine was used for local anesthesia. Following this, a 19 gauge, 7-cm, Yueh catheter was introduced. An ultrasound image was saved for documentation purposes. The paracentesis was performed. The catheter was removed and a dressing was applied. The patient tolerated the procedure well without  immediate post procedural complication. FINDINGS: A total of approximately 2.7 L of clear yellow fluid was removed. Samples were sent to the laboratory as requested by the clinical team. IMPRESSION: Successful ultrasound-guided paracentesis yielding 2.7 liters of peritoneal fluid. Read by: Soyla Dryer, NP Electronically Signed   By: Jerilynn Mages.  Shick M.D.   On: 12/15/2021 13:14   DG Chest Port 1 View  Result Date: 12/14/2021 CLINICAL DATA:  Right pleural effusion. EXAM: PORTABLE CHEST 1 VIEW COMPARISON:  CT chest dated December 12, 2021 FINDINGS: The heart is enlarged with evidence of prior coronary artery bypass grafting. Emphysematous changes of the lungs. Left pleural thickening and scarring is again noted. There also multiple small bilateral opacities, likely scarring/atelectasis. Small right pleural effusion with atelectasis is unchanged. Osteopenia and degenerative changes of the thoracic spine. IMPRESSION: 1.  Stable cardiomegaly. 2. Emphysematous changes with multifocal bilateral  atelectasis/scarring. Stable pleural thickening in the left upper lobe as well as in the mid left lung. Stable appearance of the small right pleural effusion with right basilar atelectasis. Electronically Signed   By: Keane Police D.O.   On: 12/14/2021 10:35   US LIVER DOPPLER  Result Date: 12/14/2021 CLINICAL DATA:  Cirrhosis.  Evaluate hepatic vasculature. EXAM: DUPLEX ULTRASOUND OF LIVER TECHNIQUE: Color and duplex Doppler ultrasound was performed to evaluate the hepatic in-flow and out-flow vessels. COMPARISON:  Chest CT - 12/12/2021 FINDINGS: There is diffuse increased slightly coarsened echogenicity of the hepatic parenchyma with nodularity hepatic contour compatible with clinical suspicion of cirrhosis. No discrete hepatic lesions. No intrahepatic biliary ductal dilatation. Portal Vein Velocities (normal hepatopetal directional flow) Main:  17 cm/sec Right:  7 cm/sec Left:  11 cm/sec Hepatic Vein Velocities (normal hepatofugal directional flow) Right:  29 cm/sec Middle:  20 cm/sec Left:  9 cm/sec Hepatic Artery Velocity:  162 cm/sec Splenic Vein Velocity:  11 cm/sec Spleen: Normal in size measuring 12.0 x 12.0 x 3.0 cm with calculated volume of 245 cc. Varices: None visualized Ascites: Small to moderate amount of intra-abdominal ascites. IMPRESSION: 1. Findings suggestive hepatic cirrhosis. No discrete hepatic lesions though further evaluation could be performed with nonemergent either cirrhotic protocol CT or (preferably) abdominal MRI as indicated. 2. Patent hepatic vasculature with normal directional flow. 3. Small to moderate amount of intra-abdominal ascites without evidence of splenomegaly. Electronically Signed   By: Sandi Mariscal M.D.   On: 12/14/2021 10:26        Scheduled Meds:  aspirin  81 mg Oral Daily   heparin  5,000 Units Subcutaneous Q8H   insulin aspart  0-15 Units Subcutaneous TID WC   lidocaine  1 patch Transdermal Q24H   loratadine  10 mg Oral Daily   midodrine  2.5 mg Oral  TID WC   multivitamin with minerals  1 tablet Oral Daily   mupirocin ointment  1 application Nasal BID   PARoxetine  10 mg Oral QHS   pravastatin  40 mg Oral q1800   propranolol  10 mg Oral BID   Continuous Infusions:  sodium chloride Stopped (12/12/21 1350)    ceFAZolin (ANCEF) IV 2 g (12/15/21 6659)   metronidazole 500 mg (12/15/21 0048)     LOS: 6 days    Time spent: 54 min    Desma Maxim, MD [facility]   If 7PM-7AM, please contact night-coverage [email or web address] Password [email or web address] 12/15/2021, 1:30 PM

## 2021-12-15 NOTE — Progress Notes (Signed)
Occupational Therapy Treatment Patient Details Name: Luis [name]. MRN: [id number] DOB: [birth year] Today's Date: 12/15/2021   History of present illness The pt is a 76 y/o male who presented to ER for worsening SOB and weakness. Per chart EMS found pt room air pulse ox to be 90-91% and BP in 80s. Pt MRSA PCR was positive. Pt chest XRAY showed increased additional groundglass opacities throughout both lungs suggestive of possible mild edema. Underlying chronic pleural-parenchymal scarring. Pt with bilateral lower extremity cellulitis and pt also found to have atrial flutter with 2-1 block, PVCs and right bundle branch block. PMH significant for CAD status post CABG, multiple myeloma, diabetes mellitus with stage III chronic kidney disease, HTN, anxiety and depression. Pt is a resident at [facility].   OT comments  Pt seen for OT treatment on this date. Upon arrival to room, pt awake and sitting upright in bed following return to room s/p paracentesis. Pt was observed to be soiled in stool and was agreeable to assist from this author for clean-up. Pt required MOD A for bed mobility, MIN GUARD for sit>stand transfer from bed, and MIN GUARD to walk to [facility]. After having another BM, pt required MOD A for posterior peri-care and management of LB clothing, SET-UP assist for seated hand hygiene, and MIN A for UB dressing d/t decreased activity tolerance and strength. At end of session, pt returned to bed, with all needs within reach and in no acute distress. Pt is making good progress toward goals and continues to benefit from skilled OT services to maximize return to PLOF and minimize risk of future falls, injury, caregiver burden, and readmission. Will continue to follow POC. Discharge recommendation remains appropriate.     Recommendations for follow up therapy are one component of a multi-disciplinary discharge planning process, led by the attending physician.  Recommendations may be updated based on  patient status, additional functional criteria and insurance authorization.    Follow Up Recommendations  Skilled nursing-short term rehab (<3 hours/day)    Assistance Recommended at Discharge Frequent or constant Supervision/Assistance  Patient can return home with the following  A little help with walking and/or transfers;A lot of help with bathing/dressing/bathroom   Equipment Recommendations  Other (comment) (defer to next venue of care)       Precautions / Restrictions Precautions Precautions: Fall Restrictions Weight Bearing Restrictions: No       Mobility Bed Mobility Overal bed mobility: Needs Assistance Bed Mobility: Supine to Sit, Sit to Supine     Supine to sit: Mod assist, HOB elevated Sit to supine: Mod assist   General bed mobility comments: Requires MOD A for bringing trunk upright during supine>sit. Requires MOD A for managing LE during sit>supine    Transfers Overall transfer level: Needs assistance Equipment used: Rolling walker (2 wheels) Transfers: Sit to/from Stand Sit to Stand: Min guard           General transfer comment: Requires MIN GUARD from EOB and BSC (with trunk rocking for momentum and with use of arm rests). Requires verbal cues for safe hand placement with RW use     Balance Overall balance assessment: Needs assistance Sitting-balance support: No upper extremity supported, Feet supported Sitting balance-Leahy Scale: Good Sitting balance - Comments: good sitting balance at EOB reaching within BOS   Standing balance support: Single extremity supported, During functional activity Standing balance-Leahy Scale: Fair Standing balance comment: requires MIN GUARD for standing balance during peri-care  ADL either performed or assessed with clinical judgement   ADL Overall ADL's : Needs assistance/impaired     Grooming: Wash/dry hands;Set up;Sitting           Upper Body Dressing : Minimal  assistance;Sitting Upper Body Dressing Details (indicate cue type and reason): to don/doff hospital gown Lower Body Dressing: Maximal assistance;Sitting/lateral leans Lower Body Dressing Details (indicate cue type and reason): to don/doff briefs and to don socks (typically uses sock aide at home) Toilet Transfer: Min guard;Ambulation;Rolling walker (2 wheels);BSC/3in1   Toileting- Clothing Manipulation and Hygiene: Moderate assistance;Sit to/from stand Toileting - Clothing Manipulation Details (indicate cue type and reason): Pt able to initiate posterior peri-care, however requires MOD A to ensure thorougness in setting of pt becoming fatigued in standing     Functional mobility during ADLs: Min guard;Rolling walker (2 wheels)        Cognition Arousal/Alertness: Awake/alert Behavior During Therapy: WFL for tasks assessed/performed Overall Cognitive Status: Within Functional Limits for tasks assessed                                 General Comments: Pleasant and agreeable throughout.                   Pertinent Vitals/ Pain       Pain Assessment Pain Assessment: Faces Faces Pain Scale: Hurts little more Pain Location: bottom Pain Descriptors / Indicators: Aching Pain Intervention(s): Limited activity within patient's tolerance, Monitored during session         Frequency  Min 2X/week        Progress Toward Goals  OT Goals(current goals can now be found in the care plan section)  Progress towards OT goals: Progressing toward goals  Acute Rehab OT Goals Patient Stated Goal: to regain independence OT Goal Formulation: With patient Time For Goal Achievement: 12/27/21 Potential to Achieve Goals: Good  Plan Discharge plan remains appropriate;Frequency remains appropriate       AM-PAC OT "6 Clicks" Daily Activity     Outcome Measure   Help from another person eating meals?: None Help from another person taking care of personal grooming?: A  Little Help from another person toileting, which includes using toliet, bedpan, or urinal?: A Lot Help from another person bathing (including washing, rinsing, drying)?: A Lot Help from another person to put on and taking off regular upper body clothing?: A Little Help from another person to put on and taking off regular lower body clothing?: A Lot 6 Click Score: 16    End of Session Equipment Utilized During Treatment: Rolling walker (2 wheels);Oxygen  OT Visit Diagnosis: Unsteadiness on feet (R26.81);Muscle weakness (generalized) (M62.81);History of falling (Z91.81)   Activity Tolerance Patient tolerated treatment well   Patient Left in bed;with call bell/phone within reach;with bed alarm set   Nurse Communication Mobility status        Time: 5929-2446 OT Time Calculation (min): 44 min  Charges: OT General Charges $OT Visit: 1 Visit OT Treatments $Self Care/Home Management : 38-52 mins  Fredirick Maudlin, OTR/L [place]

## 2021-12-15 NOTE — Progress Notes (Signed)
PT Cancellation Note  Patient Details Name: Luis [name]. MRN: [id number] DOB: 07/28/1946   Cancelled Treatment:    Reason Eval/Treat Not Completed: Patient at procedure or test/unavailable. Attempt made earlier this a.m. to see pt. Per RN pt off floor for procedure. PT to re-attempt as able at a later time and date.    Salem Caster. Fairly IV, PT, DPT Physical Therapist- [facility]  12/15/2021, 12:15 PM

## 2021-12-15 NOTE — Progress Notes (Signed)
Peripherally Inserted Central Catheter Placement  The IV Nurse has discussed with the patient and/or persons authorized to consent for the patient, the purpose of this procedure and the potential benefits and risks involved with this procedure.  The benefits include less needle sticks, lab draws from the catheter, and the patient may be discharged home with the catheter. Risks include, but not limited to, infection, bleeding, blood clot (thrombus formation), and puncture of an artery; nerve damage and irregular heartbeat and possibility to perform a PICC exchange if needed/ordered by physician.  Alternatives to this procedure were also discussed.  Bard Power PICC patient education guide, fact sheet on infection prevention and patient information card has been provided to patient /or left at bedside.    PICC Placement Documentation  PICC Single Lumen 12/15/21 Right Brachial 37 cm 0 cm (Active)  Indication for Insertion or Continuance of Line Home intravenous therapies (PICC only) 12/15/21 2240  Exposed Catheter (cm) 0 cm 12/15/21 2240  Site Assessment Clean;Dry;Intact 12/15/21 2240  Line Status Flushed;Saline locked;Blood return noted 12/15/21 2240  Dressing Type Transparent;Securing device 12/15/21 2240  Dressing Status Clean;Dry;Intact 12/15/21 2240  Antimicrobial disc in place? Yes 12/15/21 2240  Safety Lock Not Applicable 01/56/15 3794  Dressing Change Due 12/22/21 12/15/21 [place] 12/15/2021, 10:47 PM

## 2021-12-15 NOTE — Progress Notes (Signed)
Nutrition Follow-up  DOCUMENTATION CODES:   Non-severe (moderate) malnutrition in context of chronic illness  INTERVENTION:   -MVI with minerals daily -Boost Breeze po TID, each supplement provides 250 kcal and 9 grams of protein  -30 ml Prosource Plus TID, each supplement provides 100 kcals and 15 grams protein -D/c Mighty Shake   NUTRITION DIAGNOSIS:   Moderate Malnutrition related to chronic illness (CAD) as evidenced by moderate fat depletion, moderate muscle depletion.  Ongoing  GOAL:   Patient will meet greater than or equal to 90% of their needs  Progressing   MONITOR:   PO intake, Supplement acceptance, Diet advancement, Labs, Weight trends, Skin, I & O's  REASON FOR ASSESSMENT:   Malnutrition Screening Tool    ASSESSMENT:   76 yo male with a PMH of CAD s/p CABG, multiple myeloma, T2DM, CKD stage 3, HTN, anxiety, and depression who presents to the ER for evaluation of worsening shortness of breath from his baseline and weakness. Admitted with sepsis.  2/4- s/p BSE- recommended mechanical soft diet  2/7- s/p rt paracentesis (2.7 L removed)  Reviewed I/O's: -200 ml x 24 hours and +4.2 L since admission  Stool output: 200 ml x 24 hours  Spoke with pt, who reports feeling a little better today, however, sleepy after procedure. He reports appetite has improved since admission and estimates he has been eating 2/3 of his meals. Documented meal completions 25-100%.   Pt does not like the [employer], but likes fruit juices. He is amenable to [employer].   Per TOC notes, hospice eligibility pending.  Labs reviewed: Na: 134, CBGS: 73-125 (inpatient orders for glycemic control are 0-15 units insulin aspart TID with meals).    Diet Order:   Diet Order             DIET DYS 3 Room service appropriate? Yes with Assist; Fluid consistency: Thin  Diet effective now                   EDUCATION NEEDS:   Education needs have been addressed  Skin:  Skin  Assessment: Reviewed RN Assessment  Last BM:  12/15/21  Height:   Ht Readings from Last 1 Encounters:  12/14/21 _0  (1.702 m)    Weight:   Wt Readings from Last 1 Encounters:  12/15/21 88 kg   BMI:  Body mass index is 30.38 kg/m.  Estimated Nutritional Needs:   Kcal:  1900-2100  Protein:  105-120 grams  Fluid:  >1.9 L    Loistine Chance, RD, LDN, [place] Registered Dietitian II Certified Diabetes Care and Education Specialist Please refer to [facility] for RD and/or RD on-call/weekend/after hours pager

## 2021-12-15 NOTE — Progress Notes (Signed)
PULMONOLOGY         Date: 12/15/2021,   MRN# [id number] [name]. 1946/03/08     AdmissionWeight: 83.5 kg                 CurrentWeight: 39 kg   Referring physician: Dr Si Raider   CHIEF COMPLAINT:   Pulmonary hypertension with pleural effusions    HISTORY OF PRESENT ILLNESS   This is a 76 yo male with history of coronary artery disease status post CABG, history of multiple myeloma, diabetes mellitus with stage III chronic kidney disease, hypertension, anxiety and depression who presents to the ER for evaluation of worsening shortness of breath from his baseline and weakness. He had moderate oxygen desaturation in the field, bmp an dcbc reviewed by me with renal failure and nephrotic syndrome with lactic acidosis and electorlyte derrangements.  He is being treated for cellilitis of LE and acute on chronic CHF exacerbation. TTE was done with findings of significant pulmonary hypertension.    12/14/21- patient is somewhat unchanged, he did not have much fluid in pleural space so we cancelled throacentesis. The VQ san was negative for PE. There is atelectasis and emphysema bilaterally. He is in discussion with Palliative regarding GOC and I recommend hospice.   12/15/21- Patient is mildly improved post paracentesis, overall he is with poor prognosis and I agree with hospice and PCCM will sign off at this time and available if still needed.   PAST MEDICAL HISTORY   Past Medical History:  Diagnosis Date   Angina pectoris ([place])    Anxiety    Arthritis    CHF (congestive heart failure) ([place])    Coronary artery disease    Depression    Diabetes mellitus without complication ([place])    Patient takes Metformin.   Diverticulosis    GERD (gastroesophageal reflux disease)    Hyperlipidemia    Hypertension    Multiple myeloma ([place])    Multiple myeloma ([place]) 03/27/2015   Myocardial infarction [facility]) April 2001   widowmaker   Shortness of breath dyspnea    Sleep apnea    No  CPAP @ present   Spinal stenosis    Squamous cell carcinoma of skin 02/19/2014   Left dorsal hand. WD SCC with superficial infiltration. Re-shaved 04/23/2014. Re-shaved 09/05/2014.   Squamous cell carcinoma of skin 09/08/2020   Left ant scalp. WD SCC.   Squamous cell carcinoma of skin 02/04/2021   R hand dorsum, EDC      SURGICAL HISTORY   Past Surgical History:  Procedure Laterality Date   CARDIAC CATHETERIZATION     CARPAL TUNNEL RELEASE     CATARACT EXTRACTION     COLONOSCOPY WITH PROPOFOL N/A 11/11/2015   Procedure: COLONOSCOPY WITH PROPOFOL;  Surgeon: Lollie Sails, MD;  Location: [facility] ENDOSCOPY;  Service: Endoscopy;  Laterality: N/A;   CORONARY ARTERY BYPASS GRAFT     EYE SURGERY Bilateral    Cataract Extraction   INGUINAL HERNIA REPAIR     JOINT REPLACEMENT Right 2008   Right Total Hip Replacement   PILONIDAL CYST EXCISION     ROTATOR CUFF REPAIR     TOTAL HIP ARTHROPLASTY Right    VENTRAL HERNIA REPAIR N/A 08/15/2015   Procedure: VENTRAL HERNIA REPAIR WITH MESH ;  Surgeon: Leonie Green, MD;  Location: [facility] ORS;  Service: General;  Laterality: N/A;     FAMILY HISTORY   Family History  Problem Relation Age of Onset   Heart disease  Father    Stroke Mother    Prostate cancer Maternal Grandfather 49     SOCIAL HISTORY   Social History   Tobacco Use   Smoking status: Former    Packs/day: 1.50    Years: 40.00    Pack years: 60.00    Types: Cigarettes    Quit date: 02/24/2000    Years since quitting: 21.8   Smokeless tobacco: Former    Quit date: 03/04/2000  Vaping Use   Vaping Use: Never used  Substance Use Topics   Alcohol use: No   Drug use: No     MEDICATIONS    Home Medication:    Current Medication:  Current Facility-Administered Medications:    0.9 %  sodium chloride infusion, , Intravenous, PRN, Sharion Settler, NP, Stopped at 12/12/21 1350   acetaminophen (TYLENOL) tablet 650 mg, 650 mg, Oral, Q6H PRN, 650 mg at 12/15/21 0306  **OR** acetaminophen (TYLENOL) suppository 650 mg, 650 mg, Rectal, Q6H PRN, Agbata, Tochukwu, MD   ALPRAZolam Duanne Moron) tablet 0.5 mg, 0.5 mg, Oral, BID PRN, Si Raider, Ailene Rud, MD, 0.5 mg at 12/13/21 0445   aspirin chewable tablet 81 mg, 81 mg, Oral, Daily, Agbata, Tochukwu, MD, 81 mg at 12/15/21 0840   ceFAZolin (ANCEF) IVPB 2g/100 mL premix, 2 g, Intravenous, Q8H, Darrick Penna, RPH, Last Rate: 200 mL/hr at 12/15/21 0623, 2 g at 12/15/21 0623   dextrose 50 % solution 50 mL, 1 ampule, Intravenous, Once PRN, Wouk, Ailene Rud, MD   heparin injection 5,000 Units, 5,000 Units, Subcutaneous, Q8H, Wouk, Ailene Rud, MD, 5,000 Units at 12/15/21 2952   insulin aspart (novoLOG) injection 0-15 Units, 0-15 Units, Subcutaneous, TID WC, Agbata, Tochukwu, MD, 2 Units at 12/15/21 0843   ipratropium-albuterol (DUONEB) 0.5-2.5 (3) MG/3ML nebulizer solution 3 mL, 3 mL, Nebulization, Q6H PRN, Agbata, Tochukwu, MD, 3 mL at 12/10/21 1009   lidocaine (LIDODERM) 5 % 1 patch, 1 patch, Transdermal, Q24H, Sharion Settler, NP, 1 patch at 12/13/21 1956   loratadine (CLARITIN) tablet 10 mg, 10 mg, Oral, Daily, Wouk, Ailene Rud, MD, 10 mg at 12/15/21 0840   metroNIDAZOLE (FLAGYL) IVPB 500 mg, 500 mg, Intravenous, Q12H, Wouk, Ailene Rud, MD, Last Rate: 100 mL/hr at 12/15/21 0048, 500 mg at 12/15/21 0048   midodrine (PROAMATINE) tablet 2.5 mg, 2.5 mg, Oral, TID WC, Ottie Glazier, MD, 2.5 mg at 12/15/21 0840   multivitamin with minerals tablet 1 tablet, 1 tablet, Oral, Daily, Wouk, Ailene Rud, MD, 1 tablet at 12/15/21 0840   mupirocin ointment (BACTROBAN) 2 % 1 application, 1 application, Nasal, BID, Wouk, Ailene Rud, MD, 1 application at 84/13/24 0843   ondansetron (ZOFRAN) tablet 4 mg, 4 mg, Oral, Q6H PRN, 4 mg at 12/11/21 2137 **OR** ondansetron (ZOFRAN) injection 4 mg, 4 mg, Intravenous, Q6H PRN, Agbata, Tochukwu, MD   PARoxetine (PAXIL) tablet 10 mg, 10 mg, Oral, QHS, Agbata, Tochukwu, MD, 10 mg at 12/14/21  2200   pravastatin (PRAVACHOL) tablet 40 mg, 40 mg, Oral, q1800, Agbata, Tochukwu, MD, 40 mg at 12/14/21 1636   propranolol (INDERAL) tablet 10 mg, 10 mg, Oral, BID, Sharion Settler, NP, 10 mg at 12/14/21 0827    ALLERGIES   Pravastatin sodium, Statins, and Zocor [simvastatin]     REVIEW OF SYSTEMS    Review of Systems:  Gen:  Denies  fever, sweats, chills weigh loss  HEENT: Denies blurred vision, double vision, ear pain, eye pain, hearing loss, nose bleeds, sore throat Cardiac:  No dizziness, chest pain or heaviness, chest  tightness,edema Resp:   Denies cough or sputum porduction, shortness of breath,wheezing, hemoptysis,  Gi: Denies swallowing difficulty, stomach pain, nausea or vomiting, diarrhea, constipation, bowel incontinence Gu:  Denies bladder incontinence, burning urine Ext:   Denies Joint pain, stiffness or swelling Skin: Denies  skin rash, easy bruising or bleeding or hives Endoc:  Denies polyuria, polydipsia , polyphagia or weight change Psych:   Denies depression, insomnia or hallucinations   Other:  All other systems negative   VS: BP (!) 102/53    Pulse (!) 56    Temp 98.2 F (36.8 C) (Oral)    Resp 18    Ht _0  (1.702 m)    Wt 88 kg    SpO2 96%    BMI 30.38 kg/m      PHYSICAL EXAM    GENERAL:NAD, no fevers, chills, no weakness no fatigue HEAD: Normocephalic, atraumatic.  EYES: Pupils equal, round, reactive to light. Extraocular muscles intact. No scleral icterus.  MOUTH: Moist mucosal membrane. Dentition intact. No abscess noted.  EAR, NOSE, THROAT: Clear without exudates. No external lesions.  NECK: Supple. No thyromegaly. No nodules. No JVD.  PULMONARY: mild rhonchi at bases bilaterally CARDIOVASCULAR: S1 and S2. Regular rate and rhythm. No murmurs, rubs, or gallops. No edema. Pedal pulses 2+ bilaterally.  GASTROINTESTINAL: Soft, nontender, nondistended. No masses. Positive bowel sounds. No hepatosplenomegaly.  MUSCULOSKELETAL: No swelling,  clubbing, or edema. Range of motion full in all extremities.  NEUROLOGIC: Cranial nerves II through XII are intact. No gross focal neurological deficits. Sensation intact. Reflexes intact.  SKIN: No ulceration, lesions, rashes, or cyanosis. Skin warm and dry. Turgor intact.  PSYCHIATRIC: Mood, affect within normal limits. The patient is awake, alert and oriented x 3. Insight, judgment intact.       IMAGING    DG Chest 2 View  Result Date: 12/01/2021 CLINICAL DATA:  Fall, tachycardia EXAM: CHEST - 2 VIEW COMPARISON:  09/07/2021 FINDINGS: Interstitial coarsening secondary to parenchymal scarring better appreciated on CT examination of 11/07/2020 within the left mid and lower lung zone is unchanged. Resultant mild left-sided volume loss again noted. No superimposed confluent pulmonary infiltrate. No pneumothorax or pleural effusion. Coronary artery bypass grafting has been performed. Cardiac size is within normal limits. Pulmonary vascularity is normal. Osseous structures are age-appropriate. No acute bone abnormality. IMPRESSION: No active cardiopulmonary disease. Electronically Signed   By: Fidela Salisbury M.D.   On: 12/01/2021 20:21   CT Head Wo Contrast  Result Date: 12/01/2021 CLINICAL DATA:  Trip and fall striking head. EXAM: CT HEAD WITHOUT CONTRAST TECHNIQUE: Contiguous axial images were obtained from the base of the skull through the vertex without intravenous contrast. RADIATION DOSE REDUCTION: This exam was performed according to the departmental dose-optimization program which includes automated exposure control, adjustment of the mA and/or kV according to patient size and/or use of iterative reconstruction technique. COMPARISON:  Provided head CT 12/21/2008 FINDINGS: Brain: Generalized cerebral atrophy. No intracranial hemorrhage, mass effect, or midline shift. No hydrocephalus. The basilar cisterns are patent. Mild periventricular and deep chronic small vessel ischemic change. No evidence  of territorial infarct or acute ischemia. No extra-axial or intracranial fluid collection. Vascular: Atherosclerosis of skullbase vasculature without hyperdense vessel or abnormal calcification. Skull: No fracture or focal lesion. Sinuses/Orbits: No acute findings. Other: Right frontal scalp hematoma. IMPRESSION: 1. Right frontal scalp hematoma. No acute intracranial abnormality. No skull fracture. 2. Generalized atrophy and chronic small vessel ischemic change. Electronically Signed   By: Keith Rake M.D.   On:  12/01/2021 18:39   CT CHEST WO CONTRAST  Result Date: 12/12/2021 CLINICAL DATA:  Aspiration hypoxia. Fluid overload. Infection. Shortness of breath. EXAM: CT CHEST WITHOUT CONTRAST TECHNIQUE: Multidetector CT imaging of the chest was performed following the standard protocol without IV contrast. RADIATION DOSE REDUCTION: This exam was performed according to the departmental dose-optimization program which includes automated exposure control, adjustment of the mA and/or kV according to patient size and/or use of iterative reconstruction technique. COMPARISON:  Chest radiograph 12/11/2021. Esophagram 12/11/2021. CT 12/09/2021 FINDINGS: Cardiovascular: Cardiac enlargement. No pericardial effusions. Postoperative changes in the mediastinum consistent with coronary bypass. Calcification of the aorta. No aneurysm. Mediastinum/Nodes: Thyroid gland is unremarkable. Esophagus is decompressed. Scattered lymph nodes throughout the mediastinum without pathologic enlargement, likely reactive. Lungs/Pleura: Small bilateral pleural effusions. Pleural calcification on the left may represent post inflammatory process. Diffuse emphysematous changes in the lungs. Atelectasis or consolidation in both lung bases with patchy areas of airspace infiltration seen in both lungs, most prominently in the right upper lung. Pulmonary infiltration is progressing since previous study, possibly progressing pneumonia or interval  aspiration. Upper Abdomen: Upper abdominal ascites. Liver morphology consistent with hepatic cirrhosis. Mild gynecomastia. Musculoskeletal: Sternotomy wires. Degenerative changes in the spine and shoulders. Prominent thoracic kyphosis and mild scoliosis convex towards the right. Multiple lucent lesions, some with expansile change, are demonstrated in several ribs and in the spine. This is consistent with history of multiple myeloma. Several vertebral compression deformities are present. There is an acute or subacute appearing fracture of the body of a midthoracic vertebra, likely T7. This likely represents a pathologic fracture. No change since the previous study from several days ago. IMPRESSION: 1. Bilateral pleural effusions with progressing infiltrates, particularly on the right. This could indicate progression of pneumonia or interval aspiration. 2. Stable appearance of multiple bone lesions and vertebral fractures of differing ages, including an acute appearing fracture probably at T7. No change since previous study. 3. Changes of hepatic cirrhosis with upper abdominal ascites. 4. Aortic atherosclerosis. 5. Emphysematous changes. Electronically Signed   By: Lucienne Capers M.D.   On: 12/12/2021 17:58   CT CHEST WO CONTRAST  Result Date: 12/09/2021 CLINICAL DATA:  Shortness of breath. Generalized weakness. Recent fall. Sepsis protocol initiated by EMS. EXAM: CT CHEST WITHOUT CONTRAST TECHNIQUE: Multidetector CT imaging of the chest was performed following the standard protocol without IV contrast. RADIATION DOSE REDUCTION: This exam was performed according to the departmental dose-optimization program which includes automated exposure control, adjustment of the mA and/or kV according to patient size and/or use of iterative reconstruction technique. COMPARISON:  Portable chest obtained earlier today. Chest CTA dated 11/07/2020. FINDINGS: Cardiovascular: Atheromatous calcifications, including the coronary  arteries and aorta. Mildly enlarged heart, unchanged. Mediastinum/Nodes: No enlarged mediastinal or axillary lymph nodes. Thyroid gland, trachea, and esophagus demonstrate no significant findings. Lungs/Pleura: Bilateral dependent atelectasis. Stable parenchymal scarring in the posterolateral lingula. Minimal right upper lobe dependent atelectasis with improvement. No significant change in bilateral bullous changes in a centrilobular distribution. Interval minimal right pleural effusion. Small left pleural effusion without significant change. Stable pleural calcifications on the left. Upper Abdomen: Interval small amount of ascites. Contracted gallbladder with no significant change in multiple small calculi measuring up to 6 mm in maximum diameter each. No gallbladder wall thickening or pericholecystic fluid. Ingested tablet fragments in the stomach and duodenum. Musculoskeletal: Previously noted multiple lytic lesions in the bony skeleton compatible with previously reported multiple myeloma. Stable T10 fracture deformity without acute fracture lines. An oblique fracture is again  demonstrated in the T7 vertebral body extending through the pedicle on the right without additional posterior extension. IMPRESSION: 1. No interval findings suspicious for pneumonia. 2. Bilateral dependent atelectasis and lingular scarring. 3. Interval minimal right pleural effusion and stable small left pleural effusion. 4. Stable bony changes of multiple myeloma with an acute pathological fracture of the T7 vertebral body and old compression fracture of the T10 vertebral body. 5. Stable changes of COPD with centrilobular emphysema. 6. Stable dense calcific coronary artery and aortic atherosclerosis. 7. Cholelithiasis without evidence of cholecystitis. 8. Stable probable posttraumatic or postinflammatory left pleural calcifications. Aortic Atherosclerosis (ICD10-I70.0) and Emphysema (ICD10-J43.9). Electronically Signed   By: Claudie Revering  M.D.   On: 12/09/2021 16:17   CT Cervical Spine Wo Contrast  Result Date: 12/01/2021 CLINICAL DATA:  Trip and fall.  Neck trauma. EXAM: CT CERVICAL SPINE WITHOUT CONTRAST TECHNIQUE: Multidetector CT imaging of the cervical spine was performed without intravenous contrast. Multiplanar CT image reconstructions were also generated. RADIATION DOSE REDUCTION: This exam was performed according to the departmental dose-optimization program which includes automated exposure control, adjustment of the mA and/or kV according to patient size and/or use of iterative reconstruction technique. COMPARISON:  CT cervical spine 11/07/2020 FINDINGS: Alignment: Straightening of normal lordosis. No traumatic subluxation. Skull base and vertebrae: No acute fracture. Intact dens and skull base. C1-C2 degenerative change. Bridging anterior as well as posterior osteophytes with bony ankylosis. No osteophyte fracture. The bones are under mineralized with heterogeneous marrow, no bony destructive process. Soft tissues and spinal canal: No prevertebral fluid or swelling. No visible canal hematoma. Disc levels: Diffuse disc space narrowing with ossification of scattered disc spaces, unchanged. Flowing osteophytes anterior and posteriorly with bony ankylosis. Multilevel neural foraminal narrowing without high-grade canal stenosis. Upper chest: Chronic left pleuroparenchymal thickening. No acute findings. Other: Advanced carotid calcifications. IMPRESSION: 1. No acute fracture or traumatic subluxation of the cervical spine. 2. Unchanged appearance of diffuse degenerative disc disease and flowing anterior and posterior osteophytes, typical of ankylosis. Electronically Signed   By: Keith Rake M.D.   On: 12/01/2021 18:45   MR LUMBAR SPINE WO CONTRAST  Result Date: 12/12/2021 CLINICAL DATA:  History of epidural steroid injections in lumbar spine, bacteremia, concern for osteomyelitis. EXAM: MRI LUMBAR SPINE WITHOUT CONTRAST TECHNIQUE:  Multiplanar, multisequence MR imaging of the lumbar spine was performed. No intravenous contrast was administered. COMPARISON:  04/06/2019 FINDINGS: Segmentation:  Standard. Alignment:  Levocurvature of the lumbar spine. Vertebrae: No acute fracture or suspicious osseous lesion. No evidence of discitis or osteomyelitis. Unchanged degenerative changes at the anterior superior endplate of L5. Vertebral body endplates are otherwise intact.Redemonstrated cystic lesion in the left iliac bone, which appears unchanged compared to 2020. Conus medullaris and cauda equina: Conus extends to the T12 level. Conus and cauda equina appear normal. No epidural collection. Paraspinal and other soft tissues: Negative. Disc levels: L1-L2: No significant disc bulge. No spinal canal stenosis or neural foraminal narrowing. L2-L3: Mild disc bulge. Mild facet arthropathy. Narrowing of the lateral recesses. No spinal canal stenosis or neural foraminal narrowing. L3-L4: Mild disc bulge. Moderate right and mild left facet arthropathy. Narrowing of the lateral recesses. No spinal canal stenosis or neural foraminal narrowing. L4-L5: Mild disc bulge. Severe facet arthropathy. Effacement of the lateral recesses. Moderate spinal canal stenosis, which appears similar to the prior exam. Unchanged moderate bilateral neural foraminal narrowing. L5-S1: No significant disc bulge. Moderate right and mild left facet arthropathy. No spinal canal stenosis or neural foraminal narrowing. IMPRESSION: 1. No  acute osseous abnormality. No evidence of discitis osteomyelitis. 2. L4-L5 moderate spinal canal stenosis and moderate bilateral neural foraminal narrowing, unchanged. Effacement of the lateral recesses at this level likely compresses the descending L5 nerve roots. 3. Narrowing of the lateral recesses at L2-L3 and L3-L4 could affect the descending L3 and L4 nerve roots, respectively. Electronically Signed   By: Merilyn Baba M.D.   On: 12/12/2021 03:16   NM  Pulmonary Perfusion  Result Date: 12/14/2021 CLINICAL DATA:  History of DVT, multiple falls, right pleural effusion EXAM: NUCLEAR MEDICINE PERFUSION LUNG SCAN TECHNIQUE: Perfusion images were obtained in multiple projections after intravenous injection of radiopharmaceutical. Ventilation scans intentionally deferred if perfusion scan and chest x-ray adequate for interpretation during COVID 19 epidemic. RADIOPHARMACEUTICALS:  4.09 mCi Tc-2mMAA IV COMPARISON:  12/14/2021 FINDINGS: Planar images of the lungs demonstrates heterogeneous radiotracer uptake throughout the pulmonary parenchyma. There are no wedge-shaped perfusion defects to suggest pulmonary embolus. Heterogeneous decreased uptake in the upper lobes consistent with known emphysema. Heterogeneous areas of decreased uptake within the lower lobes consistent with atelectasis on recent CT and x-ray. IMPRESSION: 1. By PISAPED criteria, no evidence of pulmonary embolus. No wedge-shaped perfusion defects. Heterogeneous uptake throughout the lungs consistent with emphysema and atelectasis as seen on recent CT. Electronically Signed   By: MRanda NgoM.D.   On: 12/14/2021 15:00   [place]ST (PLEURAL EFFUSION)  Result Date: 12/14/2021 CLINICAL DATA:  Pleural effusion EXAM: CHEST ULTRASOUND COMPARISON:  None. FINDINGS: Limited ultrasound exam of the right chest was performed. Images demonstrate a small pleural effusion, not amenable for safe image guided thoracentesis. No thoracentesis performed. IMPRESSION: Small right pleural effusion.  No thoracentesis performed. Electronically Signed   By: YAlbin FellingM.D.   On: 12/14/2021 15:45   [place]NAL  Result Date: 12/11/2021 CLINICAL DATA:  Acute kidney injury EXAM: RENAL / URINARY TRACT ULTRASOUND COMPLETE COMPARISON:  None. FINDINGS: Right Kidney: Renal measurements: 10.4 x 4.6 x 5.4 cm = volume: 135.2 mL. Echogenicity within normal limits. No mass or hydronephrosis visualized. Left Kidney: Renal measurements:  9.4 x 5 x 4 cm = volume: 99.1 mL. Echogenicity within normal limits. No mass or hydronephrosis visualized. Bladder: Appears normal for degree of bladder distention. Suboptimal evaluation due to bandage per technologist. Other: Ascites. IMPRESSION: No hydronephrosis. Ascites. Electronically Signed   By: PMacy MisM.D.   On: 12/11/2021 16:54   DG Chest Port 1 View  Result Date: 12/14/2021 CLINICAL DATA:  Right pleural effusion. EXAM: PORTABLE CHEST 1 VIEW COMPARISON:  CT chest dated December 12, 2021 FINDINGS: The heart is enlarged with evidence of prior coronary artery bypass grafting. Emphysematous changes of the lungs. Left pleural thickening and scarring is again noted. There also multiple small bilateral opacities, likely scarring/atelectasis. Small right pleural effusion with atelectasis is unchanged. Osteopenia and degenerative changes of the thoracic spine. IMPRESSION: 1.  Stable cardiomegaly. 2. Emphysematous changes with multifocal bilateral atelectasis/scarring. Stable pleural thickening in the left upper lobe as well as in the mid left lung. Stable appearance of the small right pleural effusion with right basilar atelectasis. Electronically Signed   By: IKeane PoliceD.O.   On: 12/14/2021 10:35   DG Chest Port 1 View  Result Date: 12/11/2021 CLINICAL DATA:  Aspiration pneumonia EXAM: PORTABLE CHEST 1 VIEW COMPARISON:  CT chest 12/09/2021 FINDINGS: Bilateral patchy interstitial and alveolar airspace opacities. No pleural effusion or pneumothorax. Stable cardiomegaly. Prior CABG. No acute osseous abnormality. IMPRESSION: 1. Bilateral patchy interstitial and alveolar airspace opacities concerning  for multilobar pneumonia versus pulmonary edema. Electronically Signed   By: Kathreen Devoid M.D.   On: 12/11/2021 18:55   DG Chest Port 1 View  Result Date: 12/11/2021 CLINICAL DATA:  Shortness of breath EXAM: PORTABLE CHEST 1 VIEW COMPARISON:  Previous studies including the examination of 12/09/2021  FINDINGS: Transverse diameter of heart is increased. Central pulmonary vessels are prominent. Breathing motion limits evaluation of lung fields. There are no signs of alveolar pulmonary edema. There is asymmetric pleural density in the lateral aspect of left apex with no significant change. There is some crowding of markings in the lower lung fields. No new focal pulmonary consolidation is seen. There is blunting of lateral CP angles, more so on the left side. There is no pneumothorax. Multiple old fractures are seen in the left ribs. There is evidence of previous coronary bypass surgery. IMPRESSION: Cardiomegaly. There are no signs of alveolar pulmonary edema or new focal pulmonary consolidation. Small bilateral pleural effusions, more so on the left side. Electronically Signed   By: Elmer Picker M.D.   On: 12/11/2021 15:27   DG Chest Port 1 View  Result Date: 12/09/2021 CLINICAL DATA:  Shortness of breath. History of congestive heart failure, hypertension and diabetes. Questionable sepsis-evaluate for abnormality. EXAM: PORTABLE CHEST 1 VIEW COMPARISON:  Radiographs 12/01/2021 and 09/07/2021.  CT 11/07/2020. FINDINGS: 1058 hours. The heart size and mediastinal contours are stable status post median sternotomy and CABG. There is aortic atherosclerosis. There is chronic lung disease with asymmetric pleural thickening and parenchymal scarring on the left. Compared with the most recent radiographs, there are increased interstitial and ground-glass opacities throughout both lungs which could reflect superimposed mild edema. No confluent airspace opacity, pneumothorax or significant pleural effusion identified. Old rib fractures are present on the left. There is advanced arthropathy at both shoulders. IMPRESSION: Increased interstitial and ground-glass opacities throughout both lungs compared with most recent radiographs, suggesting possible mild edema. Underlying chronic pleuroparenchymal scarring as  described. Electronically Signed   By: Richardean Sale M.D.   On: 12/09/2021 11:11   ECHOCARDIOGRAM COMPLETE  Result Date: 12/12/2021    ECHOCARDIOGRAM REPORT   Patient Name:   Luis [name]. Date of Exam: 12/12/2021 Medical Rec #:  [id number]        Height:       67.0 in Accession #:    [id number]       Weight:       181.4 lb Date of Birth:  11-22-1945       BSA:          1.940 m Patient Age:    17 years         BP:           98/63 mmHg Patient Gender: M                HR:           62 bpm. Exam Location:  [facility] Procedure: 2D Echo Indications:     Bacteremia R78.81  History:         Patient has prior history of Echocardiogram examinations, most                  recent 11/10/2020.  Sonographer:     Kathlen Brunswick RDCS Referring Phys:  [place] Diagnosing Phys: [place]  1. Left ventricular ejection fraction, by estimation, is 60 to 65%. The left ventricle has normal function. The left ventricle has no regional wall motion  abnormalities. Left ventricular diastolic parameters are indeterminate. There is the interventricular septum is flattened in systole, consistent with right ventricular pressure overload.  2. Right ventricular systolic function is severely reduced. The right ventricular size is severely enlarged. There is severely elevated pulmonary artery systolic pressure. The estimated right ventricular systolic pressure is 00.7 mmHg.  3. Right atrial size was severely dilated.  4. The mitral valve is degenerative. Mild to moderate mitral valve regurgitation. No evidence of mitral stenosis.  5. Tricuspid valve regurgitation is severe.  6. The aortic valve is normal in structure. Aortic valve regurgitation is not visualized. No aortic stenosis is present.  7. The inferior vena cava is normal in size with greater than 50% respiratory variability, suggesting right atrial pressure of 3 mmHg.  8. Cannot exclude a small PFO. FINDINGS  Left Ventricle: Left ventricular ejection fraction,  by estimation, is 60 to 65%. The left ventricle has normal function. The left ventricle has no regional wall motion abnormalities. The left ventricular internal cavity size was small. There is no left ventricular hypertrophy. The interventricular septum is flattened in systole, consistent with right ventricular pressure overload. Left ventricular diastolic parameters are indeterminate. Right Ventricle: The right ventricular size is severely enlarged. Right vetricular wall thickness was not well visualized. Right ventricular systolic function is severely reduced. There is severely elevated pulmonary artery systolic pressure. The tricuspid regurgitant velocity is 4.50 m/s, and with an assumed right atrial pressure of 10 mmHg, the estimated right ventricular systolic pressure is 62.2 mmHg. Left Atrium: Left atrial size was normal in size. Right Atrium: Right atrial size was severely dilated. Pericardium: There is no evidence of pericardial effusion. Mitral Valve: The mitral valve is degenerative in appearance. Mild to moderate mitral valve regurgitation. No evidence of mitral valve stenosis. Tricuspid Valve: The tricuspid valve is normal in structure. Tricuspid valve regurgitation is severe. No evidence of tricuspid stenosis. Aortic Valve: The aortic valve is normal in structure. Aortic valve regurgitation is not visualized. No aortic stenosis is present. Aortic valve peak gradient measures 5.5 mmHg. Pulmonic Valve: The pulmonic valve was grossly normal. Pulmonic valve regurgitation is not visualized. No evidence of pulmonic stenosis. Aorta: The aortic root is normal in size and structure. Venous: The inferior vena cava is normal in size with greater than 50% respiratory variability, suggesting right atrial pressure of 3 mmHg. IAS/Shunts: Cannot exclude a small PFO.  LEFT VENTRICLE PLAX 2D LVIDd:         4.90 cm     Diastology LVIDs:         3.50 cm     LV e' medial:    4.24 cm/s LV PW:         1.20 cm     LV E/e'  medial:  9.8 LV IVS:        1.20 cm     LV e' lateral:   5.00 cm/s LVOT diam:     2.10 cm     LV E/e' lateral: 8.3 LV SV:         38 LV SV Index:   20 LVOT Area:     3.46 cm  LV Volumes (MOD) LV vol d, MOD A4C: 37.4 ml LV vol s, MOD A4C: 16.5 ml LV SV MOD A4C:     37.4 ml RIGHT VENTRICLE RV Basal diam:  5.80 cm RV S prime:     9.46 cm/s TAPSE (M-mode): 1.9 cm LEFT ATRIUM  Index        RIGHT ATRIUM           Index LA diam:        3.50 cm 1.80 cm/m   RA Area:     18.40 cm LA Vol (A2C):   30.8 ml 15.87 ml/m  RA Volume:   53.10 ml  27.37 ml/m LA Vol (A4C):   18.9 ml 9.74 ml/m LA Biplane Vol: 25.3 ml 13.04 ml/m  AORTIC VALVE                 PULMONIC VALVE AV Area (Vmax): 1.57 cm     PV Vmax:          0.90 m/s AV Vmax:        117.00 cm/s  PV Peak grad:     3.3 mmHg AV Peak Grad:   5.5 mmHg     PR End Diast Vel: 16.00 msec LVOT Vmax:      52.90 cm/s LVOT Vmean:     30.600 cm/s LVOT VTI:       0.110 m  AORTA Ao Root diam: 3.60 cm Ao Asc diam:  3.10 cm MITRAL VALVE               TRICUSPID VALVE MV Area (PHT): 3.34 cm    TV Peak grad:   64.8 mmHg MV Decel Time: 227 msec    TV Vmax:        4.03 m/s MV E velocity: 41.60 cm/s  TR Peak grad:   81.0 mmHg MV A velocity: 63.00 cm/s  TR Vmax:        450.00 cm/s MV E/A ratio:  0.66                            SHUNTS                            Systemic VTI:  0.11 m                            Systemic Diam: 2.10 cm Donnelly Angelica Electronically signed by Donnelly Angelica Signature Date/Time: 12/12/2021/3:04:08 PM    Final    US LIVER DOPPLER  Result Date: 12/14/2021 CLINICAL DATA:  Cirrhosis.  Evaluate hepatic vasculature. EXAM: DUPLEX ULTRASOUND OF LIVER TECHNIQUE: Color and duplex Doppler ultrasound was performed to evaluate the hepatic in-flow and out-flow vessels. COMPARISON:  Chest CT - 12/12/2021 FINDINGS: There is diffuse increased slightly coarsened echogenicity of the hepatic parenchyma with nodularity hepatic contour compatible with clinical suspicion of cirrhosis.  No discrete hepatic lesions. No intrahepatic biliary ductal dilatation. Portal Vein Velocities (normal hepatopetal directional flow) Main:  17 cm/sec Right:  7 cm/sec Left:  11 cm/sec Hepatic Vein Velocities (normal hepatofugal directional flow) Right:  29 cm/sec Middle:  20 cm/sec Left:  9 cm/sec Hepatic Artery Velocity:  162 cm/sec Splenic Vein Velocity:  11 cm/sec Spleen: Normal in size measuring 12.0 x 12.0 x 3.0 cm with calculated volume of 245 cc. Varices: None visualized Ascites: Small to moderate amount of intra-abdominal ascites. IMPRESSION: 1. Findings suggestive hepatic cirrhosis. No discrete hepatic lesions though further evaluation could be performed with nonemergent either cirrhotic protocol CT or (preferably) abdominal MRI as indicated. 2. Patent hepatic vasculature with normal directional flow. 3. Small to moderate amount of intra-abdominal ascites without evidence of splenomegaly. Electronically Signed  By: Sandi Mariscal M.D.   On: 12/14/2021 10:26   DG ESOPHAGUS W DOUBLE CM (HD)  Result Date: 12/11/2021 CLINICAL DATA:  A 76 year old male presents for evaluation of swallowing and dysphagia. EXAM: ESOPHOGRAM/BARIUM SWALLOW TECHNIQUE: Single contrast examination was performed using  thin barium. FLUOROSCOPY: Fluoroscopy time: 2 minutes 48 seconds Fluoroscopy dose: 50.7 mGy COMPARISON:  Chest CT from December 09, 2021. FINDINGS: The exam was markedly limited due to the patient's debilitated state and difficulty in positioning the patient. Exam was performed in slightly LPO just off AP and RPO positioning. The esophagus was found to be markedly patulous above the GE junction where there was tortuosity and extensive tertiary contractions throughout the exam. Long segment narrowing of the GE junction was noted on multiple images though this could not be challenged with a large bolus and the presence of dysmotility also limited assessment of this area. This appears to be smooth and long segment. Along the  distal medial esophagus there are small pulsion diverticula with distortion of the medial esophageal wall. Contrast did pass into the stomach aided by the administration of some water. Additional images could not be obtained due to coughing that the patient experienced though there was no visible "gross" aspiration.There may be some laryngeal penetration though this area was difficult to evaluate. In the RPO position marked "corkscrewing" and tortuosity of the proximal esophagus was noted with the patient in kyphotic position at baseline. IMPRESSION: 1. Markedly patulous esophagus above the GE junction with extensive tertiary contractions throughout the exam. "Corkscrewing" of the esophagus as well was noted with limited peristaltic activity distally though there was emptying of the esophagus despite segmental narrowing of the distal esophagus into the stomach. Findings could relate to distal esophageal stricture but could not be well assessed due to the debilitated nature of the patient. Marked dysmotility and the long segment narrowing with beaked appearance raising the question achalasia. Esophageal manometry and endoscopy may be helpful to narrow the differential. 2. Given difficulty with swallowing a dedicated swallow function may also be helpful. Electronically Signed   By: Zetta Bills M.D.   On: 12/11/2021 16:04      ASSESSMENT/PLAN   Acute on chronic hypoxemic respiratory failure    -patient initially with leukocytosis and sepsis presumably due to cellulitis     -patient has multifactorial causes including severe emphysema, bilateral effusions, chronic lung disease with rounded atelectasis and pleural calcification, pulmonary infiltrates suggestive of ongoing pneumonia, CHF exacerbation , COPD exacerbation, pulmonary hypertension and extreme kyphophosis with restrictive physiology not to mention physical deconditioning and unmentioned comorbid conditions.     - He is currently with renal failure  which appears to be acute on chronic and is likely to hinder diuresis necessary for effusions.    -he has staph aureus bacteremia -will hold propranolol to potentiate BP and allow better diuresis for now  Acute on chronic renal failure KDIGO 3  Dc nonessential nephrotonix    - patient with UTI but only 1K colonies      - dc non essential nephrotoxins - will hold vitamin C, protonix, vitamin D, calcium until acutely ill status is improved.      Bilateral pleural effusions  Diagnositic thoracentesis and therapeutic thoracentesis -aldactone 50 mg and lasix IV   Sepsis with staph bacteremia  Present on admission    - on ancef and flagyl    - ID team on case appreciate input.    Moderate to severe Pulmonary hypertension     -patient may  have had PE although his symptoms may be explained by all of the above medical problems   - we will order VQ scan for now to rule out any large defects   - I suspect PH is group 2 due to heart failure with valvular heart disease which mainly focuses on optimization of CHF status  Acute on chronic diastolic CHF  BNP >4628 -TTE reviewed -agree with diuresis for now -monitor telemetry     Extreme kyphosis and physical deconditioning    Patient is at high risk of chronic lung disease due to kyphosis and sedentary lifestyle , he is however DNR and is with advanced age.  There is consultation placed already for palliative care which seems reasonable.      Thank you for allowing me to participate in the care of this patient.    Patient/Family are satisfied with care plan and all questions have been answered.  This document was prepared using Dragon voice recognition software and may include unintentional dictation errors.     Ottie Glazier, M.D.  Division of [place]

## 2021-12-15 NOTE — Progress Notes (Signed)
Physical Therapy Treatment Patient Details Name: Luis [name]. MRN: [id number] DOB: 10-Feb-1946 Today's Date: 12/15/2021   History of Present Illness The pt is a 76 y/o male who presented to ER for worsening SOB and weakness. Per chart EMS found pt room air pulse ox to be 90-91% and BP in 80s. Pt MRSA PCR was positive. Pt chest XRAY showed increased additional groundglass opacities throughout both lungs suggestive of possible mild edema. Underlying chronic pleural-parenchymal scarring. Pt with bilateral lower extremity cellulitis and pt also found to have atrial flutter with 2-1 block, PVCs and right bundle branch block. PMH significant for CAD status post CABG, multiple myeloma, diabetes mellitus with stage III chronic kidney disease, HTN, anxiety and depression. Pt is a resident at [facility].    PT Comments    Pt received in bed denying participation in any form of EOB or OOB mobility. Pt reports poor sleep quality last night with busy day today with OT treatment, paracentesis, and performing functional mobility for toileting throughout the day. Pt encouraged and educated on benefits of OOB mobility but to no avail.  Pt agreeable to bed level exercise. Pt performed with excellent form/technique throughout with HR in mid 50's BPM and Spo2 remaining >90% throughout. Pt left with all needs in place. D/c recs remain appropriate.   Recommendations for follow up therapy are one component of a multi-disciplinary discharge planning process, led by the attending physician.  Recommendations may be updated based on patient status, additional functional criteria and insurance authorization.  Follow Up Recommendations  Skilled nursing-short term rehab (<3 hours/day)     Assistance Recommended at Discharge Intermittent Supervision/Assistance  Patient can return home with the following A lot of help with walking and/or transfers;A lot of help with bathing/dressing/bathroom;Other (comment)   Equipment  Recommendations  Rolling walker (2 wheels)    Recommendations for Other Services       Precautions / Restrictions Precautions Precautions: Fall Restrictions Weight Bearing Restrictions: No     Mobility  Bed Mobility               General bed mobility comments: Declined OOB mobility Patient Response: Cooperative  Transfers                   General transfer comment: Declined OOB mobility    Ambulation/Gait               General Gait Details: Declined OOB mobility   Stairs             Wheelchair Mobility    Modified Rankin (Stroke Patients Only)       Balance       Sitting balance - Comments: Declined OOB mobility       Standing balance comment: Declined OOB mobility                            Cognition Arousal/Alertness: Awake/alert Behavior During Therapy: WFL for tasks assessed/performed Overall Cognitive Status: Within Functional Limits for tasks assessed                                          Exercises General Exercises - Lower Extremity Ankle Circles/Pumps: AROM, Strengthening, Both, 20 reps, Supine Quad Sets: AROM, Supine, Strengthening, Both, 10 reps Short Arc Quad: AROM, Supine, Strengthening, Both, 10 reps Heel Slides: AROM, Supine, Strengthening,  Both, 10 reps Hip ABduction/ADduction: AROM, Supine, Strengthening, Both, 10 reps Straight Leg Raises: AROM, Supine, Strengthening, Both, 10 reps Other Exercises Other Exercises: Benefits of OOB mobility for strength and function    General Comments General comments (skin integrity, edema, etc.): SPO2 > 90% on 2 L/min throughout LE therex      Pertinent Vitals/Pain Pain Assessment Pain Assessment: Faces Faces Pain Scale: Hurts a little bit Pain Location: bottom Pain Descriptors / Indicators: Aching Pain Intervention(s): Limited activity within patient's tolerance, Monitored during session    Home Living                           Prior Function            PT Goals (current goals can now be found in the care plan section) Progress towards PT goals: Progressing toward goals    Frequency    Min 2X/week      PT Plan Current plan remains appropriate    Co-evaluation              AM-PAC PT "6 Clicks" Mobility   Outcome Measure  Help needed turning from your back to your side while in a flat bed without using bedrails?: A Little Help needed moving from lying on your back to sitting on the side of a flat bed without using bedrails?: A Little Help needed moving to and from a bed to a chair (including a wheelchair)?: A Little Help needed standing up from a chair using your arms (e.g., wheelchair or bedside chair)?: A Lot Help needed to walk in hospital room?: A Lot Help needed climbing 3-5 steps with a railing? : A Lot 6 Click Score: 15    End of Session Equipment Utilized During Treatment: Oxygen Activity Tolerance: Patient tolerated treatment well;Patient limited by fatigue Patient left: in bed;with call bell/phone within reach;with bed alarm set Nurse Communication: Mobility status PT Visit Diagnosis: Other abnormalities of gait and mobility (R26.89);Muscle weakness (generalized) (M62.81);History of falling (Z91.81);Unsteadiness on feet (R26.81);Pain     Time: 1346-1401 PT Time Calculation (min) (ACUTE ONLY): 15 min  Charges:  $Therapeutic Exercise: 8-22 mins                     Salem Caster. Fairly IV, PT, DPT Physical Therapist- [facility]  12/15/2021, 2:43 PM

## 2021-12-15 NOTE — Progress Notes (Signed)
[facility]) Hospital Liaison Note   Received request from Transitions of Care Manager, Caryl Pina, for hospice services at home after discharge. Chart and patient information under review by [facility] physician. Hospice eligibility pending.   Spoke with Aaron Edelman to initiate education related to hospice philosophy, services, and team approach to care. Aaron Edelman verbalized understanding of information given. Per discussion, the plan is for patient to discharge home via [facility] once cleared to DC.    DME needs discussed. Patient has the following equipment in the home (Purchased privately): Film/video editor (backless) [place] Patient requests the following equipment for delivery: Advice worker (with back) [employer] O2 (2L/min)  Address verified and is correct in the chart. Aaron Edelman is the family member to contact to arrange time of equipment delivery.    Please send signed and completed DNR home with patient/family. Please provide prescriptions at discharge as needed to ensure ongoing symptom management.    [facility] information and contact numbers given to family & above information shared with TOC.   Please call with any questions/concerns.    Thank you for the opportunity to participate in this patient's care.   Daphene Calamity, MSW [facility] Liaison  [phone]

## 2021-12-15 NOTE — Progress Notes (Signed)
Patient has had 2 large bowel movements this shift. Very loose yellow stools per rectum.Patient denies any history of diarrhea. Gown, full linen change, peri care and condom cath replaced.

## 2021-12-15 NOTE — Procedures (Signed)
PROCEDURE SUMMARY:  Successful US guided paracentesis from right abdomen.  Yielded 2.7 L of clear yellow fluid.  No immediate complications.  Pt tolerated well.   Specimen sent for labs.  EBL < 2 mL  Theresa Duty, NP 12/15/2021 12:13 PM

## 2021-12-15 NOTE — TOC Progression Note (Addendum)
Transition of Care (TOC) - Progression Note    Patient Details  Name: Luis [name]. MRN: [id number] Date of Birth: 16-Aug-1946  Transition of Care [facility]) CM/SW [place], [place] Phone Number: 12/15/2021, 1:04 PM  Clinical Narrative:     Update: Luis [name] reports [facility] preference, CSW has informed [facility].   CSW notes per palliative NP, plan is for patient to return to [place] at discharge to be followed by home hospice.   CSW has provided patient's son Luis [name] with a list of hospice agencies for choice, pending choice at this time.   Expected Discharge Plan:  (ILF) Barriers to Discharge: Continued Medical Work up  Expected Discharge Plan and Services Expected Discharge Plan:  (ILF)                                               Social Determinants of Health (SDOH) Interventions    Readmission Risk Interventions No flowsheet data found.

## 2021-12-15 NOTE — Progress Notes (Signed)
Palliative: Attempted to meet with Mr. Fooks but he is off the floor at ultrasound. I return later in the day to find Mr. Biederman sitting up in the bed in his room.  He greets me, making and mostly keeping eye contact.  He is alert and oriented, able to make his basic needs known.  There is no family at bedside at this time.  We talk about his acute and chronic health concerns.  He told me that he went to ultrasound for fluid removal and it was better than he thought it was going to be.  We talk about a few "what if's and maybe's".  We talk about discharge plan.  We talked about the benefits of at home "treat the treatable" hospice care.  I shared that they will send an aide to help with bathing, an RN who can build relationships with Mr. Gombos.  He asks appropriate questions.  Mr. Remmers tells me that he would like to make it through spring and summer.  He shares that he and his siblings like to visit wineries for day trips.  He shares that he would also like to talk with spiritual care once he is outpatient.  I share that all of these things will be supported and provided through hospice.  Provider choice discussed.  He Building surveyor.  Mr. Linch is agreeable to for me to update his son via email.  Conference with attending, bedside nursing staff, transition of care related to patient condition, needs, goals of care, disposition. PMT to continue to follow.  Plan:   Continue to treat the treatable but no CPR or intubation.  Stay in the hospital until optimized.  Discharge back to ILF/[facility] with "treat the treatable" hospice care with [facility].  74 minutes Quinn Axe, NP Palliative medicine team Team phone [phone] Greater than 50% of this time was spent counseling and coordinating care related to the above assessment and plan.

## 2021-12-15 NOTE — Progress Notes (Signed)
Date of Admission:  12/09/2021      ID: Luis [name]. is a 76 y.o. male Principal Problem:   Sepsis ([place]) Active Problems:   Spinal stenosis   Type 2 diabetes mellitus without complication, without long-term current use of insulin (HCC)   CAD (coronary artery disease) of artery bypass graft   Depression with anxiety   Atrial fibrillation with RVR (HCC)   Cellulitis   Hypokalemia   Malnutrition of moderate degree    Subjective: Patient had paracentesis today and 2.7 L of fluid taken out and he is feeling better  Medications:   aspirin  81 mg Oral Daily   heparin  5,000 Units Subcutaneous Q8H   insulin aspart  0-15 Units Subcutaneous TID WC   lidocaine  1 patch Transdermal Q24H   loratadine  10 mg Oral Daily   midodrine  2.5 mg Oral TID WC   multivitamin with minerals  1 tablet Oral Daily   mupirocin ointment  1 application Nasal BID   PARoxetine  10 mg Oral QHS   pravastatin  40 mg Oral q1800    Objective: Vital signs in last 24 hours: Temp:  [97.7 F (36.5 C)-98.2 F (36.8 C)] 98.2 F (36.8 C) (02/07 0756) Pulse Rate:  [48-57] 56 (02/07 1110) Resp:  [18-20] 18 (02/07 0343) BP: (87-110)/(50-74) 102/53 (02/07 1110) SpO2:  [94 %-100 %] 96 % (02/07 1110) Weight:  [87.2 kg-88 kg] 88 kg (02/07 0700)  PHYSICAL EXAM:  General: Alert, cooperative, no distress, appears stated age.  Head: ulcerating wound scalp. Eyes: Drooping eyelids bilaterally ENT Nares normal. No drainage or sinus tenderness. Lips, mucosa, and tongue normal. No Thrush Neck: Supple, symmetrical, no adenopathy, thyroid: non tender no carotid bruit and no JVD. Back: No CVA tenderness. Lungs: b/l air entry Heart: regular Abdomen: Soft, non-tender,not distended. Bowel sounds normal. No masses Extremities:multiple excoriations legs much improved Superficial ulcers this Is also improved Lymph: Cervical, supraclavicular normal. Neurologic: Grossly non-focal  Lab Results Recent Labs     12/14/21 0409 12/15/21 0537  WBC 6.8 6.9  HGB 11.2* 10.9*  HCT 33.2* 32.2*  NA 131* 134*  K 3.8 3.8  CL 101 101  CO2 24 25  BUN 47* 51*  CREATININE 2.22* 2.27*   Liver Panel Recent Labs    12/15/21 0537  ALBUMIN 2.0*   Microbiology: 12/09/2021 blood culture 1 bottle out of 4 positive for Staph aureus Wound culture from 2-23 Staph aureus Blood culture from 12/11/2021 no growth 12/15/2021 body fluid culture sent  Peritoneal fluid analysis: 73 WBCs 17% lymphocytes and 12% neutrophils Total protein less than 3 Studies/Results: NM Pulmonary Perfusion  Result Date: 12/14/2021 CLINICAL DATA:  History of DVT, multiple falls, right pleural effusion EXAM: NUCLEAR MEDICINE PERFUSION LUNG SCAN TECHNIQUE: Perfusion images were obtained in multiple projections after intravenous injection of radiopharmaceutical. Ventilation scans intentionally deferred if perfusion scan and chest x-ray adequate for interpretation during COVID 19 epidemic. RADIOPHARMACEUTICALS:  4.09 mCi Tc-61mMAA IV COMPARISON:  12/14/2021 FINDINGS: Planar images of the lungs demonstrates heterogeneous radiotracer uptake throughout the pulmonary parenchyma. There are no wedge-shaped perfusion defects to suggest pulmonary embolus. Heterogeneous decreased uptake in the upper lobes consistent with known emphysema. Heterogeneous areas of decreased uptake within the lower lobes consistent with atelectasis on recent CT and x-ray. IMPRESSION: 1. By PISAPED criteria, no evidence of pulmonary embolus. No wedge-shaped perfusion defects. Heterogeneous uptake throughout the lungs consistent with emphysema and atelectasis as seen on recent CT. Electronically Signed   By: MLegrand Como  Owens Shark M.D.   On: 12/14/2021 15:00   [place] CHEST (PLEURAL EFFUSION)  Result Date: 12/14/2021 CLINICAL DATA:  Pleural effusion EXAM: CHEST ULTRASOUND COMPARISON:  None. FINDINGS: Limited ultrasound exam of the right chest was performed. Images demonstrate a small pleural  effusion, not amenable for safe image guided thoracentesis. No thoracentesis performed. IMPRESSION: Small right pleural effusion.  No thoracentesis performed. Electronically Signed   By: Albin Felling M.D.   On: 12/14/2021 15:45   US Paracentesis  Result Date: 12/15/2021 INDICATION: Patient with ascites presents today for a diagnostic and therapeutic paracentesis. EXAM: ULTRASOUND GUIDED PARACENTESIS MEDICATIONS: 1% lidocaine 10 mL COMPLICATIONS: None immediate. PROCEDURE: Informed written consent was obtained from the patient after a discussion of the risks, benefits and alternatives to treatment. A timeout was performed prior to the initiation of the procedure. Initial ultrasound scanning demonstrates a large amount of ascites within the right lower abdominal quadrant. The right lower abdomen was prepped and draped in the usual sterile fashion. 1% lidocaine was used for local anesthesia. Following this, a 19 gauge, 7-cm, Yueh catheter was introduced. An ultrasound image was saved for documentation purposes. The paracentesis was performed. The catheter was removed and a dressing was applied. The patient tolerated the procedure well without immediate post procedural complication. FINDINGS: A total of approximately 2.7 L of clear yellow fluid was removed. Samples were sent to the laboratory as requested by the clinical team. IMPRESSION: Successful ultrasound-guided paracentesis yielding 2.7 liters of peritoneal fluid. Read by: Soyla Dryer, NP Electronically Signed   By: Jerilynn Mages.  Shick M.D.   On: 12/15/2021 13:14   DG Chest Port 1 View  Result Date: 12/14/2021 CLINICAL DATA:  Right pleural effusion. EXAM: PORTABLE CHEST 1 VIEW COMPARISON:  CT chest dated December 12, 2021 FINDINGS: The heart is enlarged with evidence of prior coronary artery bypass grafting. Emphysematous changes of the lungs. Left pleural thickening and scarring is again noted. There also multiple small bilateral opacities, likely  scarring/atelectasis. Small right pleural effusion with atelectasis is unchanged. Osteopenia and degenerative changes of the thoracic spine. IMPRESSION: 1.  Stable cardiomegaly. 2. Emphysematous changes with multifocal bilateral atelectasis/scarring. Stable pleural thickening in the left upper lobe as well as in the mid left lung. Stable appearance of the small right pleural effusion with right basilar atelectasis. Electronically Signed   By: Keane Police D.O.   On: 12/14/2021 10:35   US LIVER DOPPLER  Result Date: 12/14/2021 CLINICAL DATA:  Cirrhosis.  Evaluate hepatic vasculature. EXAM: DUPLEX ULTRASOUND OF LIVER TECHNIQUE: Color and duplex Doppler ultrasound was performed to evaluate the hepatic in-flow and out-flow vessels. COMPARISON:  Chest CT - 12/12/2021 FINDINGS: There is diffuse increased slightly coarsened echogenicity of the hepatic parenchyma with nodularity hepatic contour compatible with clinical suspicion of cirrhosis. No discrete hepatic lesions. No intrahepatic biliary ductal dilatation. Portal Vein Velocities (normal hepatopetal directional flow) Main:  17 cm/sec Right:  7 cm/sec Left:  11 cm/sec Hepatic Vein Velocities (normal hepatofugal directional flow) Right:  29 cm/sec Middle:  20 cm/sec Left:  9 cm/sec Hepatic Artery Velocity:  162 cm/sec Splenic Vein Velocity:  11 cm/sec Spleen: Normal in size measuring 12.0 x 12.0 x 3.0 cm with calculated volume of 245 cc. Varices: None visualized Ascites: Small to moderate amount of intra-abdominal ascites. IMPRESSION: 1. Findings suggestive hepatic cirrhosis. No discrete hepatic lesions though further evaluation could be performed with nonemergent either cirrhotic protocol CT or (preferably) abdominal MRI as indicated. 2. Patent hepatic vasculature with normal directional flow. 3. Small to moderate  amount of intra-abdominal ascites without evidence of splenomegaly. Electronically Signed   By: Sandi Mariscal M.D.   On: 12/14/2021 10:26      Assessment/Plan:  Staph aureus bacteremia.  Due to skin.  Wounds.  He has ulcerating lesions on his scalp and his legs.  Culture taken from the scalp wound is Staph aureus.  Repeat blood culture no growth 2D echo shows a pulmonary artery pressure of 19 mm Because of low bioburden 1 out of 4 bottles being positive, clearly identified source of infection which is a skin do not suspect endocarditis.  Hence TEE which is a high risk procedure in this case due to pulmonary artery pressure hypertension is not needed.  Patient has lumbar stenosis and pain in his back and legs.  MRI of the spine did not show any infection.  Patient is on day 6 of cefazolin.  We will give cefazolin for another 2 weeks.  End date would be 12/31/2021.  AKI on CKD Ultrasound done today did not show any hydronephrosis or bladder distention.  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  This is is a transudate no evidence of SBP.  Pulmonary artery hypertension  CAD status post CABG Atrial flutter  Hyperlipidemia on pravastatin  Multiple myeloma  Diabetes mellitus on sliding scale  History of right total hip arthroplasty Discussed the management of the patient hospitalist.

## 2021-12-15 NOTE — Progress Notes (Signed)
Otho Ket, RN with Vascular team is here to place a PICC line for this patient. Patient is alert and oriented and has given consent

## 2021-12-16 DIAGNOSIS — R188 Other ascites: Secondary | ICD-10-CM

## 2021-12-16 DIAGNOSIS — R1319 Other dysphagia: Secondary | ICD-10-CM

## 2021-12-16 DIAGNOSIS — E440 Moderate protein-calorie malnutrition: Secondary | ICD-10-CM

## 2021-12-16 DIAGNOSIS — A419 Sepsis, unspecified organism: Secondary | ICD-10-CM | POA: Diagnosis not present

## 2021-12-16 DIAGNOSIS — K7460 Unspecified cirrhosis of liver: Secondary | ICD-10-CM

## 2021-12-16 DIAGNOSIS — R1310 Dysphagia, unspecified: Secondary | ICD-10-CM

## 2021-12-16 DIAGNOSIS — E876 Hypokalemia: Secondary | ICD-10-CM

## 2021-12-16 DIAGNOSIS — T17908A Unspecified foreign body in respiratory tract, part unspecified causing other injury, initial encounter: Secondary | ICD-10-CM | POA: Diagnosis not present

## 2021-12-16 LAB — BASIC METABOLIC PANEL
Anion gap: 6 (ref 5–15)
BUN: 40 mg/dL — ABNORMAL HIGH (ref 8–23)
CO2: 21 mmol/L — ABNORMAL LOW (ref 22–32)
Calcium: 6.3 mg/dL — CL (ref 8.9–10.3)
Chloride: 111 mmol/L (ref 98–111)
Creatinine, Ser: 1.81 mg/dL — ABNORMAL HIGH (ref 0.61–1.24)
GFR, Estimated: 38 mL/min — ABNORMAL LOW (ref >60)
Glucose, Bld: 103 mg/dL — ABNORMAL HIGH (ref 70–99)
Potassium: 3 mmol/L — ABNORMAL LOW (ref 3.5–5.1)
Sodium: 138 mmol/L (ref 135–145)

## 2021-12-16 LAB — CULTURE, BLOOD (ROUTINE X 2)
Culture: NO GROWTH
Culture: NO GROWTH
Special Requests: ADEQUATE
Special Requests: ADEQUATE

## 2021-12-16 LAB — PROTEIN, BODY FLUID (OTHER): Total Protein, Body Fluid Other: 0.5 g/dL

## 2021-12-16 LAB — GLUCOSE, CAPILLARY
Glucose-Capillary: 90 mg/dL (ref 70–99)
Glucose-Capillary: 197 mg/dL — ABNORMAL HIGH (ref 70–99)
Glucose-Capillary: 97 mg/dL (ref 70–99)
Glucose-Capillary: 144 mg/dL — ABNORMAL HIGH (ref 70–99)

## 2021-12-16 LAB — CYTOLOGY - NON PAP

## 2021-12-16 MED ORDER — POTASSIUM CHLORIDE CRYS ER 20 MEQ PO TBCR
40.0000 meq | EXTENDED_RELEASE_TABLET | Freq: Once | ORAL | Status: AC
  Administered 2021-12-16: 40 meq via ORAL
  Filled 2021-12-16: qty 2

## 2021-12-16 MED ORDER — CALCIUM CARBONATE 1250 (500 CA) MG PO TABS
1000.0000 mg | ORAL_TABLET | Freq: Two times a day (BID) | ORAL | Status: DC
  Administered 2021-12-16 – 2021-12-17 (×2): 1000 mg via ORAL
  Filled 2021-12-16 (×3): qty 2

## 2021-12-16 NOTE — Progress Notes (Addendum)
PROGRESS NOTE    Luis [name].  PVV:[id number] DOB: 01/06/1946 DOA: 12/09/2021 PCP: Tracie Harrier, MD   Brief Narrative: Taken from prior notes. Luis Ressel. is a 76 y.o. male with medical history significant for coronary artery disease status post CABG, history of multiple myeloma, diabetes mellitus with stage III chronic kidney disease, hypertension, anxiety and depression who presents to the ER for evaluation of worsening shortness of breath from his baseline and weakness. Initially met sepsis criteria with tachycardia, leukocytosis and lactic acidosis.  Found to have lower extremity cellulitis and chronic wounds, 1 out of 4 blood cultures with MSSA secondary to wounds. Repeat blood cultures negative.  TTE was negative for any vegetations, not a candidate for TEE due to severe pulmonary hypertension.  MRI of lumbar spine with no sign of infection. Infectious disease is on board and they are recommending continuation of cefazolin till 12/31/2021. PICC line was placed. Plan is to discharge to short-term rehab to complete IV antibiotics.  Patient lives alone. He will go back to his ILF after completing the antibiotics with home hospice, he would like to treat the treatables.  Subjective: Patient was seen and examined today.  No new complaints.  He wants to get out of the hospital.  Agrees to go to short-term rehab before returning to independent living facility as he needs IV antibiotics and would like to treat the treatables.  Assessment & Plan:   Principal Problem:   Sepsis ([place]) Active Problems:   Spinal stenosis   Type 2 diabetes mellitus without complication, without long-term current use of insulin (HCC)   CAD (coronary artery disease) of artery bypass graft   Depression with anxiety   Atrial fibrillation with RVR (HCC)   Cellulitis   Hypokalemia   Malnutrition of moderate degree  Sepsis/lower extremity cellulitis/bacteremia.  Initially met sepsis criteria with  leukocytosis, tachycardia and lactic acidosis.  Initial 1/4 blood cultures positive for MRSA secondary to lower extremity wounds.  MRI of lumbar spine with no sign of infection.  TTE was negative, not a candidate for TEE due to severe pulmonary hypertension.  PICC line was placed. ID is recommending cefazolin till 12/31/21. PT is recommending rehab, patient lives alone and would like to go back to his independent living facility with home hospice after completing the course of antibiotics at rehab.  He would like to treat the treatable. -Continue cefazolin   Acute hypoxic respiratory failure.  Likely multifactorial.  VQ scan on 12/14/2021 was negative for PE.  Has small pleural effusions which are not amenable for tap.  Some concern of aspiration pneumonia, so Flagyl was added to cefazolin.  Currently saturating 100% on 2 L of oxygen.  Respiration improved after getting paracentesis -Try weaning from oxygen  Pulmonary hypertension.  Found to have severe pulmonary hypertension on TTE.  Received a dose of albumin with Lasix.  Pulmonology was consulted and they signed off. Cardiology was also consulted and they would like to evaluate as outpatient.  Cirrhosis.  S/p paracentesis with improvement in respiratory status.  Removal of 2.7 L of transudative fluid.  No sign of SBP. -Outpatient follow-up  Dysphagia.  Esophagogram on 12/11/2021 shows corkscrewing, possible stricture and marked dysmotility.  Swallow evaluation with concern of aspiration risk. -Follow swallow team recommendations for diet. -Outpatient GI follow-up on discharge  Hypokalemia.  Potassium at 3. -Check magnesium -Replete potassium and monitor  Hypocalcemia.  Corrected calcium at 7.9 -Work on nutrition to correct hypoalbuminemia. -Start him on oral supplement  Chronic diarrhea.  No recent change.  GI pathogen panel was negative. -Continue with outpatient GI follow-up  HFpEF/CAD.  S/p bypass.  Appears well compensated.  TTE with  mild/moderate mitral regurgitation, severely reduced right ventricular function. Diuretics are being held due to softer blood pressure. -Continue to monitor  Hypertension.  Blood pressure within lower normal limit. -Continue holding home losartan -Continue with propranolol  Multiple myeloma. -Outpatient follow-up with oncology.  Currently not being treated.  T7 compression fracture.  Secondary to multiple myeloma -Continue with pain control  Objective: Vitals:   12/16/21 0000 12/16/21 0400 12/16/21 0829 12/16/21 1112  BP: 115/60 117/68 (!) 101/56 103/65  Pulse: (!) 59 61 (!) 59 (!) 58  Resp: _0 Temp: 98.5 F (36.9 C) 98.3 F (36.8 C) 98.2 F (36.8 C) 98.1 F (36.7 C)  TempSrc: Oral Oral Oral   SpO2: 97%  98% 100%  Weight:      Height:        Intake/Output Summary (Last 24 hours) at 12/16/2021 1430 Last data filed at 12/16/2021 1426 Gross per 24 hour  Intake 720 ml  Output 600 ml  Net 120 ml   Filed Weights   12/14/21 2000 12/15/21 0700 12/15/21 1951  Weight: 87.2 kg 88 kg 88 kg    Examination:  General exam: Appears calm and comfortable  Respiratory system: Clear to auscultation. Respiratory effort normal. Cardiovascular system: S1 & S2 heard, RRR.  Gastrointestinal system: Soft, nontender, nondistended, bowel sounds positive. Central nervous system: Alert and oriented. No focal neurological deficits. Extremities: Trace LE edema, no cyanosis, pulses intact and symmetrical. Skin: Multiple skin lesions and lower extremity bandages. Psychiatry: Judgement and insight appear normal.    DVT prophylaxis: Heparin Code Status: DNR Family Communication: Talked with son on phone. Disposition Plan:  Status is: Inpatient Remains inpatient appropriate because: Severity of illness Waiting for rehab bed for couple of weeks to complete the IV antibiotics.   Level of care: Med-Surg  All the records are reviewed and case discussed with Care Management/Social  Worker. Management plans discussed with the patient, nursing and they are in agreement.  Consultants:  Infectious disease Cardiology  Procedures:  Antimicrobials:  Cefazolin  Data Reviewed: I have personally reviewed following labs and imaging studies  CBC: Recent Labs  Lab 12/11/21 0950 12/12/21 0414 12/13/21 0544 12/14/21 0409 12/15/21 0537  WBC 8.9 6.7 5.8 6.8 6.9  HGB 11.4* 10.8* 10.3* 11.2* 10.9*  HCT 33.2* 32.2* 29.9* 33.2* 32.2*  MCV 109.2* 111.0* 108.7* 110.7* 113.4*  PLT 156 172 157 188 374   Basic Metabolic Panel: Recent Labs  Lab 12/10/21 0621 12/10/21 2125 12/11/21 0950 12/12/21 0414 12/13/21 0544 12/14/21 0409 12/15/21 0537 12/16/21 0530  NA 132*  --  131* 133* 133* 131* 134* 138  K 2.9*  --  4.4 4.3 3.5 3.8 3.8 3.0*  CL 96*  --  99 101 103 101 101 111  CO2 27  --  _1 21*  GLUCOSE 117*  --  124* 101* 115* 102* 117* 103*  BUN 51*  --  50* 45* 45* 47* 51* 40*  CREATININE 2.16*  --  2.07* 1.97* 2.12* 2.22* 2.27* 1.81*  CALCIUM 7.4*  --  7.3* 7.3* 7.4* 7.5* 7.6* 6.3*  MG 2.5* 2.3 2.3 2.2  --   --   --   --    GFR: Estimated Creatinine Clearance: 36.2 mL/min (A) (by C-G formula based on SCr of 1.81 mg/dL (H)). Liver Function  Tests: Recent Labs  Lab 12/15/21 0537  ALBUMIN 2.0*   No results for input(s): LIPASE, AMYLASE in the last 168 hours. No results for input(s): AMMONIA in the last 168 hours. Coagulation Profile: Recent Labs  Lab 12/10/21 0621  INR 1.5*   Cardiac Enzymes: No results for input(s): CKTOTAL, CKMB, CKMBINDEX, TROPONINI in the last 168 hours. BNP (last 3 results) No results for input(s): PROBNP in the last 8760 hours. HbA1C: No results for input(s): HGBA1C in the last 72 hours. CBG: Recent Labs  Lab 12/15/21 1327 12/15/21 1617 12/15/21 2008 12/16/21 0846 12/16/21 1117  GLUCAP 125* 73 152* 90 197*   Lipid Profile: No results for input(s): CHOL, HDL, LDLCALC, TRIG, CHOLHDL, LDLDIRECT in the last 72  hours. Thyroid Function Tests: No results for input(s): TSH, T4TOTAL, FREET4, T3FREE, THYROIDAB in the last 72 hours. Anemia Panel: No results for input(s): VITAMINB12, FOLATE, FERRITIN, TIBC, IRON, RETICCTPCT in the last 72 hours. Sepsis Labs: Recent Labs  Lab 12/10/21 0621 12/10/21 0929  PROCALCITON 1.00  --   LATICACIDVEN  --  2.5*    Recent Results (from the past 240 hour(s))  Blood Culture (routine x 2)     Status: Abnormal   Collection Time: 12/09/21 10:57 AM   Specimen: BLOOD  Result Value Ref Range Status   Specimen Description   Final    BLOOD LEFT WRIST Performed at [facility], [address] [place], [place] [address]    Special Requests   Final    BOTTLES DRAWN AEROBIC AND ANAEROBIC Blood Culture adequate volume Performed at [facility], [address] [place], [place] [address]    Culture  Setup Time   Final    GRAM POSITIVE COCCI AEROBIC BOTTLE ONLY CRITICAL RESULT CALLED TO, READ BACK BY AND VERIFIED WITH: JASON ROBBINS @ 0149 ON 12/10/21.Marland KitchenMarland KitchenTKR Performed at [facility], [place] [address] [place], [place] [address]    Culture STAPHYLOCOCCUS AUREUS (A)  Final   Report Status 12/12/2021 FINAL  Final   Organism ID, Bacteria STAPHYLOCOCCUS AUREUS  Final      Susceptibility   Staphylococcus aureus - MIC*    CIPROFLOXACIN <=0.5 SENSITIVE Sensitive     ERYTHROMYCIN <=0.25 SENSITIVE Sensitive     GENTAMICIN <=0.5 SENSITIVE Sensitive     OXACILLIN <=0.25 SENSITIVE Sensitive     TETRACYCLINE <=1 SENSITIVE Sensitive     VANCOMYCIN <=0.5 SENSITIVE Sensitive     TRIMETH/SULFA <=10 SENSITIVE Sensitive     CLINDAMYCIN <=0.25 SENSITIVE Sensitive     RIFAMPIN <=0.5 SENSITIVE Sensitive     Inducible Clindamycin NEGATIVE Sensitive     * STAPHYLOCOCCUS AUREUS  Blood Culture ID Panel (Reflexed)     Status: Abnormal   Collection Time: 12/09/21 10:57 AM  Result Value Ref Range Status   Enterococcus faecalis NOT DETECTED NOT DETECTED Final    Enterococcus Faecium NOT DETECTED NOT DETECTED Final   Listeria monocytogenes NOT DETECTED NOT DETECTED Final   Staphylococcus species DETECTED (A) NOT DETECTED Final    Comment: CRITICAL RESULT CALLED TO, READ BACK BY AND VERIFIED WITH: JASON ROBBINS @ 0149 ON 12/10/21.Marland KitchenMarland KitchenTKR    Staphylococcus aureus (BCID) DETECTED (A) NOT DETECTED Final    Comment: CRITICAL RESULT CALLED TO, READ BACK BY AND VERIFIED WITH: JASON ROBBINS @ 0149 ON 12/10/21.Marland KitchenMarland KitchenTKR    Staphylococcus epidermidis NOT DETECTED NOT DETECTED Final   Staphylococcus lugdunensis NOT DETECTED NOT DETECTED Final   Streptococcus species NOT DETECTED NOT DETECTED Final   Streptococcus agalactiae NOT DETECTED NOT  DETECTED Final   Streptococcus pneumoniae NOT DETECTED NOT DETECTED Final   Streptococcus pyogenes NOT DETECTED NOT DETECTED Final   A.calcoaceticus-baumannii NOT DETECTED NOT DETECTED Final   Bacteroides fragilis NOT DETECTED NOT DETECTED Final   Enterobacterales NOT DETECTED NOT DETECTED Final   Enterobacter cloacae complex NOT DETECTED NOT DETECTED Final   Escherichia coli NOT DETECTED NOT DETECTED Final   Klebsiella aerogenes NOT DETECTED NOT DETECTED Final   Klebsiella oxytoca NOT DETECTED NOT DETECTED Final   Klebsiella pneumoniae NOT DETECTED NOT DETECTED Final   Proteus species NOT DETECTED NOT DETECTED Final   Salmonella species NOT DETECTED NOT DETECTED Final   Serratia marcescens NOT DETECTED NOT DETECTED Final   Haemophilus influenzae NOT DETECTED NOT DETECTED Final   Neisseria meningitidis NOT DETECTED NOT DETECTED Final   Pseudomonas aeruginosa NOT DETECTED NOT DETECTED Final   Stenotrophomonas maltophilia NOT DETECTED NOT DETECTED Final   Candida albicans NOT DETECTED NOT DETECTED Final   Candida auris NOT DETECTED NOT DETECTED Final   Candida glabrata NOT DETECTED NOT DETECTED Final   Candida krusei NOT DETECTED NOT DETECTED Final   Candida parapsilosis NOT DETECTED NOT DETECTED Final   Candida tropicalis  NOT DETECTED NOT DETECTED Final   Cryptococcus neoformans/gattii NOT DETECTED NOT DETECTED Final   Meth resistant mecA/C and MREJ NOT DETECTED NOT DETECTED Final    Comment: Performed at [facility], [address] [place], [place] [address]  Blood Culture (routine x 2)     Status: None   Collection Time: 12/09/21 10:59 AM   Specimen: BLOOD  Result Value Ref Range Status   Specimen Description BLOOD RIGHT [facility]  Final   Special Requests   Final    BOTTLES DRAWN AEROBIC AND ANAEROBIC Blood Culture adequate volume   Culture   Final    NO GROWTH 5 DAYS Performed at [facility], [name] [place], [place] [address]    Report Status 12/14/2021 FINAL  Final  Urine Culture     Status: Abnormal   Collection Time: 12/09/21 11:19 AM   Specimen: In/Out Cath Urine  Result Value Ref Range Status   Specimen Description   Final    IN/OUT CATH URINE Performed at [facility], [address] [place], [place] [address]    Special Requests   Final    NONE Performed at [facility], [place], [place] [address]    Culture 1,000 COLONIES/mL ENTEROCOCCUS FAECALIS (A)  Final   Report Status 12/12/2021 FINAL  Final   Organism ID, Bacteria ENTEROCOCCUS FAECALIS (A)  Final      Susceptibility   Enterococcus faecalis - MIC*    AMPICILLIN <=2 SENSITIVE Sensitive     NITROFURANTOIN <=16 SENSITIVE Sensitive     VANCOMYCIN 1 SENSITIVE Sensitive     * 1,000 COLONIES/mL ENTEROCOCCUS FAECALIS  Resp Panel by RT-PCR (Flu A&B, Covid) Nasopharyngeal Swab     Status: None   Collection Time: 12/09/21 11:19 AM   Specimen: Nasopharyngeal Swab; Nasopharyngeal(NP) swabs in vial transport medium  Result Value Ref Range Status   SARS Coronavirus 2 by RT PCR NEGATIVE NEGATIVE Final    Comment: (NOTE) SARS-CoV-2 target nucleic acids are NOT DETECTED.  The SARS-CoV-2 RNA is generally detectable in upper respiratory specimens during the acute phase of  infection. The lowest concentration of SARS-CoV-2 viral copies this assay can detect is 138 copies/mL. A negative result does not preclude SARS-Cov-2 infection and should not be used as the sole basis for  treatment or other patient management decisions. A negative result may occur with  improper specimen collection/handling, submission of specimen other than nasopharyngeal swab, presence of viral mutation(s) within the areas targeted by this assay, and inadequate number of viral copies(<138 copies/mL). A negative result must be combined with clinical observations, patient history, and epidemiological information. The expected result is Negative.  Fact Sheet for Patients:  EntrepreneurPulse.com.au  Fact Sheet for Healthcare Providers:  IncredibleEmployment.be  This test is no t yet approved or cleared by the [place] FDA and  has been authorized for detection and/or diagnosis of SARS-CoV-2 by FDA under an Emergency Use Authorization (EUA). This EUA will remain  in effect (meaning this test can be used) for the duration of the COVID-19 declaration under Section 564(b)(1) of the Act, 21 U.S.C.section 360bbb-3(b)(1), unless the authorization is terminated  or revoked sooner.       Influenza A by PCR NEGATIVE NEGATIVE Final   Influenza B by PCR NEGATIVE NEGATIVE Final    Comment: (NOTE) The Xpert Xpress SARS-CoV-2/FLU/RSV plus assay is intended as an aid in the diagnosis of influenza from Nasopharyngeal swab specimens and should not be used as a sole basis for treatment. Nasal washings and aspirates are unacceptable for Xpert Xpress SARS-CoV-2/FLU/RSV testing.  Fact Sheet for Patients: EntrepreneurPulse.com.au  Fact Sheet for Healthcare Providers: IncredibleEmployment.be  This test is not yet approved or cleared by the [place] FDA and has been authorized for detection and/or diagnosis of SARS-CoV-2  by FDA under an Emergency Use Authorization (EUA). This EUA will remain in effect (meaning this test can be used) for the duration of the COVID-19 declaration under Section 564(b)(1) of the Act, 21 U.S.C. section 360bbb-3(b)(1), unless the authorization is terminated or revoked.  Performed at [facility], [address] [place], [place] [address]   MRSA Next Gen by PCR, Nasal     Status: Abnormal   Collection Time: 12/09/21  1:24 PM   Specimen: Nasal Mucosa; Nasal Swab  Result Value Ref Range Status   MRSA by PCR Next Gen DETECTED (A) NOT DETECTED Final    Comment: RESULT CALLED TO, READ BACK BY AND VERIFIED WITH: JON COOK 1525 12/09/21 MU (NOTE) The GeneXpert MRSA Assay (FDA approved for NASAL specimens only), is one component of a comprehensive MRSA colonization surveillance program. It is not intended to diagnose MRSA infection nor to guide or monitor treatment for MRSA infections. Test performance is not FDA approved in patients less than 23 years old. Performed at [facility], [address], [place], [place] [address]   Aerobic Culture w Gram Stain (superficial specimen)     Status: None   Collection Time: 12/10/21  5:31 PM   Specimen: SCALP; Wound  Result Value Ref Range Status   Specimen Description   Final    SCALP Performed at [facility], [address] [place], [place] [address]    Special Requests   Final    NONE Performed at [place] [facility], [address] [place], [place] [address]    Gram Stain   Final    FEW WBC PRESENT, PREDOMINANTLY MONONUCLEAR MODERATE GRAM POSITIVE COCCI Performed at [facility], [place] [address] [place], [place] [address]    Culture ABUNDANT STAPHYLOCOCCUS AUREUS  Final   Report Status 12/14/2021 FINAL  Final   Organism ID, Bacteria STAPHYLOCOCCUS AUREUS  Final      Susceptibility   Staphylococcus aureus - MIC*    CIPROFLOXACIN <=0.5 SENSITIVE Sensitive  ERYTHROMYCIN <=0.25  SENSITIVE Sensitive     GENTAMICIN <=0.5 SENSITIVE Sensitive     OXACILLIN 0.5 SENSITIVE Sensitive     TETRACYCLINE <=1 SENSITIVE Sensitive     VANCOMYCIN <=0.5 SENSITIVE Sensitive     TRIMETH/SULFA <=10 SENSITIVE Sensitive     CLINDAMYCIN <=0.25 SENSITIVE Sensitive     RIFAMPIN <=0.5 SENSITIVE Sensitive     Inducible Clindamycin NEGATIVE Sensitive     * ABUNDANT STAPHYLOCOCCUS AUREUS  CULTURE, BLOOD (ROUTINE X 2) w Reflex to ID Panel     Status: None   Collection Time: 12/11/21  6:59 AM   Specimen: BLOOD  Result Value Ref Range Status   Specimen Description BLOOD LEFT WRIST  Final   Special Requests   Final    BOTTLES DRAWN AEROBIC AND ANAEROBIC Blood Culture adequate volume   Culture   Final    NO GROWTH 5 DAYS Performed at [facility], [address] [place], [place] [address]    Report Status 12/16/2021 FINAL  Final  CULTURE, BLOOD (ROUTINE X 2) w Reflex to ID Panel     Status: None   Collection Time: 12/11/21  7:16 AM   Specimen: BLOOD  Result Value Ref Range Status   Specimen Description BLOOD LEFT ARM  Final   Special Requests   Final    BOTTLES DRAWN AEROBIC AND ANAEROBIC Blood Culture adequate volume   Culture   Final    NO GROWTH 5 DAYS Performed at [facility], [address] [place], [place] [address]    Report Status 12/16/2021 FINAL  Final  Gastrointestinal Panel by PCR , Stool     Status: None   Collection Time: 12/13/21 12:15 PM   Specimen: Stool  Result Value Ref Range Status   Campylobacter species NOT DETECTED NOT DETECTED Final   Plesimonas shigelloides NOT DETECTED NOT DETECTED Final   Salmonella species NOT DETECTED NOT DETECTED Final   Yersinia enterocolitica NOT DETECTED NOT DETECTED Final   Vibrio species NOT DETECTED NOT DETECTED Final   Vibrio cholerae NOT DETECTED NOT DETECTED Final   Enteroaggregative E coli (EAEC) NOT DETECTED NOT DETECTED Final   Enteropathogenic E coli (EPEC) NOT DETECTED NOT DETECTED Final    Enterotoxigenic E coli (ETEC) NOT DETECTED NOT DETECTED Final   Shiga like toxin producing E coli (STEC) NOT DETECTED NOT DETECTED Final   Shigella/Enteroinvasive E coli (EIEC) NOT DETECTED NOT DETECTED Final   Cryptosporidium NOT DETECTED NOT DETECTED Final   Cyclospora cayetanensis NOT DETECTED NOT DETECTED Final   Entamoeba histolytica NOT DETECTED NOT DETECTED Final   Giardia lamblia NOT DETECTED NOT DETECTED Final   Adenovirus F40/41 NOT DETECTED NOT DETECTED Final   Astrovirus NOT DETECTED NOT DETECTED Final   Norovirus GI/GII NOT DETECTED NOT DETECTED Final   Rotavirus A NOT DETECTED NOT DETECTED Final   Sapovirus (I, II, IV, and V) NOT DETECTED NOT DETECTED Final    Comment: Performed at [facility], [address] [place], [place] [address]  Body fluid culture w Gram Stain     Status: None (Preliminary result)   Collection Time: 12/15/21 11:20 AM   Specimen: PATH Cytology Peritoneal fluid  Result Value Ref Range Status   Specimen Description   Final    PERITONEAL Performed at [facility], [address], [place], [place] [address]    Special Requests   Final    NONE Performed at [facility], [address], [place], [place] [address]    Gram Stain  Final    WBC PRESENT,BOTH PMN AND MONONUCLEAR NO ORGANISMS SEEN CYTOSPIN SMEAR    Culture   Final    NO GROWTH < 24 HOURS Performed at [facility], [place] [address], [place], [place] [address]    Report Status PENDING  Incomplete     Radiology Studies: [place] CHEST (PLEURAL EFFUSION)  Result Date: 12/14/2021 CLINICAL DATA:  Pleural effusion EXAM: CHEST ULTRASOUND COMPARISON:  None. FINDINGS: Limited ultrasound exam of the right chest was performed. Images demonstrate a small pleural effusion, not amenable for safe image guided thoracentesis. No thoracentesis performed. IMPRESSION: Small right pleural effusion.  No thoracentesis performed. Electronically Signed   By: Albin Felling M.D.    On: 12/14/2021 15:45   US Paracentesis  Result Date: 12/15/2021 INDICATION: Patient with ascites presents today for a diagnostic and therapeutic paracentesis. EXAM: ULTRASOUND GUIDED PARACENTESIS MEDICATIONS: 1% lidocaine 10 mL COMPLICATIONS: None immediate. PROCEDURE: Informed written consent was obtained from the patient after a discussion of the risks, benefits and alternatives to treatment. A timeout was performed prior to the initiation of the procedure. Initial ultrasound scanning demonstrates a large amount of ascites within the right lower abdominal quadrant. The right lower abdomen was prepped and draped in the usual sterile fashion. 1% lidocaine was used for local anesthesia. Following this, a 19 gauge, 7-cm, Yueh catheter was introduced. An ultrasound image was saved for documentation purposes. The paracentesis was performed. The catheter was removed and a dressing was applied. The patient tolerated the procedure well without immediate post procedural complication. FINDINGS: A total of approximately 2.7 L of clear yellow fluid was removed. Samples were sent to the laboratory as requested by the clinical team. IMPRESSION: Successful ultrasound-guided paracentesis yielding 2.7 liters of peritoneal fluid. Read by: Soyla Dryer, NP Electronically Signed   By: Jerilynn Mages.  Shick M.D.   On: 12/15/2021 13:14   [place] EKG SITE RITE  Result Date: 12/15/2021 If Site Rite image not attached, placement could not be confirmed due to current cardiac rhythm.   Scheduled Meds:  (feeding supplement) PROSource Plus  30 mL Oral TID BM   aspirin  81 mg Oral Daily   calcium carbonate  1,000 mg of elemental calcium Oral BID WC   Chlorhexidine Gluconate Cloth  6 each Topical Daily   feeding supplement  1 Container Oral TID BM   heparin  5,000 Units Subcutaneous Q8H   insulin aspart  0-15 Units Subcutaneous TID WC   lidocaine  1 patch Transdermal Q24H   loratadine  10 mg Oral Daily   midodrine  2.5 mg Oral TID WC    multivitamin with minerals  1 tablet Oral Daily   PARoxetine  10 mg Oral QHS   pravastatin  40 mg Oral q1800   sodium chloride flush  10-40 mL Intracatheter Q12H   Continuous Infusions:  sodium chloride Stopped (12/12/21 1350)    ceFAZolin (ANCEF) IV 2 g (12/16/21 1422)   metronidazole 500 mg (12/16/21 1217)     LOS: 7 days   Time spent: 45 minutes. More than 50% of the time was spent in counseling/coordination of care  Lorella Nimrod, MD [facility]  If 7PM-7AM, please contact night-coverage [email or web address]  12/16/2021, 2:30 PM   This record has been created using Systems analyst. Errors have been sought and corrected,but may not always be located. Such creation errors do not reflect on the standard of care.

## 2021-12-16 NOTE — Progress Notes (Signed)
[facility] Liaison Note  Notified by family of patient/family request of [facility] Paliative services as patient will now pursue SNF w/ rehab services upon discharge.   [facility] hospital liaison will follow patient for discharge disposition.   Please call with any questions/concerns.    Thank you for the opportunity to participate in this patient's care.   Daphene Calamity, MSW [facility] Liaison  [phone]

## 2021-12-16 NOTE — Progress Notes (Signed)
PHARMACY CONSULT NOTE FOR:  OUTPATIENT  PARENTERAL ANTIBIOTIC THERAPY (OPAT)  Indication: MSSA bacteremia and skin infection Regimen: Cefazolin 2gm IV q8h End date: 12/31/2021  IV antibiotic discharge orders are pended. To discharging provider:  please sign these orders via discharge navigator,  Select New Orders & click on the button choice - Manage This Unsigned Work.     Thank you for allowing pharmacy to be a part of this patient's care.  Doreene Eland, PharmD, BCPS, BCIDP Work Cell: [phone] 12/16/2021 12:29 PM

## 2021-12-16 NOTE — Progress Notes (Addendum)
Occupational Therapy Treatment °Patient Details °Name: Luis A Kozlov Jr. °MRN: [id number] °DOB: 09/04/1946 °Today's Date: 12/16/2021 ° ° °History of present illness The pt is a 76 y/o male who presented to ER for worsening SOB and weakness. Per chart EMS found pt room air pulse ox to be 90-91% and BP in 80s. Pt MRSA PCR was positive. Pt chest XRAY showed “increased additional groundglass opacities throughout both lungs suggestive of possible mild edema. Underlying chronic pleural-parenchymal scarring.” Pt with bilateral lower extremity cellulitis and pt also found to have atrial flutter with 2-1 block, PVCs and right bundle branch block. PMH significant for CAD status post CABG, multiple myeloma, diabetes mellitus with stage III chronic kidney disease, HTN, anxiety and depression. Pt is a resident at [facility]. °  °OT comments ° Pt. was seated at the EOB finishing breakfast upon arrival. Pt. required minA transferring to the BSCommode, modA to perform toilet hygiene care, MaxA LE bathing and dressing, minA donning a gown, set-up for hand hygiene care, and mod A to reposition towards the HOB. SO2 95% on 2LO2. Pt. continues to  benefit from OT services for ADL training, A/E training, and pt./caregiver education. Pt. continues to be most appropriate for SNF level of care, with follow-up OT services upon discharge.  ° °Recommendations for follow up therapy are one component of a multi-disciplinary discharge planning process, led by the attending physician.  Recommendations may be updated based on patient status, additional functional criteria and insurance authorization. °   °Follow Up Recommendations ° Skilled nursing-short term rehab (<3 hours/day)  °  °Assistance Recommended at Discharge Frequent or constant Supervision/Assistance  °Patient can return home with the following ° A little help with walking and/or transfers;A lot of help with bathing/dressing/bathroom °  °Equipment Recommendations °    °  °Recommendations  for Other Services   ° °  °Precautions / Restrictions Precautions °Precautions: Fall °Restrictions °Weight Bearing Restrictions: No  ° ° °  ° °Mobility Bed Mobility °  °  °  °  °  °Sit to supine: Mod assist °  °  °  ° °Transfers °Overall transfer level: Needs assistance °Equipment used: Rolling walker (2 wheels) °  °Sit to Stand: Min assist °  °  °  °  °  °  °  °  °Balance   °  °  °  °  °  °  °  °  °  °  °  °  °  °  °  °  °  °  °   ° °ADL either performed or assessed with clinical judgement  ° °ADL   °  °  °Grooming: Minimal assistance °  °Upper Body Bathing: Minimal assistance °  °Lower Body Bathing: Maximal assistance °  °Upper Body Dressing : Minimal assistance °  °Lower Body Dressing: Maximal assistance °  °Toilet Transfer: Minimal assistance °  °Toileting- Clothing Manipulation and Hygiene: Moderate assistance °  °  °  °  °  °  ° °Extremity/Trunk Assessment Upper Extremity Assessment °Upper Extremity Assessment: Generalized weakness °  °  °  °  °  ° °Vision Patient Visual Report: No change from baseline °  °  °Perception   °  °Praxis   °  ° °Cognition   °  °  °  °  °  °  °  °  °  °  °  °  °  °  °  °  °  °  °  °  °  °   °  Exercises   ° °  °Shoulder Instructions   ° ° °  °General Comments    ° ° °Pertinent Vitals/ Pain       Pain Assessment °Pain Assessment: Faces °Pain Score: 4  °Faces Pain Scale: Hurts a little bit °Pain Descriptors / Indicators: Aching ° °Home Living   °  °  °  °  °  °  °  °  °  °  °  °  °  °  °  °  °  °  ° °  °Prior Functioning/Environment    °  °  °  °   ° °Frequency ° Min 2X/week  ° ° ° ° °  °Progress Toward Goals ° °OT Goals(current goals can now be found in the care plan section) ° Progress towards OT goals: Progressing toward goals ° °Acute Rehab OT Goals °Patient Stated Goal: To regain independence °OT Goal Formulation: With patient °Time For Goal Achievement: 12/27/21 °Potential to Achieve Goals: Good  °Plan Discharge plan remains appropriate;Frequency remains appropriate    ° °Co-evaluation ° ° °   °  °  °  °  ° °  °AM-PAC OT "6 Clicks" Daily Activity     °Outcome Measure ° ° Help from another person eating meals?: None °Help from another person taking care of personal grooming?: A Little °Help from another person toileting, which includes using toliet, bedpan, or urinal?: A Lot °Help from another person bathing (including washing, rinsing, drying)?: A Lot °Help from another person to put on and taking off regular upper body clothing?: A Little °Help from another person to put on and taking off regular lower body clothing?: A Lot °6 Click Score: 16 ° °  °End of Session Equipment Utilized During Treatment: Rolling walker (2 wheels);Oxygen ° °OT Visit Diagnosis: Unsteadiness on feet (R26.81);Muscle weakness (generalized) (M62.81);History of falling (Z91.81) °  °Activity Tolerance   °  °Patient Left in bed;with call bell/phone within reach;with bed alarm set °  °Nurse Communication   °  ° °   ° °Time: 0925-1005 °OT Time Calculation (min): 40 min ° °Charges: OT General Charges °$OT Visit: 1 Visit °OT Treatments °$Self Care/Home Management : 38-52 mins ° ° , MS, OTR/L  ° °  °12/16/2021, 1:18 PM °

## 2021-12-16 NOTE — Plan of Care (Signed)
  Problem: Health Behavior/Discharge Planning: Goal: Ability to manage health-related needs will improve Outcome: Progressing   Problem: Clinical Measurements: Goal: Ability to maintain clinical measurements within normal limits will improve Outcome: Progressing Goal: Diagnostic test results will improve Outcome: Progressing Goal: Respiratory complications will improve Outcome: Progressing   

## 2021-12-16 NOTE — Progress Notes (Signed)
Date and time results received: 12/16/21 6:26 AM  (use smartphrase ".now" to insert current time)  Test: Calcium Critical Value: 6.3  Name of Provider Notified: Neomia Glass, NP  Orders Received? Or Actions Taken?:  No new orders at this time

## 2021-12-16 NOTE — Treatment Plan (Signed)
Diagnosis: MSSA bacteremia with MSSA skin wounds Baseline Creatinine 1.8   Allergies  Allergen Reactions   Pravastatin Sodium Other (See Comments)   Statins Other (See Comments)    Muscle aches   Zocor [Simvastatin] Other (See Comments)    Muscle aches    OPAT Orders Cefazolin 2 grams IV every 8 hours until 12/31/21  [facility] Care Per Protocol:  Labs weekly while on IV antibiotics: _X_ CBC with differential  _X_ CMP   _X_ Please pull PIC at completion of IV antibiotics __ Please leave PIC in place until doctor has seen patient or been notified  Fax weekly labs to [phone]  Clinic Follow Up Appt with Dr.Samiyah Stupka  12/31/21 at 10.15 am   Call [phone] with any questions

## 2021-12-16 NOTE — Progress Notes (Signed)
Physical Therapy Treatment Patient Details Name: Luis [name]. MRN: [id number] DOB: 1946/01/24 Today's Date: 12/16/2021   History of Present Illness The pt is a 76 y/o male who presented to ER for worsening SOB and weakness. Per chart EMS found pt room air pulse ox to be 90-91% and BP in 80s. Pt MRSA PCR was positive. Pt chest XRAY showed increased additional groundglass opacities throughout both lungs suggestive of possible mild edema. Underlying chronic pleural-parenchymal scarring. Pt with bilateral lower extremity cellulitis and pt also found to have atrial flutter with 2-1 block, PVCs and right bundle branch block. PMH significant for CAD status post CABG, multiple myeloma, diabetes mellitus with stage III chronic kidney disease, HTN, anxiety and depression. Pt is a resident at [facility].    PT Comments    Pt received supine in bed, pleasant and agreeable to therapy. He alternates between agreeing to ambulation and asking for supine exercises - ultimately he agrees to OOB mobility. He required MOD A for bed mobility and STS from low surface. Ambulation was performed with CGA using SPC. He increased ambulation distance from 73f to 766fthis date. Pt states he needs to sit down for a rest break as soon as he re-enters room. Additional STS and prolonged sitting EOB were completed for pt clean up and partial linen change. Pt fatigued at end of session, states he is ready for a nap. Continue to recommend SNF at d/c to address functional mobility and endurance deficits. Would benefit from skilled PT to address above deficits and promote optimal return to PLOF.   Recommendations for follow up therapy are one component of a multi-disciplinary discharge planning process, led by the attending physician.  Recommendations may be updated based on patient status, additional functional criteria and insurance authorization.  Follow Up Recommendations  Skilled nursing-short term rehab (<3 hours/day)      Assistance Recommended at Discharge Intermittent Supervision/Assistance  Patient can return home with the following A lot of help with walking and/or transfers;A lot of help with bathing/dressing/bathroom;Other (comment)   Equipment Recommendations  Rolling walker (2 wheels)    Recommendations for Other Services       Precautions / Restrictions Precautions Precautions: Fall Restrictions Weight Bearing Restrictions: No     Mobility  Bed Mobility Overal bed mobility: Needs Assistance Bed Mobility: Supine to Sit, Sit to Supine     Supine to sit: Mod assist, HOB elevated Sit to supine: Mod assist   General bed mobility comments: MOD A to manage trunk to upright and BLE back to bed. Pain with bed mobility due to spinal stenosis.    Transfers Overall transfer level: Needs assistance Equipment used: Straight cane Transfers: Sit to/from Stand Sit to Stand: Mod assist           General transfer comment: MOD A to lift with multiple attempts from lower EOB. MIN A for steadying and stabilization from elevated EOB. 3 reps total within session.    Ambulation/Gait Ambulation/Gait assistance: Min guard Gait Distance (Feet): 70 Feet Assistive device: Straight cane Gait Pattern/deviations: Decreased step length - left, Decreased step length - right, Decreased dorsiflexion - right, Decreased dorsiflexion - left, Step-to pattern, Trunk flexed Gait velocity: decreased     General Gait Details: Pt ambulated 7015fith 2 standing rest breaks due to SOB and muscular fatigue. CGA for steadying.   Stairs             Wheelchair Mobility    Modified Rankin (Stroke Patients Only)  Balance Overall balance assessment: Needs assistance Sitting-balance support: No upper extremity supported, Feet supported Sitting balance-Leahy Scale: Good Sitting balance - Comments: good sitting balance at EOB; remained sitting for prolonged period of time during gown change and rest  breaks.   Standing balance support: Single extremity supported, During functional activity Standing balance-Leahy Scale: Fair Standing balance comment: requires MIN GUARD for standing balance during peri-care and ambulation with SPC                            Cognition Arousal/Alertness: Awake/alert Behavior During Therapy: WFL for tasks assessed/performed Overall Cognitive Status: Within Functional Limits for tasks assessed                                 General Comments: Pleasant and agreeable throughout.        Exercises Other Exercises Other Exercises: Pt remained sitting up and performed additional STS's after ambulation in order to change chuck pad on bed, don new gown and perform peri-care (MAX A peri-care). Multiple seated rest breaks required throughout session. O2 saturation remained >95%.    General Comments General comments (skin integrity, edema, etc.): SpO2 >95% on 2L O2 throughout session. O2 sat at 92% on RA upon arrival (resting).      Pertinent Vitals/Pain Pain Assessment Pain Assessment: Faces Faces Pain Scale: Hurts little more Pain Location: bottom Pain Descriptors / Indicators: Aching Pain Intervention(s): Monitored during session, Repositioned    Home Living                          Prior Function            PT Goals (current goals can now be found in the care plan section) Acute Rehab PT Goals Patient Stated Goal: to feel better and have a glass of wine PT Goal Formulation: With patient Time For Goal Achievement: 12/30/21 Potential to Achieve Goals: Fair    Frequency    Min 2X/week      PT Plan      Co-evaluation              AM-PAC PT "6 Clicks" Mobility   Outcome Measure  Help needed turning from your back to your side while in a flat bed without using bedrails?: A Little Help needed moving from lying on your back to sitting on the side of a flat bed without using bedrails?: A Lot Help  needed moving to and from a bed to a chair (including a wheelchair)?: A Lot Help needed standing up from a chair using your arms (e.g., wheelchair or bedside chair)?: A Lot Help needed to walk in hospital room?: A Little Help needed climbing 3-5 steps with a railing? : A Lot 6 Click Score: 14    End of Session Equipment Utilized During Treatment: Oxygen;Gait belt Activity Tolerance: Patient tolerated treatment well;Patient limited by fatigue Patient left: in bed;with call bell/phone within reach;with bed alarm set;with family/visitor present Nurse Communication: Mobility status PT Visit Diagnosis: Other abnormalities of gait and mobility (R26.89);Muscle weakness (generalized) (M62.81);History of falling (Z91.81);Unsteadiness on feet (R26.81);Pain Pain - Right/Left:  (Bilateral hips and buttocks)     Time: 1412-1500 PT Time Calculation (min) (ACUTE ONLY): 48 min  Charges:  $Therapeutic Exercise: 8-22 mins $Therapeutic Activity: 23-37 mins  Patrina Levering PT, DPT 12/16/21 4:07 PM [phone]

## 2021-12-16 NOTE — TOC Progression Note (Signed)
Transition of Care (TOC) - Progression Note    Patient Details  Name: Luis [name]. MRN: [id number] Date of Birth: 1945/12/27  Transition of Care [facility]) CM/SW [place], [place] Phone Number: 12/16/2021, 1:20 PM  Clinical Narrative:     CSW spoke with patient's son Luis [name] to provide bed offers, requested CSW email them to him and he will make a decision today.   CSW has [employer], television/film set facilities to choose from, pending choice at this time.    Expected Discharge Plan:  (ILF) Barriers to Discharge: Continued Medical Work up  Expected Discharge Plan and Services Expected Discharge Plan:  (ILF)                                               Social Determinants of Health (SDOH) Interventions    Readmission Risk Interventions No flowsheet data found.

## 2021-12-16 NOTE — Progress Notes (Signed)
Accessed PICC line in Right arm for blood draws this morning. Sent to lab.

## 2021-12-17 DIAGNOSIS — J90 Pleural effusion, not elsewhere classified: Secondary | ICD-10-CM

## 2021-12-17 DIAGNOSIS — R7881 Bacteremia: Secondary | ICD-10-CM | POA: Diagnosis not present

## 2021-12-17 DIAGNOSIS — R1310 Dysphagia, unspecified: Secondary | ICD-10-CM

## 2021-12-17 DIAGNOSIS — B9561 Methicillin susceptible Staphylococcus aureus infection as the cause of diseases classified elsewhere: Secondary | ICD-10-CM | POA: Diagnosis not present

## 2021-12-17 DIAGNOSIS — D649 Anemia, unspecified: Secondary | ICD-10-CM

## 2021-12-17 DIAGNOSIS — N179 Acute kidney failure, unspecified: Secondary | ICD-10-CM | POA: Diagnosis not present

## 2021-12-17 DIAGNOSIS — E119 Type 2 diabetes mellitus without complications: Secondary | ICD-10-CM

## 2021-12-17 DIAGNOSIS — I2721 Secondary pulmonary arterial hypertension: Secondary | ICD-10-CM

## 2021-12-17 DIAGNOSIS — K7031 Alcoholic cirrhosis of liver with ascites: Secondary | ICD-10-CM

## 2021-12-17 LAB — GLUCOSE, CAPILLARY
Glucose-Capillary: 94 mg/dL (ref 70–99)
Glucose-Capillary: 142 mg/dL — ABNORMAL HIGH (ref 70–99)
Glucose-Capillary: 121 mg/dL — ABNORMAL HIGH (ref 70–99)
Glucose-Capillary: 124 mg/dL — ABNORMAL HIGH (ref 70–99)

## 2021-12-17 LAB — BASIC METABOLIC PANEL
Anion gap: 7 (ref 5–15)
BUN: 45 mg/dL — ABNORMAL HIGH (ref 8–23)
CO2: 25 mmol/L (ref 22–32)
Calcium: 8.1 mg/dL — ABNORMAL LOW (ref 8.9–10.3)
Chloride: 102 mmol/L (ref 98–111)
Creatinine, Ser: 1.95 mg/dL — ABNORMAL HIGH (ref 0.61–1.24)
GFR, Estimated: 35 mL/min — ABNORMAL LOW (ref >60)
Glucose, Bld: 109 mg/dL — ABNORMAL HIGH (ref 70–99)
Potassium: 3.9 mmol/L (ref 3.5–5.1)
Sodium: 134 mmol/L — ABNORMAL LOW (ref 135–145)

## 2021-12-17 LAB — SARS CORONAVIRUS 2 (TAT 6-24 HRS): SARS Coronavirus 2: NEGATIVE

## 2021-12-17 LAB — MAGNESIUM: Magnesium: 1.8 mg/dL (ref 1.7–2.4)

## 2021-12-17 MED ORDER — MAGNESIUM SULFATE 2 GM/50ML IV SOLN
2.0000 g | Freq: Once | INTRAVENOUS | Status: AC
  Administered 2021-12-17: 2 g via INTRAVENOUS
  Filled 2021-12-17: qty 50

## 2021-12-17 NOTE — TOC Progression Note (Signed)
Transition of Care (TOC) - Progression Note    Patient Details  Name: Luis [name]. MRN: [id number] Date of Birth: 10/07/1946  Transition of Care [facility]) CM/SW [place], [place] Phone Number: 12/17/2021, 9:37 AM  Clinical Narrative:     Patient's son Luis [name] emailed CSW back stating they choose [facility].   Per Magda Paganini at [facility] they will have a bed available tomorrow first thing in the morning. Patient's son Luis [name] and MD informed.     Expected Discharge Plan:  (ILF) Barriers to Discharge: Continued Medical Work up  Expected Discharge Plan and Services Expected Discharge Plan:  (ILF)                                               Social Determinants of Health (SDOH) Interventions    Readmission Risk Interventions No flowsheet data found.

## 2021-12-17 NOTE — Progress Notes (Signed)
PROGRESS NOTE    [name].  HYQ:[id number] DOB: July 25, 1946 DOA: 12/09/2021 PCP: Tracie Harrier, MD   Brief Narrative: Taken from prior notes. Luis [name]. is a 76 y.o. male with medical history significant for coronary artery disease status post CABG, history of multiple myeloma, diabetes mellitus with stage III chronic kidney disease, hypertension, anxiety and depression who presents to the ER for evaluation of worsening shortness of breath from his baseline and weakness. Initially met sepsis criteria with tachycardia, leukocytosis and lactic acidosis.  Found to have lower extremity cellulitis and chronic wounds, 1 out of 4 blood cultures with MSSA secondary to wounds. Repeat blood cultures negative.  TTE was negative for any vegetations, not a candidate for TEE due to severe pulmonary hypertension.  MRI of lumbar spine with no sign of infection. Infectious disease is on board and they are recommending continuation of cefazolin till 12/31/2021. PICC line was placed. Plan is to discharge to short-term rehab to complete IV antibiotics.  Patient lives alone. He will go back to his ILF after completing the antibiotics at the rehab facility, he would like to treat the treatables.  Palliative care is recommending outpatient palliative care and moving to home hospice services when appropriate.  Should be able to go to [facility].  COVID test negative  Subjective: Patient was sitting comfortably in his bed, no new complaints.  He wants to go to rehab.  Assessment & Plan:   Principal Problem:   Sepsis ([place]) Active Problems:   Spinal stenosis   Type 2 diabetes mellitus without complication, without long-term current use of insulin (HCC)   CAD (coronary artery disease) of artery bypass graft   Depression with anxiety   Atrial fibrillation with RVR (HCC)   Cellulitis   Hypokalemia   Malnutrition of moderate degree   Aspiration into airway   Cirrhosis (HCC)    Dysphagia   Other ascites  Sepsis/lower extremity cellulitis/bacteremia.  Initially met sepsis criteria with leukocytosis, tachycardia and lactic acidosis.  Initial 1/4 blood cultures positive for MRSA secondary to lower extremity wounds.  MRI of lumbar spine with no sign of infection.  TTE was negative, not a candidate for TEE due to severe pulmonary hypertension.  PICC line was placed. ID is recommending cefazolin till 12/31/21. PT is recommending rehab, patient lives alone and would like to go back to his independent living facility with home hospice after completing the course of antibiotics at rehab.  He would like to treat the treatable. -Continue cefazolin till 12/31/2021.  PICC line in place  Acute hypoxic respiratory failure.  Likely multifactorial.  VQ scan on 12/14/2021 was negative for PE.  Has small pleural effusions which are not amenable for tap.  Some concern of aspiration pneumonia, so Flagyl was added to cefazolin.  Currently saturating 100% on 2 L of oxygen.  Respiration improved after getting paracentesis -Discontinue Flagyl as is completed the course for concern of aspiration pneumonia. -Try weaning from oxygen  Pulmonary hypertension.  Found to have severe pulmonary hypertension on TTE.  Received a dose of albumin with Lasix.  Pulmonology was consulted and they signed off. Cardiology was also consulted and they would like to evaluate as outpatient.  Cirrhosis.  S/p paracentesis with improvement in respiratory status.  Removal of 2.7 L of transudative fluid.  No sign of SBP. -Outpatient follow-up  Dysphagia.  Esophagogram on 12/11/2021 shows corkscrewing, possible stricture and marked dysmotility.  Swallow evaluation with concern of aspiration risk. -Follow swallow team recommendations  for diet. -Outpatient GI follow-up on discharge  Hypokalemia.  Resolved -Replete potassium and magnesium as needed and monitor  Hypocalcemia.  Corrected calcium improved to above 9. -Work on  nutrition to correct hypoalbuminemia. -Start him on oral supplement  Chronic diarrhea.  No recent change.  GI pathogen panel was negative. -Continue with outpatient GI follow-up  HFpEF/CAD.  S/p bypass.  Appears well compensated.  TTE with mild/moderate mitral regurgitation, severely reduced right ventricular function. Diuretics are being held due to softer blood pressure. -Continue to monitor  Hypertension.  Blood pressure within lower normal limit. -Continue holding home losartan and propranolol.  Multiple myeloma. -Outpatient follow-up with oncology.  Currently not being treated.  T7 compression fracture.  Secondary to multiple myeloma -Continue with pain control  Objective: Vitals:   12/16/21 2327 12/17/21 0500 12/17/21 0829 12/17/21 1117  BP: 102/60 106/62 (!) 108/56 103/63  Pulse: 65 72 (!) 56 (!) 59  Resp: _0 Temp: 97.9 F (36.6 C) 98.1 F (36.7 C) 97.7 F (36.5 C) (!) 97.5 F (36.4 C)  TempSrc:      SpO2: 97% 97% 96% 95%  Weight:  87.8 kg    Height:        Intake/Output Summary (Last 24 hours) at 12/17/2021 1546 Last data filed at 12/17/2021 0901 Gross per 24 hour  Intake 1820.31 ml  Output 360 ml  Net 1460.31 ml    Filed Weights   12/15/21 0700 12/15/21 1951 12/17/21 0500  Weight: 88 kg 88 kg 87.8 kg    Examination:  General.  Frail elderly man, in no acute distress.  Multiple skin lesions. Pulmonary.  Lungs clear bilaterally, normal respiratory effort. CV.  Regular rate and rhythm, no JVD, rub or murmur. Abdomen.  Soft, nontender, nondistended, BS positive. CNS.  Alert and oriented .  No focal neurologic deficit. Extremities.  No edema, no cyanosis, pulses intact and symmetrical. Psychiatry.  Judgment and insight appears normal.   DVT prophylaxis: Heparin Code Status: DNR Family Communication: Talked with son on phone. Disposition Plan:  Status is: Inpatient Remains inpatient appropriate because: Severity of illness Waiting for rehab  bed for couple of weeks to complete the IV antibiotics, most likely will be going to [facility].   Level of care: Med-Surg  All the records are reviewed and case discussed with Care Management/Social Worker. Management plans discussed with the patient, nursing and they are in agreement.  Consultants:  Infectious disease Cardiology  Procedures:  Antimicrobials:  Cefazolin  Data Reviewed: I have personally reviewed following labs and imaging studies  CBC: Recent Labs  Lab 12/11/21 0950 12/12/21 0414 12/13/21 0544 12/14/21 0409 12/15/21 0537  WBC 8.9 6.7 5.8 6.8 6.9  HGB 11.4* 10.8* 10.3* 11.2* 10.9*  HCT 33.2* 32.2* 29.9* 33.2* 32.2*  MCV 109.2* 111.0* 108.7* 110.7* 113.4*  PLT 156 172 157 188 591    Basic Metabolic Panel: Recent Labs  Lab 12/10/21 2125 12/11/21 0950 12/11/21 0950 12/12/21 0414 12/13/21 0544 12/14/21 0409 12/15/21 0537 12/16/21 0530 12/17/21 0555  NA  --  131*   < > 133* 133* 131* 134* 138 134*  K  --  4.4   < > 4.3 3.5 3.8 3.8 3.0* 3.9  CL  --  99   < > 101 103 101 101 111 102  CO2  --  23   < > _1 21* 25  GLUCOSE  --  124*   < > 101* 115* 102* 117* 103* 109*  BUN  --  50*   < > 45* 45* 47* 51* 40* 45*  CREATININE  --  2.07*   < > 1.97* 2.12* 2.22* 2.27* 1.81* 1.95*  CALCIUM  --  7.3*   < > 7.3* 7.4* 7.5* 7.6* 6.3* 8.1*  MG 2.3 2.3  --  2.2  --   --   --   --  1.8   < > = values in this interval not displayed.    GFR: Estimated Creatinine Clearance: 33.6 mL/min (A) (by C-G formula based on SCr of 1.95 mg/dL (H)). Liver Function Tests: Recent Labs  Lab 12/15/21 0537  ALBUMIN 2.0*    No results for input(s): LIPASE, AMYLASE in the last 168 hours. No results for input(s): AMMONIA in the last 168 hours. Coagulation Profile: No results for input(s): INR, PROTIME in the last 168 hours.  Cardiac Enzymes: No results for input(s): CKTOTAL, CKMB, CKMBINDEX, TROPONINI in the last 168 hours. BNP (last 3 results) No  results for input(s): PROBNP in the last 8760 hours. HbA1C: No results for input(s): HGBA1C in the last 72 hours. CBG: Recent Labs  Lab 12/16/21 1117 12/16/21 1647 12/16/21 2156 12/17/21 0832 12/17/21 1112  GLUCAP 197* 97 144* 94 142*    Lipid Profile: No results for input(s): CHOL, HDL, LDLCALC, TRIG, CHOLHDL, LDLDIRECT in the last 72 hours. Thyroid Function Tests: No results for input(s): TSH, T4TOTAL, FREET4, T3FREE, THYROIDAB in the last 72 hours. Anemia Panel: No results for input(s): VITAMINB12, FOLATE, FERRITIN, TIBC, IRON, RETICCTPCT in the last 72 hours. Sepsis Labs: No results for input(s): PROCALCITON, LATICACIDVEN in the last 168 hours.   Recent Results (from the past 240 hour(s))  Blood Culture (routine x 2)     Status: Abnormal   Collection Time: 12/09/21 10:57 AM   Specimen: BLOOD  Result Value Ref Range Status   Specimen Description   Final    BLOOD LEFT WRIST Performed at [facility], [address] [place], [place] [address]    Special Requests   Final    BOTTLES DRAWN AEROBIC AND ANAEROBIC Blood Culture adequate volume Performed at [facility], [address] [place], [place] [address]    Culture  Setup Time   Final    GRAM POSITIVE COCCI AEROBIC BOTTLE ONLY CRITICAL RESULT CALLED TO, READ BACK BY AND VERIFIED WITH: JASON ROBBINS @ 0149 ON 12/10/21.Marland KitchenMarland KitchenTKR Performed at [facility], [place] [address] [place], [place] [address]    Culture STAPHYLOCOCCUS AUREUS (A)  Final   Report Status 12/12/2021 FINAL  Final   Organism ID, Bacteria STAPHYLOCOCCUS AUREUS  Final      Susceptibility   Staphylococcus aureus - MIC*    CIPROFLOXACIN <=0.5 SENSITIVE Sensitive     ERYTHROMYCIN <=0.25 SENSITIVE Sensitive     GENTAMICIN <=0.5 SENSITIVE Sensitive     OXACILLIN <=0.25 SENSITIVE Sensitive     TETRACYCLINE <=1 SENSITIVE Sensitive     VANCOMYCIN <=0.5 SENSITIVE Sensitive     TRIMETH/SULFA <=10 SENSITIVE Sensitive     CLINDAMYCIN  <=0.25 SENSITIVE Sensitive     RIFAMPIN <=0.5 SENSITIVE Sensitive     Inducible Clindamycin NEGATIVE Sensitive     * STAPHYLOCOCCUS AUREUS  Blood Culture ID Panel (Reflexed)     Status: Abnormal   Collection Time: 12/09/21 10:57 AM  Result Value Ref Range Status   Enterococcus faecalis NOT DETECTED NOT DETECTED Final   Enterococcus Faecium NOT DETECTED NOT DETECTED Final   Listeria monocytogenes NOT DETECTED NOT DETECTED Final  Staphylococcus species DETECTED (A) NOT DETECTED Final    Comment: CRITICAL RESULT CALLED TO, READ BACK BY AND VERIFIED WITH: JASON ROBBINS @ 0149 ON 12/10/21.Marland KitchenMarland KitchenTKR    Staphylococcus aureus (BCID) DETECTED (A) NOT DETECTED Final    Comment: CRITICAL RESULT CALLED TO, READ BACK BY AND VERIFIED WITH: JASON ROBBINS @ 0149 ON 12/10/21.Marland KitchenMarland KitchenTKR    Staphylococcus epidermidis NOT DETECTED NOT DETECTED Final   Staphylococcus lugdunensis NOT DETECTED NOT DETECTED Final   Streptococcus species NOT DETECTED NOT DETECTED Final   Streptococcus agalactiae NOT DETECTED NOT DETECTED Final   Streptococcus pneumoniae NOT DETECTED NOT DETECTED Final   Streptococcus pyogenes NOT DETECTED NOT DETECTED Final   A.calcoaceticus-baumannii NOT DETECTED NOT DETECTED Final   Bacteroides fragilis NOT DETECTED NOT DETECTED Final   Enterobacterales NOT DETECTED NOT DETECTED Final   Enterobacter cloacae complex NOT DETECTED NOT DETECTED Final   Escherichia coli NOT DETECTED NOT DETECTED Final   Klebsiella aerogenes NOT DETECTED NOT DETECTED Final   Klebsiella oxytoca NOT DETECTED NOT DETECTED Final   Klebsiella pneumoniae NOT DETECTED NOT DETECTED Final   Proteus species NOT DETECTED NOT DETECTED Final   Salmonella species NOT DETECTED NOT DETECTED Final   Serratia marcescens NOT DETECTED NOT DETECTED Final   Haemophilus influenzae NOT DETECTED NOT DETECTED Final   Neisseria meningitidis NOT DETECTED NOT DETECTED Final   Pseudomonas aeruginosa NOT DETECTED NOT DETECTED Final    Stenotrophomonas maltophilia NOT DETECTED NOT DETECTED Final   Candida albicans NOT DETECTED NOT DETECTED Final   Candida auris NOT DETECTED NOT DETECTED Final   Candida glabrata NOT DETECTED NOT DETECTED Final   Candida krusei NOT DETECTED NOT DETECTED Final   Candida parapsilosis NOT DETECTED NOT DETECTED Final   Candida tropicalis NOT DETECTED NOT DETECTED Final   Cryptococcus neoformans/gattii NOT DETECTED NOT DETECTED Final   Meth resistant mecA/C and MREJ NOT DETECTED NOT DETECTED Final    Comment: Performed at [facility], [address] [place], [place] [address]  Blood Culture (routine x 2)     Status: None   Collection Time: 12/09/21 10:59 AM   Specimen: BLOOD  Result Value Ref Range Status   Specimen Description BLOOD RIGHT [facility]  Final   Special Requests   Final    BOTTLES DRAWN AEROBIC AND ANAEROBIC Blood Culture adequate volume   Culture   Final    NO GROWTH 5 DAYS Performed at [facility], [address] [place], [place] [address]    Report Status 12/14/2021 FINAL  Final  Urine Culture     Status: Abnormal   Collection Time: 12/09/21 11:19 AM   Specimen: In/Out Cath Urine  Result Value Ref Range Status   Specimen Description   Final    IN/OUT CATH URINE Performed at [facility], [address] [place], [place] [address]    Special Requests   Final    NONE Performed at [facility] [address] [place], [place] [address]    Culture 1,000 COLONIES/mL ENTEROCOCCUS FAECALIS (A)  Final   Report Status 12/12/2021 FINAL  Final   Organism ID, Bacteria ENTEROCOCCUS FAECALIS (A)  Final      Susceptibility   Enterococcus faecalis - MIC*    AMPICILLIN <=2 SENSITIVE Sensitive     NITROFURANTOIN <=16 SENSITIVE Sensitive     VANCOMYCIN 1 SENSITIVE Sensitive     * 1,000 COLONIES/mL ENTEROCOCCUS FAECALIS  Resp Panel by RT-PCR (Flu A&B, Covid) Nasopharyngeal Swab     Status: None   Collection Time:  12/09/21 11:19 AM   Specimen:  Nasopharyngeal Swab; Nasopharyngeal(NP) swabs in vial transport medium  Result Value Ref Range Status   SARS Coronavirus 2 by RT PCR NEGATIVE NEGATIVE Final    Comment: (NOTE) SARS-CoV-2 target nucleic acids are NOT DETECTED.  The SARS-CoV-2 RNA is generally detectable in upper respiratory specimens during the acute phase of infection. The lowest concentration of SARS-CoV-2 viral copies this assay can detect is 138 copies/mL. A negative result does not preclude SARS-Cov-2 infection and should not be used as the sole basis for treatment or other patient management decisions. A negative result may occur with  improper specimen collection/handling, submission of specimen other than nasopharyngeal swab, presence of viral mutation(s) within the areas targeted by this assay, and inadequate number of viral copies(<138 copies/mL). A negative result must be combined with clinical observations, patient history, and epidemiological information. The expected result is Negative.  Fact Sheet for Patients:  EntrepreneurPulse.com.au  Fact Sheet for Healthcare Providers:  IncredibleEmployment.be  This test is no t yet approved or cleared by the [place] FDA and  has been authorized for detection and/or diagnosis of SARS-CoV-2 by FDA under an Emergency Use Authorization (EUA). This EUA will remain  in effect (meaning this test can be used) for the duration of the COVID-19 declaration under Section 564(b)(1) of the Act, 21 U.S.C.section 360bbb-3(b)(1), unless the authorization is terminated  or revoked sooner.       Influenza A by PCR NEGATIVE NEGATIVE Final   Influenza B by PCR NEGATIVE NEGATIVE Final    Comment: (NOTE) The Xpert Xpress SARS-CoV-2/FLU/RSV plus assay is intended as an aid in the diagnosis of influenza from Nasopharyngeal swab specimens and should not be used as a sole basis for treatment. Nasal washings and aspirates are unacceptable for  Xpert Xpress SARS-CoV-2/FLU/RSV testing.  Fact Sheet for Patients: EntrepreneurPulse.com.au  Fact Sheet for Healthcare Providers: IncredibleEmployment.be  This test is not yet approved or cleared by the [place] FDA and has been authorized for detection and/or diagnosis of SARS-CoV-2 by FDA under an Emergency Use Authorization (EUA). This EUA will remain in effect (meaning this test can be used) for the duration of the COVID-19 declaration under Section 564(b)(1) of the Act, 21 U.S.C. section 360bbb-3(b)(1), unless the authorization is terminated or revoked.  Performed at [facility], [address] [place], [place] [address]   MRSA Next Gen by PCR, Nasal     Status: Abnormal   Collection Time: 12/09/21  1:24 PM   Specimen: Nasal Mucosa; Nasal Swab  Result Value Ref Range Status   MRSA by PCR Next Gen DETECTED (A) NOT DETECTED Final    Comment: RESULT CALLED TO, READ BACK BY AND VERIFIED WITH: JON COOK 1525 12/09/21 MU (NOTE) The GeneXpert MRSA Assay (FDA approved for NASAL specimens only), is one component of a comprehensive MRSA colonization surveillance program. It is not intended to diagnose MRSA infection nor to guide or monitor treatment for MRSA infections. Test performance is not FDA approved in patients less than 84 years old. Performed at [facility], [address] [place], [place] [address]   Aerobic Culture w Gram Stain (superficial specimen)     Status: None   Collection Time: 12/10/21  5:31 PM   Specimen: SCALP; Wound  Result Value Ref Range Status   Specimen Description   Final    SCALP Performed at [facility], [address], [place], [place] [address]    Special Requests   Final  NONE Performed at [facility], [place], [place] [address]    Gram Stain   Final    FEW WBC PRESENT, PREDOMINANTLY MONONUCLEAR MODERATE GRAM POSITIVE COCCI Performed at  [facility], [place] [address] [place], [place] [address]    Culture ABUNDANT STAPHYLOCOCCUS AUREUS  Final   Report Status 12/14/2021 FINAL  Final   Organism ID, Bacteria STAPHYLOCOCCUS AUREUS  Final      Susceptibility   Staphylococcus aureus - MIC*    CIPROFLOXACIN <=0.5 SENSITIVE Sensitive     ERYTHROMYCIN <=0.25 SENSITIVE Sensitive     GENTAMICIN <=0.5 SENSITIVE Sensitive     OXACILLIN 0.5 SENSITIVE Sensitive     TETRACYCLINE <=1 SENSITIVE Sensitive     VANCOMYCIN <=0.5 SENSITIVE Sensitive     TRIMETH/SULFA <=10 SENSITIVE Sensitive     CLINDAMYCIN <=0.25 SENSITIVE Sensitive     RIFAMPIN <=0.5 SENSITIVE Sensitive     Inducible Clindamycin NEGATIVE Sensitive     * ABUNDANT STAPHYLOCOCCUS AUREUS  CULTURE, BLOOD (ROUTINE X 2) w Reflex to ID Panel     Status: None   Collection Time: 12/11/21  6:59 AM   Specimen: BLOOD  Result Value Ref Range Status   Specimen Description BLOOD LEFT WRIST  Final   Special Requests   Final    BOTTLES DRAWN AEROBIC AND ANAEROBIC Blood Culture adequate volume   Culture   Final    NO GROWTH 5 DAYS Performed at [facility], [address] [place], [place] [address]    Report Status 12/16/2021 FINAL  Final  CULTURE, BLOOD (ROUTINE X 2) w Reflex to ID Panel     Status: None   Collection Time: 12/11/21  7:16 AM   Specimen: BLOOD  Result Value Ref Range Status   Specimen Description BLOOD LEFT ARM  Final   Special Requests   Final    BOTTLES DRAWN AEROBIC AND ANAEROBIC Blood Culture adequate volume   Culture   Final    NO GROWTH 5 DAYS Performed at [facility], [address] [place], [place] [address]    Report Status 12/16/2021 FINAL  Final  Gastrointestinal Panel by PCR , Stool     Status: None   Collection Time: 12/13/21 12:15 PM   Specimen: Stool  Result Value Ref Range Status   Campylobacter species NOT DETECTED NOT DETECTED Final   Plesimonas shigelloides NOT DETECTED NOT DETECTED Final   Salmonella species NOT  DETECTED NOT DETECTED Final   Yersinia enterocolitica NOT DETECTED NOT DETECTED Final   Vibrio species NOT DETECTED NOT DETECTED Final   Vibrio cholerae NOT DETECTED NOT DETECTED Final   Enteroaggregative E coli (EAEC) NOT DETECTED NOT DETECTED Final   Enteropathogenic E coli (EPEC) NOT DETECTED NOT DETECTED Final   Enterotoxigenic E coli (ETEC) NOT DETECTED NOT DETECTED Final   Shiga like toxin producing E coli (STEC) NOT DETECTED NOT DETECTED Final   Shigella/Enteroinvasive E coli (EIEC) NOT DETECTED NOT DETECTED Final   Cryptosporidium NOT DETECTED NOT DETECTED Final   Cyclospora cayetanensis NOT DETECTED NOT DETECTED Final   Entamoeba histolytica NOT DETECTED NOT DETECTED Final   Giardia lamblia NOT DETECTED NOT DETECTED Final   Adenovirus F40/41 NOT DETECTED NOT DETECTED Final   Astrovirus NOT DETECTED NOT DETECTED Final   Norovirus GI/GII NOT DETECTED NOT DETECTED Final   Rotavirus A NOT DETECTED NOT DETECTED Final   Sapovirus (I, II, IV, and V) NOT DETECTED NOT DETECTED Final    Comment: Performed at [facility], [place], [place] [address]  Body fluid culture w Gram Stain     Status: None (Preliminary result)   Collection Time: 12/15/21 11:20 AM   Specimen: PATH Cytology Peritoneal fluid  Result Value Ref Range Status   Specimen Description   Final    PERITONEAL Performed at [facility], [address] [place], [place] [address]    Special Requests   Final    NONE Performed at [facility], [address] [place], [place] [address]    Gram Stain   Final    WBC PRESENT,BOTH PMN AND MONONUCLEAR NO ORGANISMS SEEN CYTOSPIN SMEAR    Culture   Final    NO GROWTH 2 DAYS Performed at [facility], [place] [address], [place], [place] [address]    Report Status PENDING  Incomplete  SARS CORONAVIRUS 2 (TAT 6-24 HRS) Nasopharyngeal Nasopharyngeal Swab     Status: None   Collection Time: 12/16/21  2:23 PM   Specimen:  Nasopharyngeal Swab  Result Value Ref Range Status   SARS Coronavirus 2 NEGATIVE NEGATIVE Final    Comment: (NOTE) SARS-CoV-2 target nucleic acids are NOT DETECTED.  The SARS-CoV-2 RNA is generally detectable in upper and lower respiratory specimens during the acute phase of infection. Negative results do not preclude SARS-CoV-2 infection, do not rule out co-infections with other pathogens, and should not be used as the sole basis for treatment or other patient management decisions. Negative results must be combined with clinical observations, patient history, and epidemiological information. The expected result is Negative.  Fact Sheet for Patients: SugarRoll.be  Fact Sheet for Healthcare Providers: https://www.woods-mathews.com/  This test is not yet approved or cleared by the [place] FDA and  has been authorized for detection and/or diagnosis of SARS-CoV-2 by FDA under an Emergency Use Authorization (EUA). This EUA will remain  in effect (meaning this test can be used) for the duration of the COVID-19 declaration under Se ction 564(b)(1) of the Act, 21 U.S.C. section 360bbb-3(b)(1), unless the authorization is terminated or revoked sooner.  Performed at [facility], [place] [address] [place], [place] [address]       Radiology Studies: [place] EKG SITE RITE  Result Date: 12/15/2021 If [facility] image not attached, placement could not be confirmed due to current cardiac rhythm.   Scheduled Meds:  (feeding supplement) PROSource Plus  30 mL Oral TID BM   aspirin  81 mg Oral Daily   Chlorhexidine Gluconate Cloth  6 each Topical Daily   feeding supplement  1 Container Oral TID BM   heparin  5,000 Units Subcutaneous Q8H   insulin aspart  0-15 Units Subcutaneous TID WC   lidocaine  1 patch Transdermal Q24H   loratadine  10 mg Oral Daily   midodrine  2.5 mg Oral TID WC   multivitamin with minerals  1 tablet Oral Daily    PARoxetine  10 mg Oral QHS   pravastatin  40 mg Oral q1800   sodium chloride flush  10-40 mL Intracatheter Q12H   Continuous Infusions:  sodium chloride Stopped (12/12/21 1350)    ceFAZolin (ANCEF) IV 2 g (12/17/21 0700)     LOS: 8 days   Time spent: 40 minutes. More than 50% of the time was spent in counseling/coordination of care  Lorella Nimrod, MD [facility]  If 7PM-7AM, please contact night-coverage [email or web address]  12/17/2021, 3:46 PM   This record has been created using Systems analyst. Errors have  been sought and corrected,but may not always be located. Such creation errors do not reflect on the standard of care.

## 2021-12-17 NOTE — Progress Notes (Signed)
Pt transferred to room 152 from 2A. Pt oriented to room and call bell in reach. Pt denies pain and all belongings are at the bedside.    12/17/21 1117  Vitals  Temp (!) 97.5 F (36.4 C)  BP 103/63  MAP (mmHg) 75  BP Location Left Arm  BP Method Automatic  Patient Position (if appropriate) Sitting  Pulse Rate (!) 59  Pulse Rate Source Monitor  Resp 18  MEWS COLOR  MEWS Score Color Green  Oxygen Therapy  SpO2 95 %  O2 Device Nasal Cannula

## 2021-12-17 NOTE — Progress Notes (Signed)
PULMONOLOGY         Date: 12/17/2021,   MRN# [id number] Luis [name]. 05/25/1946     AdmissionWeight: 83.5 kg                 CurrentWeight: 87.8 kg   Referring physician: Dr Si Raider   CHIEF COMPLAINT:   Pulmonary hypertension with pleural effusions    HISTORY OF PRESENT ILLNESS   This is a 76 yo male with history of coronary artery disease status post CABG, history of multiple myeloma, diabetes mellitus with stage III chronic kidney disease, hypertension, anxiety and depression who presents to the ER for evaluation of worsening shortness of breath from his baseline and weakness. He had moderate oxygen desaturation in the field, bmp an dcbc reviewed by me with renal failure and nephrotic syndrome with lactic acidosis and electorlyte derrangements.  He is being treated for cellilitis of LE and acute on chronic CHF exacerbation. TTE was done with findings of significant pulmonary hypertension.    12/14/21- patient is somewhat unchanged, he did not have much fluid in pleural space so we cancelled throacentesis. The VQ san was negative for PE. There is atelectasis and emphysema bilaterally. He is in discussion with Palliative regarding GOC and I recommend hospice.   12/17/21-patient is improved substantially.  He is declining hospice and wants to live with his 2 sons. He seems depressed he talked about his wife who passed in Nov 2020 and how difficult life has been after losing her. He is on 2L/min [place] and is lucid.    PAST MEDICAL HISTORY   Past Medical History:  Diagnosis Date   Angina pectoris ([place])    Anxiety    Arthritis    CHF (congestive heart failure) ([place])    Coronary artery disease    Depression    Diabetes mellitus without complication ([place])    Patient takes Metformin.   Diverticulosis    GERD (gastroesophageal reflux disease)    Hyperlipidemia    Hypertension    Multiple myeloma ([place])    Multiple myeloma ([place]) 03/27/2015   Myocardial infarction [facility]) April  2001   widowmaker   Shortness of breath dyspnea    Sleep apnea    No CPAP @ present   Spinal stenosis    Squamous cell carcinoma of skin 02/19/2014   Left dorsal hand. WD SCC with superficial infiltration. Re-shaved 04/23/2014. Re-shaved 09/05/2014.   Squamous cell carcinoma of skin 09/08/2020   Left ant scalp. WD SCC.   Squamous cell carcinoma of skin 02/04/2021   R hand dorsum, EDC      SURGICAL HISTORY   Past Surgical History:  Procedure Laterality Date   CARDIAC CATHETERIZATION     CARPAL TUNNEL RELEASE     CATARACT EXTRACTION     COLONOSCOPY WITH PROPOFOL N/A 11/11/2015   Procedure: COLONOSCOPY WITH PROPOFOL;  Surgeon: Lollie Sails, MD;  Location: [facility] ENDOSCOPY;  Service: Endoscopy;  Laterality: N/A;   CORONARY ARTERY BYPASS GRAFT     EYE SURGERY Bilateral    Cataract Extraction   INGUINAL HERNIA REPAIR     JOINT REPLACEMENT Right 2008   Right Total Hip Replacement   PILONIDAL CYST EXCISION     ROTATOR CUFF REPAIR     TOTAL HIP ARTHROPLASTY Right    VENTRAL HERNIA REPAIR N/A 08/15/2015   Procedure: VENTRAL HERNIA REPAIR WITH MESH ;  Surgeon: Leonie Green, MD;  Location: [facility] ORS;  Service: General;  Laterality: N/A;  FAMILY HISTORY   Family History  Problem Relation Age of Onset   Heart disease Father    Stroke Mother    Prostate cancer Maternal Grandfather 73     SOCIAL HISTORY   Social History   Tobacco Use   Smoking status: Former    Packs/day: 1.50    Years: 40.00    Pack years: 60.00    Types: Cigarettes    Quit date: 02/24/2000    Years since quitting: 21.8   Smokeless tobacco: Former    Quit date: 03/04/2000  Vaping Use   Vaping Use: Never used  Substance Use Topics   Alcohol use: No   Drug use: No     MEDICATIONS    Home Medication:    Current Medication:  Current Facility-Administered Medications:    (feeding supplement) PROSource Plus liquid 30 mL, 30 mL, Oral, TID BM, Wouk, Ailene Rud, MD, 30 mL at 12/17/21  0840   0.9 %  sodium chloride infusion, , Intravenous, PRN, Sharion Settler, NP, Stopped at 12/12/21 1350   acetaminophen (TYLENOL) tablet 650 mg, 650 mg, Oral, Q6H PRN, 650 mg at 12/17/21 0848 **OR** acetaminophen (TYLENOL) suppository 650 mg, 650 mg, Rectal, Q6H PRN, Agbata, Tochukwu, MD   ALPRAZolam Duanne Moron) tablet 0.5 mg, 0.5 mg, Oral, BID PRN, Si Raider, Ailene Rud, MD, 0.5 mg at 12/16/21 2234   aspirin chewable tablet 81 mg, 81 mg, Oral, Daily, Agbata, Tochukwu, MD, 81 mg at 12/17/21 0840   calcium carbonate (OS-CAL - dosed in mg of elemental calcium) tablet 1,000 mg of elemental calcium, 1,000 mg of elemental calcium, Oral, BID WC, Amin, Sumayya, MD, 1,000 mg of elemental calcium at 12/17/21 0840   ceFAZolin (ANCEF) IVPB 2g/100 mL premix, 2 g, Intravenous, Q8H, Amin, Sumayya, MD, Last Rate: 200 mL/hr at 12/17/21 0700, 2 g at 12/17/21 0700   Chlorhexidine Gluconate Cloth 2 % PADS 6 each, 6 each, Topical, Daily, Ottie Glazier, MD, 6 each at 12/17/21 0839   dextrose 50 % solution 50 mL, 1 ampule, Intravenous, Once PRN, Wouk, Ailene Rud, MD   feeding supplement (BOOST / RESOURCE BREEZE) liquid 1 Container, 1 Container, Oral, TID BM, Wouk, Ailene Rud, MD, 1 Container at 12/17/21 0840   heparin injection 5,000 Units, 5,000 Units, Subcutaneous, Q8H, Wouk, Ailene Rud, MD, 5,000 Units at 12/17/21 0500   insulin aspart (novoLOG) injection 0-15 Units, 0-15 Units, Subcutaneous, TID WC, Agbata, Tochukwu, MD, 3 Units at 12/16/21 1212   ipratropium-albuterol (DUONEB) 0.5-2.5 (3) MG/3ML nebulizer solution 3 mL, 3 mL, Nebulization, Q6H PRN, Agbata, Tochukwu, MD, 3 mL at 12/10/21 1009   lidocaine (LIDODERM) 5 % 1 patch, 1 patch, Transdermal, Q24H, Sharion Settler, NP, 1 patch at 12/16/21 2223   loratadine (CLARITIN) tablet 10 mg, 10 mg, Oral, Daily, Wouk, Ailene Rud, MD, 10 mg at 12/17/21 0840   metroNIDAZOLE (FLAGYL) IVPB 500 mg, 500 mg, Intravenous, Q12H, Wouk, Ailene Rud, MD, Last Rate: 100 mL/hr at  12/17/21 0100, 500 mg at 12/17/21 0100   midodrine (PROAMATINE) tablet 2.5 mg, 2.5 mg, Oral, TID WC, Rhyker Silversmith, MD, 2.5 mg at 12/17/21 0840   multivitamin with minerals tablet 1 tablet, 1 tablet, Oral, Daily, Wouk, Ailene Rud, MD, 1 tablet at 12/17/21 0840   ondansetron (ZOFRAN) tablet 4 mg, 4 mg, Oral, Q6H PRN, 4 mg at 12/11/21 2137 **OR** ondansetron (ZOFRAN) injection 4 mg, 4 mg, Intravenous, Q6H PRN, Agbata, Tochukwu, MD   PARoxetine (PAXIL) tablet 10 mg, 10 mg, Oral, QHS, Agbata, Tochukwu, MD, 10 mg at 12/16/21  2234   pravastatin (PRAVACHOL) tablet 40 mg, 40 mg, Oral, q1800, Agbata, Tochukwu, MD, 40 mg at 12/16/21 1753   sodium chloride flush (NS) 0.9 % injection 10-40 mL, 10-40 mL, Intracatheter, Q12H, Lanney Gins, Jacqui Headen, MD, 10 mL at 12/17/21 0841   sodium chloride flush (NS) 0.9 % injection 10-40 mL, 10-40 mL, Intracatheter, PRN, Ottie Glazier, MD    ALLERGIES   Pravastatin sodium, Statins, and Zocor [simvastatin]     REVIEW OF SYSTEMS    Review of Systems:  Gen:  Denies  fever, sweats, chills weigh loss  HEENT: Denies blurred vision, double vision, ear pain, eye pain, hearing loss, nose bleeds, sore throat Cardiac:  No dizziness, chest pain or heaviness, chest tightness,edema Resp:   Denies cough or sputum porduction, shortness of breath,wheezing, hemoptysis,  Gi: Denies swallowing difficulty, stomach pain, nausea or vomiting, diarrhea, constipation, bowel incontinence Gu:  Denies bladder incontinence, burning urine Ext:   Denies Joint pain, stiffness or swelling Skin: Denies  skin rash, easy bruising or bleeding or hives Endoc:  Denies polyuria, polydipsia , polyphagia or weight change Psych:   Denies depression, insomnia or hallucinations   Other:  All other systems negative   VS: BP (!) 108/56 (BP Location: Left Arm)    Pulse (!) 56    Temp 97.7 F (36.5 C)    Resp 15    Ht '5\' 7"'  (1.702 m)    Wt 87.8 kg    SpO2 96%    BMI 30.32 kg/m      PHYSICAL EXAM     GENERAL:NAD, no fevers, chills, no weakness no fatigue HEAD: Normocephalic, atraumatic.  EYES: Pupils equal, round, reactive to light. Extraocular muscles intact. No scleral icterus.  MOUTH: Moist mucosal membrane. Dentition intact. No abscess noted.  EAR, NOSE, THROAT: Clear without exudates. No external lesions.  NECK: Supple. No thyromegaly. No nodules. No JVD.  PULMONARY: mild rhonchi at bases bilaterally CARDIOVASCULAR: S1 and S2. Regular rate and rhythm. No murmurs, rubs, or gallops. No edema. Pedal pulses 2+ bilaterally.  GASTROINTESTINAL: Soft, nontender, nondistended. No masses. Positive bowel sounds. No hepatosplenomegaly.  MUSCULOSKELETAL: No swelling, clubbing, or edema. Range of motion full in all extremities.  NEUROLOGIC: Cranial nerves II through XII are intact. No gross focal neurological deficits. Sensation intact. Reflexes intact.  SKIN: No ulceration, lesions, rashes, or cyanosis. Skin warm and dry. Turgor intact.  PSYCHIATRIC: Mood, affect within normal limits. The patient is awake, alert and oriented x 3. Insight, judgment intact.       IMAGING    DG Chest 2 View  Result Date: 12/01/2021 CLINICAL DATA:  Fall, tachycardia EXAM: CHEST - 2 VIEW COMPARISON:  09/07/2021 FINDINGS: Interstitial coarsening secondary to parenchymal scarring better appreciated on CT examination of 11/07/2020 within the left mid and lower lung zone is unchanged. Resultant mild left-sided volume loss again noted. No superimposed confluent pulmonary infiltrate. No pneumothorax or pleural effusion. Coronary artery bypass grafting has been performed. Cardiac size is within normal limits. Pulmonary vascularity is normal. Osseous structures are age-appropriate. No acute bone abnormality. IMPRESSION: No active cardiopulmonary disease. Electronically Signed   By: Fidela Salisbury M.D.   On: 12/01/2021 20:21   CT Head Wo Contrast  Result Date: 12/01/2021 CLINICAL DATA:  Trip and fall striking head.  EXAM: CT HEAD WITHOUT CONTRAST TECHNIQUE: Contiguous axial images were obtained from the base of the skull through the vertex without intravenous contrast. RADIATION DOSE REDUCTION: This exam was performed according to the departmental dose-optimization program which includes automated exposure control,  adjustment of the mA and/or kV according to patient size and/or use of iterative reconstruction technique. COMPARISON:  Provided head CT 12/21/2008 FINDINGS: Brain: Generalized cerebral atrophy. No intracranial hemorrhage, mass effect, or midline shift. No hydrocephalus. The basilar cisterns are patent. Mild periventricular and deep chronic small vessel ischemic change. No evidence of territorial infarct or acute ischemia. No extra-axial or intracranial fluid collection. Vascular: Atherosclerosis of skullbase vasculature without hyperdense vessel or abnormal calcification. Skull: No fracture or focal lesion. Sinuses/Orbits: No acute findings. Other: Right frontal scalp hematoma. IMPRESSION: 1. Right frontal scalp hematoma. No acute intracranial abnormality. No skull fracture. 2. Generalized atrophy and chronic small vessel ischemic change. Electronically Signed   By: Keith Rake M.D.   On: 12/01/2021 18:39   CT CHEST WO CONTRAST  Result Date: 12/12/2021 CLINICAL DATA:  Aspiration hypoxia. Fluid overload. Infection. Shortness of breath. EXAM: CT CHEST WITHOUT CONTRAST TECHNIQUE: Multidetector CT imaging of the chest was performed following the standard protocol without IV contrast. RADIATION DOSE REDUCTION: This exam was performed according to the departmental dose-optimization program which includes automated exposure control, adjustment of the mA and/or kV according to patient size and/or use of iterative reconstruction technique. COMPARISON:  Chest radiograph 12/11/2021. Esophagram 12/11/2021. CT 12/09/2021 FINDINGS: Cardiovascular: Cardiac enlargement. No pericardial effusions. Postoperative changes in the  mediastinum consistent with coronary bypass. Calcification of the aorta. No aneurysm. Mediastinum/Nodes: Thyroid gland is unremarkable. Esophagus is decompressed. Scattered lymph nodes throughout the mediastinum without pathologic enlargement, likely reactive. Lungs/Pleura: Small bilateral pleural effusions. Pleural calcification on the left may represent post inflammatory process. Diffuse emphysematous changes in the lungs. Atelectasis or consolidation in both lung bases with patchy areas of airspace infiltration seen in both lungs, most prominently in the right upper lung. Pulmonary infiltration is progressing since previous study, possibly progressing pneumonia or interval aspiration. Upper Abdomen: Upper abdominal ascites. Liver morphology consistent with hepatic cirrhosis. Mild gynecomastia. Musculoskeletal: Sternotomy wires. Degenerative changes in the spine and shoulders. Prominent thoracic kyphosis and mild scoliosis convex towards the right. Multiple lucent lesions, some with expansile change, are demonstrated in several ribs and in the spine. This is consistent with history of multiple myeloma. Several vertebral compression deformities are present. There is an acute or subacute appearing fracture of the body of a midthoracic vertebra, likely T7. This likely represents a pathologic fracture. No change since the previous study from several days ago. IMPRESSION: 1. Bilateral pleural effusions with progressing infiltrates, particularly on the right. This could indicate progression of pneumonia or interval aspiration. 2. Stable appearance of multiple bone lesions and vertebral fractures of differing ages, including an acute appearing fracture probably at T7. No change since previous study. 3. Changes of hepatic cirrhosis with upper abdominal ascites. 4. Aortic atherosclerosis. 5. Emphysematous changes. Electronically Signed   By: Lucienne Capers M.D.   On: 12/12/2021 17:58   CT CHEST WO CONTRAST  Result  Date: 12/09/2021 CLINICAL DATA:  Shortness of breath. Generalized weakness. Recent fall. Sepsis protocol initiated by EMS. EXAM: CT CHEST WITHOUT CONTRAST TECHNIQUE: Multidetector CT imaging of the chest was performed following the standard protocol without IV contrast. RADIATION DOSE REDUCTION: This exam was performed according to the departmental dose-optimization program which includes automated exposure control, adjustment of the mA and/or kV according to patient size and/or use of iterative reconstruction technique. COMPARISON:  Portable chest obtained earlier today. Chest CTA dated 11/07/2020. FINDINGS: Cardiovascular: Atheromatous calcifications, including the coronary arteries and aorta. Mildly enlarged heart, unchanged. Mediastinum/Nodes: No enlarged mediastinal or axillary lymph nodes. Thyroid gland,  trachea, and esophagus demonstrate no significant findings. Lungs/Pleura: Bilateral dependent atelectasis. Stable parenchymal scarring in the posterolateral lingula. Minimal right upper lobe dependent atelectasis with improvement. No significant change in bilateral bullous changes in a centrilobular distribution. Interval minimal right pleural effusion. Small left pleural effusion without significant change. Stable pleural calcifications on the left. Upper Abdomen: Interval small amount of ascites. Contracted gallbladder with no significant change in multiple small calculi measuring up to 6 mm in maximum diameter each. No gallbladder wall thickening or pericholecystic fluid. Ingested tablet fragments in the stomach and duodenum. Musculoskeletal: Previously noted multiple lytic lesions in the bony skeleton compatible with previously reported multiple myeloma. Stable T10 fracture deformity without acute fracture lines. An oblique fracture is again demonstrated in the T7 vertebral body extending through the pedicle on the right without additional posterior extension. IMPRESSION: 1. No interval findings suspicious  for pneumonia. 2. Bilateral dependent atelectasis and lingular scarring. 3. Interval minimal right pleural effusion and stable small left pleural effusion. 4. Stable bony changes of multiple myeloma with an acute pathological fracture of the T7 vertebral body and old compression fracture of the T10 vertebral body. 5. Stable changes of COPD with centrilobular emphysema. 6. Stable dense calcific coronary artery and aortic atherosclerosis. 7. Cholelithiasis without evidence of cholecystitis. 8. Stable probable posttraumatic or postinflammatory left pleural calcifications. Aortic Atherosclerosis (ICD10-I70.0) and Emphysema (ICD10-J43.9). Electronically Signed   By: Claudie Revering M.D.   On: 12/09/2021 16:17   CT Cervical Spine Wo Contrast  Result Date: 12/01/2021 CLINICAL DATA:  Trip and fall.  Neck trauma. EXAM: CT CERVICAL SPINE WITHOUT CONTRAST TECHNIQUE: Multidetector CT imaging of the cervical spine was performed without intravenous contrast. Multiplanar CT image reconstructions were also generated. RADIATION DOSE REDUCTION: This exam was performed according to the departmental dose-optimization program which includes automated exposure control, adjustment of the mA and/or kV according to patient size and/or use of iterative reconstruction technique. COMPARISON:  CT cervical spine 11/07/2020 FINDINGS: Alignment: Straightening of normal lordosis. No traumatic subluxation. Skull base and vertebrae: No acute fracture. Intact dens and skull base. C1-C2 degenerative change. Bridging anterior as well as posterior osteophytes with bony ankylosis. No osteophyte fracture. The bones are under mineralized with heterogeneous marrow, no bony destructive process. Soft tissues and spinal canal: No prevertebral fluid or swelling. No visible canal hematoma. Disc levels: Diffuse disc space narrowing with ossification of scattered disc spaces, unchanged. Flowing osteophytes anterior and posteriorly with bony ankylosis. Multilevel  neural foraminal narrowing without high-grade canal stenosis. Upper chest: Chronic left pleuroparenchymal thickening. No acute findings. Other: Advanced carotid calcifications. IMPRESSION: 1. No acute fracture or traumatic subluxation of the cervical spine. 2. Unchanged appearance of diffuse degenerative disc disease and flowing anterior and posterior osteophytes, typical of ankylosis. Electronically Signed   By: Keith Rake M.D.   On: 12/01/2021 18:45   MR LUMBAR SPINE WO CONTRAST  Result Date: 12/12/2021 CLINICAL DATA:  History of epidural steroid injections in lumbar spine, bacteremia, concern for osteomyelitis. EXAM: MRI LUMBAR SPINE WITHOUT CONTRAST TECHNIQUE: Multiplanar, multisequence MR imaging of the lumbar spine was performed. No intravenous contrast was administered. COMPARISON:  04/06/2019 FINDINGS: Segmentation:  Standard. Alignment:  Levocurvature of the lumbar spine. Vertebrae: No acute fracture or suspicious osseous lesion. No evidence of discitis or osteomyelitis. Unchanged degenerative changes at the anterior superior endplate of L5. Vertebral body endplates are otherwise intact.Redemonstrated cystic lesion in the left iliac bone, which appears unchanged compared to 2020. Conus medullaris and cauda equina: Conus extends to the T12 level. Conus  and cauda equina appear normal. No epidural collection. Paraspinal and other soft tissues: Negative. Disc levels: L1-L2: No significant disc bulge. No spinal canal stenosis or neural foraminal narrowing. L2-L3: Mild disc bulge. Mild facet arthropathy. Narrowing of the lateral recesses. No spinal canal stenosis or neural foraminal narrowing. L3-L4: Mild disc bulge. Moderate right and mild left facet arthropathy. Narrowing of the lateral recesses. No spinal canal stenosis or neural foraminal narrowing. L4-L5: Mild disc bulge. Severe facet arthropathy. Effacement of the lateral recesses. Moderate spinal canal stenosis, which appears similar to the prior  exam. Unchanged moderate bilateral neural foraminal narrowing. L5-S1: No significant disc bulge. Moderate right and mild left facet arthropathy. No spinal canal stenosis or neural foraminal narrowing. IMPRESSION: 1. No acute osseous abnormality. No evidence of discitis osteomyelitis. 2. L4-L5 moderate spinal canal stenosis and moderate bilateral neural foraminal narrowing, unchanged. Effacement of the lateral recesses at this level likely compresses the descending L5 nerve roots. 3. Narrowing of the lateral recesses at L2-L3 and L3-L4 could affect the descending L3 and L4 nerve roots, respectively. Electronically Signed   By: Merilyn Baba M.D.   On: 12/12/2021 03:16   NM Pulmonary Perfusion  Result Date: 12/14/2021 CLINICAL DATA:  History of DVT, multiple falls, right pleural effusion EXAM: NUCLEAR MEDICINE PERFUSION LUNG SCAN TECHNIQUE: Perfusion images were obtained in multiple projections after intravenous injection of radiopharmaceutical. Ventilation scans intentionally deferred if perfusion scan and chest x-ray adequate for interpretation during COVID 19 epidemic. RADIOPHARMACEUTICALS:  4.09 mCi Tc-39mMAA IV COMPARISON:  12/14/2021 FINDINGS: Planar images of the lungs demonstrates heterogeneous radiotracer uptake throughout the pulmonary parenchyma. There are no wedge-shaped perfusion defects to suggest pulmonary embolus. Heterogeneous decreased uptake in the upper lobes consistent with known emphysema. Heterogeneous areas of decreased uptake within the lower lobes consistent with atelectasis on recent CT and x-ray. IMPRESSION: 1. By PISAPED criteria, no evidence of pulmonary embolus. No wedge-shaped perfusion defects. Heterogeneous uptake throughout the lungs consistent with emphysema and atelectasis as seen on recent CT. Electronically Signed   By: MRanda NgoM.D.   On: 12/14/2021 15:00   [place] (PLEURAL EFFUSION)  Result Date: 12/14/2021 CLINICAL DATA:  Pleural effusion EXAM: CHEST ULTRASOUND  COMPARISON:  None. FINDINGS: Limited ultrasound exam of the right chest was performed. Images demonstrate a small pleural effusion, not amenable for safe image guided thoracentesis. No thoracentesis performed. IMPRESSION: Small right pleural effusion.  No thoracentesis performed. Electronically Signed   By: YAlbin FellingM.D.   On: 12/14/2021 15:45   [place]NAL  Result Date: 12/11/2021 CLINICAL DATA:  Acute kidney injury EXAM: RENAL / URINARY TRACT ULTRASOUND COMPLETE COMPARISON:  None. FINDINGS: Right Kidney: Renal measurements: 10.4 x 4.6 x 5.4 cm = volume: 135.2 mL. Echogenicity within normal limits. No mass or hydronephrosis visualized. Left Kidney: Renal measurements: 9.4 x 5 x 4 cm = volume: 99.1 mL. Echogenicity within normal limits. No mass or hydronephrosis visualized. Bladder: Appears normal for degree of bladder distention. Suboptimal evaluation due to bandage per technologist. Other: Ascites. IMPRESSION: No hydronephrosis. Ascites. Electronically Signed   By: PMacy MisM.D.   On: 12/11/2021 16:54   [place]acentesis  Result Date: 12/15/2021 INDICATION: Patient with ascites presents today for a diagnostic and therapeutic paracentesis. EXAM: ULTRASOUND GUIDED PARACENTESIS MEDICATIONS: 1% lidocaine 10 mL COMPLICATIONS: None immediate. PROCEDURE: Informed written consent was obtained from the patient after a discussion of the risks, benefits and alternatives to treatment. A timeout was performed prior to the initiation of the procedure. Initial ultrasound scanning  demonstrates a large amount of ascites within the right lower abdominal quadrant. The right lower abdomen was prepped and draped in the usual sterile fashion. 1% lidocaine was used for local anesthesia. Following this, a 19 gauge, 7-cm, Yueh catheter was introduced. An ultrasound image was saved for documentation purposes. The paracentesis was performed. The catheter was removed and a dressing was applied. The patient tolerated the  procedure well without immediate post procedural complication. FINDINGS: A total of approximately 2.7 L of clear yellow fluid was removed. Samples were sent to the laboratory as requested by the clinical team. IMPRESSION: Successful ultrasound-guided paracentesis yielding 2.7 liters of peritoneal fluid. Read by: Soyla Dryer, NP Electronically Signed   By: Jerilynn Mages.  Shick M.D.   On: 12/15/2021 13:14   DG Chest Port 1 View  Result Date: 12/14/2021 CLINICAL DATA:  Right pleural effusion. EXAM: PORTABLE CHEST 1 VIEW COMPARISON:  CT chest dated December 12, 2021 FINDINGS: The heart is enlarged with evidence of prior coronary artery bypass grafting. Emphysematous changes of the lungs. Left pleural thickening and scarring is again noted. There also multiple small bilateral opacities, likely scarring/atelectasis. Small right pleural effusion with atelectasis is unchanged. Osteopenia and degenerative changes of the thoracic spine. IMPRESSION: 1.  Stable cardiomegaly. 2. Emphysematous changes with multifocal bilateral atelectasis/scarring. Stable pleural thickening in the left upper lobe as well as in the mid left lung. Stable appearance of the small right pleural effusion with right basilar atelectasis. Electronically Signed   By: Keane Police D.O.   On: 12/14/2021 10:35   DG Chest Port 1 View  Result Date: 12/11/2021 CLINICAL DATA:  Aspiration pneumonia EXAM: PORTABLE CHEST 1 VIEW COMPARISON:  CT chest 12/09/2021 FINDINGS: Bilateral patchy interstitial and alveolar airspace opacities. No pleural effusion or pneumothorax. Stable cardiomegaly. Prior CABG. No acute osseous abnormality. IMPRESSION: 1. Bilateral patchy interstitial and alveolar airspace opacities concerning for multilobar pneumonia versus pulmonary edema. Electronically Signed   By: Kathreen Devoid M.D.   On: 12/11/2021 18:55   DG Chest Port 1 View  Result Date: 12/11/2021 CLINICAL DATA:  Shortness of breath EXAM: PORTABLE CHEST 1 VIEW COMPARISON:  Previous  studies including the examination of 12/09/2021 FINDINGS: Transverse diameter of heart is increased. Central pulmonary vessels are prominent. Breathing motion limits evaluation of lung fields. There are no signs of alveolar pulmonary edema. There is asymmetric pleural density in the lateral aspect of left apex with no significant change. There is some crowding of markings in the lower lung fields. No new focal pulmonary consolidation is seen. There is blunting of lateral CP angles, more so on the left side. There is no pneumothorax. Multiple old fractures are seen in the left ribs. There is evidence of previous coronary bypass surgery. IMPRESSION: Cardiomegaly. There are no signs of alveolar pulmonary edema or new focal pulmonary consolidation. Small bilateral pleural effusions, more so on the left side. Electronically Signed   By: Elmer Picker M.D.   On: 12/11/2021 15:27   DG Chest Port 1 View  Result Date: 12/09/2021 CLINICAL DATA:  Shortness of breath. History of congestive heart failure, hypertension and diabetes. Questionable sepsis-evaluate for abnormality. EXAM: PORTABLE CHEST 1 VIEW COMPARISON:  Radiographs 12/01/2021 and 09/07/2021.  CT 11/07/2020. FINDINGS: 1058 hours. The heart size and mediastinal contours are stable status post median sternotomy and CABG. There is aortic atherosclerosis. There is chronic lung disease with asymmetric pleural thickening and parenchymal scarring on the left. Compared with the most recent radiographs, there are increased interstitial and ground-glass opacities throughout both  lungs which could reflect superimposed mild edema. No confluent airspace opacity, pneumothorax or significant pleural effusion identified. Old rib fractures are present on the left. There is advanced arthropathy at both shoulders. IMPRESSION: Increased interstitial and ground-glass opacities throughout both lungs compared with most recent radiographs, suggesting possible mild edema.  Underlying chronic pleuroparenchymal scarring as described. Electronically Signed   By: Richardean Sale M.D.   On: 12/09/2021 11:11   ECHOCARDIOGRAM COMPLETE  Result Date: 12/12/2021    ECHOCARDIOGRAM REPORT   Patient Name:   Asencion Loveday. Date of Exam: 12/12/2021 Medical Rec #:  [id number]        Height:       67.0 in Accession #:    [id number]       Weight:       181.4 lb Date of Birth:  May 17, 1946       BSA:          1.940 m Patient Age:    57 years         BP:           98/63 mmHg Patient Gender: M                HR:           62 bpm. Exam Location:  [facility] Procedure: 2D Echo Indications:     Bacteremia R78.81  History:         Patient has prior history of Echocardiogram examinations, most                  recent 11/10/2020.  Sonographer:     Kathlen Brunswick RDCS Referring Phys:  [place] Diagnosing Phys: [place]  1. Left ventricular ejection fraction, by estimation, is 60 to 65%. The left ventricle has normal function. The left ventricle has no regional wall motion abnormalities. Left ventricular diastolic parameters are indeterminate. There is the interventricular septum is flattened in systole, consistent with right ventricular pressure overload.  2. Right ventricular systolic function is severely reduced. The right ventricular size is severely enlarged. There is severely elevated pulmonary artery systolic pressure. The estimated right ventricular systolic pressure is 72.5 mmHg.  3. Right atrial size was severely dilated.  4. The mitral valve is degenerative. Mild to moderate mitral valve regurgitation. No evidence of mitral stenosis.  5. Tricuspid valve regurgitation is severe.  6. The aortic valve is normal in structure. Aortic valve regurgitation is not visualized. No aortic stenosis is present.  7. The inferior vena cava is normal in size with greater than 50% respiratory variability, suggesting right atrial pressure of 3 mmHg.  8. Cannot exclude a small PFO. FINDINGS  Left  Ventricle: Left ventricular ejection fraction, by estimation, is 60 to 65%. The left ventricle has normal function. The left ventricle has no regional wall motion abnormalities. The left ventricular internal cavity size was small. There is no left ventricular hypertrophy. The interventricular septum is flattened in systole, consistent with right ventricular pressure overload. Left ventricular diastolic parameters are indeterminate. Right Ventricle: The right ventricular size is severely enlarged. Right vetricular wall thickness was not well visualized. Right ventricular systolic function is severely reduced. There is severely elevated pulmonary artery systolic pressure. The tricuspid regurgitant velocity is 4.50 m/s, and with an assumed right atrial pressure of 10 mmHg, the estimated right ventricular systolic pressure is 36.6 mmHg. Left Atrium: Left atrial size was normal in size. Right Atrium: Right atrial size was severely dilated. Pericardium: There is no  evidence of pericardial effusion. Mitral Valve: The mitral valve is degenerative in appearance. Mild to moderate mitral valve regurgitation. No evidence of mitral valve stenosis. Tricuspid Valve: The tricuspid valve is normal in structure. Tricuspid valve regurgitation is severe. No evidence of tricuspid stenosis. Aortic Valve: The aortic valve is normal in structure. Aortic valve regurgitation is not visualized. No aortic stenosis is present. Aortic valve peak gradient measures 5.5 mmHg. Pulmonic Valve: The pulmonic valve was grossly normal. Pulmonic valve regurgitation is not visualized. No evidence of pulmonic stenosis. Aorta: The aortic root is normal in size and structure. Venous: The inferior vena cava is normal in size with greater than 50% respiratory variability, suggesting right atrial pressure of 3 mmHg. IAS/Shunts: Cannot exclude a small PFO.  LEFT VENTRICLE PLAX 2D LVIDd:         4.90 cm     Diastology LVIDs:         3.50 cm     LV e' medial:     4.24 cm/s LV PW:         1.20 cm     LV E/e' medial:  9.8 LV IVS:        1.20 cm     LV e' lateral:   5.00 cm/s LVOT diam:     2.10 cm     LV E/e' lateral: 8.3 LV SV:         38 LV SV Index:   20 LVOT Area:     3.46 cm  LV Volumes (MOD) LV vol d, MOD A4C: 37.4 ml LV vol s, MOD A4C: 16.5 ml LV SV MOD A4C:     37.4 ml RIGHT VENTRICLE RV Basal diam:  5.80 cm RV S prime:     9.46 cm/s TAPSE (M-mode): 1.9 cm LEFT ATRIUM             Index        RIGHT ATRIUM           Index LA diam:        3.50 cm 1.80 cm/m   RA Area:     18.40 cm LA Vol (A2C):   30.8 ml 15.87 ml/m  RA Volume:   53.10 ml  27.37 ml/m LA Vol (A4C):   18.9 ml 9.74 ml/m LA Biplane Vol: 25.3 ml 13.04 ml/m  AORTIC VALVE                 PULMONIC VALVE AV Area (Vmax): 1.57 cm     PV Vmax:          0.90 m/s AV Vmax:        117.00 cm/s  PV Peak grad:     3.3 mmHg AV Peak Grad:   5.5 mmHg     PR End Diast Vel: 16.00 msec LVOT Vmax:      52.90 cm/s LVOT Vmean:     30.600 cm/s LVOT VTI:       0.110 m  AORTA Ao Root diam: 3.60 cm Ao Asc diam:  3.10 cm MITRAL VALVE               TRICUSPID VALVE MV Area (PHT): 3.34 cm    TV Peak grad:   64.8 mmHg MV Decel Time: 227 msec    TV Vmax:        4.03 m/s MV E velocity: 41.60 cm/s  TR Peak grad:   81.0 mmHg MV A velocity: 63.00 cm/s  TR Vmax:  450.00 cm/s MV E/A ratio:  0.66                            SHUNTS                            Systemic VTI:  0.11 m                            Systemic Diam: 2.10 cm Donnelly Angelica Electronically signed by Donnelly Angelica Signature Date/Time: 12/12/2021/3:04:08 PM    Final    US LIVER DOPPLER  Result Date: 12/14/2021 CLINICAL DATA:  Cirrhosis.  Evaluate hepatic vasculature. EXAM: DUPLEX ULTRASOUND OF LIVER TECHNIQUE: Color and duplex Doppler ultrasound was performed to evaluate the hepatic in-flow and out-flow vessels. COMPARISON:  Chest CT - 12/12/2021 FINDINGS: There is diffuse increased slightly coarsened echogenicity of the hepatic parenchyma with nodularity hepatic contour  compatible with clinical suspicion of cirrhosis. No discrete hepatic lesions. No intrahepatic biliary ductal dilatation. Portal Vein Velocities (normal hepatopetal directional flow) Main:  17 cm/sec Right:  7 cm/sec Left:  11 cm/sec Hepatic Vein Velocities (normal hepatofugal directional flow) Right:  29 cm/sec Middle:  20 cm/sec Left:  9 cm/sec Hepatic Artery Velocity:  162 cm/sec Splenic Vein Velocity:  11 cm/sec Spleen: Normal in size measuring 12.0 x 12.0 x 3.0 cm with calculated volume of 245 cc. Varices: None visualized Ascites: Small to moderate amount of intra-abdominal ascites. IMPRESSION: 1. Findings suggestive hepatic cirrhosis. No discrete hepatic lesions though further evaluation could be performed with nonemergent either cirrhotic protocol CT or (preferably) abdominal MRI as indicated. 2. Patent hepatic vasculature with normal directional flow. 3. Small to moderate amount of intra-abdominal ascites without evidence of splenomegaly. Electronically Signed   By: Sandi Mariscal M.D.   On: 12/14/2021 10:26   [place] EKG SITE RITE  Result Date: 12/15/2021 If [facility] image not attached, placement could not be confirmed due to current cardiac rhythm.  DG ESOPHAGUS W DOUBLE CM (HD)  Result Date: 12/11/2021 CLINICAL DATA:  A 76 year old male presents for evaluation of swallowing and dysphagia. EXAM: ESOPHOGRAM/BARIUM SWALLOW TECHNIQUE: Single contrast examination was performed using  thin barium. FLUOROSCOPY: Fluoroscopy time: 2 minutes 48 seconds Fluoroscopy dose: 50.7 mGy COMPARISON:  Chest CT from December 09, 2021. FINDINGS: The exam was markedly limited due to the patient's debilitated state and difficulty in positioning the patient. Exam was performed in slightly LPO just off AP and RPO positioning. The esophagus was found to be markedly patulous above the GE junction where there was tortuosity and extensive tertiary contractions throughout the exam. Long segment narrowing of the GE junction was noted on  multiple images though this could not be challenged with a large bolus and the presence of dysmotility also limited assessment of this area. This appears to be smooth and long segment. Along the distal medial esophagus there are small pulsion diverticula with distortion of the medial esophageal wall. Contrast did pass into the stomach aided by the administration of some water. Additional images could not be obtained due to coughing that the patient experienced though there was no visible "gross" aspiration.There may be some laryngeal penetration though this area was difficult to evaluate. In the RPO position marked "corkscrewing" and tortuosity of the proximal esophagus was noted with the patient in kyphotic position at baseline. IMPRESSION: 1. Markedly patulous esophagus above the GE junction  with extensive tertiary contractions throughout the exam. "Corkscrewing" of the esophagus as well was noted with limited peristaltic activity distally though there was emptying of the esophagus despite segmental narrowing of the distal esophagus into the stomach. Findings could relate to distal esophageal stricture but could not be well assessed due to the debilitated nature of the patient. Marked dysmotility and the long segment narrowing with beaked appearance raising the question achalasia. Esophageal manometry and endoscopy may be helpful to narrow the differential. 2. Given difficulty with swallowing a dedicated swallow function may also be helpful. Electronically Signed   By: Zetta Bills M.D.   On: 12/11/2021 16:04      ASSESSMENT/PLAN   Acute on chronic hypoxemic respiratory failure    -patient initially with leukocytosis and sepsis presumably due to cellulitis     -patient has multifactorial causes including severe emphysema, bilateral effusions, chronic lung disease with rounded atelectasis and pleural calcification, pulmonary infiltrates suggestive of ongoing pneumonia, CHF exacerbation , COPD  exacerbation, pulmonary hypertension and extreme kyphophosis with restrictive physiology not to mention physical deconditioning and unmentioned comorbid conditions.     - He is currently with renal failure which appears to be acute on chronic and is likely to hinder diuresis necessary for effusions.    -he has staph aureus bacteremia -will hold propranolol to potentiate BP and allow better diuresis for now.   Acute on chronic renal failure KDIGO 3  Dc nonessential nephrotonix    - patient with UTI but only 1K colonies      - dc non essential nephrotoxins - will hold vitamin C, protonix, vitamin D, calcium until acutely ill status is improved.      Bilateral pleural effusions  Diagnositic thoracentesis and therapeutic thoracentesis -aldactone 50 mg and lasix IV   Sepsis with staph bacteremia  Present on admission    - on ancef and flagyl    - ID team on case appreciate input.    Moderate to severe Pulmonary hypertension     -patient may have had PE although his symptoms may be explained by all of the above medical problems   - we will order VQ scan for now to rule out any large defects   - I suspect PH is group 2 due to heart failure with valvular heart disease which mainly focuses on optimization of CHF status    Acute on chronic diastolic CHF  BNP >1747 -TTE reviewed -agree with diuresis for now -monitor telemetry     Extreme kyphosis and physical deconditioning    Patient is at high risk of chronic lung disease due to kyphosis and sedentary lifestyle , he is however DNR and is with advanced age.  There is consultation placed already for palliative care which seems reasonable.      Thank you for allowing me to participate in the care of this patient.    Patient/Family are satisfied with care plan and all questions have been answered.  This document was prepared using Dragon voice recognition software and may include unintentional dictation errors.     Ottie Glazier, M.D.  Division of [place]

## 2021-12-17 NOTE — Progress Notes (Signed)
Date of Admission:  12/09/2021      ID: Luis Brood. is a 76 y.o. male Principal Problem:   Sepsis ([place]) Active Problems:   Spinal stenosis   Type 2 diabetes mellitus without complication, without long-term current use of insulin (HCC)   CAD (coronary artery disease) of artery bypass graft   Depression with anxiety   Atrial fibrillation with RVR (HCC)   Cellulitis   Hypokalemia   Malnutrition of moderate degree   Aspiration into airway   Cirrhosis (HCC)   Dysphagia   Other ascites    Subjective: Patient is feeling better States the skin lesions are healing itching much better   Medications:   (feeding supplement) PROSource Plus  30 mL Oral TID BM   aspirin  81 mg Oral Daily   Chlorhexidine Gluconate Cloth  6 each Topical Daily   feeding supplement  1 Container Oral TID BM   heparin  5,000 Units Subcutaneous Q8H   insulin aspart  0-15 Units Subcutaneous TID WC   lidocaine  1 patch Transdermal Q24H   loratadine  10 mg Oral Daily   midodrine  2.5 mg Oral TID WC   multivitamin with minerals  1 tablet Oral Daily   PARoxetine  10 mg Oral QHS   pravastatin  40 mg Oral q1800   sodium chloride flush  10-40 mL Intracatheter Q12H    Objective: Vital signs in last 24 hours: Temp:  [97.5 F (36.4 C)-98.1 F (36.7 C)] 97.5 F (36.4 C) (02/09 1117) Pulse Rate:  [56-72] 59 (02/09 1117) Resp:  [15-18] 18 (02/09 1117) BP: (94-108)/(54-63) 103/63 (02/09 1117) SpO2:  [95 %-100 %] 95 % (02/09 1117) Weight:  [87.8 kg] 87.8 kg (02/09 0500)  PHYSICAL EXAM:  General: Alert, cooperative, no distress, appears stated age.  Scalp superficial ulcers      lungs: b/l air entry Heart: regular Abdomen: Soft, non-tender,not distended. Bowel sounds normal. No masses Extremities:multiple excoriations legs much improved     Lymph: Cervical, supraclavicular normal. Neurologic: Grossly non-focal  Lab Results Recent Labs    12/15/21 0537 12/16/21 0530 12/17/21 0555  WBC 6.9   --   --   HGB 10.9*  --   --   HCT 32.2*  --   --   NA 134* 138 134*  K 3.8 3.0* 3.9  CL 101 111 102  CO2 25 21* 25  BUN 51* 40* 45*  CREATININE 2.27* 1.81* 1.95*   Liver Panel Recent Labs    12/15/21 0537  ALBUMIN 2.0*   Microbiology: 12/09/2021 blood culture 1 bottle out of 4 positive for Staph aureus Wound culture from 2-23 Staph aureus Blood culture from 12/11/2021 no growth 12/15/2021 body fluid culture sent  Peritoneal fluid analysis: 73 WBCs 17% lymphocytes and 12% neutrophils Total protein less than 3 Studies/Results: [place] EKG SITE RITE  Result Date: 12/15/2021 If Site Rite image not attached, placement could not be confirmed due to current cardiac rhythm.    Assessment/Plan:  Staph aureus bacteremia.  Due to skin.  Wounds.  He has ulcerating lesions on his scalp and his legs.  Culture taken from the scalp wound is Staph aureus.  Repeat blood culture no growth.  The wounds are looking much better 2D echo shows a pulmonary artery pressure of 19 mm Because of low bioburden 1 out of 4 bottles being positive, clearly identified source of infection which is  skin do not suspect endocarditis.  Hence TEE which is a high risk procedure in this  case due to pulmonary artery pressure hypertension is deferred.  Patient has lumbar stenosis and pain in his back and legs.  MRI of the spine did not show any infection.  Patient is   We will give cefazolin for 3 weeks.  End date would be 12/31/2021.  Anemia  AKI on CKD Ultrasound  did not show any hydronephrosis or bladder distention.  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  This is is a transudate no evidence of SBP.  Pulmonary artery hypertension  CAD status post CABG Atrial flutter  Hyperlipidemia on pravastatin  Multiple myeloma  Diabetes mellitus on sliding scale  History of right total hip arthroplasty Discussed the management with the patient and care team

## 2021-12-17 NOTE — Progress Notes (Signed)
Physical Therapy Treatment Patient Details Name: Luis [name]. MRN: [id number] DOB: 1945/11/29 Today's Date: 12/17/2021   History of Present Illness The pt is a 76 y/o male who presented to ER for worsening SOB and weakness. Per chart EMS found pt room air pulse ox to be 90-91% and BP in 80s. Pt MRSA PCR was positive. Pt chest XRAY showed increased additional groundglass opacities throughout both lungs suggestive of possible mild edema. Underlying chronic pleural-parenchymal scarring. Pt with bilateral lower extremity cellulitis and pt also found to have atrial flutter with 2-1 block, PVCs and right bundle branch block. PMH significant for CAD status post CABG, multiple myeloma, diabetes mellitus with stage III chronic kidney disease, HTN, anxiety and depression. Pt [facility] a [facility] at [facility].    PT Comments    Ready for session.  Mod a x 1 to EOB.  He is able to stand from elevated bed with min a x 1 and progress gait 80' with SPC and min guard/min a x 1 for one LOB.  He stated he has used RW in the past in rehab but stated it "gets away from me" and that he prefers [facility] as he has used it for years.  Is not interested in trying RW but he may be better suited for it at this time given balance and endurance.  He does elect to remain up in recliner after session with needs met and safety precautions in place.   Recommendations for follow up therapy are one component of a multi-disciplinary discharge planning process, led by the attending physician.  Recommendations may be updated based on patient status, additional functional criteria and insurance authorization.  Follow Up Recommendations  Skilled nursing-short term rehab (<3 hours/day)     Assistance Recommended at Discharge Intermittent Supervision/Assistance  Patient can return home with the following A little help with walking and/or transfers;A little help with bathing/dressing/bathroom;Help with stairs or ramp for entrance;Assist for  transportation   Equipment Recommendations  Rolling walker (2 wheels)    Recommendations for Other Services       Precautions / Restrictions Precautions Precautions: Fall Restrictions Weight Bearing Restrictions: No     Mobility  Bed Mobility Overal bed mobility: Needs Assistance Bed Mobility: Supine to Sit     Supine to sit: Mod assist, HOB elevated          Transfers Overall transfer level: Needs assistance Equipment used: Straight cane Transfers: Sit to/from Stand Sit to Stand: Min assist           General transfer comment: increased time and bed height    Ambulation/Gait Ambulation/Gait assistance: Min guard, Min assist Gait Distance (Feet): 80 Feet Assistive device: Straight cane Gait Pattern/deviations: Decreased step length - left, Decreased step length - right, Decreased dorsiflexion - right, Decreased dorsiflexion - left, Step-to pattern, Trunk flexed Gait velocity: decreased         Stairs             Wheelchair Mobility    Modified Rankin (Stroke Patients Only)       Balance Overall balance assessment: Needs assistance Sitting-balance support: No upper extremity supported, Feet supported Sitting balance-Leahy Scale: Good     Standing balance support: Single extremity supported, During functional activity Standing balance-Leahy Scale: Fair Standing balance comment: one lob with gait recovered with min a x 1  Cognition Arousal/Alertness: Awake/alert Behavior During Therapy: WFL for tasks assessed/performed Overall Cognitive Status: Within Functional Limits for tasks assessed                                          Exercises      General Comments        Pertinent Vitals/Pain Pain Assessment Pain Assessment: No/denies pain    Home Living                          Prior Function            PT Goals (current goals can now be found in the care plan  section) Progress towards PT goals: Progressing toward goals    Frequency    Min 2X/week      PT Plan Current plan remains appropriate    Co-evaluation              AM-PAC PT "6 Clicks" Mobility   Outcome Measure  Help needed turning from your back to your side while in a flat bed without using bedrails?: A Little Help needed moving from lying on your back to sitting on the side of a flat bed without using bedrails?: A Lot Help needed moving to and from a bed to a chair (including a wheelchair)?: A Lot Help needed standing up from a chair using your arms (e.g., wheelchair or bedside chair)?: A Lot Help needed to walk in hospital room?: A Little Help needed climbing 3-5 steps with a railing? : A Lot 6 Click Score: 14    End of Session Equipment Utilized During Treatment: Oxygen;Gait belt Activity Tolerance: Patient tolerated treatment well;Patient limited by fatigue Patient left: in chair;with call bell/phone within reach;with chair alarm set Nurse Communication: Mobility status PT Visit Diagnosis: Other abnormalities of gait and mobility (R26.89);Muscle weakness (generalized) (M62.81);History of falling (Z91.81);Unsteadiness on feet (R26.81);Pain     Time: 0932-6712 PT Time Calculation (min) (ACUTE ONLY): 24 min  Charges:  $Gait Training: 8-22 mins $Therapeutic Activity: 8-22 mins                    Chesley Noon, PTA 12/17/21, 11:50 AM

## 2021-12-17 NOTE — Progress Notes (Signed)
Palliative: Chart review completed.  At this point it is anticipated that Mr. Sones will be discharging to [facility] for short-term rehab as he needs several weeks of IV antibiotics to treat bacteremia.  Anticipate returning to independent living facility, [facility].  He will be discharging with outpatient palliative services.  Patient and family are interested in transitioning to "treat the treatable" hospice services when appropriate.  Transition of care team is working closely with patient and family for disposition.  Plan: At this point continue to treat the treatable but no CPR or intubation.  Anticipate DC to [facility] 2/10 for short-term rehab with palliative care services.  Considering transition to hospice care when appropriate.  No charge Quinn Axe, NP Palliative medicine team Team phone [phone] Greater than 50% of this time was spent counseling and coordinating care related to the above assessment and plan.

## 2021-12-18 DIAGNOSIS — N179 Acute kidney failure, unspecified: Secondary | ICD-10-CM

## 2021-12-18 LAB — BASIC METABOLIC PANEL
Anion gap: 7 (ref 5–15)
BUN: 45 mg/dL — ABNORMAL HIGH (ref 8–23)
CO2: 25 mmol/L (ref 22–32)
Calcium: 8.3 mg/dL — ABNORMAL LOW (ref 8.9–10.3)
Chloride: 102 mmol/L (ref 98–111)
Creatinine, Ser: 1.73 mg/dL — ABNORMAL HIGH (ref 0.61–1.24)
GFR, Estimated: 40 mL/min — ABNORMAL LOW (ref >60)
Glucose, Bld: 101 mg/dL — ABNORMAL HIGH (ref 70–99)
Potassium: 4.1 mmol/L (ref 3.5–5.1)
Sodium: 134 mmol/L — ABNORMAL LOW (ref 135–145)

## 2021-12-18 LAB — GLUCOSE, CAPILLARY
Glucose-Capillary: 100 mg/dL — ABNORMAL HIGH (ref 70–99)
Glucose-Capillary: 112 mg/dL — ABNORMAL HIGH (ref 70–99)

## 2021-12-18 LAB — BODY FLUID CULTURE W GRAM STAIN: Culture: NO GROWTH

## 2021-12-18 MED ORDER — ACETAMINOPHEN 325 MG PO TABS: 650.0000 mg | ORAL_TABLET | Freq: Four times a day (QID) | ORAL | Status: AC | PRN

## 2021-12-18 MED ORDER — CEFAZOLIN IV (FOR PTA / DISCHARGE USE ONLY): 2.0000 g | Freq: Three times a day (TID) | INTRAVENOUS | 0 refills | Status: AC

## 2021-12-18 MED ORDER — LORATADINE 10 MG PO TABS: 10.0000 mg | ORAL_TABLET | Freq: Every day | ORAL | Status: AC

## 2021-12-18 MED ORDER — ENSURE ENLIVE PO LIQD: 237.0000 mL | Freq: Two times a day (BID) | ORAL | 12 refills | Status: AC

## 2021-12-18 MED ORDER — MIDODRINE HCL 2.5 MG PO TABS: 2.5000 mg | ORAL_TABLET | Freq: Three times a day (TID) | ORAL | Status: DC

## 2021-12-18 MED ORDER — ENSURE ENLIVE PO LIQD: 237.0000 mL | Freq: Two times a day (BID) | ORAL | Status: DC

## 2021-12-18 MED ORDER — ADULT MULTIVITAMIN W/MINERALS CH: 1.0000 | ORAL_TABLET | Freq: Every day | ORAL | Status: AC

## 2021-12-18 MED ORDER — PROSOURCE PLUS PO LIQD: 30.0000 mL | Freq: Three times a day (TID) | ORAL | Status: AC

## 2021-12-18 NOTE — Discharge Summary (Signed)
Physician Discharge Summary   Patient: Luis [name]. MRN: [id number] DOB: Nov 18, 1945  Admit date:     12/09/2021  Discharge date: 12/18/21  Discharge Physician: Luis [name]   PCP: Luis Harrier, MD   Recommendations at discharge:  Please check BMP within a week. Home diuretics and antihypertensives are being held-please watch carefully for any fluid overload and increasing blood pressure-his PCP can restart diuretics and antihypertensives as needed. Follow-up with infectious disease, his cardiologist and oncologist. Patient needs dysphagia 3 diet with minced meat and extra gravy, no straws. Patient also need outpatient GI evaluation for dysphagia.  Discharge Diagnoses: Principal Problem:   Sepsis ([place]) Active Problems:   Spinal stenosis   Type 2 diabetes mellitus without complication, without long-term current use of insulin (HCC)   CAD (coronary artery disease) of artery bypass graft   Depression with anxiety   Atrial fibrillation with RVR (HCC)   Cellulitis   Hypokalemia   Malnutrition of moderate degree   Aspiration into airway   Cirrhosis (HCC)   Dysphagia   Ascites   Pleural effusion   AKI (acute kidney injury) [facility])   Hospital Course: Luis Novelo. is a 76 y.o. male with medical history significant for coronary artery disease status post CABG, history of multiple myeloma, diabetes mellitus with stage III chronic kidney disease, hypertension, anxiety and depression who presents to the ER for evaluation of worsening shortness of breath from his baseline and weakness. Initially met sepsis criteria with tachycardia, leukocytosis and lactic acidosis.  Found to have lower extremity cellulitis and chronic wounds, 1 out of 4 blood cultures with MSSA secondary to wounds. Repeat blood cultures negative.  TTE was negative for any vegetations, not a candidate for TEE due to severe pulmonary hypertension.  MRI of lumbar spine with no sign of infection. Infectious disease is  on board and they are recommending continuation of cefazolin till 12/31/2021. PICC line was placed. Plan is to discharge to short-term rehab to complete IV antibiotics.  Patient is planning to go back to his independent living facility with outpatient palliative care.  He wants to treat the treatable.  Assessment and Plan:  Sepsis/lower extremity cellulitis/bacteremia.  Initially met sepsis criteria with leukocytosis, tachycardia and lactic acidosis.  Initial 1/4 blood cultures positive for MRSA secondary to lower extremity wounds.  MRI of lumbar spine with no sign of infection.  TTE was negative, not a candidate for TEE due to severe pulmonary hypertension.  PICC line was placed. ID is recommending cefazolin till 12/31/21. PT is recommending rehab, patient lives alone and would like to go back to his independent living facility with home hospice after completing the course of antibiotics at rehab.  He would like to treat the treatable. -Continue cefazolin till 12/31/2021.  PICC line in place   Acute hypoxic respiratory failure.  Likely multifactorial.  VQ scan on 12/14/2021 was negative for PE.  Has small pleural effusions which are not amenable for tap.  Some concern of aspiration pneumonia, so Flagyl was added to cefazolin.  Currently saturating 100% on 2 L of oxygen.  Respiration improved after getting paracentesis -Discontinue Flagyl as is completed the course for concern of aspiration pneumonia. -Try weaning from oxygen   Pulmonary hypertension.  Found to have severe pulmonary hypertension on TTE.  Received a dose of albumin with Lasix.  Pulmonology was consulted and they signed off. Cardiology was also consulted and they would like to evaluate as outpatient.   Cirrhosis.  S/p paracentesis with improvement in  respiratory status.  Removal of 2.7 L of transudative fluid.  No sign of SBP. -Outpatient follow-up   Dysphagia.  Esophagogram on 12/11/2021 shows corkscrewing, possible stricture and  marked dysmotility.  Swallow evaluation with concern of aspiration risk. -Follow swallow team recommendations for diet. -Outpatient GI follow-up on discharge   Hypokalemia.  Resolved -Replete potassium and magnesium as needed and monitor   Hypocalcemia.  Corrected calcium improved to above 9. -Work on nutrition to correct hypoalbuminemia. -Start him on oral supplement   Chronic diarrhea.  No recent change.  GI pathogen panel was negative. -Continue with outpatient GI follow-up   HFpEF/CAD.  S/p bypass.  Appears well compensated.  TTE with mild/moderate mitral regurgitation, severely reduced right ventricular function. Diuretics are being held due to softer blood pressure. -Continue to monitor   Hypertension.  Blood pressure within lower normal limit. -Continue holding home losartan and propranolol.   Multiple myeloma. -Outpatient follow-up with oncology.  Currently not being treated.  T7 compression fracture.  Secondary to multiple myeloma -Continue with pain control    Consultants: Infectious disease Cardiology Procedures performed: PICC line insertion Disposition: Skilled nursing facility Diet recommendation:  Dysphagia type 3 , Liquid  DISCHARGE MEDICATION: Allergies as of 12/18/2021       Reactions   Pravastatin Sodium Other (See Comments)   Statins Other (See Comments)   Muscle aches   Zocor [simvastatin] Other (See Comments)   Muscle aches        Medication List     STOP taking these medications    ergocalciferol 1.25 MG (50000 UT) capsule Commonly known as: VITAMIN D2   furosemide 40 MG tablet Commonly known as: LASIX   losartan 25 MG tablet Commonly known as: COZAAR   metolazone 2.5 MG tablet Commonly known as: ZAROXOLYN   potassium chloride 10 MEQ tablet Commonly known as: KLOR-CON   propranolol 10 MG tablet Commonly known as: INDERAL   VITAMIN B-12 IJ       TAKE these medications    (feeding supplement) PROSource Plus liquid Take  30 mLs by mouth 3 (three) times daily between meals.   feeding supplement Liqd Take 237 mLs by mouth 2 (two) times daily between meals.   acetaminophen 325 MG tablet Commonly known as: TYLENOL Take 2 tablets (650 mg total) by mouth every 6 (six) hours as needed for mild pain (or Fever >/= 101).   ALPRAZolam 0.25 MG tablet Commonly known as: XANAX Take 0.25 mg by mouth at bedtime as needed. for sleep   aspirin 81 MG tablet Take 81 mg by mouth daily.   Bee Pollen 500 MG Chew Chew 1 tablet by mouth 2 (two) times daily.   Calcium Carbonate-Vitamin D 600-400 MG-UNIT chew tablet Chew 1 tablet by mouth 2 (two) times daily.   ceFAZolin  IVPB Commonly known as: ANCEF Inject 2 g into the vein every 8 (eight) hours for 15 days. Indication:  MSSA bacteremia with MSSA skin infection Last Day of Therapy:  12/31/2021 Labs - Once weekly:  CBC/D and CMP Please pull PIC at completion of IV antibiotics Fax weekly labs to [phone]   glucose blood test strip USE ONCE DAILY. USE AS INSTRUCTED. DX E11.9   ipratropium-albuterol 0.5-2.5 (3) MG/3ML Soln Commonly known as: DUONEB Take 3 mLs by nebulization every 6 (six) hours as needed.   lidocaine 5 % Commonly known as: Lidoderm Place 1 patch onto the skin every 12 (twelve) hours. Remove & Discard patch within 12 hours or as directed by MD  loratadine 10 MG tablet Commonly known as: CLARITIN Take 1 tablet (10 mg total) by mouth daily.   lovastatin 40 MG tablet Commonly known as: MEVACOR Take 40 mg by mouth at bedtime.   metFORMIN 500 MG tablet Commonly known as: GLUCOPHAGE Take 500 mg by mouth daily with breakfast.   midodrine 2.5 MG tablet Commonly known as: PROAMATINE Take 1 tablet (2.5 mg total) by mouth 3 (three) times daily with meals.   multivitamin with minerals Tabs tablet Take 1 tablet by mouth daily.   omeprazole 20 MG capsule Commonly known as: PRILOSEC Take 20 mg by mouth daily.   PARoxetine 10 MG  tablet Commonly known as: PAXIL Take 10 mg by mouth daily.   Ventolin HFA 108 (90 Base) MCG/ACT inhaler Generic drug: albuterol Inhale 2 puffs into the lungs every 6 (six) hours as needed for wheezing.               Home Infusion Instuctions  (From admission, onward)           Start     Ordered   12/18/21 0000  Home infusion instructions       Question:  Instructions  Answer:  Flushing of vascular access device: 0.9% NaCl pre/post medication administration and prn patency; Heparin 100 u/ml, 38m for implanted ports and Heparin 10u/ml, 510mfor all other central venous catheters.   12/18/21 1210            Follow-up Information     HaTracie HarrierMD. Schedule an appointment as soon as possible for a visit in 1 week(s).   Specialty: Internal Medicine Contact information: [phone]               Discharge Exam: FiDanley Dankereights   12/15/21 1951 12/17/21 0500 12/18/21 0500  Weight: 88 kg 87.8 kg 86.4 kg   General.  Frail elderly man, in no acute distress. Pulmonary.  Lungs clear bilaterally, normal respiratory effort. CV.  Regular rate and rhythm, no JVD, rub or murmur. Abdomen.  Soft, nontender, nondistended, BS positive. CNS.  Alert and oriented .  No focal neurologic deficit. Extremities.  Trace LE edema, no cyanosis, pulses intact and symmetrical.  Multiple skin lesions with lacerations and abrasions. Psychiatry.  Judgment and insight appears normal.   Condition at discharge: stable  The results of significant diagnostics from this hospitalization (including imaging, microbiology, ancillary and laboratory) are listed below for reference.   Imaging Studies: DG Chest 2 View  Result Date: 12/01/2021 CLINICAL DATA:  Fall, tachycardia EXAM: CHEST - 2 VIEW COMPARISON:  09/07/2021 FINDINGS: Interstitial coarsening secondary to parenchymal scarring better appreciated on CT examination of 11/07/2020 within  the left mid and lower lung zone is unchanged. Resultant mild left-sided volume loss again noted. No superimposed confluent pulmonary infiltrate. No pneumothorax or pleural effusion. Coronary artery bypass grafting has been performed. Cardiac size is within normal limits. Pulmonary vascularity is normal. Osseous structures are age-appropriate. No acute bone abnormality. IMPRESSION: No active cardiopulmonary disease. Electronically Signed   By: AsFidela Salisbury.D.   On: 12/01/2021 20:21   CT Head Wo Contrast  Result Date: 12/01/2021 CLINICAL DATA:  Trip and fall striking head. EXAM: CT HEAD WITHOUT CONTRAST TECHNIQUE: Contiguous axial images were obtained from the base of the skull through the vertex without intravenous contrast. RADIATION DOSE REDUCTION: This exam was performed according to the departmental dose-optimization program which includes automated exposure control, adjustment of the mA and/or kV according  to patient size and/or use of iterative reconstruction technique. COMPARISON:  Provided head CT 12/21/2008 FINDINGS: Brain: Generalized cerebral atrophy. No intracranial hemorrhage, mass effect, or midline shift. No hydrocephalus. The basilar cisterns are patent. Mild periventricular and deep chronic small vessel ischemic change. No evidence of territorial infarct or acute ischemia. No extra-axial or intracranial fluid collection. Vascular: Atherosclerosis of skullbase vasculature without hyperdense vessel or abnormal calcification. Skull: No fracture or focal lesion. Sinuses/Orbits: No acute findings. Other: Right frontal scalp hematoma. IMPRESSION: 1. Right frontal scalp hematoma. No acute intracranial abnormality. No skull fracture. 2. Generalized atrophy and chronic small vessel ischemic change. Electronically Signed   By: Keith Rake M.D.   On: 12/01/2021 18:39   CT CHEST WO CONTRAST  Result Date: 12/12/2021 CLINICAL DATA:  Aspiration hypoxia. Fluid overload. Infection. Shortness of  breath. EXAM: CT CHEST WITHOUT CONTRAST TECHNIQUE: Multidetector CT imaging of the chest was performed following the standard protocol without IV contrast. RADIATION DOSE REDUCTION: This exam was performed according to the departmental dose-optimization program which includes automated exposure control, adjustment of the mA and/or kV according to patient size and/or use of iterative reconstruction technique. COMPARISON:  Chest radiograph 12/11/2021. Esophagram 12/11/2021. CT 12/09/2021 FINDINGS: Cardiovascular: Cardiac enlargement. No pericardial effusions. Postoperative changes in the mediastinum consistent with coronary bypass. Calcification of the aorta. No aneurysm. Mediastinum/Nodes: Thyroid gland is unremarkable. Esophagus is decompressed. Scattered lymph nodes throughout the mediastinum without pathologic enlargement, likely reactive. Lungs/Pleura: Small bilateral pleural effusions. Pleural calcification on the left may represent post inflammatory process. Diffuse emphysematous changes in the lungs. Atelectasis or consolidation in both lung bases with patchy areas of airspace infiltration seen in both lungs, most prominently in the right upper lung. Pulmonary infiltration is progressing since previous study, possibly progressing pneumonia or interval aspiration. Upper Abdomen: Upper abdominal ascites. Liver morphology consistent with hepatic cirrhosis. Mild gynecomastia. Musculoskeletal: Sternotomy wires. Degenerative changes in the spine and shoulders. Prominent thoracic kyphosis and mild scoliosis convex towards the right. Multiple lucent lesions, some with expansile change, are demonstrated in several ribs and in the spine. This is consistent with history of multiple myeloma. Several vertebral compression deformities are present. There is an acute or subacute appearing fracture of the body of a midthoracic vertebra, likely T7. This likely represents a pathologic fracture. No change since the previous study  from several days ago. IMPRESSION: 1. Bilateral pleural effusions with progressing infiltrates, particularly on the right. This could indicate progression of pneumonia or interval aspiration. 2. Stable appearance of multiple bone lesions and vertebral fractures of differing ages, including an acute appearing fracture probably at T7. No change since previous study. 3. Changes of hepatic cirrhosis with upper abdominal ascites. 4. Aortic atherosclerosis. 5. Emphysematous changes. Electronically Signed   By: Lucienne Capers M.D.   On: 12/12/2021 17:58   CT CHEST WO CONTRAST  Result Date: 12/09/2021 CLINICAL DATA:  Shortness of breath. Generalized weakness. Recent fall. Sepsis protocol initiated by EMS. EXAM: CT CHEST WITHOUT CONTRAST TECHNIQUE: Multidetector CT imaging of the chest was performed following the standard protocol without IV contrast. RADIATION DOSE REDUCTION: This exam was performed according to the departmental dose-optimization program which includes automated exposure control, adjustment of the mA and/or kV according to patient size and/or use of iterative reconstruction technique. COMPARISON:  Portable chest obtained earlier today. Chest CTA dated 11/07/2020. FINDINGS: Cardiovascular: Atheromatous calcifications, including the coronary arteries and aorta. Mildly enlarged heart, unchanged. Mediastinum/Nodes: No enlarged mediastinal or axillary lymph nodes. Thyroid gland, trachea, and esophagus demonstrate no significant findings.  Lungs/Pleura: Bilateral dependent atelectasis. Stable parenchymal scarring in the posterolateral lingula. Minimal right upper lobe dependent atelectasis with improvement. No significant change in bilateral bullous changes in a centrilobular distribution. Interval minimal right pleural effusion. Small left pleural effusion without significant change. Stable pleural calcifications on the left. Upper Abdomen: Interval small amount of ascites. Contracted gallbladder with no  significant change in multiple small calculi measuring up to 6 mm in maximum diameter each. No gallbladder wall thickening or pericholecystic fluid. Ingested tablet fragments in the stomach and duodenum. Musculoskeletal: Previously noted multiple lytic lesions in the bony skeleton compatible with previously reported multiple myeloma. Stable T10 fracture deformity without acute fracture lines. An oblique fracture is again demonstrated in the T7 vertebral body extending through the pedicle on the right without additional posterior extension. IMPRESSION: 1. No interval findings suspicious for pneumonia. 2. Bilateral dependent atelectasis and lingular scarring. 3. Interval minimal right pleural effusion and stable small left pleural effusion. 4. Stable bony changes of multiple myeloma with an acute pathological fracture of the T7 vertebral body and old compression fracture of the T10 vertebral body. 5. Stable changes of COPD with centrilobular emphysema. 6. Stable dense calcific coronary artery and aortic atherosclerosis. 7. Cholelithiasis without evidence of cholecystitis. 8. Stable probable posttraumatic or postinflammatory left pleural calcifications. Aortic Atherosclerosis (ICD10-I70.0) and Emphysema (ICD10-J43.9). Electronically Signed   By: Claudie Revering M.D.   On: 12/09/2021 16:17   CT Cervical Spine Wo Contrast  Result Date: 12/01/2021 CLINICAL DATA:  Trip and fall.  Neck trauma. EXAM: CT CERVICAL SPINE WITHOUT CONTRAST TECHNIQUE: Multidetector CT imaging of the cervical spine was performed without intravenous contrast. Multiplanar CT image reconstructions were also generated. RADIATION DOSE REDUCTION: This exam was performed according to the departmental dose-optimization program which includes automated exposure control, adjustment of the mA and/or kV according to patient size and/or use of iterative reconstruction technique. COMPARISON:  CT cervical spine 11/07/2020 FINDINGS: Alignment: Straightening of  normal lordosis. No traumatic subluxation. Skull base and vertebrae: No acute fracture. Intact dens and skull base. C1-C2 degenerative change. Bridging anterior as well as posterior osteophytes with bony ankylosis. No osteophyte fracture. The bones are under mineralized with heterogeneous marrow, no bony destructive process. Soft tissues and spinal canal: No prevertebral fluid or swelling. No visible canal hematoma. Disc levels: Diffuse disc space narrowing with ossification of scattered disc spaces, unchanged. Flowing osteophytes anterior and posteriorly with bony ankylosis. Multilevel neural foraminal narrowing without high-grade canal stenosis. Upper chest: Chronic left pleuroparenchymal thickening. No acute findings. Other: Advanced carotid calcifications. IMPRESSION: 1. No acute fracture or traumatic subluxation of the cervical spine. 2. Unchanged appearance of diffuse degenerative disc disease and flowing anterior and posterior osteophytes, typical of ankylosis. Electronically Signed   By: Keith Rake M.D.   On: 12/01/2021 18:45   MR LUMBAR SPINE WO CONTRAST  Result Date: 12/12/2021 CLINICAL DATA:  History of epidural steroid injections in lumbar spine, bacteremia, concern for osteomyelitis. EXAM: MRI LUMBAR SPINE WITHOUT CONTRAST TECHNIQUE: Multiplanar, multisequence MR imaging of the lumbar spine was performed. No intravenous contrast was administered. COMPARISON:  04/06/2019 FINDINGS: Segmentation:  Standard. Alignment:  Levocurvature of the lumbar spine. Vertebrae: No acute fracture or suspicious osseous lesion. No evidence of discitis or osteomyelitis. Unchanged degenerative changes at the anterior superior endplate of L5. Vertebral body endplates are otherwise intact.Redemonstrated cystic lesion in the left iliac bone, which appears unchanged compared to 2020. Conus medullaris and cauda equina: Conus extends to the T12 level. Conus and cauda equina appear normal. No epidural  collection.  Paraspinal and other soft tissues: Negative. Disc levels: L1-L2: No significant disc bulge. No spinal canal stenosis or neural foraminal narrowing. L2-L3: Mild disc bulge. Mild facet arthropathy. Narrowing of the lateral recesses. No spinal canal stenosis or neural foraminal narrowing. L3-L4: Mild disc bulge. Moderate right and mild left facet arthropathy. Narrowing of the lateral recesses. No spinal canal stenosis or neural foraminal narrowing. L4-L5: Mild disc bulge. Severe facet arthropathy. Effacement of the lateral recesses. Moderate spinal canal stenosis, which appears similar to the prior exam. Unchanged moderate bilateral neural foraminal narrowing. L5-S1: No significant disc bulge. Moderate right and mild left facet arthropathy. No spinal canal stenosis or neural foraminal narrowing. IMPRESSION: 1. No acute osseous abnormality. No evidence of discitis osteomyelitis. 2. L4-L5 moderate spinal canal stenosis and moderate bilateral neural foraminal narrowing, unchanged. Effacement of the lateral recesses at this level likely compresses the descending L5 nerve roots. 3. Narrowing of the lateral recesses at L2-L3 and L3-L4 could affect the descending L3 and L4 nerve roots, respectively. Electronically Signed   By: Merilyn Baba M.D.   On: 12/12/2021 03:16   NM Pulmonary Perfusion  Result Date: 12/14/2021 CLINICAL DATA:  History of DVT, multiple falls, right pleural effusion EXAM: NUCLEAR MEDICINE PERFUSION LUNG SCAN TECHNIQUE: Perfusion images were obtained in multiple projections after intravenous injection of radiopharmaceutical. Ventilation scans intentionally deferred if perfusion scan and chest x-ray adequate for interpretation during COVID 19 epidemic. RADIOPHARMACEUTICALS:  4.09 mCi Tc-42mMAA IV COMPARISON:  12/14/2021 FINDINGS: Planar images of the lungs demonstrates heterogeneous radiotracer uptake throughout the pulmonary parenchyma. There are no wedge-shaped perfusion defects to suggest pulmonary  embolus. Heterogeneous decreased uptake in the upper lobes consistent with known emphysema. Heterogeneous areas of decreased uptake within the lower lobes consistent with atelectasis on recent CT and x-ray. IMPRESSION: 1. By PISAPED criteria, no evidence of pulmonary embolus. No wedge-shaped perfusion defects. Heterogeneous uptake throughout the lungs consistent with emphysema and atelectasis as seen on recent CT. Electronically Signed   By: MRanda NgoM.D.   On: 12/14/2021 15:00   [place]ST (PLEURAL EFFUSION)  Result Date: 12/14/2021 CLINICAL DATA:  Pleural effusion EXAM: CHEST ULTRASOUND COMPARISON:  None. FINDINGS: Limited ultrasound exam of the right chest was performed. Images demonstrate a small pleural effusion, not amenable for safe image guided thoracentesis. No thoracentesis performed. IMPRESSION: Small right pleural effusion.  No thoracentesis performed. Electronically Signed   By: YAlbin FellingM.D.   On: 12/14/2021 15:45   [place]NAL  Result Date: 12/11/2021 CLINICAL DATA:  Acute kidney injury EXAM: RENAL / URINARY TRACT ULTRASOUND COMPLETE COMPARISON:  None. FINDINGS: Right Kidney: Renal measurements: 10.4 x 4.6 x 5.4 cm = volume: 135.2 mL. Echogenicity within normal limits. No mass or hydronephrosis visualized. Left Kidney: Renal measurements: 9.4 x 5 x 4 cm = volume: 99.1 mL. Echogenicity within normal limits. No mass or hydronephrosis visualized. Bladder: Appears normal for degree of bladder distention. Suboptimal evaluation due to bandage per technologist. Other: Ascites. IMPRESSION: No hydronephrosis. Ascites. Electronically Signed   By: PMacy MisM.D.   On: 12/11/2021 16:54   [place]acentesis  Result Date: 12/15/2021 INDICATION: Patient with ascites presents today for a diagnostic and therapeutic paracentesis. EXAM: ULTRASOUND GUIDED PARACENTESIS MEDICATIONS: 1% lidocaine 10 mL COMPLICATIONS: None immediate. PROCEDURE: Informed written consent was obtained from the patient after a  discussion of the risks, benefits and alternatives to treatment. A timeout was performed prior to the initiation of the procedure. Initial ultrasound scanning demonstrates a large amount of ascites  within the right lower abdominal quadrant. The right lower abdomen was prepped and draped in the usual sterile fashion. 1% lidocaine was used for local anesthesia. Following this, a 19 gauge, 7-cm, Yueh catheter was introduced. An ultrasound image was saved for documentation purposes. The paracentesis was performed. The catheter was removed and a dressing was applied. The patient tolerated the procedure well without immediate post procedural complication. FINDINGS: A total of approximately 2.7 L of clear yellow fluid was removed. Samples were sent to the laboratory as requested by the clinical team. IMPRESSION: Successful ultrasound-guided paracentesis yielding 2.7 liters of peritoneal fluid. Read by: Soyla Dryer, NP Electronically Signed   By: Jerilynn Mages.  Shick M.D.   On: 12/15/2021 13:14   DG Chest Port 1 View  Result Date: 12/14/2021 CLINICAL DATA:  Right pleural effusion. EXAM: PORTABLE CHEST 1 VIEW COMPARISON:  CT chest dated December 12, 2021 FINDINGS: The heart is enlarged with evidence of prior coronary artery bypass grafting. Emphysematous changes of the lungs. Left pleural thickening and scarring is again noted. There also multiple small bilateral opacities, likely scarring/atelectasis. Small right pleural effusion with atelectasis is unchanged. Osteopenia and degenerative changes of the thoracic spine. IMPRESSION: 1.  Stable cardiomegaly. 2. Emphysematous changes with multifocal bilateral atelectasis/scarring. Stable pleural thickening in the left upper lobe as well as in the mid left lung. Stable appearance of the small right pleural effusion with right basilar atelectasis. Electronically Signed   By: Keane Police D.O.   On: 12/14/2021 10:35   DG Chest Port 1 View  Result Date: 12/11/2021 CLINICAL DATA:   Aspiration pneumonia EXAM: PORTABLE CHEST 1 VIEW COMPARISON:  CT chest 12/09/2021 FINDINGS: Bilateral patchy interstitial and alveolar airspace opacities. No pleural effusion or pneumothorax. Stable cardiomegaly. Prior CABG. No acute osseous abnormality. IMPRESSION: 1. Bilateral patchy interstitial and alveolar airspace opacities concerning for multilobar pneumonia versus pulmonary edema. Electronically Signed   By: Kathreen Devoid M.D.   On: 12/11/2021 18:55   DG Chest Port 1 View  Result Date: 12/11/2021 CLINICAL DATA:  Shortness of breath EXAM: PORTABLE CHEST 1 VIEW COMPARISON:  Previous studies including the examination of 12/09/2021 FINDINGS: Transverse diameter of heart is increased. Central pulmonary vessels are prominent. Breathing motion limits evaluation of lung fields. There are no signs of alveolar pulmonary edema. There is asymmetric pleural density in the lateral aspect of left apex with no significant change. There is some crowding of markings in the lower lung fields. No new focal pulmonary consolidation is seen. There is blunting of lateral CP angles, more so on the left side. There is no pneumothorax. Multiple old fractures are seen in the left ribs. There is evidence of previous coronary bypass surgery. IMPRESSION: Cardiomegaly. There are no signs of alveolar pulmonary edema or new focal pulmonary consolidation. Small bilateral pleural effusions, more so on the left side. Electronically Signed   By: Elmer Picker M.D.   On: 12/11/2021 15:27   DG Chest Port 1 View  Result Date: 12/09/2021 CLINICAL DATA:  Shortness of breath. History of congestive heart failure, hypertension and diabetes. Questionable sepsis-evaluate for abnormality. EXAM: PORTABLE CHEST 1 VIEW COMPARISON:  Radiographs 12/01/2021 and 09/07/2021.  CT 11/07/2020. FINDINGS: 1058 hours. The heart size and mediastinal contours are stable status post median sternotomy and CABG. There is aortic atherosclerosis. There is chronic  lung disease with asymmetric pleural thickening and parenchymal scarring on the left. Compared with the most recent radiographs, there are increased interstitial and ground-glass opacities throughout both lungs which could reflect superimposed mild  edema. No confluent airspace opacity, pneumothorax or significant pleural effusion identified. Old rib fractures are present on the left. There is advanced arthropathy at both shoulders. IMPRESSION: Increased interstitial and ground-glass opacities throughout both lungs compared with most recent radiographs, suggesting possible mild edema. Underlying chronic pleuroparenchymal scarring as described. Electronically Signed   By: Richardean Sale M.D.   On: 12/09/2021 11:11   ECHOCARDIOGRAM COMPLETE  Result Date: 12/12/2021    ECHOCARDIOGRAM REPORT   Patient Name:   Carlis Blanchard. Date of Exam: 12/12/2021 Medical Rec #:  [id number]        Height:       67.0 in Accession #:    [id number]       Weight:       181.4 lb Date of Birth:  05-Nov-1946       BSA:          1.940 m Patient Age:    12 years         BP:           98/63 mmHg Patient Gender: M                HR:           62 bpm. Exam Location:  [facility] Procedure: 2D Echo Indications:     Bacteremia R78.81  History:         Patient has prior history of Echocardiogram examinations, most                  recent 11/10/2020.  Sonographer:     Kathlen Brunswick RDCS Referring Phys:  [place] Diagnosing Phys: [place]  1. Left ventricular ejection fraction, by estimation, is 60 to 65%. The left ventricle has normal function. The left ventricle has no regional wall motion abnormalities. Left ventricular diastolic parameters are indeterminate. There is the interventricular septum is flattened in systole, consistent with right ventricular pressure overload.  2. Right ventricular systolic function is severely reduced. The right ventricular size is severely enlarged. There is severely elevated pulmonary artery  systolic pressure. The estimated right ventricular systolic pressure is 53.6 mmHg.  3. Right atrial size was severely dilated.  4. The mitral valve is degenerative. Mild to moderate mitral valve regurgitation. No evidence of mitral stenosis.  5. Tricuspid valve regurgitation is severe.  6. The aortic valve is normal in structure. Aortic valve regurgitation is not visualized. No aortic stenosis is present.  7. The inferior vena cava is normal in size with greater than 50% respiratory variability, suggesting right atrial pressure of 3 mmHg.  8. Cannot exclude a small PFO. FINDINGS  Left Ventricle: Left ventricular ejection fraction, by estimation, is 60 to 65%. The left ventricle has normal function. The left ventricle has no regional wall motion abnormalities. The left ventricular internal cavity size was small. There is no left ventricular hypertrophy. The interventricular septum is flattened in systole, consistent with right ventricular pressure overload. Left ventricular diastolic parameters are indeterminate. Right Ventricle: The right ventricular size is severely enlarged. Right vetricular wall thickness was not well visualized. Right ventricular systolic function is severely reduced. There is severely elevated pulmonary artery systolic pressure. The tricuspid regurgitant velocity is 4.50 m/s, and with an assumed right atrial pressure of 10 mmHg, the estimated right ventricular systolic pressure is 64.4 mmHg. Left Atrium: Left atrial size was normal in size. Right Atrium: Right atrial size was severely dilated. Pericardium: There is no evidence of pericardial effusion. Mitral Valve: The  mitral valve is degenerative in appearance. Mild to moderate mitral valve regurgitation. No evidence of mitral valve stenosis. Tricuspid Valve: The tricuspid valve is normal in structure. Tricuspid valve regurgitation is severe. No evidence of tricuspid stenosis. Aortic Valve: The aortic valve is normal in structure. Aortic valve  regurgitation is not visualized. No aortic stenosis is present. Aortic valve peak gradient measures 5.5 mmHg. Pulmonic Valve: The pulmonic valve was grossly normal. Pulmonic valve regurgitation is not visualized. No evidence of pulmonic stenosis. Aorta: The aortic root is normal in size and structure. Venous: The inferior vena cava is normal in size with greater than 50% respiratory variability, suggesting right atrial pressure of 3 mmHg. IAS/Shunts: Cannot exclude a small PFO.  LEFT VENTRICLE PLAX 2D LVIDd:         4.90 cm     Diastology LVIDs:         3.50 cm     LV e' medial:    4.24 cm/s LV PW:         1.20 cm     LV E/e' medial:  9.8 LV IVS:        1.20 cm     LV e' lateral:   5.00 cm/s LVOT diam:     2.10 cm     LV E/e' lateral: 8.3 LV SV:         38 LV SV Index:   20 LVOT Area:     3.46 cm  LV Volumes (MOD) LV vol d, MOD A4C: 37.4 ml LV vol s, MOD A4C: 16.5 ml LV SV MOD A4C:     37.4 ml RIGHT VENTRICLE RV Basal diam:  5.80 cm RV S prime:     9.46 cm/s TAPSE (M-mode): 1.9 cm LEFT ATRIUM             Index        RIGHT ATRIUM           Index LA diam:        3.50 cm 1.80 cm/m   RA Area:     18.40 cm LA Vol (A2C):   30.8 ml 15.87 ml/m  RA Volume:   53.10 ml  27.37 ml/m LA Vol (A4C):   18.9 ml 9.74 ml/m LA Biplane Vol: 25.3 ml 13.04 ml/m  AORTIC VALVE                 PULMONIC VALVE AV Area (Vmax): 1.57 cm     PV Vmax:          0.90 m/s AV Vmax:        117.00 cm/s  PV Peak grad:     3.3 mmHg AV Peak Grad:   5.5 mmHg     PR End Diast Vel: 16.00 msec LVOT Vmax:      52.90 cm/s LVOT Vmean:     30.600 cm/s LVOT VTI:       0.110 m  AORTA Ao Root diam: 3.60 cm Ao Asc diam:  3.10 cm MITRAL VALVE               TRICUSPID VALVE MV Area (PHT): 3.34 cm    TV Peak grad:   64.8 mmHg MV Decel Time: 227 msec    TV Vmax:        4.03 m/s MV E velocity: 41.60 cm/s  TR Peak grad:   81.0 mmHg MV A velocity: 63.00 cm/s  TR Vmax:        450.00 cm/s MV E/A ratio:  0.66  SHUNTS                             Systemic VTI:  0.11 m                            Systemic Diam: 2.10 cm Donnelly Angelica Electronically signed by Donnelly Angelica Signature Date/Time: 12/12/2021/3:04:08 PM    Final    US LIVER DOPPLER  Result Date: 12/14/2021 CLINICAL DATA:  Cirrhosis.  Evaluate hepatic vasculature. EXAM: DUPLEX ULTRASOUND OF LIVER TECHNIQUE: Color and duplex Doppler ultrasound was performed to evaluate the hepatic in-flow and out-flow vessels. COMPARISON:  Chest CT - 12/12/2021 FINDINGS: There is diffuse increased slightly coarsened echogenicity of the hepatic parenchyma with nodularity hepatic contour compatible with clinical suspicion of cirrhosis. No discrete hepatic lesions. No intrahepatic biliary ductal dilatation. Portal Vein Velocities (normal hepatopetal directional flow) Main:  17 cm/sec Right:  7 cm/sec Left:  11 cm/sec Hepatic Vein Velocities (normal hepatofugal directional flow) Right:  29 cm/sec Middle:  20 cm/sec Left:  9 cm/sec Hepatic Artery Velocity:  162 cm/sec Splenic Vein Velocity:  11 cm/sec Spleen: Normal in size measuring 12.0 x 12.0 x 3.0 cm with calculated volume of 245 cc. Varices: None visualized Ascites: Small to moderate amount of intra-abdominal ascites. IMPRESSION: 1. Findings suggestive hepatic cirrhosis. No discrete hepatic lesions though further evaluation could be performed with nonemergent either cirrhotic protocol CT or (preferably) abdominal MRI as indicated. 2. Patent hepatic vasculature with normal directional flow. 3. Small to moderate amount of intra-abdominal ascites without evidence of splenomegaly. Electronically Signed   By: Sandi Mariscal M.D.   On: 12/14/2021 10:26   [place] EKG SITE RITE  Result Date: 12/15/2021 If [facility] image not attached, placement could not be confirmed due to current cardiac rhythm.  DG ESOPHAGUS W DOUBLE CM (HD)  Result Date: 12/11/2021 CLINICAL DATA:  A 76 year old male presents for evaluation of swallowing and dysphagia. EXAM: ESOPHOGRAM/BARIUM SWALLOW TECHNIQUE:  Single contrast examination was performed using  thin barium. FLUOROSCOPY: Fluoroscopy time: 2 minutes 48 seconds Fluoroscopy dose: 50.7 mGy COMPARISON:  Chest CT from December 09, 2021. FINDINGS: The exam was markedly limited due to the patient's debilitated state and difficulty in positioning the patient. Exam was performed in slightly LPO just off AP and RPO positioning. The esophagus was found to be markedly patulous above the GE junction where there was tortuosity and extensive tertiary contractions throughout the exam. Long segment narrowing of the GE junction was noted on multiple images though this could not be challenged with a large bolus and the presence of dysmotility also limited assessment of this area. This appears to be smooth and long segment. Along the distal medial esophagus there are small pulsion diverticula with distortion of the medial esophageal wall. Contrast did pass into the stomach aided by the administration of some water. Additional images could not be obtained due to coughing that the patient experienced though there was no visible "gross" aspiration.There may be some laryngeal penetration though this area was difficult to evaluate. In the RPO position marked "corkscrewing" and tortuosity of the proximal esophagus was noted with the patient in kyphotic position at baseline. IMPRESSION: 1. Markedly patulous esophagus above the GE junction with extensive tertiary contractions throughout the exam. "Corkscrewing" of the esophagus as well was noted with limited peristaltic activity distally though there was emptying of the esophagus despite segmental narrowing of the distal esophagus  into the stomach. Findings could relate to distal esophageal stricture but could not be well assessed due to the debilitated nature of the patient. Marked dysmotility and the long segment narrowing with beaked appearance raising the question achalasia. Esophageal manometry and endoscopy may be helpful to narrow  the differential. 2. Given difficulty with swallowing a dedicated swallow function may also be helpful. Electronically Signed   By: Zetta Bills M.D.   On: 12/11/2021 16:04    Microbiology: Results for orders placed or performed during the hospital encounter of 12/09/21  Blood Culture (routine x 2)     Status: Abnormal   Collection Time: 12/09/21 10:57 AM   Specimen: BLOOD  Result Value Ref Range Status   Specimen Description   Final    BLOOD LEFT WRIST Performed at [facility], [address] [place], [place] [address]    Special Requests   Final    BOTTLES DRAWN AEROBIC AND ANAEROBIC Blood Culture adequate volume Performed at [facility], [address], [place], [place] [address]    Culture  Setup Time   Final    GRAM POSITIVE COCCI AEROBIC BOTTLE ONLY CRITICAL RESULT CALLED TO, READ BACK BY AND VERIFIED WITH: JASON ROBBINS @ 0149 ON 12/10/21.Marland KitchenMarland KitchenTKR Performed at [facility], [place] [address], [place], [place] [address]    Culture STAPHYLOCOCCUS AUREUS (A)  Final   Report Status 12/12/2021 FINAL  Final   Organism ID, Bacteria STAPHYLOCOCCUS AUREUS  Final      Susceptibility   Staphylococcus aureus - MIC*    CIPROFLOXACIN <=0.5 SENSITIVE Sensitive     ERYTHROMYCIN <=0.25 SENSITIVE Sensitive     GENTAMICIN <=0.5 SENSITIVE Sensitive     OXACILLIN <=0.25 SENSITIVE Sensitive     TETRACYCLINE <=1 SENSITIVE Sensitive     VANCOMYCIN <=0.5 SENSITIVE Sensitive     TRIMETH/SULFA <=10 SENSITIVE Sensitive     CLINDAMYCIN <=0.25 SENSITIVE Sensitive     RIFAMPIN <=0.5 SENSITIVE Sensitive     Inducible Clindamycin NEGATIVE Sensitive     * STAPHYLOCOCCUS AUREUS  Blood Culture ID Panel (Reflexed)     Status: Abnormal   Collection Time: 12/09/21 10:57 AM  Result Value Ref Range Status   Enterococcus faecalis NOT DETECTED NOT DETECTED Final   Enterococcus Faecium NOT DETECTED NOT DETECTED Final   Listeria monocytogenes NOT DETECTED NOT DETECTED Final    Staphylococcus species DETECTED (A) NOT DETECTED Final    Comment: CRITICAL RESULT CALLED TO, READ BACK BY AND VERIFIED WITH: JASON ROBBINS @ 0149 ON 12/10/21.Marland KitchenMarland KitchenTKR    Staphylococcus aureus (BCID) DETECTED (A) NOT DETECTED Final    Comment: CRITICAL RESULT CALLED TO, READ BACK BY AND VERIFIED WITH: JASON ROBBINS @ 0149 ON 12/10/21.Marland KitchenMarland KitchenTKR    Staphylococcus epidermidis NOT DETECTED NOT DETECTED Final   Staphylococcus lugdunensis NOT DETECTED NOT DETECTED Final   Streptococcus species NOT DETECTED NOT DETECTED Final   Streptococcus agalactiae NOT DETECTED NOT DETECTED Final   Streptococcus pneumoniae NOT DETECTED NOT DETECTED Final   Streptococcus pyogenes NOT DETECTED NOT DETECTED Final   A.calcoaceticus-baumannii NOT DETECTED NOT DETECTED Final   Bacteroides fragilis NOT DETECTED NOT DETECTED Final   Enterobacterales NOT DETECTED NOT DETECTED Final   Enterobacter cloacae complex NOT DETECTED NOT DETECTED Final   Escherichia coli NOT DETECTED NOT DETECTED Final   Klebsiella aerogenes NOT DETECTED NOT DETECTED Final   Klebsiella oxytoca NOT DETECTED NOT DETECTED Final   Klebsiella pneumoniae NOT DETECTED NOT DETECTED Final   Proteus species NOT DETECTED NOT DETECTED Final  Salmonella species NOT DETECTED NOT DETECTED Final   Serratia marcescens NOT DETECTED NOT DETECTED Final   Haemophilus influenzae NOT DETECTED NOT DETECTED Final   Neisseria meningitidis NOT DETECTED NOT DETECTED Final   Pseudomonas aeruginosa NOT DETECTED NOT DETECTED Final   Stenotrophomonas maltophilia NOT DETECTED NOT DETECTED Final   Candida albicans NOT DETECTED NOT DETECTED Final   Candida auris NOT DETECTED NOT DETECTED Final   Candida glabrata NOT DETECTED NOT DETECTED Final   Candida krusei NOT DETECTED NOT DETECTED Final   Candida parapsilosis NOT DETECTED NOT DETECTED Final   Candida tropicalis NOT DETECTED NOT DETECTED Final   Cryptococcus neoformans/gattii NOT DETECTED NOT DETECTED Final   Meth resistant  mecA/C and MREJ NOT DETECTED NOT DETECTED Final    Comment: Performed at [facility], [address] [place], [place] [address]  Blood Culture (routine x 2)     Status: None   Collection Time: 12/09/21 10:59 AM   Specimen: BLOOD  Result Value Ref Range Status   Specimen Description BLOOD RIGHT [facility]  Final   Special Requests   Final    BOTTLES DRAWN AEROBIC AND ANAEROBIC Blood Culture adequate volume   Culture   Final    NO GROWTH 5 DAYS Performed at [facility], [address] [place], [place] [address]    Report Status 12/14/2021 FINAL  Final  Urine Culture     Status: Abnormal   Collection Time: 12/09/21 11:19 AM   Specimen: In/Out Cath Urine  Result Value Ref Range Status   Specimen Description   Final    IN/OUT CATH URINE Performed at [facility], [address], [place], [place] [address]    Special Requests   Final    NONE Performed at [facility], [place], [place] [address]    Culture 1,000 COLONIES/mL ENTEROCOCCUS FAECALIS (A)  Final   Report Status 12/12/2021 FINAL  Final   Organism ID, Bacteria ENTEROCOCCUS FAECALIS (A)  Final      Susceptibility   Enterococcus faecalis - MIC*    AMPICILLIN <=2 SENSITIVE Sensitive     NITROFURANTOIN <=16 SENSITIVE Sensitive     VANCOMYCIN 1 SENSITIVE Sensitive     * 1,000 COLONIES/mL ENTEROCOCCUS FAECALIS  Resp Panel by RT-PCR (Flu A&B, Covid) Nasopharyngeal Swab     Status: None   Collection Time: 12/09/21 11:19 AM   Specimen: Nasopharyngeal Swab; Nasopharyngeal(NP) swabs in vial transport medium  Result Value Ref Range Status   SARS Coronavirus 2 by RT PCR NEGATIVE NEGATIVE Final    Comment: (NOTE) SARS-CoV-2 target nucleic acids are NOT DETECTED.  The SARS-CoV-2 RNA is generally detectable in upper respiratory specimens during the acute phase of infection. The lowest concentration of SARS-CoV-2 viral copies this assay can detect is 138 copies/mL. A negative result  does not preclude SARS-Cov-2 infection and should not be used as the sole basis for treatment or other patient management decisions. A negative result may occur with  improper specimen collection/handling, submission of specimen other than nasopharyngeal swab, presence of viral mutation(s) within the areas targeted by this assay, and inadequate number of viral copies(<138 copies/mL). A negative result must be combined with clinical observations, patient history, and epidemiological information. The expected result is Negative.  Fact Sheet for Patients:  EntrepreneurPulse.com.au  Fact Sheet for Healthcare Providers:  IncredibleEmployment.be  This test is no t yet approved or cleared by the [place] FDA and  has been authorized for detection and/or diagnosis of SARS-CoV-2 by  FDA under an Emergency Use Authorization (EUA). This EUA will remain  in effect (meaning this test can be used) for the duration of the COVID-19 declaration under Section 564(b)(1) of the Act, 21 U.S.C.section 360bbb-3(b)(1), unless the authorization is terminated  or revoked sooner.       Influenza A by PCR NEGATIVE NEGATIVE Final   Influenza B by PCR NEGATIVE NEGATIVE Final    Comment: (NOTE) The Xpert Xpress SARS-CoV-2/FLU/RSV plus assay is intended as an aid in the diagnosis of influenza from Nasopharyngeal swab specimens and should not be used as a sole basis for treatment. Nasal washings and aspirates are unacceptable for Xpert Xpress SARS-CoV-2/FLU/RSV testing.  Fact Sheet for Patients: EntrepreneurPulse.com.au  Fact Sheet for Healthcare Providers: IncredibleEmployment.be  This test is not yet approved or cleared by the [place] FDA and has been authorized for detection and/or diagnosis of SARS-CoV-2 by FDA under an Emergency Use Authorization (EUA). This EUA will remain in effect (meaning this test can be used) for  the duration of the COVID-19 declaration under Section 564(b)(1) of the Act, 21 U.S.C. section 360bbb-3(b)(1), unless the authorization is terminated or revoked.  Performed at [facility], [address] [place], [place] [address]   MRSA Next Gen by PCR, Nasal     Status: Abnormal   Collection Time: 12/09/21  1:24 PM   Specimen: Nasal Mucosa; Nasal Swab  Result Value Ref Range Status   MRSA by PCR Next Gen DETECTED (A) NOT DETECTED Final    Comment: RESULT CALLED TO, READ BACK BY AND VERIFIED WITH: JON COOK 1525 12/09/21 MU (NOTE) The GeneXpert MRSA Assay (FDA approved for NASAL specimens only), is one component of a comprehensive MRSA colonization surveillance program. It is not intended to diagnose MRSA infection nor to guide or monitor treatment for MRSA infections. Test performance is not FDA approved in patients less than 89 years old. Performed at [facility], [address] [place], [place] [address]   Aerobic Culture w Gram Stain (superficial specimen)     Status: None   Collection Time: 12/10/21  5:31 PM   Specimen: SCALP; Wound  Result Value Ref Range Status   Specimen Description   Final    SCALP Performed at [facility], [address], [place], [place] [address]    Special Requests   Final    NONE Performed at [facility], [address] [place], [place] [address]    Gram Stain   Final    FEW WBC PRESENT, PREDOMINANTLY MONONUCLEAR MODERATE GRAM POSITIVE COCCI Performed at [facility], [place] [address], [place], [place] [address]    Culture ABUNDANT STAPHYLOCOCCUS AUREUS  Final   Report Status 12/14/2021 FINAL  Final   Organism ID, Bacteria STAPHYLOCOCCUS AUREUS  Final      Susceptibility   Staphylococcus aureus - MIC*    CIPROFLOXACIN <=0.5 SENSITIVE Sensitive     ERYTHROMYCIN <=0.25 SENSITIVE Sensitive     GENTAMICIN <=0.5 SENSITIVE Sensitive     OXACILLIN 0.5 SENSITIVE Sensitive     TETRACYCLINE <=1  SENSITIVE Sensitive     VANCOMYCIN <=0.5 SENSITIVE Sensitive     TRIMETH/SULFA <=10 SENSITIVE Sensitive     CLINDAMYCIN <=0.25 SENSITIVE Sensitive     RIFAMPIN <=0.5 SENSITIVE Sensitive     Inducible Clindamycin NEGATIVE Sensitive     * ABUNDANT STAPHYLOCOCCUS AUREUS  CULTURE, BLOOD (ROUTINE X 2) w Reflex to ID Panel     Status: None   Collection Time: 12/11/21  6:59 AM   Specimen: BLOOD  Result Value Ref Range Status   Specimen Description BLOOD LEFT WRIST  Final   Special Requests   Final    BOTTLES DRAWN AEROBIC AND ANAEROBIC Blood Culture adequate volume   Culture   Final    NO GROWTH 5 DAYS Performed at [facility], [address] [place], [place] [address]    Report Status 12/16/2021 FINAL  Final  CULTURE, BLOOD (ROUTINE X 2) w Reflex to ID Panel     Status: None   Collection Time: 12/11/21  7:16 AM   Specimen: BLOOD  Result Value Ref Range Status   Specimen Description BLOOD LEFT ARM  Final   Special Requests   Final    BOTTLES DRAWN AEROBIC AND ANAEROBIC Blood Culture adequate volume   Culture   Final    NO GROWTH 5 DAYS Performed at [facility], [address] [place], [place] [address]    Report Status 12/16/2021 FINAL  Final  Gastrointestinal Panel by PCR , Stool     Status: None   Collection Time: 12/13/21 12:15 PM   Specimen: Stool  Result Value Ref Range Status   Campylobacter species NOT DETECTED NOT DETECTED Final   Plesimonas shigelloides NOT DETECTED NOT DETECTED Final   Salmonella species NOT DETECTED NOT DETECTED Final   Yersinia enterocolitica NOT DETECTED NOT DETECTED Final   Vibrio species NOT DETECTED NOT DETECTED Final   Vibrio cholerae NOT DETECTED NOT DETECTED Final   Enteroaggregative E coli (EAEC) NOT DETECTED NOT DETECTED Final   Enteropathogenic E coli (EPEC) NOT DETECTED NOT DETECTED Final   Enterotoxigenic E coli (ETEC) NOT DETECTED NOT DETECTED Final   Shiga like toxin producing E coli (STEC) NOT DETECTED NOT  DETECTED Final   Shigella/Enteroinvasive E coli (EIEC) NOT DETECTED NOT DETECTED Final   Cryptosporidium NOT DETECTED NOT DETECTED Final   Cyclospora cayetanensis NOT DETECTED NOT DETECTED Final   Entamoeba histolytica NOT DETECTED NOT DETECTED Final   Giardia lamblia NOT DETECTED NOT DETECTED Final   Adenovirus F40/41 NOT DETECTED NOT DETECTED Final   Astrovirus NOT DETECTED NOT DETECTED Final   Norovirus GI/GII NOT DETECTED NOT DETECTED Final   Rotavirus A NOT DETECTED NOT DETECTED Final   Sapovirus (I, II, IV, and V) NOT DETECTED NOT DETECTED Final    Comment: Performed at [facility], [address] [place], [place] [address]  Body fluid culture w Gram Stain     Status: None   Collection Time: 12/15/21 11:20 AM   Specimen: PATH Cytology Peritoneal fluid  Result Value Ref Range Status   Specimen Description   Final    PERITONEAL Performed at [facility], [address], [place], [place] [address]    Special Requests   Final    NONE Performed at [facility], [address] [place], [place] [address]    Gram Stain   Final    WBC PRESENT,BOTH PMN AND MONONUCLEAR NO ORGANISMS SEEN CYTOSPIN SMEAR    Culture   Final    NO GROWTH Performed at [facility], [address] [place], [place] [address]    Report Status 12/18/2021 FINAL  Final  SARS CORONAVIRUS 2 (TAT 6-24 HRS) Nasopharyngeal Nasopharyngeal Swab     Status: None   Collection Time: 12/16/21  2:23 PM   Specimen: Nasopharyngeal Swab  Result Value Ref Range Status   SARS Coronavirus 2 NEGATIVE NEGATIVE Final    Comment: (NOTE) SARS-CoV-2 target nucleic acids  are NOT DETECTED.  The SARS-CoV-2 RNA is generally detectable in upper and lower respiratory specimens during the acute phase of infection. Negative results do not preclude SARS-CoV-2 infection, do not rule out co-infections with other pathogens, and should not be used as the sole basis for treatment or other patient  management decisions. Negative results must be combined with clinical observations, patient history, and epidemiological information. The expected result is Negative.  Fact Sheet for Patients: SugarRoll.be  Fact Sheet for Healthcare Providers: https://www.woods-mathews.com/  This test is not yet approved or cleared by the [place] FDA and  has been authorized for detection and/or diagnosis of SARS-CoV-2 by FDA under an Emergency Use Authorization (EUA). This EUA will remain  in effect (meaning this test can be used) for the duration of the COVID-19 declaration under Se ction 564(b)(1) of the Act, 21 U.S.C. section 360bbb-3(b)(1), unless the authorization is terminated or revoked sooner.  Performed at [facility], [place] [address], [place], [place] [address]     Labs: CBC: Recent Labs  Lab 12/12/21 [phone] 12/13/21 0544 12/14/21 0409 12/15/21 0537  WBC 6.7 5.8 6.8 6.9  HGB 10.8* 10.3* 11.2* 10.9*  HCT 32.2* 29.9* 33.2* 32.2*  MCV 111.0* 108.7* 110.7* 113.4*  PLT 172 157 188 468   Basic Metabolic Panel: Recent Labs  Lab 12/12/21 0414 12/13/21 0544 12/14/21 0409 12/15/21 0537 12/16/21 0530 12/17/21 0555 12/18/21 0856  NA 133*   < > 131* 134* 138 134* 134*  K 4.3   < > 3.8 3.8 3.0* 3.9 4.1  CL 101   < > 101 101 111 102 102  CO2 23   < > 24 25 21* 25 25  GLUCOSE 101*   < > 102* 117* 103* 109* 101*  BUN 45*   < > 47* 51* 40* 45* 45*  CREATININE 1.97*   < > 2.22* 2.27* 1.81* 1.95* 1.73*  CALCIUM 7.3*   < > 7.5* 7.6* 6.3* 8.1* 8.3*  MG 2.2  --   --   --   --  1.8  --    < > = values in this interval not displayed.   Liver Function Tests: Recent Labs  Lab 12/15/21 0537  ALBUMIN 2.0*   CBG: Recent Labs  Lab 12/17/21 1112 12/17/21 1821 12/17/21 2248 12/18/21 0748 12/18/21 1155  GLUCAP 142* 121* 124* 100* 112*    Discharge time spent: greater than 30 minutes.  Signed: Lorella Nimrod, MD [facility] 12/18/2021

## 2021-12-18 NOTE — Progress Notes (Signed)
PT Cancellation Note  Patient Details Name: Luis [name]. MRN: [id number] DOB: 12-01-1945   Cancelled Treatment:    Reason Eval/Treat Not Completed: Patient declined, no reason specified;Other (comment). Pt received on commode, RN present requesting PT assist to stand from toilet. Pt modA+2 to stand to RW and dependent for perihygiene. Pt declining PT intervention at this time due to fatigue despite education and encouragement. Pt assisted by PT to bed with minguard and RW then minA to supine and maxA+2 to scoot up in bed. Pt educated on LE therex to perform and encouraged to ambulate with nursing staff to bathroom for maintaining strength and independence with walking tasks. Will re-attempt at later time and date.   Salem Caster. Fairly IV, PT, DPT Physical Therapist- [facility]  12/18/2021, 11:25 AM

## 2021-12-18 NOTE — Care Management Important Message (Signed)
Important Message  Patient Details  Name: Luis [name]. MRN: [id number] Date of Birth: 06-20-1946   Medicare Important Message Given:  Yes     Juliann Pulse A Ameet Sandy 12/18/2021, 11:26 AM

## 2021-12-18 NOTE — Plan of Care (Signed)
Patient sleeping between care. Aox4. No new changes in assessment. Call bell within reach.  PLAN OF CARE ONGOING Problem: Education: Goal: Knowledge of General Education information will improve Description: Including pain rating scale, medication(s)/side effects and non-pharmacologic comfort measures Outcome: Progressing   Problem: Health Behavior/Discharge Planning: Goal: Ability to manage health-related needs will improve Outcome: Progressing   Problem: Clinical Measurements: Goal: Ability to maintain clinical measurements within normal limits will improve Outcome: Progressing Goal: Will remain free from infection Outcome: Progressing Goal: Diagnostic test results will improve Outcome: Progressing Goal: Respiratory complications will improve Outcome: Progressing Goal: Cardiovascular complication will be avoided Outcome: Progressing   Problem: Activity: Goal: Risk for activity intolerance will decrease Outcome: Progressing   Problem: Nutrition: Goal: Adequate nutrition will be maintained Outcome: Progressing   Problem: Coping: Goal: Level of anxiety will decrease Outcome: Progressing   Problem: Elimination: Goal: Will not experience complications related to bowel motility Outcome: Progressing Goal: Will not experience complications related to urinary retention Outcome: Progressing   Problem: Pain Managment: Goal: General experience of comfort will improve Outcome: Progressing   Problem: Safety: Goal: Ability to remain free from injury will improve Outcome: Progressing   Problem: Skin Integrity: Goal: Risk for impaired skin integrity will decrease Outcome: Progressing

## 2021-12-18 NOTE — TOC Transition Note (Signed)
Transition of Care [facility]) - CM/SW Discharge Note   Patient Details  Name: Luis [name]. MRN: [id number] Date of Birth: December 14, 1945  Transition of Care [facility]) CM/SW Contact:  Luis [name], Luis [name] Phone Number: 12/18/2021, 9:44 AM   Clinical Narrative:     Patient will [facility] Anticipated DC date: 12/18/21 Family notified: son Programmer, systems byJohnanna [name]  Per MD patient ready for DC to [facility]. RN, patient, patient's family, and facility notified of DC.  RN given number for report  [phone] Room 502 . DC packet on chart. Ambulance transport to be requested for patient pending dc summary.    Pricilla Riffle, Luis [name]    Final next level of care: Skilled Nursing Facility Barriers to Discharge: No Barriers Identified   Patient Goals and CMS Choice Patient states their goals for this hospitalization and ongoing recovery are:: to go home CMS Medicare.gov Compare Post Acute Care list provided to:: Patient Represenative (must comment) (son Luis [name]) Choice offered to / list presented to : Adult Children  Discharge Placement              Patient chooses bed at: [facility] Patient to be transferred to facility by: [facility] Name of family member notified: son Luis [name] Patient and family notified of of transfer: 12/18/21  Discharge Plan and Services                                     Social Determinants of Health (SDOH) Interventions     Readmission Risk Interventions No flowsheet data found.

## 2021-12-18 NOTE — Progress Notes (Signed)
Report called to [facility]. Son at bedside and updated. IV removed, PICC line intact and to discharge with for Abx therapy. No further questions at this time.

## 2021-12-18 NOTE — TOC Progression Note (Signed)
Transition of Care (TOC) - Progression Note    Patient Details  Name: Luis [name]. MRN: [id number] Date of Birth: May 18, 1946  Transition of Care [facility]) CM/SW [place], RN Phone Number: 12/18/2021, 12:21 PM  Clinical Narrative:   Called EMS to transport to [facility], There are 4 ahead of him, The Son is in the room and aware, He will go to room 502    Expected Discharge Plan:  (ILF) Barriers to Discharge: No Barriers Identified  Expected Discharge Plan and Services Expected Discharge Plan:  (ILF)         Expected Discharge Date: 12/18/21                                     Social Determinants of Health (SDOH) Interventions    Readmission Risk Interventions No flowsheet data found.

## 2021-12-22 ENCOUNTER — Telehealth: Payer: Self-pay | Admitting: *Deleted

## 2021-12-22 ENCOUNTER — Inpatient Hospital Stay: Payer: Medicare Other | Admitting: Oncology

## 2021-12-22 NOTE — Telephone Encounter (Signed)
Patient's son called to report that he is in rehab and will not make his appointment today.

## 2021-12-22 NOTE — Telephone Encounter (Signed)
R/s to next available appt if facility/ family can bring him. Not sure what facility he is in, may need to contact family.   Labs, then MD 1 week later.

## 2021-12-22 NOTE — Telephone Encounter (Signed)
Spoke to son about updated appts. Pt is at [facility]. Pt son is aware of appts and message left on pt's facility room number. Will also mail appts, pt sons stated he would not be at  facility that long.

## 2021-12-27 ENCOUNTER — Other Ambulatory Visit: Payer: Self-pay

## 2021-12-27 ENCOUNTER — Emergency Department
Admission: EM | Admit: 2021-12-27 | Discharge: 2021-12-27 | Disposition: A | Discharge status: Home / Self Care | Payer: Medicare Other | Attending: Emergency Medicine | Admitting: Emergency Medicine

## 2021-12-27 ENCOUNTER — Encounter: Payer: Self-pay | Admitting: Emergency Medicine

## 2021-12-27 ENCOUNTER — Emergency Department: Payer: Medicare Other

## 2021-12-27 DIAGNOSIS — Z452 Encounter for adjustment and management of vascular access device: Secondary | ICD-10-CM | POA: Diagnosis not present

## 2021-12-27 DIAGNOSIS — Z85828 Personal history of other malignant neoplasm of skin: Secondary | ICD-10-CM | POA: Diagnosis not present

## 2021-12-27 DIAGNOSIS — I2510 Atherosclerotic heart disease of native coronary artery without angina pectoris: Secondary | ICD-10-CM | POA: Diagnosis not present

## 2021-12-27 DIAGNOSIS — E119 Type 2 diabetes mellitus without complications: Secondary | ICD-10-CM | POA: Diagnosis not present

## 2021-12-27 DIAGNOSIS — I509 Heart failure, unspecified: Secondary | ICD-10-CM | POA: Insufficient documentation

## 2021-12-27 DIAGNOSIS — I110 Hypertensive heart disease with heart failure: Secondary | ICD-10-CM | POA: Diagnosis not present

## 2021-12-27 DIAGNOSIS — Z7982 Long term (current) use of aspirin: Secondary | ICD-10-CM | POA: Diagnosis not present

## 2021-12-27 DIAGNOSIS — R1084 Generalized abdominal pain: Secondary | ICD-10-CM

## 2021-12-27 DIAGNOSIS — Z7984 Long term (current) use of oral hypoglycemic drugs: Secondary | ICD-10-CM | POA: Diagnosis not present

## 2021-12-27 DIAGNOSIS — R188 Other ascites: Secondary | ICD-10-CM | POA: Insufficient documentation

## 2021-12-27 DIAGNOSIS — Z8616 Personal history of COVID-19: Secondary | ICD-10-CM | POA: Diagnosis not present

## 2021-12-27 DIAGNOSIS — R109 Unspecified abdominal pain: Secondary | ICD-10-CM | POA: Insufficient documentation

## 2021-12-27 DIAGNOSIS — I5032 Chronic diastolic (congestive) heart failure: Secondary | ICD-10-CM | POA: Diagnosis not present

## 2021-12-27 LAB — CBC WITH DIFFERENTIAL/PLATELET
Abs Immature Granulocytes: 0.02 10*3/uL (ref 0.00–0.07)
Basophils Absolute: 0.1 10*3/uL (ref 0.0–0.1)
Basophils Relative: 1 %
Eosinophils Absolute: 0.5 10*3/uL (ref 0.0–0.5)
Eosinophils Relative: 10 %
HCT: 30.8 % — ABNORMAL LOW (ref 39.0–52.0)
Hemoglobin: 9.8 g/dL — ABNORMAL LOW (ref 13.0–17.0)
Immature Granulocytes: 0 %
Lymphocytes Relative: 17 %
Lymphs Abs: 1 10*3/uL (ref 0.7–4.0)
MCH: 37.5 pg — ABNORMAL HIGH (ref 26.0–34.0)
MCHC: 31.8 g/dL (ref 30.0–36.0)
MCV: 118 fL — ABNORMAL HIGH (ref 80.0–100.0)
Monocytes Absolute: 0.8 10*3/uL (ref 0.1–1.0)
Monocytes Relative: 14 %
Neutro Abs: 3.2 10*3/uL (ref 1.7–7.7)
Neutrophils Relative %: 58 %
Platelets: 202 10*3/uL (ref 150–400)
RBC: 2.61 MIL/uL — ABNORMAL LOW (ref 4.22–5.81)
RDW: 18.6 % — ABNORMAL HIGH (ref 11.5–15.5)
WBC: 5.7 10*3/uL (ref 4.0–10.5)
nRBC: 0 % (ref 0.0–0.2)

## 2021-12-27 LAB — COMPREHENSIVE METABOLIC PANEL
ALT: <5 U/L (ref 0–44)
AST: 39 U/L (ref 15–41)
Albumin: 2.1 g/dL — ABNORMAL LOW (ref 3.5–5.0)
Alkaline Phosphatase: 82 U/L (ref 38–126)
Anion gap: 13 (ref 5–15)
BUN: 52 mg/dL — ABNORMAL HIGH (ref 8–23)
CO2: 21 mmol/L — ABNORMAL LOW (ref 22–32)
Calcium: 7.6 mg/dL — ABNORMAL LOW (ref 8.9–10.3)
Chloride: 101 mmol/L (ref 98–111)
Creatinine, Ser: 2.81 mg/dL — ABNORMAL HIGH (ref 0.61–1.24)
GFR, Estimated: 22 mL/min — ABNORMAL LOW (ref >60)
Glucose, Bld: 108 mg/dL — ABNORMAL HIGH (ref 70–99)
Potassium: 3.5 mmol/L (ref 3.5–5.1)
Sodium: 135 mmol/L (ref 135–145)
Total Bilirubin: 0.9 mg/dL (ref 0.3–1.2)
Total Protein: 7.1 g/dL (ref 6.5–8.1)

## 2021-12-27 LAB — BRAIN NATRIURETIC PEPTIDE: B Natriuretic Peptide: 512.4 pg/mL — ABNORMAL HIGH (ref 0.0–100.0)

## 2021-12-27 MED ORDER — LIDOCAINE HCL (PF) 1 % IJ SOLN
5.0000 mL | Freq: Once | INTRAMUSCULAR | Status: DC
Start: 2021-12-27 — End: 2021-12-28

## 2021-12-27 MED ORDER — SODIUM CHLORIDE 0.9 % IV BOLUS
500.0000 mL | Freq: Once | INTRAVENOUS | Status: AC
  Administered 2021-12-27: 500 mL via INTRAVENOUS

## 2021-12-27 NOTE — ED Notes (Signed)
Emptied 2199ml from pt drainage bag from paracentesis

## 2021-12-27 NOTE — ED Notes (Signed)
Emptied 1000 from bag from paracentesis

## 2021-12-27 NOTE — ED Notes (Addendum)
Spoke with weekend supervisor at facility ,[facility], and gave update with plans to dc

## 2021-12-27 NOTE — ED Triage Notes (Signed)
Pt ems from [facility] (rehab) for abd pain that started approx 1 week. Pt with ascites - pt unsure for how long. Pt states that he had fluid pulled off abd last week. Pt doe not seem in acute distress. On 2LNC chronic

## 2021-12-27 NOTE — ED Notes (Signed)
Providers at bedside for procedure.

## 2021-12-27 NOTE — ED Provider Notes (Addendum)
[facility] Provider Note    Event Date/Time   First MD Initiated Contact with Patient 12/27/21 1511     (approximate)  History   Chief Complaint: Abdominal Pain  HPI  Luis Statzer. is a 76 y.o. male with a past medical history of anxiety, CHF, diabetes, hypertension, hyperlipidemia, presents to the emergency department for abdominal pain.  According to the patient he has a history of ascites, said approximately 2 weeks ago he had to have 2 L drained off his abdomen and felt much better.  Patient states he has been experiencing some intermittent abdominal discomfort which she describes more as a tightness or pressure sensation.  Patient believes he needs fluid drawn off the abdomen again.  Patient denies any vomiting or diarrhea.  Denies any fever.  Patient is currently at a rehab nursing facility.  Physical Exam   Triage Vital Signs: ED Triage Vitals  Enc Vitals Group     BP 12/27/21 1444 138/85     Pulse Rate 12/27/21 1444 87     Resp 12/27/21 1444 (!) 24     Temp 12/27/21 1444 97.9 F (36.6 C)     Temp Source 12/27/21 1444 Oral     SpO2 12/27/21 1444 99 %     Weight 12/27/21 1449 192 lb 0.3 oz (87.1 kg)     Height 12/27/21 1449 5' 6" (1.676 m)     Head Circumference --      Peak Flow --      Pain Score 12/27/21 1449 5     Pain Loc --      Pain Edu? --      Excl. in [place]? --     Most recent vital signs: Vitals:   12/27/21 1444  BP: 138/85  Pulse: 87  Resp: (!) 24  Temp: 97.9 F (36.6 C)  SpO2: 99%    General: Awake, no distress.  CV:  Good peripheral perfusion.  Regular rate and rhythm  Resp:  Normal effort.  Equal breath sounds bilaterally.  Abd:  Soft, moderate distention.  Benign abdomen without tenderness. Other:  No significant lower extremity edema.  PICC line in right upper extremity.   ED Results / Procedures / Treatments   EKG  EKG viewed and interpreted by myself shows a normal sinus rhythm 80 bpm with a narrow QRS,  left axis deviation, largely normal intervals, nonspecific ST changes.  Occasional PVC.  RADIOLOGY  I reviewed CT images, large volume of ascites no other acute abnormality seen on my evaluation. Radiology is read the CT is large volume ascites with cirrhosis wall thickening of the cecum gallstones without other signs of cholecystitis lytic lesions consistent multiple myeloma which is known.   MEDICATIONS ORDERED IN ED: Medications  sodium chloride 0.9 % bolus 500 mL (has no administration in time range)     IMPRESSION / MDM / ASSESSMENT AND PLAN / ED COURSE  I reviewed the triage vital signs and the nursing notes.  Patient presents emergency department for abdominal pain.  Patient states throughout the week he has felt increasing distention and discomfort in the abdomen.  Denies any fever.  Patient states while eating lunch today the pain worsened so he came to the emergency department hoping to have fluid drawn off the abdomen.  Reassuringly patient has a benign abdominal exam he does have moderate distention with dull percussion consistent with ascites but no tenderness, no concern for SBP.  Patient's chemistry does show a creatinine  appears to be worsening compared to baseline 2.8, 1.9 10 days ago.  We will dose 500 cc of normal saline.  Patient satting well 99% on his typical 2 L.  Given the patient's complaint of abdominal pain, worse today we will obtain CT imaging of abdomen/pelvis to further evaluate.  However after speaking to the patient more in depth he describes the discomfort more as a tightness sensation states he felt the same way 2 weeks ago before he had a paracentesis. ° °I have personally reviewed the CT imaging appear to show a large volume of ascites otherwise no significant findings on my evaluation. ° °CT scan shows no other significant findings.  Given the patient's reassuring CT scan and his discomfort consistent with abdominal distention due to ascites will perform a  bedside paracentesis to remove 2 L of fluid for patient comfort.  Patient agreeable with plan and verbally consented. ° °.Paracentesis ° °Date/Time: 12/27/2021 5:50 PM °Performed by: , , MD °Authorized by: , , MD  ° °Consent:  °  Consent obtained:  Verbal °  Consent given by:  Patient °  Risks, benefits, and alternatives were discussed: yes   °  Risks discussed:  Bleeding and bowel perforation °  Alternatives discussed:  Delayed treatment °Universal protocol:  °  Test results available: yes   °  Site/side marked: yes   °  Immediately prior to procedure, a time out was called: yes   °  Patient identity confirmed:  Verbally with patient °Pre-procedure details:  °  Procedure purpose:  Therapeutic °  Preparation: Patient was prepped and draped in usual sterile fashion   °Anesthesia:  °  Anesthesia method:  Local infiltration °  Local anesthetic:  Lidocaine 1% w/o epi °Procedure details:  °  Needle gauge:  20 °  Ultrasound guidance: yes   °  Puncture site:  R lower quadrant °  Fluid removed amount:  3L °  Fluid appearance:  Amber °  Dressing:  4x4 sterile gauze °Post-procedure details:  °  Procedure completion:  Tolerated well, no immediate complications ° ° ° °FINAL CLINICAL IMPRESSION(S) / ED DIAGNOSES  ° °Abdominal pain ° °Rx / DC Orders  ° °PCP follow-up ° °Note:  This document was prepared using Dragon voice recognition software and may include unintentional dictation errors. °  °, , MD °12/27/21 1752 ° °  °, , MD °12/27/21 1819 ° °

## 2021-12-28 ENCOUNTER — Emergency Department: Payer: Medicare Other

## 2021-12-28 ENCOUNTER — Emergency Department
Admission: EM | Admit: 2021-12-28 | Discharge: 2021-12-28 | Disposition: A | Discharge status: Skilled Nursing Facility | Payer: Medicare Other | Attending: Emergency Medicine | Admitting: Emergency Medicine

## 2021-12-28 DIAGNOSIS — Z8616 Personal history of COVID-19: Secondary | ICD-10-CM | POA: Insufficient documentation

## 2021-12-28 DIAGNOSIS — Z452 Encounter for adjustment and management of vascular access device: Secondary | ICD-10-CM | POA: Diagnosis not present

## 2021-12-28 DIAGNOSIS — I5032 Chronic diastolic (congestive) heart failure: Secondary | ICD-10-CM | POA: Insufficient documentation

## 2021-12-28 DIAGNOSIS — Z7982 Long term (current) use of aspirin: Secondary | ICD-10-CM | POA: Insufficient documentation

## 2021-12-28 DIAGNOSIS — Z95828 Presence of other vascular implants and grafts: Secondary | ICD-10-CM

## 2021-12-28 DIAGNOSIS — I110 Hypertensive heart disease with heart failure: Secondary | ICD-10-CM | POA: Insufficient documentation

## 2021-12-28 DIAGNOSIS — I2510 Atherosclerotic heart disease of native coronary artery without angina pectoris: Secondary | ICD-10-CM | POA: Insufficient documentation

## 2021-12-28 DIAGNOSIS — Z7984 Long term (current) use of oral hypoglycemic drugs: Secondary | ICD-10-CM | POA: Insufficient documentation

## 2021-12-28 DIAGNOSIS — Z85828 Personal history of other malignant neoplasm of skin: Secondary | ICD-10-CM | POA: Insufficient documentation

## 2021-12-28 DIAGNOSIS — E119 Type 2 diabetes mellitus without complications: Secondary | ICD-10-CM | POA: Insufficient documentation

## 2021-12-28 NOTE — ED Provider Notes (Signed)
[facility] Provider Note    Event Date/Time   First MD Initiated Contact with Patient 12/28/21 [phone]     (approximate)   History   PICC line check   HPI  Luis [name]. is a 76 y.o. male brought to the ED via EMS from [facility] with a chief complaint of PICC line check.  Staff called EMS to disconnect a Luer-Lok which was screwed tightly to patient's PICC line which was used for IV antibiotics earlier in the evening.  EMS was able to unscrew the lower lock using hemostats.  Staff was then concerned that patient's PICC line had been displaced due to frequent manipulation and sent him to the ED for PICC line check.  Staff reported PICC line still functional.  Patient voices no complaints regarding PICC line.  He was seen in the ED earlier yesterday for paracentesis.     Past Medical History   Past Medical History:  Diagnosis Date   Angina pectoris ([place])    Anxiety    Arthritis    CHF (congestive heart failure) ([place])    Coronary artery disease    Depression    Diabetes mellitus without complication ([place])    Patient takes Metformin.   Diverticulosis    GERD (gastroesophageal reflux disease)    Hyperlipidemia    Hypertension    Multiple myeloma ([place])    Multiple myeloma ([place]) 03/27/2015   Myocardial infarction [facility]) April 2001   widowmaker   Shortness of breath dyspnea    Sleep apnea    No CPAP @ present   Spinal stenosis    Squamous cell carcinoma of skin 02/19/2014   Left dorsal hand. WD SCC with superficial infiltration. Re-shaved 04/23/2014. Re-shaved 09/05/2014.   Squamous cell carcinoma of skin 09/08/2020   Left ant scalp. WD SCC.   Squamous cell carcinoma of skin 02/04/2021   R hand dorsum, EDC      Active Problem List   Patient Active Problem List   Diagnosis Date Noted   AKI (acute kidney injury) ([place])    Pleural effusion    Aspiration into airway    Cirrhosis ([place])    Dysphagia    Ascites    Malnutrition of moderate  degree 12/10/2021   Sepsis ([place]) 12/09/2021   Cellulitis 12/09/2021   Hypokalemia 12/09/2021   Pancytopenia ([place]) 12/16/2020   HCAP (healthcare-associated pneumonia) 11/07/2020   Severe sepsis ([place]) 11/07/2020   HLD (hyperlipidemia) 11/07/2020   GERD (gastroesophageal reflux disease) 11/07/2020   Depression with anxiety 11/07/2020   Chronic diastolic CHF (congestive heart failure) ([place]) 11/07/2020   Elevated troponin 11/07/2020   Atrial fibrillation with RVR ([place]) 11/07/2020   T7 vertebral fracture ([place]) 11/07/2020   Aspiration pneumonia ([place]) 11/07/2020   Squamous cell skin cancer 09/18/2020   Scalp lesion 08/24/2020   Maintenance chemotherapy 06/14/2020   COVID-19 virus infection (vaccinated [place] 01/2020) 06/14/2020   COVID-19 06/14/2020   Weight loss 11/22/2019   Compression fracture of body of thoracic vertebra (HCC) 09/19/2019   Macrocytic anemia 03/25/2019   Renal insufficiency 03/25/2019   Abnormal iron saturation 02/18/2019   Bilateral carotid artery stenosis 03/21/2018   Connective tissue and disc stenosis of intervertebral foramina of lumbar region 02/06/2018   Hip strain, initial encounter 02/06/2018   History of total right hip replacement 02/06/2018   Obesity (BMI 30-39.9) 02/06/2018   Goals of care, counseling/discussion 11/18/2017   Drug-induced neutropenia ([place]) 01/23/2017   Hypocalcemia 06/28/2016   Type 2 diabetes mellitus  without complication, without long-term current use of insulin ([place]) 02/17/2016   Arthritis 05/01/2015   TI (tricuspid incompetence) 04/16/2015   Benign essential HTN 04/04/2015   B12 deficiency 03/31/2015   Diarrhea 03/31/2015   Multiple myeloma ([place]) 03/27/2015   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   MI (mitral incompetence) 09/17/2014   Obstructive apnea 09/17/2014   Neuritis or radiculitis due to rupture of lumbar intervertebral disc 08/27/2014   Spinal stenosis 08/27/2014   Degenerative arthritis of lumbar  spine 08/27/2014   Degenerative arthritis of hip 08/27/2014   Arthritis, degenerative 03/28/2014   Chest pain 03/14/2014   Generalized weakness 03/14/2014   Breath shortness 03/14/2014   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 01/11/2014   Combined fat and carbohydrate induced hyperlipemia 01/11/2014   Compulsive tobacco user syndrome 01/11/2014   CAD (coronary artery disease) of artery bypass graft 01/11/2014     Past Surgical History   Past Surgical History:  Procedure Laterality Date   CARDIAC CATHETERIZATION     CARPAL TUNNEL RELEASE     CATARACT EXTRACTION     COLONOSCOPY WITH PROPOFOL N/A 11/11/2015   Procedure: COLONOSCOPY WITH PROPOFOL;  Surgeon: Lollie Sails, MD;  Location: [facility] ENDOSCOPY;  Service: Endoscopy;  Laterality: N/A;   CORONARY ARTERY BYPASS GRAFT     EYE SURGERY Bilateral    Cataract Extraction   INGUINAL HERNIA REPAIR     JOINT REPLACEMENT Right 2008   Right Total Hip Replacement   PILONIDAL CYST EXCISION     ROTATOR CUFF REPAIR     TOTAL HIP ARTHROPLASTY Right    VENTRAL HERNIA REPAIR N/A 08/15/2015   Procedure: VENTRAL HERNIA REPAIR WITH MESH ;  Surgeon: Leonie Green, MD;  Location: [facility] ORS;  Service: General;  Laterality: N/A;     Home Medications   Prior to Admission medications   Medication Sig Start Date End Date Taking? Authorizing Provider  acetaminophen (TYLENOL) 325 MG tablet Take 2 tablets (650 mg total) by mouth every 6 (six) hours as needed for mild pain (or Fever >/= 101). 12/18/21   Lorella Nimrod, MD  ALPRAZolam Duanne Moron) 0.25 MG tablet Take 0.25 mg by mouth at bedtime as needed. for sleep 02/06/15   [provider]  aspirin 81 MG tablet Take 81 mg by mouth daily.    [provider]  Bee Pollen 500 MG CHEW Chew 1 tablet by mouth 2 (two) times daily.    [provider]  Calcium Carbonate-Vitamin D 600-400 MG-UNIT chew tablet Chew 1 tablet by mouth 2 (two) times daily.     [provider]  ceFAZolin (ANCEF) IVPB Inject 2 g into the vein every 8 (eight) hours for 15 days. Indication:  MSSA bacteremia with MSSA skin infection Last Day of Therapy:  12/31/2021 Labs - Once weekly:  CBC/D and CMP Please pull PIC at completion of IV antibiotics Fax weekly labs to [phone] 12/18/21 01/02/22  Lorella Nimrod, MD  feeding supplement (ENSURE ENLIVE / ENSURE PLUS) LIQD Take 237 mLs by mouth 2 (two) times daily between meals. 12/18/21   Lorella Nimrod, MD  glucose blood test strip USE ONCE DAILY. USE AS INSTRUCTED. DX E11.9 10/04/18   [provider]  ipratropium-albuterol (DUONEB) 0.5-2.5 (3) MG/3ML SOLN Take 3 mLs by nebulization every 6 (six) hours as needed. Patient not taking: Reported on 02/05/2021 11/13/20   Enzo Bi, MD  lidocaine (LIDODERM) 5 % Place 1 patch onto the skin every 12 (twelve) hours. Remove & Discard patch  within 12 hours or as directed by MD Patient not taking: Reported on 02/05/2021 01/13/21 01/13/22  Sable Feil, PA-C  loratadine (CLARITIN) 10 MG tablet Take 1 tablet (10 mg total) by mouth daily. 12/18/21   Lorella Nimrod, MD  lovastatin (MEVACOR) 40 MG tablet Take 40 mg by mouth at bedtime.     [provider]  metFORMIN (GLUCOPHAGE) 500 MG tablet Take 500 mg by mouth daily with breakfast.    [provider]  midodrine (PROAMATINE) 2.5 MG tablet Take 1 tablet (2.5 mg total) by mouth 3 (three) times daily with meals. 12/18/21   Lorella Nimrod, MD  Multiple Vitamin (MULTIVITAMIN WITH MINERALS) TABS tablet Take 1 tablet by mouth daily. 12/18/21   Lorella Nimrod, MD  Nutritional Supplements (,FEEDING SUPPLEMENT, PROSOURCE PLUS) liquid Take 30 mLs by mouth 3 (three) times daily between meals. 12/18/21   Lorella Nimrod, MD  omeprazole (PRILOSEC) 20 MG capsule Take 20 mg by mouth daily.    [provider]  PARoxetine (PAXIL) 10 MG tablet Take 10 mg by mouth daily.    [provider]  VENTOLIN HFA 108 (90 Base) MCG/ACT inhaler Inhale 2  puffs into the lungs every 6 (six) hours as needed for wheezing. 10/31/21   [provider]     Allergies  Pravastatin sodium, Statins, and Zocor [simvastatin]   Family History   Family History  Problem Relation Age of Onset   Heart disease Father    Stroke Mother    Prostate cancer Maternal Grandfather 52     Physical Exam  Triage Vital Signs: ED Triage Vitals [12/28/21 0137]  Enc Vitals Group     BP 100/78     Pulse Rate 93     Resp (!) 24     Temp 98.3 F (36.8 C)     Temp Source Oral     SpO2 94 %     Weight 191 lb 12.8 oz (87 kg)     Height _0  (1.676 m)     Head Circumference      Peak Flow      Pain Score 0     Pain Loc      Pain Edu?      Excl. in [place]?     Updated Vital Signs: BP 114/69 (BP Location: Left Arm)    Pulse 81    Temp 98.3 F (36.8 C) (Oral)    Resp 20    Ht _1  (1.676 m)    Wt 87 kg    SpO2 100%    BMI 30.96 kg/m    General: Awake, no distress.  CV:  RRR.  Good peripheral perfusion.  Resp:  Normal effort.  Abd:  Mild distention.  Other:  PICC line in place RUE.  Dressing is loose.   ED Results / Procedures / Treatments  Labs (all labs ordered are listed, but only abnormal results are displayed) Labs Reviewed - No data to display   EKG  None   RADIOLOGY I independently visualized and interpreted patient's chest x-ray as well as the radiology interpretation:  Chest x-ray: PICC line noted with distal tip overlying proximal portion of the SVC  Official radiology report(s): CT ABDOMEN PELVIS WO CONTRAST  Result Date: 12/27/2021 CLINICAL DATA:  Abdominal pain for 1 week, ascites, history of multiple myeloma EXAM: CT ABDOMEN AND PELVIS WITHOUT CONTRAST TECHNIQUE: Multidetector CT imaging of the abdomen and pelvis was performed following the standard protocol without IV contrast. RADIATION DOSE REDUCTION: This  exam was performed according to the departmental dose-optimization program which includes automated exposure  control, adjustment of the mA and/or kV according to patient size and/or use of iterative reconstruction technique. COMPARISON:  CT abdomen pelvis, 07/15/2015, CT lumbar spine, 11/07/2020 FINDINGS: Lower chest: Small, chronic, loculated appearing bilateral pleural effusion with associated round atelectasis and scarring. Coronary artery calcifications. Hepatobiliary: No solid liver abnormality is seen. Coarse, nodular cirrhotic morphology of the liver. Gallstone contracted in the gallbladder. No biliary dilatation. Pancreas: Unremarkable. No pancreatic ductal dilatation or surrounding inflammatory changes. Spleen: Normal in size without significant abnormality. Adrenals/Urinary Tract: Adrenal glands are unremarkable. Kidneys are normal, without renal calculi, solid lesion, or hydronephrosis. Bladder is unremarkable. Stomach/Bowel: Stomach is within normal limits. Appendix is not clearly visualized. Wall thickening and mural fatty stratification of the cecum and proximal transverse colon (series 2, image 55). Descending and sigmoid diverticulosis. Vascular/Lymphatic: Aortic atherosclerosis. No enlarged abdominal or pelvic lymph nodes. Reproductive: No mass or other significant abnormality. Other: Large, ascites fluid containing right-sided spigelian hernia (series 2, image 70). Small, fat and fluid containing bilateral inguinal hernias. Large volume ascites throughout the abdomen and pelvis. Anasarca. Musculoskeletal: No acute osseous findings. Unchanged wedge deformity of the T11 vertebral body. Numerous lytic lesions throughout the spine and pelvis (series 6, image 85, series 2, image 63). Status post right hip total arthroplasty. IMPRESSION: 1. Large volume ascites throughout the abdomen and pelvis. 2. Cirrhosis. 3. Wall thickening and mural fatty stratification of the cecum and proximal transverse colon, likely portal colopathy in the setting of ascites and portal hypertension. 4. Gallstones contracted in the  gallbladder. 5. Numerous lytic lesions throughout the spine and pelvis, consistent with history of multiple myeloma and unchanged compared to prior imaging of the lumbar spine dated 11/07/2020. 6. Coronary artery disease. Aortic Atherosclerosis (ICD10-I70.0). Electronically Signed   By: Delanna Ahmadi M.D.   On: 12/27/2021 16:11   DG Chest 1 View  Result Date: 12/28/2021 CLINICAL DATA:  Check PICC line placement. EXAM: CHEST  1 VIEW COMPARISON:  December 14, 2021 FINDINGS: Multiple sternal wires are seen. A right-sided PICC line is noted with its distal tip overlying the proximal to midportion of the superior vena cava. This is at the level of the aortic arch. Mild, chronic appearing increased lung markings are seen with ill-defined areas of bilateral scarring and/or atelectasis. Left apical pleural thickening is noted. There is no evidence of a pleural effusion or pneumothorax. The heart size and mediastinal contours are within normal limits. Marked severity calcification of the aortic arch is seen. There are several chronic left-sided rib fractures. Multilevel degenerative changes are noted throughout the thoracic spine. IMPRESSION: Right-sided PICC line positioning, as described above Electronically Signed   By: Virgina Norfolk M.D.   On: 12/28/2021 02:24     PROCEDURES:  Critical Care performed: No  Procedures   MEDICATIONS ORDERED IN ED: Medications - No data to display   IMPRESSION / MDM / [place] / ED COURSE  I reviewed the triage vital signs and the nursing notes.                             76 year old male sent to the ED from SNF for PICC line check.  Chest x-ray demonstrates tip of PICC line overlying SVC.  Patient voices no complaints currently.  Will discharge back to nursing facility. Strict return precautions given.  Patient verbalizes understanding and agrees with plan of care.  FINAL  CLINICAL IMPRESSION(S) / ED DIAGNOSES   Final diagnoses:  Status post  peripherally inserted central catheter (PICC) central line placement     Rx / DC Orders   ED Discharge Orders     None        Note:  This document was prepared using Dragon voice recognition software and may include unintentional dictation errors.   Paulette Blanch, MD 12/28/21 [phone]

## 2021-12-28 NOTE — ED Notes (Signed)
[facility] called for transport to [facility] 

## 2021-12-28 NOTE — ED Triage Notes (Signed)
Pt from [facility]. Per ems staff at [facility] requesting PICC line check. Per ems staff was not able to disconnect iv antibiotics from luer lock tonight and were manipulating line. Staff per ems was concerned line has become displaced.

## 2021-12-28 NOTE — Discharge Instructions (Signed)
Return to the ER for worsening symptoms, persistent vomiting, difficulty breathing or other concerns. °

## 2021-12-30 ENCOUNTER — Other Ambulatory Visit: Payer: Self-pay

## 2021-12-30 ENCOUNTER — Encounter: Payer: Self-pay | Admitting: Emergency Medicine

## 2021-12-30 ENCOUNTER — Emergency Department
Admission: EM | Admit: 2021-12-30 | Discharge: 2021-12-30 | Disposition: A | Discharge status: Home / Self Care | Payer: Medicare Other | Attending: Emergency Medicine | Admitting: Emergency Medicine

## 2021-12-30 DIAGNOSIS — E119 Type 2 diabetes mellitus without complications: Secondary | ICD-10-CM | POA: Diagnosis not present

## 2021-12-30 DIAGNOSIS — Z96641 Presence of right artificial hip joint: Secondary | ICD-10-CM | POA: Diagnosis not present

## 2021-12-30 DIAGNOSIS — I110 Hypertensive heart disease with heart failure: Secondary | ICD-10-CM | POA: Insufficient documentation

## 2021-12-30 DIAGNOSIS — I2510 Atherosclerotic heart disease of native coronary artery without angina pectoris: Secondary | ICD-10-CM | POA: Insufficient documentation

## 2021-12-30 DIAGNOSIS — Z8616 Personal history of COVID-19: Secondary | ICD-10-CM | POA: Diagnosis not present

## 2021-12-30 DIAGNOSIS — R140 Abdominal distension (gaseous): Secondary | ICD-10-CM | POA: Insufficient documentation

## 2021-12-30 DIAGNOSIS — R188 Other ascites: Secondary | ICD-10-CM | POA: Insufficient documentation

## 2021-12-30 DIAGNOSIS — I5032 Chronic diastolic (congestive) heart failure: Secondary | ICD-10-CM | POA: Insufficient documentation

## 2021-12-30 DIAGNOSIS — Z85828 Personal history of other malignant neoplasm of skin: Secondary | ICD-10-CM | POA: Insufficient documentation

## 2021-12-30 LAB — COMPREHENSIVE METABOLIC PANEL
ALT: <5 U/L (ref 0–44)
AST: 39 U/L (ref 15–41)
Albumin: 2 g/dL — ABNORMAL LOW (ref 3.5–5.0)
Alkaline Phosphatase: 70 U/L (ref 38–126)
Anion gap: 11 (ref 5–15)
BUN: 57 mg/dL — ABNORMAL HIGH (ref 8–23)
CO2: 19 mmol/L — ABNORMAL LOW (ref 22–32)
Calcium: 7.7 mg/dL — ABNORMAL LOW (ref 8.9–10.3)
Chloride: 103 mmol/L (ref 98–111)
Creatinine, Ser: 2.46 mg/dL — ABNORMAL HIGH (ref 0.61–1.24)
GFR, Estimated: 26 mL/min — ABNORMAL LOW (ref >60)
Glucose, Bld: 131 mg/dL — ABNORMAL HIGH (ref 70–99)
Potassium: 3.1 mmol/L — ABNORMAL LOW (ref 3.5–5.1)
Sodium: 133 mmol/L — ABNORMAL LOW (ref 135–145)
Total Bilirubin: 1.2 mg/dL (ref 0.3–1.2)
Total Protein: 6.5 g/dL (ref 6.5–8.1)

## 2021-12-30 LAB — CBC WITH DIFFERENTIAL/PLATELET
Abs Immature Granulocytes: 0.03 10*3/uL (ref 0.00–0.07)
Basophils Absolute: 0.1 10*3/uL (ref 0.0–0.1)
Basophils Relative: 1 %
Eosinophils Absolute: 0.3 10*3/uL (ref 0.0–0.5)
Eosinophils Relative: 6 %
HCT: 29.1 % — ABNORMAL LOW (ref 39.0–52.0)
Hemoglobin: 9.3 g/dL — ABNORMAL LOW (ref 13.0–17.0)
Immature Granulocytes: 1 %
Lymphocytes Relative: 17 %
Lymphs Abs: 0.8 10*3/uL (ref 0.7–4.0)
MCH: 37.5 pg — ABNORMAL HIGH (ref 26.0–34.0)
MCHC: 32 g/dL (ref 30.0–36.0)
MCV: 117.3 fL — ABNORMAL HIGH (ref 80.0–100.0)
Monocytes Absolute: 0.8 10*3/uL (ref 0.1–1.0)
Monocytes Relative: 17 %
Neutro Abs: 3 10*3/uL (ref 1.7–7.7)
Neutrophils Relative %: 58 %
Platelets: 194 10*3/uL (ref 150–400)
RBC: 2.48 MIL/uL — ABNORMAL LOW (ref 4.22–5.81)
RDW: 17.8 % — ABNORMAL HIGH (ref 11.5–15.5)
Smear Review: NORMAL
WBC: 5 10*3/uL (ref 4.0–10.5)
nRBC: 0 % (ref 0.0–0.2)

## 2021-12-30 LAB — PROTIME-INR
INR: 1.4 — ABNORMAL HIGH (ref 0.8–1.2)
Prothrombin Time: 17.1 seconds — ABNORMAL HIGH (ref 11.4–15.2)

## 2021-12-30 MED ORDER — FUROSEMIDE 10 MG/ML IJ SOLN
40.0000 mg | Freq: Once | INTRAMUSCULAR | Status: AC
  Administered 2021-12-30: 40 mg via INTRAVENOUS
  Filled 2021-12-30: qty 4

## 2021-12-30 MED ORDER — POTASSIUM CHLORIDE CRYS ER 20 MEQ PO TBCR
40.0000 meq | EXTENDED_RELEASE_TABLET | Freq: Once | ORAL | Status: AC
Start: 2021-12-30 — End: 2021-12-30
  Administered 2021-12-30: 40 meq via ORAL
  Filled 2021-12-30: qty 2

## 2021-12-30 NOTE — ED Triage Notes (Addendum)
Pt comes into the ED via [facility] from [facility] c/o ascites.  Pt just had 3.1L drained from a paracentesis on Saturday.  Pt wears 2L chronically.  Pt denies any symptoms of SHOB or anything else, but admits to some abd discomfort.   96% @L  80 Hr 131/59 97.9 oral 166 CBG

## 2021-12-30 NOTE — ED Notes (Signed)
Paracentesis yielded 1800 cc of clear yellow fluid

## 2021-12-30 NOTE — ED Provider Notes (Signed)
[facility] Provider Note    Event Date/Time   First MD Initiated Contact with Patient 12/30/21 1054     (approximate)   History   Ascites   HPI  Luis [name]. is a 76 y.o. male with past medical history of cirrhosis, coronary artery disease, GERD, hypertension, hyperlipidemia who presents with abdominal distention.  Patient was admitted about 2 weeks ago for sepsis found to have MSSA bacteremia.  He was treated for ascites had a large-volume paracentesis with about 2 L of ascites removed.  There is no evidence of SBP.  He return to the ED days ago for abdominal distention and had another large-volume paracentesis performed.  Patient says that he was told that if the abdomen starting to get tight again that he should come because it should not get to as bad as it was before.  Patient does endorse some generalized abdominal swelling and tightness denies the pain.  Has no nausea vomiting but has decreased appetite but this has been for a while.  Denies fevers or chills.  Breathing is at its baseline.    Past Medical History:  Diagnosis Date   Angina pectoris ([place])    Anxiety    Arthritis    CHF (congestive heart failure) ([place])    Coronary artery disease    Depression    Diabetes mellitus without complication ([place])    Patient takes Metformin.   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 WD SCC with superficial infiltration. Re-shaved 04/23/2014. Re-shaved 09/05/2014.   Squamous cell carcinoma of skin 09/08/2020   Left ant scalp. WD SCC.   Squamous cell carcinoma of skin 02/04/2021   R hand dorsum, EDC     Patient Active Problem List    Diagnosis Date Noted   AKI (acute kidney injury) ([place])    Pleural effusion    Aspiration into airway    Cirrhosis ([place])    Dysphagia    Ascites    Malnutrition of moderate degree 12/10/2021   Sepsis ([place]) 12/09/2021   Cellulitis 12/09/2021   Hypokalemia 12/09/2021   Pancytopenia ([place]) 12/16/2020   HCAP (healthcare-associated pneumonia) 11/07/2020   Severe sepsis ([place]) 11/07/2020   HLD (hyperlipidemia) 11/07/2020   GERD (gastroesophageal reflux disease) 11/07/2020   Depression with anxiety 11/07/2020   Chronic diastolic CHF (congestive heart failure) ([place]) 11/07/2020   Elevated troponin 11/07/2020   Atrial fibrillation with RVR ([place]) 11/07/2020   T7 vertebral fracture ([place]) 11/07/2020   Aspiration pneumonia ([place]) 11/07/2020   Squamous cell skin cancer 09/18/2020   Scalp lesion 08/24/2020   Maintenance chemotherapy 06/14/2020   COVID-19 virus infection (vaccinated [place] 01/2020) 06/14/2020   COVID-19 06/14/2020   Weight loss 11/22/2019   Compression fracture of body of thoracic vertebra (HCC) 09/19/2019   Macrocytic anemia 03/25/2019   Renal insufficiency 03/25/2019   Abnormal iron saturation 02/18/2019   Bilateral carotid artery stenosis 03/21/2018   Connective tissue and disc stenosis of intervertebral foramina of lumbar region 02/06/2018   Hip strain, initial encounter 02/06/2018   History of total right hip replacement 02/06/2018   Obesity (BMI  30-39.9) 02/06/2018   Goals of care, counseling/discussion 11/18/2017   Drug-induced neutropenia ([place]) 01/23/2017   Hypocalcemia 06/28/2016   Type 2 diabetes mellitus without complication, without long-term current use of insulin (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   MI (mitral incompetence) 09/17/2014    Obstructive apnea 09/17/2014   Neuritis or radiculitis due to rupture of lumbar intervertebral disc 08/27/2014   Spinal stenosis 08/27/2014   Degenerative arthritis of lumbar spine 08/27/2014   Degenerative arthritis of hip 08/27/2014   Arthritis, degenerative 03/28/2014   Chest pain 03/14/2014   Generalized weakness 03/14/2014   Breath shortness 03/14/2014   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 01/11/2014   Combined fat and carbohydrate induced hyperlipemia 01/11/2014   Compulsive tobacco user syndrome 01/11/2014   CAD (coronary artery disease) of artery bypass graft 01/11/2014     Physical Exam  Triage Vital Signs: ED Triage Vitals  Enc Vitals Group     BP 12/30/21 1058 131/69     Pulse Rate 12/30/21 1058 81     Resp 12/30/21 1058 (!) 22     Temp 12/30/21 1058 98.3 F (36.8 C)     Temp Source 12/30/21 1058 Oral     SpO2 12/30/21 1058 95 %     Weight 12/30/21 1059 191 lb 12.8 oz (87 kg)     Height 12/30/21 1059 _0  (1.676 m)     Head Circumference --      Peak Flow --      Pain Score 12/30/21 1059 4     Pain Loc --      Pain Edu? --      Excl. in [place]? --     Most recent vital signs: Vitals:   12/30/21 1058  BP: 131/69  Pulse: 81  Resp: (!) 22  Temp: 98.3 F (36.8 C)  SpO2: 95%     General: Awake, no distress.  Patient appears chronically ill CV:  Good peripheral perfusion.  Bilateral venous stasis changes Resp:  Normal effort.  Abd:  No distention.  Abdomen is distended, nontender throughout Neuro:             Awake, Alert, Oriented x 3  Other:     ED Results / Procedures / Treatments  Labs (all labs ordered are listed, but only abnormal results are displayed) Labs Reviewed  CBC WITH DIFFERENTIAL/PLATELET - Abnormal; Notable for the following components:      Result Value   RBC 2.48 (*)    Hemoglobin 9.3 (*)    HCT 29.1 (*)    MCV 117.3 (*)    MCH 37.5 (*)    RDW 17.8 (*)    All other components within normal limits  COMPREHENSIVE  METABOLIC PANEL - Abnormal; Notable for the following components:   Sodium 133 (*)    Potassium 3.1 (*)    CO2 19 (*)    Glucose, Bld 131 (*)    BUN 57 (*)    Creatinine, Ser 2.46 (*)    Calcium 7.7 (*)    Albumin 2.0 (*)    GFR, Estimated 26 (*)    All other components within normal limits  PROTIME-INR - Abnormal; Notable for the following components:   Prothrombin Time 17.1 (*)    INR 1.4 (*)    All  other components within normal limits     EKG     RADIOLOGY Bedside ultrasound of the abdomen before myself which shows ascites   PROCEDURES:  Critical Care performed: No  .Paracentesis  Date/Time: 12/30/2021 1:09 PM Performed by: Rada Hay, MD Authorized by: Rada Hay, MD   Consent:    Consent obtained:  Verbal   Risks discussed:  Bleeding, infection and pain   Alternatives discussed:  No treatment Universal protocol:    Patient identity confirmed:  Verbally with patient Anesthesia:    Anesthesia method:  Local infiltration   Local anesthetic:  Lidocaine 1% w/o epi Procedure details:    Needle gauge:  18   Ultrasound guidance: no     Puncture site:  R lower quadrant   Fluid removed amount:  1.8L   Fluid appearance:  Serous   Dressing:  4x4 sterile gauze Post-procedure details:    Procedure completion:  Tolerated  The patient is on the cardiac monitor to evaluate for evidence of arrhythmia and/or significant heart rate changes.   MEDICATIONS ORDERED IN ED: Medications  furosemide (LASIX) injection 40 mg (40 mg Intravenous Given 12/30/21 1124)  potassium chloride SA (KLOR-CON M) CR tablet 40 mEq (40 mEq Oral Given 12/30/21 1216)     IMPRESSION / MDM / ASSESSMENT AND PLAN / ED COURSE  I reviewed the triage vital signs and the nursing notes.                              Differential diagnosis includes, but is not limited to, cirrhosis with recurrent ascites.  This patient is a 76 year old male with past medical history of ascites and  CHF who presents with abdominal swelling.  Patient seen just 3 days ago in the ED and had large-volume paracentesis with about 3 L of ascitic fluid removed.  Prior to that had been hospitalized for sepsis and had a 2 L large-volume paracentesis as well.  Presents today because he was told that he should not let his abdomen get as tense as it was before.  He has no increased respiratory difficulty no abdominal pain fevers.  Presentation is not suggestive of SBP.  His abdomen is benign although is distended.  On ultrasound he does have ascites.  Labs are near his baseline.  His hemoglobin has down trended over the last several weeks is 9.3 from 9.8 just 3 days ago.  Not significantly changed.  Platelets are normal.  Creatinine is near baseline.  Potassium mildly low at 3.1 which is supplemented.  Large-volume paracentesis was performed with removal of 1.8 L of ascitic fluid, ascites stopped draining at this point on its own.  Fluid was serous.  Patient advised that he needs to follow-up with hepatology as he should not be coming to the ED for repeat paracenteses this should be done as an outpatient.  Patient did not want to be admitted to the hospital I think this is reasonable given he is otherwise at his baseline.     FINAL CLINICAL IMPRESSION(S) / ED DIAGNOSES   Final diagnoses:  Other ascites     Rx / DC Orders   ED Discharge Orders     None        Note:  This document was prepared using Dragon voice recognition software and may include unintentional dictation errors.   Rada Hay, MD 12/30/21 1311

## 2021-12-30 NOTE — ED Notes (Signed)
Report called to [facility] at [facility].  EMS called for transport.

## 2021-12-30 NOTE — Discharge Instructions (Signed)
The patient had 2 L of ascites fluid drained today and was given a dose of IV Lasix.  He should follow-up with a liver specialist or hepatology for further management of his ascites.

## 2021-12-31 ENCOUNTER — Telehealth: Payer: Self-pay | Admitting: Infectious Diseases

## 2021-12-31 ENCOUNTER — Inpatient Hospital Stay: Payer: Medicare Other | Admitting: Infectious Diseases

## 2021-12-31 NOTE — Telephone Encounter (Signed)
Pt was scheduled to day to see me but did not show up for the visit. Tried to call him no answer  [facility] is in [facility] and I called the nurse and spoke to [place]  She says pt was sent for the visit but he went to a different clinic apparently PT is being treated for MSSA bacteremia coming from skin wounds He is finishing a total of 4 weeks of IV cefazolin today 12/31/21 Labs done  on 12/28/21 shows  HB 10/ MCV 119 Cr 2.79  Pt can have the PICC line removed on 01/01/22 Informed Daphne

## 2021-12-31 NOTE — Progress Notes (Unsigned)
Pt was scheduled to day to see me but did not show up for the visit [facility] is in [facility] and I called the nurse and spoke to [facility]  She says pt was sent for the visit but he went to a different clinic apparently PT is being treated for MSSA bacteremia coming from skin wounds He is finishing a total of 4 weeks of IV cefazolin today 12/31/21 Labs done  on 12/28/21 shows  HB 10/ MCV 119 Cr 2.79  Pt can have the PICC line removed on 01/01/22 Informed Daphne

## 2022-01-08 ENCOUNTER — Inpatient Hospital Stay
Admission: EM | Admit: 2022-01-08 | Discharge: 2022-01-14 | DRG: 441 | Disposition: A | Discharge status: Skilled Nursing Facility | Payer: Medicare Other | Source: Skilled Nursing Facility | Attending: Internal Medicine | Admitting: Internal Medicine

## 2022-01-08 ENCOUNTER — Inpatient Hospital Stay: Payer: Medicare Other

## 2022-01-08 ENCOUNTER — Other Ambulatory Visit: Payer: Self-pay

## 2022-01-08 ENCOUNTER — Emergency Department: Payer: Medicare Other

## 2022-01-08 DIAGNOSIS — K7682 Hepatic encephalopathy: Secondary | ICD-10-CM | POA: Diagnosis present

## 2022-01-08 DIAGNOSIS — Z87891 Personal history of nicotine dependence: Secondary | ICD-10-CM | POA: Diagnosis not present

## 2022-01-08 DIAGNOSIS — K7031 Alcoholic cirrhosis of liver with ascites: Secondary | ICD-10-CM | POA: Diagnosis present

## 2022-01-08 DIAGNOSIS — F1010 Alcohol abuse, uncomplicated: Secondary | ICD-10-CM | POA: Diagnosis present

## 2022-01-08 DIAGNOSIS — L8990 Pressure ulcer of unspecified site, unspecified stage: Secondary | ICD-10-CM | POA: Diagnosis present

## 2022-01-08 DIAGNOSIS — N1832 Chronic kidney disease, stage 3b: Secondary | ICD-10-CM | POA: Diagnosis present

## 2022-01-08 DIAGNOSIS — D6489 Other specified anemias: Secondary | ICD-10-CM | POA: Diagnosis present

## 2022-01-08 DIAGNOSIS — R5381 Other malaise: Secondary | ICD-10-CM | POA: Diagnosis present

## 2022-01-08 DIAGNOSIS — L89321 Pressure ulcer of left buttock, stage 1: Secondary | ICD-10-CM | POA: Diagnosis present

## 2022-01-08 DIAGNOSIS — E669 Obesity, unspecified: Secondary | ICD-10-CM | POA: Diagnosis present

## 2022-01-08 DIAGNOSIS — K7460 Unspecified cirrhosis of liver: Secondary | ICD-10-CM | POA: Diagnosis present

## 2022-01-08 DIAGNOSIS — C9000 Multiple myeloma not having achieved remission: Secondary | ICD-10-CM | POA: Diagnosis present

## 2022-01-08 DIAGNOSIS — G9341 Metabolic encephalopathy: Secondary | ICD-10-CM | POA: Diagnosis present

## 2022-01-08 DIAGNOSIS — I130 Hypertensive heart and chronic kidney disease with heart failure and stage 1 through stage 4 chronic kidney disease, or unspecified chronic kidney disease: Secondary | ICD-10-CM | POA: Diagnosis present

## 2022-01-08 DIAGNOSIS — R531 Weakness: Secondary | ICD-10-CM

## 2022-01-08 DIAGNOSIS — Z66 Do not resuscitate: Secondary | ICD-10-CM | POA: Diagnosis present

## 2022-01-08 DIAGNOSIS — Z20822 Contact with and (suspected) exposure to covid-19: Secondary | ICD-10-CM | POA: Diagnosis present

## 2022-01-08 DIAGNOSIS — I2510 Atherosclerotic heart disease of native coronary artery without angina pectoris: Secondary | ICD-10-CM | POA: Diagnosis present

## 2022-01-08 DIAGNOSIS — Z7982 Long term (current) use of aspirin: Secondary | ICD-10-CM

## 2022-01-08 DIAGNOSIS — F418 Other specified anxiety disorders: Secondary | ICD-10-CM | POA: Diagnosis present

## 2022-01-08 DIAGNOSIS — I25810 Atherosclerosis of coronary artery bypass graft(s) without angina pectoris: Secondary | ICD-10-CM | POA: Diagnosis present

## 2022-01-08 DIAGNOSIS — Z7984 Long term (current) use of oral hypoglycemic drugs: Secondary | ICD-10-CM

## 2022-01-08 DIAGNOSIS — E872 Acidosis, unspecified: Secondary | ICD-10-CM | POA: Diagnosis present

## 2022-01-08 DIAGNOSIS — Z794 Long term (current) use of insulin: Secondary | ICD-10-CM

## 2022-01-08 DIAGNOSIS — E785 Hyperlipidemia, unspecified: Secondary | ICD-10-CM | POA: Diagnosis present

## 2022-01-08 DIAGNOSIS — Z85828 Personal history of other malignant neoplasm of skin: Secondary | ICD-10-CM

## 2022-01-08 DIAGNOSIS — L89311 Pressure ulcer of right buttock, stage 1: Secondary | ICD-10-CM | POA: Diagnosis present

## 2022-01-08 DIAGNOSIS — E1122 Type 2 diabetes mellitus with diabetic chronic kidney disease: Secondary | ICD-10-CM | POA: Diagnosis present

## 2022-01-08 DIAGNOSIS — Z515 Encounter for palliative care: Secondary | ICD-10-CM | POA: Diagnosis not present

## 2022-01-08 DIAGNOSIS — K7040 Alcoholic hepatic failure without coma: Secondary | ICD-10-CM | POA: Diagnosis present

## 2022-01-08 DIAGNOSIS — I5032 Chronic diastolic (congestive) heart failure: Secondary | ICD-10-CM | POA: Diagnosis present

## 2022-01-08 DIAGNOSIS — Z96641 Presence of right artificial hip joint: Secondary | ICD-10-CM | POA: Diagnosis present

## 2022-01-08 DIAGNOSIS — I959 Hypotension, unspecified: Secondary | ICD-10-CM | POA: Diagnosis present

## 2022-01-08 DIAGNOSIS — I252 Old myocardial infarction: Secondary | ICD-10-CM

## 2022-01-08 DIAGNOSIS — E119 Type 2 diabetes mellitus without complications: Secondary | ICD-10-CM

## 2022-01-08 DIAGNOSIS — Z6830 Body mass index (BMI) 30.0-30.9, adult: Secondary | ICD-10-CM

## 2022-01-08 DIAGNOSIS — K219 Gastro-esophageal reflux disease without esophagitis: Secondary | ICD-10-CM | POA: Diagnosis present

## 2022-01-08 DIAGNOSIS — Z823 Family history of stroke: Secondary | ICD-10-CM

## 2022-01-08 DIAGNOSIS — Z79899 Other long term (current) drug therapy: Secondary | ICD-10-CM

## 2022-01-08 DIAGNOSIS — Z8042 Family history of malignant neoplasm of prostate: Secondary | ICD-10-CM

## 2022-01-08 DIAGNOSIS — R188 Other ascites: Secondary | ICD-10-CM

## 2022-01-08 DIAGNOSIS — Z8249 Family history of ischemic heart disease and other diseases of the circulatory system: Secondary | ICD-10-CM

## 2022-01-08 LAB — CBC WITH DIFFERENTIAL/PLATELET
Abs Immature Granulocytes: 0.04 10*3/uL (ref 0.00–0.07)
Basophils Absolute: 0.1 10*3/uL (ref 0.0–0.1)
Basophils Relative: 1 %
Eosinophils Absolute: 0.1 10*3/uL (ref 0.0–0.5)
Eosinophils Relative: 3 %
HCT: 31.4 % — ABNORMAL LOW (ref 39.0–52.0)
Hemoglobin: 10.2 g/dL — ABNORMAL LOW (ref 13.0–17.0)
Immature Granulocytes: 1 %
Lymphocytes Relative: 16 %
Lymphs Abs: 0.8 10*3/uL (ref 0.7–4.0)
MCH: 37.5 pg — ABNORMAL HIGH (ref 26.0–34.0)
MCHC: 32.5 g/dL (ref 30.0–36.0)
MCV: 115.4 fL — ABNORMAL HIGH (ref 80.0–100.0)
Monocytes Absolute: 0.6 10*3/uL (ref 0.1–1.0)
Monocytes Relative: 14 %
Neutro Abs: 3 10*3/uL (ref 1.7–7.7)
Neutrophils Relative %: 65 %
Platelets: 178 10*3/uL (ref 150–400)
RBC: 2.72 MIL/uL — ABNORMAL LOW (ref 4.22–5.81)
RDW: 16.8 % — ABNORMAL HIGH (ref 11.5–15.5)
Smear Review: NORMAL
WBC: 4.6 10*3/uL (ref 4.0–10.5)
nRBC: 0 % (ref 0.0–0.2)

## 2022-01-08 LAB — BLOOD GAS, VENOUS
Acid-Base Excess: 4.4 mmol/L — ABNORMAL HIGH (ref 0.0–2.0)
Bicarbonate: 29.2 mmol/L — ABNORMAL HIGH (ref 20.0–28.0)
O2 Saturation: 45.1 %
Patient temperature: 37
pCO2, Ven: 43 mmHg — ABNORMAL LOW (ref 44–60)
pH, Ven: 7.44 — ABNORMAL HIGH (ref 7.25–7.43)
pO2, Ven: 32 mmHg (ref 32–45)

## 2022-01-08 LAB — COMPREHENSIVE METABOLIC PANEL
ALT: 5 U/L (ref 0–44)
AST: 58 U/L — ABNORMAL HIGH (ref 15–41)
Albumin: 2.6 g/dL — ABNORMAL LOW (ref 3.5–5.0)
Alkaline Phosphatase: 61 U/L (ref 38–126)
Anion gap: 11 (ref 5–15)
BUN: 49 mg/dL — ABNORMAL HIGH (ref 8–23)
CO2: 24 mmol/L (ref 22–32)
Calcium: 8.3 mg/dL — ABNORMAL LOW (ref 8.9–10.3)
Chloride: 106 mmol/L (ref 98–111)
Creatinine, Ser: 2.03 mg/dL — ABNORMAL HIGH (ref 0.61–1.24)
GFR, Estimated: 33 mL/min — ABNORMAL LOW (ref >60)
Glucose, Bld: 91 mg/dL (ref 70–99)
Potassium: 4 mmol/L (ref 3.5–5.1)
Sodium: 141 mmol/L (ref 135–145)
Total Bilirubin: 2.4 mg/dL — ABNORMAL HIGH (ref 0.3–1.2)
Total Protein: 8.1 g/dL (ref 6.5–8.1)

## 2022-01-08 LAB — URINALYSIS, COMPLETE (UACMP) WITH MICROSCOPIC
Bilirubin Urine: NEGATIVE
Glucose, UA: NEGATIVE mg/dL
Hgb urine dipstick: NEGATIVE
Ketones, ur: NEGATIVE mg/dL
Leukocytes,Ua: NEGATIVE
Nitrite: NEGATIVE
Protein, ur: NEGATIVE mg/dL
Specific Gravity, Urine: 1.011 (ref 1.005–1.030)
Squamous Epithelial / HPF: NONE SEEN (ref 0–5)
pH: 5 (ref 5.0–8.0)

## 2022-01-08 LAB — PROTIME-INR
INR: 1.3 — ABNORMAL HIGH (ref 0.8–1.2)
Prothrombin Time: 15.8 seconds — ABNORMAL HIGH (ref 11.4–15.2)

## 2022-01-08 LAB — APTT: aPTT: 34 seconds (ref 24–36)

## 2022-01-08 LAB — RESP PANEL BY RT-PCR (FLU A&B, COVID) ARPGX2
Influenza A by PCR: NEGATIVE
Influenza B by PCR: NEGATIVE
SARS Coronavirus 2 by RT PCR: NEGATIVE

## 2022-01-08 LAB — LACTIC ACID, PLASMA
Lactic Acid, Venous: 2.5 mmol/L (ref 0.5–1.9)
Lactic Acid, Venous: 2.1 mmol/L (ref 0.5–1.9)

## 2022-01-08 LAB — AMMONIA: Ammonia: 47 umol/L — ABNORMAL HIGH (ref 9–35)

## 2022-01-08 MED ORDER — SODIUM CHLORIDE 0.9% FLUSH: 3.0000 mL | INTRAVENOUS | Status: DC | PRN

## 2022-01-08 MED ORDER — ACETAMINOPHEN 325 MG PO TABS
650.0000 mg | ORAL_TABLET | Freq: Four times a day (QID) | ORAL | Status: DC | PRN
  Administered 2022-01-10 – 2022-01-13 (×2): 650 mg via ORAL
  Filled 2022-01-08 (×2): qty 2

## 2022-01-08 MED ORDER — SODIUM CHLORIDE 0.9 % IV SOLN
2.0000 g | INTRAVENOUS | Status: DC
  Administered 2022-01-08 – 2022-01-11 (×4): 2 g via INTRAVENOUS
  Filled 2022-01-08 (×2): qty 2
  Filled 2022-01-08 (×2): qty 20
  Filled 2022-01-08: qty 2

## 2022-01-08 MED ORDER — PROSOURCE PLUS PO LIQD
30.0000 mL | Freq: Three times a day (TID) | ORAL | Status: DC
  Administered 2022-01-08: 30 mL via ORAL
  Filled 2022-01-08 (×9): qty 30

## 2022-01-08 MED ORDER — ENOXAPARIN SODIUM 30 MG/0.3ML IJ SOSY: 30.0000 mg | PREFILLED_SYRINGE | INTRAMUSCULAR | Status: DC

## 2022-01-08 MED ORDER — ALPRAZOLAM 0.25 MG PO TABS
0.2500 mg | ORAL_TABLET | Freq: Every evening | ORAL | Status: DC | PRN
  Administered 2022-01-09 – 2022-01-10 (×2): 0.25 mg via ORAL
  Filled 2022-01-08 (×3): qty 1

## 2022-01-08 MED ORDER — LACTULOSE 10 GM/15ML PO SOLN
30.0000 g | Freq: Three times a day (TID) | ORAL | Status: DC
  Administered 2022-01-09 – 2022-01-13 (×10): 30 g via ORAL
  Filled 2022-01-08 (×12): qty 60

## 2022-01-08 MED ORDER — PANTOPRAZOLE SODIUM 40 MG PO TBEC
40.0000 mg | DELAYED_RELEASE_TABLET | Freq: Every day | ORAL | Status: DC
  Administered 2022-01-08 – 2022-01-14 (×7): 40 mg via ORAL
  Filled 2022-01-08 (×7): qty 1

## 2022-01-08 MED ORDER — MIDODRINE HCL 5 MG PO TABS
2.5000 mg | ORAL_TABLET | Freq: Three times a day (TID) | ORAL | Status: DC
  Administered 2022-01-08 – 2022-01-11 (×8): 2.5 mg via ORAL
  Filled 2022-01-08 (×8): qty 1

## 2022-01-08 MED ORDER — ASPIRIN EC 81 MG PO TBEC
81.0000 mg | DELAYED_RELEASE_TABLET | Freq: Every day | ORAL | Status: DC
  Administered 2022-01-08 – 2022-01-13 (×6): 81 mg via ORAL
  Filled 2022-01-08 (×6): qty 1

## 2022-01-08 MED ORDER — PAROXETINE HCL 10 MG PO TABS
10.0000 mg | ORAL_TABLET | Freq: Every day | ORAL | Status: DC
  Administered 2022-01-08 – 2022-01-14 (×7): 10 mg via ORAL
  Filled 2022-01-08 (×8): qty 1

## 2022-01-08 MED ORDER — ONDANSETRON HCL 4 MG PO TABS: 4.0000 mg | ORAL_TABLET | Freq: Four times a day (QID) | ORAL | Status: DC | PRN

## 2022-01-08 MED ORDER — LACTULOSE 10 GM/15ML PO SOLN
30.0000 g | Freq: Once | ORAL | Status: AC
  Administered 2022-01-08: 30 g via ORAL
  Filled 2022-01-08: qty 60

## 2022-01-08 MED ORDER — ONDANSETRON HCL 4 MG/2ML IJ SOLN: 4.0000 mg | Freq: Four times a day (QID) | INTRAMUSCULAR | Status: DC | PRN

## 2022-01-08 MED ORDER — SODIUM CHLORIDE 0.9 % IV BOLUS
250.0000 mL | Freq: Once | INTRAVENOUS | Status: AC
Start: 2022-01-08 — End: 2022-01-08
  Administered 2022-01-08: 250 mL via INTRAVENOUS

## 2022-01-08 MED ORDER — SODIUM CHLORIDE 0.9% FLUSH
3.0000 mL | Freq: Two times a day (BID) | INTRAVENOUS | Status: DC
  Administered 2022-01-08 – 2022-01-14 (×12): 3 mL via INTRAVENOUS

## 2022-01-08 MED ORDER — SODIUM CHLORIDE 0.9 % IV SOLN: 250.0000 mL | INTRAVENOUS | Status: DC | PRN

## 2022-01-08 MED ORDER — ALBUTEROL SULFATE (2.5 MG/3ML) 0.083% IN NEBU
3.0000 mL | INHALATION_SOLUTION | Freq: Four times a day (QID) | RESPIRATORY_TRACT | Status: DC | PRN
  Administered 2022-01-12: 3 mL via RESPIRATORY_TRACT
  Filled 2022-01-08: qty 3

## 2022-01-08 MED ORDER — OYSTER SHELL CALCIUM/D3 500-5 MG-MCG PO TABS
1.0000 | ORAL_TABLET | Freq: Two times a day (BID) | ORAL | Status: DC
  Administered 2022-01-08 – 2022-01-13 (×11): 1 via ORAL
  Filled 2022-01-08 (×11): qty 1

## 2022-01-08 MED ORDER — ENOXAPARIN SODIUM 40 MG/0.4ML IJ SOSY
40.0000 mg | PREFILLED_SYRINGE | INTRAMUSCULAR | Status: DC
  Administered 2022-01-08 – 2022-01-12 (×5): 40 mg via SUBCUTANEOUS
  Filled 2022-01-08 (×5): qty 0.4

## 2022-01-08 MED ORDER — ADULT MULTIVITAMIN W/MINERALS CH
1.0000 | ORAL_TABLET | Freq: Every day | ORAL | Status: DC
  Administered 2022-01-09 – 2022-01-13 (×5): 1 via ORAL
  Filled 2022-01-08 (×5): qty 1

## 2022-01-08 MED ORDER — PRAVASTATIN SODIUM 20 MG PO TABS
40.0000 mg | ORAL_TABLET | Freq: Every day | ORAL | Status: DC
Start: 2022-01-08 — End: 2022-01-14
  Administered 2022-01-08 – 2022-01-13 (×6): 40 mg via ORAL
  Filled 2022-01-08 (×6): qty 2

## 2022-01-08 MED ORDER — ENSURE ENLIVE PO LIQD: 237.0000 mL | Freq: Two times a day (BID) | ORAL | Status: DC

## 2022-01-08 NOTE — ED Triage Notes (Signed)
Pt is BIB [facility] from [facility] for confusion. Pt is baseline per EMS who transports the same patient often. Pt was just seen here on 12/28/21 for same. Staff at [facility] they cannot care for him anymore. Vitals stable for EMS. BGL 112. Pt has no complaints. Pt was able to stand and pivot for EMS and stand and be seated on stretcher.  ?

## 2022-01-08 NOTE — ED Notes (Signed)
Pt had another large BM. Cleaned and placed in clean brief. New male purewick in place.  ?

## 2022-01-08 NOTE — ED Provider Notes (Signed)
? ?[facility] ?Provider Note ? ? ? Event Date/Time  ? First MD Initiated Contact with Patient 01/08/22 [phone]   ?  (approximate) ? ? ?History  ? ?Altered Mental Status ? ? ?HPI ? ?Luis [name]. is a 76 y.o. male extensive past medical history including CHF cirrhosis CAD depression multiple myeloma presents to the ER for confusion and reported shortness of breath per facility.  Patient states that he is always short of breath.  States his belly does feel more distended but is not having pain.  Denies any dysuria.  He does work 2 L chronically and arrives to the ER 2 L nasal cannula satting 98%. ? ? ?According to staff at [facility] rehab patient was sent out due to increasing confusion worsening hypoxia requiring 4 L nasal cannula to keep O2 sats above 90 worsening abdominal distention over the past 2 to 3 days. ? ? ? ? ?Physical Exam  ? ?Triage Vital Signs: ?ED Triage Vitals [01/08/22 0801]  ?Enc Vitals Group  ?   BP (!) 143/64  ?   Pulse Rate 96  ?   Resp 20  ?   Temp 98 ?F (36.7 ?C)  ?   Temp Source Oral  ?   SpO2 94 %  ?   Weight 191 lb 12.8 oz (87 kg)  ?   Height '5\' 6"'  (1.676 m)  ?   Head Circumference   ?   Peak Flow   ?   Pain Score 0  ?   Pain Loc   ?   Pain Edu?   ?   Excl. in [place]?   ? ? ?Most recent vital signs: ?Vitals:  ? 01/08/22 0900 01/08/22 1000  ?BP: (!) 120/38 (!) 126/48  ?Pulse: 90 91  ?Resp: 14 15  ?Temp:    ?SpO2: 100% 99%  ? ? ? ?Constitutional: Alert but confused unable to provide much history ?Eyes: Conjunctivae are normal.  ?Head: Atraumatic. ?Nose: No congestion/rhinnorhea. ?Mouth/Throat: Mucous membranes are moist.   ?Neck: Painless ROM.  ?Cardiovascular:   Good peripheral circulation. ?Respiratory: Normal respiratory effort.  No retractions.  ?Gastrointestinal: Soft and nontender.  ?Musculoskeletal:  no deformity ?Neurologic:  MAE spontaneously. No gross focal neurologic deficits are appreciated.  ?Skin:  Skin is warm, dry and intact. No rash  noted. ?Psychiatric: Mood and affect are normal. Speech and behavior are normal. ? ? ? ?ED Results / Procedures / Treatments  ? ?Labs ?(all labs ordered are listed, but only abnormal results are displayed) ?Labs Reviewed  ?AMMONIA - Abnormal; Notable for the following components:  ?    Result Value  ? Ammonia 47 (*)   ? All other components within normal limits  ?LACTIC ACID, PLASMA - Abnormal; Notable for the following components:  ? Lactic Acid, Venous 2.5 (*)   ? All other components within normal limits  ?COMPREHENSIVE METABOLIC PANEL - Abnormal; Notable for the following components:  ? BUN 49 (*)   ? Creatinine, Ser 2.03 (*)   ? Calcium 8.3 (*)   ? Albumin 2.6 (*)   ? AST 58 (*)   ? Total Bilirubin 2.4 (*)   ? GFR, Estimated 33 (*)   ? All other components within normal limits  ?CBC WITH DIFFERENTIAL/PLATELET - Abnormal; Notable for the following components:  ? RBC 2.72 (*)   ? Hemoglobin 10.2 (*)   ? HCT 31.4 (*)   ? MCV 115.4 (*)   ? MCH 37.5 (*)   ?  RDW 16.8 (*)   ? All other components within normal limits  ?PROTIME-INR - Abnormal; Notable for the following components:  ? Prothrombin Time 15.8 (*)   ? INR 1.3 (*)   ? All other components within normal limits  ?BLOOD GAS, VENOUS - Abnormal; Notable for the following components:  ? pH, Ven 7.44 (*)   ? pCO2, Ven 43 (*)   ? Bicarbonate 29.2 (*)   ? Acid-Base Excess 4.4 (*)   ? All other components within normal limits  ?RESP PANEL BY RT-PCR (FLU A&B, COVID) [id number]  ?CULTURE, BLOOD (ROUTINE X 2)  ?CULTURE, BLOOD (ROUTINE X 2)  ?APTT  ?LACTIC ACID, PLASMA  ?URINALYSIS, COMPLETE (UACMP) WITH MICROSCOPIC  ? ? ? ?EKG ? ?ED ECG REPORT ?I, Merlyn Lot, the attending physician, personally viewed and interpreted this ECG. ? ? Date: 01/08/2022 ? EKG Time: 8:41 ? ? ? Rate: 95 ? Rhythm: sinus ? Axis: normal ? Intervals: prolonged qt ? ST&T Change: nonspecific st abn ? ? ? ?RADIOLOGY ?Please see ED Course for my review and interpretation. ? ?I personally reviewed all  radiographic images ordered to evaluate for the above acute complaints and reviewed radiology reports and findings.  These findings were personally discussed with the patient.  Please see medical record for radiology report. ? ? ? ?PROCEDURES: ? ?Critical Care performed: Yes, see critical care procedure note(s) ? ?.Critical Care ?Performed by: Merlyn Lot, MD ?Authorized by: Merlyn Lot, MD  ? ?Critical care provider statement:  ?  Critical care time (minutes):  34 ?  Critical care was necessary to treat or prevent imminent or life-threatening deterioration of the following conditions:  Circulatory failure and CNS failure or compromise ?  Critical care was time spent personally by me on the following activities:  Ordering and performing treatments and interventions, ordering and review of laboratory studies, ordering and review of radiographic studies, pulse oximetry, re-evaluation of patient's condition, review of old charts, obtaining history from patient or surrogate, examination of patient, evaluation of patient's response to treatment, discussions with primary provider, discussions with consultants and development of treatment plan with patient or surrogate ? ? ?MEDICATIONS ORDERED IN ED: ?Medications  ?sodium chloride 0.9 % bolus 250 mL (has no administration in time range)  ?lactulose (CHRONULAC) 10 GM/15ML solution 30 g (30 g Oral Given 01/08/22 1050)  ? ? ? ?IMPRESSION / MDM / ASSESSMENT AND PLAN / ED COURSE  ?I reviewed the triage vital signs and the nursing notes. ?             ?               ? ?Differential diagnosis includes, but is not limited to, Dehydration, sepsis, pna, uti, hypoglycemia, cva, drug effect, withdrawal, encephalitis ? ?Presenting with confusion altered mental status as described above.  Patient is frail and ill-appearing.  Blood work was sent off for the blood differential.  Does not seem meningitic.  Clinically seems metabolic encephalopathy.  CT imaging will be ordered no  report of any trauma. ? ? ?Clinical Course as of 01/08/22 1106  ?Fri Jan 08, 2022  ?0945 Patient's ammonia level is elevated may be having component of hepatic encephalopathy will give lactulose. [PR]  ?1050 Lactate is mildly elevated but I suspect this is secondary to cirrhosis rather than sepsis given lack of fever hypotension and her white count.  Given his elevated ammonia level presentation seemingly metabolic encephalopathy get ordered lactulose still awaiting urine.  Family reports pretty significant decline in  status over the past few days initially with some slurred speech will order MRI to further evaluate for stroke.  Hospitalist been consulted for admission and agrees except patient to their service. [PR]  ?  ?Clinical Course User Index ?[PR] Merlyn Lot, MD  ? ? ? ?FINAL CLINICAL IMPRESSION(S) / ED DIAGNOSES  ? ?Final diagnoses:  ?Hepatic encephalopathy  ? ? ? ?Rx / DC Orders  ? ?ED Discharge Orders   ? ? None  ? ?  ? ? ? ?Note:  This document was prepared using Dragon voice recognition software and may include unintentional dictation errors. ? ?  ?Merlyn Lot, MD ?01/08/22 1106 ? ?

## 2022-01-08 NOTE — ED Notes (Signed)
Pt had large liquid BM.  ?

## 2022-01-08 NOTE — Assessment & Plan Note (Addendum)
PT/OT are recommending SNF. ?-TOC to look for different options as he ran out of his SNF days ?

## 2022-01-08 NOTE — H&P (Signed)
History and Physical    Patient: Luis [name]. PRF:[id number] DOB: 08/02/1946 DOA: 01/08/2022 DOS: the patient was seen and examined on 01/08/2022 PCP: Tracie Harrier, MD  Patient coming from: SNF  Chief Complaint:  Chief Complaint  Patient presents with   Altered Mental Status    HPI: Luis Cimo. is a 76 y.o. male with medical history significant for coronary artery disease status post CABG, history of multiple myeloma, diabetes mellitus with stage III chronic kidney disease, hypertension, anxiety and depression, recent hospitalization for MSSA sepsis from skin and soft tissue infection (completed IV antibiotic therapy with cefazolin on 12/31/21) who presents to the ER via EMS for evaluation of confusion per nursing home staff.  His family is at the bedside and states that he has been very weak. During my evaluation patient is awake, alert and oriented to person and place but not to time.  He states that he has a cough that is occasionally productive of yellow phlegm but he denies having any fever, no chills, no abdominal pain, no nausea, no vomiting, no dizziness, no lightheadedness, no headache, no blurred vision or focal deficit. Patient was noted to have elevated ammonia levels and received a dose of lactulose. He will be admitted to the hospital for further evaluation.  Review of Systems: As mentioned in the history of present illness. All other systems reviewed and are negative. Past Medical History:  Diagnosis Date   Angina pectoris ([place])    Anxiety    Arthritis    CHF (congestive heart failure) ([place])    Coronary artery disease    Depression    Diabetes mellitus without complication ([place])    Patient takes Metformin.   Diverticulosis    GERD (gastroesophageal reflux disease)    Hyperlipidemia    Hypertension    Multiple myeloma ([place])    Multiple myeloma ([place]) 03/27/2015   Myocardial infarction [facility]) April 2001   widowmaker   Shortness of breath dyspnea    Sleep  apnea    No CPAP @ present   Spinal stenosis    Squamous cell carcinoma of skin 02/19/2014   Left dorsal hand. WD SCC with superficial infiltration. Re-shaved 04/23/2014. Re-shaved 09/05/2014.   Squamous cell carcinoma of skin 09/08/2020   Left ant scalp. WD SCC.   Squamous cell carcinoma of skin 02/04/2021   R hand dorsum, EDC    Past Surgical History:  Procedure Laterality Date   CARDIAC CATHETERIZATION     CARPAL TUNNEL RELEASE     CATARACT EXTRACTION     COLONOSCOPY WITH PROPOFOL N/A 11/11/2015   Procedure: COLONOSCOPY WITH PROPOFOL;  Surgeon: Lollie Sails, MD;  Location: [facility] ENDOSCOPY;  Service: Endoscopy;  Laterality: N/A;   CORONARY ARTERY BYPASS GRAFT     EYE SURGERY Bilateral    Cataract Extraction   INGUINAL HERNIA REPAIR     JOINT REPLACEMENT Right 2008   Right Total Hip Replacement   PILONIDAL CYST EXCISION     ROTATOR CUFF REPAIR     TOTAL HIP ARTHROPLASTY Right    VENTRAL HERNIA REPAIR N/A 08/15/2015   Procedure: VENTRAL HERNIA REPAIR WITH MESH ;  Surgeon: Leonie Green, MD;  Location: [facility] ORS;  Service: General;  Laterality: N/A;   Social History:  reports that he quit smoking about 21 years ago. His smoking use included cigarettes. He has a 60.00 pack-year smoking history. He quit smokeless tobacco use about 21 years ago. He reports that he does not drink alcohol and  does not use drugs.  Allergies  Allergen Reactions   Pravastatin Sodium Other (See Comments)   Statins Other (See Comments)    Muscle aches   Zocor [Simvastatin] Other (See Comments)    Muscle aches    Family History  Problem Relation Age of Onset   Heart disease Father    Stroke Mother    Prostate cancer Maternal Grandfather 90    Prior to Admission medications   Medication Sig Start Date End Date Taking? Authorizing Provider  acetaminophen (TYLENOL) 325 MG tablet Take 2 tablets (650 mg total) by mouth every 6 (six) hours as needed for mild pain (or Fever >/= 101). 12/18/21    Lorella Nimrod, MD  ALPRAZolam Duanne Moron) 0.25 MG tablet Take 0.25 mg by mouth at bedtime as needed. for sleep 02/06/15   [provider]  aspirin 81 MG tablet Take 81 mg by mouth daily.    [provider]  Bee Pollen 500 MG CHEW Chew 1 tablet by mouth 2 (two) times daily.    [provider]  Calcium Carbonate-Vitamin D 600-400 MG-UNIT chew tablet Chew 1 tablet by mouth 2 (two) times daily.     [provider]  feeding supplement (ENSURE ENLIVE / ENSURE PLUS) LIQD Take 237 mLs by mouth 2 (two) times daily between meals. 12/18/21   Lorella Nimrod, MD  glucose blood test strip USE ONCE DAILY. USE AS INSTRUCTED. DX E11.9 10/04/18   [provider]  ipratropium-albuterol (DUONEB) 0.5-2.5 (3) MG/3ML SOLN Take 3 mLs by nebulization every 6 (six) hours as needed. Patient not taking: Reported on 02/05/2021 11/13/20   Enzo Bi, MD  lidocaine (LIDODERM) 5 % Place 1 patch onto the skin every 12 (twelve) hours. Remove & Discard patch within 12 hours or as directed by MD Patient not taking: Reported on 02/05/2021 01/13/21 01/13/22  Sable Feil, PA-C  loratadine (CLARITIN) 10 MG tablet Take 1 tablet (10 mg total) by mouth daily. 12/18/21   Lorella Nimrod, MD  lovastatin (MEVACOR) 40 MG tablet Take 40 mg by mouth at bedtime.     [provider]  metFORMIN (GLUCOPHAGE) 500 MG tablet Take 500 mg by mouth daily with breakfast.    [provider]  midodrine (PROAMATINE) 2.5 MG tablet Take 1 tablet (2.5 mg total) by mouth 3 (three) times daily with meals. 12/18/21   Lorella Nimrod, MD  Multiple Vitamin (MULTIVITAMIN WITH MINERALS) TABS tablet Take 1 tablet by mouth daily. 12/18/21   Lorella Nimrod, MD  Nutritional Supplements (,FEEDING SUPPLEMENT, PROSOURCE PLUS) liquid Take 30 mLs by mouth 3 (three) times daily between meals. 12/18/21   Lorella Nimrod, MD  omeprazole (PRILOSEC) 20 MG capsule Take 20 mg by mouth daily.    [provider]  PARoxetine (PAXIL) 10 MG  tablet Take 10 mg by mouth daily.    [provider]  VENTOLIN HFA 108 (90 Base) MCG/ACT inhaler Inhale 2 puffs into the lungs every 6 (six) hours as needed for wheezing. 10/31/21   [provider]    Physical Exam: Vitals:   01/08/22 0900 01/08/22 1000 01/08/22 1030 01/08/22 1100  BP: (!) 120/38 (!) 126/48 (!) 118/41 (!) 112/57  Pulse: 90 91 92 95  Resp: _0 Temp:      TempSrc:      SpO2: 100% 99% 96% 96%  Weight:      Height:       Physical Exam Vitals and nursing note reviewed.  Constitutional:  Appearance: He is normal weight.     Comments: Patient is chronically ill-appearing.  Lethargic but opens eyes to verbal stimuli.  HENT:     Head: Normocephalic and atraumatic.     Nose: Nose normal.     Comments: Nasal cannula in place    Mouth/Throat:     Mouth: Mucous membranes are moist.  Eyes:     Conjunctiva/sclera: Conjunctivae normal.     Pupils: Pupils are equal, round, and reactive to light.  Cardiovascular:     Rate and Rhythm: Normal rate and regular rhythm.  Pulmonary:     Effort: Pulmonary effort is normal.     Breath sounds: Normal breath sounds.  Abdominal:     General: Bowel sounds are normal.     Palpations: Abdomen is soft.     Comments: Central adiposity  Musculoskeletal:        General: Normal range of motion.     Cervical back: Normal range of motion and neck supple.     Right lower leg: Edema present.     Left lower leg: Edema present.  Skin:    General: Skin is warm and dry.  Neurological:     Motor: Weakness present.     Gait: Gait abnormal.     Comments: Oriented to person and place but not to time  Psychiatric:     Comments: Depressed mood, flat affect     Data Reviewed: Relevant notes from primary care and specialist visits, past discharge summaries as available in EHR, including Care Everywhere. Prior diagnostic testing as pertinent to current admission diagnoses Updated medications and problem lists for  reconciliation ED course, including vitals, labs, imaging, treatment and response to treatment Triage notes, nursing and pharmacy notes and ED provider's notes Notable results as noted in HPI Labs reviewed.  Lactic acid 2.5 >> 2.1, ammonia is 47 VBG seven-point 4/43/32/20 9.2/45.1 White count 4.6, hemoglobin 10.2, hematocrit 31.4, platelet count 178, BUN 49, creatinine 2.03, albumin 2.6, AST 58, ALT 5, total bilirubin 2.4, PT 15.8, INR 1.3 Chest x-ray reviewed by me shows chronic interstitial changes.  No infiltrate or effusion. CT scan of the head without contrast shows no acute findings Twelve-lead EKG done me shows sinus rhythm with PVCs. There are no new results to review at this time.  Assessment and Plan: * Hepatic encephalopathy Patient with a history of alcoholic liver cirrhosis status post recent paracentesis who presents for evaluation of confusion per nursing home staff. Noted to have elevated ammonia levels We will start patient on lactulose 30 g p.o. 3 times daily.  Titrate for 2-3 soft stools Start patient empirically on Rocephin 2 g IV daily for GI prophylaxis Patient not on diuretics or beta-blockers due to relative hypotension Maintain low-sodium diet  Chronic diastolic CHF (congestive heart failure) (HCC) Stable Not acutely exacerbated  Depression with anxiety Stable Continue Paxil and alprazolam  CAD (coronary artery disease) of artery bypass graft Stable Continue aspirin and statins Patient not on a beta-blocker due to relative hypotension  Type 2 diabetes mellitus without complication, without long-term current use of insulin (HCC) Hold metformin Maintain consistent carbohydrate diet Check blood sugars with meals  Generalized weakness Secondary to physical deconditioning following recent hospitalization. Patient is at high risk for falls We will place patient on fall precautions PT evaluation  Multiple myeloma ([place]) Chronic Stable     Advance  Care Planning:   Code Status: DNR   Consults: Physical therapy, TOC  Family Communication: Greater than 50% of time  is spent discussing patient's condition and plan of care with his family at the bedside.  All questions and concerns have been addressed.  They verbalized understanding and agree with the plan.  Severity of Illness: The appropriate patient status for this patient is INPATIENT. Inpatient status is judged to be reasonable and necessary in order to provide the required intensity of service to ensure the patient's safety. The patient's presenting symptoms, physical exam findings, and initial radiographic and laboratory data in the context of their chronic comorbidities is felt to place them at high risk for further clinical deterioration. Furthermore, it is not anticipated that the patient will be medically stable for discharge from the hospital within 2 midnights of admission.   * I certify that at the point of admission it is my clinical judgment that the patient will require inpatient hospital care spanning beyond 2 midnights from the point of admission due to high intensity of service, high risk for further deterioration and high frequency of surveillance required.*  Author: Collier Bullock, MD 01/08/2022 11:49 AM  For on call review [email or web address].

## 2022-01-08 NOTE — Assessment & Plan Note (Addendum)
No chest pain continue on aspirin and statin not on beta-blocker due to hypotension. On torsemide and Aldactone with liver cirrhosis-hopefully can resume. ?

## 2022-01-08 NOTE — Assessment & Plan Note (Addendum)
History, labs stable.  Outpatient follow-up advised ?

## 2022-01-08 NOTE — Assessment & Plan Note (Addendum)
Blood sugar is stable ?-continue to hold metformin  ?-Continue sliding scale insulin with low-carb diet.   ? ?

## 2022-01-08 NOTE — Assessment & Plan Note (Addendum)
Mood stable continue as multiple regimen with bedtime Seroquel, Paxil, Xanax ?

## 2022-01-08 NOTE — Assessment & Plan Note (Addendum)
Acute metabolic encephalopathy ?Generalized weakness/debility: ?Patient presenting with confusion worse from baseline along with generalized weakness, work-up so far unremarkable for UTI pneumonia MRI brain and CT head no acute finding.  Likely multifactorial in the setting of hepatic encephalopathy, history of alcohol liver cirrhosis with recent paracentesis. ?-Appears to be at baseline from mental standpoint. ?-PT/OT is recommending SNF-patient has only 3 days left. ?-Might be more appropriate going back to his assisted living with hospice care. ?-TOC to look for options ?

## 2022-01-08 NOTE — ED Notes (Signed)
Pt had another small BM of liquid stoool at this time. Changed and placed in new clean brief.  ?

## 2022-01-08 NOTE — Progress Notes (Signed)
PHARMACIST - PHYSICIAN COMMUNICATION ? ?CONCERNING:  Enoxaparin (Lovenox) for DVT Prophylaxis  ? ? ?RECOMMENDATION: ?Patient was prescribed enoxaprin 30mg  q24 hours for VTE prophylaxis.  ? Danley Danker Weights  ? 01/08/22 0801  ?Weight: 87 kg (191 lb 12.8 oz)  ? ? ?Body mass index is 30.96 kg/m?. ? ?Estimated Creatinine Clearance: 31.5 mL/min (A) (by C-G formula based on SCr of 2.03 mg/dL (H)). ? ? ?Patient is candidate for enoxaparin 30mg  every 24 hours based on CrCl >70ml/min or Weight >45kg ? ?DESCRIPTION: ?Pharmacy has adjusted enoxaparin dose per [facility] policy. ? ?Patient is now receiving enoxaparin 40 mg every 24 hours  ? ? ?Sherilyn Banker, PharmD ?Clinical Pharmacist  ?01/08/2022 ?11:51 AM ? ?

## 2022-01-08 NOTE — Assessment & Plan Note (Addendum)
Volume status is stable.  Monitor closely intake output Daily weight. ?

## 2022-01-09 ENCOUNTER — Encounter: Payer: Self-pay | Admitting: Internal Medicine

## 2022-01-09 DIAGNOSIS — L8990 Pressure ulcer of unspecified site, unspecified stage: Secondary | ICD-10-CM | POA: Diagnosis present

## 2022-01-09 DIAGNOSIS — G9341 Metabolic encephalopathy: Secondary | ICD-10-CM

## 2022-01-09 DIAGNOSIS — K7682 Hepatic encephalopathy: Secondary | ICD-10-CM | POA: Diagnosis not present

## 2022-01-09 DIAGNOSIS — N1832 Chronic kidney disease, stage 3b: Secondary | ICD-10-CM

## 2022-01-09 LAB — COMPREHENSIVE METABOLIC PANEL
ALT: 7 U/L (ref 0–44)
AST: 40 U/L (ref 15–41)
Albumin: 2.2 g/dL — ABNORMAL LOW (ref 3.5–5.0)
Alkaline Phosphatase: 53 U/L (ref 38–126)
Anion gap: 11 (ref 5–15)
BUN: 43 mg/dL — ABNORMAL HIGH (ref 8–23)
CO2: 27 mmol/L (ref 22–32)
Calcium: 7.9 mg/dL — ABNORMAL LOW (ref 8.9–10.3)
Chloride: 108 mmol/L (ref 98–111)
Creatinine, Ser: 1.7 mg/dL — ABNORMAL HIGH (ref 0.61–1.24)
GFR, Estimated: 41 mL/min — ABNORMAL LOW (ref >60)
Glucose, Bld: 88 mg/dL (ref 70–99)
Potassium: 3.4 mmol/L — ABNORMAL LOW (ref 3.5–5.1)
Sodium: 146 mmol/L — ABNORMAL HIGH (ref 135–145)
Total Bilirubin: 1.6 mg/dL — ABNORMAL HIGH (ref 0.3–1.2)
Total Protein: 6.9 g/dL (ref 6.5–8.1)

## 2022-01-09 LAB — GLUCOSE, CAPILLARY
Glucose-Capillary: 91 mg/dL (ref 70–99)
Glucose-Capillary: 84 mg/dL (ref 70–99)
Glucose-Capillary: 103 mg/dL — ABNORMAL HIGH (ref 70–99)
Glucose-Capillary: 105 mg/dL — ABNORMAL HIGH (ref 70–99)

## 2022-01-09 MED ORDER — QUETIAPINE FUMARATE 25 MG PO TABS
25.0000 mg | ORAL_TABLET | Freq: Every day | ORAL | Status: DC
  Administered 2022-01-09 – 2022-01-13 (×5): 25 mg via ORAL
  Filled 2022-01-09 (×5): qty 1

## 2022-01-09 MED ORDER — METOPROLOL TARTRATE 25 MG PO TABS
12.5000 mg | ORAL_TABLET | Freq: Two times a day (BID) | ORAL | Status: DC
  Administered 2022-01-09 – 2022-01-10 (×3): 12.5 mg via ORAL
  Filled 2022-01-09 (×3): qty 1

## 2022-01-09 MED ORDER — SPIRONOLACTONE 25 MG PO TABS
25.0000 mg | ORAL_TABLET | Freq: Every day | ORAL | Status: DC
  Administered 2022-01-09 – 2022-01-11 (×3): 25 mg via ORAL
  Filled 2022-01-09 (×3): qty 1

## 2022-01-09 MED ORDER — ORAL CARE MOUTH RINSE
15.0000 mL | Freq: Two times a day (BID) | OROMUCOSAL | Status: DC
  Administered 2022-01-09 – 2022-01-14 (×10): 15 mL via OROMUCOSAL

## 2022-01-09 MED ORDER — TORSEMIDE 20 MG PO TABS
20.0000 mg | ORAL_TABLET | Freq: Every day | ORAL | Status: DC
  Administered 2022-01-09 – 2022-01-11 (×3): 20 mg via ORAL
  Filled 2022-01-09 (×3): qty 1

## 2022-01-09 MED ORDER — TAMSULOSIN HCL 0.4 MG PO CAPS
0.4000 mg | ORAL_CAPSULE | Freq: Every day | ORAL | Status: DC
  Administered 2022-01-09 – 2022-01-14 (×6): 0.4 mg via ORAL
  Filled 2022-01-09 (×6): qty 1

## 2022-01-09 NOTE — Assessment & Plan Note (Signed)
See above

## 2022-01-09 NOTE — Assessment & Plan Note (Addendum)
Alcoholic liver cirrhosis with ascites with recent paracentesis: See above, mildly distended abdomen.  His home dose of torsemide and spironolactone was resumed but resulted in worsening hypotension. ?-Hold torsemide and spironolactone. ?-Abdominal ultrasound to see the quantity of cirrhosis-might need another paracentesis. ?-No sign of infection- we will stop ceftriaxone ?

## 2022-01-09 NOTE — Assessment & Plan Note (Signed)
BMI 30.9 will benefit with weight loss. ?

## 2022-01-09 NOTE — Evaluation (Signed)
Physical Therapy Evaluation ?Patient Details ?Name: Luis [name]. ?MRN: [id number] ?DOB: 1946/02/28 ?Today's Date: 01/09/2022 ? ?History of Present Illness ? 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  S/p L knee aspiration 01/01/22.  S/p arthroscopic irrigation debridement of L knee secondary septic L knee joint 01/02/22. PT has been re-consulted as pt is now s/p Left common femoral, superficial femoral & profunda femoris endarterectomy & [place] embolectomy of the left SFA and popliteal arteries on 01/08/22.  ?Clinical Impression ? Pt seen for PT evaluation with brother present. Pt agreeable & pleasant throughout session. Pt requires min assist for sit>stand but CGA for gait out into hallway & back with RW with decreased gait speed. Pt appears fatigued after gait but participates in BLE exercises for strengthening. Pt would benefit from STR upon d/c to maximize independence with functional mobility & reduce fall risk prior to return home. ?   ?Recommendations for follow up therapy are one component of a multi-disciplinary discharge planning process, led by the attending physician.  Recommendations may be updated based on patient status, additional functional criteria and insurance authorization. ? ?Follow Up Recommendations Skilled nursing-short term rehab (<3 hours/day) ? ?  ?Assistance Recommended at Discharge Frequent or constant Supervision/Assistance  ?Patient can return home with the following ? A little help with walking and/or transfers;A little help with bathing/dressing/bathroom;Assistance with cooking/housework;Assist for transportation;Help with stairs or ramp for entrance ? ?  ?Equipment Recommendations Rolling walker (2 wheels);BSC/3in1  ?Recommendations for Other Services ?    ?  ?Functional Status Assessment Patient has had a  recent decline in their functional status and demonstrates the ability to make significant improvements in function in a reasonable and predictable amount of time.  ? ?  ?Precautions / Restrictions Precautions ?Precautions: Fall ?Restrictions ?Weight Bearing Restrictions: No  ? ?  ? ?Mobility ? Bed Mobility ?  ?  ?  ?  ?  ?  ?  ?General bed mobility comments: not observed, pt received & left sitting in recliner ?  ? ?Transfers ?Overall transfer level: Needs assistance ?Equipment used: Rolling walker (2 wheels) ?Transfers: Sit to/from Stand ?Sit to Stand: Min guard ?  ?  ?  ?  ?  ?General transfer comment: Pt elects to count to 3 prior to sit>stand, uses armrests to push to standing, requires min assist. ?  ? ?Ambulation/Gait ?Ambulation/Gait assistance: Min guard, Supervision ?Gait Distance (Feet): 30 Feet ?Assistive device: Rolling walker (2 wheels) ?Gait Pattern/deviations: Decreased step length - left, Decreased step length - right, Decreased dorsiflexion - right, Decreased dorsiflexion - left, Step-to pattern, Trunk flexed ?Gait velocity: decreased ?  ?  ?  ? ?Stairs ?  ?  ?  ?  ?  ? ?Wheelchair Mobility ?  ? ?Modified Rankin (Stroke Patients Only) ?  ? ?  ? ?Balance Overall balance assessment: Needs assistance ?Sitting-balance support: Feet supported ?Sitting balance-Leahy Scale: Fair ?  ?  ?Standing balance support: During functional activity, Bilateral upper extremity supported ?Standing balance-Leahy Scale: Fair ?Standing balance comment: BUE support on RW ?  ?  ?  ?  ?  ?  ?  ?  ?  ?  ?  ?   ? ? ? ?Pertinent Vitals/Pain Pain Assessment ?Pain Assessment: Faces ?Faces Pain Scale: Hurts a little bit ?Pain Location: "my waist" but  doens't rate nor describe, "maybe because of the fluid" ?Pain Descriptors / Indicators:  (doesn't describe) ?Pain Intervention(s): Monitored during session  ? ? ?Home Living Family/patient expects to be discharged to:: Skilled nursing facility ?Living Arrangements: Alone ?  ?Type of  Home: Assisted living ?  ?  ?  ?  ?  ?Home Equipment: Conservation officer, nature (2 wheels);Cane - single point;Wheelchair - Psychologist, educational ?Additional Comments: Prior to rehab pt from [facility], assist for meals/meds only  ?  ?Prior Function Prior Level of Function : Independent/Modified Independent ?  ?  ?  ?  ?  ?  ?Mobility Comments: prior to STR stay pt MOD I using SPC, since rehab pt requires assist with mobility, endorses ~4 falls in the past 6 months (will lose balance posteriorly) ?  ?  ? ? ?Hand Dominance  ? Dominant Hand: Right ? ?  ?Extremity/Trunk Assessment  ? Upper Extremity Assessment ?Upper Extremity Assessment: Generalized weakness ?  ? ?Lower Extremity Assessment ?Lower Extremity Assessment: Generalized weakness ?  ? ?Cervical / Trunk Assessment ?Cervical / Trunk Assessment:  (rounded shoulders)  ?Communication  ? Communication: No difficulties  ?Cognition Arousal/Alertness: Awake/alert ?Behavior During Therapy: [facility] for tasks assessed/performed ?Overall Cognitive Status: Within Functional Limits for tasks assessed ?  ?  ?  ?  ?  ?  ?  ?  ?  ?  ?  ?  ?  ?  ?  ?  ?General Comments: pleasant ?  ?  ? ?  ?General Comments General comments (skin integrity, edema, etc.): Pt on 2L/min via nasal cannula throughout session with SPO2 >90% ? ?  ?Exercises General Exercises - Lower Extremity ?Long Arc Quad: AROM, Strengthening, Both, 10 reps, Seated ?Hip Flexion/Marching: AROM, Strengthening, Both, 10 reps, Seated  ? ?Assessment/Plan  ?  ?PT Assessment Patient needs continued PT services  ?PT Problem List Decreased strength;Decreased activity tolerance;Decreased balance;Decreased mobility;Decreased range of motion;Decreased knowledge of use of DME;Decreased safety awareness;Pain;Cardiopulmonary status limiting activity ? ?   ?  ?PT Treatment Interventions DME instruction;Gait training;Stair training;Functional mobility training;Therapeutic activities;Therapeutic exercise;Balance training;Neuromuscular  re-education;Patient/family education;Wheelchair mobility training;Manual techniques   ? ?PT Goals (Current goals can be found in the Care Plan section)  ?Acute Rehab PT Goals ?Patient Stated Goal: get better ?PT Goal Formulation: With patient ?Time For Goal Achievement: 01/23/22 ?Potential to Achieve Goals: Fair ? ?  ?Frequency Min 2X/week ?  ? ? ?Co-evaluation   ?  ?  ?  ?  ? ? ?  ?AM-PAC PT "6 Clicks" Mobility  ?Outcome Measure Help needed turning from your back to your side while in a flat bed without using bedrails?: A Little ?Help needed moving from lying on your back to sitting on the side of a flat bed without using bedrails?: A Little ?Help needed moving to and from a bed to a chair (including a wheelchair)?: A Little ?Help needed standing up from a chair using your arms (e.g., wheelchair or bedside chair)?: A Little ?Help needed to walk in hospital room?: A Little ?Help needed climbing 3-5 steps with a railing? : A Lot ?6 Click Score: 17 ? ?  ?End of Session Equipment Utilized During Treatment: Oxygen;Gait belt ?Activity Tolerance: Patient tolerated treatment well;Patient limited by fatigue ?Patient left: in chair;with chair alarm set;with family/visitor present;with call bell/phone within reach;with nursing/sitter in room ?Nurse Communication: Mobility status ?PT Visit Diagnosis: Difficulty in walking, not elsewhere classified (R26.2);History of falling (Z91.81);Unsteadiness on feet (R26.81);Muscle weakness (generalized) (M62.81) ?  ? ?Time: 8416-6063 ?PT  Time Calculation (min) (ACUTE ONLY): 16 min ? ? ?Charges:   PT Evaluation ?$PT Eval Moderate Complexity: 1 Mod ?  ?  ?   ? ? ?Lavone Nian, PT, DPT ?01/09/22, 2:07 PM ? ? ?Waunita Schooner ?01/09/2022, 2:05 PM ? ?

## 2022-01-09 NOTE — Evaluation (Signed)
Occupational Therapy Evaluation ?Patient Details ?Name: Luis [name]. ?MRN: [id number] ?DOB: 05/09/1946 ?Today's Date: 01/09/2022 ? ? ?History of Present Illness Luis [name]. is a 76 y.o. male with medical history significant for coronary artery disease status post CABG, history of multiple myeloma, diabetes mellitus with stage III chronic kidney disease, hypertension, anxiety and depression, recent hospitalization for MSSA sepsis from skin and soft tissue infection (completed IV antibiotic therapy with cefazolin on 12/31/21) who presents to the ER via EMS for evaluation of confusion per nursing home staff.  ? ?Clinical Impression ?  ?Luis [name] was seen for OT evaluation this date. Prior to hospital admission, pt was requiring assist for mobility at [facility], prior to which pt was MOD I for mobility and ADLs using SPC. Pt lives at [facility], assist for IADLs only. Pt presents to acute OT demonstrating impaired ADL performance and functional mobility 2/2 decreased activity tolerance and functional strength/ROM/balance deficits. Pt currently requires MIN A + HHA sit<>stand and bed>chair, increased time to recover in sitting 2/2 desat low 80s on 1L [place] with mobility, resolved to 88% on 2L [place]. MOD A for LB access in sitting, posterior lean with dynamic activity. Pt would benefit from skilled OT to address noted impairments and functional limitations (see below for any additional details). Upon hospital discharge, recommend STR to maximize pt safety and return to PLOF. ?   ? ?Recommendations for follow up therapy are one component of a multi-disciplinary discharge planning process, led by the attending physician.  Recommendations may be updated based on patient status, additional functional criteria and insurance authorization.  ? ?Follow Up Recommendations ? Skilled nursing-short term rehab (<3 hours/day)  ?  ?Assistance Recommended at Discharge Frequent or constant Supervision/Assistance  ?Patient can return home  with the following A little help with walking and/or transfers;A lot of help with bathing/dressing/bathroom;Help with stairs or ramp for entrance ? ?  ?Functional Status Assessment ? Patient has had a recent decline in their functional status and demonstrates the ability to make significant improvements in function in a reasonable and predictable amount of time.  ?Equipment Recommendations ? BSC/3in1  ?  ?Recommendations for Other Services   ? ? ?  ?Precautions / Restrictions Precautions ?Precautions: Fall ?Restrictions ?Weight Bearing Restrictions: No  ? ?  ? ?Mobility Bed Mobility ?Overal bed mobility: Needs Assistance ?Bed Mobility: Supine to Sit ?  ?  ?Supine to sit: Min assist, HOB elevated ?  ?  ?  ?  ? ?Transfers ?Overall transfer level: Needs assistance ?Equipment used: 1 person hand held assist ?Transfers: Sit to/from Stand, Bed to chair/wheelchair/BSC ?Sit to Stand: Min assist ?  ?  ?Step pivot transfers: Min assist ?  ?  ?  ?  ? ?  ?Balance Overall balance assessment: Needs assistance ?Sitting-balance support: No upper extremity supported, Feet supported ?Sitting balance-Leahy Scale: Good ?  ?  ?Standing balance support: Single extremity supported, During functional activity ?Standing balance-Leahy Scale: Fair ?  ?  ?  ?  ?  ?  ?  ?  ?  ?  ?  ?  ?   ? ?ADL either performed or assessed with clinical judgement  ? ?ADL Overall ADL's : Needs assistance/impaired ?  ?  ?  ?  ?  ?  ?  ?  ?  ?  ?  ?  ?  ?  ?  ?  ?  ?  ?  ?General ADL Comments: MIN A + HHA for ADL t/f,  increased time to recover from desat low 80s, resolved to 88% on 2L [place]. MOD A for LB access seated in chair, posterior lean with dynamic activity  ? ? ? ? ?Pertinent Vitals/Pain Pain Assessment ?Pain Assessment: No/denies pain  ? ? ? ?Hand Dominance   ?  ?Extremity/Trunk Assessment Upper Extremity Assessment ?Upper Extremity Assessment: Generalized weakness ?  ?Lower Extremity Assessment ?Lower Extremity Assessment: Generalized weakness ?  ?  ?   ?Communication Communication ?Communication: No difficulties;Other (comment) ?  ?Cognition Arousal/Alertness: Awake/alert ?Behavior During Therapy: [facility] for tasks assessed/performed ?Overall Cognitive Status: Within Functional Limits for tasks assessed ?  ?  ?  ?  ?  ?  ?  ?  ?  ?  ?  ?  ?  ?  ?  ?  ?General Comments: pleasant ?  ?  ?General Comments  low 80s on 1L [place] with mobility, increased to 88% on 2L [place] ? ?  ?   ?   ? ? ?Home Living Family/patient expects to be discharged to:: Skilled nursing facility ?Living Arrangements: Alone ?  ?Type of Home: Assisted living ?  ?  ?  ?  ?  ?  ?  ?  ?  ?  ?  ?  ?  ?Additional Comments: Prior to rehab pt from [place], assist for meals/meds only ?  ? ?  ?Prior Functioning/Environment Prior Level of Function : Independent/Modified Independent ?  ?  ?  ?  ?  ?  ?Mobility Comments: prior to STR stay pt MOD I using SPC, since rehab pt requires assist with mobility ?  ?  ? ?  ?  ?OT Problem List: Decreased strength;Decreased activity tolerance;Impaired balance (sitting and/or standing);Decreased safety awareness ?  ?   ?OT Treatment/Interventions: Self-care/ADL training;Therapeutic exercise;Energy conservation;DME and/or AE instruction;Therapeutic activities;Patient/family education;Balance training  ?  ?OT Goals(Current goals can be found in the care plan section) Acute Rehab OT Goals ?Patient Stated Goal: to go back to rehab ?OT Goal Formulation: With patient/family ?Time For Goal Achievement: 01/23/22 ?Potential to Achieve Goals: Good ?ADL Goals ?Pt Will Perform Grooming: standing;with modified independence ?Pt Will Perform Lower Body Dressing: with supervision;sit to/from stand ?Pt Will Transfer to Toilet: with supervision;ambulating;regular height toilet  ?OT Frequency: Min 2X/week ?  ? ?Co-evaluation   ?  ?  ?  ?  ? ?  ?AM-PAC OT "6 Clicks" Daily Activity     ?Outcome Measure Help from another person eating meals?: None ?Help from another person taking care of  personal grooming?: A Little ?Help from another person toileting, which includes using toliet, bedpan, or urinal?: A Lot ?Help from another person bathing (including washing, rinsing, drying)?: A Lot ?Help from another person to put on and taking off regular upper body clothing?: A Little ?Help from another person to put on and taking off regular lower body clothing?: A Lot ?6 Click Score: 16 ?  ?End of Session Equipment Utilized During Treatment: Oxygen ?Nurse Communication: Mobility status ? ?Activity Tolerance: Patient tolerated treatment well ?Patient left: in chair;with call bell/phone within reach;with chair alarm set;with family/visitor present ? ?OT Visit Diagnosis: Unsteadiness on feet (R26.81);Muscle weakness (generalized) (M62.81);History of falling (Z91.81)  ?              ?Time: 9021-1155 ?OT Time Calculation (min): 15 min ?Charges:  OT General Charges ?$OT Visit: 1 Visit ?OT Evaluation ?$OT Eval Low Complexity: 1 Low ? ?Dessie Coma, M.S. OTR/L  ?01/09/22, 10:26 AM  ?ascom [phone] ? ?

## 2022-01-09 NOTE — Assessment & Plan Note (Signed)
Likely from decreased clearance with her liver cirrhosis.  Anticipate slow clearance we will repeat in a.m. to ensure improvement ?

## 2022-01-09 NOTE — Progress Notes (Signed)
Chaplain Maggie made initial visit with pt and his brother. Pt has questions about the spiritual implications related to Advanced Directives. Chaplain offered empathetic listening and affirmed pt and his concerns. Pt leans on Christian faith as guiding principle and foundation in his life. Conversation about imagining the afterlife made space for pt to share his mind and heart. Pt said he has been thinking a lot about spiritual things as his health has declined. Chaplain encouraged pt to keep asking questions and to keeping talking with people he trusts about all he cares about. ? ?There is no ACP uploaded in patient's chart yet patient stated he has documents completed and available at his residence.Chaplain will bring AD paperwork for patient to consider completing here at [facility]. ?

## 2022-01-09 NOTE — Hospital Course (Addendum)
76 y.o. male with  hx of CAD S/P CABG, history of multiple myeloma, diabetes mellitus with stage III chronic kidney disease iiB- CREAT ~ 1.7-1.9 ( FEB 2023), hypertension, anxiety and depression, recent hospitalization for MSSA sepsis from skin and soft tissue infection (completed IV antibiotic therapy with cefazolin on 12/31/21) presented for evaluation of altered mental status from nursing facility.  Patient was also reporting very weak. ?In the ED, patient is awake alert oriented to person place but not to time, reported having cough occasional productive with yellow phlegm.  Vital stable afebrile, labs with CKD creatinine slightly elevated from baseline 2.0, AST 58 lactic acid in low 2s, anemia hemoglobin 10.2 g UA no evidence of UTI CT/MRI head chest x-ray -no acute finding.  Ammonia level at 47 total bili 2.4, placed on lactulose and admitted for further management. ? ?3/6: Overnight patient became hypotensive and received 1 L of IV bolus.  Also given an extra dose of midodrine.  Received morning torsemide and spironolactone before nursing staff was told to hold them due to mild worsening of creatinine.  Blood pressure remained borderline, giving 1 L of LR. ?Midodrine dose was increased to 10 mg 3 times a day. ?PT/OT is recommending rehab. ?Apparently only 3 days left for rehab.  ? Does not meet the criteria for inpatient hospice services as requested by son.  Most likely will be going back to [facility] with hospice. ?Also ordered abdominal ultrasound to see the amount of ascites, will consider paracentesis if needed. ? ?3/7: Patient underwent another paracentesis with removal of 4 L of transudative fluid.  Feeling much relieved after the procedure.  He was quite somnolent but easily arousable, able to answer questions appropriately.  Denies any pain. ?Renal function with some worsening, lactic acid remained elevated at 2.2, concern of hepatorenal. ?Also giving some more fluid.  Diuretics were  discontinued. ?Family is looking for a SNF with hospice. ? ?3/8: Clinical condition remain same, patient and family decided to proceed with home hospice, he will be going to a facility with hospice services. ?Son and patient were concerned about needing frequent paracentesis as he becomes very uncomfortable with a small collection of ascites. ? ?Pleurx catheter was placed by IR on 01/14/2022 and hospice can take care of it while he was at facility.  Patient will need as needed removal of fluid.  3 to 4 L at time is more safe for based on his borderline blood pressure. ? ?Patient is unable to tolerate any diuretics due to softer blood pressure and frequent AKI's with his CKD. ? ?Patient is being discharged to a skilled nursing facility along with hospice care. ?He will continue rest of his home medications except mentioned above. ?He will follow-up with his providers for further recommendations. ? ? ? ? ?

## 2022-01-09 NOTE — Assessment & Plan Note (Addendum)
Mild worsening of creatinine to 2.05.  Baseline around 1.9-1.8. ?Patient was restarted on diuretic and developed hypotension overnight. ?-Giving some gentle fluid ?-Holding diuretics ?

## 2022-01-09 NOTE — Assessment & Plan Note (Signed)
Continue wound care offloading for left buttock wound as below ?Pressure Injury 01/08/22 Buttocks Left Stage 1 -  Intact skin with non-blanchable redness of a localized area usually over a bony prominence. reddened (Active)  ?01/08/22 1840  ?Location: Buttocks  ?Location Orientation: Left  ?Staging: Stage 1 -  Intact skin with non-blanchable redness of a localized area usually over a bony prominence.  ?Wound Description (Comments): reddened  ?Present on Admission: Yes  ?   ?Pressure Injury 01/08/22 Buttocks Right Stage 1 -  Intact skin with non-blanchable redness of a localized area usually over a bony prominence. reddened (Active)  ?01/08/22 1840  ?Location: Buttocks  ?Location Orientation: Right  ?Staging: Stage 1 -  Intact skin with non-blanchable redness of a localized area usually over a bony prominence.  ?Wound Description (Comments): reddened  ?Present on Admission: Yes  ? ? ? ? ?

## 2022-01-09 NOTE — Progress Notes (Signed)
PROGRESS NOTE Luis [name].  RXV:[id number] DOB: 09-Mar-1946 DOA: 01/08/2022 PCP: Tracie Harrier, MD   Brief Narrative/Hospital Course:  76 y.o. male with  hx of CAD S/P CABG, history of multiple myeloma, diabetes mellitus with stage III chronic kidney disease iiB- CREAT ~ 1.7-1.9 ( FEB 2023), hypertension, anxiety and depression, recent hospitalization for MSSA sepsis from skin and soft tissue infection (completed IV antibiotic therapy with cefazolin on 12/31/21) presented for evaluation of altered mental status from nursing facility.  Patient was also reporting very weak. In the ED, patient is awake alert oriented to person place but not to time, reported having cough occasional productive with yellow phlegm.  Vital stable afebrile, labs with CKD creatinine slightly elevated from baseline 2.0, AST 58 lactic acid in low 2s, anemia hemoglobin 10.2 g UA no evidence of UTI CT/MRI head chest x-ray -no acute finding.  Ammonia level at 47 total bili 2.4, placed on lactulose and admitted for further management.    Subjective: Seen and examined this morning having his meal more alert awake patient reports he feels much better today feels stronger.  Lactulose is being held due to multiple BMs.  Assessment and Plan: * Hepatic encephalopathy Acute metabolic encephalopathy Generalized weakness/debility: Patient presenting with confusion worse from baseline along with generalized weakness, work-up so far unremarkable for UTI pneumonia MRI brain and CT head no acute finding.  Likely multifactorial in the setting of hepatic encephalopathy, history of alcohol liver cirrhosis with recent paracentesis. Has had multiple bowel movement on lactulose, titrate lactulose for 2-3 soft stool, on empiric ceftriaxone for GI prophylaxis.Patient not on diuretics or beta-blockers due to relative hypotension-on midodrine.Maintain low-sodium diet.  PT OT evaluation supportive care delirium precaution fall precaution.  Acute  metabolic encephalopathy See above  Cirrhosis ([place]) Alcoholic liver cirrhosis with ascites with recent paracentesis: See above, mildly distended abdomen with lower leg edema, will resume his Aldactone and torsemide.  On midodrine.  Lactic acidosis Likely from decreased clearance with her liver cirrhosis.  Anticipate slow clearance we will repeat in a.m. to ensure improvement  Stage 3b chronic kidney disease (CKD) (HCC) Recent baseline creatinine was around 1.7-1.9.  Monitor labs-since creatinine much better at 1.7 we will resume his home Aldactone and torsemide. Recent Labs  Lab 01/08/22 0809 01/09/22 0811  BUN 49* 43*  CREATININE 2.03* 1.70*    CAD (coronary artery disease) of artery bypass graft No chest pain continue on aspirin and statin not on beta-blocker due to hypotension. On torsemide and Aldactone with liver cirrhosis-hopefully can resume.  Chronic diastolic CHF (congestive heart failure) (HCC) Volume status is stable.  Monitor closely intake output Daily weight.  Depression with anxiety Mood stable continue as multiple regimen with bedtime Seroquel, Paxil, Xanax  Generalized weakness See #1  Multiple myeloma (HCC) History, labs stable.  Outpatient follow-up advised  Type 2 diabetes mellitus without complication, without long-term current use of insulin (HCC) Blood sugar is stable continue to hold metformin and keep on sliding scale insulin with low-carb diet.   Recent Labs  Lab 01/09/22 0611 01/09/22 0726  GLUCAP 91 84    Obesity (BMI 30-39.9) BMI 30.9 will benefit with weight loss.  Pressure injury of skin Continue wound care offloading for left buttock wound as below Pressure Injury 01/08/22 Buttocks Left Stage 1 -  Intact skin with non-blanchable redness of a localized area usually over a bony prominence. reddened (Active)  01/08/22 1840  Location: Buttocks  Location Orientation: Left  Staging: Stage 1 -  Intact skin  with non-blanchable redness of a  localized area usually over a bony prominence.  Wound Description (Comments): reddened  Present on Admission: Yes     Pressure Injury 01/08/22 Buttocks Right Stage 1 -  Intact skin with non-blanchable redness of a localized area usually over a bony prominence. reddened (Active)  01/08/22 1840  Location: Buttocks  Location Orientation: Right  Staging: Stage 1 -  Intact skin with non-blanchable redness of a localized area usually over a bony prominence.  Wound Description (Comments): reddened  Present on Admission: Yes       DVT prophylaxis: enoxaparin (LOVENOX) injection 40 mg Start: 01/08/22 2200 Code Status:   Code Status: DNR Family Communication: plan of care discussed with patient at bedside.  Disposition: Currently not medically stable for discharge. Status is: Inpatient Remains inpatient appropriate because: Ongoing management of acute encephalopathy generalized weakness deconditioning. Objective: Vitals last 24 hrs: Vitals:   01/08/22 2025 01/09/22 0007 01/09/22 0532 01/09/22 0729  BP: (!) 125/57 129/64 (!) 112/45 139/69  Pulse: 94 88 84 93  Resp: '18 18 16 20  ' Temp: 97.8 F (36.6 C) 97.8 F (36.6 C) 97.7 F (36.5 C) 97.7 F (36.5 C)  TempSrc: Oral Oral Oral Oral  SpO2: 93% 96% 96% 98%  Weight:      Height:       Weight change:   Physical Examination: General exam: AA oriented to self current place people but not to time,older than stated age, weak appearing. HEENT:Oral mucosa moist, Ear/Nose WNL grossly, dentition normal. Respiratory system: bilaterally diminished BS, no use of accessory muscle Cardiovascular system: S1 & S2 +, No JVD,. Gastrointestinal system: Abdomen soft,NT,ND, BS+ Nervous System:Alert, awake, moving extremities and grossly nonfocal Extremities: LE edema +,distal peripheral pulses palpable.  Skin: No rashes,no icterus. MSK: Normal muscle bulk,tone, power  Medications reviewed:  Scheduled Meds:  (feeding supplement) PROSource Plus  30  mL Oral TID BM   aspirin EC  81 mg Oral Daily   calcium-vitamin D  1 tablet Oral BID   enoxaparin (LOVENOX) injection  40 mg Subcutaneous Q24H   feeding supplement  237 mL Oral BID BM   lactulose  30 g Oral TID   mouth rinse  15 mL Mouth Rinse BID   midodrine  2.5 mg Oral TID WC   multivitamin with minerals  1 tablet Oral Daily   pantoprazole  40 mg Oral Daily   PARoxetine  10 mg Oral Daily   pravastatin  40 mg Oral q1800   QUEtiapine  25 mg Oral QHS   sodium chloride flush  3 mL Intravenous Q12H   spironolactone  25 mg Oral Daily   tamsulosin  0.4 mg Oral Daily   torsemide  20 mg Oral Daily   Continuous Infusions:  sodium chloride     cefTRIAXone (ROCEPHIN)  IV Stopped (01/08/22 1341)    Pressure Injury 01/08/22 Buttocks Left Stage 1 -  Intact skin with non-blanchable redness of a localized area usually over a bony prominence. reddened (Active)  01/08/22 1840  Location: Buttocks  Location Orientation: Left  Staging: Stage 1 -  Intact skin with non-blanchable redness of a localized area usually over a bony prominence.  Wound Description (Comments): reddened  Present on Admission: Yes     Pressure Injury 01/08/22 Buttocks Right Stage 1 -  Intact skin with non-blanchable redness of a localized area usually over a bony prominence. reddened (Active)  01/08/22 1840  Location: Buttocks  Location Orientation: Right  Staging: Stage 1 -  Intact skin  with non-blanchable redness of a localized area usually over a bony prominence.  Wound Description (Comments): reddened  Present on Admission: Yes   Diet Order             Diet heart healthy/carb modified Room service appropriate? Yes; Fluid consistency: Thin  Diet effective now                            Intake/Output Summary (Last 24 hours) at 01/09/2022 0941 Last data filed at 01/09/2022 0919 Gross per 24 hour  Intake 103 ml  Output 700 ml  Net -597 ml   Net IO Since Admission: -597 mL [01/09/22 0941]  Wt Readings  from Last 3 Encounters:  01/08/22 87 kg  12/30/21 87 kg  12/28/21 87 kg     Unresulted Labs (From admission, onward)     Start     Ordered   2022/01/18 0500  Creatinine, serum  (enoxaparin (LOVENOX)    CrCl < 30 ml/min)  Weekly,   STAT     Comments: while on enoxaparin therapy.    01/08/22 1141   01/10/22 0500  CBC with Differential/Platelet  Tomorrow morning,   R       Question:  Specimen collection method  Answer:  Lab=Lab collect   01/09/22 0806   01/10/22 0500  CBC  Tomorrow morning,   R       Question:  Specimen collection method  Answer:  Lab=Lab collect   01/09/22 0806   01/10/22 0500  Lactic acid, plasma  Tomorrow morning,   R       Question:  Specimen collection method  Answer:  Lab=Lab collect   01/09/22 0807          Data Reviewed: I have personally reviewed following labs and imaging studies CBC: Recent Labs  Lab 01/08/22 0809  WBC 4.6  NEUTROABS 3.0  HGB 10.2*  HCT 31.4*  MCV 115.4*  PLT 629   Basic Metabolic Panel: Recent Labs  Lab 01/08/22 0809 01/09/22 0811  NA 141 146*  K 4.0 3.4*  CL 106 108  CO2 24 27  GLUCOSE 91 88  BUN 49* 43*  CREATININE 2.03* 1.70*  CALCIUM 8.3* 7.9*   GFR: Estimated Creatinine Clearance: 37.6 mL/min (A) (by C-G formula based on SCr of 1.7 mg/dL (H)). Liver Function Tests: Recent Labs  Lab 01/08/22 0809 01/09/22 0811  AST 58* 40  ALT 5 7  ALKPHOS 61 53  BILITOT 2.4* 1.6*  PROT 8.1 6.9  ALBUMIN 2.6* 2.2*   No results for input(s): LIPASE, AMYLASE in the last 168 hours. Recent Labs  Lab 01/08/22 0834  AMMONIA 47*   Coagulation Profile: Recent Labs  Lab 01/08/22 0809  INR 1.3*   Cardiac Enzymes: No results for input(s): CKTOTAL, CKMB, CKMBINDEX, TROPONINI in the last 168 hours. BNP (last 3 results) No results for input(s): PROBNP in the last 8760 hours. HbA1C: No results for input(s): HGBA1C in the last 72 hours. CBG: Recent Labs  Lab 01/09/22 0611 01/09/22 0726  GLUCAP 91 84   Lipid  Profile: No results for input(s): CHOL, HDL, LDLCALC, TRIG, CHOLHDL, LDLDIRECT in the last 72 hours. Thyroid Function Tests: No results for input(s): TSH, T4TOTAL, FREET4, T3FREE, THYROIDAB in the last 72 hours. Anemia Panel: No results for input(s): VITAMINB12, FOLATE, FERRITIN, TIBC, IRON, RETICCTPCT in the last 72 hours. Sepsis Labs: Recent Labs  Lab 01/08/22 0834 01/08/22 1104  LATICACIDVEN 2.5* 2.1*  Recent Results (from the past 240 hour(s))  Resp Panel by RT-PCR (Flu A&B, Covid) Nasopharyngeal Swab     Status: None   Collection Time: 01/08/22  8:34 AM   Specimen: Nasopharyngeal Swab; Nasopharyngeal(NP) swabs in vial transport medium  Result Value Ref Range Status   SARS Coronavirus 2 by RT PCR NEGATIVE NEGATIVE Final    Comment: (NOTE) SARS-CoV-2 target nucleic acids are NOT DETECTED.  The SARS-CoV-2 RNA is generally detectable in upper respiratory specimens during the acute phase of infection. The lowest concentration of SARS-CoV-2 viral copies this assay can detect is 138 copies/mL. A negative result does not preclude SARS-Cov-2 infection and should not be used as the sole basis for treatment or other patient management decisions. A negative result may occur with  improper specimen collection/handling, submission of specimen other than nasopharyngeal swab, presence of viral mutation(s) within the areas targeted by this assay, and inadequate number of viral copies(<138 copies/mL). A negative result must be combined with clinical observations, patient history, and epidemiological information. The expected result is Negative.  Fact Sheet for Patients:  EntrepreneurPulse.com.au  Fact Sheet for Healthcare Providers:  IncredibleEmployment.be  This test is no t yet approved or cleared by the [place] FDA and  has been authorized for detection and/or diagnosis of SARS-CoV-2 by FDA under an Emergency Use Authorization (EUA). This  EUA will remain  in effect (meaning this test can be used) for the duration of the COVID-19 declaration under Section 564(b)(1) of the Act, 21 U.S.C.section 360bbb-3(b)(1), unless the authorization is terminated  or revoked sooner.       Influenza A by PCR NEGATIVE NEGATIVE Final   Influenza B by PCR NEGATIVE NEGATIVE Final    Comment: (NOTE) The Xpert Xpress SARS-CoV-2/FLU/RSV plus assay is intended as an aid in the diagnosis of influenza from Nasopharyngeal swab specimens and should not be used as a sole basis for treatment. Nasal washings and aspirates are unacceptable for Xpert Xpress SARS-CoV-2/FLU/RSV testing.  Fact Sheet for Patients: EntrepreneurPulse.com.au  Fact Sheet for Healthcare Providers: IncredibleEmployment.be  This test is not yet approved or cleared by the [place] FDA and has been authorized for detection and/or diagnosis of SARS-CoV-2 by FDA under an Emergency Use Authorization (EUA). This EUA will remain in effect (meaning this test can be used) for the duration of the COVID-19 declaration under Section 564(b)(1) of the Act, 21 U.S.C. section 360bbb-3(b)(1), unless the authorization is terminated or revoked.  Performed at [facility], [address] [place], [place] [address]   Blood Culture (routine x 2)     Status: None (Preliminary result)   Collection Time: 01/08/22  8:34 AM   Specimen: BLOOD  Result Value Ref Range Status   Specimen Description BLOOD LEFT ANTECUBITAL  Final   Special Requests   Final    BOTTLES DRAWN AEROBIC AND ANAEROBIC Blood Culture results may not be optimal due to an inadequate volume of blood received in culture bottles   Culture   Final    NO GROWTH < 24 HOURS Performed at [facility], [address], [place], [place] [address]    Report Status PENDING  Incomplete  Blood Culture (routine x 2)     Status: None (Preliminary result)   Collection Time: 01/08/22   7:28 PM   Specimen: BLOOD  Result Value Ref Range Status   Specimen Description BLOOD BLOOD LEFT WRIST  Final   Special Requests   Final    BOTTLES DRAWN AEROBIC AND ANAEROBIC Blood Culture  adequate volume   Culture   Final    NO GROWTH < 12 HOURS Performed at [facility], [address] [place], [place] [address]    Report Status PENDING  Incomplete    Antimicrobials: Anti-infectives (From admission, onward)    Start     Dose/Rate Route Frequency Ordered Stop   01/08/22 1200  cefTRIAXone (ROCEPHIN) 2 g in sodium chloride 0.9 % 100 mL IVPB        2 g 200 mL/hr over 30 Minutes Intravenous Every 24 hours 01/08/22 1141        Culture/Microbiology    Component Value Date/Time   SDES BLOOD BLOOD LEFT WRIST 01/08/2022 1928   SPECREQUEST  01/08/2022 1928    BOTTLES DRAWN AEROBIC AND ANAEROBIC Blood Culture adequate volume   CULT  01/08/2022 1928    NO GROWTH < 12 HOURS Performed at [facility], [address], [place], [place] [address]    REPTSTATUS PENDING 01/08/2022 1928    Other culture-see note  Radiology Studies: CT HEAD WO CONTRAST (5MM)  Result Date: 01/08/2022 CLINICAL DATA:  Mental status change EXAM: CT HEAD WITHOUT CONTRAST TECHNIQUE: Contiguous axial images were obtained from the base of the skull through the vertex without intravenous contrast. RADIATION DOSE REDUCTION: This exam was performed according to the departmental dose-optimization program which includes automated exposure control, adjustment of the mA and/or kV according to patient size and/or use of iterative reconstruction technique. COMPARISON:  12/01/2021 FINDINGS: Brain: Mild atrophy. No evidence of acute infarction, hemorrhage, hydrocephalus, extra-axial collection or mass lesion/mass effect. Vascular: Atherosclerotic and physiologic intracranial calcifications. Skull: Normal. Negative for fracture or focal lesion. Sinuses/Orbits: No acute finding. Other: Decrease in right frontal  scalp soft tissue swelling. IMPRESSION: Negative for bleed orother acute intracranial process Electronically Signed   By: Lucrezia Europe M.D.   On: 01/08/2022 09:55   MR BRAIN WO CONTRAST  Result Date: 01/08/2022 CLINICAL DATA:  Transient ischemic attack. EXAM: MRI HEAD WITHOUT CONTRAST TECHNIQUE: Multiplanar, multiecho pulse sequences of the brain and surrounding structures were obtained without intravenous contrast. COMPARISON:  Head CT January 08, 2022 FINDINGS: The study is degraded by motion and patient positioning in the scanner. Brain: No acute infarction, hemorrhage, hydrocephalus, extra-axial collection or mass lesion. Scattered foci of T2 hyperintensity are seen within the white matter of the cerebral hemispheres, nonspecific, most likely related to chronic small vessel ischemia. Vascular: Normal flow voids. Skull and upper cervical spine: Normal marrow signal. Sinuses/Orbits: Negative. Other: None. IMPRESSION: 1. No acute intracranial abnormality. 2. Mild chronic microvascular ischemic changes of the white matter. Electronically Signed   By: Pedro Earls M.D.   On: 01/08/2022 12:55   DG Chest Port 1 View  Result Date: 01/08/2022 CLINICAL DATA:  Questionable sepsis - evaluate for abnormality EXAM: PORTABLE CHEST 1 VIEW COMPARISON:  12/28/2021 FINDINGS: Chronic interstitial changes. Chronic trace of pleural effusion. Left basilar atelectasis/scarring. Stable cardiomediastinal contours. IMPRESSION: Chronic interstitial changes without definite acute abnormality. Electronically Signed   By: Macy Mis M.D.   On: 01/08/2022 08:29     LOS: 1 day   Antonieta Pert, MD [facility]  01/09/2022, 9:41 AM

## 2022-01-10 DIAGNOSIS — K7682 Hepatic encephalopathy: Secondary | ICD-10-CM | POA: Diagnosis not present

## 2022-01-10 LAB — CBC WITH DIFFERENTIAL/PLATELET
Abs Immature Granulocytes: 0.02 10*3/uL (ref 0.00–0.07)
Basophils Absolute: 0 10*3/uL (ref 0.0–0.1)
Basophils Relative: 1 %
Eosinophils Absolute: 0.5 10*3/uL (ref 0.0–0.5)
Eosinophils Relative: 12 %
HCT: 27.4 % — ABNORMAL LOW (ref 39.0–52.0)
Hemoglobin: 9 g/dL — ABNORMAL LOW (ref 13.0–17.0)
Immature Granulocytes: 1 %
Lymphocytes Relative: 16 %
Lymphs Abs: 0.7 10*3/uL (ref 0.7–4.0)
MCH: 38.1 pg — ABNORMAL HIGH (ref 26.0–34.0)
MCHC: 32.8 g/dL (ref 30.0–36.0)
MCV: 116.1 fL — ABNORMAL HIGH (ref 80.0–100.0)
Monocytes Absolute: 0.7 10*3/uL (ref 0.1–1.0)
Monocytes Relative: 16 %
Neutro Abs: 2.2 10*3/uL (ref 1.7–7.7)
Neutrophils Relative %: 54 %
Platelets: 132 10*3/uL — ABNORMAL LOW (ref 150–400)
RBC: 2.36 MIL/uL — ABNORMAL LOW (ref 4.22–5.81)
RDW: 16.7 % — ABNORMAL HIGH (ref 11.5–15.5)
WBC: 4.1 10*3/uL (ref 4.0–10.5)
nRBC: 0 % (ref 0.0–0.2)

## 2022-01-10 LAB — GLUCOSE, CAPILLARY
Glucose-Capillary: 106 mg/dL — ABNORMAL HIGH (ref 70–99)
Glucose-Capillary: 124 mg/dL — ABNORMAL HIGH (ref 70–99)
Glucose-Capillary: 149 mg/dL — ABNORMAL HIGH (ref 70–99)

## 2022-01-10 LAB — LACTIC ACID, PLASMA: Lactic Acid, Venous: 2 mmol/L (ref 0.5–1.9)

## 2022-01-10 MED ORDER — SODIUM CHLORIDE 0.9 % IV BOLUS
500.0000 mL | Freq: Once | INTRAVENOUS | Status: AC
  Administered 2022-01-11: 500 mL via INTRAVENOUS

## 2022-01-10 MED ORDER — BOOST / RESOURCE BREEZE PO LIQD CUSTOM
1.0000 | Freq: Three times a day (TID) | ORAL | Status: DC
  Administered 2022-01-10 – 2022-01-14 (×10): 1 via ORAL

## 2022-01-10 NOTE — Progress Notes (Signed)
PROGRESS NOTE  Luis [name]. BTD:[id number] DOB: 07-06-1946 DOA: 01/08/2022 PCP: Tracie Harrier, MD  HPI/Recap of past 24 hours: 76 y.o. male with  hx of CAD S/P CABG, history of multiple myeloma, type II diabetes mellitus with stage IIIB chronic kidney disease, hypertension, anxiety and depression, recent hospitalization for MSSA sepsis from skin and soft tissue infection (completed IV antibiotic therapy with cefazolin on 12/31/21) presented for evaluation of altered mental status from nursing facility.  Associated with a productive cough and occasional yellow phlegm.  Work-up revealed hepatic encephalopathy with elevated ammonia level and elevated liver chemistries.  He was placed on lactulose with improvement of his symptomatology.  01/10/22: Patient was seen and examined at his bedside.  He is alert and oriented x3.  Drowsy, but easily arousable to voices and responding to questions appropriately.  Assessment/Plan: Principal Problem:   Hepatic encephalopathy Active Problems:   Cirrhosis (HCC)   Acute metabolic encephalopathy   Lactic acidosis   Stage 3b chronic kidney disease (CKD) (HCC)   CAD (coronary artery disease) of artery bypass graft   Chronic diastolic CHF (congestive heart failure) (HCC)   Depression with anxiety   Generalized weakness   Multiple myeloma (HCC)   Type 2 diabetes mellitus without complication, without long-term current use of insulin (HCC)   Obesity (BMI 30-39.9)   Pressure injury of skin  Resolved hepatic encephalopathy Resolved acute metabolic encephalopathy  Persistent generalized weakness/debility: PT OT assessment recommending SNF. TOC assisting with SNF placement.   Cirrhosis ([place]) Alcoholic liver cirrhosis with ascites and recent paracentesis: See above, mildly distended abdomen with lower leg edema Continue home Aldactone and torsemide.   Continue home midodrine.   Lactic acidosis, likely multifactorial in the setting of malignancy and  cirrhosis Likely from decreased clearance with her liver cirrhosis.  Anticipate slow clearance we will repeat in a.m. to ensure improvement   Stage 3b chronic kidney disease (CKD) (HCC) Recent baseline creatinine was around 1.7-1.9.  Creatinine back to baseline.  Continue to avoid nephrotoxic agents, dehydration and hypotension Closely monitor urine output.   CAD (coronary artery disease) of artery bypass graft Denies any anginal symptoms at the time of this visit. Continue on aspirin and statin Continue p.o. Lopressor 12.5 mg twice daily.   Chronic diastolic CHF (congestive heart failure) (HCC) Volume status stable on exam. Net I&O -1.0 L. Continue strict I's and O's and daily weight.   Chronic anxiety/depression Mood stable continue as multiple regimen with bedtime Seroquel, Paxil, Xanax   Multiple myeloma (HCC) History, labs stable.  Outpatient follow-up advised   Prediabetes Hemoglobin A1c 6.1 on 12/09/2021. Blood sugar is stable continue to hold metformin and keep on sliding scale insulin with low-carb diet.   Avoid hypoglycemia.  Obesity (BMI 30-39.9) BMI 30.9 will benefit with weight loss. Recommend weight loss outpatient regular physical activity and healthy dieting.   Pressure injury of skin Continue wound care offloading for left buttock wound as below Pressure Injury 01/08/22 Buttocks Left Stage 1 -  Intact skin with non-blanchable redness of a localized area usually over a bony prominence. reddened (Active)  01/08/22 1840  Location: Buttocks  Location Orientation: Left  Staging: Stage 1 -  Intact skin with non-blanchable redness of a localized area usually over a bony prominence.  Wound Description (Comments): reddened  Present on Admission: Yes     Pressure Injury 01/08/22 Buttocks Right Stage 1 -  Intact skin with non-blanchable redness of a localized area usually over a bony prominence. reddened (Active)  01/08/22 1840  Location: Buttocks  Location  Orientation: Right  Staging: Stage 1 -  Intact skin with non-blanchable redness of a localized area usually over a bony prominence.  Wound Description (Comments): reddened  Present on Admission: Yes            DVT prophylaxis: enoxaparin (LOVENOX) injection 40 mg Start: 01/08/22 2200 Code Status:   Code Status: DNR Family Communication: None at bedside.   Disposition: Discharge to SNF once a bed is available. Status is: Inpatient Remains inpatient appropriate because: Medically cleared for discharge.   Status is: Inpatient Patient requires at least 2 midnights for further evaluation and treatment of present condition.            Objective: Vitals:   01/10/22 0358 01/10/22 0808 01/10/22 1115 01/10/22 1118  BP: (!) 113/56 (!) 113/56 (!) 94/53 (!) 89/48  Pulse: 73 (!) 59 (!) 59 (!) 55  Resp: '20 19 16   ' Temp: (!) 97.4 F (36.3 C) 97.9 F (36.6 C) (!) 97.5 F (36.4 C)   TempSrc: Oral     SpO2: 98% 98% 99%   Weight:      Height:        Intake/Output Summary (Last 24 hours) at 01/10/2022 1207 Last data filed at 01/10/2022 2979 Gross per 24 hour  Intake 100 ml  Output 575 ml  Net -475 ml   Filed Weights   01/08/22 0801  Weight: 87 kg    Exam:  General: 76 y.o. year-old male well developed well nourished in no acute distress.  Alert and oriented x3. Cardiovascular: Regular rate and rhythm with no rubs or gallops.  No thyromegaly or JVD noted.   Respiratory: Clear to auscultation with no wheezes or rales. Good inspiratory effort. Abdomen: Soft nontender nondistended with normal bowel sounds x4 quadrants. Musculoskeletal: No lower extremity edema. 2/4 pulses in all 4 extremities. Skin: No ulcerative lesions noted or rashes, healed scalp wound from picking at it. Psychiatry: Mood is appropriate for condition and setting   Data Reviewed: CBC: Recent Labs  Lab 01/08/22 0809 01/10/22 0639  WBC 4.6 4.1  NEUTROABS 3.0 2.2  HGB 10.2* 9.0*  HCT 31.4* 27.4*   MCV 115.4* 116.1*  PLT 178 892*   Basic Metabolic Panel: Recent Labs  Lab 01/08/22 0809 01/09/22 0811  NA 141 146*  K 4.0 3.4*  CL 106 108  CO2 24 27  GLUCOSE 91 88  BUN 49* 43*  CREATININE 2.03* 1.70*  CALCIUM 8.3* 7.9*   GFR: Estimated Creatinine Clearance: 37.6 mL/min (A) (by C-G formula based on SCr of 1.7 mg/dL (H)). Liver Function Tests: Recent Labs  Lab 01/08/22 0809 01/09/22 0811  AST 58* 40  ALT 5 7  ALKPHOS 61 53  BILITOT 2.4* 1.6*  PROT 8.1 6.9  ALBUMIN 2.6* 2.2*   No results for input(s): LIPASE, AMYLASE in the last 168 hours. Recent Labs  Lab 01/08/22 0834  AMMONIA 47*   Coagulation Profile: Recent Labs  Lab 01/08/22 0809  INR 1.3*   Cardiac Enzymes: No results for input(s): CKTOTAL, CKMB, CKMBINDEX, TROPONINI in the last 168 hours. BNP (last 3 results) No results for input(s): PROBNP in the last 8760 hours. HbA1C: No results for input(s): HGBA1C in the last 72 hours. CBG: Recent Labs  Lab 01/09/22 0726 01/09/22 1119 01/09/22 1641 01/10/22 0804 01/10/22 1116  GLUCAP 84 103* 105* 106* 124*   Lipid Profile: No results for input(s): CHOL, HDL, LDLCALC, TRIG, CHOLHDL, LDLDIRECT in the last 72 hours. Thyroid  Function Tests: No results for input(s): TSH, T4TOTAL, FREET4, T3FREE, THYROIDAB in the last 72 hours. Anemia Panel: No results for input(s): VITAMINB12, FOLATE, FERRITIN, TIBC, IRON, RETICCTPCT in the last 72 hours. Urine analysis:    Component Value Date/Time   COLORURINE YELLOW (A) 01/08/2022 0834   APPEARANCEUR HAZY (A) 01/08/2022 0834   APPEARANCEUR Clear 08/07/2013 0102   LABSPEC 1.011 01/08/2022 0834   LABSPEC 1.023 08/07/2013 0102   PHURINE 5.0 01/08/2022 0834   GLUCOSEU NEGATIVE 01/08/2022 0834   GLUCOSEU 50 mg/dL 08/07/2013 0102   HGBUR NEGATIVE 01/08/2022 0834   BILIRUBINUR NEGATIVE 01/08/2022 0834   BILIRUBINUR Negative 08/07/2013 0102   KETONESUR NEGATIVE 01/08/2022 0834   PROTEINUR NEGATIVE 01/08/2022 0834    NITRITE NEGATIVE 01/08/2022 0834   LEUKOCYTESUR NEGATIVE 01/08/2022 0834   LEUKOCYTESUR Negative 08/07/2013 0102   Sepsis Labs: '@LABRCNTIP' (procalcitonin:4,lacticidven:4)  ) Recent Results (from the past 240 hour(s))  Resp Panel by RT-PCR (Flu A&B, Covid) Nasopharyngeal Swab     Status: None   Collection Time: 01/08/22  8:34 AM   Specimen: Nasopharyngeal Swab; Nasopharyngeal(NP) swabs in vial transport medium  Result Value Ref Range Status   SARS Coronavirus 2 by RT PCR NEGATIVE NEGATIVE Final    Comment: (NOTE) SARS-CoV-2 target nucleic acids are NOT DETECTED.  The SARS-CoV-2 RNA is generally detectable in upper respiratory specimens during the acute phase of infection. The lowest concentration of SARS-CoV-2 viral copies this assay can detect is 138 copies/mL. A negative result does not preclude SARS-Cov-2 infection and should not be used as the sole basis for treatment or other patient management decisions. A negative result may occur with  improper specimen collection/handling, submission of specimen other than nasopharyngeal swab, presence of viral mutation(s) within the areas targeted by this assay, and inadequate number of viral copies(<138 copies/mL). A negative result must be combined with clinical observations, patient history, and epidemiological information. The expected result is Negative.  Fact Sheet for Patients:  EntrepreneurPulse.com.au  Fact Sheet for Healthcare Providers:  IncredibleEmployment.be  This test is no t yet approved or cleared by the [place] FDA and  has been authorized for detection and/or diagnosis of SARS-CoV-2 by FDA under an Emergency Use Authorization (EUA). This EUA will remain  in effect (meaning this test can be used) for the duration of the COVID-19 declaration under Section 564(b)(1) of the Act, 21 U.S.C.section 360bbb-3(b)(1), unless the authorization is terminated  or revoked sooner.        Influenza A by PCR NEGATIVE NEGATIVE Final   Influenza B by PCR NEGATIVE NEGATIVE Final    Comment: (NOTE) The Xpert Xpress SARS-CoV-2/FLU/RSV plus assay is intended as an aid in the diagnosis of influenza from Nasopharyngeal swab specimens and should not be used as a sole basis for treatment. Nasal washings and aspirates are unacceptable for Xpert Xpress SARS-CoV-2/FLU/RSV testing.  Fact Sheet for Patients: EntrepreneurPulse.com.au  Fact Sheet for Healthcare Providers: IncredibleEmployment.be  This test is not yet approved or cleared by the [place] FDA and has been authorized for detection and/or diagnosis of SARS-CoV-2 by FDA under an Emergency Use Authorization (EUA). This EUA will remain in effect (meaning this test can be used) for the duration of the COVID-19 declaration under Section 564(b)(1) of the Act, 21 U.S.C. section 360bbb-3(b)(1), unless the authorization is terminated or revoked.  Performed at [facility], [address] [place], [place] [address]   Blood Culture (routine x 2)     Status: None (Preliminary result)   Collection Time: 01/08/22  8:34 AM   Specimen: BLOOD  Result Value Ref Range Status   Specimen Description BLOOD LEFT ANTECUBITAL  Final   Special Requests   Final    BOTTLES DRAWN AEROBIC AND ANAEROBIC Blood Culture results may not be optimal due to an inadequate volume of blood received in culture bottles   Culture   Final    NO GROWTH 2 DAYS Performed at [facility], [address], [place], [place] [address]    Report Status PENDING  Incomplete  Blood Culture (routine x 2)     Status: None (Preliminary result)   Collection Time: 01/08/22  7:28 PM   Specimen: BLOOD  Result Value Ref Range Status   Specimen Description BLOOD BLOOD LEFT WRIST  Final   Special Requests   Final    BOTTLES DRAWN AEROBIC AND ANAEROBIC Blood Culture adequate volume   Culture   Final    NO GROWTH 2  DAYS Performed at [facility], [address], [place], [place] [address]    Report Status PENDING  Incomplete      Studies: No results found.  Scheduled Meds:  (feeding supplement) PROSource Plus  30 mL Oral TID BM   aspirin EC  81 mg Oral Daily   calcium-vitamin D  1 tablet Oral BID   enoxaparin (LOVENOX) injection  40 mg Subcutaneous Q24H   feeding supplement  237 mL Oral BID BM   lactulose  30 g Oral TID   mouth rinse  15 mL Mouth Rinse BID   metoprolol tartrate  12.5 mg Oral BID   midodrine  2.5 mg Oral TID WC   multivitamin with minerals  1 tablet Oral Daily   pantoprazole  40 mg Oral Daily   PARoxetine  10 mg Oral Daily   pravastatin  40 mg Oral q1800   QUEtiapine  25 mg Oral QHS   sodium chloride flush  3 mL Intravenous Q12H   spironolactone  25 mg Oral Daily   tamsulosin  0.4 mg Oral Daily   torsemide  20 mg Oral Daily    Continuous Infusions:  sodium chloride     cefTRIAXone (ROCEPHIN)  IV 2 g (01/09/22 1251)     LOS: 2 days     Kayleen Memos, MD [facility] Pager [phone]  If 7PM-7AM, please contact night-coverage [email or web address] Password [email or web address] 01/10/2022, 12:07 PM

## 2022-01-10 NOTE — Care Management (Signed)
Cross coverage note ?Called for hypotension with SBP 70 on MAP of 55, pulse 66 patient otherwise resting comfortably.  Chart reviewed.  Treated for hepatic encephalopathy with lactulose and currently on diuretics.  Suspect low blood pressure secondary to fluid losses from diarrhea and diuretics.  Will give gentle IV bolus of 500 NS and monitor as patient is not otherwise symptomatic.  If no significant improvement may consider rebolusing or starting midodrine. ? ?2:30am ?BP still low. Mentating well.  Rebolused 580ms NS and given midodrine. Continuing to monitor ?

## 2022-01-10 NOTE — Progress Notes (Signed)
Speech therapist consulted due to concern for dysphagia with solids.  Aspiration precautions in place.  Diet consistency changed to dysphagia 3.  We will continue to closely monitor and treat as indicated. ?

## 2022-01-10 NOTE — Progress Notes (Addendum)
Initial Nutrition Assessment ? ?DOCUMENTATION CODES:  ? ?Obesity unspecified ? ?INTERVENTION:  ?- Diet liberalization from heart healthy/carb modified to regular diet to optimize nutritional adequacy ?- Discontinue Ensure and ProSource ?- Boost Breeze po TID, each supplement provides 250 kcal and 9 grams of protein ?- Continue MVI with minerals daily ?- Recommend SLP evaluation to assess coughing while eating meals; spoke with MD about placing consult ? ?NUTRITION DIAGNOSIS:  ? ?Increased nutrient needs related to acute illness as evidenced by estimated needs. ? ?GOAL:  ? ?Patient will meet greater than or equal to 90% of their needs ? ?MONITOR:  ? ?PO intake, Supplement acceptance, Diet advancement, Weight trends, Labs ? ?REASON FOR ASSESSMENT:  ? ?Malnutrition Screening Tool ?  ? ?ASSESSMENT:  ? ?Pt admitted from SNF with AMS secondary to hepatic encephalopathy. PMH includes CAD s/p CABG, h/o multiple myeloma, T2DM with stage III CKD, HTN, anxiety and depression. Recently admitted for MSSA sepsis from skin and soft tissue infection. ? ?Per MD, pt is medically stable for d/c pending SNF placement. ? ?Unable to reach pt by phone. Noted pt refusing ProSource and Ensure as this is causing nausea and diarrhea. Per previous RD notes, pt enjoys fruit juice. Will order Boost Breeze and adjust supplements as appropriate at follow up. ? ?Spoke with RN who states pt is eating well. Concerns of coughing while eating meals. Reached out to MD for SLP evaluation. ? ?Per review of chart, weights have remained stable between 87.1-88.9 kg.  ? ?Noted pt with a history of malnutrition. Unable to confirm ongoing malnutrition at this time with limited nutrition history and physical exam. Given glucose control and history of malnutrition, will liberalize diet to regular to provide more menu options to enhance nutritional adequacy. ? ?Medications: calcium-vitamin D, lactulose, MVI, protonix, torsemide, IV abx ? ?Labs: sodium 146,  potassium 3.4, BUN 43, Cr 1.70, ammonia 47,   ?  HgbA1c 6.1%, CBG's 84-124 x 24 hours ? ?NUTRITION - FOCUSED PHYSICAL EXAM: ?RD working remotely. Deferred to follow up. ? ?Diet Order:   ?Diet Order   ? ?       ?  Diet regular Room service appropriate? Yes; Fluid consistency: Thin  Diet effective now       ?  ? ?  ?  ? ?  ? ? ?EDUCATION NEEDS:  ? ?No education needs have been identified at this time ? ?Skin:  Skin Assessment: Skin Integrity Issues: ?Stage I: L/R buttock ? ?Last BM:  3/3 (type 7) ? ?Height:  ? ?Ht Readings from Last 1 Encounters:  ?01/08/22 '5\' 6"'  (1.676 m)  ? ? ?Weight:  ? ?Wt Readings from Last 1 Encounters:  ?01/08/22 87 kg  ? ? ?Ideal Body Weight:  61.8 kg ? ?BMI:  Body mass index is 30.96 kg/m?. ? ?Estimated Nutritional Needs:  ? ?Kcal:  1900-2100 ? ?Protein:  95-110g ? ?Fluid:  >/=1.9L ? ?Clayborne Dana, RDN, LDN ?Clinical Nutrition ?

## 2022-01-11 ENCOUNTER — Inpatient Hospital Stay: Payer: Medicare Other

## 2022-01-11 DIAGNOSIS — K7682 Hepatic encephalopathy: Secondary | ICD-10-CM | POA: Diagnosis not present

## 2022-01-11 LAB — COMPREHENSIVE METABOLIC PANEL
ALT: 8 U/L (ref 0–44)
AST: 41 U/L (ref 15–41)
Albumin: 1.9 g/dL — ABNORMAL LOW (ref 3.5–5.0)
Alkaline Phosphatase: 47 U/L (ref 38–126)
Anion gap: 10 (ref 5–15)
BUN: 44 mg/dL — ABNORMAL HIGH (ref 8–23)
CO2: 25 mmol/L (ref 22–32)
Calcium: 7.5 mg/dL — ABNORMAL LOW (ref 8.9–10.3)
Chloride: 104 mmol/L (ref 98–111)
Creatinine, Ser: 2.05 mg/dL — ABNORMAL HIGH (ref 0.61–1.24)
GFR, Estimated: 33 mL/min — ABNORMAL LOW (ref >60)
Glucose, Bld: 97 mg/dL (ref 70–99)
Potassium: 3.5 mmol/L (ref 3.5–5.1)
Sodium: 139 mmol/L (ref 135–145)
Total Bilirubin: 0.6 mg/dL (ref 0.3–1.2)
Total Protein: 6.2 g/dL — ABNORMAL LOW (ref 6.5–8.1)

## 2022-01-11 LAB — GLUCOSE, CAPILLARY
Glucose-Capillary: 124 mg/dL — ABNORMAL HIGH (ref 70–99)
Glucose-Capillary: 169 mg/dL — ABNORMAL HIGH (ref 70–99)
Glucose-Capillary: 77 mg/dL (ref 70–99)
Glucose-Capillary: 88 mg/dL (ref 70–99)
Glucose-Capillary: 92 mg/dL (ref 70–99)
Glucose-Capillary: 98 mg/dL (ref 70–99)

## 2022-01-11 LAB — PHOSPHORUS: Phosphorus: 4.7 mg/dL — ABNORMAL HIGH (ref 2.5–4.6)

## 2022-01-11 LAB — MAGNESIUM: Magnesium: 1.2 mg/dL — ABNORMAL LOW (ref 1.7–2.4)

## 2022-01-11 MED ORDER — MIDODRINE HCL 5 MG PO TABS
10.0000 mg | ORAL_TABLET | Freq: Three times a day (TID) | ORAL | Status: DC
  Administered 2022-01-11 – 2022-01-14 (×11): 10 mg via ORAL
  Filled 2022-01-11 (×11): qty 2

## 2022-01-11 MED ORDER — MAGNESIUM SULFATE 2 GM/50ML IV SOLN
2.0000 g | Freq: Once | INTRAVENOUS | Status: AC
  Administered 2022-01-11: 2 g via INTRAVENOUS
  Filled 2022-01-11: qty 50

## 2022-01-11 MED ORDER — SODIUM CHLORIDE 0.9 % IV BOLUS
500.0000 mL | Freq: Once | INTRAVENOUS | Status: AC
  Administered 2022-01-11: 500 mL via INTRAVENOUS

## 2022-01-11 MED ORDER — LACTATED RINGERS IV SOLN: INTRAVENOUS | Status: AC

## 2022-01-11 NOTE — Care Management Important Message (Signed)
Important Message ? ?Patient Details  ?Name: Luis [name]. ?MRN: [id number] ?Date of Birth: 04-Sep-1946 ? ? ?Medicare Important Message Given:  N/A - LOS <3 / Initial given by admissions ? ? ? ? ?Juliann Pulse A [name] ?01/11/2022, 9:29 AM ?

## 2022-01-11 NOTE — Progress Notes (Signed)
Yellow mews. CN and MD, Prudy Feeler, notified. Fluid bolus ordered and administered. BP increased at completion of bolus from 70/46 to 80/48. 30 min post bolus BP of 72/41. MD H. Damita Dunnings made aware. Additional fluid bolus ordered w/ 10 mg midodrine. Continuing to monitor. ? 01/10/22 2342  ?Assess: MEWS Score  ?Temp (!) 97.5 ?F (36.4 ?C)  ?BP (!) 70/46  ?Pulse Rate 60  ?ECG Heart Rate 62  ?Resp (!) 21  ?SpO2 97 %  ?O2 Device Nasal Cannula  ?O2 Flow Rate (L/min) 2 L/min  ?Assess: MEWS Score  ?MEWS Temp 0  ?MEWS Systolic 2  ?MEWS Pulse 0  ?MEWS RR 1  ?MEWS LOC 0  ?MEWS Score 3  ?MEWS Score Color Yellow  ?Assess: if the MEWS score is Yellow or Red  ?Were vital signs taken at a resting state? Yes  ?Focused Assessment No change from prior assessment  ?Does the patient meet 2 or more of the SIRS criteria? No  ?MEWS guidelines implemented *See Row Information* Yes  ?Treat  ?MEWS Interventions Escalated (See documentation below)  ?Pain Scale 0-10  ?Pain Score Asleep  ?Take Vital Signs  ?Increase Vital Sign Frequency  Yellow: Q 2hr X 2 then Q 4hr X 2, if remains yellow, continue Q 4hrs  ?Escalate  ?MEWS: Escalate Yellow: discuss with charge nurse/RN and consider discussing with provider and RRT  ?Notify: Charge Nurse/RN  ?Name of Charge Nurse/RN Notified Earlie Server, RN  ?Date Charge Nurse/RN Notified 01/11/22  ?Time Charge Nurse/RN Notified 2344  ?Notify: Provider  ?Provider Name/Title Dr Prudy Feeler  ?Date Provider Notified 01/10/22  ?Time Provider Notified 2348  ?Notification Type  ?(secure chat)  ?Notification Reason Other (Comment) ?(yellow mews)  ?Provider response See new orders  ?Date of Provider Response 01/10/22  ?Time of Provider Response 2357  ?Document  ?Patient Outcome Other (Comment) ?(continuing to monitor)  ?Progress note created (see row info) Yes  ?Assess: SIRS CRITERIA  ?SIRS Temperature  0  ?SIRS Pulse 0  ?SIRS Respirations  1  ?SIRS WBC 0  ?SIRS Score Sum  1  ? ? . CBG also noted at 77, juice provided  with normalization of CBG results.   ?

## 2022-01-11 NOTE — Progress Notes (Signed)
PT Cancellation Note ? ?Patient Details ?Name: Luis [name]. ?MRN: [id number] ?DOB: 08-29-1946 ? ? ?Cancelled Treatment:    Reason Eval/Treat Not Completed: Fatigue/lethargy limiting ability to participate. Patient declines PT at this time. Will check back later if time allows.  ? ? ?[name] ?01/11/2022, 1:42 PM ?

## 2022-01-11 NOTE — Progress Notes (Signed)
?Progress Note ? ? ?Patient: Luis [name]. BCW:[id number] DOB: 05-02-1946 DOA: 01/08/2022     3 ?DOS: the patient was seen and examined on 01/11/2022 ?  ?Brief hospital course: ? 76 y.o. male with  hx of CAD S/P CABG, history of multiple myeloma, diabetes mellitus with stage III chronic kidney disease iiB- CREAT ~ 1.7-1.9 ( FEB 2023), hypertension, anxiety and depression, recent hospitalization for MSSA sepsis from skin and soft tissue infection (completed IV antibiotic therapy with cefazolin on 12/31/21) presented for evaluation of altered mental status from nursing facility.  Patient was also reporting very weak. ?In the ED, patient is awake alert oriented to person place but not to time, reported having cough occasional productive with yellow phlegm.  Vital stable afebrile, labs with CKD creatinine slightly elevated from baseline 2.0, AST 58 lactic acid in low 2s, anemia hemoglobin 10.2 g UA no evidence of UTI CT/MRI head chest x-ray -no acute finding.  Ammonia level at 47 total bili 2.4, placed on lactulose and admitted for further management. ? ?3/6: Overnight patient became hypotensive and received 1 L of IV bolus.  Also given an extra dose of midodrine.  Received morning torsemide and spironolactone before nursing staff was told to hold them due to mild worsening of creatinine.  Blood pressure remained borderline, giving 1 L of LR. ?Midodrine dose was increased to 10 mg 3 times a day. ?PT/OT is recommending rehab. ?Apparently only 3 days left for rehab.  ? Does not meet the criteria for inpatient hospice services as requested by son.  Most likely will be going back to [facility] with hospice. ?Also ordered abdominal ultrasound to see the amount of ascites, will consider paracentesis if needed. ? ? ?Assessment and Plan: ?* Hepatic encephalopathy ?Acute metabolic encephalopathy ?Generalized weakness/debility: ?Patient presenting with confusion worse from baseline along with generalized weakness, work-up so far  unremarkable for UTI pneumonia MRI brain and CT head no acute finding.  Likely multifactorial in the setting of hepatic encephalopathy, history of alcohol liver cirrhosis with recent paracentesis. ?-Appears to be at baseline from mental standpoint. ?-PT/OT is recommending SNF-patient has only 3 days left. ?-Might be more appropriate going back to his assisted living with hospice care. ?-TOC to look for options ? ?Acute metabolic encephalopathy ?See above ? 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  His home dose of torsemide and spironolactone was resumed but resulted in worsening hypotension. ?-Hold torsemide and spironolactone. ?-Abdominal ultrasound to see the quantity of cirrhosis-might need another paracentesis. ?-No sign of infection- we will stop ceftriaxone ? ?Lactic acidosis ?Likely from decreased clearance with her liver cirrhosis.  Anticipate slow clearance we will repeat in a.m. to ensure improvement ? ?Stage 3b chronic kidney disease (CKD) ([place]) ?Mild worsening of creatinine to 2.05.  Baseline around 1.9-1.8. ?Patient was restarted on diuretic and developed hypotension overnight. ?-Giving some gentle fluid ?-Holding diuretics ? ?CAD (coronary artery disease) of artery bypass graft ?No chest pain continue on aspirin and statin not on beta-blocker due to hypotension. On torsemide and Aldactone with liver cirrhosis-hopefully can resume. ? ?Chronic diastolic CHF (congestive heart failure) ([place]) ?Volume status is stable.  Monitor closely intake output Daily weight. ? ?Depression with anxiety ?Mood stable continue as multiple regimen with bedtime Seroquel, Paxil, Xanax ? ?Generalized weakness ?PT/OT are recommending SNF. ?-TOC to look for different options as he ran out of his SNF days ? ?Multiple myeloma ([place]) ?History, labs stable.  Outpatient follow-up advised ? ?  Type 2 diabetes mellitus without complication, without long-term  current use of insulin ([place]) ?Blood sugar is stable ?-continue to hold metformin  ?-Continue sliding scale insulin with low-carb diet.   ? ? ?Obesity (BMI 30-39.9) ?BMI 30.9 will benefit with weight loss. ? ?Pressure injury of skin ?Continue wound care offloading for left buttock wound as below ?Pressure Injury 01/08/22 Buttocks Left Stage 1 -  Intact skin with non-blanchable redness of a localized area usually over a bony prominence. reddened (Active)  ?01/08/22 1840  ?Location: Buttocks  ?Location Orientation: Left  ?Staging: Stage 1 -  Intact skin with non-blanchable redness of a localized area usually over a bony prominence.  ?Wound Description (Comments): reddened  ?Present on Admission: Yes  ?   ?Pressure Injury 01/08/22 Buttocks Right Stage 1 -  Intact skin with non-blanchable redness of a localized area usually over a bony prominence. reddened (Active)  ?01/08/22 1840  ?Location: Buttocks  ?Location Orientation: Right  ?Staging: Stage 1 -  Intact skin with non-blanchable redness of a localized area usually over a bony prominence.  ?Wound Description (Comments): reddened  ?Present on Admission: Yes  ? ? ? ? ? ? ?  ? ?Subjective: Patient was seen and examined today.  Denies any abdominal pain.  Having 2-3 bowel movements.  No nausea, vomiting or diarrhea.  Son at bedside. ? ?Physical Exam: ?Vitals:  ? 01/11/22 0543 01/11/22 0730 01/11/22 1149 01/11/22 1151  ?BP: (!) 93/49 103/63 (!) 83/56 (!) 84/62  ?Pulse: (!) 54 (!) 57 (!) 57 (!) 55  ?Resp:  19 18   ?Temp:  (!) 97.5 ?F (36.4 ?C) (!) 97.5 ?F (36.4 ?C)   ?TempSrc:      ?SpO2:  99% 98% 99%  ?Weight:      ?Height:      ? ?General.  Chronically ill-appearing elderly man, in no acute distress. ?Pulmonary.  Lungs clear bilaterally, normal respiratory effort. ?CV.  Regular rate and rhythm, no JVD, rub or murmur. ?Abdomen.  Soft, nontender, mildly distended, positive fluid wave, BS positive ?CNS.  Alert and oriented .  No focal neurologic deficit. ?Extremities.  No  edema, no cyanosis, pulses intact and symmetrical. ?Psychiatry.  Judgment and insight appears normal. ? ?Data Reviewed: ?Prior notes, labs and imaging reviewed. ? ?Family Communication: Discussed with son at bedside ? ?Disposition: ?Status is: Inpatient ?Remains inpatient appropriate because: Severity of illness ? ? Planned Discharge Destination: To be determined ? ?DVT prophylaxis.  Lovenox ? ?Time spent: 50 minutes ? ?This record has been created using Systems analyst. Errors have been sought and corrected,but may not always be located. Such creation errors do not reflect on the standard of care. ? ?Author: ?Lorella Nimrod, MD ?01/11/2022 3:53 PM ? ?For on call review [email or web address].  ?

## 2022-01-11 NOTE — Evaluation (Addendum)
Clinical/Bedside Swallow Evaluation Patient Details  Name: Luis [name]. MRN: [id number] Date of Birth: [birth year]  Today's Date: 01/11/2022 Time: SLP Start Time (ACUTE ONLY): 0930 SLP Stop Time (ACUTE ONLY): 0945 SLP Time Calculation (min) (ACUTE ONLY): 15 min  Past Medical History:  Past Medical History:  Diagnosis Date   Angina pectoris ([place])    Anxiety    Arthritis    CHF (congestive heart failure) ([place])    Coronary artery disease    Depression    Diabetes mellitus without complication ([place])    Patient takes Metformin.   Diverticulosis    GERD (gastroesophageal reflux disease)    Hyperlipidemia    Hypertension    Multiple myeloma ([place])    Multiple myeloma ([place]) 03/27/2015   Myocardial infarction [facility]) April 2001   widowmaker   Shortness of breath dyspnea    Sleep apnea    No CPAP @ present   Spinal stenosis    Squamous cell carcinoma of skin 02/19/2014   Left dorsal hand. WD SCC with superficial infiltration. Re-shaved 04/23/2014. Re-shaved 09/05/2014.   Squamous cell carcinoma of skin 09/08/2020   Left ant scalp. WD SCC.   Squamous cell carcinoma of skin 02/04/2021   R hand dorsum, EDC    Past Surgical History:  Past Surgical History:  Procedure Laterality Date   CARDIAC CATHETERIZATION     CARPAL TUNNEL RELEASE     CATARACT EXTRACTION     COLONOSCOPY WITH PROPOFOL N/A 11/11/2015   Procedure: COLONOSCOPY WITH PROPOFOL;  Surgeon: Lollie Sails, MD;  Location: [facility] ENDOSCOPY;  Service: Endoscopy;  Laterality: N/A;   CORONARY ARTERY BYPASS GRAFT     EYE SURGERY Bilateral    Cataract Extraction   INGUINAL HERNIA REPAIR     JOINT REPLACEMENT Right 2008   Right Total Hip Replacement   PILONIDAL CYST EXCISION     ROTATOR CUFF REPAIR     TOTAL HIP ARTHROPLASTY Right    VENTRAL HERNIA REPAIR N/A 08/15/2015   Procedure: VENTRAL HERNIA REPAIR WITH MESH ;  Surgeon: Leonie Green, MD;  Location: [facility] ORS;  Service: General;  Laterality: N/A;   HPI:  Per H&P  "Luis Brood. is a 76 y.o. male with medical history significant for coronary artery disease status post CABG, history of multiple myeloma, diabetes mellitus with stage III chronic kidney disease, hypertension, anxiety and depression, recent hospitalization for MSSA sepsis from skin and soft tissue infection (completed IV antibiotic therapy with cefazolin on 12/31/21) who presents to the ER via EMS for evaluation of confusion per nursing home staff.  His family is at the bedside and states that he has been very weak.  During my evaluation patient is awake, alert and oriented to person and place but not to time.  He states that he has a cough that is occasionally productive of yellow phlegm but he denies having any fever, no chills, no abdominal pain, no nausea, no vomiting, no dizziness, no lightheadedness, no headache, no blurred vision or focal deficit.  Patient was noted to have elevated ammonia levels and received a dose of lactulose.  He will be admitted to the hospital for further evaluation."    Assessment / Plan / Recommendation  Clinical Impression  Pt seen for clinical swallowing evaluation. Pt alert, pleasant, and cooperative. On 2L/min O2 via [place]. Pt consuming breakfast upon SLP entrance to room. Pt with known hx of esophageal dysphagia stating, "I have a crooked esophagus, but it doesn't bother me much."  Per  chart review, esophagram 12/10/21, Impression "1. Markedly patulous esophagus above the GE junction with extensive tertiary contractions throughout the exam. "Corkscrewing" of the esophagus as well was noted with limited peristaltic activity distally though there was emptying of the esophagus despite segmental narrowing of the distal esophagus into the stomach. Findings could relate to distal esophageal stricture but could not be well assessed due to the debilitated nature of the patient. Marked dysmotility and the long segment narrowing with beaked appearance raising the question achalasia.  Esophageal manometry and endoscopy may be helpful to narrow the differential. 2. Given difficulty with swallowing a dedicated swallow function may also be helpful."  Per chart review, temp WNL and WBC WNL (on 3/5). CXR 01/08/22 "Chronic interstitial changes without definite acute abnormality."  Pt observed with consistencies from mech soft meal tray including chopped sausage, cut pancakes with gravy, and water and juice via cup sips. Pt reports not using straws. Oral phase was functional across trials. Pharyngeal swallow appeared [facility] per clinical assessment. To palpation, pt with seemingly timely swallow initiation and seemingly adequate laryngeal elevation. No overt or subtle s/sx pharyngeal dysphagia. No change to vocal quality across trials. No c/o of globus sensation with items from meals tray. Pt subjectively reports that he "does better" with soft/moist solids. Pt able to feed self, but benefited from set up assistance for opening of containers.  Recommend continuation of mech soft diet (chopped meals; well-moistened) with thin liquids with safe swallowing strategies/aspiration precaution and reflux precautions as outlined below including OOB for meals, upright during/after POs, and small/frequent meals. Pt may benefit from RD consult.  Pt is at increased risk for aspiration/aspiration PNA given hx of esophageal dysphagia, multiple medical comorbidities and waxing/waning mental status (per chart review).   SLP to f/u per POC for diet tolerance.  Pt, RN, and NT made aware of results, recommendations, safe swallowing strategies/aspiration precautions, and reflux precautions.  SLP Visit Diagnosis: Dysphagia, pharyngoesophageal phase (R13.14)    Aspiration Risk  Mild aspiration risk;Risk for inadequate nutrition/hydration    Diet Recommendation Dysphagia 3 (Mech soft);Thin liquid (chopped meats and breads; extra gravies, sauces, condiments)   Medication Administration: Whole meds with puree (if  needed for ease of swallowing) Supervision: Patient able to self feed Compensations: Minimize environmental distractions;Slow rate;Small sips/bites;Follow solids with liquid Postural Changes: Seated upright at 90 degrees;Remain upright for at least 30 minutes after po intake (OOB FOR MEALS!)    Other  Recommendations Recommended Consults: Consider GI evaluation;Consider esophageal assessment (RD consult) Oral Care Recommendations: Oral care BID;Oral care before and after PO;Staff/trained caregiver to provide oral care    Recommendations for follow up therapy are one component of a multi-disciplinary discharge planning process, led by the attending physician.  Recommendations may be updated based on patient status, additional functional criteria and insurance authorization.  Follow up Recommendations  (TBD)      Assistance Recommended at Discharge Intermittent Supervision/Assistance  Functional Status Assessment Patient has had a recent decline in their functional status and/or demonstrates limited ability to make significant improvements in function in a reasonable and predictable amount of time  Frequency and Duration min 1 x/week          Prognosis Prognosis for Safe Diet Advancement: Fair Barriers to Reach Goals: Time post onset;Severity of deficits;Behavior      Swallow Study   General Date of Onset: 01/11/22 HPI: Per H&P "Luis [name]. is a 76 y.o. male with medical history significant for coronary artery disease status post CABG, history of  multiple myeloma, diabetes mellitus with stage III chronic kidney disease, hypertension, anxiety and depression, recent hospitalization for MSSA sepsis from skin and soft tissue infection (completed IV antibiotic therapy with cefazolin on 12/31/21) who presents to the ER via EMS for evaluation of confusion per nursing home staff.  His family is at the bedside and states that he has been very weak.  During my evaluation patient is awake, alert  and oriented to person and place but not to time.  He states that he has a cough that is occasionally productive of yellow phlegm but he denies having any fever, no chills, no abdominal pain, no nausea, no vomiting, no dizziness, no lightheadedness, no headache, no blurred vision or focal deficit.  Patient was noted to have elevated ammonia levels and received a dose of lactulose.  He will be admitted to the hospital for further evaluation." Type of Study: Bedside Swallow Evaluation Previous Swallow Assessment: known to SLP services from previous admissions Diet Prior to this Study: Dysphagia 3 (soft);Thin liquids Temperature Spikes Noted: No Respiratory Status: Nasal cannula History of Recent Intubation: No Behavior/Cognition: Alert;Cooperative;Pleasant mood Oral Cavity Assessment: Within Functional Limits Oral Care Completed by SLP: Yes Oral Cavity - Dentition: Adequate natural dentition Vision: Functional for self-feeding Self-Feeding Abilities: Able to feed self;Needs assist;Needs set up Patient Positioning: Upright in bed Baseline Vocal Quality: Normal Volitional Cough: Strong Volitional Swallow: Able to elicit    Oral/Motor/Sensory Function Overall Oral Motor/Sensory Function: Within functional limits      Thin Liquid Thin Liquid: Within functional limits Presentation: Cup    Solid     Solid: Within functional limits Presentation: Self Fed     Cherrie Gauze, M.S., [facility] [phone] Luis [name])   Quintella Baton 01/11/2022,12:19 PM

## 2022-01-11 NOTE — Progress Notes (Signed)
Occupational Therapy Treatment ?Patient Details ?Name: Luis [name]. ?MRN: [id number] ?DOB: 08/02/1946 ?Today's Date: 01/11/2022 ? ? 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  S/p L knee aspiration 01/01/22.  S/p arthroscopic irrigation debridement of L knee secondary septic L knee joint 01/02/22. PT has been re-consulted as pt is now s/p Left common femoral, superficial femoral & profunda femoris endarterectomy & [place] embolectomy of the left SFA and popliteal arteries on 01/08/22. ?  ?OT comments ? Luis [name] presents with generalized weakness, deconditioning, limited endurance, and impaired balance. He endorses mild lower abdominal pain. Pt appears to be weaker and less steady than he was last week. Today he requires Mod A for all fxl mobility tasks except self-feeding, O2 sats drop into mid-80s with movement, pt tires quickly, requires frequent rests breaks, and reports feeling "constantly" short of breath. Pt left in bed, moved into chair position, in order to eat lunch, with bed alarm activated and call bell within reach. Pt's two sons and a hospice representative in room at end of session.  ? ?Recommendations for follow up therapy are one component of a multi-disciplinary discharge planning process, led by the attending physician.  Recommendations may be updated based on patient status, additional functional criteria and insurance authorization. ?   ?Follow Up Recommendations ? Skilled nursing-short term rehab (<3 hours/day)  ?  ?Assistance Recommended at Discharge Frequent or constant Supervision/Assistance  ?Patient can return home with the following ? A lot of help with walking and/or transfers;A lot of help with bathing/dressing/bathroom ?  ?Equipment Recommendations ? None recommended by OT  ?   ?Recommendations for Other Services   ? ?  ?Precautions / Restrictions Precautions ?Precautions: Fall ?Restrictions ?Weight Bearing Restrictions: No  ? ? ?  ? ?Mobility Bed Mobility ?Overal bed mobility: Needs Assistance ?Bed Mobility: Supine to Sit, Sit to Supine ?  ?  ?Supine to sit: Mod assist, HOB elevated ?Sit to supine: Mod assist ?  ?General bed mobility comments: Mod A for trunk control in supine<sit and for elevating LE in sit<supine ?  ? ?Transfers ?Overall transfer level: Needs assistance ?Equipment used: Rolling walker (2 wheels) ?Transfers: Sit to/from Stand ?Sit to Stand: Min assist ?  ?  ?  ?  ?  ?General transfer comment: cueing for hand positioning on RW ?  ?  ?Balance Overall balance assessment: Needs assistance ?Sitting-balance support: Feet supported ?Sitting balance-Leahy Scale: Fair ?Sitting balance - Comments: pt engages in front-to-back rocking while seated EOB, stating he finds it "calming" ?  ?Standing balance support: During functional activity, Bilateral upper extremity supported ?Standing balance-Leahy Scale: Fair ?Standing balance comment: BUE support on RW ?  ?  ?  ?  ?  ?  ?  ?  ?  ?  ?  ?   ? ?ADL either performed or assessed with clinical judgement  ? ?ADL Overall ADL's : Needs assistance/impaired ?Eating/Feeding: Supervision/ safety ?  ?Grooming: Maximal assistance;Standing;Sitting ?Grooming Details (indicate cue type and reason): attempts in standing but quickly tires and shifts to sitting EOB ?  ?  ?  ?  ?Upper Body Dressing : Moderate assistance ?Upper Body Dressing Details (indicate cue type and reason): to don/doff hospital gown ?  ?Lower Body Dressing Details (indicate cue type and reason): Max A to  don socks, which he states is his baseline -- he uses a sock aid at home ?  ?  ?  ?  ?  ?  ?  ?  ?  ? ?Extremity/Trunk Assessment Upper Extremity Assessment ?Upper Extremity Assessment: Generalized weakness ?  ?Lower Extremity Assessment ?Lower Extremity Assessment: Generalized  weakness ?  ?Cervical / Trunk Assessment ?Cervical / Trunk Assessment: Kyphotic ?  ? ?Vision Baseline Vision/History: 1 Wears glasses ?Patient Visual Report: No change from baseline ?  ?  ?Perception   ?  ?Praxis   ?  ? ?Cognition Arousal/Alertness: Awake/alert ?Behavior During Therapy: [facility] for tasks assessed/performed ?Overall Cognitive Status: Within Functional Limits for tasks assessed ?  ?  ?  ?  ?  ?  ?  ?  ?  ?  ?  ?  ?  ?  ?  ?  ?General Comments: pleasant and cooperative ?  ?  ?   ?Exercises Other Exercises ?Other Exercises: standing and sitting balance tolerance ? ?  ?Shoulder Instructions   ? ? ?  ?General Comments O2 sats drop into mid-80s with bed mobility, standing  ? ? ?Pertinent Vitals/ Pain       Pain Assessment ?Pain Assessment: 0-10 ?Pain Score: 2  ?Faces Pain Scale: Hurts a little bit ?Pain Location: lower abdomen ?Pain Descriptors / Indicators: Tender ?Pain Intervention(s): Monitored during session, Repositioned ? ?Home Living   ?  ?  ?  ?  ?  ?  ?  ?  ?  ?  ?  ?  ?  ?  ?  ?  ?  ?  ? ?  ?Prior Functioning/Environment    ?  ?  ?  ?   ? ?Frequency ? Min 2X/week  ? ? ? ? ?  ?Progress Toward Goals ? ?OT Goals(current goals can now be found in the care plan section) ? Progress towards OT goals: Progressing toward goals ? ?Acute Rehab OT Goals ?Patient Stated Goal: to be comfortable ?OT Goal Formulation: With patient/family ?Time For Goal Achievement: 01/23/22 ?Potential to Achieve Goals: Good  ?Plan Discharge plan remains appropriate;Frequency remains appropriate   ? ?Co-evaluation ? ? ?   ?  ?  ?  ?  ? ?  ?AM-PAC OT "6 Clicks" Daily Activity     ?Outcome Measure ? ? Help from another person eating meals?: None ?Help from another person taking care of personal grooming?: A Little ?Help from another person toileting, which includes using toliet, bedpan, or urinal?: A Lot ?Help from another person bathing (including washing, rinsing, drying)?: A Lot ?Help from another person to put on and taking off  regular upper body clothing?: A Little ?Help from another person to put on and taking off regular lower body clothing?: A Lot ?6 Click Score: 16 ? ?  ?End of Session Equipment Utilized During Treatment: Rolling walker (2 wheels);Oxygen ? ?OT Visit Diagnosis: Unsteadiness on feet (R26.81);Muscle weakness (generalized) (M62.81);History of falling (Z91.81) ?  ?Activity Tolerance Patient tolerated treatment well ?  ?Patient Left in bed;with call bell/phone within reach;with bed alarm set;with nursing/sitter in room;with family/visitor present;Other (comment) ([facility] hospice representative in room to talk with pt, family) ?  ?Nurse Communication   ?  ? ?   ? ?Time: 4709-6283 ?OT Time Calculation (min): 34 min ? ?Charges: OT General Charges ?$OT Visit: 1 Visit ?OT Treatments ?$Self Care/Home Management : 23-37 mins ? ?Josiah Lobo, PhD, MS, OTR/L ?01/11/22, 2:33 PM ? ?

## 2022-01-11 NOTE — Progress Notes (Signed)
[facility]) Hospital Liaison Note ?  ?Received request from Transitions of Care Manager, Shelton Silvas, for hospice services at a facility after discharge. Chart and patient information under review by [facility] physician. Hospice eligibility pending. ?  ?Spoke with patient and sons at bedside to initiate education related to hospice philosophy, services, and team approach to care. All verbalized understanding of information given. Per discussion, the plan is for patient to discharge home via [facility] once cleared to DC.  ? ?DME needs to be discussed pending dispo confirmation. ? ?Please send signed and completed DNR home with patient/family. Please provide prescriptions at discharge as needed to ensure ongoing symptom management.  ?  ?[facility] information and contact numbers given to family & above information shared with TOC. ?  ?Please call with any questions/concerns.  ?  ?Thank you for the opportunity to participate in this patient's care. ?  ?Daphene Calamity, MSW ?[place]  ?[phone] ? ?

## 2022-01-11 NOTE — TOC Progression Note (Signed)
Transition of Care (TOC) - Progression Note  ? ? ?Patient Details  ?Name: Luis [name]. ?MRN: [id number] ?Date of Birth: 10/20/1946 ? ?Transition of Care (TOC) CM/SW Contact  ?Marck Mcclenny A Nikkita Adeyemi, LCSW ?Phone Number: ?01/11/2022, 2:30 PM ? ?Clinical Narrative:  CSW spoke with pt's son Aaron Edelman and he wanted hospice to eval for residential hospice. However, at this time pt does not meet the qualifications for residential hospice. CSW reconnected with Aaron Edelman and explained that per Magda Paganini with [employer], pt only has three days left with insurance. Pt is from [facility]. CSW asked pt's son would they prefer to send pt back to [facility] with hospice following. Pt's son states that is the only option. CSW to reach out to [place] [facility].   ? ? ? ?  ?  ? ?Expected Discharge Plan and Services ?  ?  ?  ?  ?  ?                ?  ?  ?  ?  ?  ?  ?  ?  ?  ?  ? ? ?Social Determinants of Health (SDOH) Interventions ?  ? ?Readmission Risk Interventions ?No flowsheet data found. ? ?

## 2022-01-12 ENCOUNTER — Inpatient Hospital Stay: Payer: Medicare Other

## 2022-01-12 DIAGNOSIS — K7682 Hepatic encephalopathy: Secondary | ICD-10-CM | POA: Diagnosis not present

## 2022-01-12 LAB — BODY FLUID CELL COUNT WITH DIFFERENTIAL
Eos, Fluid: 0 %
Lymphs, Fluid: 19 %
Monocyte-Macrophage-Serous Fluid: 64 % (ref 50–90)
Neutrophil Count, Fluid: 17 % (ref 0–25)
Total Nucleated Cell Count, Fluid: 109 cu mm (ref 0–1000)

## 2022-01-12 LAB — GLUCOSE, CAPILLARY
Glucose-Capillary: 106 mg/dL — ABNORMAL HIGH (ref 70–99)
Glucose-Capillary: 111 mg/dL — ABNORMAL HIGH (ref 70–99)

## 2022-01-12 LAB — GLUCOSE, PLEURAL OR PERITONEAL FLUID: Glucose, Fluid: 128 mg/dL

## 2022-01-12 LAB — RENAL FUNCTION PANEL
Albumin: 2 g/dL — ABNORMAL LOW (ref 3.5–5.0)
Anion gap: 10 (ref 5–15)
BUN: 42 mg/dL — ABNORMAL HIGH (ref 8–23)
CO2: 24 mmol/L (ref 22–32)
Calcium: 7.9 mg/dL — ABNORMAL LOW (ref 8.9–10.3)
Chloride: 102 mmol/L (ref 98–111)
Creatinine, Ser: 2.26 mg/dL — ABNORMAL HIGH (ref 0.61–1.24)
GFR, Estimated: 29 mL/min — ABNORMAL LOW (ref >60)
Glucose, Bld: 113 mg/dL — ABNORMAL HIGH (ref 70–99)
Phosphorus: 4.8 mg/dL — ABNORMAL HIGH (ref 2.5–4.6)
Potassium: 3.3 mmol/L — ABNORMAL LOW (ref 3.5–5.1)
Sodium: 136 mmol/L (ref 135–145)

## 2022-01-12 LAB — LACTIC ACID, PLASMA: Lactic Acid, Venous: 2.2 mmol/L (ref 0.5–1.9)

## 2022-01-12 LAB — ALBUMIN, PLEURAL OR PERITONEAL FLUID: Albumin, Fluid: <1.5 g/dL

## 2022-01-12 LAB — LACTATE DEHYDROGENASE, PLEURAL OR PERITONEAL FLUID: LD, Fluid: 41 U/L — ABNORMAL HIGH (ref 3–23)

## 2022-01-12 LAB — MAGNESIUM: Magnesium: 1.6 mg/dL — ABNORMAL LOW (ref 1.7–2.4)

## 2022-01-12 MED ORDER — MAGNESIUM SULFATE 2 GM/50ML IV SOLN
2.0000 g | Freq: Once | INTRAVENOUS | Status: AC
  Administered 2022-01-12: 2 g via INTRAVENOUS
  Filled 2022-01-12: qty 50

## 2022-01-12 MED ORDER — ALBUMIN HUMAN 25 % IV SOLN
12.5000 g | Freq: Once | INTRAVENOUS | Status: AC
  Administered 2022-01-12: 12.5 g via INTRAVENOUS
  Filled 2022-01-12: qty 50

## 2022-01-12 MED ORDER — POTASSIUM CHLORIDE CRYS ER 20 MEQ PO TBCR
40.0000 meq | EXTENDED_RELEASE_TABLET | Freq: Once | ORAL | Status: AC
  Administered 2022-01-12: 40 meq via ORAL
  Filled 2022-01-12: qty 2

## 2022-01-12 MED ORDER — PROSOURCE PLUS PO LIQD
30.0000 mL | Freq: Three times a day (TID) | ORAL | Status: DC
  Administered 2022-01-12 – 2022-01-13 (×5): 30 mL via ORAL
  Filled 2022-01-12 (×8): qty 30

## 2022-01-12 MED ORDER — LACTATED RINGERS IV SOLN: INTRAVENOUS | Status: AC

## 2022-01-12 NOTE — Progress Notes (Signed)
[facility]) Hospital Liaison Note ?  ?Dispo planning/conversations continue. [facility] to continue to follow while discharge plan is confirmed. ? ?Please send signed and completed DNR home with patient/family. Please provide prescriptions at discharge as needed to ensure ongoing symptom management.  ?  ?[facility] information and contact numbers given to family & above information shared with TOC. ?  ?Please call with any questions/concerns.  ?  ?Thank you for the opportunity to participate in this patient's care. ?  ?Daphene Calamity, MSW ?[place]  ?[phone] ?

## 2022-01-12 NOTE — Assessment & Plan Note (Signed)
Likely from decreased clearance with her liver cirrhosis. ?Lactic acid remained at 2.2. ?-Giving some IV fluid ?-Monitor lactic acid ?

## 2022-01-12 NOTE — Progress Notes (Signed)
Nutrition Follow-up ? ?DOCUMENTATION CODES:  ? ?Non-severe (moderate) malnutrition in context of chronic illness ? ?INTERVENTION:  ? ?-Boost Breeze po TID, each supplement provides 250 kcal and 9 grams of protein  ?-30 ml Proosurce Plus TID, each supplement provides 100 kcals and 15 grams protein ?-MVI with minerals daily ? ?NUTRITION DIAGNOSIS:  ? ?Moderate Malnutrition related to chronic illness (CHF, multiple myeloma) as evidenced by mild fat depletion, mild muscle depletion, moderate muscle depletion. ? ?Ongoing ? ?GOAL:  ? ?Patient will meet greater than or equal to 90% of their needs ? ?Progressing  ? ?MONITOR:  ? ?PO intake, Supplement acceptance, Labs, Weight trends, Skin, I & O's ? ?REASON FOR ASSESSMENT:  ? ?Malnutrition Screening Tool ?  ? ?ASSESSMENT:  ? ?Pt admitted from SNF with AMS secondary to hepatic encephalopathy. PMH includes CAD s/p CABG, h/o multiple myeloma, T2DM with stage III CKD, HTN, anxiety and depression. Recently admitted for MSSA sepsis from skin and soft tissue infection. ? ?3/2- s/p paracentesis (4 L removed) ?3/6- s/p BSE- downgraded to dysphagia 3 diet ? ?Reviewed I/O's: -590 ml x 24 hours and -841 ml since admission ? ?UOP: 1.1 L x 24 hours  ? ?Spoke with pt at bedside, who reports feeling better after paracentesis. Pt reports that he has had a decreased appetite over the past 2 years secondary to his wife passing. Pt shares he never really had a great appetite at baseline, but this is worsened after his wife passed away. He shares that hsi favorite foods include salads and pudding. Pt does not like Ensure supplements, but does like the Boost Breeze.  ? ?Per pt, his UBW is around 225# and has lost 30# over the past 2 years.  ? ?Discussed importance of good meal and supplement intake to promote healing.  ? ?Per TOC notes, possible plan to return to SNF with hospice.  ? ?Medications reviewed and include lactulose, calcium with vitamin D, and lactated ringers infusion @ 100 ml/hr.   ? ?Labs reviewed: K: 3.3, Phos: 4.8, Mg: 1.6, CBGS: 106-111 (inpatient orders for glycemic control are none).   ? ?NUTRITION - FOCUSED PHYSICAL EXAM: ? ?Flowsheet Row Most Recent Value  ?Orbital Region Mild depletion  ?Upper Arm Region Moderate depletion  ?Thoracic and Lumbar Region Mild depletion  ?Buccal Region Moderate depletion  ?Temple Region Moderate depletion  ?Clavicle Bone Region Severe depletion  ?Clavicle and Acromion Bone Region Severe depletion  ?Scapular Bone Region Severe depletion  ?Dorsal Hand Severe depletion  ?Patellar Region Moderate depletion  ?Anterior Thigh Region Moderate depletion  ?Posterior Calf Region Moderate depletion  ?Edema (RD Assessment) None  ?Hair Reviewed  ?Eyes Reviewed  ?Mouth Reviewed  ?Skin Reviewed  ?Nails Reviewed  ? ?  ? ? ?Diet Order:   ?Diet Order   ? ?       ?  DIET DYS 3 Room service appropriate? Yes; Fluid consistency: Thin  Diet effective now       ?  ? ?  ?  ? ?  ? ? ?EDUCATION NEEDS:  ? ?Education needs have been addressed ? ?Skin:  Skin Assessment: Skin Integrity Issues: ?Stage I: L/R buttock ? ?Last BM:  01/12/22 ? ?Height:  ? ?Ht Readings from Last 1 Encounters:  ?01/08/22 '5\' 6"'  (1.676 m)  ? ? ?Weight:  ? ?Wt Readings from Last 1 Encounters:  ?01/12/22 81.6 kg  ? ? ?Ideal Body Weight:  61.8 kg ? ?BMI:  Body mass index is 29.04 kg/m?. ? ?Estimated Nutritional Needs:  ? ?  Kcal:  1950-2150 ? ?Protein:  110-125 grams ? ?Fluid:  > 1.9 L ? ? ? ?Loistine Chance, RD, LDN, CDCES ?Registered Dietitian II ?Certified Diabetes Care and Education Specialist ?Please refer to [facility] for RD and/or RD on-call/weekend/after hours pager  ?

## 2022-01-12 NOTE — Progress Notes (Signed)
Chaplain provided Luis [name] listening presence as he shared a life review. He has lived a structured light, seeking to do the right things. 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 He can celebrate his family including his deceased wife, Luis [name], two sons who are involved in his care, and extended family. He desires to spend time with his siblings this spring and summer enjoying family memories. Peace is upon him while he seeks to live a little longer.  ?He has many different religious experiences. [employer] has been most helpful.  ? ? ? 01/12/22 2000  ?Clinical Encounter Type  ?Visited With Patient and family together  ?Visit Type Follow-up  ?Referral From Chaplain  ?Spiritual Encounters  ?Spiritual Needs Grief support  ? ? ? ? ?

## 2022-01-12 NOTE — NC FL2 (Signed)
?[place] MEDICAID FL2 LEVEL OF CARE SCREENING TOOL  ?  ? ?IDENTIFICATION  ?Patient Name: ?Luis [name]. Birthdate: 1946/02/27 Sex: male Admission Date (Current Location): ?01/08/2022  ?[place] and [place] Number: ? [place] ?  Facility and Address:  ?[facility], [address] [place], [place] [address] ?     Provider Number: ?[id number]  ?Attending Physician Name and Address:  ?Lorella Nimrod, MD ? Relative Name and Phone Number:  ?  ?   ?Current Level of Care: ?Hospital Recommended Level of Care: ?[place] Prior Approval Number: ?  ? ?Date Approved/Denied: ?  PASRR Number: ?[id number] ? ?Discharge Plan: ?SNF ?  ? ?Current Diagnoses: ?Patient Active Problem List  ? Diagnosis Date Noted  ? Pressure injury of skin 01/09/2022  ? Acute metabolic encephalopathy 73/22/0254  ? Stage 3b chronic kidney disease (CKD) ([employer]) 01/09/2022  ? Hepatic encephalopathy 01/08/2022  ? Lactic acidosis 01/08/2022  ? AKI (acute kidney injury) ([place])   ? Pleural effusion   ? Aspiration into airway   ? Cirrhosis ([place])   ? Dysphagia   ? Ascites   ? Malnutrition of moderate degree 12/10/2021  ? Sepsis ([place]) 12/09/2021  ? Cellulitis 12/09/2021  ? Hypokalemia 12/09/2021  ? Pancytopenia ([place]) 12/16/2020  ? HCAP (healthcare-associated pneumonia) 11/07/2020  ? Severe sepsis ([place]) 11/07/2020  ? HLD (hyperlipidemia) 11/07/2020  ? GERD (gastroesophageal reflux disease) 11/07/2020  ? Depression with anxiety 11/07/2020  ? Chronic diastolic CHF (congestive heart failure) ([place]) 11/07/2020  ? Elevated troponin 11/07/2020  ? Atrial fibrillation with RVR ([place]) 11/07/2020  ? T7 vertebral fracture ([place]) 11/07/2020  ? Aspiration pneumonia ([place]) 11/07/2020  ? Squamous cell skin cancer 09/18/2020  ? Scalp lesion 08/24/2020  ? Maintenance chemotherapy 06/14/2020  ? COVID-19 virus infection (vaccinated Pfizer 01/2020) 06/14/2020  ? COVID-19 06/14/2020  ? Weight loss 11/22/2019  ? Compression fracture of body of thoracic  vertebra ([place]) 09/19/2019  ? Macrocytic anemia 03/25/2019  ? Renal insufficiency 03/25/2019  ? Abnormal iron saturation 02/18/2019  ? Bilateral carotid artery stenosis 03/21/2018  ? Connective tissue and disc stenosis of intervertebral foramina of lumbar region 02/06/2018  ? Hip strain, initial encounter 02/06/2018  ? History of total right hip replacement 02/06/2018  ? Obesity (BMI 30-39.9) 02/06/2018  ? Goals of care, counseling/discussion 11/18/2017  ? Drug-induced neutropenia ([place]) 01/23/2017  ? Hypocalcemia 06/28/2016  ? Type 2 diabetes mellitus without complication, without long-term current use of insulin ([place]) 02/17/2016  ? Arthritis 05/01/2015  ? TI (tricuspid incompetence) 04/16/2015  ? B12 deficiency 03/31/2015  ? Diarrhea 03/31/2015  ? Multiple myeloma ([place]) 03/27/2015  ? 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 MI (mitral incompetence) 09/17/2014  ? Obstructive apnea 09/17/2014  ? Neuritis or radiculitis due to rupture of lumbar intervertebral disc 08/27/2014  ? Spinal stenosis 08/27/2014  ? Degenerative arthritis of lumbar spine 08/27/2014  ? Degenerative arthritis of hip 08/27/2014  ? Arthritis, degenerative 03/28/2014  ? Chest pain 03/14/2014  ? Generalized weakness 03/14/2014  ? Breath shortness 03/14/2014  ? 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 01/11/2014  ? Combined fat and carbohydrate induced hyperlipemia 01/11/2014  ? Compulsive tobacco user syndrome 01/11/2014  ? CAD (coronary artery disease) of artery bypass graft 01/11/2014  ? ? ?Orientation RESPIRATION BLADDER Height & Weight   ?  ?Self, Time, Situation, Place ? O2 ([place] 2L) Incontinent Weight: 179 lb 14.3 oz (81.6 kg) ?Height:  '5\' 6"'  (167.6 cm)  ?BEHAVIORAL SYMPTOMS/MOOD NEUROLOGICAL BOWEL NUTRITION STATUS  ?  Continent Diet (DYS 3 diet, thin liquids)  ?AMBULATORY STATUS COMMUNICATION OF NEEDS Skin   ?Extensive Assist Verbally PU Stage and Appropriate Care ?PU Stage 1 Dressing:  (left and right buttocks,  foam dressing) ?  ?  ?    ?     ?     ? ? ?Personal Care Assistance Level of Assistance  ?Dressing, Feeding, Bathing Bathing Assistance: Maximum assistance ?Feeding assistance: Independent ?Dressing Assistance: Maximum assistance ?Total Care Assistance: Maximum assistance  ? ?Functional Limitations Info  ?Sight, Hearing, Speech Sight Info: Adequate ?Hearing Info: Adequate ?Speech Info: Adequate  ? ? ?SPECIAL CARE FACTORS FREQUENCY  ?PT (By licensed PT), OT (By licensed OT)   ?  ?PT Frequency: 5x ?OT Frequency: 5x ?  ?  ?  ?   ? ? ?Contractures Contractures Info: Not present  ? ? ?Additional Factors Info  ?Code Status, Allergies Code Status Info: DNR ?Allergies Info: : Pravastatin Sodium, Statins, Zocor (Simvastatin) ?  ?  ?  ?   ? ?Current Medications (01/12/2022):  This is the current hospital active medication list ?Current Facility-Administered Medications  ?Medication Dose Route Frequency Provider Last Rate Last Admin  ? (feeding supplement) PROSource Plus liquid 30 mL  30 mL Oral TID BM Amin, Soundra Pilon, MD      ? 0.9 %  sodium chloride infusion  250 mL Intravenous PRN Agbata, Tochukwu, MD      ? acetaminophen (TYLENOL) tablet 650 mg  650 mg Oral Q6H PRN Agbata, Tochukwu, MD   650 mg at 01/10/22 2141  ? albuterol (PROVENTIL) (2.5 MG/3ML) 0.083% nebulizer solution 3 mL  3 mL Inhalation Q6H PRN Agbata, Tochukwu, MD   3 mL at 01/12/22 0414  ? ALPRAZolam Duanne Moron) tablet 0.25 mg  0.25 mg Oral QHS PRN Agbata, Tochukwu, MD   0.25 mg at 01/10/22 2019  ? aspirin EC tablet 81 mg  81 mg Oral Daily Agbata, Tochukwu, MD   81 mg at 01/12/22 0852  ? calcium-vitamin D (OSCAL WITH D) 500-5 MG-MCG per tablet 1 tablet  1 tablet Oral BID Collier Bullock, MD   1 tablet at 01/12/22 0852  ? enoxaparin (LOVENOX) injection 40 mg  40 mg Subcutaneous Q24H Rauer, Forde Dandy, RPH   40 mg at 01/11/22 2147  ? feeding supplement (BOOST / RESOURCE BREEZE) liquid 1 Container  1 Container Oral TID BM Kayleen Memos, DO   1 Container at 01/12/22 [phone]   ? lactated ringers infusion   Intravenous Continuous Lorella Nimrod, MD      ? lactulose (CHRONULAC) 10 GM/15ML solution 30 g  30 g Oral TID Agbata, Tochukwu, MD   30 g at 01/12/22 0853  ? MEDLINE mouth rinse  15 mL Mouth Rinse BID Sharion Settler, NP   15 mL at 01/12/22 0854  ? metoprolol tartrate (LOPRESSOR) tablet 12.5 mg  12.5 mg Oral BID Kc, Ramesh, MD   12.5 mg at 01/10/22 2015  ? midodrine (PROAMATINE) tablet 10 mg  10 mg Oral TID WC Athena Masse, MD   10 mg at 01/12/22 0488  ? multivitamin with minerals tablet 1 tablet  1 tablet Oral Daily Agbata, Tochukwu, MD   1 tablet at 01/12/22 0852  ? ondansetron (ZOFRAN) tablet 4 mg  4 mg Oral Q6H PRN Agbata, Tochukwu, MD      ? Or  ? ondansetron (ZOFRAN) injection 4 mg  4 mg Intravenous Q6H PRN Agbata, Tochukwu, MD      ? pantoprazole (PROTONIX) EC tablet 40 mg  40  mg Oral Daily Agbata, Tochukwu, MD   40 mg at 01/12/22 7530  ? PARoxetine (PAXIL) tablet 10 mg  10 mg Oral Daily Agbata, Tochukwu, MD   10 mg at 01/12/22 0858  ? pravastatin (PRAVACHOL) tablet 40 mg  40 mg Oral q1800 Agbata, Tochukwu, MD   40 mg at 01/11/22 1801  ? QUEtiapine (SEROQUEL) tablet 25 mg  25 mg Oral QHS Antonieta Pert, MD   25 mg at 01/11/22 2147  ? sodium chloride flush (NS) 0.9 % injection 3 mL  3 mL Intravenous Q12H Agbata, Tochukwu, MD   3 mL at 01/12/22 0854  ? sodium chloride flush (NS) 0.9 % injection 3 mL  3 mL Intravenous PRN Agbata, Tochukwu, MD      ? tamsulosin (FLOMAX) capsule 0.4 mg  0.4 mg Oral Daily Kc, Ramesh, MD   0.4 mg at 01/12/22 0511  ? ? ? ?Discharge Medications: ?Please see discharge summary for a list of discharge medications. ? ?Relevant Imaging Results: ? ?Relevant Lab Results: ? ? ?Additional Information ?SSN: [id number] ? ?Relda Agosto A Judah Carchi, LCSW ? ? ? ? ?

## 2022-01-12 NOTE — Progress Notes (Addendum)
Speech Language Pathology Treatment: Dysphagia  ?Patient Details ?Name: Luis [name]. ?MRN: [id number] ?DOB: 1946/10/04 ?Today's Date: 01/12/2022 ?Time: 4496-7591 ?SLP Time Calculation (min) (ACUTE ONLY): 40 min ? ?Assessment / Plan / Recommendation ?Clinical Impression ? Pt seen today for ongoing assessment of toleration of oral diet; education on general aspiration precautions and REFLUX precautions d/t pt's baseline h/o Esophageal Dysmotility. Pt afebrile; WBC WNL; on 2L [place] O2 support - unchanged. Pt is on a PPI. Noted plan for Hospice services at Discharge.  ? ?Pt has a Baseline of Esophageal phase Dysmotility per chart review: Esophagram 12/10/21, Impression "1. Markedly patulous esophagus above the GE junction with extensive tertiary contractions throughout the exam. "Corkscrewing" of the esophagus as well was noted with limited peristaltic activity distally though there was emptying of the esophagus despite segmental narrowing of the distal esophagus into the stomach. Findings could relate to distal esophageal stricture but could not be well assessed due to the debilitated nature of the patient. Marked dysmotility and the long segment narrowing with beaked appearance raising the question achalasia. Esophageal manometry and endoscopy may be helpful to narrow the differential.".  ? ?Pt is at increased risk for aspiration/aspiration PNA given hx of Significant degree of Esophageal phase dysphagia, multiple medical comorbidities and waxing/waning mental status (per chart review). ANY Esophageal phase dysmotility can impact the oropharyngeal phases of swallowing and increase risk for aspiration of REFLUX material thus impact Pulmonary status. ? ?Pt was observed at Lunch meal w/ mech soft meal tray including chopped meats, potatoes, fruit, and thin liquids via Cup. Discussed and reviewed general aspiration precautions; Reflux precautions. Oral phase was grossly functional w/ all consistencies. Pharyngeal phase  appeared grossly [facility] fairly timely swallow initiation, no immediate overt cough/throat clear/wet vocal quality, no decline in respiratory effort during/post trials. Pt was instructed to take TIME b/t trials to allow for clearing; mild c/o globus sensation trial of meat vs other foods; pt stated this was "usual" for him -- he ate all of the fruit. Pt able to feed self, but benefited from set up assistance for opening containers/packages. ?  ?Recommend continuation of mech soft diet (chopped meals; well-moistened) with thin liquids Via CUP; safe swallowing strategies/aspiration precaution; REFLUX precautions including OOB for meals, upright during/after POs, and small/frequent meals. Pt may benefit from RD consult. Handouts given on REFLUX information. ? ?Pt, RN, and MD made aware of results, recommendations, safe swallowing strategies/aspiration precautions, and reflux precautions. No further skilled ST services indicated at this time. If further GI issues/discomfort or complaints, then recommend f/u w/ GI for management. ? ? ? ? ?  ?HPI HPI: Per H&P "Egor Fullilove. is a 76 y.o. male with medical history significant for coronary artery disease status post CABG, history of multiple myeloma, diabetes mellitus with stage III chronic kidney disease, hypertension, anxiety and depression, recent hospitalization for MSSA sepsis from skin and soft tissue infection (completed IV antibiotic therapy with cefazolin on 12/31/21) who presents to the ER via EMS for evaluation of confusion per nursing home staff.  His family is at the bedside and states that he has been very weak.  During my evaluation patient is awake, alert and oriented to person and place but not to time.  He states that he has a cough that is occasionally productive of yellow phlegm but he denies having any fever, no chills, no abdominal pain, no nausea, no vomiting, no dizziness, no lightheadedness, no headache, no blurred vision or focal deficit.  Patient  was noted  to have elevated ammonia levels and received a dose of lactulose.  He will be admitted to the hospital for further evaluation." ?  ?   ?SLP Plan ? All goals met ? ?  ?  ?Recommendations for follow up therapy are one component of a multi-disciplinary discharge planning process, led by the attending physician.  Recommendations may be updated based on patient status, additional functional criteria and insurance authorization. ?  ? ?Recommendations  ?Diet recommendations: Dysphagia 3 (mechanical soft);Thin liquid (for ease, conservation of energy, and Esophageal clearing) ?Liquids provided via: Cup;No straw ?Medication Administration: Whole meds with puree (for safer swallowing) ?Supervision: Patient able to self feed;Intermittent supervision to cue for compensatory strategies (tray setup) ?Compensations: Minimize environmental distractions;Slow rate;Small sips/bites;Lingual sweep for clearance of pocketing;Multiple dry swallows after each bite/sip;Follow solids with liquid ?Postural Changes and/or Swallow Maneuvers: Out of bed for meals;Seated upright 90 degrees;Upright 30-60 min after meal (REFLUX precautions)  ?   ?    ?   ? ? ? ? General recommendations:  (Dietician f/u; Palliative Care f/u for GOC/support) ?Oral Care Recommendations: Oral care BID;Oral care before and after PO;Staff/trained caregiver to provide oral care (support/setup needed) ?Follow Up Recommendations: No SLP follow up (currently) ?Assistance recommended at discharge: Set up Supervision/Assistance ?SLP Visit Diagnosis: Dysphagia, pharyngoesophageal phase (R13.14) (baseline Esophageal Dysmotility) ?Plan: All goals met ? ? ? ? ?  ?  ? ? ? ? , MS, CCC-SLP ?Speech Language Pathologist ?Rehab Services; [facility] ?[phone] (ascom) ? ?, ? ?01/12/2022, 5:23 PM ?

## 2022-01-12 NOTE — Progress Notes (Signed)
?Progress Note ? ? ?Patient: Luis [name]. EZM:[id number] DOB: 1945/11/21 DOA: 01/08/2022     4 ?DOS: the patient was seen and examined on 01/12/2022 ?  ?Brief hospital course: ? 76 y.o. male with  hx of CAD S/P CABG, history of multiple myeloma, diabetes mellitus with stage III chronic kidney disease iiB- CREAT ~ 1.7-1.9 ( FEB 2023), hypertension, anxiety and depression, recent hospitalization for MSSA sepsis from skin and soft tissue infection (completed IV antibiotic therapy with cefazolin on 12/31/21) presented for evaluation of altered mental status from nursing facility.  Patient was also reporting very weak. ?In the ED, patient is awake alert oriented to person place but not to time, reported having cough occasional productive with yellow phlegm.  Vital stable afebrile, labs with CKD creatinine slightly elevated from baseline 2.0, AST 58 lactic acid in low 2s, anemia hemoglobin 10.2 g UA no evidence of UTI CT/MRI head chest x-ray -no acute finding.  Ammonia level at 47 total bili 2.4, placed on lactulose and admitted for further management. ? ?3/6: Overnight patient became hypotensive and received 1 L of IV bolus.  Also given an extra dose of midodrine.  Received morning torsemide and spironolactone before nursing staff was told to hold them due to mild worsening of creatinine.  Blood pressure remained borderline, giving 1 L of LR. ?Midodrine dose was increased to 10 mg 3 times a day. ?PT/OT is recommending rehab. ?Apparently only 3 days left for rehab.  ? Does not meet the criteria for inpatient hospice services as requested by son.  Most likely will be going back to [facility] with hospice. ?Also ordered abdominal ultrasound to see the amount of ascites, will consider paracentesis if needed. ? ?3/7: Patient underwent another paracentesis with removal of 4 L of transudative fluid.  Feeling much relieved after the procedure.  He was quite somnolent but easily arousable, able to answer questions appropriately.   Denies any pain. ?Renal function with some worsening, lactic acid remained elevated at 2.2, concern of hepatorenal. ?Also giving some more fluid.  Diuretics were discontinued. ?Family is looking for a SNF with hospice. ? ? ? ?Assessment and Plan: ?* Hepatic encephalopathy ?Acute metabolic encephalopathy ?Generalized weakness/debility: ?Improved, now appear at his baseline. ?Patient presenting with confusion worse from baseline along with generalized weakness, work-up so far unremarkable for UTI pneumonia MRI brain and CT head no acute finding.  Likely multifactorial in the setting of hepatic encephalopathy. ?-PT/OT is recommending SNF-patient has only 3 days left. ?-Family is willing to use co-pay days-most likely will be going to SNF with hospice. ?-TOC is working on it. ? ?Acute metabolic encephalopathy ?See above ? ?Cirrhosis ([place]) ?Alcoholic liver cirrhosis with ascites with recent paracentesis: See above, mildly distended abdomen.  His home dose of torsemide and spironolactone was resumed but resulted in worsening hypotension. ?Renal function with mild worsening and lactic acid remained elevated. ?Patient underwent another paracentesis with removal of 4 L of transudative fluid. ?Patient did receive empiric ceftriaxone which was discontinued on 01/11/2022 as there was no sign of infection. ?-Keep holding torsemide and spironolactone. ?-Concern of hepatorenal. ?-Giving some IV fluid ?-Monitor lactic acid and renal function ? ? ?Lactic acidosis ?Likely from decreased clearance with her liver cirrhosis. ?Lactic acid remained at 2.2. ?-Giving some IV fluid ?-Monitor lactic acid ? ?Stage 3b chronic kidney disease (CKD) ([place]) ?Mild worsening of creatinine to 2.26.  Baseline around 1.9-1.8. ?Patient was restarted on diuretic and developed hypotension. ?Concern of hepatorenal. ?-Give him 1 more liter of  LR ?-Keep holding diuretics ?-Monitor renal function ? ?CAD (coronary artery disease) of artery bypass graft ?No chest  pain continue on aspirin and statin not on beta-blocker due to hypotension. On torsemide and Aldactone with liver cirrhosis-hopefully can resume. ? ?Chronic diastolic CHF (congestive heart failure) ([place]) ?Volume status is stable.  Monitor closely intake output Daily weight. ? ?Depression with anxiety ?Mood stable continue as multiple regimen with bedtime Seroquel, Paxil, Xanax ? ?Generalized weakness ?PT/OT are recommending SNF. ?-TOC is working on it, family is willing to go with copayment days. ? ?Multiple myeloma ([place]) ?History, labs stable.  Outpatient follow-up advised ? ?Type 2 diabetes mellitus without complication, without long-term current use of insulin ([place]) ?Blood sugar is stable ?-continue to hold metformin  ?-Continue sliding scale insulin with low-carb diet.   ? ? ?Obesity (BMI 30-39.9) ?BMI 30.9 will benefit with weight loss. ? ?Pressure injury of skin ?Continue wound care offloading for left buttock wound as below ?Pressure Injury 01/08/22 Buttocks Left Stage 1 -  Intact skin with non-blanchable redness of a localized area usually over a bony prominence. reddened (Active)  ?01/08/22 1840  ?Location: Buttocks  ?Location Orientation: Left  ?Staging: Stage 1 -  Intact skin with non-blanchable redness of a localized area usually over a bony prominence.  ?Wound Description (Comments): reddened  ?Present on Admission: Yes  ?   ?Pressure Injury 01/08/22 Buttocks Right Stage 1 -  Intact skin with non-blanchable redness of a localized area usually over a bony prominence. reddened (Active)  ?01/08/22 1840  ?Location: Buttocks  ?Location Orientation: Right  ?Staging: Stage 1 -  Intact skin with non-blanchable redness of a localized area usually over a bony prominence.  ?Wound Description (Comments): reddened  ?Present on Admission: Yes  ? ? ? ? ? ?  ? ?Subjective: Patient was seen after the paracentesis, stating that he is feeling much better after removal of fluid.  Remained little somnolent but easily  arousable and able to answer all questions appropriately. ? ?Physical Exam: ?Vitals:  ? 01/12/22 0822 01/12/22 0954 01/12/22 1026 01/12/22 1203  ?BP: 115/60 (!) 103/54 98/75 (!) 111/55  ?Pulse: 63 64 71 (!) 55  ?Resp: 16   18  ?Temp: (!) 97.5 ?F (36.4 ?C)   97.8 ?F (36.6 ?C)  ?TempSrc: Oral     ?SpO2: 96% 99% 100% 99%  ?Weight:      ?Height:      ? ?General.  Chronically ill-appearing elderly man, in no acute distress. ?Pulmonary.  Lungs clear bilaterally, normal respiratory effort. ?CV.  Regular rate and rhythm, no JVD, rub or murmur. ?Abdomen.  Soft, nontender, nondistended, BS positive. ?CNS.  Alert and oriented .  No focal neurologic deficit. ?Extremities.  No edema, no cyanosis, pulses intact and symmetrical. ?Psychiatry.  Judgment and insight appears normal. ? ?Data Reviewed: ?Prior notes, and labs reviewed. ? ?Family Communication: Discussed with son on phone. ? ?Disposition: ?Status is: Inpatient ?Remains inpatient appropriate because: Severity of illness ? ? Planned Discharge Destination: Skilled nursing facility with hospice. ? ?DVT prophylaxis.  Lovenox ? ?Time spent: 50 minutes ? ?This record has been created using Systems analyst. Errors have been sought and corrected,but may not always be located. Such creation errors do not reflect on the standard of care. ? ?Author: ?Lorella Nimrod, MD ?01/12/2022 3:21 PM ? ?For on call review [email or web address].  ?

## 2022-01-12 NOTE — Assessment & Plan Note (Signed)
PT/OT are recommending SNF. ?-TOC is working on it, family is willing to go with copayment days. ?

## 2022-01-12 NOTE — Assessment & Plan Note (Signed)
Alcoholic liver cirrhosis with ascites with recent paracentesis: See above, mildly distended abdomen.  His home dose of torsemide and spironolactone was resumed but resulted in worsening hypotension. ?Renal function with mild worsening and lactic acid remained elevated. ?Patient underwent another paracentesis with removal of 4 L of transudative fluid. ?Patient did receive empiric ceftriaxone which was discontinued on 01/11/2022 as there was no sign of infection. ?-Keep holding torsemide and spironolactone. ?-Concern of hepatorenal. ?-Giving some IV fluid ?-Monitor lactic acid and renal function ? ?

## 2022-01-12 NOTE — TOC Progression Note (Signed)
Transition of Care (TOC) - Progression Note  ? ? ?Patient Details  ?Name: Luis [name]. ?MRN: [id number] ?Date of Birth: 1946/02/07 ? ?Transition of Care (TOC) CM/SW Contact  ?Dinora Hemm A Bayani Renteria, LCSW ?Phone Number: ?01/12/2022, 4:15 PM ? ?Clinical Narrative:   Pt's son working with SNF to determine payment plan ? ? ? ?  ?  ? ?Expected Discharge Plan and Services ?  ?  ?  ?  ?  ?                ?  ?  ?  ?  ?  ?  ?  ?  ?  ?  ? ? ?Social Determinants of Health (SDOH) Interventions ?  ? ?Readmission Risk Interventions ?No flowsheet data found. ? ?

## 2022-01-12 NOTE — Progress Notes (Signed)
Physical Therapy Treatment ?Patient Details ?Name: Luis [name]. ?MRN: [id number] ?DOB: 12-29-1945 ?Today's Date: 01/12/2022 ? ? 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  S/p L knee aspiration 01/01/22.  S/p arthroscopic irrigation debridement of L knee secondary septic L knee joint 01/02/22. PT has been re-consulted as pt is now s/p Left common femoral, superficial femoral & profunda femoris endarterectomy & Fogarty embolectomy of the left SFA and popliteal arteries on 01/08/22. ? ?  ?PT Comments  ? ? Patient tolerated session well and was agreeable to treatment. Upon entry into room patient was seated upright in bed finishing speech therapy session. Speech assisted throughout session due to increased lines. No pain reported throughout session, however patient does fatigue quickly. Session was completed on 2L O2 via [place]. Min A through HHA or assistance at BLEs was required for patient to complete supine<>sitting bed mobility. Once in sitting patient required long seated rest break due to fatigue. Sit to stand from EOB or arm chair was completed at CGA/SBA with RW. Patient was able to ambulate ~34fet with a long seated recovery break at ~172ft due to fatigue and decreased muscular strength/endurance. He was able to tolerate ~3 minutes of static standing while linens were changed on patient's bed at SBA/Sup. Patient is progressing nicely towards his goals, and would continue to benefit from skilled physical therapy in order to optimize patient's independence and safety with activities of daily living. Continue to recommend STR upon discharge from acute hospitalization.  ?  ?Recommendations for follow up therapy are one component of a multi-disciplinary discharge planning process, led by the attending physician.   Recommendations may be updated based on patient status, additional functional criteria and insurance authorization. ? ?Follow Up Recommendations ? Skilled nursing-short term rehab (<3 hours/day) ?  ?  ?Assistance Recommended at Discharge Frequent or constant Supervision/Assistance  ?Patient can return home with the following A little help with walking and/or transfers;A little help with bathing/dressing/bathroom;Assistance with cooking/housework;Assist for transportation;Help with stairs or ramp for entrance ?  ?Equipment Recommendations ? Rolling walker (2 wheels);BSC/3in1  ?  ?Recommendations for Other Services   ? ? ?  ?Precautions / Restrictions Precautions ?Precautions: Fall ?Restrictions ?Weight Bearing Restrictions: No  ?  ? ?Mobility ? Bed Mobility ?Overal bed mobility: Needs Assistance ?Bed Mobility: Supine to Sit, Sit to Supine ?  ?  ?Supine to sit: Min assist, HOB elevated (through HHA) ?Sit to supine: Min assist ([place]istance at [place]?  ?General bed mobility comments: required Max A +2 to scoot patient up into the bed ?Patient Response: Cooperative ? ?Transfers ?Overall transfer level: Needs assistance ?Equipment used: Rolling walker (2 wheels) ?Transfers: Sit to/from Stand ?Sit to Stand: Min guard (SBA) ?  ?  ?  ?  ?  ?General transfer comment: completed x3 sit to stand transfers ?  ? ?Ambulation/Gait ?Ambulation/Gait assistance: Min guard (SBA) ?Gait Distance (Feet): 48 Feet ?Assistive device: Rolling walker (2 wheels) ?Gait Pattern/deviations: Decreased step length - left, Decreased step length - right, Decreased dorsiflexion - right, Decreased dorsiflexion - left, Step-to pattern, Trunk flexed ?Gait velocity: decreased ?  ?  ?General Gait Details: Patient ambulated 48 feet with 1 seated recovery break at doorway (~16 feet) due to SOB and musuclar fatigue ? ? ?Stairs ?  ?  ?  ?  ?  ? ? ?  Wheelchair Mobility ?  ? ?Modified Rankin (Stroke Patients Only) ?  ? ? ?  ?Balance Overall balance assessment: Needs  assistance ?Sitting-balance support: Feet supported ?Sitting balance-Leahy Scale: Fair ?  ?Postural control:  (forward trunk lean, patient required intermittent cueing for upright posture) ?Standing balance support: During functional activity, Bilateral upper extremity supported, Reliant on assistive device for balance ?Standing balance-Leahy Scale: Fair ?Standing balance comment: BUE support on RW, fatigues quickly ?  ?  ?  ?  ?  ?  ?  ?  ?  ?  ?  ?  ? ?  ?Cognition Arousal/Alertness: Awake/alert ?Behavior During Therapy: [facility] for tasks assessed/performed ?Overall Cognitive Status: Within Functional Limits for tasks assessed ?  ?  ?  ?  ?  ?  ?  ?  ?  ?  ?  ?  ?  ?  ?  ?  ?General Comments: pleasant and cooperative ?  ?  ? ?  ?Exercises General Exercises - Lower Extremity ?Ankle Circles/Pumps: Supine, AROM, 20 reps, Both ?Other Exercises ?Other Exercises: static standing balance at EOB with BUE support from RW at SBA/SUP for bed change ~3 minutes ? ?  ?General Comments General comments (skin integrity, edema, etc.): SpO2 ranged from 92-95% on 2 L O2 via [place], HR ranged from 65-78bpm ?  ?  ? ?Pertinent Vitals/Pain Pain Assessment ?Pain Assessment: No/denies pain ?Pain Intervention(s): Monitored during session, Repositioned  ? ? ?Home Living   ?  ?  ?  ?  ?  ?  ?  ?  ?  ?   ?  ?Prior Function    ?  ?  ?   ? ?PT Goals (current goals can now be found in the care plan section) Acute Rehab PT Goals ?Patient Stated Goal: get better ?PT Goal Formulation: With patient ?Time For Goal Achievement: 01/23/22 ?Potential to Achieve Goals: Fair ?Progress towards PT goals: Progressing toward goals ? ?  ?Frequency ? ? ? Min 2X/week ? ? ? ?  ?PT Plan Current plan remains appropriate  ? ? ?Co-evaluation   ?  ?  ?  ?  ? ?  ?AM-PAC PT "6 Clicks" Mobility   ?Outcome Measure ? Help needed turning from your back to your side while in a flat bed without using bedrails?: A Little ?Help needed moving from lying on your back to sitting on the  side of a flat bed without using bedrails?: A Little ?Help needed moving to and from a bed to a chair (including a wheelchair)?: A Little ?Help needed standing up from a chair using your arms (e.g., wheelchair or bedside chair)?: A Little ?Help needed to walk in hospital room?: A Little ?Help needed climbing 3-5 steps with a railing? : A Lot ?6 Click Score: 17 ? ?  ?End of Session Equipment Utilized During Treatment: Oxygen;Gait belt ?Activity Tolerance: Patient tolerated treatment well;Patient limited by fatigue ?Patient left: in bed;with call bell/phone within reach ?Nurse Communication: Mobility status ?PT Visit Diagnosis: Difficulty in walking, not elsewhere classified (R26.2);History of falling (Z91.81);Unsteadiness on feet (R26.81);Muscle weakness (generalized) (M62.81) ?  ? ? ?Time: 8250-0370 ?PT Time Calculation (min) (ACUTE ONLY): 33 min ? ?Charges:  $Gait Training: 23-37 mins          ?          ? ?Iva Boop, PT  ?01/12/22. 2:45 PM ? ? ?

## 2022-01-12 NOTE — Assessment & Plan Note (Signed)
Mild worsening of creatinine to 2.26.  Baseline around 1.9-1.8. ?Patient was restarted on diuretic and developed hypotension. ?Concern of hepatorenal. ?-Give him 1 more liter of LR ?-Keep holding diuretics ?-Monitor renal function ?

## 2022-01-12 NOTE — Assessment & Plan Note (Signed)
Acute metabolic encephalopathy ?Generalized weakness/debility: ?Improved, now appear at his baseline. ?Patient presenting with confusion worse from baseline along with generalized weakness, work-up so far unremarkable for UTI pneumonia MRI brain and CT head no acute finding.  Likely multifactorial in the setting of hepatic encephalopathy. ?-PT/OT is recommending SNF-patient has only 3 days left. ?-Family is willing to use co-pay days-most likely will be going to SNF with hospice. ?-TOC is working on it. ?

## 2022-01-12 NOTE — Procedures (Signed)
PROCEDURE SUMMARY: ? ?Successful US guided paracentesis from RLQ.  ?Yielded 4 L of clear yellow fluid.  ?No immediate complications.  ?Pt tolerated well.  ? ?Specimen was sent for labs. ? ?EBL < 91m ? ?MTsosie BillingD PA-C ?01/12/2022 ?10:47 AM ? ? ? ?

## 2022-01-13 ENCOUNTER — Encounter: Payer: Self-pay | Admitting: Internal Medicine

## 2022-01-13 DIAGNOSIS — K7682 Hepatic encephalopathy: Secondary | ICD-10-CM | POA: Diagnosis not present

## 2022-01-13 LAB — COMPREHENSIVE METABOLIC PANEL
ALT: 8 U/L (ref 0–44)
AST: 36 U/L (ref 15–41)
Albumin: 2 g/dL — ABNORMAL LOW (ref 3.5–5.0)
Alkaline Phosphatase: 61 U/L (ref 38–126)
Anion gap: 9 (ref 5–15)
BUN: 42 mg/dL — ABNORMAL HIGH (ref 8–23)
CO2: 24 mmol/L (ref 22–32)
Calcium: 8.1 mg/dL — ABNORMAL LOW (ref 8.9–10.3)
Chloride: 102 mmol/L (ref 98–111)
Creatinine, Ser: 1.91 mg/dL — ABNORMAL HIGH (ref 0.61–1.24)
GFR, Estimated: 36 mL/min — ABNORMAL LOW (ref >60)
Glucose, Bld: 119 mg/dL — ABNORMAL HIGH (ref 70–99)
Potassium: 3.8 mmol/L (ref 3.5–5.1)
Sodium: 135 mmol/L (ref 135–145)
Total Bilirubin: 0.7 mg/dL (ref 0.3–1.2)
Total Protein: 6.1 g/dL — ABNORMAL LOW (ref 6.5–8.1)

## 2022-01-13 LAB — CYTOLOGY - NON PAP

## 2022-01-13 LAB — CULTURE, BLOOD (ROUTINE X 2)
Culture: NO GROWTH
Culture: NO GROWTH
Special Requests: ADEQUATE

## 2022-01-13 LAB — RESP PANEL BY RT-PCR (FLU A&B, COVID) ARPGX2
Influenza A by PCR: NEGATIVE
Influenza B by PCR: NEGATIVE
SARS Coronavirus 2 by RT PCR: NEGATIVE

## 2022-01-13 LAB — PROTIME-INR
INR: 1.4 — ABNORMAL HIGH (ref 0.8–1.2)
Prothrombin Time: 17 seconds — ABNORMAL HIGH (ref 11.4–15.2)

## 2022-01-13 LAB — LACTIC ACID, PLASMA: Lactic Acid, Venous: 1.5 mmol/L (ref 0.5–1.9)

## 2022-01-13 LAB — GLUCOSE, CAPILLARY: Glucose-Capillary: 119 mg/dL — ABNORMAL HIGH (ref 70–99)

## 2022-01-13 MED ORDER — CEFAZOLIN SODIUM-DEXTROSE 2-4 GM/100ML-% IV SOLN
2.0000 g | INTRAVENOUS | Status: DC
Start: 2022-01-14 — End: 2022-01-14
  Filled 2022-01-13: qty 100

## 2022-01-13 NOTE — Evaluation (Signed)
Physical Therapy Evaluation ?Patient Details ?Name: Luis [name]. ?MRN: [id number] ?DOB: 1946-04-20 ?Today's Date: 01/13/2022 ? ?History of Present Illness ? 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  S/p L knee aspiration 01/01/22.  S/p arthroscopic irrigation debridement of L knee secondary septic L knee joint 01/02/22. PT has been re-consulted as pt is now s/p Left common femoral, superficial femoral & profunda femoris endarterectomy & Fogarty embolectomy of the left SFA and popliteal arteries on 01/08/22.  ?Clinical Impression ? Pt pleasant and willing to work with PT, however struggled with all aspects of mobility needing assist with bed mobility and most notedly with getting to standing from multiple surfaces.  Pt with slow but safe ambulation, relatively quick to fatigue, O2 drops to mid 80s during activity on 2L.   ?   ? ?Recommendations for follow up therapy are one component of a multi-disciplinary discharge planning process, led by the attending physician.  Recommendations may be updated based on patient status, additional functional criteria and insurance authorization. ? ?Follow Up Recommendations Skilled nursing-short term rehab (<3 hours/day) ? ?  ?Assistance Recommended at Discharge Frequent or constant Supervision/Assistance  ?Patient can return home with the following ? A little help with walking and/or transfers;A little help with bathing/dressing/bathroom;Assistance with cooking/housework;Assist for transportation;Help with stairs or ramp for entrance ? ?  ?Equipment Recommendations Rolling walker (2 wheels);BSC/3in1  ?Recommendations for Other Services ?    ?  ?Functional Status Assessment    ? ?  ?Precautions / Restrictions Precautions ?Precautions: Fall ?Restrictions ?Weight Bearing Restrictions: No  ? ?  ? ?Mobility ? Bed  Mobility ?Overal bed mobility: Needs Assistance ?Bed Mobility: Supine to Sit ?  ?  ?Supine to sit: Min assist, Mod assist ?  ?  ?General bed mobility comments: Pt made good effort to get to EOB but ultimately needed heavy UE support to pull toward and to get to sitting EOB ?  ? ?Transfers ?Overall transfer level: Needs assistance ?Equipment used: Rolling walker (2 wheels) ?Transfers: Sit to/from Stand ?Sit to Stand: Mod assist ?  ?  ?  ?  ?  ?General transfer comment: Pt was unable to rise from standing on his own from mutiple surfaces.  He needed direct assist with rising from elevated bed and from recliner. ?  ? ?Ambulation/Gait ?Ambulation/Gait assistance: Min guard ?Gait Distance (Feet): 45 Feet ?Assistive device: Rolling walker (2 wheels) ?  ?  ?  ?  ?General Gait Details: Pt was able to ambulate ~45 ft X 2 with prolonged seated rest break in between.  Pt on 2L O2 t/o the effort with sats dropping from mid 90s to high 80s with subjective fatigue. ? ?Stairs ?  ?  ?  ?  ?  ? ?Wheelchair Mobility ?  ? ?Modified Rankin (Stroke Patients Only) ?  ? ?  ? ?Balance Overall balance assessment: Needs assistance ?Sitting-balance support: Feet supported ?Sitting balance-Leahy Scale: Fair ?  ?  ?Standing balance support: Bilateral upper extremity supported ?Standing balance-Leahy Scale: Fair ?Standing balance comment: BUE support on RW, fatigues quickly ?  ?  ?  ?  ?  ?  ?  ?  ?  ?  ?  ?   ? ? ? ?Pertinent Vitals/Pain Pain Assessment ?Pain Assessment: No/denies pain  ? ? ?Home Living   ?  ?  ?  ?  ?  ?  ?  ?  ?  ?   ?  ?  Prior Function   ?  ?  ?  ?  ?  ?  ?  ?  ?  ? ? ?Hand Dominance  ?   ? ?  ?Extremity/Trunk Assessment  ?   ?  ? ?  ?  ? ?   ?Communication  ?    ?Cognition Arousal/Alertness: Awake/alert ?Behavior During Therapy: [facility] for tasks assessed/performed ?Overall Cognitive Status: Within Functional Limits for tasks assessed ?  ?  ?  ?  ?  ?  ?  ?  ?  ?  ?  ?  ?  ?  ?  ?  ?  ?  ?  ? ?  ?General Comments General  comments (skin integrity, edema, etc.): Pt eager to do some activity but struggled with most aspects, especially with rising to standing from multiple surfaces ? ?  ?Exercises    ? ?Assessment/Plan  ?  ?PT Assessment    ?PT Problem List   ? ?   ?  ?PT Treatment Interventions     ? ?PT Goals (Current goals can be found in the Care Plan section)  ?  ? ?  ?Frequency Min 2X/week ?  ? ? ?Co-evaluation   ?  ?  ?  ?  ? ? ?  ?AM-PAC PT "6 Clicks" Mobility  ?Outcome Measure Help needed turning from your back to your side while in a flat bed without using bedrails?: A Little ?Help needed moving from lying on your back to sitting on the side of a flat bed without using bedrails?: A Little ?Help needed moving to and from a bed to a chair (including a wheelchair)?: A Little ?Help needed standing up from a chair using your arms (e.g., wheelchair or bedside chair)?: A Lot ?Help needed to walk in hospital room?: A Little ?Help needed climbing 3-5 steps with a railing? : A Lot ?6 Click Score: 16 ? ?  ?End of Session Equipment Utilized During Treatment: Oxygen;Gait belt ?Activity Tolerance: Patient tolerated treatment well;Patient limited by fatigue ?Patient left: in bed;with call bell/phone within reach ?Nurse Communication: Mobility status ?PT Visit Diagnosis: Difficulty in walking, not elsewhere classified (R26.2);History of falling (Z91.81);Unsteadiness on feet (R26.81);Muscle weakness (generalized) (M62.81) ?  ? ?Time: 1104-1130 ?PT Time Calculation (min) (ACUTE ONLY): 26 min ? ? ?Charges:     ?PT Treatments ?$Gait Training: 8-22 mins ?$Therapeutic Activity: 8-22 mins ?  ?   ? ? ?Kreg Shropshire, DPT ?01/13/2022, 1:59 PM ?

## 2022-01-13 NOTE — Progress Notes (Signed)
Yellow mews. CN and On call MD notified. Pt asymptomatic. BP recheck is 83/41, which is not new for the pt. Pt received 1 L of LR fluid during the day and getting midodrine 10 mg TID. Metoprolol held for this shift. Increased vital signs check implemented.  ? ? 01/13/22 0010  ?Assess: MEWS Score  ?Temp 97.7 ?F (36.5 ?C)  ?BP (!) 79/36  ?Pulse Rate 60  ?ECG Heart Rate 61  ?Resp (!) 22  ?Level of Consciousness Alert  ?SpO2 98 %  ?O2 Device Nasal Cannula  ?Patient Activity (if Appropriate) In bed  ?O2 Flow Rate (L/min) 2 L/min  ?Assess: MEWS Score  ?MEWS Temp 0  ?MEWS Systolic 2  ?MEWS Pulse 0  ?MEWS RR 1  ?MEWS LOC 0  ?MEWS Score 3  ?MEWS Score Color Yellow  ?Assess: if the MEWS score is Yellow or Red  ?Were vital signs taken at a resting state? Yes  ?Focused Assessment No change from prior assessment  ?Does the patient meet 2 or more of the SIRS criteria? No  ?MEWS guidelines implemented *See Row Information* No, vital signs rechecked  ?Treat  ?MEWS Interventions Other (Comment)  ?Pain Scale 0-10  ?Pain Score 0  ?Take Vital Signs  ?Increase Vital Sign Frequency  Yellow: Q 2hr X 2 then Q 4hr X 2, if remains yellow, continue Q 4hrs  ?Escalate  ?MEWS: Escalate Yellow: discuss with charge nurse/RN and consider discussing with provider and RRT  ?Notify: Charge Nurse/RN  ?Name of Charge Nurse/RN Notified Kennyth Lose, RN  ?Date Charge Nurse/RN Notified 01/13/22  ?Time Charge Nurse/RN Notified 0200  ?Notify: Provider  ?Provider Name/Title Dr. Sabino Gasser  ?Date Provider Notified 01/13/22  ?Time Provider Notified 0200  ?Notification Type Page  ?Notification Reason Other (Comment) ?(yellow mews)  ?Provider response No new orders  ?Date of Provider Response 01/13/22  ?Time of Provider Response 0210  ?Document  ?Patient Outcome Other (Comment) ?(yellow mews, Increase monitor)  ?Progress note created (see row info) Yes  ?Assess: SIRS CRITERIA  ?SIRS Temperature  0  ?SIRS Pulse 0  ?SIRS Respirations  1  ?SIRS WBC 0  ?SIRS Score Sum  1   ? ? ?

## 2022-01-13 NOTE — Progress Notes (Signed)
?  Chaplain On-Call was asked by RN Daquanda to visit the patient and his son. ? ?Chaplain met patient and son Aaron Edelman, and provided listening support as they discussed the Advance Directives documents that the patient wants to complete. ? ?Chaplain encouraged further discussion as the patient makes his choices. ? ?Chaplain described the process for completion, stating that they can ask the Nurse to page the [facility] when ready for completion. ? ?Chaplain Pollyann Samples ?M.Div., [facility] ?

## 2022-01-13 NOTE — Assessment & Plan Note (Signed)
Resolved. ?Likely from decreased clearance with her liver cirrhosis. ?-Keep encouraging p.o. hydration ?

## 2022-01-13 NOTE — Care Management Important Message (Signed)
Important Message ? ?Patient Details  ?Name: Luis [name]. ?MRN: [id number] ?Date of Birth: [birth year] ? ? ?Medicare Important Message Given:  Yes ? ? ? ? ?[name] ?01/13/2022, 11:03 AM ?

## 2022-01-13 NOTE — TOC Progression Note (Addendum)
Transition of Care (TOC) - Progression Note  ? ? ?Patient Details  ?Name: Mannix Kroeker. ?MRN: [id number] ?Date of Birth: 1945-11-10 ? ?Transition of Care (TOC) CM/SW Contact  ?Lennix Kneisel A Wilene Pharo, LCSW ?Phone Number: ?01/13/2022, 9:40 AM ? ?Clinical Narrative:   CSW spoke with pt's son and provided bed offer at [facility]. Pt's son would prefer pt go to [facility]. Per pt's son he is agreeable for private pay after insurance stops paying. Pt's son to work with finance office at SNF to work this out. CSW has reached out to SNF to determine progress in this. ? ?Spoke with [facility] they are going to get the finances settled with pt's son. Neoma Laming is going to call son Aaron Edelman.  ? ? ? ?  ?  ? ?Expected Discharge Plan and Services ?  ?  ?  ?  ?  ?                ?  ?  ?  ?  ?  ?  ?  ?  ?  ?  ? ? ?Social Determinants of Health (SDOH) Interventions ?  ? ?Readmission Risk Interventions ?No flowsheet data found. ? ?

## 2022-01-13 NOTE — Progress Notes (Signed)
?   01/13/22 1900  ?Clinical Encounter Type  ?Visited With Patient and family together  ?Visit Type Social support  ?Referral From Nurse  ?Consult/Referral To Chaplain  ?Spiritual Encounters  ?Spiritual Needs Other (Comment) ?(Advance Directive support)  ? ?Chaplain gave further assistance toward the completion of an Advanced Directive ?

## 2022-01-13 NOTE — Assessment & Plan Note (Signed)
Acute metabolic encephalopathy ?Generalized weakness/debility: ?Improved, now appear at his baseline. ?Patient presenting with confusion worse from baseline along with generalized weakness, work-up so far unremarkable for UTI pneumonia MRI brain and CT head no acute finding.  Likely multifactorial in the setting of hepatic encephalopathy. ?-PT/OT is recommending SNF-patient has only 3 days left. ?-Family made arrangement to see it with hospice services at a facility. ?-Should be able to go tomorrow after Pleurx catheter placement ?-TOC is working on it. ?

## 2022-01-13 NOTE — Consult Note (Addendum)
Chief Complaint: Patient was seen in consultation today for recurrent symptomatic ascites at the request of Lorella Nimrod, MD  Referring Physician(s): Lorella Nimrod, MD  Supervising Physician: Juliet Rude  Patient Status: [place] - In-pt  History of Present Illness: Luis Ashmead. is a 76 y.o. male with PMHx of multiple myeloma, CAD s/p CABG, CHF, DM, CKD, generalized weakness, recent admission for MSSA sepsis from skin infection now resolved, alcoholic cirrhosis with recurrent ascites and s/p multiple paracentesis difficult to diurese given renal function and hypotension. Patient will be discharging home to SNF with hospice services and request received for image guided tunneled peritoneal pleurx catheter placement for better symptom management of his ascites. The patient and his son are present today during my visit and both wish to proceed with the catheter placement given the patient's decline and weakness so this can be better managed at his SNF with hospice. The patient denies any current chest pain or shortness of breath. He has no known complications to sedation.    Past Medical History:  Diagnosis Date   Angina pectoris ([place])    Anxiety    Arthritis    CHF (congestive heart failure) ([place])    Coronary artery disease    Depression    Diabetes mellitus without complication ([place])    Patient takes Metformin.   Diverticulosis    GERD (gastroesophageal reflux disease)    Hyperlipidemia    Hypertension    Multiple myeloma ([place])    Multiple myeloma ([place]) 03/27/2015   Myocardial infarction [facility]) April 2001   widowmaker   Shortness of breath dyspnea    Sleep apnea    No CPAP @ present   Spinal stenosis    Squamous cell carcinoma of skin 02/19/2014   Left dorsal hand. WD SCC with superficial infiltration. Re-shaved 04/23/2014. Re-shaved 09/05/2014.   Squamous cell carcinoma of skin 09/08/2020   Left ant scalp. WD SCC.   Squamous cell carcinoma of skin 02/04/2021   R  hand dorsum, EDC     Past Surgical History:  Procedure Laterality Date   CARDIAC CATHETERIZATION     CARPAL TUNNEL RELEASE     CATARACT EXTRACTION     COLONOSCOPY WITH PROPOFOL N/A 11/11/2015   Procedure: COLONOSCOPY WITH PROPOFOL;  Surgeon: Lollie Sails, MD;  [facility];  Service: Endoscopy;  Laterality: N/A;   CORONARY ARTERY BYPASS GRAFT     EYE SURGERY Bilateral    Cataract Extraction   INGUINAL HERNIA REPAIR     JOINT REPLACEMENT Right 2008   Right Total Hip Replacement   PILONIDAL CYST EXCISION     ROTATOR CUFF REPAIR     TOTAL HIP ARTHROPLASTY Right    VENTRAL HERNIA REPAIR N/A 08/15/2015   Procedure: VENTRAL HERNIA REPAIR WITH MESH ;  Surgeon: Leonie Green, MD;  Location: [facility] ORS;  Service: General;  Laterality: N/A;    Allergies: Pravastatin sodium, Statins, and Zocor [simvastatin]  Medications: Prior to Admission medications   Medication Sig Start Date End Date Taking? Authorizing Provider  aspirin 81 MG tablet Take 81 mg by mouth daily.   Yes [provider]  Bee Pollen 500 MG CHEW Chew 1 tablet by mouth 2 (two) times daily.   Yes [provider]  Calcium Carbonate-Vitamin D 600-400 MG-UNIT chew tablet Chew 1 tablet by mouth 2 (two) times daily.    Yes [provider]  loratadine (CLARITIN) 10 MG tablet Take 1 tablet (10 mg total) by mouth daily. 12/18/21  Yes Lorella Nimrod, MD  lovastatin (MEVACOR) 40 MG tablet Take 40 mg by mouth at bedtime.    Yes [provider]  metFORMIN (GLUCOPHAGE) 500 MG tablet Take 500 mg by mouth daily with breakfast.   Yes [provider]  midodrine (PROAMATINE) 2.5 MG tablet Take 1 tablet (2.5 mg total) by mouth 3 (three) times daily with meals. 12/18/21  Yes Lorella Nimrod, MD  Multiple Vitamin (MULTIVITAMIN WITH MINERALS) TABS tablet Take 1 tablet by mouth daily. 12/18/21  Yes Lorella Nimrod, MD  omeprazole (PRILOSEC) 20 MG capsule Take 20 mg by mouth daily.   Yes [provider]  PARoxetine (PAXIL) 10 MG tablet Take 10 mg by mouth daily.   Yes [provider]  QUEtiapine (SEROQUEL) 25 MG tablet Take 25 mg by mouth at bedtime.   Yes [provider]  tamsulosin (FLOMAX) 0.4 MG CAPS capsule Take 0.4 mg by mouth daily. 12/31/21  Yes [provider]  acetaminophen (TYLENOL) 325 MG tablet Take 2 tablets (650 mg total) by mouth every 6 (six) hours as needed for mild pain (or Fever >/= 101). 12/18/21   Lorella Nimrod, MD  feeding supplement (ENSURE ENLIVE / ENSURE PLUS) LIQD Take 237 mLs by mouth 2 (two) times daily between meals. 12/18/21   Lorella Nimrod, MD  ipratropium-albuterol (DUONEB) 0.5-2.5 (3) MG/3ML SOLN Take 3 mLs by nebulization every 6 (six) hours as needed. Patient not taking: Reported on 02/05/2021 11/13/20   Enzo Bi, MD  Nutritional Supplements (,FEEDING SUPPLEMENT, PROSOURCE PLUS) liquid Take 30 mLs by mouth 3 (three) times daily between meals. 12/18/21   Lorella Nimrod, MD  spironolactone (ALDACTONE) 25 MG tablet Take 25 mg by mouth daily. 12/31/21   [provider]  torsemide (DEMADEX) 20 MG tablet Take 20 mg by mouth daily. 12/31/21   [provider]  VENTOLIN HFA 108 (90 Base) MCG/ACT inhaler Inhale 2 puffs into the lungs every 6 (six) hours as needed for wheezing. 10/31/21   [provider]     Family History  Problem Relation Age of Onset   Heart disease Father    Stroke Mother    Prostate cancer Maternal Grandfather 18    Social History   Socioeconomic History   Marital status: Married    Spouse name: Not on file   Number of children: Not on file   Years of education: Not on file   Highest education level: Not on file  Occupational History   Not on file  Tobacco Use   Smoking status: Former    Packs/day: 1.50    Years: 40.00    Pack years: 60.00    Types: Cigarettes    Quit date: 02/24/2000    Years since quitting: 21.9   Smokeless tobacco: Former    Quit date: 03/04/2000   Vaping Use   Vaping Use: Never used  Substance and Sexual Activity   Alcohol use: No   Drug use: No   Sexual activity: Not on file  Other Topics Concern   Not on file  Social History Narrative   Not on file   Social Determinants of Health   Financial Resource Strain: Not on file  Food Insecurity: Not on file  Transportation Needs: Not on file  Physical Activity: Not on file  Stress: Not on file  Social Connections: Not on file    Review of Systems: A 12 point ROS discussed and pertinent positives are indicated in the HPI above.  All other systems are negative.  Review of Systems  Vital Signs: BP (!) 95/46 (BP Location: Right Arm)    Pulse 65    Temp 97.8 F (36.6 C)    Resp 15    Ht '5\' 6"'  (1.676 m)    Wt 178 lb 9.2 oz (81 kg)    SpO2 99%    BMI 28.82 kg/m   Physical Exam Constitutional:      Appearance: He is ill-appearing.     Comments: Son is bedside  Cardiovascular:     Rate and Rhythm: Normal rate.  Pulmonary:     Effort: Pulmonary effort is normal. No respiratory distress.  Abdominal:     General: There is distension.     Palpations: Abdomen is soft.  Skin:    General: Skin is warm and dry.  Neurological:     Mental Status: He is alert and oriented to person, place, and time.    Imaging: CT ABDOMEN PELVIS WO CONTRAST  Result Date: 12/27/2021 CLINICAL DATA:  Abdominal pain for 1 week, ascites, history of multiple myeloma EXAM: CT ABDOMEN AND PELVIS WITHOUT CONTRAST TECHNIQUE: Multidetector CT imaging of the abdomen and pelvis was performed following the standard protocol without IV contrast. RADIATION DOSE REDUCTION: This exam was performed according to the departmental dose-optimization program which includes automated exposure control, adjustment of the mA and/or kV according to patient size and/or use of iterative reconstruction technique. COMPARISON:  CT abdomen pelvis, 07/15/2015, CT lumbar spine, 11/07/2020 FINDINGS: Lower chest: Small, chronic, loculated  appearing bilateral pleural effusion with associated round atelectasis and scarring. Coronary artery calcifications. Hepatobiliary: No solid liver abnormality is seen. Coarse, nodular cirrhotic morphology of the liver. Gallstone contracted in the gallbladder. No biliary dilatation. Pancreas: Unremarkable. No pancreatic ductal dilatation or surrounding inflammatory changes. Spleen: Normal in size without significant abnormality. Adrenals/Urinary Tract: Adrenal glands are unremarkable. Kidneys are normal, without renal calculi, solid lesion, or hydronephrosis. Bladder is unremarkable. Stomach/Bowel: Stomach is within normal limits. Appendix is not clearly visualized. Wall thickening and mural fatty stratification of the cecum and proximal transverse colon (series 2, image 55). Descending and sigmoid diverticulosis. Vascular/Lymphatic: Aortic atherosclerosis. No enlarged abdominal or pelvic lymph nodes. Reproductive: No mass or other significant abnormality. Other: Large, ascites fluid containing right-sided spigelian hernia (series 2, image 70). Small, fat and fluid containing bilateral inguinal hernias. Large volume ascites throughout the abdomen and pelvis. Anasarca. Musculoskeletal: No acute osseous findings. Unchanged wedge deformity of the T11 vertebral body. Numerous lytic lesions throughout the spine and pelvis (series 6, image 85, series 2, image 63). Status post right hip total arthroplasty. IMPRESSION: 1. Large volume ascites throughout the abdomen and pelvis. 2. Cirrhosis. 3. Wall thickening and mural fatty stratification of the cecum and proximal transverse colon, likely portal colopathy in the setting of ascites and portal hypertension. 4. Gallstones contracted in the gallbladder. 5. Numerous lytic lesions throughout the spine and pelvis, consistent with history of multiple myeloma and unchanged compared to prior imaging of the lumbar spine dated 11/07/2020. 6. Coronary artery disease. Aortic  Atherosclerosis (ICD10-I70.0). Electronically Signed   By: Delanna Ahmadi M.D.   On: 12/27/2021 16:11   DG Chest 1 View  Result Date: 12/28/2021 CLINICAL DATA:  Check PICC line placement. EXAM: CHEST  1 VIEW COMPARISON:  December 14, 2021 FINDINGS: Multiple sternal wires are seen. A right-sided PICC line is noted with its distal tip overlying the proximal to midportion of the superior vena cava. This is at the level of the aortic arch. Mild, chronic appearing increased lung markings  are seen with ill-defined areas of bilateral scarring and/or atelectasis. Left apical pleural thickening is noted. There is no evidence of a pleural effusion or pneumothorax. The heart size and mediastinal contours are within normal limits. Marked severity calcification of the aortic arch is seen. There are several chronic left-sided rib fractures. Multilevel degenerative changes are noted throughout the thoracic spine. IMPRESSION: Right-sided PICC line positioning, as described above Electronically Signed   By: Virgina Norfolk M.D.   On: 12/28/2021 02:24   CT HEAD WO CONTRAST (5MM)  Result Date: 01/08/2022 CLINICAL DATA:  Mental status change EXAM: CT HEAD WITHOUT CONTRAST TECHNIQUE: Contiguous axial images were obtained from the base of the skull through the vertex without intravenous contrast. RADIATION DOSE REDUCTION: This exam was performed according to the departmental dose-optimization program which includes automated exposure control, adjustment of the mA and/or kV according to patient size and/or use of iterative reconstruction technique. COMPARISON:  12/01/2021 FINDINGS: Brain: Mild atrophy. No evidence of acute infarction, hemorrhage, hydrocephalus, extra-axial collection or mass lesion/mass effect. Vascular: Atherosclerotic and physiologic intracranial calcifications. Skull: Normal. Negative for fracture or focal lesion. Sinuses/Orbits: No acute finding. Other: Decrease in right frontal scalp soft tissue swelling.  IMPRESSION: Negative for bleed orother acute intracranial process Electronically Signed   By: Lucrezia Europe M.D.   On: 01/08/2022 09:55   MR BRAIN WO CONTRAST  Result Date: 01/08/2022 CLINICAL DATA:  Transient ischemic attack. EXAM: MRI HEAD WITHOUT CONTRAST TECHNIQUE: Multiplanar, multiecho pulse sequences of the brain and surrounding structures were obtained without intravenous contrast. COMPARISON:  Head CT January 08, 2022 FINDINGS: The study is degraded by motion and patient positioning in the scanner. Brain: No acute infarction, hemorrhage, hydrocephalus, extra-axial collection or mass lesion. Scattered foci of T2 hyperintensity are seen within the white matter of the cerebral hemispheres, nonspecific, most likely related to chronic small vessel ischemia. Vascular: Normal flow voids. Skull and upper cervical spine: Normal marrow signal. Sinuses/Orbits: Negative. Other: None. IMPRESSION: 1. No acute intracranial abnormality. 2. Mild chronic microvascular ischemic changes of the white matter. Electronically Signed   By: Pedro Earls M.D.   On: 01/08/2022 12:55   [place] CHEST (PLEURAL EFFUSION)  Result Date: 12/14/2021 CLINICAL DATA:  Pleural effusion EXAM: CHEST ULTRASOUND COMPARISON:  None. FINDINGS: Limited ultrasound exam of the right chest was performed. Images demonstrate a small pleural effusion, not amenable for safe image guided thoracentesis. No thoracentesis performed. IMPRESSION: Small right pleural effusion.  No thoracentesis performed. Electronically Signed   By: Albin Felling M.D.   On: 12/14/2021 15:45   US Abdomen Limited  Result Date: 01/11/2022 CLINICAL DATA:  Ascites EXAM: LIMITED ABDOMEN ULTRASOUND FOR ASCITES TECHNIQUE: Limited ultrasound survey for ascites was performed in all four abdominal quadrants. COMPARISON:  CT 12/27/2021 FINDINGS: There is ascites present in the right upper, right lower, and left lower quadrants. IMPRESSION: Abdominal ascites present in the right  upper, right lower, and left lower quadrants. Electronically Signed   By: Maurine Simmering M.D.   On: 01/11/2022 16:36   US Paracentesis  Result Date: 01/12/2022 INDICATION: Ascites, request received for diagnostic and therapeutic paracentesis. EXAM: ULTRASOUND GUIDED PARACENTESIS MEDICATIONS: Lidocaine 1% local only. COMPLICATIONS: None immediate. PROCEDURE: Informed written consent was obtained from the patient after a discussion of the risks, benefits and alternatives to treatment. A timeout was performed prior to the initiation of the procedure. Initial ultrasound scanning demonstrates a large amount of ascites within the right lower abdominal quadrant. The right lower abdomen was prepped and draped  in the usual sterile fashion. 1% lidocaine was used for local anesthesia. Following this, a 19 gauge, 7-cm, Yueh catheter was introduced. An ultrasound image was saved for documentation purposes. The paracentesis was performed. The catheter was removed and a dressing was applied. The patient tolerated the procedure well without immediate post procedural complication. Patient received post-procedure intravenous albumin; see nursing notes for details. FINDINGS: A total of approximately 4 Liter of clear yellow fluid was removed. Samples were sent to the laboratory as requested by the clinical team. IMPRESSION: Successful ultrasound-guided paracentesis yielding 4 liters of peritoneal fluid. This exam was performed by Tsosie Billing PA-C, and was supervised and interpreted by Dr. Pascal Lux. Electronically Signed   By: Sandi Mariscal M.D.   On: 01/12/2022 12:57   US Paracentesis  Result Date: 12/15/2021 INDICATION: Patient with ascites presents today for a diagnostic and therapeutic paracentesis. EXAM: ULTRASOUND GUIDED PARACENTESIS MEDICATIONS: 1% lidocaine 10 mL COMPLICATIONS: None immediate. PROCEDURE: Informed written consent was obtained from the patient after a discussion of the risks, benefits and alternatives to  treatment. A timeout was performed prior to the initiation of the procedure. Initial ultrasound scanning demonstrates a large amount of ascites within the right lower abdominal quadrant. The right lower abdomen was prepped and draped in the usual sterile fashion. 1% lidocaine was used for local anesthesia. Following this, a 19 gauge, 7-cm, Yueh catheter was introduced. An ultrasound image was saved for documentation purposes. The paracentesis was performed. The catheter was removed and a dressing was applied. The patient tolerated the procedure well without immediate post procedural complication. FINDINGS: A total of approximately 2.7 L of clear yellow fluid was removed. Samples were sent to the laboratory as requested by the clinical team. IMPRESSION: Successful ultrasound-guided paracentesis yielding 2.7 liters of peritoneal fluid. Read by: Soyla Dryer, NP Electronically Signed   By: Jerilynn Mages.  Shick M.D.   On: 12/15/2021 13:14   DG Chest Port 1 View  Result Date: 01/08/2022 CLINICAL DATA:  Questionable sepsis - evaluate for abnormality EXAM: PORTABLE CHEST 1 VIEW COMPARISON:  12/28/2021 FINDINGS: Chronic interstitial changes. Chronic trace of pleural effusion. Left basilar atelectasis/scarring. Stable cardiomediastinal contours. IMPRESSION: Chronic interstitial changes without definite acute abnormality. Electronically Signed   By: Macy Mis M.D.   On: 01/08/2022 08:29   [place] EKG SITE RITE  Result Date: 12/15/2021 If [facility] image not attached, placement could not be confirmed due to current cardiac rhythm.   Labs:  CBC: Recent Labs    12/27/21 1437 12/30/21 1126 01/08/22 0809 01/10/22 0639  WBC 5.7 5.0 4.6 4.1  HGB 9.8* 9.3* 10.2* 9.0*  HCT 30.8* 29.1* 31.4* 27.4*  PLT 202 194 178 132*    COAGS: Recent Labs    12/09/21 1208 12/10/21 0621 12/30/21 1126 01/08/22 0809 01/13/22 0448  INR 1.5* 1.5* 1.4* 1.3* 1.4*  APTT 36  --   --  34  --     BMP: Recent Labs    01/09/22 0811  01/11/22 0445 01/12/22 0445 01/13/22 0448  NA 146* 139 136 135  K 3.4* 3.5 3.3* 3.8  CL 108 104 102 102  CO2 '27 25 24 24  ' GLUCOSE 88 97 113* 119*  BUN 43* 44* 42* 42*  CALCIUM 7.9* 7.5* 7.9* 8.1*  CREATININE 1.70* 2.05* 2.26* 1.91*  GFRNONAA 41* 33* 29* 36*    LIVER FUNCTION TESTS: Recent Labs    01/08/22 0809 01/09/22 0811 01/11/22 0445 01/12/22 0445 01/13/22 0448  BILITOT 2.4* 1.6* 0.6  --  0.7  AST  58* 40 41  --  36  ALT '5 7 8  ' --  8  ALKPHOS 61 53 47  --  61  PROT 8.1 6.9 6.2*  --  6.1*  ALBUMIN 2.6* 2.2* 1.9* 2.0* 2.0*    Assessment and Plan: This is a 76 year old male with PMHx of multiple myeloma, CAD s/p CABG, CHF, DM, CKD, generalized weakness, recent admission for MSSA sepsis from skin infection now resolved, alcoholic cirrhosis with recurrent ascites and s/p multiple paracentesis difficult to diurese given renal function and hypotension. Patient will be discharging home to SNF with hospice services and request received for image guided tunneled peritoneal pleurx catheter placement for better symptom management of his ascites.  The patient will be NPO after midnight, no blood thinners taken, imaging, labs and vitals have been reviewed and approved by my attending, Dr. Denna Haggard, Ancef 2G pre-procedure ordered on call to radiology.   Risks and benefits of image guided peritoneal pleurx catheter placement with moderate sedation was discussed with the patient and the patient's son including, but not limited to bleeding, infection, sepsis, not enough fluid and unable to place catheter, damage to adjacent structures.  All of the questions were answered and there is agreement to proceed.  Consent signed and in chart.   Thank you for this interesting consult.  I greatly enjoyed meeting Luis [name]. and look forward to participating in their care.  A copy of this report was sent to the requesting provider on this date.  Electronically Signed: Hedy Jacob,  PA-C 01/13/2022, 3:09 PM   I spent a total of 20 Minutes in face to face in clinical consultation, greater than 50% of which was counseling/coordinating care for end stage cirrhosis with recurrent symptomatic ascites.

## 2022-01-13 NOTE — Progress Notes (Signed)
[facility]) Hospital Liaison Note ?  ?Per TOC/Bridget, plan is for patient to transition to [facility] (possibly today) once finances are finalized. [facility] staff made aware of above updates ? ?Please send signed and completed DNR home with patient/family. Please provide prescriptions at discharge as needed to ensure ongoing symptom management.  ?  ?Please call with any questions/concerns.  ?  ?Thank you for the opportunity to participate in this patient's care. ?  ?Daphene Calamity, MSW ?[place]  ?[phone] ?

## 2022-01-13 NOTE — Assessment & Plan Note (Signed)
Creatinine started improving, now around baseline.  Baseline around 1.9-1.8. ?Patient was restarted on diuretic and developed hypotension. ?Concern of hepatorenal. ?-Patient is unable to tolerate diuretics with development of hypotension and AKI. ?-Monitor renal function ?

## 2022-01-13 NOTE — Progress Notes (Signed)
Occupational Therapy Treatment ?Patient Details ?Name: Luis [name]. ?MRN: [id number] ?DOB: [birth year] ?Today's Date: 01/13/2022 ? ? 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  S/p L knee aspiration 01/01/22.  S/p arthroscopic irrigation debridement of L knee secondary septic L knee joint 01/02/22. PT has been re-consulted as pt is now s/p Left common femoral, superficial femoral & profunda femoris endarterectomy & Fogarty embolectomy of the left SFA and popliteal arteries on 01/08/22. ?  ?OT comments ? Luis [name] was seen for OT treatment on this date. Upon arrival to room pt reclined in chair, family at bedside, agreeable to tx. Pt requires MOD A + RW for ADL t/f, increased time to recover from desat low 80s on 2L [place] with activity. SBA + RW for toothbrushing standing sink side. MAX A doff B socks seated EOB, posterior lean with dynamic activity. Pt making progress toward goals. Pt continues to benefit from skilled OT services to maximize return to PLOF and minimize risk of future falls, injury, caregiver burden, and readmission. Will continue to follow POC. Discharge recommendation remains appropriate.  ?  ? ?Recommendations for follow up therapy are one component of a multi-disciplinary discharge planning process, led by the attending physician.  Recommendations may be updated based on patient status, additional functional criteria and insurance authorization. ?   ?Follow Up Recommendations ? Skilled nursing-short term rehab (<3 hours/day)  ?  ?Assistance Recommended at Discharge Frequent or constant Supervision/Assistance  ?Patient can return home with the following ? A lot of help with walking and/or transfers;A lot of help with bathing/dressing/bathroom ?  ?Equipment Recommendations ? None recommended by OT  ?   ?Recommendations for Other Services   ? ?  ?Precautions / Restrictions Precautions ?Precautions: Fall ?Restrictions ?Weight Bearing Restrictions: No  ? ? ?  ? ?Mobility Bed Mobility ?Overal bed mobility: Needs Assistance ?Bed Mobility: Sit to Supine ?  ?  ?  ?Sit to supine: Min assist ?  ?  ?  ? ?Transfers ?Overall transfer level: Needs assistance ?Equipment used: Rolling walker (2 wheels) ?Transfers: Sit to/from Stand ?Sit to Stand: Mod assist ?  ?  ?  ?  ?  ?  ?  ?  ?Balance Overall balance assessment: Needs assistance ?Sitting-balance support: Feet supported ?Sitting balance-Luis [name]: Fair ?  ?  ?Standing balance support: No upper extremity supported, During functional activity ?Standing balance-Luis [name]: Fair ?  ?  ?  ?  ?  ?  ?  ?  ?  ?  ?  ?  ?   ? ?ADL either performed or assessed with clinical judgement  ? ?ADL Overall ADL's : Needs assistance/impaired ?  ?  ?  ?  ?  ?  ?  ?  ?  ?  ?  ?  ?  ?  ?  ?  ?  ?  ?  ?General ADL Comments: MOD A + RW for ADL t/f, increased time to recover from desat low 80s on 2L [place] with activity. SBA + RW for toothbrushing standing sink side. MAX A doff B socks seated EOB, posterior lean with dynamic activity. ?  ? ? ? ?Cognition Arousal/Alertness: Awake/alert ?Behavior During Therapy: [facility] for tasks assessed/performed ?Overall Cognitive Status: Within Functional Limits for tasks assessed ?  ?  ?  ?  ?  ?  ?  ?  ?  ?  ?  ?  ?  ?  ?  ?  ?  ?  ?  ?   ?   ?   ?   ? ? ?  Pertinent Vitals/ Pain       Pain Assessment ?Pain Assessment: No/denies pain ? ? ?Frequency ? Min 2X/week  ? ? ? ? ?  ?Progress Toward Goals ? ?OT Goals(current goals can now be found in the care plan section) ? Progress towards OT goals: Progressing toward goals ? ?Acute Rehab OT Goals ?Patient Stated Goal: to go to rehab ?OT Goal Formulation: With patient/family ?Time For Goal Achievement: 01/23/22 ?Potential to Achieve Goals: Good ?ADL Goals ?Pt Will Perform Grooming: standing;with modified independence ?Pt Will  Perform Lower Body Dressing: with supervision;sit to/from stand ?Pt Will Transfer to Toilet: with supervision;ambulating;regular height toilet  ?Plan Discharge plan remains appropriate;Frequency remains appropriate   ? ?Co-evaluation ? ? ?   ?  ?  ?  ?  ? ?  ?AM-PAC OT "6 Clicks" Daily Activity     ?Outcome Measure ? ? Help from another person eating meals?: None ?Help from another person taking care of personal grooming?: A Little ?Help from another person toileting, which includes using toliet, bedpan, or urinal?: A Lot ?Help from another person bathing (including washing, rinsing, drying)?: A Lot ?Help from another person to put on and taking off regular upper body clothing?: A Little ?Help from another person to put on and taking off regular lower body clothing?: A Lot ?6 Click Score: 16 ? ?  ?End of Session Equipment Utilized During Treatment: Rolling walker (2 wheels);Oxygen ? ?OT Visit Diagnosis: Unsteadiness on feet (R26.81);Muscle weakness (generalized) (M62.81);History of falling (Z91.81) ?  ?Activity Tolerance Patient tolerated treatment well ?  ?Patient Left in bed;with call bell/phone within reach;with bed alarm set;with family/visitor present ?  ?Nurse Communication Mobility status ?  ? ?   ? ?Time: 5956-3875 ?OT Time Calculation (min): 27 min ? ?Charges: OT General Charges ?$OT Visit: 1 Visit ?OT Treatments ?$Self Care/Home Management : 23-37 mins ? ?Dessie Coma, M.S. OTR/L  ?01/13/22, 4:34 PM  ?ascom [phone] ? ?

## 2022-01-13 NOTE — Progress Notes (Signed)
Progress Note   Patient: Luis [name]. TGG:[id number] DOB: [birth year] DOA: 01/08/2022     5 DOS: the patient was seen and examined on 01/13/2022   Brief hospital course:  76 y.o. male with  hx of CAD S/P CABG, history of multiple myeloma, diabetes mellitus with stage III chronic kidney disease iiB- CREAT ~ 1.7-1.9 ( FEB 2023), hypertension, anxiety and depression, recent hospitalization for MSSA sepsis from skin and soft tissue infection (completed IV antibiotic therapy with cefazolin on 12/31/21) presented for evaluation of altered mental status from nursing facility.  Patient was also reporting very weak. In the ED, patient is awake alert oriented to person place but not to time, reported having cough occasional productive with yellow phlegm.  Vital stable afebrile, labs with CKD creatinine slightly elevated from baseline 2.0, AST 58 lactic acid in low 2s, anemia hemoglobin 10.2 g UA no evidence of UTI CT/MRI head chest x-ray -no acute finding.  Ammonia level at 47 total bili 2.4, placed on lactulose and admitted for further management.  3/6: Overnight patient became hypotensive and received 1 L of IV bolus.  Also given an extra dose of midodrine.  Received morning torsemide and spironolactone before nursing staff was told to hold them due to mild worsening of creatinine.  Blood pressure remained borderline, giving 1 L of LR. Midodrine dose was increased to 10 mg 3 times a day. PT/OT is recommending rehab. Apparently only 3 days left for rehab.   Does not meet the criteria for inpatient hospice services as requested by son.  Most likely will be going back to [facility] with hospice. Also ordered abdominal ultrasound to see the amount of ascites, will consider paracentesis if needed.  3/7: Patient underwent another paracentesis with removal of 4 L of transudative fluid.  Feeling much relieved after the procedure.  He was quite somnolent but easily arousable, able to answer questions appropriately.   Denies any pain. Renal function with some worsening, lactic acid remained elevated at 2.2, concern of hepatorenal. Also giving some more fluid.  Diuretics were discontinued. Family is looking for a SNF with hospice.  3/8: Clinical condition remain same, patient and family decided to proceed with home hospice, he will be going to a facility with hospice services. Son and patient were concerned about needing frequent paracentesis as he becomes very uncomfortable with a small collection of ascites. Talked with IR-they will place a Pleurx catheter tomorrow and hospice can take care of it while he was at facility.  Patient will need as needed removal of fluid.  3 to 4 L at time is more safe for based on his borderline blood pressure. Patient is unable to tolerate any diuretics due to softer blood pressure and frequent AKI's with his CKD.   Assessment and Plan: * Hepatic encephalopathy Acute metabolic encephalopathy Generalized weakness/debility: Improved, now appear at his baseline. Patient presenting with confusion worse from baseline along with generalized weakness, work-up so far unremarkable for UTI pneumonia MRI brain and CT head no acute finding.  Likely multifactorial in the setting of hepatic encephalopathy. -PT/OT is recommending SNF-patient has only 3 days left. -Family made arrangement to see it with hospice services at a facility. -Should be able to go tomorrow after Pleurx catheter placement -TOC is working on it.  Acute metabolic encephalopathy See above  Cirrhosis ([place]) Alcoholic liver cirrhosis with ascites with recent paracentesis: See above, mildly distended abdomen.  His home dose of torsemide and spironolactone was resumed but resulted in worsening  Renal function with mild worsening and lactic acid remained elevated. °Patient underwent another paracentesis with removal of 4 L of transudative fluid. °Patient did receive empiric ceftriaxone which was discontinued  on 01/11/2022 as there was no sign of infection. °-Keep holding torsemide and spironolactone. °-Concern of hepatorenal. °-Giving some IV fluid °-Monitor lactic acid and renal function ° ° °Lactic acidosis °Resolved. °Likely from decreased clearance with her liver cirrhosis. °-Keep encouraging p.o. hydration ° °Stage 3b chronic kidney disease (CKD) (HCC) °Creatinine started improving, now around baseline.  Baseline around 1.9-1.8. °Patient was restarted on diuretic and developed hypotension. °Concern of hepatorenal. °-Patient is unable to tolerate diuretics with development of hypotension and AKI. °-Monitor renal function ° °CAD (coronary artery disease) of artery bypass graft °No chest pain continue on aspirin and statin not on beta-blocker due to hypotension. On torsemide and Aldactone with liver cirrhosis-hopefully can resume. ° °Chronic diastolic CHF (congestive heart failure) (HCC) °Volume status is stable.  Monitor closely intake output Daily weight. ° °Depression with anxiety °Mood stable continue as multiple regimen with bedtime Seroquel, Paxil, Xanax ° °Generalized weakness °PT/OT are recommending SNF. °-TOC is working on it, family is willing to go with copayment days. ° °Multiple myeloma (HCC) °History, labs stable.  Outpatient follow-up advised ° °Type 2 diabetes mellitus without complication, without long-term current use of insulin (HCC) °Blood sugar is stable °-continue to hold metformin  °-Continue sliding scale insulin with low-carb diet.   ° ° °Obesity (BMI 30-39.9) °BMI 30.9 will benefit with weight loss. ° °Pressure injury of skin °Continue wound care offloading for left buttock wound as below °Pressure Injury 01/08/22 Buttocks Left Stage 1 -  Intact skin with non-blanchable redness of a localized area usually over a bony prominence. reddened (Active)  °01/08/22 1840  °Location: Buttocks  °Location Orientation: Left  °Staging: Stage 1 -  Intact skin with non-blanchable redness of a localized area  usually over a bony prominence.  °Wound Description (Comments): reddened  °Present on Admission: Yes  °   °Pressure Injury 01/08/22 Buttocks Right Stage 1 -  Intact skin with non-blanchable redness of a localized area usually over a bony prominence. reddened (Active)  °01/08/22 1840  °Location: Buttocks  °Location Orientation: Right  °Staging: Stage 1 -  Intact skin with non-blanchable redness of a localized area usually over a bony prominence.  °Wound Description (Comments): reddened  °Present on Admission: Yes  ° ° ° ° ° °Subjective: Patient was seen and examined today.  Son at bedside.  He seems somnolent, ate his breakfast.  Stating that his belly is becoming tight again. °We discussed about the possibility of getting a Pleurx catheter as he is going with hospice services for comfort reasons.  Patient and son both seems interested to avoid frequent paracentesis.  We discussed the increased risk of infection, this seems understanding and would like to proceed. ° °Physical Exam: °Vitals:  ° 01/13/22 0450 01/13/22 0724 01/13/22 0755 01/13/22 1143  °BP:  (!) 121/102 (!) 114/94 (!) 95/46  °Pulse:  69 (!) 58 65  °Resp:  17  15  °Temp:  98 °F (36.7 °C)  97.8 °F (36.6 °C)  °TempSrc:  Oral    °SpO2:  (!) 80% 100% 99%  °Weight: 81 kg     °Height:      ° °General.  Chronically ill-appearing gentleman in no acute distress. °Pulmonary.  Lungs clear bilaterally, normal respiratory effort. °CV.  Regular rate and rhythm, no JVD, rub or murmur. °Abdomen.  Soft, nontender, nondistended, BS   BS positive. CNS.  Somnolent, no focal neurologic deficit. Extremities.  No edema, no cyanosis, pulses intact and symmetrical. Psychiatry.  Judgment and insight appears normal.  Data Reviewed: Prior notes and labs reviewed.  Family Communication: Discussed with son at bedside  Disposition: Status is: Inpatient Remains inpatient appropriate because: Severity of illness   Planned Discharge Destination: Skilled nursing facility and With  hospice  Time spent: 45 minutes  This record has been created using Systems analyst. Errors have been sought and corrected,but may not always be located. Such creation errors do not reflect on the standard of care.  Author: Lorella Nimrod, MD 01/13/2022 3:01 PM  For on call review [email or web address].

## 2022-01-13 NOTE — TOC Progression Note (Signed)
Transition of Care (TOC) - Progression Note  ? ? ?Patient Details  ?Name: Luis [name]. ?MRN: [id number] ?Date of Birth: 1946/08/29 ? ?Transition of Care (TOC) CM/SW Contact  ?Kellyn Mansfield A Latreshia Beauchaine, LCSW ?Phone Number: ?01/13/2022, 2:36 PM ? ?Clinical Narrative:   Per [facility] when admission paperwork is completed by son and sent back they can take pt. CSW printed admission paperwork off for son and provided it at bedside. Son will complete it and CSW will email it back to [employer] at [facility]. Per MD IR will place drain tomorrow and pt can dc tomorrow to SNF after that. Neoma Laming is aware and will be prepared for pt tomorrow. Covid test pending. ? ? ? ?  ?  ? ?Expected Discharge Plan and Services ?  ?  ?  ?  ?  ?                ?  ?  ?  ?  ?  ?  ?  ?  ?  ?  ? ? ?Social Determinants of Health (SDOH) Interventions ?  ? ?Readmission Risk Interventions ?No flowsheet data found. ? ?

## 2022-01-14 ENCOUNTER — Inpatient Hospital Stay: Payer: Medicare Other | Admitting: Radiology

## 2022-01-14 DIAGNOSIS — K7682 Hepatic encephalopathy: Secondary | ICD-10-CM | POA: Diagnosis not present

## 2022-01-14 LAB — GLUCOSE, CAPILLARY
Glucose-Capillary: 97 mg/dL (ref 70–99)
Glucose-Capillary: 86 mg/dL (ref 70–99)

## 2022-01-14 MED ORDER — MIDAZOLAM HCL 2 MG/2ML IJ SOLN
INTRAMUSCULAR | Status: AC | PRN
  Administered 2022-01-14: 1 mg via INTRAVENOUS

## 2022-01-14 MED ORDER — MIDODRINE HCL 10 MG PO TABS: 10.0000 mg | ORAL_TABLET | Freq: Three times a day (TID) | ORAL | Status: AC

## 2022-01-14 MED ORDER — FENTANYL CITRATE (PF) 100 MCG/2ML IJ SOLN
INTRAMUSCULAR | Status: AC
  Filled 2022-01-14: qty 2

## 2022-01-14 MED ORDER — MIDAZOLAM HCL 2 MG/2ML IJ SOLN
INTRAMUSCULAR | Status: AC
  Filled 2022-01-14: qty 2

## 2022-01-14 MED ORDER — LACTULOSE 10 GM/15ML PO SOLN: 30.0000 g | Freq: Three times a day (TID) | ORAL | 0 refills | Status: AC

## 2022-01-14 MED ORDER — FENTANYL CITRATE (PF) 100 MCG/2ML IJ SOLN
INTRAMUSCULAR | Status: AC | PRN
  Administered 2022-01-14: 50 ug via INTRAVENOUS

## 2022-01-14 MED ORDER — ALPRAZOLAM 0.25 MG PO TABS: 0.2500 mg | ORAL_TABLET | Freq: Every evening | ORAL | 0 refills | Status: AC | PRN

## 2022-01-14 MED ORDER — SODIUM CHLORIDE 0.9 % IV SOLN
INTRAVENOUS | Status: AC | PRN
  Administered 2022-01-14: 10 mL/h via INTRAVENOUS

## 2022-01-14 MED ORDER — LIDOCAINE HCL 1 % IJ SOLN
INTRAMUSCULAR | Status: AC
  Administered 2022-01-14: 12:00:00 18 mL
  Filled 2022-01-14: qty 20

## 2022-01-14 NOTE — Progress Notes (Signed)
?   01/14/22 1020  ?Clinical Encounter Type  ?Visited With Patient;Other (Comment) ?(notary, witnesses)  ? ?Chaplain Burris returned to assist Pt in notarizing his AD. Pt was in process of being re-located for some pre-procedure testing but we were able to pause long enough to permit Pt to sign his documents and for notarization to be complete. Chaplain Burris informed Pt she would return later with copies. Will f/u later 3/9. ?

## 2022-01-14 NOTE — Procedures (Signed)
Interventional Radiology Procedure Note ? ?Date of Procedure: 01/14/2022  ?Procedure: Tunneled peritoneal drainage catheter placement  ? ?Findings:  ?1. Tunneled peritoneal drainage catheter placement  ?2. 1L ascites removed after placement    ? ?Complications: No immediate complications noted.  ? ?Estimated Blood Loss: minimal ? ?Follow-up and Recommendations: ?1. Ready for use  ? ? ?Albin Felling, MD  ?Vascular & Interventional Radiology  ?01/14/2022 2:46 PM ? ? ? ?

## 2022-01-14 NOTE — TOC Transition Note (Signed)
Transition of Care (TOC) - CM/SW Discharge Note ? ? ?Patient Details  ?Name: Luis [name]. ?MRN: [id number] ?Date of Birth: 05/24/1946 ? ?Transition of Care (TOC) CM/SW Contact:  ?Arless Vineyard A Kalynn Declercq, LCSW ?Phone Number: ?01/14/2022, 2:34 PM ? ? ?Clinical Narrative:   Clinical Social Worker facilitated patient discharge including contacting patient family and facility to confirm patient discharge plans.  Clinical information faxed to facility and family agreeable with plan.  CSW arranged ambulance transport via [facility] to [facility] room 320.  RN to call [phone] for report prior to discharge. ? ? ? ? ? ?Final next level of care: [place] ?Barriers to Discharge: No Barriers Identified ? ? ?Patient Goals and CMS Choice ?  ?  ?  ? ?Discharge Placement ?  ?           ?Patient [facility] bed at:  [facility]) ?Patient to be transferred to facility by: [facility] ?  ?Patient and family notified of of transfer: 01/14/22 ? ?Discharge Plan and Services ?  ?  ?           ?  ?  ?  ?  ?  ?  ?  ?  ?  ?  ? ?Social Determinants of Health (SDOH) Interventions ?  ? ? ?Readmission Risk Interventions ?No flowsheet data found. ? ? ? ? ?

## 2022-01-14 NOTE — Sedation Documentation (Signed)
Ancef 2 GM IVPB ordered on MAR: Dr. Denna Haggard asked re: med & MD states "we don't really need it for this."  ?

## 2022-01-14 NOTE — Progress Notes (Addendum)
?   01/14/22 1400  ?Clinical Encounter Type  ?Visited With Patient  ?Visit Type Spiritual support;Social support  ? ?Chaplain Burris returned to finish providing copies of AD and placing paper copy in Pt's chart. Upon returning documents to room, Pt shared that he was feeling depressed due to discussion of his condition. Chaplain Burris offered compassionate presence and normalization of emotions. Chaplain listened to Pt concerns and offered support. Chaplain reaffirmed Pt's life and sought to help Pt feel able to persevere and value this day. Chaplain offered f/u care as well. ?

## 2022-01-14 NOTE — Progress Notes (Signed)
[facility] ([facility])  ? ?Per epic chat, plan is for patient to DC to [facility] following cath. placement. [facility] to follow upon DC. ?  ?Upon discharge, please send signed and completed DNR with patient/family upon discharge. Please provide prescriptions at discharge as needed to ensure ongoing symptom management and a transport packet. ?  ?[facility] information and contact numbers given to family and above information shared with TOC.  ?  ?Please call with any questions/concerns.  ?  ?Thank you for the opportunity to participate in this patient's care ?  ?Daphene Calamity, MSW ?[facility] Liaison  ?[phone] ?  ? ?

## 2022-01-14 NOTE — Progress Notes (Signed)
?   01/14/22 0940  ?Clinical Encounter Type  ?Visited With Patient  ?Visit Type Initial;Social support  ?Referral From Chaplain  ?Consult/Referral To Chaplain  ?Spiritual Encounters  ?Spiritual Needs Other (Comment) ?(notarize AD)  ? ?Chaplain Burris f/u from previous chaplain to assist in notarizing AD> Chaplain Burrs spent time with Pt at bedside to offer pastoral presence as well as to assess ability to authorize document. ? ?[employer] provided compassionate, non-anxious presence. Pt shared much of his history and described his family. Pt shared that he has a traditional background in [employer] but not an actively religious or spiritual person. Pt is a widowes who was married 24yr; four grandchildren and two adult sons who are named on his advanced care planning. Pt did not express any distress or further needs at this time. Chaplain ended visit when physician arrived; notified Pt she would return with notary and witnesses prior to planned procedure. ?

## 2022-01-14 NOTE — Discharge Summary (Signed)
Physician Discharge Summary   Patient: Luis [name]. MRN: [id number] DOB: Feb 09, 1946  Admit date:     01/08/2022  Discharge date: 01/14/22  Discharge Physician: Lorella Nimrod   PCP: Tracie Harrier, MD   Recommendations at discharge:  1.  Patient needs supplies and care for Pleurx catheter placed in his belly to avoid recurrent paracentesis, please follow the directions. 2.  Patient will be high risk for any intra-abdominal infection-please monitor closely. 3.  Follow-up with gastroenterology and primary care provider for further recommendations.  Discharge Diagnoses: Principal Problem:   Hepatic encephalopathy Active Problems:   Cirrhosis (HCC)   Acute metabolic encephalopathy   Lactic acidosis   Stage 3b chronic kidney disease (CKD) (HCC)   CAD (coronary artery disease) of artery bypass graft   Chronic diastolic CHF (congestive heart failure) (HCC)   Depression with anxiety   Generalized weakness   Multiple myeloma (HCC)   Type 2 diabetes mellitus without complication, without long-term current use of insulin (HCC)   Obesity (BMI 30-39.9)   Pressure injury of skin   Hospital Course:  76 y.o. male with  hx of CAD S/P CABG, history of multiple myeloma, diabetes mellitus with stage III chronic kidney disease iiB- CREAT ~ 1.7-1.9 ( FEB 2023), hypertension, anxiety and depression, recent hospitalization for MSSA sepsis from skin and soft tissue infection (completed IV antibiotic therapy with cefazolin on 12/31/21) presented for evaluation of altered mental status from nursing facility.  Patient was also reporting very weak. In the ED, patient is awake alert oriented to person place but not to time, reported having cough occasional productive with yellow phlegm.  Vital stable afebrile, labs with CKD creatinine slightly elevated from baseline 2.0, AST 58 lactic acid in low 2s, anemia hemoglobin 10.2 g UA no evidence of UTI CT/MRI head chest x-ray -no acute finding.  Ammonia level at  47 total bili 2.4, placed on lactulose and admitted for further management.  3/6: Overnight patient became hypotensive and received 1 L of IV bolus.  Also given an extra dose of midodrine.  Received morning torsemide and spironolactone before nursing staff was told to hold them due to mild worsening of creatinine.  Blood pressure remained borderline, giving 1 L of LR. Midodrine dose was increased to 10 mg 3 times a day. PT/OT is recommending rehab. Apparently only 3 days left for rehab.   Does not meet the criteria for inpatient hospice services as requested by son.  Most likely will be going back to [facility] with hospice. Also ordered abdominal ultrasound to see the amount of ascites, will consider paracentesis if needed.  3/7: Patient underwent another paracentesis with removal of 4 L of transudative fluid.  Feeling much relieved after the procedure.  He was quite somnolent but easily arousable, able to answer questions appropriately.  Denies any pain. Renal function with some worsening, lactic acid remained elevated at 2.2, concern of hepatorenal. Also giving some more fluid.  Diuretics were discontinued. Family is looking for a SNF with hospice.  3/8: Clinical condition remain same, patient and family decided to proceed with home hospice, he will be going to a facility with hospice services. Son and patient were concerned about needing frequent paracentesis as he becomes very uncomfortable with a small collection of ascites.  Pleurx catheter was placed by IR on 01/14/2022 and hospice can take care of it while he was at facility.  Patient will need as needed removal of fluid.  3 to 4 L at time is more  safe for based on his borderline blood pressure.  Patient is unable to tolerate any diuretics due to softer blood pressure and frequent AKI's with his CKD.  Patient is being discharged to a skilled nursing facility along with hospice care. He will continue rest of his home medications except  mentioned above. He will follow-up with his providers for further recommendations.    Assessment and Plan: * Hepatic encephalopathy Acute metabolic encephalopathy Generalized weakness/debility: Improved, now appear at his baseline. Patient presenting with confusion worse from baseline along with generalized weakness, work-up so far unremarkable for UTI pneumonia MRI brain and CT head no acute finding.  Likely multifactorial in the setting of hepatic encephalopathy. -PT/OT is recommending SNF-patient has only 3 days left. -Family made arrangement to see it with hospice services at a facility. -Should be able to go tomorrow after Pleurx catheter placement -TOC is working on it.  Acute metabolic encephalopathy See above  Cirrhosis ([place]) Alcoholic liver cirrhosis with ascites with recent paracentesis: See above, mildly distended abdomen.  His home dose of torsemide and spironolactone was resumed but resulted in worsening hypotension. Renal function with mild worsening and lactic acid remained elevated. Patient underwent another paracentesis with removal of 4 L of transudative fluid. Patient did receive empiric ceftriaxone which was discontinued on 01/11/2022 as there was no sign of infection. -Keep holding torsemide and spironolactone. -Concern of hepatorenal. -Giving some IV fluid -Monitor lactic acid and renal function   Lactic acidosis Resolved. Likely from decreased clearance with her liver cirrhosis. -Keep encouraging p.o. hydration  Stage 3b chronic kidney disease (CKD) ([place]) Creatinine started improving, now around baseline.  Baseline around 1.9-1.8. Patient was restarted on diuretic and developed hypotension. Concern of hepatorenal. -Patient is unable to tolerate diuretics with development of hypotension and AKI. -Monitor renal function  CAD (coronary artery disease) of artery bypass graft No chest pain continue on aspirin and statin not on beta-blocker due to  hypotension. On torsemide and Aldactone with liver cirrhosis-hopefully can resume.  Chronic diastolic CHF (congestive heart failure) (HCC) Volume status is stable.  Monitor closely intake output Daily weight.  Depression with anxiety Mood stable continue as multiple regimen with bedtime Seroquel, Paxil, Xanax  Generalized weakness PT/OT are recommending SNF. -TOC is working on it, family is willing to go with copayment days.  Multiple myeloma (HCC) History, labs stable.  Outpatient follow-up advised  Type 2 diabetes mellitus without complication, without long-term current use of insulin (HCC) Blood sugar is stable -continue to hold metformin  -Continue sliding scale insulin with low-carb diet.     Obesity (BMI 30-39.9) BMI 30.9 will benefit with weight loss.  Pressure injury of skin Continue wound care offloading for left buttock wound as below Pressure Injury 01/08/22 Buttocks Left Stage 1 -  Intact skin with non-blanchable redness of a localized area usually over a bony prominence. reddened (Active)  01/08/22 1840  Location: Buttocks  Location Orientation: Left  Staging: Stage 1 -  Intact skin with non-blanchable redness of a localized area usually over a bony prominence.  Wound Description (Comments): reddened  Present on Admission: Yes     Pressure Injury 01/08/22 Buttocks Right Stage 1 -  Intact skin with non-blanchable redness of a localized area usually over a bony prominence. reddened (Active)  01/08/22 1840  Location: Buttocks  Location Orientation: Right  Staging: Stage 1 -  Intact skin with non-blanchable redness of a localized area usually over a bony prominence.  Wound Description (Comments): reddened  Present on Admission: Yes  Consultants: None Procedures performed: Paracentesis, Pleurx catheter placement and abdomen by IR Disposition: Skilled nursing facility with home hospice services. Diet recommendation:  Cardiac and Carb modified  diet DISCHARGE MEDICATION: Allergies as of 01/14/2022       Reactions   Pravastatin Sodium Other (See Comments)   Statins Other (See Comments)   Muscle aches   Zocor [simvastatin] Other (See Comments)   Muscle aches        Medication List     STOP taking these medications    spironolactone 25 MG tablet Commonly known as: ALDACTONE   torsemide 20 MG tablet Commonly known as: DEMADEX       TAKE these medications    (feeding supplement) PROSource Plus liquid Take 30 mLs by mouth 3 (three) times daily between meals.   feeding supplement Liqd Take 237 mLs by mouth 2 (two) times daily between meals.   acetaminophen 325 MG tablet Commonly known as: TYLENOL Take 2 tablets (650 mg total) by mouth every 6 (six) hours as needed for mild pain (or Fever >/= 101).   ALPRAZolam 0.25 MG tablet Commonly known as: XANAX Take 1 tablet (0.25 mg total) by mouth at bedtime as needed for sleep. What changed:  reasons to take this additional instructions   aspirin 81 MG tablet Take 81 mg by mouth daily.   Bee Pollen 500 MG Chew Chew 1 tablet by mouth 2 (two) times daily.   Calcium Carbonate-Vitamin D 600-400 MG-UNIT chew tablet Chew 1 tablet by mouth 2 (two) times daily.   ipratropium-albuterol 0.5-2.5 (3) MG/3ML Soln Commonly known as: DUONEB Take 3 mLs by nebulization every 6 (six) hours as needed.   lactulose 10 GM/15ML solution Commonly known as: CHRONULAC Take 45 mLs (30 g total) by mouth 3 (three) times daily. Titrate for 2-3 soft bowel movements daily   loratadine 10 MG tablet Commonly known as: CLARITIN Take 1 tablet (10 mg total) by mouth daily.   lovastatin 40 MG tablet Commonly known as: MEVACOR Take 40 mg by mouth at bedtime.   metFORMIN 500 MG tablet Commonly known as: GLUCOPHAGE Take 500 mg by mouth daily with breakfast.   midodrine 10 MG tablet Commonly known as: PROAMATINE Take 1 tablet (10 mg total) by mouth 3 (three) times daily with meals. What  changed:  medication strength how much to take   multivitamin with minerals Tabs tablet Take 1 tablet by mouth daily.   omeprazole 20 MG capsule Commonly known as: PRILOSEC Take 20 mg by mouth daily.   PARoxetine 10 MG tablet Commonly known as: PAXIL Take 10 mg by mouth daily.   QUEtiapine 25 MG tablet Commonly known as: SEROQUEL Take 25 mg by mouth at bedtime.   tamsulosin 0.4 MG Caps capsule Commonly known as: FLOMAX Take 0.4 mg by mouth daily.   Ventolin HFA 108 (90 Base) MCG/ACT inhaler Generic drug: albuterol Inhale 2 puffs into the lungs every 6 (six) hours as needed for wheezing.               Discharge Care Instructions  (From admission, onward)           Start     Ordered   01/14/22 0000  No dressing needed        01/14/22 1219            Follow-up Information     Tracie Harrier, MD. Schedule an appointment as soon as possible for a visit in 1 week(s).   Specialty: Internal Medicine Contact information:  [place] [address] [phone]                Discharge Exam: Danley Danker Weights   01/12/22 0419 01/13/22 0450 01/14/22 0500  Weight: 81.6 kg 81 kg 79.8 kg   General.  Well-developed elderly man, in no acute distress. Pulmonary.  Lungs clear bilaterally, normal respiratory effort. CV.  Regular rate and rhythm, no JVD, rub or murmur. Abdomen.  Soft, nontender, nondistended, BS positive. CNS.  Alert and oriented x3.  No focal neurologic deficit. Extremities.  No edema, no cyanosis, pulses intact and symmetrical. Psychiatry.  Judgment and insight appears normal.   Condition at discharge: stable  The results of significant diagnostics from this hospitalization (including imaging, microbiology, ancillary and laboratory) are listed below for reference.   Imaging Studies: CT ABDOMEN PELVIS WO CONTRAST  Result Date: 12/27/2021 CLINICAL DATA:  Abdominal pain for 1 week, ascites, history of multiple myeloma  EXAM: CT ABDOMEN AND PELVIS WITHOUT CONTRAST TECHNIQUE: Multidetector CT imaging of the abdomen and pelvis was performed following the standard protocol without IV contrast. RADIATION DOSE REDUCTION: This exam was performed according to the departmental dose-optimization program which includes automated exposure control, adjustment of the mA and/or kV according to patient size and/or use of iterative reconstruction technique. COMPARISON:  CT abdomen pelvis, 07/15/2015, CT lumbar spine, 11/07/2020 FINDINGS: Lower chest: Small, chronic, loculated appearing bilateral pleural effusion with associated round atelectasis and scarring. Coronary artery calcifications. Hepatobiliary: No solid liver abnormality is seen. Coarse, nodular cirrhotic morphology of the liver. Gallstone contracted in the gallbladder. No biliary dilatation. Pancreas: Unremarkable. No pancreatic ductal dilatation or surrounding inflammatory changes. Spleen: Normal in size without significant abnormality. Adrenals/Urinary Tract: Adrenal glands are unremarkable. Kidneys are normal, without renal calculi, solid lesion, or hydronephrosis. Bladder is unremarkable. Stomach/Bowel: Stomach is within normal limits. Appendix is not clearly visualized. Wall thickening and mural fatty stratification of the cecum and proximal transverse colon (series 2, image 55). Descending and sigmoid diverticulosis. Vascular/Lymphatic: Aortic atherosclerosis. No enlarged abdominal or pelvic lymph nodes. Reproductive: No mass or other significant abnormality. Other: Large, ascites fluid containing right-sided spigelian hernia (series 2, image 70). Small, fat and fluid containing bilateral inguinal hernias. Large volume ascites throughout the abdomen and pelvis. Anasarca. Musculoskeletal: No acute osseous findings. Unchanged wedge deformity of the T11 vertebral body. Numerous lytic lesions throughout the spine and pelvis (series 6, image 85, series 2, image 63). Status post right  hip total arthroplasty. IMPRESSION: 1. Large volume ascites throughout the abdomen and pelvis. 2. Cirrhosis. 3. Wall thickening and mural fatty stratification of the cecum and proximal transverse colon, likely portal colopathy in the setting of ascites and portal hypertension. 4. Gallstones contracted in the gallbladder. 5. Numerous lytic lesions throughout the spine and pelvis, consistent with history of multiple myeloma and unchanged compared to prior imaging of the lumbar spine dated 11/07/2020. 6. Coronary artery disease. Aortic Atherosclerosis (ICD10-I70.0). Electronically Signed   By: Delanna Ahmadi M.D.   On: 12/27/2021 16:11   DG Chest 1 View  Result Date: 12/28/2021 CLINICAL DATA:  Check PICC line placement. EXAM: CHEST  1 VIEW COMPARISON:  December 14, 2021 FINDINGS: Multiple sternal wires are seen. A right-sided PICC line is noted with its distal tip overlying the proximal to midportion of the superior vena cava. This is at the level of the aortic arch. Mild, chronic appearing increased lung markings are seen with ill-defined areas of bilateral scarring and/or atelectasis. Left apical pleural thickening is noted. There is no evidence  of a pleural effusion or pneumothorax. The heart size and mediastinal contours are within normal limits. Marked severity calcification of the aortic arch is seen. There are several chronic left-sided rib fractures. Multilevel degenerative changes are noted throughout the thoracic spine. IMPRESSION: Right-sided PICC line positioning, as described above Electronically Signed   By: Virgina Norfolk M.D.   On: 12/28/2021 02:24   CT HEAD WO CONTRAST (5MM)  Result Date: 01/08/2022 CLINICAL DATA:  Mental status change EXAM: CT HEAD WITHOUT CONTRAST TECHNIQUE: Contiguous axial images were obtained from the base of the skull through the vertex without intravenous contrast. RADIATION DOSE REDUCTION: This exam was performed according to the departmental dose-optimization program  which includes automated exposure control, adjustment of the mA and/or kV according to patient size and/or use of iterative reconstruction technique. COMPARISON:  12/01/2021 FINDINGS: Brain: Mild atrophy. No evidence of acute infarction, hemorrhage, hydrocephalus, extra-axial collection or mass lesion/mass effect. Vascular: Atherosclerotic and physiologic intracranial calcifications. Skull: Normal. Negative for fracture or focal lesion. Sinuses/Orbits: No acute finding. Other: Decrease in right frontal scalp soft tissue swelling. IMPRESSION: Negative for bleed orother acute intracranial process Electronically Signed   By: Lucrezia Europe M.D.   On: 01/08/2022 09:55   MR BRAIN WO CONTRAST  Result Date: 01/08/2022 CLINICAL DATA:  Transient ischemic attack. EXAM: MRI HEAD WITHOUT CONTRAST TECHNIQUE: Multiplanar, multiecho pulse sequences of the brain and surrounding structures were obtained without intravenous contrast. COMPARISON:  Head CT January 08, 2022 FINDINGS: The study is degraded by motion and patient positioning in the scanner. Brain: No acute infarction, hemorrhage, hydrocephalus, extra-axial collection or mass lesion. Scattered foci of T2 hyperintensity are seen within the white matter of the cerebral hemispheres, nonspecific, most likely related to chronic small vessel ischemia. Vascular: Normal flow voids. Skull and upper cervical spine: Normal marrow signal. Sinuses/Orbits: Negative. Other: None. IMPRESSION: 1. No acute intracranial abnormality. 2. Mild chronic microvascular ischemic changes of the white matter. Electronically Signed   By: Pedro Earls M.D.   On: 01/08/2022 12:55   US Abdomen Limited  Result Date: 01/11/2022 CLINICAL DATA:  Ascites EXAM: LIMITED ABDOMEN ULTRASOUND FOR ASCITES TECHNIQUE: Limited ultrasound survey for ascites was performed in all four abdominal quadrants. COMPARISON:  CT 12/27/2021 FINDINGS: There is ascites present in the right upper, right lower, and left  lower quadrants. IMPRESSION: Abdominal ascites present in the right upper, right lower, and left lower quadrants. Electronically Signed   By: Maurine Simmering M.D.   On: 01/11/2022 16:36   US Paracentesis  Result Date: 01/12/2022 INDICATION: Ascites, request received for diagnostic and therapeutic paracentesis. EXAM: ULTRASOUND GUIDED PARACENTESIS MEDICATIONS: Lidocaine 1% local only. COMPLICATIONS: None immediate. PROCEDURE: Informed written consent was obtained from the patient after a discussion of the risks, benefits and alternatives to treatment. A timeout was performed prior to the initiation of the procedure. Initial ultrasound scanning demonstrates a large amount of ascites within the right lower abdominal quadrant. The right lower abdomen was prepped and draped in the usual sterile fashion. 1% lidocaine was used for local anesthesia. Following this, a 19 gauge, 7-cm, Yueh catheter was introduced. An ultrasound image was saved for documentation purposes. The paracentesis was performed. The catheter was removed and a dressing was applied. The patient tolerated the procedure well without immediate post procedural complication. Patient received post-procedure intravenous albumin; see nursing notes for details. FINDINGS: A total of approximately 4 Liter of clear yellow fluid was removed. Samples were sent to the laboratory as requested by the clinical team. IMPRESSION:  Successful ultrasound-guided paracentesis yielding 4 liters of peritoneal fluid. This exam was performed by Tsosie Billing PA-C, and was supervised and interpreted by Dr. Pascal Lux. Electronically Signed   By: Sandi Mariscal M.D.   On: 01/12/2022 12:57   DG Chest Port 1 View  Result Date: 01/08/2022 CLINICAL DATA:  Questionable sepsis - evaluate for abnormality EXAM: PORTABLE CHEST 1 VIEW COMPARISON:  12/28/2021 FINDINGS: Chronic interstitial changes. Chronic trace of pleural effusion. Left basilar atelectasis/scarring. Stable cardiomediastinal contours.  IMPRESSION: Chronic interstitial changes without definite acute abnormality. Electronically Signed   By: Macy Mis M.D.   On: 01/08/2022 08:29   [place] EKG SITE RITE  Result Date: 12/15/2021 If [facility] image not attached, placement could not be confirmed due to current cardiac rhythm.   Microbiology: Results for orders placed or performed during the hospital encounter of 01/08/22  Resp Panel by RT-PCR (Flu A&B, Covid) Nasopharyngeal Swab     Status: None   Collection Time: 01/08/22  8:34 AM   Specimen: Nasopharyngeal Swab; Nasopharyngeal(NP) swabs in vial transport medium  Result Value Ref Range Status   SARS Coronavirus 2 by RT PCR NEGATIVE NEGATIVE Final    Comment: (NOTE) SARS-CoV-2 target nucleic acids are NOT DETECTED.  The SARS-CoV-2 RNA is generally detectable in upper respiratory specimens during the acute phase of infection. The lowest concentration of SARS-CoV-2 viral copies this assay can detect is 138 copies/mL. A negative result does not preclude SARS-Cov-2 infection and should not be used as the sole basis for treatment or other patient management decisions. A negative result may occur with  improper specimen collection/handling, submission of specimen other than nasopharyngeal swab, presence of viral mutation(s) within the areas targeted by this assay, and inadequate number of viral copies(<138 copies/mL). A negative result must be combined with clinical observations, patient history, and epidemiological information. The expected result is Negative.  Fact Sheet for Patients:  EntrepreneurPulse.com.au  Fact Sheet for Healthcare Providers:  IncredibleEmployment.be  This test is no t yet approved or cleared by the [place] FDA and  has been authorized for detection and/or diagnosis of SARS-CoV-2 by FDA under an Emergency Use Authorization (EUA). This EUA will remain  in effect (meaning this test can be used) for the duration  of the COVID-19 declaration under Section 564(b)(1) of the Act, 21 U.S.C.section 360bbb-3(b)(1), unless the authorization is terminated  or revoked sooner.       Influenza A by PCR NEGATIVE NEGATIVE Final   Influenza B by PCR NEGATIVE NEGATIVE Final    Comment: (NOTE) The Xpert Xpress SARS-CoV-2/FLU/RSV plus assay is intended as an aid in the diagnosis of influenza from Nasopharyngeal swab specimens and should not be used as a sole basis for treatment. Nasal washings and aspirates are unacceptable for Xpert Xpress SARS-CoV-2/FLU/RSV testing.  Fact Sheet for Patients: EntrepreneurPulse.com.au  Fact Sheet for Healthcare Providers: IncredibleEmployment.be  This test is not yet approved or cleared by the [place] FDA and has been authorized for detection and/or diagnosis of SARS-CoV-2 by FDA under an Emergency Use Authorization (EUA). This EUA will remain in effect (meaning this test can be used) for the duration of the COVID-19 declaration under Section 564(b)(1) of the Act, 21 U.S.C. section 360bbb-3(b)(1), unless the authorization is terminated or revoked.  Performed at [facility], [address] [place], [place] [address]   Blood Culture (routine x 2)     Status: None   Collection Time: 01/08/22  8:34 AM   Specimen: BLOOD  Result Value Ref Range  Status   Specimen Description BLOOD LEFT ANTECUBITAL  Final   Special Requests   Final    BOTTLES DRAWN AEROBIC AND ANAEROBIC Blood Culture results may not be optimal due to an inadequate volume of blood received in culture bottles   Culture   Final    NO GROWTH 5 DAYS Performed at [facility], [address] [place], [place] [address]    Report Status 01/13/2022 FINAL  Final  Blood Culture (routine x 2)     Status: None   Collection Time: 01/08/22  7:28 PM   Specimen: BLOOD  Result Value Ref Range Status   Specimen Description BLOOD BLOOD LEFT WRIST  Final    Special Requests   Final    BOTTLES DRAWN AEROBIC AND ANAEROBIC Blood Culture adequate volume   Culture   Final    NO GROWTH 5 DAYS Performed at [facility], [address], [place], [place] [address]    Report Status 01/13/2022 FINAL  Final  Body fluid culture w Gram Stain     Status: None (Preliminary result)   Collection Time: 01/12/22 10:02 AM   Specimen: PATH Cytology Peritoneal fluid  Result Value Ref Range Status   Specimen Description   Final    PERITONEAL Performed at [facility], [address] [place], [place] [address]    Special Requests   Final    NONE Performed at [facility], [address] [place], [place] [address]    Gram Stain   Final    WBC PRESENT,BOTH PMN AND MONONUCLEAR NO ORGANISMS SEEN CYTOSPIN SMEAR    Culture   Final    NO GROWTH 2 DAYS Performed at [facility], [place] [address] [place], [place] [address]    Report Status PENDING  Incomplete  Resp Panel by RT-PCR (Flu A&B, Covid) Nasopharyngeal Swab     Status: None   Collection Time: 01/13/22  1:30 PM   Specimen: Nasopharyngeal Swab; Nasopharyngeal(NP) swabs in vial transport medium  Result Value Ref Range Status   SARS Coronavirus 2 by RT PCR NEGATIVE NEGATIVE Final    Comment: (NOTE) SARS-CoV-2 target nucleic acids are NOT DETECTED.  The SARS-CoV-2 RNA is generally detectable in upper respiratory specimens during the acute phase of infection. The lowest concentration of SARS-CoV-2 viral copies this assay can detect is 138 copies/mL. A negative result does not preclude SARS-Cov-2 infection and should not be used as the sole basis for treatment or other patient management decisions. A negative result may occur with  improper specimen collection/handling, submission of specimen other than nasopharyngeal swab, presence of viral mutation(s) within the areas targeted by this assay, and inadequate number of viral copies(<138 copies/mL). A negative result  must be combined with clinical observations, patient history, and epidemiological information. The expected result is Negative.  Fact Sheet for Patients:  EntrepreneurPulse.com.au  Fact Sheet for Healthcare Providers:  IncredibleEmployment.be  This test is no t yet approved or cleared by the [place] FDA and  has been authorized for detection and/or diagnosis of SARS-CoV-2 by FDA under an Emergency Use Authorization (EUA). This EUA will remain  in effect (meaning this test can be used) for the duration of the COVID-19 declaration under Section 564(b)(1) of the Act, 21 U.S.C.section 360bbb-3(b)(1), unless the authorization is terminated  or revoked sooner.       Influenza A by PCR NEGATIVE NEGATIVE Final   Influenza B by PCR NEGATIVE NEGATIVE Final    Comment: (NOTE) The Xpert Xpress  SARS-CoV-2/FLU/RSV plus assay is intended as an aid in the diagnosis of influenza from Nasopharyngeal swab specimens and should not be used as a sole basis for treatment. Nasal washings and aspirates are unacceptable for Xpert Xpress SARS-CoV-2/FLU/RSV testing.  Fact Sheet for Patients: EntrepreneurPulse.com.au  Fact Sheet for Healthcare Providers: IncredibleEmployment.be  This test is not yet approved or cleared by the [place] FDA and has been authorized for detection and/or diagnosis of SARS-CoV-2 by FDA under an Emergency Use Authorization (EUA). This EUA will remain in effect (meaning this test can be used) for the duration of the COVID-19 declaration under Section 564(b)(1) of the Act, 21 U.S.C. section 360bbb-3(b)(1), unless the authorization is terminated or revoked.  Performed at [facility], [address] [place], [place] [address]     Labs: CBC: Recent Labs  Lab 01/08/22 0809 01/10/22 0639  WBC 4.6 4.1  NEUTROABS 3.0 2.2  HGB 10.2* 9.0*  HCT 31.4* 27.4*  MCV 115.4* 116.1*  PLT  178 297*   Basic Metabolic Panel: Recent Labs  Lab 01/08/22 0809 01/09/22 0811 01/11/22 0445 01/12/22 0445 01/13/22 0448  NA 141 146* 139 136 135  K 4.0 3.4* 3.5 3.3* 3.8  CL 106 108 104 102 102  CO2 '24 27 25 24 24  ' GLUCOSE 91 88 97 113* 119*  BUN 49* 43* 44* 42* 42*  CREATININE 2.03* 1.70* 2.05* 2.26* 1.91*  CALCIUM 8.3* 7.9* 7.5* 7.9* 8.1*  MG  --   --  1.2* 1.6*  --   PHOS  --   --  4.7* 4.8*  --    Liver Function Tests: Recent Labs  Lab 01/08/22 0809 01/09/22 0811 01/11/22 0445 01/12/22 0445 01/13/22 0448  AST 58* 40 41  --  36  ALT '5 7 8  ' --  8  ALKPHOS 61 53 47  --  61  BILITOT 2.4* 1.6* 0.6  --  0.7  PROT 8.1 6.9 6.2*  --  6.1*  ALBUMIN 2.6* 2.2* 1.9* 2.0* 2.0*   CBG: Recent Labs  Lab 01/12/22 0823 01/12/22 1202 01/13/22 0434 01/14/22 0512 01/14/22 0731  GLUCAP 106* 111* 119* 97 86    Discharge time spent: greater than 30 minutes.  This record has been created using Systems analyst. Errors have been sought and corrected,but may not always be located. Such creation errors do not reflect on the standard of care.   Signed: Lorella Nimrod, MD [facility] 01/14/2022

## 2022-01-15 LAB — BODY FLUID CULTURE W GRAM STAIN: Culture: NO GROWTH

## 2022-01-26 ENCOUNTER — Inpatient Hospital Stay: Payer: Medicare Other | Attending: Oncology

## 2022-01-26 ENCOUNTER — Ambulatory Visit: Payer: Medicare Other | Admitting: Oncology

## 2022-02-02 ENCOUNTER — Inpatient Hospital Stay: Payer: Medicare Other | Admitting: Oncology

## 2022-02-06 DEATH — deceased
